# Patient Record
Sex: Male | Born: 1950
Health system: Southern US, Community
[De-identification: ages and names within clinical notes are randomized; demographics above are authoritative.]

## PROBLEM LIST (undated history)

## (undated) DIAGNOSIS — E785 Hyperlipidemia, unspecified: Secondary | ICD-10-CM

## (undated) DIAGNOSIS — R931 Abnormal findings on diagnostic imaging of heart and coronary circulation: Secondary | ICD-10-CM

## (undated) DIAGNOSIS — R001 Bradycardia, unspecified: Secondary | ICD-10-CM

## (undated) DIAGNOSIS — N529 Male erectile dysfunction, unspecified: Secondary | ICD-10-CM

## (undated) DIAGNOSIS — I5022 Chronic systolic (congestive) heart failure: Secondary | ICD-10-CM

## (undated) DIAGNOSIS — I4892 Unspecified atrial flutter: Secondary | ICD-10-CM

## (undated) DIAGNOSIS — I213 ST elevation (STEMI) myocardial infarction of unspecified site: Secondary | ICD-10-CM

## (undated) DIAGNOSIS — I480 Paroxysmal atrial fibrillation: Secondary | ICD-10-CM

## (undated) DIAGNOSIS — I2510 Atherosclerotic heart disease of native coronary artery without angina pectoris: Secondary | ICD-10-CM

## (undated) DIAGNOSIS — I6381 Other cerebral infarction due to occlusion or stenosis of small artery: Secondary | ICD-10-CM

## (undated) DIAGNOSIS — N183 Chronic kidney disease, stage 3 unspecified: Secondary | ICD-10-CM

## (undated) DIAGNOSIS — I491 Atrial premature depolarization: Secondary | ICD-10-CM

## (undated) DIAGNOSIS — R918 Other nonspecific abnormal finding of lung field: Secondary | ICD-10-CM

## (undated) DIAGNOSIS — I10 Essential (primary) hypertension: Secondary | ICD-10-CM

## (undated) DIAGNOSIS — I2109 ST elevation (STEMI) myocardial infarction involving other coronary artery of anterior wall: Secondary | ICD-10-CM

## (undated) DIAGNOSIS — G4733 Obstructive sleep apnea (adult) (pediatric): Secondary | ICD-10-CM

## (undated) HISTORY — DX: Male erectile dysfunction, unspecified: N52.9

## (undated) HISTORY — DX: ST elevation (STEMI) myocardial infarction of unspecified site: I21.3

## (undated) HISTORY — DX: Abnormal findings on diagnostic imaging of heart and coronary circulation: R93.1

## (undated) HISTORY — DX: Hyperlipidemia, unspecified: E78.5

## (undated) HISTORY — DX: Obstructive sleep apnea (adult) (pediatric): G47.33

---

## 1973-02-26 HISTORY — PX: PARTIAL NEPHRECTOMY: SHX414

## 1999-02-27 DIAGNOSIS — I2510 Atherosclerotic heart disease of native coronary artery without angina pectoris: Secondary | ICD-10-CM

## 1999-02-27 HISTORY — PX: CORONARY ANGIOPLASTY WITH STENT PLACEMENT: SHX49

## 1999-02-27 HISTORY — DX: Atherosclerotic heart disease of native coronary artery without angina pectoris: I25.10

## 1999-07-03 ENCOUNTER — Emergency Department (HOSPITAL_COMMUNITY): Admission: EM | Admit: 1999-07-03 | Discharge: 1999-07-03 | Payer: Self-pay | Admitting: Emergency Medicine

## 1999-07-04 ENCOUNTER — Emergency Department (HOSPITAL_COMMUNITY): Admission: EM | Admit: 1999-07-04 | Discharge: 1999-07-04 | Payer: Self-pay | Admitting: Emergency Medicine

## 1999-08-08 ENCOUNTER — Encounter: Payer: Self-pay | Admitting: Cardiovascular Disease

## 1999-08-08 ENCOUNTER — Ambulatory Visit (HOSPITAL_COMMUNITY): Admission: RE | Admit: 1999-08-08 | Discharge: 1999-08-09 | Payer: Self-pay | Admitting: Cardiovascular Disease

## 2000-06-27 ENCOUNTER — Ambulatory Visit (HOSPITAL_COMMUNITY): Admission: RE | Admit: 2000-06-27 | Discharge: 2000-06-27 | Payer: Self-pay | Admitting: Cardiovascular Disease

## 2000-06-27 ENCOUNTER — Encounter: Payer: Self-pay | Admitting: Cardiovascular Disease

## 2001-08-18 ENCOUNTER — Encounter: Payer: Self-pay | Admitting: *Deleted

## 2001-08-18 ENCOUNTER — Inpatient Hospital Stay (HOSPITAL_COMMUNITY): Admission: EM | Admit: 2001-08-18 | Discharge: 2001-08-20 | Payer: Self-pay | Admitting: *Deleted

## 2001-10-06 ENCOUNTER — Encounter: Payer: Self-pay | Admitting: Cardiovascular Disease

## 2001-10-06 ENCOUNTER — Inpatient Hospital Stay (HOSPITAL_COMMUNITY): Admission: EM | Admit: 2001-10-06 | Discharge: 2001-10-07 | Payer: Self-pay | Admitting: Emergency Medicine

## 2002-04-06 ENCOUNTER — Encounter: Payer: Self-pay | Admitting: Orthopedic Surgery

## 2002-04-06 ENCOUNTER — Encounter: Admission: RE | Admit: 2002-04-06 | Discharge: 2002-04-06 | Payer: Self-pay | Admitting: Orthopedic Surgery

## 2002-04-08 ENCOUNTER — Ambulatory Visit (HOSPITAL_BASED_OUTPATIENT_CLINIC_OR_DEPARTMENT_OTHER): Admission: RE | Admit: 2002-04-08 | Discharge: 2002-04-08 | Payer: Self-pay | Admitting: Orthopedic Surgery

## 2002-09-21 ENCOUNTER — Encounter: Payer: Self-pay | Admitting: Internal Medicine

## 2003-09-22 ENCOUNTER — Ambulatory Visit (HOSPITAL_COMMUNITY): Admission: RE | Admit: 2003-09-22 | Discharge: 2003-09-22 | Payer: Self-pay | Admitting: Specialist

## 2005-01-26 ENCOUNTER — Emergency Department (HOSPITAL_COMMUNITY): Admission: EM | Admit: 2005-01-26 | Discharge: 2005-01-26 | Payer: Self-pay | Admitting: Emergency Medicine

## 2005-02-15 ENCOUNTER — Emergency Department (HOSPITAL_COMMUNITY): Admission: EM | Admit: 2005-02-15 | Discharge: 2005-02-15 | Payer: Self-pay | Admitting: Emergency Medicine

## 2005-02-26 HISTORY — PX: CARDIAC CATHETERIZATION: SHX172

## 2005-02-26 HISTORY — PX: BACK SURGERY: SHX140

## 2005-03-14 ENCOUNTER — Encounter: Admission: RE | Admit: 2005-03-14 | Discharge: 2005-03-14 | Payer: Self-pay | Admitting: Cardiovascular Disease

## 2005-03-20 ENCOUNTER — Ambulatory Visit (HOSPITAL_COMMUNITY): Admission: RE | Admit: 2005-03-20 | Discharge: 2005-03-20 | Payer: Self-pay | Admitting: Cardiovascular Disease

## 2005-04-04 ENCOUNTER — Ambulatory Visit (HOSPITAL_COMMUNITY): Admission: RE | Admit: 2005-04-04 | Discharge: 2005-04-05 | Payer: Self-pay | Admitting: Neurosurgery

## 2006-04-23 ENCOUNTER — Ambulatory Visit: Payer: Self-pay | Admitting: Family Medicine

## 2006-04-24 ENCOUNTER — Ambulatory Visit (HOSPITAL_COMMUNITY): Admission: RE | Admit: 2006-04-24 | Discharge: 2006-04-24 | Payer: Self-pay | Admitting: Family Medicine

## 2006-06-04 ENCOUNTER — Ambulatory Visit: Payer: Self-pay | Admitting: Family Medicine

## 2006-06-05 ENCOUNTER — Ambulatory Visit: Payer: Self-pay | Admitting: *Deleted

## 2006-07-05 ENCOUNTER — Ambulatory Visit: Payer: Self-pay | Admitting: Family Medicine

## 2006-07-10 ENCOUNTER — Ambulatory Visit (HOSPITAL_COMMUNITY): Admission: RE | Admit: 2006-07-10 | Discharge: 2006-07-10 | Payer: Self-pay | Admitting: Family Medicine

## 2006-07-29 ENCOUNTER — Ambulatory Visit: Payer: Self-pay | Admitting: Family Medicine

## 2006-11-13 ENCOUNTER — Encounter (INDEPENDENT_AMBULATORY_CARE_PROVIDER_SITE_OTHER): Payer: Self-pay | Admitting: *Deleted

## 2007-02-09 IMAGING — CR DG LUMBAR SPINE COMPLETE 4+V
5 series · 5 of 5 positions shown · non-contrast
Comparison: none

CLINICAL DATA: 53 year-old male low back pain.
 LUMBAR SPINE- 5 VIEW:

[t l-spine a.p.]
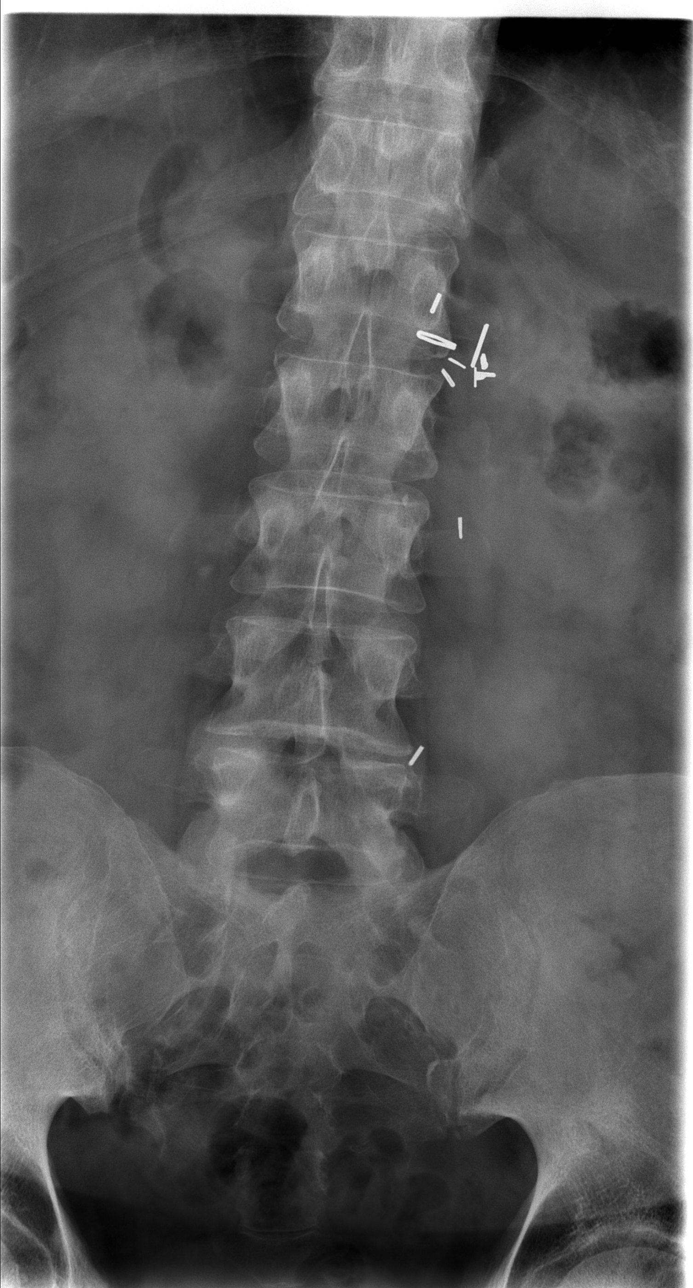

[t l-spine oblique exposure (1 of 2)]
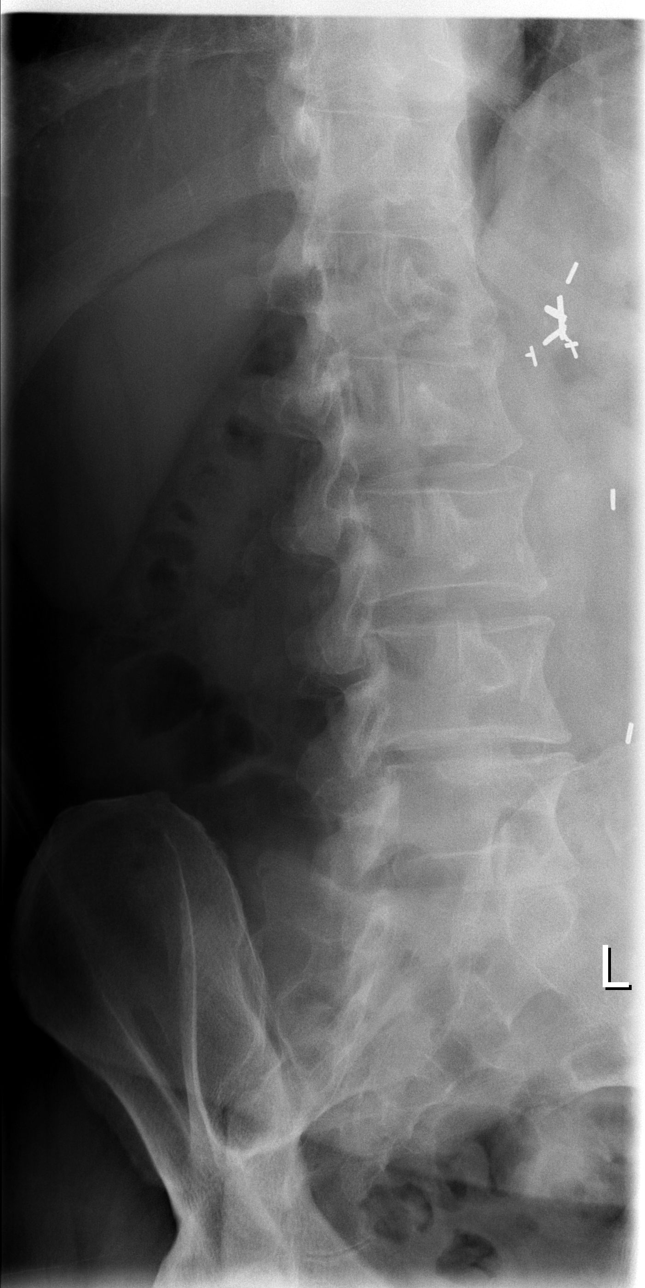

[t l-spine oblique exposure (2 of 2)]
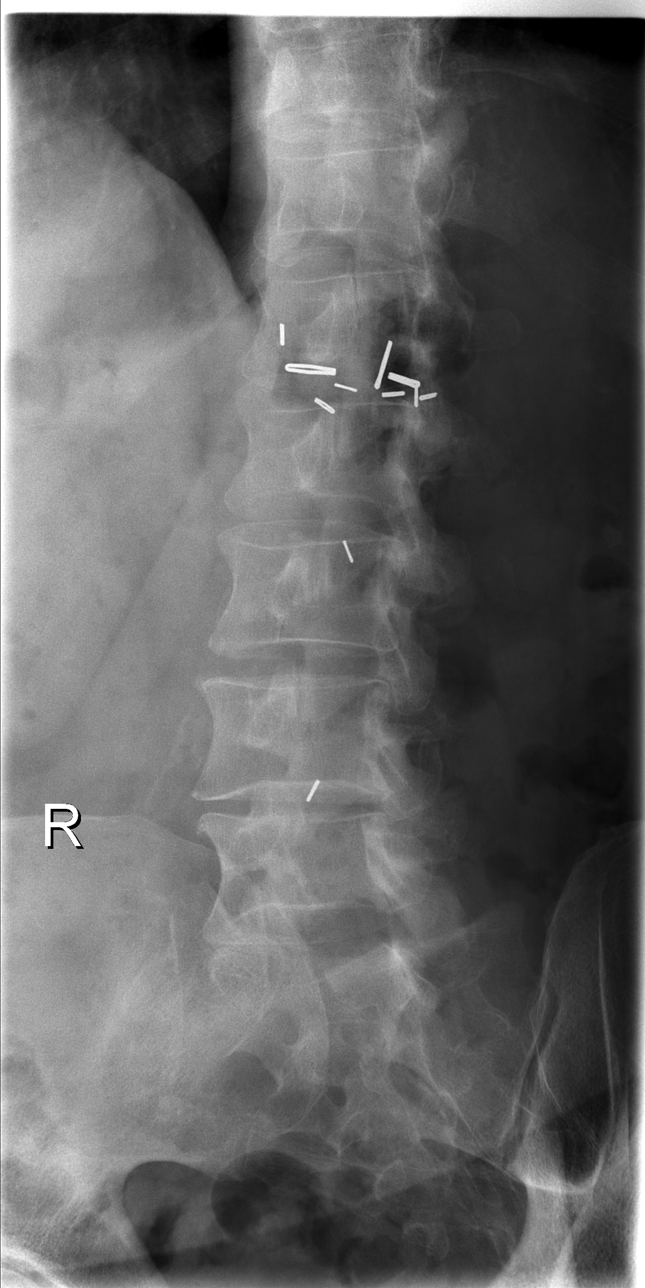

[t l-spine lat]
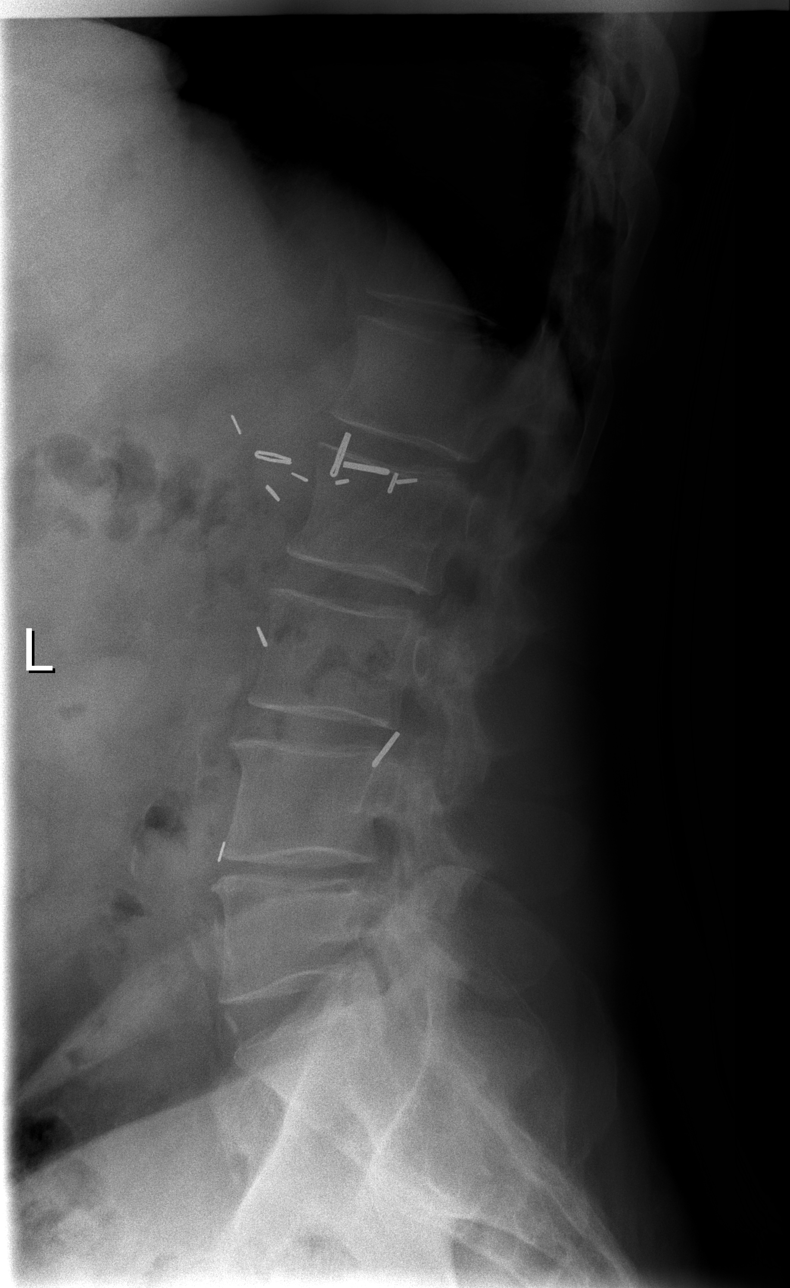

[t l-spine l5-s1 spot]
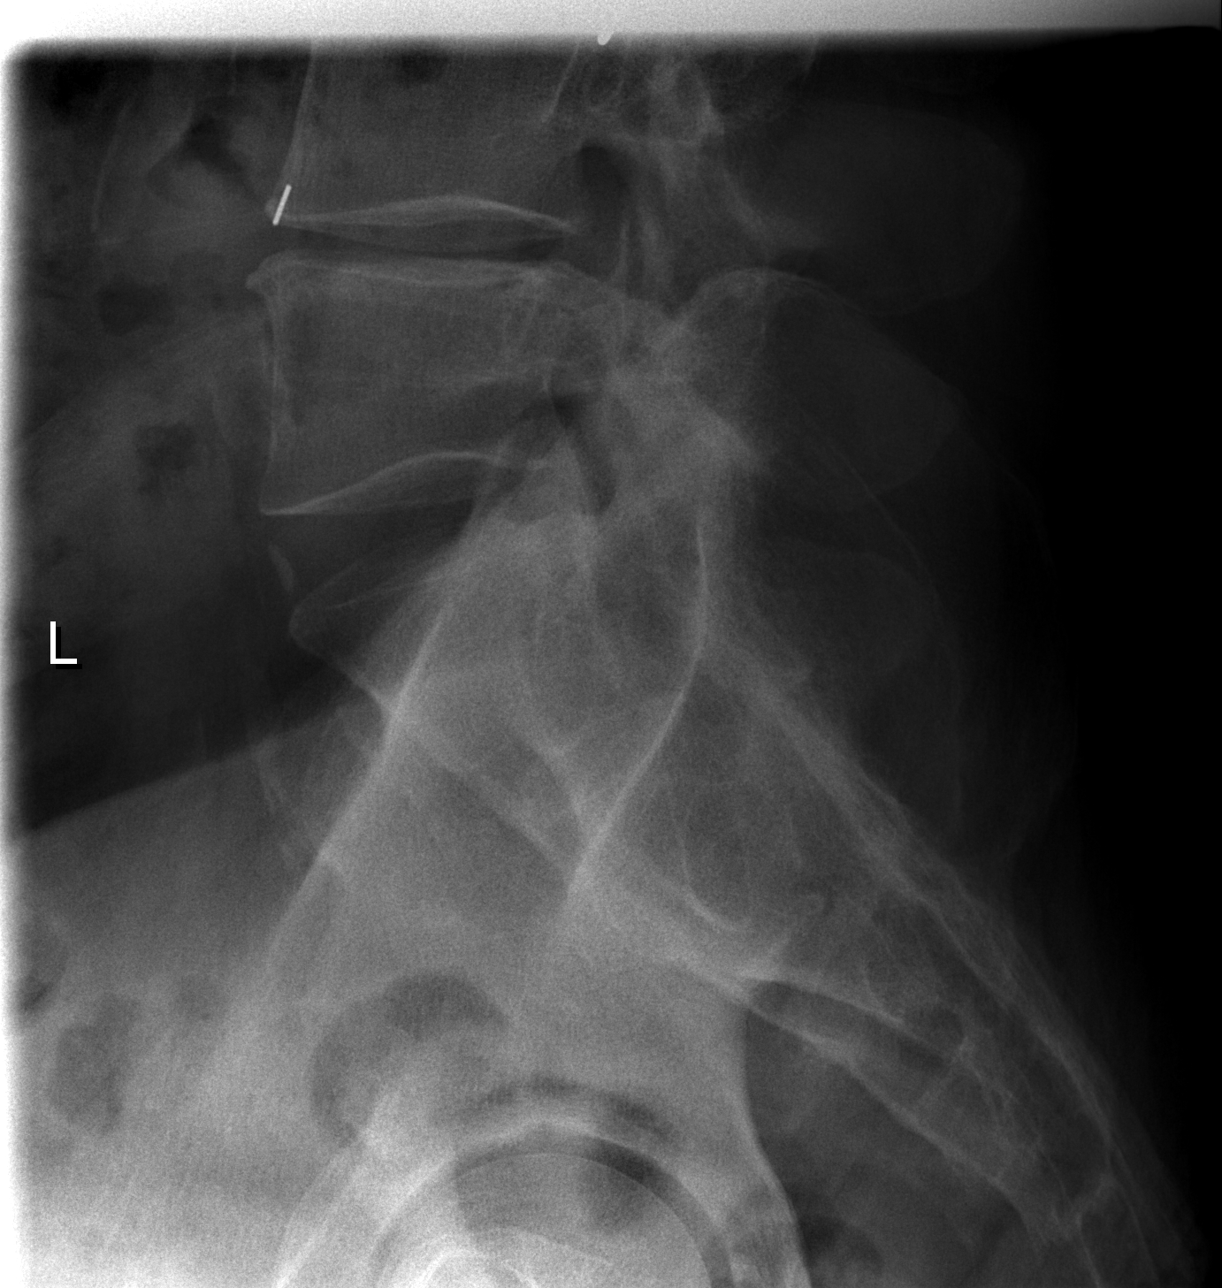

[5 of 5 positions shown; findings below may reference images not displayed]

FINDINGS: Lumbar spine alignment is anatomic. Mild L4-5 and L5-S1 degenerative disk disease. No acute compression fracture or deformity.  Atherosclerosis of the aortoiliac vessels.  No pars defects.  Intact pedicles.  Mild SI joint arthropathy.
IMPRESSION: Degenerative changes as described.  No acute finding by plain radiography.

## 2007-03-28 ENCOUNTER — Ambulatory Visit: Payer: Self-pay | Admitting: Family Medicine

## 2007-03-28 LAB — CONVERTED CEMR LAB
ALT: 36 units/L (ref 0–53)
Albumin: 4.4 g/dL (ref 3.5–5.2)
Alkaline Phosphatase: 82 units/L (ref 39–117)
CO2: 23 meq/L (ref 19–32)
Glucose, Bld: 108 mg/dL — ABNORMAL HIGH (ref 70–99)
LDL Cholesterol: 68 mg/dL (ref 0–99)
Lymphocytes Relative: 20 % (ref 12–46)
Lymphs Abs: 1.8 10*3/uL (ref 0.7–4.0)
Neutrophils Relative %: 65 % (ref 43–77)
Platelets: 257 10*3/uL (ref 150–400)
Potassium: 3.8 meq/L (ref 3.5–5.3)
Sodium: 139 meq/L (ref 135–145)
Total Protein: 7.1 g/dL (ref 6.0–8.3)
Triglycerides: 167 mg/dL — ABNORMAL HIGH (ref <150)
WBC: 9.4 10*3/uL (ref 4.0–10.5)

## 2007-05-30 ENCOUNTER — Ambulatory Visit: Payer: Self-pay | Admitting: Family Medicine

## 2007-06-25 ENCOUNTER — Ambulatory Visit: Payer: Self-pay | Admitting: Internal Medicine

## 2007-06-25 DIAGNOSIS — G4733 Obstructive sleep apnea (adult) (pediatric): Secondary | ICD-10-CM | POA: Insufficient documentation

## 2007-06-25 DIAGNOSIS — I10 Essential (primary) hypertension: Secondary | ICD-10-CM | POA: Insufficient documentation

## 2007-06-25 DIAGNOSIS — E785 Hyperlipidemia, unspecified: Secondary | ICD-10-CM | POA: Insufficient documentation

## 2007-06-25 DIAGNOSIS — G4730 Sleep apnea, unspecified: Secondary | ICD-10-CM

## 2007-06-25 DIAGNOSIS — J309 Allergic rhinitis, unspecified: Secondary | ICD-10-CM | POA: Insufficient documentation

## 2007-06-29 DIAGNOSIS — I2510 Atherosclerotic heart disease of native coronary artery without angina pectoris: Secondary | ICD-10-CM | POA: Insufficient documentation

## 2007-07-01 ENCOUNTER — Encounter: Payer: Self-pay | Admitting: Internal Medicine

## 2007-07-01 ENCOUNTER — Ambulatory Visit (HOSPITAL_BASED_OUTPATIENT_CLINIC_OR_DEPARTMENT_OTHER): Admission: RE | Admit: 2007-07-01 | Discharge: 2007-07-01 | Payer: Self-pay | Admitting: Internal Medicine

## 2007-07-05 ENCOUNTER — Ambulatory Visit: Payer: Self-pay | Admitting: Internal Medicine

## 2007-07-07 ENCOUNTER — Encounter
Admission: RE | Admit: 2007-07-07 | Discharge: 2007-07-10 | Payer: Self-pay | Admitting: Physical Medicine & Rehabilitation

## 2007-07-10 ENCOUNTER — Ambulatory Visit: Payer: Self-pay | Admitting: Physical Medicine & Rehabilitation

## 2007-09-03 ENCOUNTER — Telehealth (INDEPENDENT_AMBULATORY_CARE_PROVIDER_SITE_OTHER): Payer: Self-pay | Admitting: *Deleted

## 2007-09-10 ENCOUNTER — Ambulatory Visit: Payer: Self-pay | Admitting: Internal Medicine

## 2007-09-22 ENCOUNTER — Encounter: Payer: Self-pay | Admitting: Internal Medicine

## 2007-09-30 ENCOUNTER — Ambulatory Visit: Payer: Self-pay | Admitting: Family Medicine

## 2007-10-01 ENCOUNTER — Ambulatory Visit (HOSPITAL_COMMUNITY): Admission: RE | Admit: 2007-10-01 | Discharge: 2007-10-01 | Payer: Self-pay | Admitting: Family Medicine

## 2007-10-10 ENCOUNTER — Ambulatory Visit: Payer: Self-pay | Admitting: Internal Medicine

## 2007-10-30 ENCOUNTER — Encounter: Payer: Self-pay | Admitting: Family Medicine

## 2007-10-30 ENCOUNTER — Ambulatory Visit: Payer: Self-pay | Admitting: Family Medicine

## 2007-10-30 LAB — CONVERTED CEMR LAB
ALT: 38 units/L (ref 0–53)
AST: 27 units/L (ref 0–37)
Albumin: 4.3 g/dL (ref 3.5–5.2)
Alkaline Phosphatase: 67 units/L (ref 39–117)
Cholesterol: 169 mg/dL (ref 0–200)
HDL: 55 mg/dL (ref >39)
LDL Cholesterol: 69 mg/dL (ref 0–99)
Total Protein: 6.9 g/dL (ref 6.0–8.3)
Triglycerides: 227 mg/dL — ABNORMAL HIGH (ref <150)

## 2008-01-27 ENCOUNTER — Emergency Department (HOSPITAL_COMMUNITY): Admission: EM | Admit: 2008-01-27 | Discharge: 2008-01-27 | Payer: Self-pay | Admitting: Emergency Medicine

## 2008-03-12 ENCOUNTER — Ambulatory Visit: Payer: Self-pay | Admitting: Family Medicine

## 2008-03-12 LAB — CONVERTED CEMR LAB
Barbiturate Quant, Ur: NEGATIVE
Cocaine Metabolites: NEGATIVE
Creatinine,U: 38.6 mg/dL
Methadone: NEGATIVE
Opiate Screen, Urine: NEGATIVE
Propoxyphene: NEGATIVE

## 2008-11-05 ENCOUNTER — Ambulatory Visit: Payer: Self-pay | Admitting: Family Medicine

## 2008-11-05 LAB — CONVERTED CEMR LAB: Microalb, Ur: 0.5 mg/dL (ref 0.00–1.89)

## 2009-02-04 ENCOUNTER — Ambulatory Visit: Payer: Self-pay | Admitting: Family Medicine

## 2009-07-29 ENCOUNTER — Ambulatory Visit: Payer: Self-pay | Admitting: Family Medicine

## 2009-07-29 LAB — CONVERTED CEMR LAB
ALT: 59 units/L — ABNORMAL HIGH (ref 0–53)
Alkaline Phosphatase: 70 units/L (ref 39–117)
Barbiturate Quant, Ur: NEGATIVE
Basophils Absolute: 0 10*3/uL (ref 0.0–0.1)
Basophils Relative: 0 % (ref 0–1)
Cholesterol: 182 mg/dL (ref 0–200)
Cocaine Metabolites: NEGATIVE
Creatinine, Ser: 0.96 mg/dL (ref 0.40–1.50)
Creatinine,U: 123.8 mg/dL
Eosinophils Absolute: 0.2 10*3/uL (ref 0.0–0.7)
LDL Cholesterol: 99 mg/dL (ref 0–99)
MCHC: 33.2 g/dL (ref 30.0–36.0)
MCV: 88.4 fL (ref 78.0–100.0)
Neutro Abs: 5.8 10*3/uL (ref 1.7–7.7)
Neutrophils Relative %: 53 % (ref 43–77)
Opiate Screen, Urine: POSITIVE — AB
Phencyclidine (PCP): NEGATIVE
Platelets: 227 10*3/uL (ref 150–400)
Propoxyphene: NEGATIVE
RDW: 13 % (ref 11.5–15.5)
Sodium: 140 meq/L (ref 135–145)
Total Bilirubin: 0.9 mg/dL (ref 0.3–1.2)
Total CHOL/HDL Ratio: 3.8
Total Protein: 7 g/dL (ref 6.0–8.3)
Triglycerides: 176 mg/dL — ABNORMAL HIGH (ref <150)
VLDL: 35 mg/dL (ref 0–40)

## 2009-09-29 ENCOUNTER — Ambulatory Visit: Payer: Self-pay | Admitting: Family Medicine

## 2010-02-27 ENCOUNTER — Inpatient Hospital Stay (HOSPITAL_COMMUNITY)
Admission: EM | Admit: 2010-02-27 | Discharge: 2010-03-05 | Payer: Self-pay | Source: Home / Self Care | Attending: Family Medicine | Admitting: Family Medicine

## 2010-03-01 LAB — CBC
HCT: 32.4 % — ABNORMAL LOW (ref 39.0–52.0)
Hemoglobin: 11.1 g/dL — ABNORMAL LOW (ref 13.0–17.0)
MCH: 30.4 pg (ref 26.0–34.0)
MCHC: 34.3 g/dL (ref 30.0–36.0)
MCV: 88.8 fL (ref 78.0–100.0)
Platelets: 400 10*3/uL (ref 150–400)
RBC: 3.65 MIL/uL — ABNORMAL LOW (ref 4.22–5.81)
RDW: 13.1 % (ref 11.5–15.5)
WBC: 19.9 10*3/uL — ABNORMAL HIGH (ref 4.0–10.5)

## 2010-03-01 LAB — BLOOD GAS, ARTERIAL
Acid-base deficit: 0.4 mmol/L (ref 0.0–2.0)
Bicarbonate: 23.4 mEq/L (ref 20.0–24.0)
Drawn by: 296031
O2 Content: 6 L/min
O2 Saturation: 89.3 %
Patient temperature: 98.6
TCO2: 24.5 mmol/L (ref 0–100)
pCO2 arterial: 36 mmHg (ref 35.0–45.0)
pH, Arterial: 7.428 (ref 7.350–7.450)
pO2, Arterial: 55 mmHg — ABNORMAL LOW (ref 80.0–100.0)

## 2010-03-01 LAB — BASIC METABOLIC PANEL
BUN: 12 mg/dL (ref 6–23)
CO2: 24 mEq/L (ref 19–32)
Calcium: 8.5 mg/dL (ref 8.4–10.5)
Chloride: 106 mEq/L (ref 96–112)
Creatinine, Ser: 1.22 mg/dL (ref 0.4–1.5)
GFR calc Af Amer: >60 mL/min (ref >60)
GFR calc non Af Amer: >60 mL/min (ref >60)
Glucose, Bld: 131 mg/dL — ABNORMAL HIGH (ref 70–99)
Potassium: 3.8 mEq/L (ref 3.5–5.1)
Sodium: 140 mEq/L (ref 135–145)

## 2010-03-01 LAB — DIFFERENTIAL
Basophils Absolute: 0.1 10*3/uL (ref 0.0–0.1)
Basophils Relative: 0 % (ref 0–1)
Eosinophils Absolute: 0.2 10*3/uL (ref 0.0–0.7)
Eosinophils Relative: 1 % (ref 0–5)
Lymphocytes Relative: 12 % (ref 12–46)
Lymphs Abs: 2.5 10*3/uL (ref 0.7–4.0)
Monocytes Absolute: 1.7 10*3/uL — ABNORMAL HIGH (ref 0.1–1.0)
Monocytes Relative: 9 % (ref 3–12)
Neutro Abs: 15.5 10*3/uL — ABNORMAL HIGH (ref 1.7–7.7)
Neutrophils Relative %: 78 % — ABNORMAL HIGH (ref 43–77)

## 2010-03-02 LAB — BASIC METABOLIC PANEL
BUN: 6 mg/dL (ref 6–23)
CO2: 25 mEq/L (ref 19–32)
Calcium: 8.5 mg/dL (ref 8.4–10.5)
Chloride: 104 mEq/L (ref 96–112)
Creatinine, Ser: 1.1 mg/dL (ref 0.4–1.5)
GFR calc Af Amer: >60 mL/min (ref >60)
GFR calc non Af Amer: >60 mL/min (ref >60)
Glucose, Bld: 80 mg/dL (ref 70–99)
Potassium: 3.4 mEq/L — ABNORMAL LOW (ref 3.5–5.1)
Sodium: 138 mEq/L (ref 135–145)

## 2010-03-02 LAB — CBC
HCT: 30.9 % — ABNORMAL LOW (ref 39.0–52.0)
Hemoglobin: 10.5 g/dL — ABNORMAL LOW (ref 13.0–17.0)
MCH: 29.8 pg (ref 26.0–34.0)
MCHC: 34 g/dL (ref 30.0–36.0)
MCV: 87.8 fL (ref 78.0–100.0)
Platelets: 425 10*3/uL — ABNORMAL HIGH (ref 150–400)
RBC: 3.52 MIL/uL — ABNORMAL LOW (ref 4.22–5.81)
RDW: 13.1 % (ref 11.5–15.5)
WBC: 19.2 10*3/uL — ABNORMAL HIGH (ref 4.0–10.5)

## 2010-03-02 LAB — BRAIN NATRIURETIC PEPTIDE: Pro B Natriuretic peptide (BNP): 114 pg/mL — ABNORMAL HIGH (ref 0.0–100.0)

## 2010-03-13 LAB — BASIC METABOLIC PANEL
BUN: 17 mg/dL (ref 6–23)
CO2: 25 mEq/L (ref 19–32)
Calcium: 8.8 mg/dL (ref 8.4–10.5)
Chloride: 101 mEq/L (ref 96–112)
Creatinine, Ser: 1.21 mg/dL (ref 0.4–1.5)
GFR calc Af Amer: >60 mL/min (ref >60)
GFR calc non Af Amer: >60 mL/min (ref >60)
Glucose, Bld: 88 mg/dL (ref 70–99)
Potassium: 3.4 mEq/L — ABNORMAL LOW (ref 3.5–5.1)
Sodium: 137 mEq/L (ref 135–145)

## 2010-03-13 LAB — CBC
HCT: 32.7 % — ABNORMAL LOW (ref 39.0–52.0)
Hemoglobin: 10.6 g/dL — ABNORMAL LOW (ref 13.0–17.0)
MCH: 29 pg (ref 26.0–34.0)
MCHC: 32.4 g/dL (ref 30.0–36.0)
MCV: 89.3 fL (ref 78.0–100.0)
Platelets: 497 10*3/uL — ABNORMAL HIGH (ref 150–400)
RBC: 3.66 MIL/uL — ABNORMAL LOW (ref 4.22–5.81)
RDW: 13.2 % (ref 11.5–15.5)
WBC: 17.9 10*3/uL — ABNORMAL HIGH (ref 4.0–10.5)

## 2010-04-01 LAB — EXPECTORATED SPUTUM ASSESSMENT W GRAM STAIN, RFLX TO RESP C

## 2010-05-08 LAB — BLOOD GAS, ARTERIAL
Bicarbonate: 23.8 mEq/L (ref 20.0–24.0)
O2 Saturation: 82.3 %
Patient temperature: 98.5
TCO2: 24.8 mmol/L (ref 0–100)

## 2010-05-08 LAB — DIFFERENTIAL
Basophils Absolute: 0.1 10*3/uL (ref 0.0–0.1)
Basophils Relative: 0 % (ref 0–1)
Basophils Relative: 0 % (ref 0–1)
Eosinophils Absolute: 0 10*3/uL (ref 0.0–0.7)
Eosinophils Absolute: 0.1 10*3/uL (ref 0.0–0.7)
Eosinophils Relative: 0 % (ref 0–5)
Lymphs Abs: 1.4 10*3/uL (ref 0.7–4.0)
Monocytes Absolute: 2.7 10*3/uL — ABNORMAL HIGH (ref 0.1–1.0)
Monocytes Absolute: 2.7 10*3/uL — ABNORMAL HIGH (ref 0.1–1.0)
Monocytes Relative: 11 % (ref 3–12)
Monocytes Relative: 12 % (ref 3–12)
Neutrophils Relative %: 79 % — ABNORMAL HIGH (ref 43–77)
Neutrophils Relative %: 83 % — ABNORMAL HIGH (ref 43–77)

## 2010-05-08 LAB — COMPREHENSIVE METABOLIC PANEL
ALT: 43 U/L (ref 0–53)
AST: 57 U/L — ABNORMAL HIGH (ref 0–37)
Albumin: 2.8 g/dL — ABNORMAL LOW (ref 3.5–5.2)
Alkaline Phosphatase: 122 U/L — ABNORMAL HIGH (ref 39–117)
Calcium: 8.5 mg/dL (ref 8.4–10.5)
GFR calc Af Amer: 59 mL/min — ABNORMAL LOW (ref >60)
Potassium: 4 mEq/L (ref 3.5–5.1)
Sodium: 133 mEq/L — ABNORMAL LOW (ref 135–145)
Total Protein: 7 g/dL (ref 6.0–8.3)

## 2010-05-08 LAB — BASIC METABOLIC PANEL
BUN: 24 mg/dL — ABNORMAL HIGH (ref 6–23)
CO2: 25 mEq/L (ref 19–32)
Calcium: 8.3 mg/dL — ABNORMAL LOW (ref 8.4–10.5)
Glucose, Bld: 115 mg/dL — ABNORMAL HIGH (ref 70–99)
Sodium: 135 mEq/L (ref 135–145)

## 2010-05-08 LAB — CBC
HCT: 34.4 % — ABNORMAL LOW (ref 39.0–52.0)
Hemoglobin: 11.4 g/dL — ABNORMAL LOW (ref 13.0–17.0)
MCH: 29.7 pg (ref 26.0–34.0)
MCHC: 34.4 g/dL (ref 30.0–36.0)
MCHC: 34.9 g/dL (ref 30.0–36.0)
Platelets: 374 10*3/uL (ref 150–400)
RDW: 12.7 % (ref 11.5–15.5)
RDW: 12.7 % (ref 11.5–15.5)
WBC: 24.2 10*3/uL — ABNORMAL HIGH (ref 4.0–10.5)

## 2010-05-08 LAB — LEGIONELLA ANTIGEN, URINE: Legionella Antigen, Urine: NEGATIVE

## 2010-05-08 LAB — STREP PNEUMONIAE URINARY ANTIGEN: Strep Pneumo Urinary Antigen: NEGATIVE

## 2010-05-08 LAB — MRSA PCR SCREENING: MRSA by PCR: NEGATIVE

## 2010-07-11 NOTE — Procedures (Signed)
NAME:  Brendan Butler, Brendan Butler              ACCOUNT NO.:  [id number]   MEDICAL RECORD NO.:  [id number]          PATIENT TYPE:  OUT   LOCATION:  SLEEP CENTER                 FACILITY:  [facility]   PHYSICIAN:  Clinton D. Maple Hudson, MD, FCCP, FACPDATE OF BIRTH:  05-19-52   DATE OF STUDY:  07/01/2007                            NOCTURNAL POLYSOMNOGRAM   REFERRING PHYSICIAN:  Clinton D. Young, MD, FCCP, FACP   INDICATION FOR STUDY:  Hypersomnia with sleep apnea.   EPWORTH SLEEPINESS SCORE:  13/24, BMI 32, weight 220 pounds, height 5  feet 10 inches, neck 16.5 inches.   MEDICATIONS:  Charted and reviewed.   SLEEP ARCHITECTURE:  Split study protocol.  During the diagnostic phase,  total sleep time was 153 minutes with sleep efficiency 71.5%.  Stage 1  was 24.2%, stage 2 75.8%, stages 3 and REM were absent.  Sleep latency  24 minutes, awake after sleep onset 37 minutes.  Arousal index 24.7.  Bedtime medication included Lunesta.   RESPIRATORY DATA:  Split study protocol.  Apnea/hypopnea index (AHI)  25.9 per hour indicating moderate obstructive sleep apnea/hypopnea  syndrome.  Sixty-six events were scored including 3 obstructive apneas  and 63 hypopneas.  Events were not positional.  CPAP was titrated to 11  CWP, AHI 0 per hour.  He chose a medium ResMed Quattro mask with heated  humidifier.   OXYGEN DATA:  Loud snoring before CPAP with oxygen desaturation to a  nadir of 83%.  After CPAP control, mean oxygen saturation held 94.7% on  room air.   CARDIAC DATA:  Sinus rhythm with occasional PVC.   MOVEMENT-PARASOMNIA:  Periodic limb movement with 10 events, index 3.9  per hour.  Bathroom x2.  He complained of leg and back pains.   IMPRESSIONS-RECOMMENDATIONS:  1. Moderate obstructive sleep apnea/hypopnea syndrome, AHI 25.9 per      hour with nonpositional event, loud snoring, and oxygen      desaturation to a nadir of 83%.  2. Successful CPAP titration to 11 CWP, AHI 0 per hour.  He chose a  medium ResMed Quattro full face mask with heated humidifier.  3. Periodic limb movement with arousal, 3.9 per hour which is mild.  4. He expressed reservation at ability to tolerate the CPAP.  He had      tried it in the past and dislikes having anything on his face.  He      was also concerned about sleep disturbance by leg and back pain.      Clinton D. Maple Hudson, MD, [facility], FACP  Diplomate, Biomedical engineer of Sleep Medicine  Electronically Signed     CDY/MEDQ  D:  07/05/2007 10:06:32  T:  07/05/2007 11:08:17  Job:  [id number]
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
## 2010-07-14 NOTE — Cardiovascular Report (Signed)
NAMEKETIH, GOODIE NO.:  [id number]   MEDICAL RECORD NO.:  [id number]                   PATIENT TYPE:  INP   LOCATION:  4705                                 FACILITY:  [facility]   PHYSICIAN:  Runell Gess, M.D.             DATE OF BIRTH:  [birth year]   DATE OF PROCEDURE:  DATE OF DISCHARGE:  10/07/2001                              CARDIAC CATHETERIZATION   PROCEDURE PERFORMED:  Cardiac catheterization.   INFECTIONS:  The patient is a 60 year old gentleman, status post LAD, PCI  and stenting August 08, 1999. He had IVUS guided re-PCI August 19, 2001, by Dr.  Susa Griffins. He had brachytherapy at that time. He noted chest pain on  his right side yesterday with some residual chest heaviness this morning. He  presented to [facility] __________ IV heparin and nitroglycerin and his  symptoms resolved. He had no acute ECG changes and his enzymes were  negative.  He presents now for diagnostic coronary arteriography.   DESCRIPTION OF PROCEDURE:  The patient was brought to the second floor [facility] in the postabsorptive state.  He was  premedicated with p.o. Valium.  His right groin was prepped and shaved in  the usual sterile fashion.  Then 1% Xylocaine was used for local anesthesia.  A 6 French sheath was inserted into the right femoral artery using standard  Seldinger technique. A 6 French right and left Judkins diagnostic catheter  as well as a 6 French pigtail catheter were used for selective coronary  angiography, left ventriculography, supravalvular aortography looking at the  aortic arch to rule out dissection in the ileo cranial view. Omnipaque dye  was used for the entirety of the case.  Retrograde, aortic, left  ventricular, and pullback pressures were recorded.   HEMODYNAMICS:  1. Aortic systolic pressure 114, diastolic pressure 61.  2. Left ventricular systolic pressure 122 and diastolic pressure 14.   SELECTIVE CORONARY ANGIOGRAPHY:  1. Left main:  Normal.  2. Left anterior descending:  Normal. In particular, the stented,     angioplasty and brachytherapy sites were widely patent.  3. Ramus intermedius branch:  Normal.  4. Left circumflex:  Normal.  5. Right coronary artery:  Large, dominant, and at most 20% segmental mid     stenosis.   LEFT VENTRICULOGRAPHY:  RAO left ventriculogram was performed using 25 cc  of Omnipaque dye at 12 cc/sec. The overall LVEF was estimated at greater  than 60% without focal wall motion abnormalities.   SUPRAVALVULAR AORTOGRAPHY:  Performed in the LAO cranial view using 20 cc of  Omnipaque dye at 12 cc/sec.  There was no evidence of aortic dissection. The  arch vessels were intact.   IMPRESSION:  The patient has essentially normal coronary arteries and normal  left ventricular function. I am unclear about the etiology of his chest  pain, but I suspect it is noncoronary.  An ACT was measured and the sheaths were removed.  Pressure was held on the  groin to achieve hemostasis.  The patient left the lab in stable condition.  Plans will be to continue medical therapy. He will be discharged home in the  morning. He left the lab in stable condition.                                                Runell Gess, M.D.    JJB/MEDQ  D:  10/06/2001  T:  10/10/2001  Job:  [id number]   cc:   Cardiac Catheterization Laboratory   Olene Craven, M.D.

## 2010-07-14 NOTE — Cardiovascular Report (Signed)
[facility]  Patient:    Brendan Butler, Brendan Butler Visit Number: [id number] MRN: [id number]          Service Type: MED Location: [phone] Attending Physician:  Berry, Jonathan [place] Dictated by:   Pearletha Furl Alanda Amass, M.D. Proc. Date: 08/19/01 Admit Date:  08/18/2001 Discharge Date: 08/20/2001   CC:         Kern Reap, M.D.  Runell Gess, M.D.  CP Lab   Cardiac Catheterization  PROCEDURE:  Intravascular radiation with [facility] P32 brachytherapy system status post cutting balloon atherectomy for in-stent restenosis.  CARDIOLOGIST:  Richard A. Alanda Amass, M.D.  DESCRIPTION OF PROCEDURE:  Please refer to complete procedure report dictated earlier.  The patient had successful cutting balloon atherectomy with a 3.0 x 6 mm cutting balloon upgraded to a 3.25 x 6 for in-stent restenosis documented by IVUS interrogation.  The stent was beautifully positioned just beyond the large diagonal branch but had 80 to 85% in-stent restenosis that was symptomatic, the patient being admitted with recurrent angina.  Initial stenting procedure was done August 08, 1999, with 3.0/8 ACS Tetra stent.  IVUS interrogation post cutting balloon showed an excellent result.  We then had a short delay for radiation oncology.  At this point, ACT was 290 seconds.  The patient received Aggrastat drip.  He had previously received Plavix and is allergic to ASPIRIN.  Intermittent bolus heparin, a total of 3500 units, was used during the cutting balloon procedure.  The side arm sheath was upgraded to a 7-French short sheath.  The LAD was intubated with a JR4 7-French guide, and the lesion was recrossed with a high-torque floppy 0.014 inch guidewire.  A Galileo 3.0/32 mm P32 centering balloon brachytherapy catheter was then positioned across the stent and lesion covering this.  Under direction of Dr. Kathrin Greathouse, a total dose of 20 Gy was delivered with a dwell time  of 105 seconds with the centering balloon inflated to 3 to 4 atmospheres.  The patient tolerated this well.  The balloon was deflated and the catheter removed after the source was successfully removed from the patient.  The lesion was again interrogated with IVUS with the 3.2 mm Atlantis IVUS catheter, and this demonstrated excellent angiographic result with diameter of 3.2 mm in a cross-sectional area of 8.7 mm within the stent and no dissection.  The patient tolerated the procedure well.  He did have pinching of his moderate size septal perforator from the mid portion of the stent, but it had antegrade flow.  Side arm sheath was flushed after dilatation system and brachytherapy system was removed and secured to the skin.  He was transferred to the holding area for ACT measurement and sheath removal with pressure hemostasis when appropriate.  He will be continued on Aggrastat for 18 hours, continued on Plavix relatively long term post brachytherapy and medical therapy for his associated problems. Dictated by:   Pearletha Furl Alanda Amass, M.D. Attending Physician:  Berry, Jonathan [place] DD:  08/19/01 TD:  08/20/01 Job: [id number]

## 2010-07-14 NOTE — Consult Note (Signed)
NAME:  Brendan Butler, ELK NO.:  [id number]   MEDICAL RECORD NO.:  [id number]          PATIENT TYPE:  EMS   LOCATION:  MAJO                         FACILITY:  [facility]   PHYSICIAN:  Donalee Citrin, M.D.        DATE OF BIRTH:  1950-11-28   DATE OF CONSULTATION:  02/15/2005  DATE OF DISCHARGE:  02/15/2005                                   CONSULTATION   REASON FOR CONSULTATION:  Low back pain with right leg radiculopathy.   HISTORY OF PRESENT ILLNESS:  The patient is a very pleasant 60 year old  gentleman who sustained a work-related injury back on the 13th of November.  He was lifting some boxes, experienced immediate pop in his box and over the  last four to six weeks he has had progressive worsening, predominately right  hip pain radiating down the back of his right leg to the top of his foot  with numbness, tingling in the same distribution. The patient does not note  any bowel or bladder difficulty or weakness in his foot or foot drag. He  denies any left leg symptoms. He has been out of work ever since then,  managing his pain on pain medications and muscle relaxers.   PAST MEDICAL HISTORY:  Unremarkable. He has had a little bit of chronic back  pain.   PAST SURGICAL HISTORY:  Noncontributory.   PHYSICAL EXAMINATION:  GENERAL: The patient is a very pleasant, awake, and  alert 60 year old gentleman in no acute distress.  HEENT: Within normal limits.  NEUROLOGIC: Lower extremity strength is 5/5 in his iliopsoas, quads,  hamstrings, gastrocs. He has slight weakness of his right EHL at about 4+/5.  He has slight decreased sensation in the L5 distribution.   His MRI scan shows a disk herniation at L4-5 compressing the thecal sac and  right L5 nerve root. This  does correlate with his clinical syndrome. I do  think this is probably going to require surgical intervention; however, at  this point I am going to treat him on IV steroids, pain medications, muscle  relaxers, and  keeping him out of work over the next week or so to see how  the steroids affect him and he has not been through an adequate trial of  steroid treatment or physical therapy. We will continue this. I will see him  back in two weeks. If he is no better, then I will decide on location in the  fragment and the numbness and slight weakness in his EHL, then he will  probably require surgical intervention. I will make that decision in two  weeks and see him back in the clinic.           ______________________________  Donalee Citrin, M.D.     GC/MEDQ  D:  02/15/2005  T:  02/18/2005  Job:  [id number]

## 2010-07-14 NOTE — Discharge Summary (Signed)
NAME:  Brendan Butler, NOREM                          ACCOUNT NO.:  [id number]   MEDICAL RECORD NO.:  [id number]                   PATIENT TYPE:  INP   LOCATION:  4705                                 FACILITY:  [facility]   PHYSICIAN:  Beverly A. Delanna Ahmadi, N.P.               DATE OF BIRTH:  1950/12/11   DATE OF ADMISSION:  10/06/2001  DATE OF DISCHARGE:  10/07/2001                                 DISCHARGE SUMMARY   HISTORY OF PRESENT ILLNESS:  The patient is a 60 year old white male patient  with a history of coronary artery disease.  He had an LAD stent placed June  2001.  He had a relook May 2002 which was okay.  He had in-stent restenosis  treated with cutting balloon and brachytherapy on 08/19/2001.  Since  discharge, he has apparently had some fatigue, shortness of breath, and  recurrence of chest pain.  The day before admission, he noted some severe  chest pain at rest with shortness of breath.  No nausea, vomiting, or  diaphoresis.  He took 2 nitroglycerin with relief.  On the morning of  admission, he had more pain and had a soreness in his chest also.  He was  seen in the emergency room by [facility] and Dr. Yates Decamp who decided to  admit him.  The patient was to undergo cardiac catheterization which was  performed on 10/06/2001 by Dr. Nanetta Batty.  The patient had normal  coronary arteries.  His LAD stent and brachytherapy sites looked beautiful  per Dr. Nanetta Batty.  the patient's LV was normal; aortic root was normal  without dissection.  Thus, it was decided the patient had noncardiac chest  pain.   The patient was seen by Dr.Weintraub on 10/07/2001 who noted the CKs had been  elevated but negative MB.  The patient had been working out in the hot sun  on the day before he came into the emergency room, thus accounting for his  elevated CK. He was discharged home in stable condition.  His blood pressure  was 109/68, pulse 73, respirations 20, temperature 97.9.   LABORATORY DATA:   Hemoglobin 12.6, hematocrit 37.6, WBC 7.7, platelets 277.  Potassium 3.8, BUN 12, creatinine 0.8.  Sodium 135.  CK 10/44.  MB was only  3.7.   Chest x-ray showed no active disease.   DISCHARGE MEDICATIONS:  Same medicines he was on at home.  1. Altace 2.5 mg every day.  2. Zocor 20 mg at bedtime every day.  3. Cardia 10 mg once per day.  4. Plavix 75 mg once per day.  5. Klonopin 0.5 mg twice per day.  6. Norvasc 5 mg once per day.  7. Percocet 30 mg once per day.  8. Paxil 20 mg once per day.  9. Nitroglycerin under tongue as needed for chest pain.  10.      Claritin 7 mg once a  day for his symptoms for approximately 7 days.     He was given samples from the office.   ACTIVITY:  No driving, no lifting of greater than 5 pounds, no straining or  physical activity for three days post catheterization.  He may resume back  to work in one week.   WOUND CARE:  If he has any problems with his groin, he will give the office  a call.   FOLLOW UP:  He has an appointment to see Dr. Allyson Sabal on 10/30/2001 at 11:15 a.m.    DISCHARGE DIAGNOSES:  1. Chest pain, noncardiac status post cardiac catheterization with no     restenosis of his left anterior descending artery stent.  2. Normal left ventricular function.  3. Hyperlipidemia.  4. Hypertension.  5. History of anxiety.  6. Has been on aspirin and Plavix.                                               Beverly A. Delanna Ahmadi, N.P.    BAB/MEDQ  D:  10/07/2001  T:  10/10/2001  Job:  [id number]

## 2010-07-14 NOTE — Cardiovascular Report (Signed)
[place]. [facility]  Patient:    Brendan Butler, Brendan Butler                       MRN: [id number] Proc. Date: 06/27/00 Adm. Date:  [id number] Disc. Date: [id number] Attending:  Berry, Jonathan [place] CC:         Cardiac Catheterization Laboratory  Brendan Butler, M.D.  [facility], [address], [place], [place] [address]   Cardiac Catheterization  PROCEDURES PERFORMED:  Cardiac catheterization.  INDICATIONS:  Brendan Butler is a 60 year old, white male, who had PTCA and stenting of his mid LAD in June of 2001.  Symptoms were chest pain and dyspnea with normal LV function.  The symptoms resolved.  I last saw him on April 10 after having complained of several episodes of nitroglycerin responsive chest pain and shortness of breath.  He had a Cardiolite stress test performed which showed subtle anteroapical ischemia.  He presents now for diagnostic coronary arteriography.  DESCRIPTION OF PROCEDURE:  The patient was brought to the second floor [facility] in the postabsorptive state.  He was premedicated with p.o. Valium.  His right groin was prepped and shaved in the usual sterile fashion.  Xylocaine 1% was used for local anesthesia.  A 6 French sheath was inserted into the right femoral artery using standard Seldinger technique.  A 6 French right and left Judkins diagnostic catheter as well as a 6 French pigtail catheter were used for selective coronary angiography, left ventriculography, and distal aortography.  Omnipaque dye was used for the entirety of the case.  Retrograde, aortic, left ventricular, and pullback pressures were recorded.  HEMODYNAMICS: 1. Aortic systolic pressure 121, diastolic pressure 69. 2. Left ventricular systolic pressure 121 and diastolic pressure 18.  SELECTIVE CORONARY ANGIOGRAPHY: 1. Left main:  Normal. 2. Left anterior descending:  The LAD revealed a 20-30% in-stent  re-stenosis    within the mid LAD stent and to the first diagonal branch. 3. Ramus intermedius branch:  Normal. 4. Left circumflex:  Nondominant and normal. 5. Right coronary artery:  Right coronary artery was dominant and normal.  LEFT VENTRICULOGRAPHY:  The RAO left ventriculogram was performed using 20 cc of Omnipaque dye, 10 cc per second.  The overall LV ejection fraction was noted to be approximately 35% with global hypokinesia.  DISTAL ABDOMINAL AORTOGRAPHY:  Distal abdominal aortogram was performed using 25 cc of Omnipaque dye at 12 cc/sec.  The overall EF was estimated at greater than 60% without focal wall motion abnormalities.  DISTAL AORTOGRAM:  Distal abdominal aortogram was performed using 20 cc of Omnipaque dye at 20 cc/sec. There was an absolute left renal artery which was an old finding, status post nephrectomy.  The right renal arteries were accessory and were normal.  The infrarenal abdominal aorta and iliac bifurcation appear of significant atherosclerotic changes.  IMPRESSION:  Brendan Butler has a widely patent mid left anterior descending stent with at most 20-30% in-stent re-stenosis.  I am not convinced that this is hemodynamically significant nor the cause of symptoms.  The sheath was removed and pressure was held on the groin to achieve hemostasis.  The patient left the lab in stable condition.  He will be discharged home today as an outpatient and will see me back in the office in approximately two weeks.  He left the lab in stable condition. DD:  06/27/00 TD:  06/28/00 Job: [id number]

## 2010-07-14 NOTE — Discharge Summary (Signed)
[place]. [facility]  Patient:    Brendan Butler, Brendan Butler                       MRN: [id number] Adm. Date:  [id number] Disc. Date: [id number] Attending:  Berry, Jonathan [place]az[place] Dictator:   Marya Fossa, P.A. CC:         Runell Gess, M.D.             Reuben Likes, M.D.                           Discharge Summary  DATE OF BIRTH:  Apr 20, 1950  ADMISSION DIAGNOSES: 1. Abnormal Cardiolite revealing anterolateral ischemia. 2. Chest pain and syncope. 3. Ongoing tobacco abuse. 4. History of nonsustained ventricular tachycardia. 5. Family history of coronary artery disease.  DISCHARGE DIAGNOSES: 1. Status post cardiac catheterization revealing single vessel left anterior    descending disease, status post percutaneous coronary intervention. 2. Abnormal Cardiolite revealing anterolateral ischemia. 3. Chest pain and syncope. 4. Ongoing tobacco abuse. 5. History of nonsustained ventricular tachycardia. 6. Family history of coronary artery disease.  HISTORY OF PRESENT ILLNESS:  Brendan Butler is a 60 year old white male with positive risk factors including family history, tobacco abuse, and several episodes of syncope and chest pain.  He was referred for exercise stress testing, which revealed no ischemic changes; however, he did have episodes of NSVT.  He had a Cardiolite stress test which showed subtle anterolateral ischemia.  He had a 2-D echo which revealed normal LV function and size.  He had a Holter monitor which was performed, but the results are still pending at the time of the dictation.  He also had blood work to look for electrolyte and magnesium abnormalities.  There were no significant findings.  The patient has several risk factors and, in the setting of a positive Cardiolite, we will plan cardiac catheterization with possible intervention. He does have an allergy to aspirin and will be placed on Plavix.  PROCEDURES:  Cardiac catheterization  August 08, 1999, by Dr. Nanetta Batty with PCI.  COMPLICATIONS:  None.  CONSULTATIONS:  None.  HOSPITAL COURSE:  The patient was admitted as an outpatient for cardiac catheterization.  Precatheterization laboratories were within normal limits.  Dr. Nanetta Batty performed cardiac catheterization August 08, 1999, which revealed a normal left main, 70% LAD after the first diagonal, normal circumflex, normal intermedius ramus, and normal right coronary artery with PDA and PLA.  Normal LV function.  LIMA okay.  Distal aortography revealed a 100% right renal artery status post nephrectomy and patent left.  Distal aortography normal.  Dr. Allyson Sabal proceeded with percutaneous intervention to the LAD lesion, IVUS guided with predilatation to 2.5 and stenting with a 3.0 tetra.  He reduced the lesion, which was found to then be 90% after IVUS to 0%.  Integrilin was bolused and infused, and the patient tolerated the procedure well.  Postcatheterization, there were no problems with the groin.  He was discharged to home August 09, 1999, in stable condition.  DISCHARGE MEDICATIONS: 1. Paxil 20 mg a day. 2. Klonopin 0.5 mg t.i.d. 3. Plavix 75 mg a day. 4. Xanax 0.25 mg t.i.d. as needed. 5. Lipitor 10 mg a day. 6. Nitroglycerin as needed for chest pain.  ACTIVITY:  The patient should do no strenuous activity, lifting more than five pounds, or driving for two days.  DIET:  He should follow a low fat,  low cholesterol, low salt diet.  WOUND CARE:  He may shower but should not soak in the tub for a week.  DISCHARGE INSTRUCTIONS:  He has been asked to stop smoking, and he will discuss with Dr. Allyson Sabal if he needs help quitting.  FOLLOW-UP:  He has an appointment set up with Dr. Allyson Sabal for August 31, 1999, at 2:30. DD:  08/09/99 TD:  08/12/99 Job: [id number]

## 2010-07-14 NOTE — Op Note (Signed)
NAME:  Brendan Butler, Brendan Butler                        ACCOUNT NO.:  [id number]   MEDICAL RECORD NO.:  [id number]                   PATIENT TYPE:  AMB   LOCATION:  DSC                                  FACILITY:  [facility]   PHYSICIAN:  Harvie Junior, M.D.                DATE OF BIRTH:  1950/06/01   DATE OF PROCEDURE:  04/08/2002  DATE OF DISCHARGE:                                 OPERATIVE REPORT   PREOPERATIVE DIAGNOSIS:  Impingement with acromioclavicular joint arthritis  with retained screws.   POSTOPERATIVE DIAGNOSIS:  1. Impingement with acromioclavicular joint arthritis with retained screws.  2. Superior labral tear.   OPERATION PERFORMED:  1. Subacromial decompression.  2. Distal clavicle excision.  3. Debridement of labral tear from within the glenohumeral joint.   SURGEON:  Harvie Junior, M.D.   ASSISTANT:  Marshia Ly, P.A.   ANESTHESIA:  General.   INDICATIONS FOR PROCEDURE:  The patient is a 60 year old male with a long  history of having had a procedure performed on her shoulder some 20 years  ago.  He had some screws placed with some in the greater tuberosity.  He has  had recent injury where he has been having significant impingement and  problems moving the arm up overhead.  Because of complaints of pain and  problems and significant impingement findings, the patient was evaluated and  noted to have impingement and acromioclavicular joint arthritis.  He was  brought to the operating room for this procedure.   DESCRIPTION OF PROCEDURE:  The patient was brought to the operating room and  after adequate anesthesia was obtained with a general anesthetic, the  patient was placed supine on the operating table.  He was then moved to the  beach chair position and all bony prominences were well padded.  Attention  was turned to the left shoulder which was prepped and draped in the usual  sterile fashion.  Following this, routine examination of the glenohumeral  joint  revealed that there was some labral fray on the anterior and superior  labrum; this was debrided from the glenohumeral joint.   Attention was turned to the rotator cuff which looked on back of the  infraspinatus to be well seated.  At the supraspinatus there was some  partial thickness tearing.  This was debrided from within the glenohumeral  joint, did not appear to be full thickness but was marked with a suture for  evaluation superiorly.  At this time attention was turned into the  subacromial space out of the glenohumeral joint.  The subacromial space  showed to have a quite dramatic impingement both lateral and anterior.  This  was initially an Arthrex wand was used to remove all the periosteum from the  undersurface of the acromion and to take down some of the suture portions of  the deltoid to the lateral acromion.  Once this was taken down, attention  was turned towards the acromion where an anterior and lateral acromioplasty  was performed from the lateral compartment as well as from the posterior  compartment.  A cutting block technique was used.   Following this, attention was turned towards the distal clavicle where about  2 cm of distal clavicle was excised from both the posterior and anterior  portals.  Following this, attention was turned toward the rotator cuff.  From superiorly there was some partial thickness tearing which was debrided  with a suction shaver.  The screws were identified but were well fixed and  felt that these could be left.  The remaining bursal tissue was debrided  from within the glenohumeral joint as well as all excess remnants of bone  and bony products.  At this point, the shoulder was copiously irrigated and  suctioned dry.  A final check was made to make sure there was no evidence of  impingement and finding none, the shoulder was copiously irrigated and  suctioned dry.  The portals were closed with a bandage.  A sterile  compressive dressing was  applied.  The patient was taken to the recovery  room where she was noted to be in satisfactory condition.  The estimated  blood loss for this procedure was none.                                                Harvie Junior, M.D.    Ranae Plumber  D:  04/08/2002  T:  04/08/2002  Job:  [id number]

## 2010-07-14 NOTE — Cardiovascular Report (Signed)
[place]. [facility]  Patient:    Brendan Butler, Brendan Butler                       MRN: [id number] Proc. Date: 08/08/99 Adm. Date:  [id number] Disc. Date: [id number] Attending:  Berry, Jonathan [place]az[place] CC:         [facility]             [address], [place], [place]             Patients chart, CC Lab             Reuben Likes, M.D.                        Cardiac Catheterization  PROCEDURE:  Cardiac catheterization and stent placement.  INDICATION:  Brendan Butler is a 60 year old white male with positive risk factors including family history and tobacco abuse, who I initially saw for syncope and chest pain.  Exercise stress testing that was significant for nonsustained ventricular tachycardia.  A cardiac stress test showed subtle anterolateral ischemia.  A 2-D echocardiogram revealed normal LV function.  He presents now for diagnostic coronary arteriography.  DESCRIPTION OF PROCEDURE:  The patient was brought to the second floor [facility] in the postabsorbtive state.  He was premedicated with p.o. Valium.  His right groin is prepped and shaved in the usual sterile fashion.  Xylocaine 1% was used for local anesthesia.  A 6 French sheath was inserted into the right femoral artery using standard Seldinger technique.  A 6 French right and left Judkins catheter, following 6 French pigtail catheter were used for septal coronary angiography, left ventriculography, subselective left internal mammary artery angiography, and distal abdominal aortography.  Omnipaque dye was used for the entirety of the diagnostic case.  Retrograde aorta, ventricular pullback pressures were recorded.  HEMODYNAMICS: 1. Aortic systolic pressure 150, diastolic pressure 87. 2. Left ventricular systolic pressure 150, diastolic pressure 17.  SELECTIVE CORONARY ANGIOGRAPHY: 1. Left main:  Normal. 2. LAD:  The LAD had a 50-70% angiographic  stenosis at the portion of the    takeoff of large first diagonal branch in the midportion. 3. Left circumflex:  This vessel was nondominant and was free of significant    disease. 4. Ramus intermedius branch:  This was a moderate size vessel and was free    of significant disease. 5. Right coronary artery:  This vessel was a dominant vessel and was free of    significant disease. 6. Left ventriculography:  RAO left ventriculogram was performed using 20 cc    of Omnipaque dye at 10 cc per second.  The overall LV/EF was estimated at    greater than 60% without focal wall motion abnormalities. 7. Left internal mammary artery:  This vessel was subselectively visualized    and was widely patent.  It was suitable for use during coronary artery    bypass grafting. 8. Distal abdominal aortography:  Distal abdominal aortogram was performed    using 20 cc of Omnipaque dye at 20 cc per second.  The left renal artery    was absent since the patient had nephrectomy in the past.  The right    renal artery was widely patent and actually had an accessory artery.  The    infrarenal abdominal aorta and the iliac bifurcation appear free of    significant atherosclerotic changes.  IMPRESSION:  Brendan Butler has a moderate mid-LAD lesion after the first diagonal branch.  I suspect this is tighter by ibis and probably physiologically significant contributing to the anterolateral perfusion abnormality on cardiac stress testing as well as the patients symptoms of chest pain and presyncope, most likely related to an arrhythmogenic cause.  We will proceed with ibis-guided PCI.  DESCRIPTION OF PROCEDURE:  The existing 6 French sheath in the right femoral artery was exchanged for a wire for a 7 [place] sheath.  The 6 [place] was then inserted into the right femoral vein.  Using a 7 [place] guide catheter along with an OM4 190 support wire and a 04/02/08 Maverick balloon, as well as an Atlantis ibis catheter,  ibis-guided PCI was performed.  The distal reference segment was approximately 3.1 mm with occlusive plaque just after the first diagonal branch.  This did reproduce the patients chest pain.  He received 200 mcg of intracoronary nitroglycerin after this.  Then 4000 units of heparin were administered with an ACT of 260.  He received Integrilin double bolus infusion.  He was on Plavix and he was allergic to aspirin.  Predilitation was performed with a 2/5 Maverick and stenting was performed with a 3.0/8 mm guidant Tetra stent at 10 atmospheres resulting in reduction of a 50-70% angiographic lesion with 0% residual.  There was very mild encroachment on the diagonal branch; however, this did not appear to be hemodynamically significant.  The patient tolerated the procedure well.  OVERALL IMPRESSION:  Successful ibis-guided PCI and stenting using Integrilin and heparin with a favorable angiographic result on the mid-LAD lesion.  The sheaths were sewn securely in place.  The guide catheter was removed.  The patient left the lab in stable condition.  The sheaths will be removed once the patient ACT falls below 150 and Integrilin will be continued for 18 hours.  He will remain on Plavix indefinitely.  Dr. Reuben Likes was notified of these results. DD:  08/08/99 TD:  08/11/99 Job: [id number]
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
## 2010-07-14 NOTE — Discharge Summary (Signed)
[place]  Patient:    Brendan Butler, FEHL Visit Number: [id number] MRN: [id number]          Service Type: MED Location: [phone] Attending Physician:  Berry, Jonathan [place] Dictated by:   Abelino Derrick, P.A.C. Admit Date:  08/18/2001 Discharge Date: 08/20/2001   CC:         Kern Reap, M.D.   Discharge Summary  DISCHARGE DIAGNOSES: 1. Percutaneous coronary intervention with cutting balloon and brachytherapy    for in stent restenosis left anterior descending this admission. 2. History of left anterior descending stenting June 2001. 3. Normal left ventricular function. 4. Hyperlipidemia. 5. Hypertension. 6. Family history coronary disease. 7. History of left nephrectomy, renal function has been normal.  HOSPITAL COURSE:  The patient is a 60 year old male who initially underwent left anterior descending intervention June of 2001.  He was restudied in May of 2002 and was patent.  He has hypertension, family history of coronary artery disease and hyperlipidemia.  He was admitted on August 18, 2001 with unstable angina.  He was seen initially by Dr. Lenise Herald.  He was started on heparin, nitrates and set up for catheterization.  The patient does have an ASPIRIN allergy, gets hives with aspirin. He is on chronic Plavix. Catheterization was done on August 19, 2001 by Dr. Alanda Amass which revealed essentially single vessel disease with an 80% in stent restenosis to the stent site of the left anterior descending.  IVUS was performed by Dr. Alanda Amass. The patient underwent cutting balloon intervention with brachytherapy with good results.  The patient was stable postoperatively.  We feel the patient can be discharged on August 20, 2001.  We had a long talk with the patient regarding medications.  Apparently recently the patient has separated and has trouble with affording his co-pays for his medicines.  We had suggested he start Zetia a few  weeks ago and add this to his Zocor 20 but he could not afford it.  He could not tolerate a higher dose of Zocor because of myalgias.  We will arrange for him to get Zetia samples at the office.  We also added Toprol XL 25 mg.  He has been taking Prevacid but also could not afford this and we will see if we can get samples of this also.  DISCHARGE MEDICATIONS: 1. Zocor 20 mg q.d. 2. Zetia 10 mg q.d. 3. Plavix 75 mg q.d. 4. Klonopin 0.5 mg b.i.d. 5. Paxil 20 mg q.d. 6. Norvasc 5 mg q.d. 7. Toprol XL 25 mg q.d. (On hold, see below) 8. Nitroglycerin sublingual p.r.n. 9. Prevacid 30 mg q.d.  LABORATORY DATA:  Sodium 139, potassium 3.3, BUN 9, creatinine 0.9. CK-MB and troponin levels are negative X3.  White blood cell count 9.1, hemoglobin 12.1, hematocrit 35.7, platelet count 254,000. INR 1.0.  Chest x-ray showed borderline cardiomegaly.  Electrocardiogram: Sinus rhythm, sinus bradycardia on admission.  DISPOSITION:  Patient is discharged in stable condition and will have follow up with Dr. Allyson Sabal.  On review at discharge he has been sinus bradycardic during this admission even before he received Toprol and will hold off on Toprol, he probably will not be able to afford this anyway.  We will have the patient go to the office for samples today. Dictated by:   Abelino Derrick, P.A.C. Attending Physician:  Berry, Jonathan [place] DD:  08/20/01 TD:  08/20/01 Job: [id number]

## 2010-07-14 NOTE — Cardiovascular Report (Signed)
NAME:  Brendan Butler, MANGEL NO.:  [id number]   MEDICAL RECORD NO.:  [id number]          PATIENT TYPE:  OIB   LOCATION:  2899                         FACILITY:  [facility]   PHYSICIAN:  Nanetta Batty, M.D.   DATE OF BIRTH:  1950-07-05   DATE OF PROCEDURE:  03/20/2005  DATE OF DISCHARGE:  03/20/2005                              CARDIAC CATHETERIZATION   Mr. Westbrooks is a 60 year old white gentleman with a history of LAD PCI and  stenting using a Tetra 3 by 8 mm stent in the proximal LAD after the first  diagonal branch in June 2001.  Relook in May 2004 was widely patent.  He did  have cutting balloon atherectomy prior to that with IVUS and brachy therapy.  His other problems include obstructive sleep apnea, hypertension,  hyperlipidemia.  He has recently complained of some atypical chest pain and  some nervousness which were his pre-intervention symptoms.  A Cardiolite  performed March 08, 2005, showed mild anterolateral ischemia.  Because of  this, he presents for diagnostic coronary arteriography to rule out ischemic  etiology and to clear him for back surgery.   DESCRIPTION OF PROCEDURE:  The patient was brought to the second floor [facility] in the postabsorptive state.  He was  premedicated with p.o. Valium.  His right groin was prepped and shaved in  the usual sterile fashion.  1% Xylocaine was used for local anesthesia.  A 6  French sheath was inserted into the right femoral artery using Seldinger  technique.  A 6 French right and left Judkins diagnostic catheters as well  as 6 French pigtail catheter were used for selective coronary angiography  and left ventriculography, respectively.  A hand shot was obtained of the  right common femoral artery for star closure.  Visipaque dye was used for  the entirety of the case.  Retrograde aortic, left ventricular, and pull  back pressures were recorded.   HEMODYNAMICS:  1.  Aortic systolic  pressure 136, diastolic pressure 53.  2.  Left ventricular systolic pressure 139, diastolic pressure 10.   SELECTIVE CORONARY ANGIOGRAPHY:  1.  Left main normal.  2.  LAD normal.  The stent in the proximal LAD after the first diagonal      branch was widely patent.  3.  Left circumflex non-dominant and normal.  4.  Ramus intermedius branch large and normal.  5.  Right coronary artery large, dominant, and normal.   LEFT VENTRICULOGRAPHY:  RAO left ventriculogram was performed using 25 mL of  Visipaque dye at 12 mL per second.  The overall LVEF was estimated at  greater than 60% without focal wall motion abnormalities.   IMPRESSION:  Mr. Can has essentially normal coronary arteries and  normal LV function.  I believe his Cardiolite was false positive and his  symptoms are non-cardiac.  His right femoral artery puncture site was closed  using Star closure with excellent hemostasis.  The patient tolerated the  procedure well.  He will remain recumbent for two hours after which time he  will be discharged home and will see  me back in the office next week in  follow up.  He will be cleared for his upcoming back surgery.      Nanetta Batty, M.D.  Electronically Signed     JB/MEDQ  D:  03/20/2005  T:  03/20/2005  Job:  [id number]   cc:   Olene Craven, M.D.  Fax: [phone]   Donalee Citrin, M.D.  Fax: [phone]
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
## 2010-07-14 NOTE — Op Note (Signed)
Brendan Butler, Brendan Butler NO.:  [id number]   MEDICAL RECORD NO.:  [id number]          PATIENT TYPE:  OIB   LOCATION:  3028                         FACILITY:  [facility]   PHYSICIAN:  Donalee Citrin, M.D.        DATE OF BIRTH:  December 23, 1950   DATE OF PROCEDURE:  04/04/2005  DATE OF DISCHARGE:                                 OPERATIVE REPORT   PREOPERATIVE DIAGNOSIS:  Large central disk herniation with right-sided L5  radiculopathy and severe lumbar spinal stenosis.   PROCEDURE:  Decompressive lumbar laminectomy and microdiskectomy, L4-5, from  the right with microscopic dissection of right L5 nerve root and  microdiskectomy.   SURGEON:  Donalee Citrin, M.D.   ANESTHESIA:  General endotracheal.   FINDINGS:  A very large central and right-ward disk herniation at L4-5  causing severe spinal stenosis and biforaminal stenosis.   HISTORY OF PRESENT ILLNESS:  The patient is a very pleasant 60 year old  gentleman who sustained an injury back in November, experiencing immediate  back pain and lumbar radiculopathy.  MRI scan done in December showed a very  large central disk herniation at L4-5 causing severe thecal sac compression  and spinal stenosis.  The patient had predominantly right-sides symptoms and  patient failed all forms of conservative treatment and had some weakness in  his EHL, so the patient was recommended a decompressive laminectomy and  diskectomy.  Due to the size of the fragment, it was felt this would not be  able to be handled through a simple microdiskectomy and laminotomy incision  due to the predominantly central nature of it.  It did extend both left and  right, but due to the patient's predominance of right-sided symptoms, it was  felt to enter from the right with doing a sublaminar decompression to gain  medial retraction.  The risks and benefits of this operation were explained  to the patient; he understood and agreed to proceed forward.   DESCRIPTION OF  PROCEDURE:  The patient was brought to the OR where he was  induced under general anesthesia.  He was placed on the Wilson frame and the  back was prepped in the usual sterile fashion and a preoperative x-ray was  used to localize the L4-5 disk and a midline incision was made after  infiltration of 10 mL of lidocaine with epinephrine and Bovie electrocautery  was used to take down the subcutaneous tissues.  Subperiosteal dissection  was carried out on the lamina of L4 and L5 on the right side.  Intraoperative x-ray confirmed localization of the L4-5 disk space, then  using a 3-mm Kerrison punch, the inferior aspect of lamina of L4, the medial  facet complex, and the superior aspect of the lamina of L5 were removed in  piecemeal fashion as well as some of the central lamina was also under-  bitten, the ligamentum flavum was immediately visualize.  This was teased  away with a blunt nerve hook and a 4 Penfield, then removed in piecemeal  fashion with a 3- and 4-mm Kerrison punch.  At this point, the operating  microscope was  draped and brought into the field for microscopic  illumination.  The thecal sac was immediately visualized and noted to be  markedly tense, so the plane in the lateral gutter was developed with a 4  Penfield and then the lateral ligament was under-bitten, exposing the  lateral aspect of the L5 nerve root.  The L5 nerve root was noted to be  displaced laterally from a large disk partially presenting through the  axilla.  This was inspected and felt to extend laterally under the nerve  root, so attempts were taken to gain more lateral exposure to mobilize the  L5 nerve root over the top of it and enter from a lateral trajectory.  This  was achieved with the blunt nerve hook, dissecting the L5 nerve root off a  very large disk herniation, then reflected medially with a D'Errico.  Then  an annulotomy was made with an 11 blade scalpel and pituitary rongeurs were  used to  remove a large fragment of disk from the right lateral aspect of the  canal.  Then this allow more mobilization of the 5 root and again, another  very large central herniation was immediately visualized and the annulotomy  was extended with an 11 blade scalpel centrally.  Several large fragments of  disk were removed from the central compartment and using a downgoing Epstein  curette and a blunt nerve hook, several more fragments were removed from the  central compartment and disk space was cleaned out.  Then there was also the  piece that presented in the axilla, so working in the axilla as well as in  the inferior aspect of the disk space and the lateral aspect of the nerve,  this axillary and inferior piece was teased away with an Epstein curette and  pituitary rongeurs were used to remove it.  Then the undersurface of the  lamina centrally was under-bitten more to gain palpation over the left  lateral aspect of the canal.  The spinous process was retained and due to  the partial left lamina, but the central was under-bitten; this allowed a  coronary dilator to reach underneath the left-sided lamina and reach all the  way to the lateral aspect of the canal and palpate the L5 pedicle on the  left side.  Then using the coronary dilator, the thecal sac was mobilized  more medially and several more fragments were removed from the central and  slightly leftward aspect of the interspace from the right side approach and  at the end of the diskectomy, there was no further stenosis on the thecal  sac.  Both L5 neural foramina were explored and noted to be widely patent,  especially the one on the right, and this was easily freely mobilized.  Then  the wound was copiously irrigated and meticulous hemostasis was maintained.  Gelfoam was overlain on top of the dura.  The muscle and fascia were  reapproximated in layers with interrupted Vicryl and the skin was closed with a running 4-0 subcuticular.   Benzoin and Steri-Strips were applied.  The patient went to the recovery room in stable condition.  At the end of  the case, needle counts and sponge counts were correct.           ______________________________  Donalee Citrin, M.D.     GC/MEDQ  D:  04/04/2005  T:  04/05/2005  Job:  [id number]

## 2010-11-30 LAB — POCT CARDIAC MARKERS
CKMB, poc: 4.3 ng/mL (ref 1.0–8.0)
Myoglobin, poc: 89.9 ng/mL (ref 12–200)
Troponin i, poc: <0.05 ng/mL (ref 0.00–0.09)
Troponin i, poc: <0.05 ng/mL (ref 0.00–0.09)

## 2010-11-30 LAB — POCT I-STAT, CHEM 8
BUN: 7 mg/dL (ref 6–23)
Calcium, Ion: 1.14 mmol/L (ref 1.12–1.32)
Chloride: 105 mEq/L (ref 96–112)
Creatinine, Ser: 1 mg/dL (ref 0.4–1.5)
Glucose, Bld: 118 mg/dL — ABNORMAL HIGH (ref 70–99)

## 2011-06-20 ENCOUNTER — Encounter (HOSPITAL_COMMUNITY): Payer: Self-pay | Admitting: *Deleted

## 2011-06-20 ENCOUNTER — Emergency Department (HOSPITAL_COMMUNITY): Payer: Medicare Other

## 2011-06-20 ENCOUNTER — Emergency Department (HOSPITAL_COMMUNITY)
Admission: EM | Admit: 2011-06-20 | Discharge: 2011-06-20 | Disposition: A | Discharge status: Home / Self Care | Payer: Medicare Other | Attending: Emergency Medicine | Admitting: Emergency Medicine

## 2011-06-20 DIAGNOSIS — I10 Essential (primary) hypertension: Secondary | ICD-10-CM | POA: Insufficient documentation

## 2011-06-20 DIAGNOSIS — Z79899 Other long term (current) drug therapy: Secondary | ICD-10-CM | POA: Insufficient documentation

## 2011-06-20 DIAGNOSIS — I2510 Atherosclerotic heart disease of native coronary artery without angina pectoris: Secondary | ICD-10-CM | POA: Insufficient documentation

## 2011-06-20 DIAGNOSIS — R609 Edema, unspecified: Secondary | ICD-10-CM | POA: Insufficient documentation

## 2011-06-20 DIAGNOSIS — R079 Chest pain, unspecified: Secondary | ICD-10-CM | POA: Insufficient documentation

## 2011-06-20 DIAGNOSIS — R209 Unspecified disturbances of skin sensation: Secondary | ICD-10-CM | POA: Insufficient documentation

## 2011-06-20 DIAGNOSIS — R5381 Other malaise: Secondary | ICD-10-CM | POA: Insufficient documentation

## 2011-06-20 DIAGNOSIS — M79609 Pain in unspecified limb: Secondary | ICD-10-CM | POA: Insufficient documentation

## 2011-06-20 HISTORY — DX: Essential (primary) hypertension: I10

## 2011-06-20 HISTORY — DX: Atherosclerotic heart disease of native coronary artery without angina pectoris: I25.10

## 2011-06-20 LAB — PRO B NATRIURETIC PEPTIDE: Pro B Natriuretic peptide (BNP): 54.7 pg/mL (ref 0–125)

## 2011-06-20 LAB — DIFFERENTIAL
Basophils Relative: 0 % (ref 0–1)
Eosinophils Absolute: 0.3 10*3/uL (ref 0.0–0.7)
Monocytes Relative: 10 % (ref 3–12)
Neutrophils Relative %: 61 % (ref 43–77)

## 2011-06-20 LAB — POCT I-STAT, CHEM 8
Creatinine, Ser: 1.2 mg/dL (ref 0.50–1.35)
Glucose, Bld: 95 mg/dL (ref 70–99)
Hemoglobin: 14.3 g/dL (ref 13.0–17.0)
Potassium: 3.1 mEq/L — ABNORMAL LOW (ref 3.5–5.1)

## 2011-06-20 LAB — CBC
MCH: 29.7 pg (ref 26.0–34.0)
MCHC: 35.4 g/dL (ref 30.0–36.0)
Platelets: 288 10*3/uL (ref 150–400)
RDW: 12.3 % (ref 11.5–15.5)

## 2011-06-20 LAB — MAGNESIUM: Magnesium: 2.3 mg/dL (ref 1.5–2.5)

## 2011-06-20 NOTE — Discharge Instructions (Signed)
Chest Pain (Nonspecific) It is often hard to give a specific diagnosis for the cause of chest pain. There is always a chance that your pain could be related to something serious, such as a heart attack or a blood clot in the lungs. You need to follow up with your caregiver for further evaluation. CAUSES   Heartburn.   Pneumonia or bronchitis.   Anxiety or stress.   Inflammation around your heart (pericarditis) or lung (pleuritis or pleurisy).   A blood clot in the lung.   A collapsed lung (pneumothorax). It can develop suddenly on its own (spontaneous pneumothorax) or from injury (trauma) to the chest.   Shingles infection (herpes zoster virus).  The chest wall is composed of bones, muscles, and cartilage. Any of these can be the source of the pain.  The bones can be bruised by injury.   The muscles or cartilage can be strained by coughing or overwork.   The cartilage can be affected by inflammation and become sore (costochondritis).  DIAGNOSIS  Lab tests or other studies, such as X-rays, electrocardiography, stress testing, or cardiac imaging, may be needed to find the cause of your pain.  TREATMENT   Treatment depends on what may be causing your chest pain. Treatment may include:   Acid blockers for heartburn.   Anti-inflammatory medicine.   Pain medicine for inflammatory conditions.   Antibiotics if an infection is present.   You may be advised to change lifestyle habits. This includes stopping smoking and avoiding alcohol, caffeine, and chocolate.   You may be advised to keep your head raised (elevated) when sleeping. This reduces the chance of acid going backward from your stomach into your esophagus.   Most of the time, nonspecific chest pain will improve within 2 to 3 days with rest and mild pain medicine.  HOME CARE INSTRUCTIONS   If antibiotics were prescribed, take your antibiotics as directed. Finish them even if you start to feel better.   For the next few  days, avoid physical activities that bring on chest pain. Continue physical activities as directed.   Do not smoke.   Avoid drinking alcohol.   Only take over-the-counter or prescription medicine for pain, discomfort, or fever as directed by your caregiver.   Follow your caregiver's suggestions for further testing if your chest pain does not go away.   Keep any follow-up appointments you made. If you do not go to an appointment, you could develop lasting (chronic) problems with pain. If there is any problem keeping an appointment, you must call to reschedule.  SEEK MEDICAL CARE IF:   You think you are having problems from the medicine you are taking. Read your medicine instructions carefully.   Your chest pain does not go away, even after treatment.   You develop a rash with blisters on your chest.  SEEK IMMEDIATE MEDICAL CARE IF:   You have increased chest pain or pain that spreads to your arm, neck, jaw, back, or abdomen.   You develop shortness of breath, an increasing cough, or you are coughing up blood.   You have severe back or abdominal pain, feel nauseous, or vomit.   You develop severe weakness, fainting, or chills.   You have a fever.  THIS IS AN EMERGENCY. Do not wait to see if the pain will go away. Get medical help at once. Call your local emergency services (911 in [place] Do not drive yourself to the hospital. MAKE SURE YOU:   Understand these instructions.     Will watch your condition.   Will get help right away if you are not doing well or get worse.  Document Released: 11/22/2004 Document Revised: 02/01/2011 Document Reviewed: 09/18/2007 [employer] Patient Information 2012 [employer]. 

## 2011-06-20 NOTE — ED Provider Notes (Signed)
History     CSN: [id number]  Arrival date & time 06/20/11  1519   First MD Initiated Contact with Patient 06/20/11 1612      Chief Complaint  Patient presents with  . Chest Pain  . Weakness    (Consider location/radiation/quality/duration/timing/severity/associated sxs/prior treatment) HPI Comments: Patient with a history of CAD and stent placement 8 years ago presents today after having chest pain with radiation down left arm with numbness of left fingers on Sunday.  States that he took his nitroglycerin which relieved the pain - states no further return of the pain.  States that he continues with generalized weakness and fatigue but that this has been going on for several months.  He has seen his PCP for the weakness, had labs drawn but no cause specified.  Patient is a 61 y.o. male presenting with chest pain and weakness. The history is provided by the patient. No language interpreter was used.  Chest Pain The chest pain began 3 - 5 days ago. Duration of episode(s) is 15 minutes. Chest pain occurs rarely. The chest pain is resolved. The pain is associated with exertion. At its most intense, the pain is at 8/10. The pain is currently at 0/10. The severity of the pain is mild. The quality of the pain is described as heavy. The pain radiates to the left arm. Primary symptoms include fatigue. Pertinent negatives for primary symptoms include no fever, no syncope, no shortness of breath, no cough, no wheezing, no palpitations, no abdominal pain, no nausea, no vomiting, no dizziness and no altered mental status.  Associated symptoms include lower extremity edema and weakness.  Pertinent negatives for associated symptoms include no claudication, no diaphoresis, no near-syncope, no numbness, no orthopnea and no paroxysmal nocturnal dyspnea. He tried nitroglycerin for the symptoms. Risk factors include male gender.  His past medical history is significant for CAD and hypertension.  Procedure history  is positive for cardiac catheterization.    Weakness Primary symptoms do not include syncope, altered mental status, dizziness, fever, nausea or vomiting.  Additional symptoms include weakness. Medical issues also include hypertension.    Past Medical History  Diagnosis Date  . Hypertension   . Coronary artery disease     Past Surgical History  Procedure Date  . Coronary angioplasty with stent placement     History reviewed. No pertinent family history.  History  Substance Use Topics  . Smoking status: Not on file  . Smokeless tobacco: Not on file  . Alcohol Use: No      Review of Systems  Constitutional: Positive for fatigue. Negative for fever and diaphoresis.  Respiratory: Negative for cough, shortness of breath and wheezing.   Cardiovascular: Positive for chest pain. Negative for palpitations, orthopnea, claudication, syncope and near-syncope.  Gastrointestinal: Negative for nausea, vomiting and abdominal pain.  Neurological: Positive for weakness. Negative for dizziness and numbness.  Psychiatric/Behavioral: Negative for altered mental status.  All other systems reviewed and are negative.    Allergies  Aspirin  Home Medications   Current Outpatient Rx  Name Route Sig Dispense Refill  . ATENOLOL 50 MG PO TABS Oral Take 50 mg by mouth daily.    . BUPROPION HCL ER (XL) 150 MG PO TB24 Oral Take 150 mg by mouth daily.    Marland Kitchen CLOPIDOGREL BISULFATE 75 MG PO TABS Oral Take 75 mg by mouth daily.    . DULOXETINE HCL 60 MG PO CPEP Oral Take 60 mg by mouth daily.    Marland Kitchen EZETIMIBE  10 MG PO TABS Oral Take 10 mg by mouth at bedtime.    . FUROSEMIDE 20 MG PO TABS Oral Take 20 mg by mouth daily.    Marland Kitchen HYDROCODONE-ACETAMINOPHEN 10-325 MG PO TABS Oral Take 1 tablet by mouth every 6 (six) hours as needed. For pain.    Marland Kitchen LOSARTAN POTASSIUM-HCTZ 100-25 MG PO TABS Oral Take 1 tablet by mouth daily.    Marland Kitchen POTASSIUM CHLORIDE ER 10 MEQ PO TBCR Oral Take 10 mEq by mouth daily.    Marland Kitchen  RANITIDINE HCL 300 MG PO CAPS Oral Take 300 mg by mouth daily.    Marland Kitchen SIMVASTATIN 20 MG PO TABS Oral Take 20 mg by mouth at bedtime.      BP 135/67  Pulse 64  Temp(Src) 98.3 F (36.8 C) (Oral)  Resp 17  SpO2 97%  Physical Exam  Nursing note and vitals reviewed. Constitutional: He is oriented to person, place, and time. He appears well-developed and well-nourished. No distress.  HENT:  Head: Normocephalic and atraumatic.  Right Ear: External ear normal.  Left Ear: External ear normal.  Nose: Nose normal.  Mouth/Throat: Oropharynx is clear and moist. No oropharyngeal exudate.  Eyes: Conjunctivae are normal. Pupils are equal, round, and reactive to light. No scleral icterus.  Neck: Normal range of motion. Neck supple.  Cardiovascular: Normal rate, regular rhythm and normal heart sounds.  Exam reveals no gallop and no friction rub.   No murmur heard. Pulmonary/Chest: Effort normal and breath sounds normal. No respiratory distress. He has no wheezes. He has no rales. He exhibits no tenderness.  Abdominal: Soft. Bowel sounds are normal. He exhibits no distension. There is no tenderness.  Musculoskeletal: Normal range of motion. He exhibits no edema and no tenderness.  Lymphadenopathy:    He has no cervical adenopathy.  Neurological: He is alert and oriented to person, place, and time. No cranial nerve deficit.  Skin: Skin is warm and dry. No rash noted. No erythema. No pallor.  Psychiatric: He has a normal mood and affect. His behavior is normal. Judgment and thought content normal.    ED Course  Procedures (including critical care time)  Labs Reviewed  CBC - Abnormal; Notable for the following:    WBC 14.2 (*)    All other components within normal limits  DIFFERENTIAL - Abnormal; Notable for the following:    Neutro Abs 8.7 (*)    Monocytes Absolute 1.5 (*)    All other components within normal limits  PRO B NATRIURETIC PEPTIDE  MAGNESIUM   No results found.   Date:  06/20/2011  Rate: 66  Rhythm: normal sinus rhythm  QRS Axis: normal  Intervals: normal  ST/T Wave abnormalities: normal  Conduction Disutrbances:none  Narrative Interpretation: Reviewed by Dr. Judd Lien  Old EKG Reviewed: unchanged Results for orders placed during the hospital encounter of 06/20/11  CBC      Component Value Range   WBC 14.2 (*) 4.0 - 10.5 (K/uL)   RBC 4.81  4.22 - 5.81 (MIL/uL)   Hemoglobin 14.3  13.0 - 17.0 (g/dL)   HCT 16.1  09.6 - 04.5 (%)   MCV 84.0  78.0 - 100.0 (fL)   MCH 29.7  26.0 - 34.0 (pg)   MCHC 35.4  30.0 - 36.0 (g/dL)   RDW 40.9  81.1 - 91.4 (%)   Platelets 288  150 - 400 (K/uL)  DIFFERENTIAL      Component Value Range   Neutrophils Relative 61  43 - 77 (%)  Neutro Abs 8.7 (*) 1.7 - 7.7 (K/uL)   Lymphocytes Relative 27  12 - 46 (%)   Lymphs Abs 3.8  0.7 - 4.0 (K/uL)   Monocytes Relative 10  3 - 12 (%)   Monocytes Absolute 1.5 (*) 0.1 - 1.0 (K/uL)   Eosinophils Relative 2  0 - 5 (%)   Eosinophils Absolute 0.3  0.0 - 0.7 (K/uL)   Basophils Relative 0  0 - 1 (%)   Basophils Absolute 0.0  0.0 - 0.1 (K/uL)  PRO B NATRIURETIC PEPTIDE      Component Value Range   Pro B Natriuretic peptide (BNP) 54.7  0 - 125 (pg/mL)  MAGNESIUM      Component Value Range   Magnesium 2.3  1.5 - 2.5 (mg/dL)  POCT I-STAT TROPONIN I      Component Value Range   Troponin i, poc 0.00  0.00 - 0.08 (ng/mL)   Comment 3           POCT I-STAT, CHEM 8      Component Value Range   Sodium 141  135 - 145 (mEq/L)   Potassium 3.1 (*) 3.5 - 5.1 (mEq/L)   Chloride 103  96 - 112 (mEq/L)   BUN 16  6 - 23 (mg/dL)   Creatinine, Ser 1.61  0.50 - 1.35 (mg/dL)   Glucose, Bld 95  70 - 99 (mg/dL)   Calcium, Ion 0.96  0.45 - 1.32 (mmol/L)   TCO2 28  0 - 100 (mmol/L)   Hemoglobin 14.3  13.0 - 17.0 (g/dL)   HCT 40.9  81.1 - 91.4 (%)   No results found.    Atypical Chest pain Weakness    MDM  Patient here with 15 minute episode of chest pain on Sunday.  He reports no further chest  pain since then.  EKG and enzymes here are re-assuring.  I spoke with Dr. Tresa Endo who recommends that the patient call the office tomorrow and get an appointment ASAP with either Dr. Allyson Sabal or one of his colleagues.  The patient is in agreement with the plan.       Izola Price [place], [place] 06/20/11 1911

## 2011-06-20 NOTE — ED Notes (Signed)
Pt reports having one episode of chest pain on Sunday night, took a nitro at home which relieved the pain. Denies having any more pain but has been feeling fatigued and tired since Sunday. ekg done at triage, no acute distress noted.

## 2011-06-24 NOTE — ED Provider Notes (Signed)
Medical screening examination/treatment/procedure(s) were performed by non-physician practitioner and as supervising physician I was immediately available for consultation/collaboration.  Geoffery Lyons, MD 06/24/11 [phone]

## 2012-12-29 ENCOUNTER — Encounter (HOSPITAL_COMMUNITY)
Admission: EM | Disposition: A | Discharge status: Home / Self Care | Payer: Medicare Other | Source: Home / Self Care | Attending: Cardiovascular Disease

## 2012-12-29 ENCOUNTER — Ambulatory Visit (HOSPITAL_COMMUNITY): Admit: 2012-12-29 | Payer: Self-pay | Admitting: Interventional Cardiology

## 2012-12-29 ENCOUNTER — Emergency Department (HOSPITAL_COMMUNITY): Payer: Medicare Other

## 2012-12-29 ENCOUNTER — Encounter (HOSPITAL_COMMUNITY): Payer: Self-pay | Admitting: Emergency Medicine

## 2012-12-29 ENCOUNTER — Inpatient Hospital Stay (HOSPITAL_COMMUNITY)
Admission: EM | Admit: 2012-12-29 | Discharge: 2013-01-01 | DRG: 247 | Disposition: A | Discharge status: Home / Self Care | Payer: Medicare Other | Attending: Cardiovascular Disease | Admitting: Cardiovascular Disease

## 2012-12-29 DIAGNOSIS — G4730 Sleep apnea, unspecified: Secondary | ICD-10-CM | POA: Diagnosis present

## 2012-12-29 DIAGNOSIS — Z87891 Personal history of nicotine dependence: Secondary | ICD-10-CM

## 2012-12-29 DIAGNOSIS — I213 ST elevation (STEMI) myocardial infarction of unspecified site: Secondary | ICD-10-CM

## 2012-12-29 DIAGNOSIS — E785 Hyperlipidemia, unspecified: Secondary | ICD-10-CM | POA: Diagnosis present

## 2012-12-29 DIAGNOSIS — Z79899 Other long term (current) drug therapy: Secondary | ICD-10-CM

## 2012-12-29 DIAGNOSIS — I2109 ST elevation (STEMI) myocardial infarction involving other coronary artery of anterior wall: Secondary | ICD-10-CM

## 2012-12-29 DIAGNOSIS — I2510 Atherosclerotic heart disease of native coronary artery without angina pectoris: Secondary | ICD-10-CM | POA: Diagnosis present

## 2012-12-29 DIAGNOSIS — I10 Essential (primary) hypertension: Secondary | ICD-10-CM | POA: Diagnosis present

## 2012-12-29 DIAGNOSIS — Z6831 Body mass index (BMI) 31.0-31.9, adult: Secondary | ICD-10-CM

## 2012-12-29 DIAGNOSIS — Z9861 Coronary angioplasty status: Secondary | ICD-10-CM

## 2012-12-29 DIAGNOSIS — Z7902 Long term (current) use of antithrombotics/antiplatelets: Secondary | ICD-10-CM

## 2012-12-29 DIAGNOSIS — G4733 Obstructive sleep apnea (adult) (pediatric): Secondary | ICD-10-CM | POA: Diagnosis present

## 2012-12-29 DIAGNOSIS — Z886 Allergy status to analgesic agent status: Secondary | ICD-10-CM

## 2012-12-29 DIAGNOSIS — E669 Obesity, unspecified: Secondary | ICD-10-CM | POA: Diagnosis present

## 2012-12-29 DIAGNOSIS — I498 Other specified cardiac arrhythmias: Secondary | ICD-10-CM | POA: Diagnosis not present

## 2012-12-29 HISTORY — DX: ST elevation (STEMI) myocardial infarction involving other coronary artery of anterior wall: I21.09

## 2012-12-29 HISTORY — PX: LEFT HEART CATH: SHX5478

## 2012-12-29 HISTORY — PX: PERCUTANEOUS CORONARY STENT INTERVENTION (PCI-S): SHX5485

## 2012-12-29 HISTORY — PX: CORONARY ANGIOPLASTY WITH STENT PLACEMENT: SHX49

## 2012-12-29 LAB — POCT I-STAT, CHEM 8
BUN: 10 mg/dL (ref 6–23)
Calcium, Ion: 1.15 mmol/L (ref 1.12–1.23)
Chloride: 97 mEq/L (ref 96–112)
Creatinine, Ser: 1.3 mg/dL (ref 0.50–1.35)
Glucose, Bld: 137 mg/dL — ABNORMAL HIGH (ref 70–99)

## 2012-12-29 LAB — CBC WITH DIFFERENTIAL/PLATELET
Hemoglobin: 14 g/dL (ref 13.0–17.0)
MCH: 30.4 pg (ref 26.0–34.0)
MCHC: 35.6 g/dL (ref 30.0–36.0)

## 2012-12-29 LAB — PROTIME-INR: Prothrombin Time: 12.4 seconds (ref 11.6–15.2)

## 2012-12-29 SURGERY — LEFT HEART CATH
Anesthesia: LOCAL

## 2012-12-29 MED ORDER — HEPARIN (PORCINE) IN NACL 2-0.9 UNIT/ML-% IJ SOLN
INTRAMUSCULAR | Status: AC
  Filled 2012-12-29: qty 1000

## 2012-12-29 MED ORDER — HEPARIN SODIUM (PORCINE) 5000 UNIT/ML IJ SOLN
INTRAMUSCULAR | Status: AC
  Filled 2012-12-29: qty 1

## 2012-12-29 MED ORDER — CLOPIDOGREL BISULFATE 300 MG PO TABS: 600.0000 mg | ORAL_TABLET | Freq: Once | ORAL | Status: DC

## 2012-12-29 MED ORDER — TICAGRELOR 90 MG PO TABS
ORAL_TABLET | ORAL | Status: AC
  Filled 2012-12-29: qty 1

## 2012-12-29 MED ORDER — MIDAZOLAM HCL 2 MG/2ML IJ SOLN
INTRAMUSCULAR | Status: AC
  Filled 2012-12-29: qty 2

## 2012-12-29 MED ORDER — CLOPIDOGREL BISULFATE 300 MG PO TABS
ORAL_TABLET | ORAL | Status: AC
  Filled 2012-12-29: qty 1

## 2012-12-29 MED ORDER — BIVALIRUDIN 250 MG IV SOLR
INTRAVENOUS | Status: AC
  Filled 2012-12-29: qty 250

## 2012-12-29 MED ORDER — VERAPAMIL HCL 2.5 MG/ML IV SOLN
INTRAVENOUS | Status: AC
  Filled 2012-12-29: qty 4

## 2012-12-29 MED ORDER — HEPARIN SODIUM (PORCINE) 5000 UNIT/ML IJ SOLN
5000.0000 [IU] | Freq: Once | INTRAMUSCULAR | Status: AC
  Administered 2012-12-29: 5000 [IU] via INTRAVENOUS

## 2012-12-29 MED ORDER — FENTANYL CITRATE 0.05 MG/ML IJ SOLN
INTRAMUSCULAR | Status: AC
  Filled 2012-12-29: qty 2

## 2012-12-29 MED ORDER — NITROGLYCERIN IN D5W 200-5 MCG/ML-% IV SOLN
10.0000 ug/min | INTRAVENOUS | Status: DC
  Filled 2012-12-29: qty 250

## 2012-12-29 MED ORDER — HYDROMORPHONE HCL PF 2 MG/ML IJ SOLN
INTRAMUSCULAR | Status: AC
  Filled 2012-12-29: qty 1

## 2012-12-29 MED ORDER — LIDOCAINE HCL (PF) 1 % IJ SOLN
INTRAMUSCULAR | Status: AC
  Filled 2012-12-29: qty 30

## 2012-12-29 MED ORDER — NITROGLYCERIN 0.2 MG/ML ON CALL CATH LAB
INTRAVENOUS | Status: AC
  Filled 2012-12-29: qty 1

## 2012-12-29 NOTE — ED Notes (Signed)
Pt to CATH lab with RN, pt remains on zoll. Cath lab staff is ready.

## 2012-12-29 NOTE — H&P (Signed)
Cardiology History and Physical  No primary provider on file.  History of Present Illness (and review of medical records): Brendan Butler is a 62 y.o. male who presents for evaluation of chest pain.  He has hx of CAD s/p PCI to LAD in 2001.  He had repeat cath in 2007 that revealed patent coronaries and patent stent.  He developed midsternal chest pain today around 8pm at rest.  Pain was 8/10 and associated with shortness of breath.  He took 3 NTG at home with no relief.  He called EMS.  EMS noted NTG at home was expired.  He was given 6 more NTG en route with no relief.  Iniital ecg was concerning for ST elevation.  Code STEMI was activated.  In ED patient continued to have ecg changes and 8/10 pain.  He was given Heparin bolus in ED.    Previous studies: 2007 HEMODYNAMICS:  1. Aortic systolic pressure 136, diastolic pressure 53.  2. Left ventricular systolic pressure 139, diastolic pressure 10.  SELECTIVE CORONARY ANGIOGRAPHY:  1. Left main normal.  2. LAD normal. The stent in the proximal LAD after the first diagonal  branch was widely patent.  3. Left circumflex non-dominant and normal.  4. Ramus intermedius branch large and normal.  5. Right coronary artery large, dominant, and normal.  LEFT VENTRICULOGRAPHY: RAO left ventriculogram was performed using 25 mL of  Visipaque dye at 12 mL per second. The overall LVEF was estimated at  greater than 60% without focal wall motion abnormalities.   Review of Systems Further review of systems was otherwise negative other than stated in HPI.  Patient Active Problem List   Diagnosis Date Noted  . CORONARY HEART DISEASE 06/29/2007  . HYPERLIPIDEMIA 06/25/2007  . HYPERTENSION 06/25/2007  . ALLERGIC RHINITIS 06/25/2007  . SLEEP APNEA 06/25/2007   Past Medical History  Diagnosis Date  . Hypertension   . Coronary artery disease     Past Surgical History  Procedure Laterality Date  . Coronary angioplasty with stent placement        (Not in a hospital admission) Allergies  Allergen Reactions  . Aspirin Hives    History  Substance Use Topics  . Smoking status: Former Games developer  . Smokeless tobacco: Not on file  . Alcohol Use: No    No family history on file.   Objective:  HR 53 BP 148/73 General appearance: alert, cooperative, appears stated age and obese, moderate distress Head: Normocephalic, without obvious abnormality, atraumatic Eyes: conjunctivae/corneas clear. PERRL, EOM's intact. Fundi benign. Neck: no carotid bruit, no JVD and supple, symmetrical, trachea midline Lungs: clear to auscultation bilaterally Chest wall: no tenderness Heart: regular rate and rhythm, S1, S2 normal, no murmur, click, rub or gallop Abdomen: soft, non-tender; bowel sounds normal; no masses,  no organomegaly Extremities: extremities normal, atraumatic, no cyanosis or edema Pulses: 2+ and symmetric Neurologic: Grossly normal  Results for orders placed during the hospital encounter of 12/29/12 (from the past 48 hour(s))  CBC WITH DIFFERENTIAL     Status: Abnormal   Collection Time    12/29/12  9:57 PM      Result Value Range   WBC 17.4 (*) 4.0 - 10.5 K/uL   RBC 4.60  4.22 - 5.81 MIL/uL   Hemoglobin 14.0  13.0 - 17.0 g/dL   HCT 11.9  14.7 - 82.9 %   MCV 85.4  78.0 - 100.0 fL   MCH 30.4  26.0 - 34.0 pg   MCHC 35.6  30.0 -  36.0 g/dL   RDW 16.1  09.6 - 04.5 %   Platelets 293  150 - 400 K/uL  POCT I-STAT TROPONIN I     Status: None   Collection Time    12/29/12 10:01 PM      Result Value Range   Troponin i, poc 0.00  0.00 - 0.08 ng/mL   Comment 3            Comment: Due to the release kinetics of cTnI,     a negative result within the first hours     of the onset of symptoms does not rule out     myocardial infarction with certainty.     If myocardial infarction is still suspected,     repeat the test at appropriate intervals.  POCT I-STAT, CHEM 8     Status: Abnormal   Collection Time    12/29/12 10:02 PM       Result Value Range   Sodium 137  135 - 145 mEq/L   Potassium 3.4 (*) 3.5 - 5.1 mEq/L   Chloride 97  96 - 112 mEq/L   BUN 10  6 - 23 mg/dL   Creatinine, Ser 4.09  0.50 - 1.35 mg/dL   Glucose, Bld 811 (*) 70 - 99 mg/dL   Calcium, Ion 9.14  7.82 - 1.23 mmol/L   TCO2 25  0 - 100 mmol/L   Hemoglobin 13.9  13.0 - 17.0 g/dL   HCT 95.6  21.3 - 08.6 %   No results found.  ECG:  Sinus rhythm HR 54, ST elevation anterolateral, ST depression inferiorly, these are significant changes from prior.  Assessment: Anterolateral STEMI CAD hx of PCI to LAD HTN HLD Obesity OSA  Plan:  1. Cardiology Admission  2. Continuous monitoring on Telemetry. 3. Repeat ekg on admit, prn chest pain or arrythmia 4. Trend cardiac biomarkers, check lipids, hgba1c, tsh 5. Medical management to include Heparin, BB, Statin, NTG prn, aspirin allergy otherwise no other contraindication to antiplatelet agent. 6. Post Procedure care in CCU.

## 2012-12-29 NOTE — ED Provider Notes (Signed)
CSN: [id number]     Arrival date & time 12/29/12  2150 History   First MD Initiated Contact with Patient 12/29/12 2154     Chief Complaint  Patient presents with  . Code STEMI   (Consider location/radiation/quality/duration/timing/severity/associated sxs/prior Treatment) The history is provided by the patient.  Brendan Butler is a 62 y.o. male hx of HTN, CAD here with chest pain and STEMI. Chest pain started around 8 PM tonight. Substernal and feeling of pressure like an elephant on his chest. He also was diaphoretic and pale at home and took some nitros with minimal relief. EMS gave him 3 more nitros and pain is 8/10. STEMI activated by EMS. He is allergic to aspirin so it was not given. He said this is similar to his previous heart attacks.    Past Medical History  Diagnosis Date  . Hypertension   . Coronary artery disease    Past Surgical History  Procedure Laterality Date  . Coronary angioplasty with stent placement     No family history on file. History  Substance Use Topics  . Smoking status: Former Games developer  . Smokeless tobacco: Not on file  . Alcohol Use: No    Review of Systems  Cardiovascular: Positive for chest pain.  All other systems reviewed and are negative.    Allergies  Aspirin  Home Medications   Current Outpatient Rx  Name  Route  Sig  Dispense  Refill  . atenolol (TENORMIN) 50 MG tablet   Oral   Take 50 mg by mouth daily.         Marland Kitchen buPROPion (WELLBUTRIN XL) 150 MG 24 hr tablet   Oral   Take 150 mg by mouth daily.         . clopidogrel (PLAVIX) 75 MG tablet   Oral   Take 75 mg by mouth daily.         . DULoxetine (CYMBALTA) 60 MG capsule   Oral   Take 60 mg by mouth daily.         Marland Kitchen ezetimibe (ZETIA) 10 MG tablet   Oral   Take 10 mg by mouth at bedtime.         . furosemide (LASIX) 20 MG tablet   Oral   Take 20 mg by mouth daily.         Marland Kitchen HYDROcodone-acetaminophen (NORCO) 10-325 MG per tablet   Oral   Take 1 tablet by  mouth every 6 (six) hours as needed. For pain.         Marland Kitchen losartan-hydrochlorothiazide (HYZAAR) 100-25 MG per tablet   Oral   Take 1 tablet by mouth daily.         . potassium chloride (K-DUR) 10 MEQ tablet   Oral   Take 10 mEq by mouth daily.         . ranitidine (ZANTAC) 300 MG capsule   Oral   Take 300 mg by mouth daily.         . simvastatin (ZOCOR) 20 MG tablet   Oral   Take 20 mg by mouth at bedtime.          There were no vitals taken for this visit. Physical Exam  Nursing note and vitals reviewed. Constitutional: He is oriented to person, place, and time.  Uncomfortable   HENT:  Head: Normocephalic.  Mouth/Throat: Oropharynx is clear and moist.  Eyes: Pupils are equal, round, and reactive to light.  Neck: Normal range of motion. Neck supple.  Cardiovascular: Normal rate, regular rhythm and normal heart sounds.   Pulmonary/Chest: Effort normal and breath sounds normal. No respiratory distress. He has no wheezes. He has no rales.  Abdominal: Soft. Bowel sounds are normal. He exhibits no distension. There is no tenderness. There is no rebound.  Musculoskeletal: Normal range of motion. He exhibits no edema and no tenderness.  Neurological: He is alert and oriented to person, place, and time.  Skin: Skin is warm and dry.  Psychiatric: He has a normal mood and affect. His behavior is normal. Judgment and thought content normal.    ED Course  Procedures (including critical care time)  CRITICAL CARE Performed by: Silverio Lay, Evaline Waltman   Total critical care time: 30 min   Critical care time was exclusive of separately billable procedures and treating other patients.  Critical care was necessary to treat or prevent imminent or life-threatening deterioration.  Critical care was time spent personally by me on the following activities: development of treatment plan with patient and/or surrogate as well as nursing, discussions with consultants, evaluation of patient's  response to treatment, examination of patient, obtaining history from patient or surrogate, ordering and performing treatments and interventions, ordering and review of laboratory studies, ordering and review of radiographic studies, pulse oximetry and re-evaluation of patient's condition.   Labs Review Labs Reviewed  CBC WITH DIFFERENTIAL - Abnormal; Notable for the following:    WBC 17.4 (*)    All other components within normal limits  POCT I-STAT, CHEM 8 - Abnormal; Notable for the following:    Potassium 3.4 (*)    Glucose, Bld 137 (*)    All other components within normal limits  PROTIME-INR  POCT I-STAT TROPONIN I   Imaging Review No results found.  EKG Interpretation     Ventricular Rate:  54 PR Interval:  169 QRS Duration: 88 QT Interval:  428 QTC Calculation: 406 R Axis:   7 Text Interpretation:  Sinus rhythm Abnormal R-wave progression, early transition Repol abnrm suggests ischemia, inferior leads ST elevation in inferior and septal  ST elevation new since previous tracing             MDM  No diagnosis found. Brendan Butler is a 62 y.o. male here with STEMI. Loaded with heparin. I ordered nitro drip but patient's BP was slightly low so it was not given. I considered plavix but cardiology will take patient directly to cath. Cards will admit.     Richardean Canal, MD 12/29/12 2222

## 2012-12-29 NOTE — CV Procedure (Addendum)
PROCEDURE:  Left heart catheterization with selective coronary angiography, left ventriculogram.  PCI LAD, aspiration thrombectomy of the LAD  INDICATIONS:  Acute anterior ST elevation MI  The risks, benefits, and details of the procedure were explained to the patient.  The patient verbalized understanding and wanted to proceed.  Informed written consent was obtained.  PROCEDURE TECHNIQUE:  After Xylocaine anesthesia a 33F slender sheath was placed in the right radial artery with a single anterior needle wall stick.   Right coronary angiography was done using a Judkins R4 guide catheter.  Left coronary angiography was attempted, but the guide catheter would not advance due to presumed spasm.  The right radial artery angiogram was performed through the guide catheter. This revealed a loop just below the elbow and accessory artery off of the loop.  The loop was the cause of difficulty. Several doses of intra-arterial nitroglycerin were given through the radial sheath. Femoral access was obtained and a CLS 3.5 Guide was used to access the left main.  The intervention was then performed.  Left ventriculography was done using a pigtail catheter.  A TR band was used for hemostasis. Angio-Seal was used for hemostasis from the femoral sheath.    CONTRAST:  Total of 155 cc.  COMPLICATIONS:  None.    HEMODYNAMICS:  Aortic pressure was 120/61; LV pressure was 120/9; LVEDP 16.  There was no gradient between the left ventricle and aorta.    ANGIOGRAPHIC DATA:   The left main coronary artery is widely patent.  The left anterior descending artery is a large vessel. In the proximal vessel, there is mild atherosclerosis. Just after the origin of a large diagonal, the LAD is occluded. After the intervention, it was noted that there is mild, diffuse disease in the mid to distal LAD. The ostium of that large first diagonal appeared patent.  The left circumflex artery is a medium size vessel.  The vessel is  widely patent. There is a medium to large size ramus vessel which is widely patent.  The right coronary artery is a large dominant vessel. The posterolateral artery is large and widely patent. The posterior descending artery is large and widely patent. There is only minimal atherosclerosis in the right coronary artery system.  LEFT VENTRICULOGRAM:  Left ventricular angiogram was done in the 30 RAO projection and revealed a small area of anterolateral hypokinesis with overall normal systolic function with an estimated ejection fraction of 55%.  LVEDP was 16 mmHg.  PCI NARRATIVE: A CLS 3.5 guiding catheters using his left main. A BMW wire was placed into the large first diagonal. Eventually, a pro-water wire was placed across the occlusion in the LAD. An aspiration catheter was advanced but would not traverse the entire stenosis. Aspiration thrombectomy was performed. Subtotally, 2.5 x 15 balloon was used to treat the diseased area and this restored TIMI 3 flow. A 2.5 x 38 Promus drug-eluting stent was then deployed. The entire stent was post dilated with a 3.0 x 20 noncompliant balloon. The ostium of the first diagonal was jailed but TIMI-3 flow was maintained in this vessel. Several doses of nitroglycerin were administered through the guide catheter.  IMPRESSIONS:  1. Normal left main coronary artery. 2. Occluded mid left anterior descending artery at the site of the previous stent. This is the culprit for today's presentation. This was successfully stented with a 2.5 x 38 promus drug-eluting stent, post dilated to greater than 3 mm in diameter. 3. Widely patent left  circumflex artery and its branches. 4. Widely patent right coronary artery. 5. Normal left ventricular systolic function.  LVEDP 16 mmHg.  Ejection fraction 55 %.  RECOMMENDATION:  Dual antiplatelet therapy indefinitely. He needs aggressive secondary prevention including lipid-lowering therapy and weight loss. Further management per Dr.  Allyson Sabal.  Of note.  Would not use right radial artery for access in the future, due to radial loop.

## 2012-12-29 NOTE — Progress Notes (Signed)
Debby Bud   12/29/12 2200  Clinical Encounter Type  Visited With Patient  Visit Type ED  Referral From Nurse  Consult/Referral To Chaplain  Spiritual Encounters  Spiritual Needs Other (Comment)  Stress Factors  Patient Stress Factors None identified  Family Stress Factors None identified   Patient is a stemi arrived while working with another patient. No family came with stemi. While waiting on the CATH lab, I went out to lobby to check to see if any family had arrived to check on patient. No family had arrived. I asked nurse to page chaplain when family had arrived.   Debby Bud

## 2012-12-29 NOTE — ED Notes (Signed)
Attempted to call cath lab- no answer.

## 2012-12-29 NOTE — ED Notes (Signed)
Waiting on 1 CATH lab staff member at this time

## 2012-12-29 NOTE — ED Notes (Signed)
Patient presents to ED via [facility]. Patient started having chest "pressure" tonight at about 8 pm. Patient presents diaphoretic and pale, rating chest pain 8/10. Code STEMI called before patient arrival. A&Ox4.

## 2012-12-30 ENCOUNTER — Encounter (HOSPITAL_COMMUNITY): Payer: Self-pay | Admitting: Interventional Cardiology

## 2012-12-30 DIAGNOSIS — I2109 ST elevation (STEMI) myocardial infarction involving other coronary artery of anterior wall: Secondary | ICD-10-CM

## 2012-12-30 DIAGNOSIS — I10 Essential (primary) hypertension: Secondary | ICD-10-CM

## 2012-12-30 DIAGNOSIS — E785 Hyperlipidemia, unspecified: Secondary | ICD-10-CM

## 2012-12-30 DIAGNOSIS — I219 Acute myocardial infarction, unspecified: Secondary | ICD-10-CM

## 2012-12-30 DIAGNOSIS — I2510 Atherosclerotic heart disease of native coronary artery without angina pectoris: Secondary | ICD-10-CM

## 2012-12-30 LAB — PROTIME-INR
INR: 1.1 (ref 0.00–1.49)
Prothrombin Time: 14 seconds (ref 11.6–15.2)

## 2012-12-30 LAB — CBC
MCH: 29.5 pg (ref 26.0–34.0)
MCHC: 34.6 g/dL (ref 30.0–36.0)
MCV: 85.2 fL (ref 78.0–100.0)
Platelets: 264 10*3/uL (ref 150–400)
RBC: 4.54 MIL/uL (ref 4.22–5.81)
RDW: 12.3 % (ref 11.5–15.5)
WBC: 15.1 10*3/uL — ABNORMAL HIGH (ref 4.0–10.5)

## 2012-12-30 LAB — HEMOGLOBIN A1C: Hgb A1c MFr Bld: 5.9 % — ABNORMAL HIGH (ref <5.7)

## 2012-12-30 LAB — BASIC METABOLIC PANEL
CO2: 23 mEq/L (ref 19–32)
Creatinine, Ser: 1.09 mg/dL (ref 0.50–1.35)
Glucose, Bld: 123 mg/dL — ABNORMAL HIGH (ref 70–99)
Potassium: 4 mEq/L (ref 3.5–5.1)
Sodium: 133 mEq/L — ABNORMAL LOW (ref 135–145)

## 2012-12-30 LAB — LIPID PANEL
Cholesterol: 169 mg/dL (ref 0–200)
HDL: 41 mg/dL (ref >39)
Total CHOL/HDL Ratio: 4.1 RATIO
VLDL: 39 mg/dL (ref 0–40)

## 2012-12-30 LAB — MAGNESIUM: Magnesium: 2 mg/dL (ref 1.5–2.5)

## 2012-12-30 LAB — MRSA PCR SCREENING: MRSA by PCR: NEGATIVE

## 2012-12-30 MED ORDER — ZOLPIDEM TARTRATE 5 MG PO TABS
5.0000 mg | ORAL_TABLET | Freq: Once | ORAL | Status: AC
  Administered 2012-12-30: 5 mg via ORAL
  Filled 2012-12-30: qty 1

## 2012-12-30 MED ORDER — TICAGRELOR 90 MG PO TABS
90.0000 mg | ORAL_TABLET | Freq: Two times a day (BID) | ORAL | Status: DC
  Administered 2012-12-30 – 2013-01-01 (×6): 90 mg via ORAL
  Filled 2012-12-30 (×8): qty 1

## 2012-12-30 MED ORDER — ACETAMINOPHEN 325 MG PO TABS: 650.0000 mg | ORAL_TABLET | ORAL | Status: DC | PRN

## 2012-12-30 MED ORDER — EZETIMIBE 10 MG PO TABS
10.0000 mg | ORAL_TABLET | Freq: Every day | ORAL | Status: DC
  Administered 2012-12-30 – 2012-12-31 (×3): 10 mg via ORAL
  Filled 2012-12-30 (×5): qty 1

## 2012-12-30 MED ORDER — DULOXETINE HCL 60 MG PO CPEP
60.0000 mg | ORAL_CAPSULE | Freq: Every day | ORAL | Status: DC
  Administered 2012-12-30 – 2013-01-01 (×3): 60 mg via ORAL
  Filled 2012-12-30 (×3): qty 1

## 2012-12-30 MED ORDER — BUPROPION HCL ER (XL) 150 MG PO TB24
150.0000 mg | ORAL_TABLET | Freq: Every day | ORAL | Status: DC
  Administered 2012-12-30 – 2013-01-01 (×3): 150 mg via ORAL
  Filled 2012-12-30 (×3): qty 1

## 2012-12-30 MED ORDER — ATORVASTATIN CALCIUM 80 MG PO TABS
80.0000 mg | ORAL_TABLET | Freq: Every day | ORAL | Status: DC
  Administered 2012-12-30 – 2013-01-01 (×4): 80 mg via ORAL
  Filled 2012-12-30 (×5): qty 1

## 2012-12-30 MED ORDER — SODIUM CHLORIDE 0.9 % IV SOLN
INTRAVENOUS | Status: DC
  Administered 2012-12-30: 10 mL/h via INTRAVENOUS

## 2012-12-30 MED ORDER — ASPIRIN EC 81 MG PO TBEC: 81.0000 mg | DELAYED_RELEASE_TABLET | Freq: Every day | ORAL | Status: DC

## 2012-12-30 MED ORDER — NITROGLYCERIN 0.4 MG SL SUBL: 0.4000 mg | SUBLINGUAL_TABLET | SUBLINGUAL | Status: DC | PRN

## 2012-12-30 MED ORDER — ATENOLOL 50 MG PO TABS
50.0000 mg | ORAL_TABLET | Freq: Every day | ORAL | Status: DC
  Administered 2012-12-30 – 2013-01-01 (×3): 50 mg via ORAL
  Filled 2012-12-30 (×3): qty 1

## 2012-12-30 MED ORDER — ONDANSETRON HCL 4 MG/2ML IJ SOLN
4.0000 mg | Freq: Four times a day (QID) | INTRAMUSCULAR | Status: DC | PRN
  Filled 2012-12-30: qty 2

## 2012-12-30 MED ORDER — LISINOPRIL 2.5 MG PO TABS
2.5000 mg | ORAL_TABLET | Freq: Every day | ORAL | Status: DC
  Administered 2012-12-30 – 2013-01-01 (×3): 2.5 mg via ORAL
  Filled 2012-12-30 (×3): qty 1

## 2012-12-30 MED ORDER — ONDANSETRON HCL 4 MG/2ML IJ SOLN
4.0000 mg | Freq: Four times a day (QID) | INTRAMUSCULAR | Status: DC | PRN
  Administered 2012-12-30: 4 mg via INTRAVENOUS

## 2012-12-30 MED FILL — Sodium Chloride IV Soln 0.9%: INTRAVENOUS | Qty: 50 | Status: AC

## 2012-12-30 NOTE — Progress Notes (Signed)
CARDIAC REHAB PHASE I   PRE:  Rate/Rhythm: 59 SB    BP: sitting 149/60    SaO2:   MODE:  Ambulation: 350 ft   POST:  Rate/Rhythm: 79 SR    BP: sitting 167/79     SaO2:   Tolerated well, thankful to be up and feeling better. To recliner. Discussed ed. Pt thinking about CRPII. Sts he has many things on his plate with taking care of his mother. Will f/u. [phone]   Elissa Lovett [place] CES, ACSM 12/30/2012 3:08 PM

## 2012-12-30 NOTE — Care Management Note (Signed)
    Page 1 of 1   12/30/2012     9:49:54 AM   CARE MANAGEMENT NOTE 12/30/2012  Patient:  Brendan Butler, Brendan Butler   Account Number:  [id number]  Date Initiated:  12/30/2012  Documentation initiated by:  Junius Creamer  Subjective/Objective Assessment:   adm w mi     Action/Plan:   lives alone   Anticipated DC Date:     Anticipated DC Plan:        DC Planning Services  CM consult  Medication Assistance      Choice offered to / List presented to:             Status of service:   Medicare Important Message given?   (If response is "NO", the following Medicare IM given date fields will be blank) Date Medicare IM given:   Date Additional Medicare IM given:    Discharge Disposition:    Per UR Regulation:  Reviewed for med. necessity/level of care/duration of stay  If discussed at Long Length of Stay Meetings, dates discussed:    Comments:  11/4 0948 debbie Naila Elizondo rn,bsn left brilinta 30day free and copay assist card w nse. will find out if has medicare d for meds or not.

## 2012-12-30 NOTE — Progress Notes (Signed)
Subjective: No further chest pain.   Objective: Vital signs in last 24 hours: Temp:  [97 F (36.1 C)-98 F (36.7 C)] 98 F (36.7 C) (11/04 0800) Pulse Rate:  [53-71] 67 (11/04 0900) Resp:  [9-21] 16 (11/04 0900) BP: (102-156)/(50-73) 147/70 mmHg (11/04 0900) SpO2:  [94 %-99 %] 97 % (11/04 0900)    Intake/Output from previous day: 11/03 0701 - 11/04 0700 In: 280 [P.O.:250; I.V.:30] Out: 700 [Urine:700] Intake/Output this shift: Total I/O In: 120 [P.O.:120] Out: 450 [Urine:400; Emesis/NG output:50]  Medications Current Facility-Administered Medications  Medication Dose Route Frequency Provider Last Rate Last Dose  . 0.9 %  sodium chloride infusion   Intravenous Continuous Everette Rank, MD 10 mL/hr at 12/30/12 0406 10 mL/hr at 12/30/12 0406  . acetaminophen (TYLENOL) tablet 650 mg  650 mg Oral Q4H PRN Pelbreton C. Terressa Koyanagi, MD      . acetaminophen (TYLENOL) tablet 650 mg  650 mg Oral Q4H PRN Everette Rank, MD      . atenolol (TENORMIN) tablet 50 mg  50 mg Oral Daily Pelbreton C. Balfour, MD      . atorvastatin (LIPITOR) tablet 80 mg  80 mg Oral q1800 Pelbreton C. Terressa Koyanagi, MD   80 mg at 12/30/12 0049  . buPROPion (WELLBUTRIN XL) 24 hr tablet 150 mg  150 mg Oral Daily Pelbreton C. Balfour, MD      . DULoxetine (CYMBALTA) DR capsule 60 mg  60 mg Oral Daily Pelbreton C. Balfour, MD      . ezetimibe (ZETIA) tablet 10 mg  10 mg Oral QHS Everette Rank, MD   10 mg at 12/30/12 0049  . nitroGLYCERIN (NITROSTAT) SL tablet 0.4 mg  0.4 mg Sublingual Q5 Min x 3 PRN Pelbreton C. Balfour, MD      . nitroGLYCERIN 0.2 mg/mL in dextrose 5 % infusion  10-200 mcg/min Intravenous Titrated Richardean Canal, MD      . ondansetron [facility]) injection 4 mg  4 mg Intravenous Q6H PRN Pelbreton C. Terressa Koyanagi, MD      . ondansetron [facility]) injection 4 mg  4 mg Intravenous Q6H PRN Everette Rank, MD      . Ticagrelor [facility]) tablet 90 mg  90 mg Oral BID Everette Rank, MD   90 mg at 12/30/12 0049    PE: General  appearance: alert, cooperative and no distress Lungs: clear to auscultation bilaterally Heart: regular rate and rhythm Extremities: no LEE Pulses: 2+ and symmetric Skin: warm and dry Neurologic: Grossly normal  Lab Results:   Recent Labs  12/29/12 2157 12/29/12 2202 12/30/12 0430  WBC 17.4*  --  15.1*  HGB 14.0 13.9 13.4  HCT 39.3 41.0 38.7*  PLT 293  --  264   BMET  Recent Labs  12/29/12 2202 12/30/12 0430  NA 137 133*  K 3.4* 4.0  CL 97 97  CO2  --  23  GLUCOSE 137* 123*  BUN 10 10  CREATININE 1.30 1.09  CALCIUM  --  9.3   PT/INR  Recent Labs  12/29/12 2157 12/30/12 0430  LABPROT 12.4 14.0  INR 0.94 1.10   Cholesterol  Recent Labs  12/30/12 0430  CHOL 169   Cardiac Panel (last 3 results)  Recent Labs  12/30/12 0215 12/30/12 0430  TROPONINI >20.00* >20.00*    Studies/Results:  Emergent LHC IMPRESSIONS:  1. Normal left main coronary artery. 2. Occluded mid left anterior descending artery at the site of the previous stent. This is the culprit for today's presentation. This  was successfully stented with a 2.5 x 38 promus drug-eluting stent, post dilated to greater than 3 mm in diameter. 3. Widely patent left circumflex artery and its branches. 4. Widely patent right coronary artery. 5. Normal left ventricular systolic function. LVEDP 16 mmHg. Ejection fraction 55 %.   Assessment/Plan  Principal Problem:   Acute myocardial infarction of other anterior wall, initial episode of care - s/p DES to mid LAD 12/29/12 Active Problems:   HYPERLIPIDEMIA   HYPERTENSION   CORONARY HEART DISEASE   SLEEP APNEA  Plan: Day 1 s/p STEMI, requiring emergent LHC + PCI. Culprit lesion was an occluded mid LAD at the sight of the previous stent. This was treated with a DES. Normal LV function w/ an EF of 55%. Troponin still > 20. No further CP. Groin is stable. Will continue to trend to ensure that levels fall. Continue on Brilinta, BB and statin. No ASA due to  allergy.    LOS: 1 day    Brittainy M. [name] 12/30/2012 10:31 AM  Agree with note written by Boyce Medici  PAC  S/P Ant STEMI with DES to mid LAD by Dr. Eldridge Dace. Trop >20. Remote LAD intervention by me 2001. C/O mild chest soreness. Exam benign. LAbs OK. EKG evolving. Will add ACE-I. He was on Plavix prior to admission. Changed to Brilenta. ASA allergic. Nl transition. [facility]. Prob transfer to tele tomorrow and home on Thurs.   Runell Gess 12/30/2012 11:12 AM

## 2012-12-31 ENCOUNTER — Encounter (HOSPITAL_COMMUNITY): Payer: Self-pay | Admitting: *Deleted

## 2012-12-31 DIAGNOSIS — I517 Cardiomegaly: Secondary | ICD-10-CM

## 2012-12-31 MED ORDER — ZOLPIDEM TARTRATE 5 MG PO TABS
5.0000 mg | ORAL_TABLET | Freq: Once | ORAL | Status: AC
  Administered 2012-12-31: 5 mg via ORAL
  Filled 2012-12-31: qty 1

## 2012-12-31 NOTE — Progress Notes (Signed)
Echocardiogram 2D Echocardiogram has been performed.  Brendan Butler 12/31/2012, 4:42 PM

## 2012-12-31 NOTE — Progress Notes (Signed)
CARDIAC REHAB PHASE I   PRE:  Rate/Rhythm: 60SR  BP:  Supine: 136/64  Sitting:   Standing:    SaO2: 96%RA  MODE:  Ambulation: 700 ft   POST:  Rate/Rhythm: 70 SR  BP:  Supine: 153/78  Sitting:   Standing:    SaO2:  1319-1356 Pt walked 700 ft with steady gait. No CP. Tolerated well. NTG use and ex ed completed with pt. Not ready to give permission for referral to CRP 2 but did want brochure for [facility]. Will think about program. Busy with mother who has dementia. Will need NTG prescription at d/c as his bottle is old and not many left.   [name], RN BSN  12/31/2012 1:51 PM

## 2012-12-31 NOTE — Progress Notes (Signed)
Subjective: Chest soreness from yesterday has resolved.  Objective: Vital signs in last 24 hours: Temp:  [97.9 F (36.6 C)-98.2 F (36.8 C)] 98.1 F (36.7 C) (11/05 0400) Pulse Rate:  [67-71] 67 (11/04 0900) Resp:  [11-22] 12 (11/05 0700) BP: (104-161)/(35-108) 128/54 mmHg (11/05 0700) SpO2:  [95 %-99 %] 97 % (11/05 0400) Weight:  [219 lb 2.2 oz (99.4 kg)] 219 lb 2.2 oz (99.4 kg) (11/05 0600)    Intake/Output from previous day: 11/04 0701 - 11/05 0700 In: 480 [P.O.:480] Out: 1790 [Urine:1740; Emesis/NG output:50] Intake/Output this shift:    Medications Current Facility-Administered Medications  Medication Dose Route Frequency Provider Last Rate Last Dose  . 0.9 %  sodium chloride infusion   Intravenous Continuous Everette Rank, MD 10 mL/hr at 12/30/12 0406 10 mL/hr at 12/30/12 0406  . acetaminophen (TYLENOL) tablet 650 mg  650 mg Oral Q4H PRN Pelbreton C. Terressa Koyanagi, MD      . acetaminophen (TYLENOL) tablet 650 mg  650 mg Oral Q4H PRN Everette Rank, MD      . atenolol (TENORMIN) tablet 50 mg  50 mg Oral Daily Pelbreton C. Terressa Koyanagi, MD   50 mg at 12/30/12 1104  . atorvastatin (LIPITOR) tablet 80 mg  80 mg Oral q1800 Pelbreton C. Terressa Koyanagi, MD   80 mg at 12/30/12 1753  . buPROPion (WELLBUTRIN XL) 24 hr tablet 150 mg  150 mg Oral Daily Pelbreton C. Terressa Koyanagi, MD   150 mg at 12/30/12 1104  . DULoxetine (CYMBALTA) DR capsule 60 mg  60 mg Oral Daily Pelbreton C. Terressa Koyanagi, MD   60 mg at 12/30/12 1104  . ezetimibe (ZETIA) tablet 10 mg  10 mg Oral QHS Everette Rank, MD   10 mg at 12/30/12 2116  . lisinopril (PRINIVIL,ZESTRIL) tablet 2.5 mg  2.5 mg Oral Daily Brittainy Simmons, PA-C   2.5 mg at 12/30/12 1246  . nitroGLYCERIN (NITROSTAT) SL tablet 0.4 mg  0.4 mg Sublingual Q5 Min x 3 PRN Pelbreton C. Balfour, MD      . nitroGLYCERIN 0.2 mg/mL in dextrose 5 % infusion  10-200 mcg/min Intravenous Titrated Richardean Canal, MD      . ondansetron [facility]) injection 4 mg  4 mg Intravenous Q6H PRN  Pelbreton C. Terressa Koyanagi, MD      . ondansetron [facility]) injection 4 mg  4 mg Intravenous Q6H PRN Everette Rank, MD   4 mg at 12/30/12 1240  . Ticagrelor (BRILINTA) tablet 90 mg  90 mg Oral BID Everette Rank, MD   90 mg at 12/30/12 2116    PE: General appearance: alert, cooperative and no distress Lungs: clear to auscultation bilaterally Heart: regular rate and rhythm, S1, S2 normal, no murmur, click, rub or gallop Extremities: No LEE Pulses: 2+ and symmetric Skin: Right groi:  No hematoma, ecchymosis.  Mildly tender Neurologic: Grossly normal  Lab Results:   Recent Labs  12/29/12 2157 12/29/12 2202 12/30/12 0430  WBC 17.4*  --  15.1*  HGB 14.0 13.9 13.4  HCT 39.3 41.0 38.7*  PLT 293  --  264   BMET  Recent Labs  12/29/12 2202 12/30/12 0430  NA 137 133*  K 3.4* 4.0  CL 97 97  CO2  --  23  GLUCOSE 137* 123*  BUN 10 10  CREATININE 1.30 1.09  CALCIUM  --  9.3   PT/INR  Recent Labs  12/29/12 2157 12/30/12 0430  LABPROT 12.4 14.0  INR 0.94 1.10   Cholesterol  Recent Labs  12/30/12 0430  CHOL 169   Lipid Panel     Component Value Date/Time   CHOL 169 12/30/2012 0430   TRIG 193* 12/30/2012 0430   HDL 41 12/30/2012 0430   CHOLHDL 4.1 12/30/2012 0430   VLDL 39 12/30/2012 0430   LDLCALC 89 12/30/2012 0430    Cardiac Panel (last 3 results)  Recent Labs  12/30/12 0215 12/30/12 0430  TROPONINI >20.00* >20.00*      Assessment/Plan   Principal Problem:   Acute myocardial infarction of other anterior wall, initial episode of care - s/p DES to mid LAD 12/29/12 Active Problems:   HYPERLIPIDEMIA   HYPERTENSION   CORONARY HEART DISEASE   SLEEP APNEA  Plan:  Day #2:  Anterior STEMI with DES to mid LAD.   Doing very well.  No complaints.  BP and HR stable.  Meds: atenolol, lipitor, zetia, lisinopril, Brilinta.  ASA allergy.   EF 55% by LV gram.  Recommend nutritional consult as OP.  DC home tomorrow.  Transfer to tele today.   LOS: 2 days    Brendan Butler,  Brendan Butler 12/31/2012 7:40 AM

## 2012-12-31 NOTE — Progress Notes (Signed)
Pt. Seen and examined. Agree with the NP/PA-C note as written.  Chest pain has resolved, ambulating without difficulty. No arrhythmias. He is aspirin allergic, on brillinta for DES to LAD. Check 2D echo today. Ok to transfer to telemetry. Probable d/c home tomorrow.  Chrystie Nose, MD, [facility] Attending Cardiologist [facility]

## 2013-01-01 MED ORDER — NITROGLYCERIN 0.4 MG SL SUBL: 0.4000 mg | SUBLINGUAL_TABLET | SUBLINGUAL | Status: DC | PRN

## 2013-01-01 MED ORDER — LISINOPRIL 2.5 MG PO TABS: 2.5000 mg | ORAL_TABLET | Freq: Every day | ORAL | Status: DC

## 2013-01-01 MED ORDER — METOPROLOL SUCCINATE ER 25 MG PO TB24: 25.0000 mg | ORAL_TABLET | Freq: Every day | ORAL | Status: DC

## 2013-01-01 MED ORDER — FUROSEMIDE 20 MG PO TABS: 20.0000 mg | ORAL_TABLET | Freq: Every day | ORAL | Status: DC

## 2013-01-01 MED ORDER — ATORVASTATIN CALCIUM 80 MG PO TABS: 80.0000 mg | ORAL_TABLET | Freq: Every day | ORAL | Status: DC

## 2013-01-01 MED ORDER — TICAGRELOR 90 MG PO TABS: 90.0000 mg | ORAL_TABLET | Freq: Two times a day (BID) | ORAL | Status: DC

## 2013-01-01 NOTE — Progress Notes (Signed)
Pt. Seen and examined. Agree with the NP/PA-C note as written.  Echo reviewed, EF ~40%, large anteroapical WMA - remains to be seen whether this will recover. He is chest pain free and ambulating without difficulty. Would add low dose lasix 20 mg daily given his newly reduced EF and re-evaluate BMP as outpatient. Change atenolol to toprol XL 25 mg daily. Continue Brillinta monotherapy (ASA allergic).  Okay to discharge home today. Follow-up with MLP in 7-10 days.  Chrystie Nose, MD, [facility] Attending Cardiologist [facility]

## 2013-01-01 NOTE — Discharge Summary (Signed)
Physician Discharge Summary  Patient ID: Brendan Butler MRN: [id number] DOB/AGE: Sep 07, 1950 62 y.o.  Admit date: 12/29/2012 Discharge date: 01/01/2013  Admission Diagnoses: STEMI  Discharge Diagnoses:  Principal Problem:   Acute myocardial infarction of other anterior wall, initial episode of care - s/p DES to mid LAD 12/29/12 Active Problems:   HYPERLIPIDEMIA   HYPERTENSION   CORONARY HEART DISEASE   SLEEP APNEA   Discharged Condition: stable  Hospital Course: The patient is a 62 y/o male, followed by Dr. Allyson Sabal, with known CAD, s/p PCI to LAD in 2001, who was admitted to [facility] on 12/29/12 for management of STEMI. On arrival to [facility], he had severe chest pain and ST elevations in the anterolateral leads, as well as ST depressions inferiorly. He was taken urgently to the cath lab for coronary intervention. The procedure was performed by Dr. Eldridge Dace. He had an occluded mid left anterior descending artery, at the site of the previous stent. This was successfully treated with PCI, utilizing a DES. All other vessels were widely patent. At time of cath, his ejection fraction was estimated at 55%. He left the cath lab in stable condition. He was transferred to the CCU. He had no post cath complications. His pain resolved. His ST elevations normalized on his post-intervention EKG. The right femoral access site remained stable and free from complication. He was placed on mono-antiplatelet therapy with Brilinta. He was not placed on ASA due to an allergy. A 2D echo was obtained 2 days post STEMI and demonstrated an EF of 35-40% with large anteroapical WMA. Cardiac rehab was consulted to assist with ambulation. He had no difficulty. He remained stable. On hospital day 3, he was seen and examined by Dr. Rennis Golden, who determined he was stable for discharge. He was discharged on Brilinta, Toprol, Lisinopril, Lipitor, Zetia and Lasix. He will follow up with [name], PA-C, for post-hospital follow up.     Consults: None  Significant Diagnostic Studies:   Emergent LHC + PCI IMPRESSIONS:  1. Normal left main coronary artery. 2. Occluded mid left anterior descending artery at the site of the previous stent. This is the culprit for today's presentation. This was successfully stented with a 2.5 x 38 promus drug-eluting stent, post dilated to greater than 3 mm in diameter. 3. Widely patent left circumflex artery and its branches. 4. Widely patent right coronary artery. 5. Normal left ventricular systolic function. LVEDP 16 mmHg. Ejection fraction 55 %.   Treatments: See Hospital Course  Discharge Exam: Blood pressure 119/67, pulse 60, temperature 98 F (36.7 C), temperature source Oral, resp. rate 18, height 5\' 10"  (1.778 m), weight 219 lb 2.2 oz (99.4 kg), SpO2 98.00%.   Disposition: 01-Home or Self Care      Discharge Orders   Future Appointments Provider Department Dept Phone   01/14/2013 2:00 PM Sukhman Martine [name] [facility] [phone]   Future Orders Complete By Expires   Diet - low sodium heart healthy  As directed    Increase activity slowly  As directed        Medication List    STOP taking these medications       atenolol 50 MG tablet  Commonly known as:  TENORMIN     clopidogrel 75 MG tablet  Commonly known as:  PLAVIX     losartan-hydrochlorothiazide 100-25 MG per tablet  Commonly known as:  HYZAAR     simvastatin 20 MG tablet  Commonly known as:  ZOCOR  TAKE these medications       atorvastatin 80 MG tablet  Commonly known as:  LIPITOR  Take 1 tablet (80 mg total) by mouth daily at 6 PM.     buPROPion 300 MG 24 hr tablet  Commonly known as:  WELLBUTRIN XL  Take 300 mg by mouth daily.     DULoxetine 60 MG capsule  Commonly known as:  CYMBALTA  Take 60 mg by mouth daily.     ezetimibe 10 MG tablet  Commonly known as:  ZETIA  Take 10 mg by mouth at bedtime.     fluticasone 50 MCG/ACT nasal spray  Commonly known as:   FLONASE  Place 2 sprays into the nose daily as needed for allergies.     furosemide 20 MG tablet  Commonly known as:  LASIX  Take 1 tablet (20 mg total) by mouth daily.  Start taking on:  01/02/2013     lisinopril 2.5 MG tablet  Commonly known as:  PRINIVIL,ZESTRIL  Take 1 tablet (2.5 mg total) by mouth daily.     metoprolol succinate 25 MG 24 hr tablet  Commonly known as:  TOPROL-XL  Take 1 tablet (25 mg total) by mouth daily.  Start taking on:  01/02/2013     nitroGLYCERIN 0.4 MG SL tablet  Commonly known as:  NITROSTAT  Place 1 tablet (0.4 mg total) under the tongue every 5 (five) minutes x 3 doses as needed for chest pain.     nitroGLYCERIN 0.4 MG SL tablet  Commonly known as:  NITROSTAT  Place 0.4 mg under the tongue every 5 (five) minutes as needed for chest pain.     ranitidine 300 MG tablet  Commonly known as:  ZANTAC  Take 300 mg by mouth every other day.     Ticagrelor 90 MG Tabs tablet  Commonly known as:  BRILINTA  Take 1 tablet (90 mg total) by mouth 2 (two) times daily.     Ticagrelor 90 MG Tabs tablet  Commonly known as:  BRILINTA  Take 1 tablet (90 mg total) by mouth 2 (two) times daily.       Follow-up Information   Follow up with [name], PA-C On 01/14/2013. (2:00 pm )    Specialty:  Cardiology   Contact information:   [address] [place] [address] [phone]      TIME SPENT ON DISCHARGE, INCLUDING PHYSICIAN TIME: > 30 MINUTES  Signed: Allayne Butcher, PA-C 01/01/2013, 3:15 PM

## 2013-01-01 NOTE — Progress Notes (Signed)
Pt provided with dc instructions and educatino. Pt verbalized understanding. Pt aware of new medications and how to take them. Pt aware of medications they need to stop or change hwo they take.  No questions at this time. IV removed with tip intact. Heart monitor cleaned and returned to front. Levonne Spiller, RN

## 2013-01-01 NOTE — Progress Notes (Signed)
Subjective: No complaints. Denies any further CP or SOB. Ambulating w/o difficulty.   Objective: Vital signs in last 24 hours: Temp:  [97.8 F (36.6 C)-98.8 F (37.1 C)] 98 F (36.7 C) (11/06 0441) Pulse Rate:  [56-64] 60 (11/06 0441) Resp:  [13-18] 18 (11/06 0441) BP: (116-136)/(46-75) 119/67 mmHg (11/06 0441) SpO2:  [96 %-98 %] 98 % (11/06 0441) Last BM Date: 12/31/12  Intake/Output from previous day: 11/05 0701 - 11/06 0700 In: 600 [P.O.:600] Out: 500 [Urine:500] Intake/Output this shift:    Medications Current Facility-Administered Medications  Medication Dose Route Frequency Provider Last Rate Last Dose  . 0.9 %  sodium chloride infusion   Intravenous Continuous Everette Rank, MD 10 mL/hr at 12/30/12 0406 10 mL/hr at 12/30/12 0406  . acetaminophen (TYLENOL) tablet 650 mg  650 mg Oral Q4H PRN Pelbreton C. Terressa Koyanagi, MD      . acetaminophen (TYLENOL) tablet 650 mg  650 mg Oral Q4H PRN Everette Rank, MD      . atenolol (TENORMIN) tablet 50 mg  50 mg Oral Daily Pelbreton C. Terressa Koyanagi, MD   50 mg at 12/31/12 1008  . atorvastatin (LIPITOR) tablet 80 mg  80 mg Oral q1800 Pelbreton C. Terressa Koyanagi, MD   80 mg at 12/31/12 1831  . buPROPion (WELLBUTRIN XL) 24 hr tablet 150 mg  150 mg Oral Daily Pelbreton C. Terressa Koyanagi, MD   150 mg at 12/31/12 1008  . DULoxetine (CYMBALTA) DR capsule 60 mg  60 mg Oral Daily Pelbreton C. Balfour, MD   60 mg at 12/31/12 1008  . ezetimibe (ZETIA) tablet 10 mg  10 mg Oral QHS Everette Rank, MD   10 mg at 12/31/12 2127  . lisinopril (PRINIVIL,ZESTRIL) tablet 2.5 mg  2.5 mg Oral Daily Laylanie Kruczek, PA-C   2.5 mg at 12/31/12 1008  . nitroGLYCERIN (NITROSTAT) SL tablet 0.4 mg  0.4 mg Sublingual Q5 Min x 3 PRN Pelbreton C. Balfour, MD      . nitroGLYCERIN 0.2 mg/mL in dextrose 5 % infusion  10-200 mcg/min Intravenous Titrated Richardean Canal, MD      . ondansetron [facility]) injection 4 mg  4 mg Intravenous Q6H PRN Pelbreton C. Terressa Koyanagi, MD      . ondansetron [facility])  injection 4 mg  4 mg Intravenous Q6H PRN Everette Rank, MD   4 mg at 12/30/12 1240  . Ticagrelor (BRILINTA) tablet 90 mg  90 mg Oral BID Everette Rank, MD   90 mg at 12/31/12 2127    PE: General appearance: alert, cooperative and no distress Lungs: clear to auscultation bilaterally Heart: regular rate and rhythm, S1, S2 normal, no murmur, click, rub or gallop Extremities: no LEE Pulses: 2+ and symmetric Skin: warm and dry Neurologic: Grossly normal  Lab Results:   Recent Labs  12/29/12 2157 12/29/12 2202 12/30/12 0430  WBC 17.4*  --  15.1*  HGB 14.0 13.9 13.4  HCT 39.3 41.0 38.7*  PLT 293  --  264   BMET  Recent Labs  12/29/12 2202 12/30/12 0430  NA 137 133*  K 3.4* 4.0  CL 97 97  CO2  --  23  GLUCOSE 137* 123*  BUN 10 10  CREATININE 1.30 1.09  CALCIUM  --  9.3   PT/INR  Recent Labs  12/29/12 2157 12/30/12 0430  LABPROT 12.4 14.0  INR 0.94 1.10   Cholesterol  Recent Labs  12/30/12 0430  CHOL 169     Assessment/Plan  Principal Problem:   Acute myocardial infarction  of other anterior wall, initial episode of care - s/p DES to mid LAD 12/29/12 Active Problems:   HYPERLIPIDEMIA   HYPERTENSION   CORONARY HEART DISEASE   SLEEP APNEA  Plan: Day 3 s/p STEMI, treated with PCI and DES to LAD. On Brilinta. No ASA due to allergy. Continue on atenolol, Lipitor, lisinopril and Zetia. No further cp. Ambulating w/o difficulty. Exam benign. Vitals stable. Borderline bradycardic, w/ HR in the upper 50s-low 60s, but asymptomatic. Plan to discharge home today.     LOS: 3 days    Brendan Butler M. [name] 01/01/2013 8:37 AM

## 2013-01-01 NOTE — Care Management (Signed)
Case manager did call [facility] in [place] and pt will need prior authorization for Brilinta. CM did call [place] to call authorization # [id number] in order for insurance to pay for med. [facility] does have medication in stock. Pt has 30 day free card.  Gala Lewandowsky, RN, BSN [phone]

## 2013-01-05 ENCOUNTER — Encounter (HOSPITAL_COMMUNITY): Payer: Self-pay | Admitting: Cardiology

## 2013-01-05 ENCOUNTER — Inpatient Hospital Stay (HOSPITAL_COMMUNITY)
Admission: EM | Admit: 2013-01-05 | Discharge: 2013-01-08 | DRG: 250 | Disposition: A | Discharge status: Home Health | Payer: Medicare Other | Source: Ambulatory Visit | Attending: Cardiology | Admitting: Cardiology

## 2013-01-05 ENCOUNTER — Encounter (HOSPITAL_COMMUNITY)
Admission: EM | Disposition: A | Discharge status: Home Health | Payer: Self-pay | Source: Ambulatory Visit | Attending: Cardiology

## 2013-01-05 DIAGNOSIS — Z87891 Personal history of nicotine dependence: Secondary | ICD-10-CM

## 2013-01-05 DIAGNOSIS — E785 Hyperlipidemia, unspecified: Secondary | ICD-10-CM

## 2013-01-05 DIAGNOSIS — I220 Subsequent ST elevation (STEMI) myocardial infarction of anterior wall: Secondary | ICD-10-CM

## 2013-01-05 DIAGNOSIS — I2109 ST elevation (STEMI) myocardial infarction involving other coronary artery of anterior wall: Secondary | ICD-10-CM

## 2013-01-05 DIAGNOSIS — G4730 Sleep apnea, unspecified: Secondary | ICD-10-CM | POA: Diagnosis present

## 2013-01-05 DIAGNOSIS — I213 ST elevation (STEMI) myocardial infarction of unspecified site: Secondary | ICD-10-CM | POA: Diagnosis present

## 2013-01-05 DIAGNOSIS — I4901 Ventricular fibrillation: Secondary | ICD-10-CM | POA: Diagnosis not present

## 2013-01-05 DIAGNOSIS — I2510 Atherosclerotic heart disease of native coronary artery without angina pectoris: Secondary | ICD-10-CM

## 2013-01-05 DIAGNOSIS — I10 Essential (primary) hypertension: Secondary | ICD-10-CM

## 2013-01-05 DIAGNOSIS — Z888 Allergy status to other drugs, medicaments and biological substances status: Secondary | ICD-10-CM | POA: Diagnosis present

## 2013-01-05 DIAGNOSIS — Z79899 Other long term (current) drug therapy: Secondary | ICD-10-CM

## 2013-01-05 DIAGNOSIS — T82897A Other specified complication of cardiac prosthetic devices, implants and grafts, initial encounter: Principal | ICD-10-CM | POA: Diagnosis present

## 2013-01-05 DIAGNOSIS — T82867S Thrombosis of cardiac prosthetic devices, implants and grafts, sequela: Secondary | ICD-10-CM

## 2013-01-05 DIAGNOSIS — Z7902 Long term (current) use of antithrombotics/antiplatelets: Secondary | ICD-10-CM

## 2013-01-05 DIAGNOSIS — Z550 Illiteracy and low-level literacy: Secondary | ICD-10-CM

## 2013-01-05 DIAGNOSIS — Y849 Medical procedure, unspecified as the cause of abnormal reaction of the patient, or of later complication, without mention of misadventure at the time of the procedure: Secondary | ICD-10-CM | POA: Diagnosis present

## 2013-01-05 DIAGNOSIS — G4733 Obstructive sleep apnea (adult) (pediatric): Secondary | ICD-10-CM | POA: Diagnosis present

## 2013-01-05 HISTORY — DX: ST elevation (STEMI) myocardial infarction of unspecified site: I21.3

## 2013-01-05 HISTORY — PX: LEFT HEART CATHETERIZATION WITH CORONARY ANGIOGRAM: SHX5451

## 2013-01-05 HISTORY — PX: CORONARY ANGIOPLASTY WITH STENT PLACEMENT: SHX49

## 2013-01-05 LAB — PROTIME-INR
INR: 1.16 (ref 0.00–1.49)
Prothrombin Time: 14.6 seconds (ref 11.6–15.2)

## 2013-01-05 LAB — POCT ACTIVATED CLOTTING TIME
Activated Clotting Time: 211 seconds
Activated Clotting Time: 252 s

## 2013-01-05 LAB — POCT I-STAT, CHEM 8
BUN: 11 mg/dL (ref 6–23)
Calcium, Ion: 1.16 mmol/L (ref 1.12–1.23)
Chloride: 101 mEq/L (ref 96–112)
Creatinine, Ser: 1.1 mg/dL (ref 0.50–1.35)
TCO2: 20 mmol/L (ref 0–100)

## 2013-01-05 LAB — HEMOGLOBIN A1C
Hgb A1c MFr Bld: 5.7 % — ABNORMAL HIGH (ref <5.7)
Mean Plasma Glucose: 117 mg/dL — ABNORMAL HIGH (ref <117)

## 2013-01-05 LAB — CBC
HCT: 35 % — ABNORMAL LOW (ref 39.0–52.0)
MCH: 30.5 pg (ref 26.0–34.0)
MCHC: 35.4 g/dL (ref 30.0–36.0)
MCV: 86 fL (ref 78.0–100.0)
Platelets: 272 10*3/uL (ref 150–400)
RDW: 12.3 % (ref 11.5–15.5)
WBC: 18.8 10*3/uL — ABNORMAL HIGH (ref 4.0–10.5)

## 2013-01-05 LAB — CK TOTAL AND CKMB (NOT AT ARMC)
CK, MB: 3.7 ng/mL (ref 0.3–4.0)
Total CK: 141 U/L (ref 7–232)

## 2013-01-05 LAB — APTT: aPTT: 144 seconds — ABNORMAL HIGH (ref 24–37)

## 2013-01-05 LAB — TROPONIN I: Troponin I: 0.52 ng/mL (ref <0.30)

## 2013-01-05 LAB — COMPREHENSIVE METABOLIC PANEL
Albumin: 3.3 g/dL — ABNORMAL LOW (ref 3.5–5.2)
BUN: 12 mg/dL (ref 6–23)
Creatinine, Ser: 1.05 mg/dL (ref 0.50–1.35)
GFR calc Af Amer: 88 mL/min — ABNORMAL LOW (ref >90)
Potassium: 3.3 mEq/L — ABNORMAL LOW (ref 3.5–5.1)
Total Protein: 6.2 g/dL (ref 6.0–8.3)

## 2013-01-05 LAB — LIPID PANEL
HDL: 32 mg/dL — ABNORMAL LOW (ref >39)
Triglycerides: 94 mg/dL (ref <150)
VLDL: 19 mg/dL (ref 0–40)

## 2013-01-05 SURGERY — LEFT HEART CATHETERIZATION WITH CORONARY ANGIOGRAM
Anesthesia: LOCAL

## 2013-01-05 MED ORDER — LISINOPRIL 2.5 MG PO TABS
2.5000 mg | ORAL_TABLET | Freq: Every day | ORAL | Status: DC
  Filled 2013-01-05 (×2): qty 1

## 2013-01-05 MED ORDER — SODIUM CHLORIDE 0.9 % IJ SOLN: 3.0000 mL | INTRAMUSCULAR | Status: DC | PRN

## 2013-01-05 MED ORDER — DULOXETINE HCL 60 MG PO CPEP
60.0000 mg | ORAL_CAPSULE | Freq: Every day | ORAL | Status: DC
  Administered 2013-01-06 – 2013-01-08 (×3): 60 mg via ORAL
  Filled 2013-01-05 (×4): qty 1

## 2013-01-05 MED ORDER — MIDAZOLAM HCL 2 MG/2ML IJ SOLN
INTRAMUSCULAR | Status: AC
  Filled 2013-01-05: qty 2

## 2013-01-05 MED ORDER — PRASUGREL HCL 10 MG PO TABS
ORAL_TABLET | ORAL | Status: AC
  Filled 2013-01-05: qty 6

## 2013-01-05 MED ORDER — ACETAMINOPHEN 325 MG PO TABS: 650.0000 mg | ORAL_TABLET | ORAL | Status: DC | PRN

## 2013-01-05 MED ORDER — FLUTICASONE PROPIONATE 50 MCG/ACT NA SUSP: 2.0000 | Freq: Every day | NASAL | Status: DC | PRN

## 2013-01-05 MED ORDER — ONDANSETRON HCL 4 MG/2ML IJ SOLN: 4.0000 mg | Freq: Four times a day (QID) | INTRAMUSCULAR | Status: DC | PRN

## 2013-01-05 MED ORDER — NITROGLYCERIN 0.4 MG SL SUBL: 0.4000 mg | SUBLINGUAL_TABLET | SUBLINGUAL | Status: DC | PRN

## 2013-01-05 MED ORDER — MORPHINE SULFATE 2 MG/ML IJ SOLN: 2.0000 mg | INTRAMUSCULAR | Status: DC | PRN

## 2013-01-05 MED ORDER — OXYCODONE-ACETAMINOPHEN 5-325 MG PO TABS
1.0000 | ORAL_TABLET | ORAL | Status: DC | PRN
  Administered 2013-01-06: 2 via ORAL
  Filled 2013-01-05: qty 2

## 2013-01-05 MED ORDER — PRASUGREL HCL 10 MG PO TABS
ORAL_TABLET | ORAL | Status: AC
  Filled 2013-01-05: qty 1

## 2013-01-05 MED ORDER — AMIODARONE HCL 150 MG/3ML IV SOLN
INTRAVENOUS | Status: AC
  Filled 2013-01-05: qty 3

## 2013-01-05 MED ORDER — EPTIFIBATIDE 75 MG/100ML IV SOLN
INTRAVENOUS | Status: AC
  Administered 2013-01-05: 2 ug/kg/min via INTRAVENOUS
  Filled 2013-01-05: qty 100

## 2013-01-05 MED ORDER — SODIUM CHLORIDE 0.9 % IV SOLN: 1.0000 mL/kg/h | INTRAVENOUS | Status: AC

## 2013-01-05 MED ORDER — FENTANYL CITRATE 0.05 MG/ML IJ SOLN
INTRAMUSCULAR | Status: AC
  Filled 2013-01-05: qty 2

## 2013-01-05 MED ORDER — ATORVASTATIN CALCIUM 80 MG PO TABS
80.0000 mg | ORAL_TABLET | Freq: Every day | ORAL | Status: DC
  Administered 2013-01-06 – 2013-01-07 (×2): 80 mg via ORAL
  Filled 2013-01-05 (×4): qty 1

## 2013-01-05 MED ORDER — PRASUGREL HCL 10 MG PO TABS
10.0000 mg | ORAL_TABLET | Freq: Every day | ORAL | Status: DC
  Administered 2013-01-06 – 2013-01-08 (×3): 10 mg via ORAL
  Filled 2013-01-05 (×3): qty 1

## 2013-01-05 MED ORDER — FUROSEMIDE 20 MG PO TABS
20.0000 mg | ORAL_TABLET | Freq: Every day | ORAL | Status: DC
  Administered 2013-01-06 – 2013-01-08 (×3): 20 mg via ORAL
  Filled 2013-01-05 (×5): qty 1

## 2013-01-05 MED ORDER — SODIUM CHLORIDE 0.9 % IV SOLN
250.0000 mL | INTRAVENOUS | Status: DC | PRN
  Administered 2013-01-06: 250 mL via INTRAVENOUS

## 2013-01-05 MED ORDER — SODIUM CHLORIDE 0.9 % IJ SOLN
3.0000 mL | Freq: Two times a day (BID) | INTRAMUSCULAR | Status: DC
  Administered 2013-01-05 – 2013-01-07 (×5): 3 mL via INTRAVENOUS

## 2013-01-05 MED ORDER — FAMOTIDINE 20 MG PO TABS
20.0000 mg | ORAL_TABLET | Freq: Every day | ORAL | Status: DC
  Administered 2013-01-06 – 2013-01-08 (×3): 20 mg via ORAL
  Filled 2013-01-05 (×5): qty 1

## 2013-01-05 MED ORDER — METOPROLOL SUCCINATE ER 25 MG PO TB24
25.0000 mg | ORAL_TABLET | Freq: Every day | ORAL | Status: DC
  Filled 2013-01-05 (×2): qty 1

## 2013-01-05 MED ORDER — BUPROPION HCL ER (XL) 300 MG PO TB24
300.0000 mg | ORAL_TABLET | Freq: Every day | ORAL | Status: DC
Start: 2013-01-06 — End: 2013-01-08
  Administered 2013-01-06 – 2013-01-08 (×3): 300 mg via ORAL
  Filled 2013-01-05 (×4): qty 1

## 2013-01-05 MED ORDER — ALPRAZOLAM 0.5 MG PO TABS
1.0000 mg | ORAL_TABLET | Freq: Every day | ORAL | Status: DC
  Administered 2013-01-06 – 2013-01-07 (×3): 1 mg via ORAL
  Filled 2013-01-05 (×3): qty 2

## 2013-01-05 MED ORDER — AMIODARONE HCL IN DEXTROSE 360-4.14 MG/200ML-% IV SOLN
30.0000 mg/h | INTRAVENOUS | Status: DC
  Administered 2013-01-05: 60 mg/h via INTRAVENOUS
  Administered 2013-01-06 – 2013-01-07 (×3): 30 mg/h via INTRAVENOUS
  Filled 2013-01-05 (×11): qty 200

## 2013-01-05 MED ORDER — EPTIFIBATIDE 75 MG/100ML IV SOLN
2.0000 ug/kg/min | INTRAVENOUS | Status: AC
  Administered 2013-01-05 – 2013-01-06 (×2): 2 ug/kg/min via INTRAVENOUS
  Filled 2013-01-05 (×3): qty 100

## 2013-01-05 MED ORDER — PRASUGREL HCL 10 MG PO TABS: 30.0000 mg | ORAL_TABLET | Freq: Once | ORAL | Status: DC

## 2013-01-05 MED ORDER — EZETIMIBE 10 MG PO TABS
10.0000 mg | ORAL_TABLET | Freq: Every day | ORAL | Status: DC
  Administered 2013-01-05 – 2013-01-07 (×3): 10 mg via ORAL
  Filled 2013-01-05 (×4): qty 1

## 2013-01-05 NOTE — Progress Notes (Addendum)
ANTICOAGULATION CONSULT NOTE - Initial Consult  Pharmacy Consult for integrilin Indication: post-PCI  Allergies  Allergen Reactions  . Aspirin Hives  . Lipitor [Atorvastatin]     Muscle cramps    Patient Measurements: Height: 5\' 10"  (177.8 cm) Weight: 219 lb 2.2 oz (99.4 kg) IBW/kg (Calculated) : 73  Vital Signs: Temp: 98.1 F (36.7 C) (11/10 1515) Temp src: Oral (11/10 1515) Pulse Rate: 75 (11/10 1230)  Labs:  Recent Labs  01/05/13 1245  HGB 12.4*  HCT 35.0*  PLT 272  APTT 144*  LABPROT 14.6  INR 1.16  CREATININE 1.05  CKTOTAL 141  CKMB 3.7  TROPONINI 0.52*    Estimated Creatinine Clearance: 89.6 ml/min (by C-G formula based on Cr of 1.05).   Medical History: Past Medical History  Diagnosis Date  . Hypertension   . Coronary artery disease   . Acute myocardial infarction of other anterior wall, initial episode of care     Medications:  Prescriptions prior to admission  Medication Sig Dispense Refill  . buPROPion (WELLBUTRIN XL) 300 MG 24 hr tablet Take 300 mg by mouth daily.      . DULoxetine (CYMBALTA) 60 MG capsule Take 60 mg by mouth daily.      Marland Kitchen ezetimibe (ZETIA) 10 MG tablet Take 10 mg by mouth at bedtime.      . fluticasone (FLONASE) 50 MCG/ACT nasal spray Place 2 sprays into the nose daily as needed for allergies.       . furosemide (LASIX) 20 MG tablet Take 1 tablet (20 mg total) by mouth daily.  30 tablet  5  . lisinopril (PRINIVIL,ZESTRIL) 2.5 MG tablet Take 1 tablet (2.5 mg total) by mouth daily.  30 tablet  5  . metoprolol succinate (TOPROL-XL) 25 MG 24 hr tablet Take 1 tablet (25 mg total) by mouth daily.  30 tablet  5  . nitroGLYCERIN (NITROSTAT) 0.4 MG SL tablet Place 1 tablet (0.4 mg total) under the tongue every 5 (five) minutes x 3 doses as needed for chest pain.  25 tablet  2  . ranitidine (ZANTAC) 300 MG tablet Take 300 mg by mouth every other day.      . Ticagrelor (BRILINTA) 90 MG TABS tablet Take 1 tablet (90 mg total) by mouth 2  (two) times daily.  60 tablet  0    Assessment: 61 yom presented to the hospital with CP and subsequently proceeded emergently to the cath lab. Now s/p PCI to continue integrilin x 18 hours post-PCI. Baseline H/H slightly low with plts WNL.   Goal of Therapy:  Monitor platelets by anticoagulation protocol: Yes   Plan:  1. Integrilin 42mcg/kg/min x 18 hours 2. Check a 8 hour CBC (AM CBC ordered by MD)  Lanyah Spengler, Drake Leach 01/05/2013,3:54 PM

## 2013-01-05 NOTE — Progress Notes (Signed)
Patient requesting something to help with sleep; paged MD/

## 2013-01-05 NOTE — H&P (Signed)
Chief Complaint: Chest Pain  HPI: The patient is a 62 y/o male, 7 days s/p anterior STEMI, who presents back to [facility] with another STEMI. He is followed by Dr. Allyson Sabal. On 12/29/12 he underwent emergent PCI to the mid LAD, utilizing a DES. The procedure was performed by Dr. Eldridge Dace.  He was placed on mono-antiplatelet therapy with Brilinta. He was not placed on ASA due to an allergy. A 2D echo was obtained 2 days post STEMI and demonstrated an EF of 35-40% with large anteroapical WMA. He was sent home 01/01/13. He was discharged on Brilinta, Toprol, Lisinopril, Lipitor, Zetia and Lasix.   The patient was apparently in his normal state of health until developing severe substernal chest pressure roughly 1 hour prior to arrival . EMS was called. On arrival, an EKG demonstrated ST elevations. Code STEMI was activated and the patient was transported urgently to the [facility] Cath Lab.   Past Medical History  Diagnosis Date  . Hypertension   . Coronary artery disease   . Acute myocardial infarction of other anterior wall, initial episode of care     Past Surgical History  Procedure Laterality Date  . Coronary angioplasty with stent placement      Family History  Problem Relation Age of Onset  . Alzheimer's disease Mother    Social History:  reports that he has quit smoking. He does not have any smokeless tobacco history on file. He reports that he does not drink alcohol or use illicit drugs.  Allergies:  Allergies  Allergen Reactions  . Aspirin Hives    Medications Prior to Admission  Medication Sig Dispense Refill  . atorvastatin (LIPITOR) 80 MG tablet Take 1 tablet (80 mg total) by mouth daily at 6 PM.  30 tablet  5  . buPROPion (WELLBUTRIN XL) 300 MG 24 hr tablet Take 300 mg by mouth daily.      . DULoxetine (CYMBALTA) 60 MG capsule Take 60 mg by mouth daily.      Marland Kitchen ezetimibe (ZETIA) 10 MG tablet Take 10 mg by mouth at bedtime.      . fluticasone (FLONASE) 50 MCG/ACT nasal spray  Place 2 sprays into the nose daily as needed for allergies.       . furosemide (LASIX) 20 MG tablet Take 1 tablet (20 mg total) by mouth daily.  30 tablet  5  . lisinopril (PRINIVIL,ZESTRIL) 2.5 MG tablet Take 1 tablet (2.5 mg total) by mouth daily.  30 tablet  5  . metoprolol succinate (TOPROL-XL) 25 MG 24 hr tablet Take 1 tablet (25 mg total) by mouth daily.  30 tablet  5  . nitroGLYCERIN (NITROSTAT) 0.4 MG SL tablet Place 0.4 mg under the tongue every 5 (five) minutes as needed for chest pain.      . nitroGLYCERIN (NITROSTAT) 0.4 MG SL tablet Place 1 tablet (0.4 mg total) under the tongue every 5 (five) minutes x 3 doses as needed for chest pain.  25 tablet  2  . ranitidine (ZANTAC) 300 MG tablet Take 300 mg by mouth every other day.      . Ticagrelor (BRILINTA) 90 MG TABS tablet Take 1 tablet (90 mg total) by mouth 2 (two) times daily.  60 tablet  10  . Ticagrelor (BRILINTA) 90 MG TABS tablet Take 1 tablet (90 mg total) by mouth 2 (two) times daily.  60 tablet  0    No results found for this or any previous visit (from the past 48  hour(s)). No results found.  Review of Systems  Cardiovascular: Positive for chest pain.  All other systems reviewed and are negative.    Height 5\' 10"  (1.778 m), weight 219 lb 2.2 oz (99.4 kg). He is pale in moderate distress secondary to chest pain.  Physical Exam  Constitutional: He is oriented to person, place, and time.  Cardiovascular: Normal rate and regular rhythm.  Exam reveals no gallop and no friction rub.   No murmur heard. Respiratory: Effort normal and breath sounds normal. No respiratory distress. He has no wheezes.  Neurological: He is alert and oriented to person, place, and time.   there is no peripheral edema. Skin: no rash  Assessment/Plan Principal Problem:   STEMI (ST elevation myocardial infarction) Active Problems:   HYPERLIPIDEMIA   HYPERTENSION   CORONARY HEART DISEASE - S/P STEMI 12/29/12 Mid LAD occlusion treated with  DES  Plan: 62 y/o male, s/p recent anterior STEMI 7 days ago with PCI + DES to mid LAD, presents back with a second STEMI. Discharged initially on Brilinta. Concern is for in-stent thrombosis. The patient has been taken to the cath lab for urgent PCI by Dr. Excell Seltzer.   Allayne Butcher, PA-C 01/05/2013, 12:40 PM  Patient seen, examined. Available data reviewed. Agree with findings, assessment, and plan as outlined by [name], PA-C. Chart extensively reviewed. 62 year-old gentleman with known CAD s/p PCI last week after presenting with AMI. He developed severe SSCP one hour ago with marked anterolateral ST elevation. Exam as above. Plan emergency cath and PCI. Further plans pending cath results.  Tonny Bollman, M.D. 01/05/2013 1:28 PM

## 2013-01-05 NOTE — CV Procedure (Signed)
Cardiac Catheterization Procedure Note  Name: Brendan Butler MRN: [id number] DOB: Jul 06, 1950  Procedure: Selective Coronary Angiography, PTCA and IVUS of the LAD  Indication: Anterior STEMI. The patient has known CAD. He just presented with an anterior infarct about one week ago. He was treated with primary PCI of the LAD utilizing a 2.5 x 38 mm Promus drug-eluting stent. The stent was postdilated to 3 mm. The patient is aspirin allergic. He has been compliant with brilinta and took his last dose this morning. He developed severe substernal chest pain 1 hour prior to his arrival here. EKG from the field was diagnostic of anterior STEMI and a code STEMI was called. On the Cath Lab table he developed ventricular fibrillation 3 times, each time with rapid defibrillation back to sinus rhythm. He was treated with intravenous amiodarone boluses followed by a drip.  Diagnostic Procedure Details: The right groin was prepped, draped, and anesthetized with 1% lidocaine. Groin access was chosen because the patient had a radioulnar loop at previous cath. A 6 French sheath was placed using the modified Seldinger technique. A CLS 3.5 cm guide catheter was utilized for coronary angiography. We immediately moved to PCI after initial coronary imaging. See full report below.   PROCEDURAL FINDINGS Hemodynamics: AO 126/69  Coronary angiography: Coronary dominance: right  Left mainstem: The left main arises from the left cusp and is widely patent without significant stenosis.  Left anterior descending (LAD): The LAD is totally occluded in the proximal vessel at the proximal edge of the stented segment. The occlusion occurs before the first diagonal branch.  Left circumflex (LCx): The circumflex is patent. There is a normal caliber intermediate branch with no significant stenosis. The AV circumflex supplies an OM branch with no significant stenosis  Right coronary artery (RCA): Not selectively  injected  Left ventriculography: Deferred  PCI Procedure Note:  Heparin and Integrilin were utilized for anticoagulation. Once a therapeutic ACT was achieved, a 6 [place] CLS 3.5  guide catheter was inserted.  A cougar coronary guidewire was used to cross the lesion.  The lesion was predilated with a 2.5 mm balloon followed by a 3.0 mm noncompliant balloon. Intravascular ultrasound was then performed in order to define the mechanism of stent thrombosis. The previously implanted stent appears well expanded throughout. There was an area of mouth apposition in the distal stent. I elected to dilate the entire stented segment again with a 3.5 mm noncompliant balloon.  Following PCI, there was 0% residual stenosis and TIMI-3 flow. Final angiography confirmed an excellent result. Femoral hemostasis was attempted with a Perclose device. The device failed to capture the artery. The artery was re\re sheath with a 7 French sheath with a stable groin site at the completion of the procedure.  The patient tolerated the PCI procedure well. There were no immediate procedural complications.  The patient was transferred to the post catheterization recovery area for further monitoring.  PCI Data: Vessel - LAD/Segment - proximal (in-stent) Percent Stenosis (pre)  100 TIMI-flow 0 Stent none. Primary device was a 3.5 mm noncompliant balloon Percent Stenosis (post) 0 TIMI-flow (post) 3  Final Conclusions:   1. Acute anterior wall myocardial infarction secondary to LAD stent thrombosis, treated successfully with IVUS-guided PCI 2. Patency of the left circumflex  Recommendations: The patient is ASA-allergic. Will switch him from brilinta to Effient (loaded with 30 mg in the cath lab). Continue Integrilin x 18 hours. Consider P2Y12 testing 24-48 hours after integrilin discontinued.  Tonny Bollman 01/05/2013, 1:43  PM

## 2013-01-06 DIAGNOSIS — Z888 Allergy status to other drugs, medicaments and biological substances status: Secondary | ICD-10-CM | POA: Diagnosis present

## 2013-01-06 DIAGNOSIS — R931 Abnormal findings on diagnostic imaging of heart and coronary circulation: Secondary | ICD-10-CM

## 2013-01-06 DIAGNOSIS — I519 Heart disease, unspecified: Secondary | ICD-10-CM

## 2013-01-06 DIAGNOSIS — I2510 Atherosclerotic heart disease of native coronary artery without angina pectoris: Secondary | ICD-10-CM

## 2013-01-06 DIAGNOSIS — I2109 ST elevation (STEMI) myocardial infarction involving other coronary artery of anterior wall: Secondary | ICD-10-CM

## 2013-01-06 DIAGNOSIS — I219 Acute myocardial infarction, unspecified: Secondary | ICD-10-CM

## 2013-01-06 DIAGNOSIS — I10 Essential (primary) hypertension: Secondary | ICD-10-CM

## 2013-01-06 HISTORY — DX: Abnormal findings on diagnostic imaging of heart and coronary circulation: R93.1

## 2013-01-06 LAB — BASIC METABOLIC PANEL
BUN: 9 mg/dL (ref 6–23)
CO2: 25 mEq/L (ref 19–32)
GFR calc Af Amer: 82 mL/min — ABNORMAL LOW (ref >90)
GFR calc non Af Amer: 71 mL/min — ABNORMAL LOW (ref >90)
Glucose, Bld: 116 mg/dL — ABNORMAL HIGH (ref 70–99)
Potassium: 3.9 mEq/L (ref 3.5–5.1)
Sodium: 136 mEq/L (ref 135–145)

## 2013-01-06 LAB — CBC
HCT: 37 % — ABNORMAL LOW (ref 39.0–52.0)
Hemoglobin: 12.9 g/dL — ABNORMAL LOW (ref 13.0–17.0)
Hemoglobin: 12.8 g/dL — ABNORMAL LOW (ref 13.0–17.0)
MCH: 30.4 pg (ref 26.0–34.0)
MCH: 29.9 pg (ref 26.0–34.0)
MCHC: 34.6 g/dL (ref 30.0–36.0)
MCV: 86.8 fL (ref 78.0–100.0)
RBC: 4.24 MIL/uL (ref 4.22–5.81)
RBC: 4.28 MIL/uL (ref 4.22–5.81)
WBC: 15.9 10*3/uL — ABNORMAL HIGH (ref 4.0–10.5)

## 2013-01-06 LAB — TROPONIN I
Troponin I: 4.7 ng/mL (ref <0.30)
Troponin I: 3.39 ng/mL (ref <0.30)

## 2013-01-06 MED ORDER — METOPROLOL SUCCINATE ER 50 MG PO TB24
50.0000 mg | ORAL_TABLET | Freq: Every day | ORAL | Status: DC
  Administered 2013-01-06 – 2013-01-08 (×3): 50 mg via ORAL
  Filled 2013-01-06 (×4): qty 1

## 2013-01-06 MED ORDER — LISINOPRIL 5 MG PO TABS
5.0000 mg | ORAL_TABLET | Freq: Every day | ORAL | Status: DC
  Administered 2013-01-06 – 2013-01-08 (×3): 5 mg via ORAL
  Filled 2013-01-06 (×3): qty 1

## 2013-01-06 MED ORDER — ALUM & MAG HYDROXIDE-SIMETH 200-200-20 MG/5ML PO SUSP
30.0000 mL | ORAL | Status: DC | PRN
  Administered 2013-01-06: 30 mL via ORAL
  Filled 2013-01-06: qty 30

## 2013-01-06 NOTE — Progress Notes (Signed)
Subjective: Currently CP free, however he had 3/10 chest pressure yesterday after PCI, that apparently lasted most of the afternoon. Denies SOB. No groin pain.   Objective: Vital signs in last 24 hours: Temp:  [98.1 F (36.7 C)] 98.1 F (36.7 C) (11/10 1515) Pulse Rate:  [60-78] 78 (11/11 0600) Resp:  [15-21] 18 (11/11 0600) BP: (98-162)/(58-84) 98/65 mmHg (11/11 0600) SpO2:  [93 %-99 %] 96 % (11/11 0600) Arterial Line BP: (151-166)/(70-77) 155/73 mmHg (11/10 1600) Weight:  [219 lb 2.2 oz (99.4 kg)-220 lb 7.4 oz (100 kg)] 219 lb 9.3 oz (99.6 kg) (11/11 0600)    Intake/Output from previous day: 11/10 0701 - 11/11 0700 In: 1990.8 [P.O.:720; I.V.:1270.8] Out: 3175 [Urine:3175] Intake/Output this shift:    Medications Current Facility-Administered Medications  Medication Dose Route Frequency Provider Last Rate Last Dose  . 0.9 %  sodium chloride infusion  250 mL Intravenous PRN Tonny Bollman, MD 10 mL/hr at 01/06/13 0429 250 mL at 01/06/13 0429  . acetaminophen (TYLENOL) tablet 650 mg  650 mg Oral Q4H PRN Tonny Bollman, MD      . ALPRAZolam Prudy Feeler) tablet 1 mg  1 mg Oral QHS Eldridge Scot, MD   1 mg at 01/06/13 0039  . amiodarone (NEXTERONE PREMIX) 360 mg/200 mL dextrose IV infusion  30 mg/hr Intravenous Continuous Tonny Bollman, MD 16.7 mL/hr at 01/06/13 0427 30 mg/hr at 01/06/13 0427  . atorvastatin (LIPITOR) tablet 80 mg  80 mg Oral q1800 Tonny Bollman, MD      . buPROPion (WELLBUTRIN XL) 24 hr tablet 300 mg  300 mg Oral Daily Tonny Bollman, MD      . DULoxetine (CYMBALTA) DR capsule 60 mg  60 mg Oral Daily Tonny Bollman, MD      . eptifibatide (INTEGRILIN) 75 mg / 100 mL infusion  2 mcg/kg/min Intravenous Continuous Drake Leach Rumbarger, RPH 15.9 mL/hr at 01/06/13 0428 2 mcg/kg/min at 01/06/13 0428  . ezetimibe (ZETIA) tablet 10 mg  10 mg Oral QHS Tonny Bollman, MD   10 mg at 01/05/13 2153  . famotidine (PEPCID) tablet 20 mg  20 mg Oral Daily Tonny Bollman, MD        . fluticasone [facility]) 50 MCG/ACT nasal spray 2 spray  2 spray Each Nare Daily PRN Tonny Bollman, MD      . furosemide (LASIX) tablet 20 mg  20 mg Oral Daily Tonny Bollman, MD      . lisinopril (PRINIVIL,ZESTRIL) tablet 2.5 mg  2.5 mg Oral Daily Tonny Bollman, MD      . metoprolol succinate (TOPROL-XL) 24 hr tablet 25 mg  25 mg Oral Daily Tonny Bollman, MD      . morphine 2 MG/ML injection 2 mg  2 mg Intravenous Q1H PRN Tonny Bollman, MD      . nitroGLYCERIN (NITROSTAT) SL tablet 0.4 mg  0.4 mg Sublingual Q5 Min x 3 PRN Tonny Bollman, MD      . ondansetron [facility]) injection 4 mg  4 mg Intravenous Q6H PRN Tonny Bollman, MD      . oxyCODONE-acetaminophen (PERCOCET/ROXICET) 5-325 MG per tablet 1-2 tablet  1-2 tablet Oral Q4H PRN Tonny Bollman, MD      . prasugrel (EFFIENT) tablet 10 mg  10 mg Oral Daily Tonny Bollman, MD      . prasugrel (EFFIENT) tablet 30 mg  30 mg Oral Once Tonny Bollman, MD      . sodium chloride 0.9 % injection 3 mL  3 mL Intravenous Q12H Tonny Bollman,  MD   3 mL at 01/05/13 2154  . sodium chloride 0.9 % injection 3 mL  3 mL Intravenous PRN Tonny Bollman, MD        PE: General appearance: alert, cooperative and no distress Lungs: clear to auscultation bilaterally Heart: regular rate and rhythm, S1, S2 normal, no murmur, click, rub or gallop Extremities: no LEE Pulses: 2+ and symmetric Skin: warm and dry Neurologic: Grossly normal  Lab Results:   Recent Labs  01/05/13 1245 01/05/13 2230 01/06/13 0425  WBC 18.8* 15.9* 14.8*  HGB 12.4*  12.2* 12.9* 12.8*  HCT 35.0*  36.0* 36.8* 37.0*  PLT 272 275 269   BMET  Recent Labs  01/05/13 1245 01/06/13 0425  NA 136  136 136  K 3.3*  3.4* 3.9  CL 102  101 101  CO2 21 25  GLUCOSE 148*  149* 116*  BUN 12  11 9   CREATININE 1.05  1.10 1.11  CALCIUM 8.4 8.9   PT/INR  Recent Labs  01/05/13 1245  LABPROT 14.6  INR 1.16   Cholesterol  Recent Labs  01/05/13 1245  CHOL 103   Cardiac  Panel (last 3 results)  Recent Labs  01/05/13 1245 01/05/13 2140 01/06/13 0425  CKTOTAL 141  --   --   CKMB 3.7  --   --   TROPONINI 0.52* 3.78* 4.70*  RELINDX 2.6*  --   --     Studies/Results:  Emergent PCI 01/05/13 PCI Data:  Vessel - LAD/Segment - proximal (in-stent)  Percent Stenosis (pre) 100  TIMI-flow 0  Stent none. 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 Now on Effient. Mild CP discomfort last PM, but currently CP free. EKG demonstrates resolution of ST elevations, but shows T-wave abnormalities. Troponins trending upward from 0.52-->3.78-->4.70 (today). Will recycle troponins x 3 to ensure levels are trending downward. Will order a 2D echo to assess systolic function. HR and BP stable. NSR on telemetry. Right groin stable. Keep in ICU today for continued monitoring.    LOS: 1 day    Brittainy M. [name] 01/06/2013 7:53 AM  Agree with note written by Boyce Medici  PAC  S/P ant STEMI one week after Ant STEMI Rx with PCI/Stent secondary to AST. IVUS showed mal apposition at distal edge of previously placed stent, post dil with 3.5 mm [place] balloon. Good final result. VF during procedure requiring DCCV. Clinically stable on integrelin for 18 hrs and IV amio. No further arrhythmias. Will D/C amio, let integrelin run for 18 hrs, adjust meds. CRH. Tx TCU. 2D echo for LV fxn. If EF < 35% may need a lifeVest. Now on Effient.   Runell Gess 01/06/2013 8:29 AM

## 2013-01-06 NOTE — Care Management Note (Addendum)
    Page 1 of 2   01/08/2013     11:24:40 AM   CARE MANAGEMENT NOTE 01/08/2013  Patient:  GIANNI, MIHALIK   Account Number:  [id number]  Date Initiated:  01/05/2013  Documentation initiated by:  Junius Creamer  Subjective/Objective Assessment:   adm w mi     Action/Plan:   lives alone   Anticipated DC Date:     Anticipated DC Plan:  HOME/SELF CARE      DC Planning Services  CM consult  Medication Assistance      [facility] Choice  HOME HEALTH   Choice offered to / List presented to:  C-1 Patient        HH arranged  HH-1 RN  HH-10 DISEASE MANAGEMENT  HH-2 PT      HH agency  [facility]   Status of service:  Completed, signed off Medicare Important Message given?   (If response is "NO", the following Medicare IM given date fields will be blank) Date Medicare IM given:   Date Additional Medicare IM given:    Discharge Disposition:  HOME W HOME HEALTH SERVICES  Per UR Regulation:  Reviewed for med. necessity/level of care/duration of stay  If discussed at Long Length of Stay Meetings, dates discussed:    Comments:  01-08-13 [address] Tomi Bamberger, RN,BSN [phone] CM did  speak to pt in reference to [facility] services. Pt is agreeable to [facility] providing services. CM did make referral and SOC to begin within 24-48 hours post d/c. No further needs from CM at this time.   11/11 1048a debbie dowell rn,bsn spoke w pt and gave him 30day free effient card. he has ins thru [place] for meds.

## 2013-01-06 NOTE — Progress Notes (Signed)
Echocardiogram 2D Echocardiogram limited has been performed.  Brendan Butler 01/06/2013, 11:38 AM

## 2013-01-06 NOTE — Progress Notes (Signed)
CARDIAC REHAB PHASE I   PRE:  Rate/Rhythm: 67SR  BP:  Supine: 135/74  Sitting:   Standing:    SaO2: 97%RA  MODE:  Ambulation: 350 ft   POST:  Rate/Rhythm: 71SR  BP:  Supine:   Sitting: 160/73  Standing:    SaO2: 99%RA 1135-1210 Pt walked 350 ft on RA with steady gait. Tolerated well. Denied CP . To recliner after walk. No ectopy. Call bell in reach.   [name], RN BSN  01/06/2013 12:07 PM

## 2013-01-07 ENCOUNTER — Encounter (HOSPITAL_COMMUNITY): Payer: Self-pay | Admitting: Emergency Medicine

## 2013-01-07 NOTE — Progress Notes (Signed)
Transfer report given to Winifred [facility] RN on [facility], to transfer to [employer] -12 via wheelchair, Berle Mull RN

## 2013-01-07 NOTE — Progress Notes (Signed)
Report received and care assumed.  Patient sitting in recliner.  Telemetry box placed.  Denies chest pain or SOB.  VSS. Amiodarone drip continues at prescribed rate.

## 2013-01-07 NOTE — Progress Notes (Signed)
CARDIAC REHAB PHASE I   PRE:  Rate/Rhythm: 66SR  BP:  Supine: 111/64  Sitting:   Standing:    SaO2:   MODE:  Ambulation: 700 ft   POST:  Rate/Rhythm: 76SR  BP:  Supine:   Sitting: 145/68  Standing:    SaO2:  1028-1059 Pt walked 700 ft with steady gait. Did c/o hips a little sore but denied CP. Tolerated well. Brief overview of ed as pt was just discharged 11/6 and we had seen and educated then. Pt could recite correct use of NTG, has diet info and has written ex ed. Explained to pt how to restart ex. Pt gave permission to refer to [place] Phase 2. Last admission he was considering but did not want referral. Has effient packet.   [name], RN BSN  01/07/2013 10:54 AM

## 2013-01-07 NOTE — Progress Notes (Signed)
Subjective: No complaints.   Objective: Vital signs in last 24 hours: Temp:  [97.3 F (36.3 C)-98.6 F (37 C)] 98.1 F (36.7 C) (11/12 0400) Pulse Rate:  [60-68] 68 (11/12 0300) Resp:  [15-20] 18 (11/12 0420) BP: (112-151)/(48-68) 112/49 mmHg (11/12 0400) SpO2:  [93 %-98 %] 96 % (11/12 0420) Weight:  [216 lb 14.9 oz (98.4 kg)] 216 lb 14.9 oz (98.4 kg) (11/12 0500)    Intake/Output from previous day: 11/11 0701 - 11/12 0700 In: 1629.9 [P.O.:1080; I.V.:549.9] Out: 1600 [Urine:1600] Intake/Output this shift:    Medications Current Facility-Administered Medications  Medication Dose Route Frequency Provider Last Rate Last Dose  . 0.9 %  sodium chloride infusion  250 mL Intravenous PRN Tonny Bollman, MD 10 mL/hr at 01/06/13 0429 250 mL at 01/06/13 0429  . acetaminophen (TYLENOL) tablet 650 mg  650 mg Oral Q4H PRN Tonny Bollman, MD      . ALPRAZolam Prudy Feeler) tablet 1 mg  1 mg Oral QHS Eldridge Scot, MD   1 mg at 01/06/13 2342  . alum & mag hydroxide-simeth (MAALOX/MYLANTA) 200-200-20 MG/5ML suspension 30 mL  30 mL Oral Q4H PRN Runell Gess, MD   30 mL at 01/06/13 1242  . amiodarone (NEXTERONE PREMIX) 360 mg/200 mL dextrose IV infusion  30 mg/hr Intravenous Continuous Tonny Bollman, MD 16.7 mL/hr at 01/07/13 0423 30 mg/hr at 01/07/13 0423  . atorvastatin (LIPITOR) tablet 80 mg  80 mg Oral q1800 Tonny Bollman, MD   80 mg at 01/06/13 1823  . buPROPion (WELLBUTRIN XL) 24 hr tablet 300 mg  300 mg Oral Daily Tonny Bollman, MD   300 mg at 01/06/13 0918  . DULoxetine (CYMBALTA) DR capsule 60 mg  60 mg Oral Daily Tonny Bollman, MD   60 mg at 01/06/13 0918  . ezetimibe (ZETIA) tablet 10 mg  10 mg Oral QHS Tonny Bollman, MD   10 mg at 01/06/13 2202  . famotidine (PEPCID) tablet 20 mg  20 mg Oral Daily Tonny Bollman, MD   20 mg at 01/06/13 0918  . fluticasone (FLONASE) 50 MCG/ACT nasal spray 2 spray  2 spray Each Nare Daily PRN Tonny Bollman, MD      . furosemide (LASIX) tablet  20 mg  20 mg Oral Daily Tonny Bollman, MD   20 mg at 01/06/13 1004  . lisinopril (PRINIVIL,ZESTRIL) tablet 5 mg  5 mg Oral Daily Brittainy Simmons, PA-C   5 mg at 01/06/13 1005  . metoprolol succinate (TOPROL-XL) 24 hr tablet 50 mg  50 mg Oral Daily Brittainy Simmons, PA-C   50 mg at 01/06/13 1005  . morphine 2 MG/ML injection 2 mg  2 mg Intravenous Q1H PRN Tonny Bollman, MD      . nitroGLYCERIN (NITROSTAT) SL tablet 0.4 mg  0.4 mg Sublingual Q5 Min x 3 PRN Tonny Bollman, MD      . ondansetron [facility]) injection 4 mg  4 mg Intravenous Q6H PRN Tonny Bollman, MD      . oxyCODONE-acetaminophen (PERCOCET/ROXICET) 5-325 MG per tablet 1-2 tablet  1-2 tablet Oral Q4H PRN Tonny Bollman, MD   2 tablet at 01/06/13 1242  . prasugrel (EFFIENT) tablet 10 mg  10 mg Oral Daily Tonny Bollman, MD   10 mg at 01/06/13 0920  . prasugrel (EFFIENT) tablet 30 mg  30 mg Oral Once Tonny Bollman, MD      . sodium chloride 0.9 % injection 3 mL  3 mL Intravenous Q12H Tonny Bollman, MD   3 mL at  01/06/13 2159  . sodium chloride 0.9 % injection 3 mL  3 mL Intravenous PRN Tonny Bollman, MD        PE: General appearance: alert, cooperative and no distress Lungs: clear to auscultation bilaterally Heart: regular rate and rhythm, S1, S2 normal, no murmur, click, rub or gallop Extremities: no LEE Pulses: 2+ and symmetric Skin: warm and dry Neurologic: Grossly normal  Lab Results:   Recent Labs  01/05/13 1245 01/05/13 2230 01/06/13 0425  WBC 18.8* 15.9* 14.8*  HGB 12.4*  12.2* 12.9* 12.8*  HCT 35.0*  36.0* 36.8* 37.0*  PLT 272 275 269   BMET  Recent Labs  01/05/13 1245 01/06/13 0425  NA 136  136 136  K 3.3*  3.4* 3.9  CL 102  101 101  CO2 21 25  GLUCOSE 148*  149* 116*  BUN 12  11 9   CREATININE 1.05  1.10 1.11  CALCIUM 8.4 8.9   PT/INR  Recent Labs  01/05/13 1245  LABPROT 14.6  INR 1.16   Cholesterol  Recent Labs  01/05/13 1245  CHOL 103   Cardiac Panel (last 3  results)  Recent Labs  01/05/13 1245  01/06/13 0425 01/06/13 0900 01/06/13 1516  CKTOTAL 141  --   --   --   --   CKMB 3.7  --   --   --   --   TROPONINI 0.52*  < > 4.70* 3.39* 2.17*  RELINDX 2.6*  --   --   --   --   < > = values in this interval not displayed.  Studies/Results:  2D echo 01/06/13 Study Conclusions  - Left ventricle: The cavity size was normal. Wall thickness was normal. Systolic function was mildly to moderately reduced. The estimated ejection fraction was in the range of 40% to 45%. There is anterior, anteroseptal and apical hypokinesis suggestive of LAD territory ischemia/infarct. Doppler parameters are consistent with abnormal left ventricular relaxation (grade 1 diastolic dysfunction). The E/e' ratio is >10, suggesting elevated LV filling pressure. - Left atrium: The atrium was normal in size. - Inferior vena cava: The vessel was normal in size; the respirophasic diameter changes were in the normal range (= 50%); findings are consistent with normal central venous pressure.   Assessment/Plan  Principal Problem:   STEMI 01/05/13- acute ISR of LAD stent just placed 12/29/12 Active Problems:   HYPERLIPIDEMIA   HYPERTENSION   CAD S/P STEMI 12/29/12 Mid LAD occlusion treated with DES   SLEEP APNEA   Aspirin allergy- hives  Plan: Day 2 s/p STEMI due to in-stent thrombosis of LAD stent. No further CP. HR and BP stable. Troponins trending downward. On Effient. No ASA due to allergy. 2D echo yesterday revealed systolic function was mildly to moderately reduced. The estimated ejection fraction was in the range of 40% to 45%. There is anterior, anteroseptal and apical hypokinesis suggestive of LAD territory ischemia/infarct. Continue cardiac rehab. ? Possible transfer to telemetry later today.    LOS: 2 days    Brittainy M. [name] 01/07/2013 8:14 AM  Agree with note written by Boyce Medici  [facility]  Looks better this AM. No CP/SOB. Exam benign.  Labs OK. 2D echo shows better EF then I expected. OK to Tx to Tele. [facility], prob home tomorrow.  Runell Gess 01/07/2013 9:51 AM

## 2013-01-08 DIAGNOSIS — I220 Subsequent ST elevation (STEMI) myocardial infarction of anterior wall: Secondary | ICD-10-CM | POA: Diagnosis present

## 2013-01-08 DIAGNOSIS — Z550 Illiteracy and low-level literacy: Secondary | ICD-10-CM

## 2013-01-08 LAB — BASIC METABOLIC PANEL
Calcium: 9.3 mg/dL (ref 8.4–10.5)
Chloride: 99 mEq/L (ref 96–112)
GFR calc Af Amer: 74 mL/min — ABNORMAL LOW (ref >90)
GFR calc non Af Amer: 64 mL/min — ABNORMAL LOW (ref >90)
Glucose, Bld: 116 mg/dL — ABNORMAL HIGH (ref 70–99)
Potassium: 3.6 mEq/L (ref 3.5–5.1)
Sodium: 137 mEq/L (ref 135–145)

## 2013-01-08 MED ORDER — ATORVASTATIN CALCIUM 80 MG PO TABS: 80.0000 mg | ORAL_TABLET | Freq: Every day | ORAL | Status: DC

## 2013-01-08 MED ORDER — PRASUGREL HCL 10 MG PO TABS: 10.0000 mg | ORAL_TABLET | Freq: Every day | ORAL | Status: DC

## 2013-01-08 MED ORDER — METOPROLOL SUCCINATE ER 25 MG PO TB24: 50.0000 mg | ORAL_TABLET | Freq: Every day | ORAL | Status: DC

## 2013-01-08 MED ORDER — LISINOPRIL 2.5 MG PO TABS: 5.0000 mg | ORAL_TABLET | Freq: Every day | ORAL | Status: DC

## 2013-01-08 MED ORDER — ALPRAZOLAM 1 MG PO TABS: 1.0000 mg | ORAL_TABLET | Freq: Every day | ORAL | Status: DC

## 2013-01-08 NOTE — Care Management (Signed)
1020 01-08-13  Case Manager did provide pt information for PCP and he will call [facility]. CM did call [facility] in [place] and Effient is available with a  Co pay of 6.35. Pt is aware. No further needs from CM at this time. Gala Lewandowsky, RN,BSN [phone]

## 2013-01-08 NOTE — Progress Notes (Signed)
.   Subjective: Doing well. Denies further CP/SOB. He has been working with cardiac rehab w/o difficulty. Plans on completing phase 2 CR at [place].   Objective: Vital signs in last 24 hours: Temp:  [98 F (36.7 C)-98.6 F (37 C)] 98 F (36.7 C) (11/13 0523) Pulse Rate:  [61-76] 61 (11/13 0523) Resp:  [18] 18 (11/13 0523) BP: (122-145)/(54-68) 126/54 mmHg (11/13 0523) SpO2:  [96 %-98 %] 98 % (11/13 0523) Weight:  [217 lb (98.431 kg)] 217 lb (98.431 kg) (11/13 0523) Last BM Date: 01/06/13  Intake/Output from previous day: 11/12 0701 - 11/13 0700 In: 1284.3 [P.O.:1200; I.V.:84.3] Out: -  Intake/Output this shift:    Medications Current Facility-Administered Medications  Medication Dose Route Frequency Provider Last Rate Last Dose  . 0.9 %  sodium chloride infusion  250 mL Intravenous PRN Tonny Bollman, MD 10 mL/hr at 01/06/13 0429 250 mL at 01/06/13 0429  . acetaminophen (TYLENOL) tablet 650 mg  650 mg Oral Q4H PRN Tonny Bollman, MD      . ALPRAZolam Prudy Feeler) tablet 1 mg  1 mg Oral QHS Eldridge Scot, MD   1 mg at 01/07/13 2157  . alum & mag hydroxide-simeth (MAALOX/MYLANTA) 200-200-20 MG/5ML suspension 30 mL  30 mL Oral Q4H PRN Runell Gess, MD   30 mL at 01/06/13 1242  . amiodarone (NEXTERONE PREMIX) 360 mg/200 mL dextrose IV infusion  30 mg/hr Intravenous Continuous Tonny Bollman, MD   30 mg/hr at 01/07/13 0423  . atorvastatin (LIPITOR) tablet 80 mg  80 mg Oral q1800 Tonny Bollman, MD   80 mg at 01/07/13 1800  . buPROPion (WELLBUTRIN XL) 24 hr tablet 300 mg  300 mg Oral Daily Tonny Bollman, MD   300 mg at 01/07/13 1050  . DULoxetine (CYMBALTA) DR capsule 60 mg  60 mg Oral Daily Tonny Bollman, MD   60 mg at 01/07/13 1050  . ezetimibe (ZETIA) tablet 10 mg  10 mg Oral QHS Tonny Bollman, MD   10 mg at 01/07/13 2157  . famotidine (PEPCID) tablet 20 mg  20 mg Oral Daily Tonny Bollman, MD   20 mg at 01/07/13 1050  . fluticasone (FLONASE) 50 MCG/ACT nasal spray 2 spray  2  spray Each Nare Daily PRN Tonny Bollman, MD      . furosemide (LASIX) tablet 20 mg  20 mg Oral Daily Tonny Bollman, MD   20 mg at 01/07/13 1049  . lisinopril (PRINIVIL,ZESTRIL) tablet 5 mg  5 mg Oral Daily Brittainy Simmons, PA-C   5 mg at 01/07/13 1050  . metoprolol succinate (TOPROL-XL) 24 hr tablet 50 mg  50 mg Oral Daily Brittainy Simmons, PA-C   50 mg at 01/07/13 1050  . morphine 2 MG/ML injection 2 mg  2 mg Intravenous Q1H PRN Tonny Bollman, MD      . nitroGLYCERIN (NITROSTAT) SL tablet 0.4 mg  0.4 mg Sublingual Q5 Min x 3 PRN Tonny Bollman, MD      . ondansetron [facility]) injection 4 mg  4 mg Intravenous Q6H PRN Tonny Bollman, MD      . oxyCODONE-acetaminophen (PERCOCET/ROXICET) 5-325 MG per tablet 1-2 tablet  1-2 tablet Oral Q4H PRN Tonny Bollman, MD   2 tablet at 01/06/13 1242  . prasugrel (EFFIENT) tablet 10 mg  10 mg Oral Daily Tonny Bollman, MD   10 mg at 01/07/13 1050  . sodium chloride 0.9 % injection 3 mL  3 mL Intravenous Q12H Tonny Bollman, MD   3 mL at 01/07/13 2157  .  sodium chloride 0.9 % injection 3 mL  3 mL Intravenous PRN Tonny Bollman, MD        PE: General appearance: alert, cooperative and no distress Neck: no carotid bruit and no JVD Heart: regular rate and rhythm Extremities: no LEE Pulses: 2+ and symmetric Skin: warm and dry Neurologic: Grossly normal  Lab Results:   Recent Labs  01/05/13 1245 01/05/13 2230 01/06/13 0425  WBC 18.8* 15.9* 14.8*  HGB 12.4*  12.2* 12.9* 12.8*  HCT 35.0*  36.0* 36.8* 37.0*  PLT 272 275 269   BMET  Recent Labs  01/05/13 1245 01/06/13 0425  NA 136  136 136  K 3.3*  3.4* 3.9  CL 102  101 101  CO2 21 25  GLUCOSE 148*  149* 116*  BUN 12  11 9   CREATININE 1.05  1.10 1.11  CALCIUM 8.4 8.9   PT/INR  Recent Labs  01/05/13 1245  LABPROT 14.6  INR 1.16   Cholesterol  Recent Labs  01/05/13 1245  CHOL 103    Assessment/Plan  Principal Problem:   STEMI 01/05/13- acute ISR of LAD stent just  placed 12/29/12 Active Problems:   HYPERLIPIDEMIA   HYPERTENSION   CAD S/P STEMI 12/29/12 Mid LAD occlusion treated with DES   SLEEP APNEA   Aspirin allergy- hives  Plan: Doing well. Denies any chest pain/SOB. Ambulating w/o difficulty. Plan to d/c home today on Effient, BB, ACE-I and statin. Plan to resume OP cardiac rehab. Will f/u in clinic next week.    LOS: 3 days    Brittainy M. Sharol Harness, PA-C 01/08/2013 9:34 AM  I have seen the patient this AM with the PA.  He looks & feels good.  Tele stable. ECG with minimal evolutionary changes.  No further angina or SOB.  Walking well. On good regimen - converted to Effient from Brilinta due to In-stent thrombosis.  ASA allergy.  BP & HR stable.  Agree with plan to d/c today - has OP f/u scheduled.  Marykay Lex, MD

## 2013-01-08 NOTE — Discharge Summary (Signed)
Physician Discharge Summary  Patient ID: Brendan Butler MRN: [id number] DOB/AGE: 1950-10-27 62 y.o.  Admit date: 01/05/2013 Discharge date: 01/08/2013  Admission Diagnoses: STEMI / Acute In-Stent Thrombosis   Discharge Diagnoses:  Principal Problem:   Subsequent ST elevation (STEMI) myocardial infarction of anterior wall within 4 weeks of initial infarction Active Problems:   HYPERLIPIDEMIA   HYPERTENSION   CAD S/P STEMI 12/29/12 Mid LAD occlusion treated with DES   SLEEP APNEA   STEMI 01/05/13- acute ISR of LAD stent just placed 12/29/12   Aspirin allergy- hives   Illiteracy and low-level literacy   Discharged Condition: good  Hospital Course:  The patient is a 62 y/o male, s/p anterior STEMI on 12/29/12, who presented back to [facility], one week later, on 01/05/13 with another STEMI. He is followed by Dr. Allyson Sabal. On 12/29/12 he underwent emergent PCI to the mid LAD, utilizing a DES. The procedure was performed by Dr. Eldridge Dace. He was placed on mono-antiplatelet therapy with Brilinta. He was not placed on ASA due to an allergy. A 2D echo was obtained 2 days post STEMI and demonstrated an EF of 35-40% with large anteroapical WMA. He was sent home 01/01/13. He was discharged on Brilinta, Toprol, Lisinopril, Lipitor, Zetia and Lasix.   When he returned, the patient was apparently in his normal state of health until developing severe substernal chest pressure roughly 1 hour prior to arrival on 11/10 . EMS was called. On arrival, an EKG demonstrated ST elevations. Code STEMI was activated and the patient was transported urgently to the [facility]. The procedure was performed by Dr. Excell Seltzer. On the Cath Lab table he developed ventricular fibrillation 3 times, each time with rapid defibrillation back to sinus rhythm. He was treated with intravenous amiodarone boluses followed by a drip. He was found to have acute in-stent thrombosis of the newly placed LAD stent. The LAD was totally occluded in the  proximal vessel at the proximal edge of the stented segment.  The lesion was predilated with a 2.5 mm balloon followed by a 3.0 mm noncompliant balloon. Intravascular ultrasound was then performed in order to define the mechanism of stent thrombosis. The previously implanted stent appeared well expanded throughout. There was an area of mal apposition in the distal stent. Dr. Excell Seltzer elected to dilate the entire stented segment again with a 3.5 mm noncompliant balloon. Following PCI, there was 0% residual stenosis and TIMI-3 flow. Final angiography confirmed an excellent result.  The patient tolerated the PCI procedure well. His chest pain resolved. There were no immediate post procedural complications. The patient was transferred to the post catheterization recovery area for further monitoring, then was sent to the CCU. His Brilinta was discontinued and he was placed on Effient, as the patient has no past history of CVA/TIA. The right femoral access site remained stable, free from hematoma and bruit. Post-cath EKG demonstrated normalization of ST elevations. Troponin levels trended downward. He had no further arrhthymias on telemetry and Amiodarone was discontinued. A 2D echo on 01/06/13  demonstrated mild-moderately reduced systolic function. His EF was estimated at 40-45%. There was anterior, anteroseptal and apical hypokinesis, consistent with LAD territory ischemia/infarct.  Both his BB and ACE-I were titrated upwards.  He continued to do well and was transitioned from the CCU, to stepdown, then telemetry.  He underwent phase 1 cardiac rehab without difficulty. A referral was placed for him to transition to phase 2 cardiac rehab as an outpatient.  He was last seen and examined by  Dr. Herbie Baltimore, who determined he was stable for discharge. He was discharged home on Effient, Toprol XL, lisinopril, Lipitor, Zetia, Lasix and PRN NTG.   At time of discharge, the patient expressed difficulty managing his medications at  home. While reviewing his discharge instructions and medication orders, it was discovered that the patient has a low literacy level. Due to the fact that he lives home alone, there was concern that his low literacy level may interfere with medication compliance. Case Management was consulted to arrange for a home health nurse to visit with the patient weekly, to help organize his medicines correctly[facility] will [facility] his Effient in a special red colored pill top, so that the patient will remember to take it every day without fail. He has been instructed to bring all of his medications with him to his post-hospital follow-up visit, to review his meds and ensure that he is taking them correctly. He will follow-up with Boyce Medici, PA-C, on 01/14/13. Then he will follow-up with Dr. Allyson Sabal in 6 weeks.   Consults: cardiology  Significant Diagnostic Studies:   Emergent LHC + PCI 01/05/13 PCI Data:  Vessel - LAD/Segment - proximal (in-stent)  Percent Stenosis (pre) 100  TIMI-flow 0  Stent none. Primary device was a 3.5 mm noncompliant balloon  Percent Stenosis (post) 0  TIMI-flow (post) 3  Final Conclusions:  1. Acute anterior wall myocardial infarction secondary to LAD stent thrombosis, treated successfully with IVUS-guided PCI  2. Patency of the left circumflex   2D Echo 01/06/13 Study Conclusions  - Left ventricle: The cavity size was normal. Wall thickness was normal. Systolic function was mildly to moderately reduced. The estimated ejection fraction was in the range of 40% to 45%. There is anterior, anteroseptal and apical hypokinesis suggestive of LAD territory ischemia/infarct. Doppler parameters are consistent with abnormal left ventricular relaxation (grade 1 diastolic dysfunction).   Treatments: See Hospital Course  Discharge Exam: Blood pressure 126/54, pulse 61, temperature 98 F (36.7 C), temperature source Oral, resp. rate 18, height 5'  10" (1.778 m), weight 217 lb (98.431 kg), SpO2 98.00%.  Disposition: 01-Home or Self Care      Discharge Orders   Future Appointments Provider Department Dept Phone   01/14/2013 2:00 PM Brittainy [name] [facility] [phone]   Future Orders Complete By Expires   Amb Referral to Cardiac Rehabilitation  As directed    Comments:     Referring to [place] Phase 2   Diet - low sodium heart healthy  As directed    Driving Restrictions  As directed    Comments:     No driving until seen in follow-up   Increase activity slowly  As directed    Lifting restrictions  As directed    Comments:     No heavy lifting until seen in follow-up       Medication List    STOP taking these medications       Ticagrelor 90 MG Tabs tablet  Commonly known as:  BRILINTA      TAKE these medications       ALPRAZolam 1 MG tablet  Commonly known as:  XANAX  Take 1 tablet (1 mg total) by mouth at bedtime.     atorvastatin 80 MG tablet  Commonly known as:  LIPITOR  Take 1 tablet (80 mg total) by mouth daily at 6 PM.     buPROPion 300 MG 24 hr tablet  Commonly known as:  WELLBUTRIN  XL  Take 300 mg by mouth daily.     DULoxetine 60 MG capsule  Commonly known as:  CYMBALTA  Take 60 mg by mouth daily.     ezetimibe 10 MG tablet  Commonly known as:  ZETIA  Take 10 mg by mouth at bedtime.     fluticasone 50 MCG/ACT nasal spray  Commonly known as:  FLONASE  Place 2 sprays into the nose daily as needed for allergies.     furosemide 20 MG tablet  Commonly known as:  LASIX  Take 1 tablet (20 mg total) by mouth daily.     lisinopril 2.5 MG tablet  Commonly known as:  PRINIVIL,ZESTRIL  Take 2 tablets (5 mg total) by mouth daily.     metoprolol succinate 25 MG 24 hr tablet  Commonly known as:  TOPROL-XL  Take 2 tablets (50 mg total) by mouth daily.     nitroGLYCERIN 0.4 MG SL tablet  Commonly known as:  NITROSTAT  Place 1 tablet (0.4 mg total) under the tongue every 5  (five) minutes x 3 doses as needed for chest pain.     prasugrel 10 MG Tabs tablet  Commonly known as:  EFFIENT  Take 1 tablet (10 mg total) by mouth daily.     prasugrel 10 MG Tabs tablet  Commonly known as:  EFFIENT  Take 1 tablet (10 mg total) by mouth daily.     ranitidine 300 MG tablet  Commonly known as:  ZANTAC  Take 300 mg by mouth every other day.       Follow-up Information   Follow up with [name], PA-C On 01/14/2013. (2:00 pm )    Specialty:  Cardiology   Contact information:   [address] [place] [address] [phone]      TIME SPENT ON DISCHARGE, INCLUDING PHYSICIAN TIME: >30 MINUTES  Signed: Allayne Butcher, PA-C 01/08/2013, 12:26 PM  Admitted with recurrent Anterior STEMI due to in-stent thrombosis of recently placed DES stent for Anterior STEMI. -- s/p PTCA of recent stent (IVUS guided). EF has actually still shown improvement from initial MI.   Ambulating well.  On good regimen.  It appears that he may have been taking the wrong medication BID - not Brilinta due to inability to read.  HH set up for RN/CMA to do home visits to help arrange pills to avoid confusion.  This makes sense, as he otherwise seems very compliant.    Ready for d/c.  Marykay Lex, MD

## 2013-01-08 NOTE — Progress Notes (Signed)
01/07/13 11p-7a shift. Pt.A/Ox4. He had no c/o pain and no signs of distress. He can ambulate without assist. Possible discharge this am.

## 2013-01-08 NOTE — Plan of Care (Signed)
Problem: Discharge Progression Outcomes Goal: Activity plan per MD/Cardiac Rehab Outcome: Completed/Met Date Met:  01/08/13 Home health with [facility] then back to outpt. At [facility]

## 2013-01-12 ENCOUNTER — Encounter: Payer: Self-pay | Admitting: Cardiology

## 2013-01-13 ENCOUNTER — Encounter: Payer: Self-pay | Admitting: Cardiovascular Disease

## 2013-01-14 ENCOUNTER — Encounter: Payer: Self-pay | Admitting: Cardiology

## 2013-01-14 ENCOUNTER — Ambulatory Visit (INDEPENDENT_AMBULATORY_CARE_PROVIDER_SITE_OTHER): Payer: Medicare Other | Admitting: Cardiology

## 2013-01-14 VITALS — BP 134/82 | HR 65 | Ht 70.0 in | Wt 218.0 lb

## 2013-01-14 DIAGNOSIS — I10 Essential (primary) hypertension: Secondary | ICD-10-CM

## 2013-01-14 DIAGNOSIS — E785 Hyperlipidemia, unspecified: Secondary | ICD-10-CM

## 2013-01-14 DIAGNOSIS — Z550 Illiteracy and low-level literacy: Secondary | ICD-10-CM

## 2013-01-14 DIAGNOSIS — I220 Subsequent ST elevation (STEMI) myocardial infarction of anterior wall: Secondary | ICD-10-CM

## 2013-01-14 DIAGNOSIS — I2109 ST elevation (STEMI) myocardial infarction involving other coronary artery of anterior wall: Secondary | ICD-10-CM

## 2013-01-14 DIAGNOSIS — I2510 Atherosclerotic heart disease of native coronary artery without angina pectoris: Secondary | ICD-10-CM

## 2013-01-14 NOTE — Progress Notes (Signed)
01/15/2013 [name]   01-02-1951  [id number]  Primary Physicia No primary provider on file. Primary Cardiologist: Dr. Allyson Sabal  HPI:  The patient is a 62 y/o male, s/p anterior STEMI on 12/29/12, who presented back to [facility], one week later, on 01/05/13 with another STEMI. He is followed by Dr. Allyson Sabal. On 12/29/12 he underwent emergent PCI to the mid LAD, utilizing a DES. The procedure was performed by Dr. Eldridge Dace. He was placed on mono-antiplatelet therapy with Brilinta. He was not placed on ASA due to an allergy. A 2D echo was obtained 2 days post STEMI and demonstrated an EF of 35-40% with large anteroapical WMA. He was sent home 01/01/13. He was discharged on Brilinta, Toprol, Lisinopril, Lipitor, Zetia and Lasix.   When he returned on 11/10, the patient was apparently in his normal state of health until developing severe substernal chest pressure roughly 1 hour prior to arrival . EMS was called. On arrival, an EKG demonstrated ST elevations. Code STEMI was activated and the patient was transported urgently to the [facility]. The procedure was performed by Dr. Excell Seltzer. On the Cath Lab table he developed ventricular fibrillation 3 times, each time with rapid defibrillation back to sinus rhythm. He was treated with intravenous amiodarone boluses followed by a drip. He was found to have acute in-stent thrombosis of the newly placed LAD stent. The LAD was totally occluded in the proximal vessel at the proximal edge of the stented segment. The lesion was predilated with a 2.5 mm balloon followed by a 3.0 mm noncompliant balloon. Intravascular ultrasound was then performed in order to define the mechanism of stent thrombosis. The previously implanted stent appeared well expanded throughout. There was an area of mal apposition in the distal stent. Dr. Excell Seltzer elected to dilate the entire stented segment again with a 3.5 mm noncompliant balloon. Following PCI, there was 0% residual stenosis and TIMI-3  flow. Final angiography confirmed an excellent result. The patient tolerated the PCI procedure well. His chest pain resolved. There were no immediate post procedural complications. The patient was transferred to the post catheterization recovery area for further monitoring, then was sent to the CCU. His Brilinta was discontinued and he was placed on Effient, as the patient has no past history of CVA/TIA. The right femoral access site remained stable, free from hematoma and bruit. Post-cath EKG demonstrated normalization of ST elevations. Troponin levels trended downward. He had no further arrhthymias on telemetry and Amiodarone was discontinued. A 2D echo on 01/06/13 demonstrated mild-moderately reduced systolic function. His EF was estimated at 40-45%. There was anterior, anteroseptal and apical hypokinesis, consistent with LAD territory ischemia/infarct. Both his BB and ACE-I were titrated upwards. He was discharged home on Effient, Toprol XL, lisinopril, Lipitor, Zetia, Lasix and PRN NTG.   At time of discharge, the patient expressed difficulty managing his medications at home. While reviewing his discharge instructions and medication orders, it was discovered that the patient has a low literacy level. Due to the fact that he lives home alone, there was concern that his low literacy level may interfere with medication compliance. Case Management was consulted to arrange for a home health nurse to visit with the patient weekly, to help organize his medicines correctly. [facility] They will place his Effient in a special red colored pill top, so that the patient will remember to take it every day without fail.  He presents back for post hospital follow-up. He reports that he has been doing  well since discharge. He has had no recurrence of chest pain. He denies SOB, lightheadedness/dizziness, syncope/near syncope. He has been complaint with his medications and is enrolled in cardiac rehab.      Current Outpatient Prescriptions  Medication Sig Dispense Refill  . ALPRAZolam (XANAX) 1 MG tablet Take 1 tablet (1 mg total) by mouth at bedtime.  14 tablet  0  . atorvastatin (LIPITOR) 80 MG tablet Take 1 tablet (80 mg total) by mouth daily at 6 PM.  30 tablet  5  . buPROPion (WELLBUTRIN XL) 300 MG 24 hr tablet Take 300 mg by mouth daily.      . DULoxetine (CYMBALTA) 60 MG capsule Take 60 mg by mouth daily.      Marland Kitchen ezetimibe (ZETIA) 10 MG tablet Take 10 mg by mouth at bedtime.      . fluticasone (FLONASE) 50 MCG/ACT nasal spray Place 2 sprays into the nose daily as needed for allergies.       . furosemide (LASIX) 20 MG tablet Take 1 tablet (20 mg total) by mouth daily.  30 tablet  5  . lisinopril (PRINIVIL,ZESTRIL) 2.5 MG tablet Take 2 tablets (5 mg total) by mouth daily.  60 tablet  5  . metoprolol succinate (TOPROL-XL) 25 MG 24 hr tablet Take 2 tablets (50 mg total) by mouth daily.  60 tablet  5  . nitroGLYCERIN (NITROSTAT) 0.4 MG SL tablet Place 1 tablet (0.4 mg total) under the tongue every 5 (five) minutes x 3 doses as needed for chest pain.  25 tablet  2  . potassium chloride (K-DUR,KLOR-CON) 10 MEQ tablet Take 10 mEq by mouth 2 (two) times daily. Pt. States has not taken this med in a year because of GI  Problems      . prasugrel (EFFIENT) 10 MG TABS tablet Take 1 tablet (10 mg total) by mouth daily.  30 tablet  10  . ranitidine (ZANTAC) 300 MG tablet Take 300 mg by mouth every other day.       No current facility-administered medications for this visit.    Allergies  Allergen Reactions  . Aspirin Hives  . Levitra [Vardenafil]     Blurred vision  . Lipitor [Atorvastatin]     Muscle cramps    History   Social History  . Marital Status: Divorced    Spouse Name: N/A    Number of Children: N/A  . Years of Education: N/A   Occupational History  . Not on file.   Social History Main Topics  . Smoking status: Former Games developer  . Smokeless tobacco: Not on file  . Alcohol  Use: No  . Drug Use: No  . Sexual Activity: Not on file   Other Topics Concern  . Not on file   Social History Narrative  . No narrative on file     Review of Systems: General: negative for chills, fever, night sweats or weight changes.  Cardiovascular: negative for chest pain, dyspnea on exertion, edema, orthopnea, palpitations, paroxysmal nocturnal dyspnea or shortness of breath Dermatological: negative for rash Respiratory: negative for cough or wheezing Urologic: negative for hematuria Abdominal: negative for nausea, vomiting, diarrhea, bright red blood per rectum, melena, or hematemesis Neurologic: negative for visual changes, syncope, or dizziness All other systems reviewed and are otherwise negative except as noted above.    Blood pressure 134/82, pulse 65, height 5\' 10"  (1.778 m), weight 218 lb (98.884 kg).  General appearance: alert, cooperative and no distress Neck: no JVD Lungs: clear to  auscultation bilaterally Heart: regular rate and rhythm, S1, S2 normal, no murmur, click, rub or gallop Extremities: no LEE Pulses: 2+ and symmetric Skin: warm and dry\ Neurologic: Grossly normal  EKG NSR  ASSESSMENT AND PLAN:   Subsequent ST elevation (STEMI) myocardial infarction of anterior wall within 4 weeks of initial infarction 1 week post re-infarction, secondary to in-stent thrombosis of newly placed LAD DES. Pt is now on mono antiplatelet therapy with Effient. No ASA due to allergy. He has remained CP free. No exertional angina with cardiac rehab. Will continue antiplatelet therapy for at least 1 year. Resume BB, ACE-I and statin.    CAD S/P STEMI 12/29/12 Mid LAD occlusion treated with DES Stable on Effient, BB, ACE-I and Statin  HYPERTENSION Well controlled at visit today. Continue BB and ACE-I.   HYPERLIPIDEMIA Pt is at LDL goal of < 70. Continue Lipitor.  HDL is low. We discussed lifestyle modifications. He is working on increasing his physical activity. He is  enrolled in cardiac rehab 2 x week. He has also made dietary changes. He is scheduled to meet with a nutritionist in several days.   Illiteracy and low-level literacy Pt has been established with a home health RN that helps patient sort out his meds. He is now more confident in his ability to take his meds appropriately.     PLAN: Pt is s/p STEMI, secondary to in-stent thrombosis of a recently placed LAD stent. He appears to be doing well since discharge. He has remained chest pain free. He is enrolled in cardiac rehab 2 x a week. He is scheduled to meet with a nutritionist to assist with dietary changes. He has been receiving assistance through home health with management of his medications. This had improved compliance and he is becoming more confident in his ability to manage his medications appropriately. He seems pleased with his progressed and seems motivated to continue on with his recent lifestyle changes. I have instructed the patient that I would like for him to follow back up with Dr. Allyson Sabal in 6 weeks for repeat check-up. He was instructed to continue with his medications as prescribed. He verbalized understanding that he is to take his Effient everyday without any missed doses.     Allayne Butcher, PA-C 01/15/2013 12:03 PM

## 2013-01-14 NOTE — Assessment & Plan Note (Addendum)
Well controlled at visit today. Continue BB and ACE-I.

## 2013-01-14 NOTE — Assessment & Plan Note (Signed)
Stable on Effient, BB, ACE-I and Statin

## 2013-01-14 NOTE — Assessment & Plan Note (Addendum)
1 week post re-infarction, secondary to in-stent thrombosis of newly placed LAD DES. Pt is now on mono antiplatelet therapy with Effient. No ASA due to allergy. He has remained CP free. No exertional angina with cardiac rehab. Will continue antiplatelet therapy for at least 1 year. Resume BB, ACE-I and statin.

## 2013-01-14 NOTE — Patient Instructions (Signed)
Your blood pressure is great. Physical exam normal. Continue taking your medications as prescribed. Continue with Cardiac Rehab. Continue with your new diet plan. Keep your appointment with your primary care provider Dr. Clarene Duke. You will need to follow-up with Dr. Allyson Sabal in 2 months. Call our office if you have any problems before then.

## 2013-01-15 NOTE — Assessment & Plan Note (Signed)
Pt has been established with a home health RN that helps patient sort out his meds. He is now more confident in his ability to take his meds appropriately.

## 2013-01-15 NOTE — Assessment & Plan Note (Signed)
Pt is at LDL goal of < 70. Continue Lipitor.  HDL is low. We discussed lifestyle modifications. He is working on increasing his physical activity. He is enrolled in cardiac rehab 2 x week. He has also made dietary changes. He is scheduled to meet with a nutritionist in several days.

## 2013-02-04 ENCOUNTER — Telehealth: Payer: Self-pay | Admitting: Cardiovascular Disease

## 2013-02-04 NOTE — Telephone Encounter (Signed)
Please call-need authorization for one of his medicine. °

## 2013-02-04 NOTE — Telephone Encounter (Signed)
Message forwarded to K. Vogel, RN.  

## 2013-02-04 NOTE — Telephone Encounter (Signed)
The name of the medicine he need authorization for is Effient 10 mg.

## 2013-02-04 NOTE — Telephone Encounter (Signed)
Please call-need authorization for one of his medicine.

## 2013-02-05 NOTE — Telephone Encounter (Signed)
I spoke with patient and made him aware of what was going on.  I provided him with samples of Effient and stressed the importance of not running out of this medicine!!

## 2013-02-05 NOTE — Telephone Encounter (Signed)
I called pharmacy to get the PA number [phone]) member id number is [id number].  The PA needs to be done on Effient.    I called the PA number and submitted a claim over the phone. They will get back the response to be within 48hours.  The ref # [id number].

## 2013-02-10 NOTE — Telephone Encounter (Signed)
Received paper from [facility] saying that the PA on effient was denied because their records showed that he was taking plavix and effient...even though he was NOT.  I called [employer] and spoke with Katherina Right and he said that the prescription will now go through and a PA is not needed.    I called [facility] and made them aware.  They ran a claim and it went through.  They will contact the patient.

## 2013-02-24 ENCOUNTER — Ambulatory Visit (INDEPENDENT_AMBULATORY_CARE_PROVIDER_SITE_OTHER): Payer: Medicare Other | Admitting: Cardiovascular Disease

## 2013-02-24 ENCOUNTER — Encounter: Payer: Self-pay | Admitting: Cardiovascular Disease

## 2013-02-24 VITALS — BP 142/72 | HR 64 | Ht 70.0 in | Wt 227.0 lb

## 2013-02-24 DIAGNOSIS — I10 Essential (primary) hypertension: Secondary | ICD-10-CM

## 2013-02-24 DIAGNOSIS — E785 Hyperlipidemia, unspecified: Secondary | ICD-10-CM

## 2013-02-24 DIAGNOSIS — I2510 Atherosclerotic heart disease of native coronary artery without angina pectoris: Secondary | ICD-10-CM

## 2013-02-24 DIAGNOSIS — I220 Subsequent ST elevation (STEMI) myocardial infarction of anterior wall: Secondary | ICD-10-CM

## 2013-02-24 DIAGNOSIS — I2109 ST elevation (STEMI) myocardial infarction involving other coronary artery of anterior wall: Secondary | ICD-10-CM

## 2013-02-24 NOTE — Assessment & Plan Note (Signed)
On statin therapy with recent lipid profile performed 01/05/13 revealing a glucose of 103, LDL of 52 HDL 32

## 2013-02-24 NOTE — Assessment & Plan Note (Signed)
Well-controlled on current medications 

## 2013-02-24 NOTE — Assessment & Plan Note (Signed)
Mr. Eissler suffered an acute anterior wall myocardial infarction in early November treated with PCI and stenting by Dr. Eldridge Dace of his LAD. He presented one week later with acute stent thrombosis and reinfarction. He was studied by Dr. Excell Seltzer revealing the stent to be thrombosed and he was weaned to be drawn. Procedure was complicated by ventricular fibrillation 3 times requiring defibrillation. The patient was ultimately discharged home on Effient . He was previously on [place]. He is aspirin allergic. He does say that he's been compliant with his medications.he is exercising daily. He has changed his diet. He denies chest pain or shortness of breath. Because he's had acute episodes involving his anterior wall I'm going to get a 2-D echocardiogram to look for LV function and to make sure he does not have any LV mural thrombus.

## 2013-02-24 NOTE — Progress Notes (Signed)
02/24/2013 Brendan Butler   1950-06-28  [id number]  Primary Physician Brendan Puffer, MD Primary Cardiologist: Brendan Gess MD Brendan Butler   HPI:  The patient is a 62 y/o male, s/p anterior STEMI on 12/29/12, who presented back to [facility], one week later, on 01/05/13 with another STEMI. He is followed by Dr. Allyson Butler. On 12/29/12 he underwent emergent PCI to the mid LAD, utilizing a DES. The procedure was performed by Dr. Eldridge Butler. He was placed on mono-antiplatelet therapy with Brilinta. He was not placed on ASA due to an allergy. A 2D echo was obtained 2 days post STEMI and demonstrated an EF of 35-40% with large anteroapical WMA. He was sent home 01/01/13. He was discharged on Brilinta, Toprol, Lisinopril, Lipitor, Zetia and Lasix.  When he returned on 11/10, the patient was apparently in his normal state of health until developing severe substernal chest pressure roughly 1 hour prior to arrival . EMS was called. On arrival, an EKG demonstrated ST elevations. Code STEMI was activated and the patient was transported urgently to the [facility]. The procedure was performed by Dr. Excell Butler. On the Cath Lab table he developed ventricular fibrillation 3 times, each time with rapid defibrillation back to sinus rhythm. He was treated with intravenous amiodarone boluses followed by a drip. He was found to have acute in-stent thrombosis of the newly placed LAD stent. The LAD was totally occluded in the proximal vessel at the proximal edge of the stented segment. The lesion was predilated with a 2.5 mm balloon followed by a 3.0 mm noncompliant balloon. Intravascular ultrasound was then performed in order to define the mechanism of stent thrombosis. The previously implanted stent appeared well expanded throughout. There was an area of mal apposition in the distal stent. Dr. Excell Butler elected to dilate the entire stented segment again with a 3.5 mm noncompliant balloon. Following PCI, there was 0%  residual stenosis and TIMI-3 flow. Final angiography confirmed an excellent result. The patient tolerated the PCI procedure well. His chest pain resolved. There were no immediate post procedural complications. The patient was transferred to the post catheterization recovery area for further monitoring, then was sent to the CCU. His Brilinta was discontinued and he was placed on Effient, as the patient has no past history of CVA/TIA. The right femoral access site remained stable, free from hematoma and bruit. Post-cath EKG demonstrated normalization of ST elevations. Troponin levels trended downward. He had no further arrhthymias on telemetry and Amiodarone was discontinued. A 2D echo on 01/06/13 demonstrated mild-moderately reduced systolic function. His EF was estimated at 40-45%. There was anterior, anteroseptal and apical hypokinesis, consistent with LAD territory ischemia/infarct. Both his BB and ACE-I were titrated upwards. He was discharged home on Effient, Toprol XL, lisinopril, Lipitor, Zetia, Lasix and PRN NTG.  At time of discharge, the patient expressed difficulty managing his medications at home. While reviewing his discharge instructions and medication orders, it was discovered that the patient has a low literacy level. Due to the fact that he lives home alone, there was concern that his low literacy level may interfere with medication compliance. Case Management was consulted to arrange for a home health nurse to visit with the patient weekly, to help organize his medicines correctly. [facility] They will place his Effient in a special red colored pill top, so that the patient will remember to take it every day without fail.  He saw Brendan Lis, PA-C, in the office post discharge and  was compliant with his medications and participating in cardiac rehabilitation. He does exercise every day at home. He denies chest pain or shortness of breath.   Current Outpatient  Prescriptions  Medication Sig Dispense Refill  . ALPRAZolam (XANAX) 1 MG tablet Take 1 tablet (1 mg total) by mouth at bedtime.  14 tablet  0  . atorvastatin (LIPITOR) 80 MG tablet Take 1 tablet (80 mg total) by mouth daily at 6 PM.  30 tablet  5  . buPROPion (WELLBUTRIN XL) 300 MG 24 hr tablet Take 300 mg by mouth daily.      Marland Kitchen ezetimibe (ZETIA) 10 MG tablet Take 10 mg by mouth at bedtime.      . furosemide (LASIX) 20 MG tablet Take 1 tablet (20 mg total) by mouth daily.  30 tablet  5  . lisinopril (PRINIVIL,ZESTRIL) 2.5 MG tablet Take 2 tablets (5 mg total) by mouth daily.  60 tablet  5  . metoprolol succinate (TOPROL-XL) 25 MG 24 hr tablet Take 2 tablets (50 mg total) by mouth daily.  60 tablet  5  . nitroGLYCERIN (NITROSTAT) 0.4 MG SL tablet Place 1 tablet (0.4 mg total) under the tongue every 5 (five) minutes x 3 doses as needed for chest pain.  25 tablet  2  . potassium chloride (K-DUR,KLOR-CON) 10 MEQ tablet Take 10 mEq by mouth daily. Pt. States has not taken this med in a year because of GI  Problems      . prasugrel (EFFIENT) 10 MG TABS tablet Take 1 tablet (10 mg total) by mouth daily.  30 tablet  10  . ranitidine (ZANTAC) 300 MG tablet Take 300 mg by mouth daily.       . sertraline (ZOLOFT) 50 MG tablet Take 50 mg by mouth daily.       . fluticasone (FLONASE) 50 MCG/ACT nasal spray Place 2 sprays into the nose daily as needed for allergies.        No current facility-administered medications for this visit.    Allergies  Allergen Reactions  . Aspirin Hives  . Levitra [Vardenafil]     Blurred vision  . Lipitor [Atorvastatin]     Muscle cramps    History   Social History  . Marital Status: Divorced    Spouse Name: N/A    Number of Children: N/A  . Years of Education: N/A   Occupational History  . Not on file.   Social History Main Topics  . Smoking status: Former Games developer  . Smokeless tobacco: Not on file  . Alcohol Use: No  . Drug Use: No  . Sexual Activity: Not  on file   Other Topics Concern  . Not on file   Social History Narrative  . No narrative on file     Review of Systems: General: negative for chills, fever, night sweats or weight changes.  Cardiovascular: negative for chest pain, dyspnea on exertion, edema, orthopnea, palpitations, paroxysmal nocturnal dyspnea or shortness of breath Dermatological: negative for rash Respiratory: negative for cough or wheezing Urologic: negative for hematuria Abdominal: negative for nausea, vomiting, diarrhea, bright red blood per rectum, melena, or hematemesis Neurologic: negative for visual changes, syncope, or dizziness All other systems reviewed and are otherwise negative except as noted above.    Blood pressure 142/72, pulse 64, height 5\' 10"  (1.778 m), weight 227 lb (102.967 kg).  General appearance: alert and no distress Neck: no adenopathy, no carotid bruit, no JVD, supple, symmetrical, trachea midline and thyroid not enlarged, symmetric,  no tenderness/mass/nodules Lungs: clear to auscultation bilaterally Heart: regular rate and rhythm, S1, S2 normal, no murmur, click, rub or gallop Extremities: extremities normal, atraumatic, no cyanosis or edema  EKG not performed today  ASSESSMENT AND PLAN:   Subsequent ST elevation (STEMI) myocardial infarction of anterior wall within 4 weeks of initial infarction Mr. Mario suffered an acute anterior wall myocardial infarction in early November treated with PCI and stenting by Dr. Eldridge Butler of his LAD. He presented one week later with acute stent thrombosis and reinfarction. He was studied by Dr. Excell Butler revealing the stent to be thrombosed and he was weaned to be drawn. Procedure was complicated by ventricular fibrillation 3 times requiring defibrillation. The patient was ultimately discharged home on Effient . He was previously on [place]. He is aspirin allergic. He does say that he's been compliant with his medications.he is exercising daily. He has  changed his diet. He denies chest pain or shortness of breath. Because he's had acute episodes involving his anterior wall I'm going to get a 2-D echocardiogram to look for LV function and to make sure he does not have any LV mural thrombus.  HYPERLIPIDEMIA On statin therapy with recent lipid profile performed 01/05/13 revealing a glucose of 103, LDL of 52 HDL 32  HYPERTENSION Well-controlled on current medications      Brendan Gess MD [facility], [facility] 02/24/2013 9:49 AM

## 2013-02-24 NOTE — Patient Instructions (Signed)
Your physician wants you to follow-up in: 3 months with [place] PA and 6 months with Dr Allyson Sabal. You will receive a reminder letter in the mail two months in advance. If you don't receive a letter, please call our office to schedule the follow-up appointment.  Dr Allyson Sabal has ordered an echocardiogram.

## 2013-03-09 ENCOUNTER — Ambulatory Visit (HOSPITAL_COMMUNITY)
Admission: RE | Admit: 2013-03-09 | Discharge: 2013-03-09 | Disposition: A | Discharge status: Home / Self Care | Payer: Medicare Other | Source: Ambulatory Visit | Attending: Cardiology | Admitting: Cardiology

## 2013-03-09 DIAGNOSIS — I252 Old myocardial infarction: Secondary | ICD-10-CM | POA: Insufficient documentation

## 2013-03-09 DIAGNOSIS — I2510 Atherosclerotic heart disease of native coronary artery without angina pectoris: Secondary | ICD-10-CM | POA: Insufficient documentation

## 2013-03-09 DIAGNOSIS — I77819 Aortic ectasia, unspecified site: Secondary | ICD-10-CM | POA: Insufficient documentation

## 2013-03-09 NOTE — Progress Notes (Signed)
2D Echo Performed 03/09/2013    Linden Mikes, RCS  

## 2013-03-11 ENCOUNTER — Other Ambulatory Visit (HOSPITAL_COMMUNITY): Payer: Self-pay | Admitting: Cardiology

## 2013-03-11 NOTE — Telephone Encounter (Signed)
Rx was sent to pharmacy electronically. 

## 2013-04-06 ENCOUNTER — Telehealth: Payer: Self-pay | Admitting: Cardiovascular Disease

## 2013-04-06 NOTE — Telephone Encounter (Signed)
Returned call and pt verified x 2.  Pt stated he has an abscessed tooth and saw dentist yesterday.  Stated he was put on an abx.  Stated his pain is 10/10 on pain scale.  Stated the dentist wanted him to call to find out if it's okay for him to take Vicodin for pain.  Pt informed it would be up to the dentist to prescribe Vicodin and informed he should use caution with anti-inflammatory medications as they will increase his risk of bleeding while on the blood thinner, Effient.  Pt verbalized understanding and agreed w/ plan.  Pt will relay info to dentist and stated she may call back.

## 2013-04-06 NOTE — Telephone Encounter (Signed)
He has an abscess tooth ,he is in so much pain ,face swollen, and can not sleep. His dentist told him to call Dr Allyson Sabal and see what he can take for pain?

## 2013-04-21 ENCOUNTER — Inpatient Hospital Stay (HOSPITAL_COMMUNITY): Payer: Medicare Other

## 2013-04-21 ENCOUNTER — Emergency Department (HOSPITAL_COMMUNITY): Payer: Medicare Other

## 2013-04-21 ENCOUNTER — Encounter (HOSPITAL_COMMUNITY): Payer: Self-pay | Admitting: Emergency Medicine

## 2013-04-21 ENCOUNTER — Inpatient Hospital Stay (HOSPITAL_COMMUNITY)
Admission: EM | Admit: 2013-04-21 | Discharge: 2013-04-23 | DRG: 250 | Disposition: A | Discharge status: Home / Self Care | Payer: Medicare Other | Attending: Cardiology | Admitting: Cardiology

## 2013-04-21 DIAGNOSIS — Z79899 Other long term (current) drug therapy: Secondary | ICD-10-CM

## 2013-04-21 DIAGNOSIS — I200 Unstable angina: Secondary | ICD-10-CM | POA: Diagnosis present

## 2013-04-21 DIAGNOSIS — I4901 Ventricular fibrillation: Secondary | ICD-10-CM | POA: Diagnosis not present

## 2013-04-21 DIAGNOSIS — D72829 Elevated white blood cell count, unspecified: Secondary | ICD-10-CM | POA: Diagnosis present

## 2013-04-21 DIAGNOSIS — G4733 Obstructive sleep apnea (adult) (pediatric): Secondary | ICD-10-CM | POA: Diagnosis present

## 2013-04-21 DIAGNOSIS — T82897A Other specified complication of cardiac prosthetic devices, implants and grafts, initial encounter: Principal | ICD-10-CM | POA: Diagnosis present

## 2013-04-21 DIAGNOSIS — Z550 Illiteracy and low-level literacy: Secondary | ICD-10-CM

## 2013-04-21 DIAGNOSIS — G4730 Sleep apnea, unspecified: Secondary | ICD-10-CM | POA: Diagnosis present

## 2013-04-21 DIAGNOSIS — I2510 Atherosclerotic heart disease of native coronary artery without angina pectoris: Secondary | ICD-10-CM | POA: Diagnosis present

## 2013-04-21 DIAGNOSIS — Z87891 Personal history of nicotine dependence: Secondary | ICD-10-CM

## 2013-04-21 DIAGNOSIS — E785 Hyperlipidemia, unspecified: Secondary | ICD-10-CM | POA: Diagnosis present

## 2013-04-21 DIAGNOSIS — Z886 Allergy status to analgesic agent status: Secondary | ICD-10-CM

## 2013-04-21 DIAGNOSIS — Y849 Medical procedure, unspecified as the cause of abnormal reaction of the patient, or of later complication, without mention of misadventure at the time of the procedure: Secondary | ICD-10-CM | POA: Diagnosis present

## 2013-04-21 DIAGNOSIS — I10 Essential (primary) hypertension: Secondary | ICD-10-CM | POA: Diagnosis present

## 2013-04-21 DIAGNOSIS — I498 Other specified cardiac arrhythmias: Secondary | ICD-10-CM | POA: Diagnosis present

## 2013-04-21 DIAGNOSIS — I213 ST elevation (STEMI) myocardial infarction of unspecified site: Secondary | ICD-10-CM

## 2013-04-21 DIAGNOSIS — I252 Old myocardial infarction: Secondary | ICD-10-CM

## 2013-04-21 DIAGNOSIS — Z888 Allergy status to other drugs, medicaments and biological substances status: Secondary | ICD-10-CM

## 2013-04-21 LAB — I-STAT TROPONIN, ED: Troponin i, poc: 0.01 ng/mL (ref 0.00–0.08)

## 2013-04-21 LAB — BASIC METABOLIC PANEL
BUN: 15 mg/dL (ref 6–23)
CO2: 27 meq/L (ref 19–32)
Calcium: 9.1 mg/dL (ref 8.4–10.5)
Chloride: 100 mEq/L (ref 96–112)
Creatinine, Ser: 1.06 mg/dL (ref 0.50–1.35)
GFR calc Af Amer: 86 mL/min — ABNORMAL LOW (ref >90)
GFR calc non Af Amer: 74 mL/min — ABNORMAL LOW (ref >90)
GLUCOSE: 102 mg/dL — AB (ref 70–99)
POTASSIUM: 4.3 meq/L (ref 3.7–5.3)
Sodium: 138 mEq/L (ref 137–147)

## 2013-04-21 LAB — CBC WITH DIFFERENTIAL/PLATELET
Basophils Absolute: 0 10*3/uL (ref 0.0–0.1)
Basophils Relative: 0 % (ref 0–1)
Eosinophils Absolute: 0.1 10*3/uL (ref 0.0–0.7)
Eosinophils Relative: 1 % (ref 0–5)
HCT: 41.3 % (ref 39.0–52.0)
Hemoglobin: 14.3 g/dL (ref 13.0–17.0)
Lymphocytes Relative: 8 % — ABNORMAL LOW (ref 12–46)
Lymphs Abs: 1.6 10*3/uL (ref 0.7–4.0)
MCH: 30.3 pg (ref 26.0–34.0)
MCHC: 34.6 g/dL (ref 30.0–36.0)
MCV: 87.5 fL (ref 78.0–100.0)
Monocytes Absolute: 2.1 10*3/uL — ABNORMAL HIGH (ref 0.1–1.0)
Monocytes Relative: 11 % (ref 3–12)
Neutro Abs: 15.7 10*3/uL — ABNORMAL HIGH (ref 1.7–7.7)
Neutrophils Relative %: 80 % — ABNORMAL HIGH (ref 43–77)
Platelets: 251 10*3/uL (ref 150–400)
RBC: 4.72 MIL/uL (ref 4.22–5.81)
RDW: 12.3 % (ref 11.5–15.5)
WBC: 19.5 10*3/uL — ABNORMAL HIGH (ref 4.0–10.5)

## 2013-04-21 LAB — HEPARIN LEVEL (UNFRACTIONATED): Heparin Unfractionated: 0.45 IU/mL (ref 0.30–0.70)

## 2013-04-21 LAB — TROPONIN I
Troponin I: <0.3 ng/mL (ref <0.30)
Troponin I: <0.3 ng/mL (ref <0.30)

## 2013-04-21 LAB — CBC
HEMATOCRIT: 40.4 % (ref 39.0–52.0)
HEMOGLOBIN: 13.9 g/dL (ref 13.0–17.0)
MCH: 30 pg (ref 26.0–34.0)
MCHC: 34.4 g/dL (ref 30.0–36.0)
MCV: 87.3 fL (ref 78.0–100.0)
Platelets: 244 10*3/uL (ref 150–400)
RBC: 4.63 MIL/uL (ref 4.22–5.81)
RDW: 12.4 % (ref 11.5–15.5)
WBC: 16.6 10*3/uL — ABNORMAL HIGH (ref 4.0–10.5)

## 2013-04-21 LAB — APTT: aPTT: 28 seconds (ref 24–37)

## 2013-04-21 LAB — PROTIME-INR
INR: 0.95 (ref 0.00–1.49)
Prothrombin Time: 12.5 seconds (ref 11.6–15.2)

## 2013-04-21 LAB — MRSA PCR SCREENING: MRSA by PCR: NEGATIVE

## 2013-04-21 MED ORDER — NITROGLYCERIN 0.4 MG SL SUBL
0.4000 mg | SUBLINGUAL_TABLET | SUBLINGUAL | Status: DC | PRN
  Filled 2013-04-21: qty 25

## 2013-04-21 MED ORDER — SODIUM CHLORIDE 0.9 % IV BOLUS (SEPSIS)
1000.0000 mL | Freq: Once | INTRAVENOUS | Status: AC
  Administered 2013-04-21: 1000 mL via INTRAVENOUS

## 2013-04-21 MED ORDER — ATORVASTATIN CALCIUM 80 MG PO TABS
80.0000 mg | ORAL_TABLET | Freq: Every day | ORAL | Status: DC
  Administered 2013-04-21 – 2013-04-22 (×2): 80 mg via ORAL
  Filled 2013-04-21 (×3): qty 1

## 2013-04-21 MED ORDER — ACETAMINOPHEN 325 MG PO TABS: 650.0000 mg | ORAL_TABLET | ORAL | Status: DC | PRN

## 2013-04-21 MED ORDER — FENTANYL CITRATE 0.05 MG/ML IJ SOLN
25.0000 ug | Freq: Once | INTRAMUSCULAR | Status: AC
  Administered 2013-04-21: 25 ug via INTRAVENOUS
  Filled 2013-04-21: qty 2

## 2013-04-21 MED ORDER — ADULT MULTIVITAMIN W/MINERALS CH
1.0000 | ORAL_TABLET | Freq: Every day | ORAL | Status: DC
  Administered 2013-04-22 – 2013-04-23 (×2): 1 via ORAL
  Filled 2013-04-21 (×2): qty 1

## 2013-04-21 MED ORDER — POTASSIUM CHLORIDE CRYS ER 10 MEQ PO TBCR
10.0000 meq | EXTENDED_RELEASE_TABLET | Freq: Every day | ORAL | Status: DC
  Administered 2013-04-22 – 2013-04-23 (×2): 10 meq via ORAL
  Filled 2013-04-21 (×2): qty 1

## 2013-04-21 MED ORDER — ZOLPIDEM TARTRATE 5 MG PO TABS: 5.0000 mg | ORAL_TABLET | Freq: Every evening | ORAL | Status: DC | PRN

## 2013-04-21 MED ORDER — FLUTICASONE PROPIONATE 50 MCG/ACT NA SUSP: 2.0000 | Freq: Every day | NASAL | Status: DC | PRN

## 2013-04-21 MED ORDER — SODIUM CHLORIDE 0.9 % IV SOLN
1.0000 mL/kg/h | INTRAVENOUS | Status: DC
  Administered 2013-04-21: 1 mL/kg/h via INTRAVENOUS

## 2013-04-21 MED ORDER — ONDANSETRON HCL 4 MG/2ML IJ SOLN
4.0000 mg | Freq: Four times a day (QID) | INTRAMUSCULAR | Status: DC | PRN
  Administered 2013-04-21 – 2013-04-22 (×2): 4 mg via INTRAVENOUS
  Filled 2013-04-21 (×2): qty 2

## 2013-04-21 MED ORDER — MORPHINE SULFATE 4 MG/ML IJ SOLN
3.0000 mg | Freq: Once | INTRAMUSCULAR | Status: AC
  Administered 2013-04-21: 3 mg via INTRAVENOUS
  Filled 2013-04-21: qty 1

## 2013-04-21 MED ORDER — FAMOTIDINE 20 MG PO TABS
20.0000 mg | ORAL_TABLET | Freq: Every day | ORAL | Status: DC
  Administered 2013-04-22 – 2013-04-23 (×2): 20 mg via ORAL
  Filled 2013-04-21 (×2): qty 1

## 2013-04-21 MED ORDER — NITROGLYCERIN IN D5W 200-5 MCG/ML-% IV SOLN
3.0000 ug/min | INTRAVENOUS | Status: DC
  Administered 2013-04-21: 5 ug/min via INTRAVENOUS
  Filled 2013-04-21: qty 250

## 2013-04-21 MED ORDER — ALPRAZOLAM 0.25 MG PO TABS: 1.0000 mg | ORAL_TABLET | Freq: Two times a day (BID) | ORAL | Status: DC

## 2013-04-21 MED ORDER — ALPRAZOLAM 0.25 MG PO TABS: 0.2500 mg | ORAL_TABLET | Freq: Two times a day (BID) | ORAL | Status: DC | PRN

## 2013-04-21 MED ORDER — HEPARIN BOLUS VIA INFUSION
4000.0000 [IU] | Freq: Once | INTRAVENOUS | Status: AC
  Administered 2013-04-21: 4000 [IU] via INTRAVENOUS
  Filled 2013-04-21: qty 4000

## 2013-04-21 MED ORDER — METOPROLOL SUCCINATE ER 25 MG PO TB24
25.0000 mg | ORAL_TABLET | Freq: Every day | ORAL | Status: DC
  Administered 2013-04-21 – 2013-04-23 (×3): 25 mg via ORAL
  Filled 2013-04-21 (×3): qty 1

## 2013-04-21 MED ORDER — PRASUGREL HCL 10 MG PO TABS
10.0000 mg | ORAL_TABLET | Freq: Every day | ORAL | Status: DC
  Filled 2013-04-21: qty 1

## 2013-04-21 MED ORDER — DIAZEPAM 5 MG PO TABS
5.0000 mg | ORAL_TABLET | ORAL | Status: AC
  Administered 2013-04-22: 5 mg via ORAL
  Filled 2013-04-21: qty 1

## 2013-04-21 MED ORDER — EZETIMIBE 10 MG PO TABS
10.0000 mg | ORAL_TABLET | Freq: Every day | ORAL | Status: DC
  Administered 2013-04-21 – 2013-04-22 (×2): 10 mg via ORAL
  Filled 2013-04-21 (×3): qty 1

## 2013-04-21 MED ORDER — FUROSEMIDE 20 MG PO TABS
20.0000 mg | ORAL_TABLET | Freq: Every day | ORAL | Status: DC
  Administered 2013-04-22 – 2013-04-23 (×2): 20 mg via ORAL
  Filled 2013-04-21 (×2): qty 1

## 2013-04-21 MED ORDER — ALPRAZOLAM 0.5 MG PO TABS
1.0000 mg | ORAL_TABLET | Freq: Two times a day (BID) | ORAL | Status: DC | PRN
  Administered 2013-04-21: 1 mg via ORAL
  Filled 2013-04-21: qty 2

## 2013-04-21 MED ORDER — NITROGLYCERIN 0.4 MG SL SUBL: 0.4000 mg | SUBLINGUAL_TABLET | SUBLINGUAL | Status: DC | PRN

## 2013-04-21 MED ORDER — LISINOPRIL 5 MG PO TABS
5.0000 mg | ORAL_TABLET | Freq: Every day | ORAL | Status: DC
  Administered 2013-04-22 – 2013-04-23 (×2): 5 mg via ORAL
  Filled 2013-04-21 (×2): qty 1

## 2013-04-21 MED ORDER — HEPARIN (PORCINE) IN NACL 100-0.45 UNIT/ML-% IJ SOLN
1500.0000 [IU]/h | INTRAMUSCULAR | Status: DC
  Administered 2013-04-21: 1500 [IU]/h via INTRAVENOUS
  Filled 2013-04-21 (×3): qty 250

## 2013-04-21 MED ORDER — SERTRALINE HCL 50 MG PO TABS
50.0000 mg | ORAL_TABLET | Freq: Every day | ORAL | Status: DC
  Administered 2013-04-22 – 2013-04-23 (×2): 50 mg via ORAL
  Filled 2013-04-21 (×2): qty 1

## 2013-04-21 MED ORDER — TRAMADOL HCL 50 MG PO TABS
50.0000 mg | ORAL_TABLET | Freq: Four times a day (QID) | ORAL | Status: DC | PRN
  Administered 2013-04-21: 50 mg via ORAL
  Filled 2013-04-21: qty 1

## 2013-04-21 NOTE — ED Notes (Signed)
Per EMS: pt from home c/o mid sternal CP with radiation to left side starting this am that decreased with nitro; pt sts some SOB; pt with hx of MI and stents

## 2013-04-21 NOTE — ED Notes (Signed)
Pt reports sudden onset of chest pressure. Pt became diaphoretic, pale, Hr noted to drop to 42 (repeat EKG captured) and BP decreasedt o77/46. Md Silverio Lay made aware and at bedside. Pt placed on [place]. Pt AO x4.

## 2013-04-21 NOTE — ED Notes (Signed)
PT reports increase in pain and states " I am just feeling more short winded. This feels exactly like when I had my last heart attack. Pt placed on 2L [place] for comfort measures.

## 2013-04-21 NOTE — ED Notes (Signed)
Cards PA at bedside. 

## 2013-04-21 NOTE — ED Provider Notes (Signed)
CSN: [id number]     Arrival date & time 04/21/13  1316 History   First MD Initiated Contact with Patient 04/21/13 1317     Chief Complaint  Patient presents with  . Chest Pain     (Consider location/radiation/quality/duration/timing/severity/associated sxs/prior Treatment) HPI Comments: Patient is a 63 y/o male with a PMHx of CAD, acute STEMI November 2014, had coronary angioplasty with stent to LAD, hypertension and grade 1 diastolic dysfunction who presents to the emergency department complaining of sudden onset right-sided chest pain radiating towards the left beginning around 11:00 this morning while he was watching TV. Pain described as crushing, constant rated 6/10. He took one sublingual nitroglycerin which decreased his pain to a 2/10. Admits to associated shortness of breath and nausea. States this feels similar to his prior MI in November. Patient states he feels as if he is getting lightheaded and sweaty at this time, pain increased to 3/10. Cardiologist Dr. Allyson Sabal.  Patient is a 63 y.o. male presenting with chest pain. The history is provided by the patient.  Chest Pain Associated symptoms: diaphoresis, nausea and shortness of breath     Past Medical History  Diagnosis Date  . Hypertension   . Coronary artery disease 2001    stent to LAD and patent 2007 on cath  . Acute myocardial infarction of other anterior wall, initial episode of care 12/29/12    Promus stent to LAD  . Acute ST segment elevation MI 01/05/13    secondary to thrombus in stent; Brilintta changed to Effient pt with ASA allergy  . OSA (obstructive sleep apnea)   . Hyperlipidemia LDL goal <70   . Erectile dysfunction   . Echocardiogram abnormal 01/06/13    EF 45-50%, grade I diastolic dysfunction   Past Surgical History  Procedure Laterality Date  . Coronary angioplasty with stent placement  2001    to LAD  . Cardiac catheterization  2007    patent stent to LAD and normal Cors  . Coronary angioplasty  with stent placement  12/29/12    STEMI with promus DES to LAD  . Coronary angioplasty with stent placement  01/05/13    STEMI with thrombosis in stent to LAD  . Back surgery  2007    Decompression lumbar laminectomy and micro discectomy, L4-5   Family History  Problem Relation Age of Onset  . Alzheimer's disease Mother    History  Substance Use Topics  . Smoking status: Former Games developer  . Smokeless tobacco: Not on file  . Alcohol Use: No    Review of Systems  Constitutional: Positive for diaphoresis.  Respiratory: Positive for shortness of breath.   Cardiovascular: Positive for chest pain.  Gastrointestinal: Positive for nausea.  All other systems reviewed and are negative.      Allergies  Aspirin; Levitra; and Lipitor  Home Medications   Current Outpatient Rx  Name  Route  Sig  Dispense  Refill  . ALPRAZolam (XANAX) 1 MG tablet   Oral   Take 1 tablet (1 mg total) by mouth at bedtime.   14 tablet   0   . atorvastatin (LIPITOR) 80 MG tablet   Oral   Take 1 tablet (80 mg total) by mouth daily at 6 PM.   30 tablet   5   . buPROPion (WELLBUTRIN XL) 300 MG 24 hr tablet   Oral   Take 300 mg by mouth daily.         Marland Kitchen ezetimibe (ZETIA) 10 MG tablet  Oral   Take 10 mg by mouth at bedtime.         . fluticasone (FLONASE) 50 MCG/ACT nasal spray   Nasal   Place 2 sprays into the nose daily as needed for allergies.          . furosemide (LASIX) 20 MG tablet   Oral   Take 1 tablet (20 mg total) by mouth daily.   30 tablet   5   . lisinopril (PRINIVIL,ZESTRIL) 2.5 MG tablet   Oral   Take 2 tablets (5 mg total) by mouth daily.   60 tablet   5   . metoprolol succinate (TOPROL-XL) 25 MG 24 hr tablet   Oral   Take 2 tablets (50 mg total) by mouth daily.   60 tablet   5   . nitroGLYCERIN (NITROSTAT) 0.4 MG SL tablet   Sublingual   Place 1 tablet (0.4 mg total) under the tongue every 5 (five) minutes x 3 doses as needed for chest pain.   25 tablet    2   . potassium chloride (K-DUR,KLOR-CON) 10 MEQ tablet   Oral   Take 10 mEq by mouth daily. Pt. States has not taken this med in a year because of GI  Problems         . prasugrel (EFFIENT) 10 MG TABS tablet   Oral   Take 1 tablet (10 mg total) by mouth daily.   30 tablet   11   . ranitidine (ZANTAC) 300 MG tablet   Oral   Take 300 mg by mouth daily.          . sertraline (ZOLOFT) 50 MG tablet   Oral   Take 50 mg by mouth daily.           BP 118/82  Pulse 57  Temp(Src) 98 F (36.7 C) (Oral)  Resp 18  Wt 225 lb (102.059 kg)  SpO2 98% Physical Exam  Nursing note and vitals reviewed. Constitutional: He is oriented to person, place, and time. He appears well-developed and well-nourished. No distress.  HENT:  Head: Normocephalic and atraumatic.  Mouth/Throat: Oropharynx is clear and moist.  Eyes: Conjunctivae and EOM are normal. Pupils are equal, round, and reactive to light.  Neck: Normal range of motion. Neck supple. No JVD present.  Cardiovascular: Regular rhythm, normal heart sounds and intact distal pulses.  Bradycardia present.   No extremity edema.  Pulmonary/Chest: Effort normal and breath sounds normal. He exhibits no tenderness.  Abdominal: Soft. Bowel sounds are normal. There is no tenderness.  Musculoskeletal: Normal range of motion. He exhibits no edema.  Neurological: He is alert and oriented to person, place, and time.  Moves limbs without ataxia. Speech fluent, goal oriented. Equal grip strength bilateral.  Skin: Skin is warm. He is diaphoretic.  Psychiatric: He has a normal mood and affect. His behavior is normal.    ED Course  Procedures (including critical care time) Labs Review Labs Reviewed  CBC - Abnormal; Notable for the following:    WBC 16.6 (*)    All other components within normal limits  BASIC METABOLIC PANEL - Abnormal; Notable for the following:    Glucose, Bld 102 (*)    GFR calc non Af Amer 74 (*)    GFR calc Af Amer 86 (*)     All other components within normal limits  I-STAT TROPOININ, ED   Imaging Review Dg Chest Portable 1 View  04/21/2013   CLINICAL DATA:  Chest pain.  EXAM: PORTABLE CHEST - 1 VIEW  COMPARISON:  DG CHEST 1V PORT dated 12/29/2012  FINDINGS: The lungs are adequately inflated. Increased density in the left suprahilar region deep to the cardiac defibrillation pads is noted. There is no pleural effusion. The cardiac silhouette is enlarged. The pulmonary vascularity is not engorged. The mediastinum is normal in width.  IMPRESSION: There is no evidence of pulmonary edema or definite evidence of pneumonia. Increased lung markings in the left suprahilar region are not clearly new. When the patient can tolerate the procedure, a PA and lateral chest x-ray would be useful.   Electronically Signed   By: David  [place]   On: 04/21/2013 14:38    EKG Interpretation   None       MDM   Final diagnoses:  Unstable angina   Patient presenting with chest pain, similar to his MI in November. Exam, patient became diaphoretic, hypotensive and bradycardic. Fluid bolus given and pacer pads placed. Dr. Silverio Lay aware and evaluated patient. Labs, chest x-ray pending. EKG unchanged. I spoke with cardiology who will evaluate patient. 2:54 PM Pt evaluated by Dr. Antoine Poche, cardiology who will admit pt. Labs showing leukocytosis of 16.6, negative troponin. Pt stable at this time. Case discussed with attending Dr. Silverio Lay who also evaluated patient and agrees with plan of care.   Trevor Mace, PA-C 04/21/13 1455

## 2013-04-21 NOTE — Progress Notes (Signed)
ANTICOAGULATION CONSULT NOTE - Initial Consult  Pharmacy Consult for Heparin Indication: chest pain/ACS  Allergies  Allergen Reactions  . Levitra [Vardenafil] Other (See Comments)    Blurred vision  . Aspirin Hives  . Clopidogrel Other (See Comments)    Not effective anti platelet med for patient-switched to effient  . Lipitor [Atorvastatin] Other (See Comments)    Muscle cramps    Patient Measurements: Weight: 225 lb (102.059 kg) Heparin Dosing weigh:   82 kg IBW 70 kg  Vital Signs: Temp: 98 F (36.7 C) (02/24 1322) Temp src: Oral (02/24 1322) BP: 123/63 mmHg (02/24 1600) Pulse Rate: 57 (02/24 1600)  Labs:  Recent Labs  04/21/13 1407  HGB 13.9  HCT 40.4  PLT 244  CREATININE 1.06   Medications:  Infusions:  . nitroGLYCERIN 5 mcg/min (04/21/13 1609)   Assessment: 62 yom admitted for cardiology evaluation of ongoing chest pain.  He has a past medical history significant for CAD and has had STEMI with PCI and stent.  We have been asked to start some IV Heparin while workup is ongoing.  He has a normal CBC with platelets of 244K.  His renal function is normal as well.  No history noted of bleeding disorders.  Goal of Therapy:  Heparin level 0.3-0.7 units/ml Monitor platelets by anticoagulation protocol: Yes   Plan:  1.  Heparin 4000 units IV bolus x 1 2.  Heparin infusion at 1500 units/hr 3.  F/u 6 hour level and adjust rate accordingly  Nadara Mustard, PharmD., MS Clinical Pharmacist Pager:  [phone] Thank you for allowing pharmacy to be part of this patients care team. 04/21/2013,4:15 PM

## 2013-04-21 NOTE — H&P (Signed)
Patient ID: Brendan Butler MRN: [id number], DOB/AGE: [birth year]   Admit date: 04/21/2013   Primary Physician: Aida Puffer, MD Primary Cardiologist: Dr Allyson Sabal  HPI: Brendan Butler is a 62 y.o. male who presents for evaluation of chest pain. He has hx of CAD s/p PCI to LAD in 2001. He had repeat cath in 2007 that revealed patent coronaries and patent stent. He was admitted 12/29/12 with an anterior STEMI. He underwent placement of an LAD DES. His EF was 35-40%. He was discharge 01/01/13 and re admitted 01/05/13 with another STEMI and was taken back to the lab. The procedure was performed by Dr. Excell Seltzer. On the Cath Lab table he developed ventricular fibrillation 3 times, each time with rapid defibrillation back to sinus rhythm.  He was found to have acute in-stent thrombosis of the recently placed LAD stent. The LAD was totally occluded in the proximal vessel at the proximal edge of the stented segment. The lesion was predilated with a 2.5 mm balloon followed by a 3.0 mm noncompliant balloon. Intravascular ultrasound was then performed in order to define the mechanism of stent thrombosis. The previously implanted stent appeared well expanded throughout. There was an area of mal apposition in the distal stent. Dr. Excell Seltzer elected to dilate the entire stented segment again with a 3.5 mm noncompliant balloon. Following PCI, there was 0% residual stenosis and TIMI-3 flow. Final angiography confirmed an excellent result. His EF then was 40-45%. He was changed from Brilinta to Effient. At time of discharge, the patient expressed difficulty managing his medications at home. While reviewing his discharge instructions and medication orders, it was discovered that the patient has a low literacy level. Due to the fact that he lives home alone, there was concern that his low literacy level may interfere with medication compliance. Case Management was consulted to arrange for a home health nurse to visit with the  patient weekly, to help organize his medicines correctly. [facility] was also consulted. He saw Dr Allyson Sabal in follow up 02/24/13 and was doing well.              Today he says he noted Rt sided chest pain around 11 am. Over two hours this progressed to mid sternal chest pain. He says NTG given by EMS helped his symptoms. In the ER he had an episode of bradycardia and hypotension after his IV was started. He says not the chest pain is "5-6/10". He denies any nausea. He says the pain is worse with deep inspiration. He did have a widely patent RCA and CFX at cath in November.       Problem List: Past Medical History  Diagnosis Date  . Hypertension   . Coronary artery disease 2001    stent to LAD and patent 2007 on cath  . Acute myocardial infarction of other anterior wall, initial episode of care 12/29/12    Promus stent to LAD  . Acute ST segment elevation MI 01/05/13    secondary to thrombus in stent; Brilintta changed to Effient pt with ASA allergy  . OSA (obstructive sleep apnea)   . Hyperlipidemia LDL goal <70   . Erectile dysfunction   . Echocardiogram abnormal 01/06/13    EF 45-50%, grade I diastolic dysfunction    Past Surgical History  Procedure Laterality Date  . Coronary angioplasty with stent placement  2001    to LAD  . Cardiac catheterization  2007    patent stent to LAD and normal Cors  .  Coronary angioplasty with stent placement  12/29/12    STEMI with promus DES to LAD  . Coronary angioplasty with stent placement  01/05/13    STEMI with thrombosis in stent to LAD  . Back surgery  2007    Decompression lumbar laminectomy and micro discectomy, L4-5     Allergies:  Allergies  Allergen Reactions  . Levitra [Vardenafil] Other (See Comments)    Blurred vision  . Aspirin Hives  . Clopidogrel Other (See Comments)    Not effective anti platelet med for patient-switched to effient  . Lipitor [Atorvastatin] Other (See Comments)    Muscle cramps     Home  Medications No current facility-administered medications for this encounter.   Current Outpatient Prescriptions  Medication Sig Dispense Refill  . ALPRAZolam (XANAX) 1 MG tablet Take 1 mg by mouth 2 (two) times daily. Takes in evening and at bedtime      . atorvastatin (LIPITOR) 80 MG tablet Take 1 tablet (80 mg total) by mouth daily at 6 PM.  30 tablet  5  . ezetimibe (ZETIA) 10 MG tablet Take 10 mg by mouth at bedtime.      . fluticasone (FLONASE) 50 MCG/ACT nasal spray Place 2 sprays into the nose daily as needed for allergies.       . furosemide (LASIX) 20 MG tablet Take 1 tablet (20 mg total) by mouth daily.  30 tablet  5  . lisinopril (PRINIVIL,ZESTRIL) 2.5 MG tablet Take 2 tablets (5 mg total) by mouth daily.  60 tablet  5  . metoprolol succinate (TOPROL-XL) 25 MG 24 hr tablet Take 2 tablets (50 mg total) by mouth daily.  60 tablet  5  . Multiple Vitamin (MULTIVITAMIN WITH MINERALS) TABS tablet Take 1 tablet by mouth daily.      . potassium chloride (K-DUR,KLOR-CON) 10 MEQ tablet Take 10 mEq by mouth daily. Pt. States has not taken this med in a year because of GI  Problems      . prasugrel (EFFIENT) 10 MG TABS tablet Take 1 tablet (10 mg total) by mouth daily.  30 tablet  11  . ranitidine (ZANTAC) 300 MG tablet Take 300 mg by mouth daily.       . sertraline (ZOLOFT) 50 MG tablet Take 50 mg by mouth daily.       . traMADol (ULTRAM) 50 MG tablet Take 50 mg by mouth every 6 (six) hours as needed for severe pain.      . nitroGLYCERIN (NITROSTAT) 0.4 MG SL tablet Place 1 tablet (0.4 mg total) under the tongue every 5 (five) minutes x 3 doses as needed for chest pain.  25 tablet  2     Family History  Problem Relation Age of Onset  . Alzheimer's disease Mother      History   Social History  . Marital Status: Divorced    Spouse Name: N/A    Number of Children: N/A  . Years of Education: N/A   Occupational History  . Not on file.   Social History Main Topics  . Smoking status:  Former Games developer  . Smokeless tobacco: Not on file  . Alcohol Use: No  . Drug Use: No  . Sexual Activity: Not on file   Other Topics Concern  . Not on file   Social History Narrative  . No narrative on file     Review of Systems: General: negative for chills, fever, night sweats or weight changes.  Cardiovascular: negative for dyspnea on exertion,  edema, orthopnea, palpitations, paroxysmal nocturnal dyspnea or shortness of breath Dermatological: negative for rash Respiratory: negative for cough or wheezing Urologic: negative for hematuria Abdominal: negative for nausea, vomiting, diarrhea, bright red blood per rectum, melena, or hematemesis Neurologic: negative for visual changes, syncope, or dizziness All other systems reviewed and are otherwise negative except as noted above.  Physical Exam: Blood pressure 118/82, pulse 57, temperature 98 F (36.7 C), temperature source Oral, resp. rate 18, weight 225 lb (102.059 kg), SpO2 98.00%.  General appearance: alert, cooperative, no distress and pale Neck: no carotid bruit and no JVD Lungs: clear to auscultation bilaterally Heart: regular rate and rhythm Abdomen: midline surgical scar Extremities: extremities normal, atraumatic, no cyanosis or edema Pulses: 2+ and symmetric Skin: Skin color, texture, turgor normal. No rashes or lesions Neurologic: Grossly normal    Labs:   Results for orders placed during the hospital encounter of 04/21/13 (from the past 24 hour(s))  CBC     Status: Abnormal   Collection Time    04/21/13  2:07 PM      Result Value Ref Range   WBC 16.6 (*) 4.0 - 10.5 K/uL   RBC 4.63  4.22 - 5.81 MIL/uL   Hemoglobin 13.9  13.0 - 17.0 g/dL   HCT 16.140.4  09.639.0 - 04.552.0 %   MCV 87.3  78.0 - 100.0 fL   MCH 30.0  26.0 - 34.0 pg   MCHC 34.4  30.0 - 36.0 g/dL   RDW 40.912.4  81.111.5 - 91.415.5 %   Platelets 244  150 - 400 K/uL  BASIC METABOLIC PANEL     Status: Abnormal   Collection Time    04/21/13  2:07 PM      Result Value  Ref Range   Sodium 138  137 - 147 mEq/L   Potassium 4.3  3.7 - 5.3 mEq/L   Chloride 100  96 - 112 mEq/L   CO2 27  19 - 32 mEq/L   Glucose, Bld 102 (*) 70 - 99 mg/dL   BUN 15  6 - 23 mg/dL   Creatinine, Ser 7.821.06  0.50 - 1.35 mg/dL   Calcium 9.1  8.4 - 95.610.5 mg/dL   GFR calc non Af Amer 74 (*) >90 mL/min   GFR calc Af Amer 86 (*) >90 mL/min  I-STAT TROPOININ, ED     Status: None   Collection Time    04/21/13  2:15 PM      Result Value Ref Range   Troponin i, poc 0.01  0.00 - 0.08 ng/mL   Comment 3              Radiology/Studies: Dg Chest Portable 1 View  04/21/2013   CLINICAL DATA:  Chest pain.  EXAM: PORTABLE CHEST - 1 VIEW  COMPARISON:  DG CHEST 1V PORT dated 12/29/2012  FINDINGS: The lungs are adequately inflated. Increased density in the left suprahilar region deep to the cardiac defibrillation pads is noted. There is no pleural effusion. The cardiac silhouette is enlarged. The pulmonary vascularity is not engorged. The mediastinum is normal in width.  IMPRESSION: There is no evidence of pulmonary edema or definite evidence of pneumonia. Increased lung markings in the left suprahilar region are not clearly new. When the patient can tolerate the procedure, a PA and lateral chest x-ray would be useful.   Electronically Signed   By: David  [place]   On: 04/21/2013 14:38    EKG:NSR, SB with old TWI AVL, and old ant Qs  ASSESSMENT  AND PLAN:  Principal Problem:   Unstable angina Active Problems:   STEMI 12/29/12 Rx'd with LAD DES with early stent thrombosis, STEMI-PCI 01/05/14   Aspirin allergy- hives   Leukocytosis   HYPERLIPIDEMIA   HYPERTENSION   SLEEP APNEA   Illiteracy and low-level literacy   PLAN: Will review with MD-? Cath. Transient hypotension and bradycardia sounds like it was vaso-vagal after IV placed. He is still a little bradycardic, will decrease his Toprol to 25 mg and hold for HR less than 55.   He needs PA and Lat CXR, will get this today.   It is noted that he  has had an elevated WBC going back to November- diff then not particularly remarkable, will repeat.  He is not febrile now. Consider Heme w/u as an OP.    Signed, Abelino Derrick, PA-C 04/21/2013, 3:21 PM  History and all data above reviewed.  Patient examined.  I agree with the findings as above.  The patient has had the sudden onset of chest pain.  It is atypical but similar to his previous angina.  It started this AM suddenly.  There are no acute ECG changes.  However, he has a dramatic recent history as above.  The patient exam reveals COR:RRR  ,  Lungs: Clear  ,  Abd: Positive bowel sounds, no rebound no guarding, Ext No edema  .  All available labs, radiology testing, previous records reviewed. Agree with documented assessment and plan. Chest pain.  We will admit him and cycle enzymes.  We will start IV heparin.  He will need cath in the AM.    Rollene Rotunda  5:04 PM  04/21/2013

## 2013-04-21 NOTE — ED Notes (Signed)
Patient transported to X-ray 

## 2013-04-22 ENCOUNTER — Encounter (HOSPITAL_COMMUNITY)
Admission: EM | Disposition: A | Discharge status: Home / Self Care | Payer: Medicare Other | Source: Home / Self Care | Attending: Cardiology

## 2013-04-22 DIAGNOSIS — I219 Acute myocardial infarction, unspecified: Secondary | ICD-10-CM

## 2013-04-22 DIAGNOSIS — I10 Essential (primary) hypertension: Secondary | ICD-10-CM

## 2013-04-22 DIAGNOSIS — I2510 Atherosclerotic heart disease of native coronary artery without angina pectoris: Secondary | ICD-10-CM

## 2013-04-22 HISTORY — PX: LEFT HEART CATHETERIZATION WITH CORONARY ANGIOGRAM: SHX5451

## 2013-04-22 LAB — TROPONIN I: Troponin I: <0.3 ng/mL (ref <0.30)

## 2013-04-22 LAB — CBC
HCT: 36.8 % — ABNORMAL LOW (ref 39.0–52.0)
Hemoglobin: 12.4 g/dL — ABNORMAL LOW (ref 13.0–17.0)
MCH: 29.9 pg (ref 26.0–34.0)
MCHC: 33.7 g/dL (ref 30.0–36.0)
MCV: 88.7 fL (ref 78.0–100.0)
Platelets: 205 10*3/uL (ref 150–400)
RBC: 4.15 MIL/uL — ABNORMAL LOW (ref 4.22–5.81)
RDW: 12.7 % (ref 11.5–15.5)
WBC: 16.2 10*3/uL — ABNORMAL HIGH (ref 4.0–10.5)

## 2013-04-22 LAB — BASIC METABOLIC PANEL
BUN: 13 mg/dL (ref 6–23)
CO2: 24 mEq/L (ref 19–32)
Calcium: 8.5 mg/dL (ref 8.4–10.5)
Chloride: 104 mEq/L (ref 96–112)
Creatinine, Ser: 1.09 mg/dL (ref 0.50–1.35)
GFR calc Af Amer: 83 mL/min — ABNORMAL LOW (ref >90)
GFR calc non Af Amer: 72 mL/min — ABNORMAL LOW (ref >90)
Glucose, Bld: 114 mg/dL — ABNORMAL HIGH (ref 70–99)
Potassium: 4.6 mEq/L (ref 3.7–5.3)
Sodium: 140 mEq/L (ref 137–147)

## 2013-04-22 LAB — HEPARIN LEVEL (UNFRACTIONATED): HEPARIN UNFRACTIONATED: 0.27 [IU]/mL — AB (ref 0.30–0.70)

## 2013-04-22 LAB — LIPID PANEL
Cholesterol: 130 mg/dL (ref 0–200)
HDL: 47 mg/dL (ref >39)
LDL Cholesterol: 61 mg/dL (ref 0–99)
Total CHOL/HDL Ratio: 2.8 RATIO
Triglycerides: 111 mg/dL (ref <150)
VLDL: 22 mg/dL (ref 0–40)

## 2013-04-22 LAB — POCT ACTIVATED CLOTTING TIME: Activated Clotting Time: 470 seconds

## 2013-04-22 SURGERY — LEFT HEART CATHETERIZATION WITH CORONARY ANGIOGRAM
Anesthesia: LOCAL

## 2013-04-22 MED ORDER — MORPHINE SULFATE 2 MG/ML IJ SOLN
2.0000 mg | INTRAMUSCULAR | Status: DC | PRN
  Administered 2013-04-22 (×3): 2 mg via INTRAVENOUS
  Filled 2013-04-22 (×3): qty 1

## 2013-04-22 MED ORDER — MORPHINE SULFATE 2 MG/ML IJ SOLN
2.0000 mg | Freq: Once | INTRAMUSCULAR | Status: AC
Start: 2013-04-22 — End: 2013-04-22
  Administered 2013-04-22: 2 mg via INTRAVENOUS
  Filled 2013-04-22: qty 1

## 2013-04-22 MED ORDER — BIVALIRUDIN 250 MG IV SOLR
INTRAVENOUS | Status: AC
  Filled 2013-04-22: qty 250

## 2013-04-22 MED ORDER — PRASUGREL HCL 10 MG PO TABS
ORAL_TABLET | ORAL | Status: AC
  Filled 2013-04-22: qty 6

## 2013-04-22 MED ORDER — NITROGLYCERIN 0.2 MG/ML ON CALL CATH LAB
INTRAVENOUS | Status: AC
  Filled 2013-04-22: qty 1

## 2013-04-22 MED ORDER — NITROGLYCERIN IN D5W 200-5 MCG/ML-% IV SOLN
2.0000 ug/min | INTRAVENOUS | Status: DC
  Administered 2013-04-22: 60 ug/min via INTRAVENOUS
  Administered 2013-04-22: 30 ug/min via INTRAVENOUS
  Filled 2013-04-22: qty 250

## 2013-04-22 MED ORDER — ALBUTEROL SULFATE (2.5 MG/3ML) 0.083% IN NEBU: 2.5000 mg | INHALATION_SOLUTION | Freq: Four times a day (QID) | RESPIRATORY_TRACT | Status: DC | PRN

## 2013-04-22 MED ORDER — MIDAZOLAM HCL 2 MG/2ML IJ SOLN
INTRAMUSCULAR | Status: AC
  Filled 2013-04-22: qty 2

## 2013-04-22 MED ORDER — LIDOCAINE HCL (PF) 1 % IJ SOLN
INTRAMUSCULAR | Status: AC
  Filled 2013-04-22: qty 30

## 2013-04-22 MED ORDER — PRASUGREL HCL 10 MG PO TABS
10.0000 mg | ORAL_TABLET | Freq: Every day | ORAL | Status: DC
  Administered 2013-04-22 – 2013-04-23 (×2): 10 mg via ORAL
  Filled 2013-04-22: qty 1

## 2013-04-22 MED ORDER — HEPARIN (PORCINE) IN NACL 2-0.9 UNIT/ML-% IJ SOLN
INTRAMUSCULAR | Status: AC
  Filled 2013-04-22: qty 1000

## 2013-04-22 MED ORDER — FENTANYL CITRATE 0.05 MG/ML IJ SOLN
INTRAMUSCULAR | Status: AC
  Filled 2013-04-22: qty 2

## 2013-04-22 MED ORDER — SODIUM CHLORIDE 0.9 % IV SOLN
INTRAVENOUS | Status: DC
  Administered 2013-04-22: 11:00:00 via INTRAVENOUS

## 2013-04-22 NOTE — Interval H&P Note (Signed)
Cath Lab Visit (complete for each Cath Lab visit)  Clinical Evaluation Leading to the Procedure:   ACS: no  Non-ACS:    Anginal Classification: CCS III  Anti-ischemic medical therapy: Maximal Therapy (2 or more classes of medications)  Non-Invasive Test Results: No non-invasive testing performed  Prior CABG: No previous CABG      History and Physical Interval Note:  04/22/2013 8:39 AM  Brendan Butler  has presented today for surgery, with the diagnosis of chest pain  The various methods of treatment have been discussed with the patient and family. After consideration of risks, benefits and other options for treatment, the patient has consented to  Procedure(s): LEFT HEART CATHETERIZATION WITH CORONARY ANGIOGRAM (N/Butler) as Butler surgical intervention .  The patient's history has been reviewed, patient examined, no change in status, stable for surgery.  I have reviewed the patient's chart and labs.  Questions were answered to the patient's satisfaction.     Brendan Butler

## 2013-04-22 NOTE — Progress Notes (Signed)
ANTICOAGULATION CONSULT NOTE  Pharmacy Consult for Heparin Indication: chest pain/ACS  Allergies  Allergen Reactions  . Levitra [Vardenafil] Other (See Comments)    Blurred vision  . Aspirin Hives  . Clopidogrel Other (See Comments)    Not effective anti platelet med for patient-switched to effient    Patient Measurements: Height: 5\' 10"  (177.8 cm) Weight: 214 lb 4.6 oz (97.2 kg) IBW/kg (Calculated) : 73 Heparin Dosing weigh:   82 kg IBW 70 kg  Vital Signs: Temp: 99.5 F (37.5 C) (02/25 0000) Temp src: Oral (02/25 0000) BP: 128/59 mmHg (02/25 0000) Pulse Rate: 64 (02/25 0000)  Labs:  Recent Labs  04/21/13 1407 04/21/13 1605 04/21/13 2200  HGB 13.9 14.3  --   HCT 40.4 41.3  --   PLT 244 251  --   APTT  --  28  --   LABPROT  --  12.5  --   INR  --  0.95  --   HEPARINUNFRC  --   --  0.45  CREATININE 1.06  --   --   TROPONINI  --  <0.30 <0.30   Medications:  Infusions:  . sodium chloride 1 mL/kg/hr (04/21/13 1934)  . heparin 1,500 Units/hr (04/21/13 2000)  . nitroGLYCERIN 25 mcg/min (04/21/13 2052)   Assessment: 63 y.o. male with chest pain for heparin   Goal of Therapy:  Heparin level 0.3-0.7 units/ml Monitor platelets by anticoagulation protocol: Yes   Plan:  No change for now Follow-up am labs.  Geannie Risen, PharmD, BCPS   04/22/2013,12:27 AM

## 2013-04-22 NOTE — Progress Notes (Signed)
Order for sheath removal verified per post procedural orders. Procedure explained to patient and Rt femoral artery access site assessed: level 0, palpable dorsalis pedis and posterior tibial pulses. 6 French Sheath removed and manual pressure applied for 20 minutes. Pre, peri, & post procedural vitals: HR 70, RR 14, O2 Sat upper140, BP 72, Pain 0. Distal pulses remained intact after sheath removal. Access site level 0 and dressed with 4X4 gauze and tegaderm. RN confirmed condition of site. Post procedural instructions discussed with return demonstration from patient.

## 2013-04-22 NOTE — CV Procedure (Signed)
Brendan Butler is a 63 y.o. male    [id number]  [id number] LOCATION:  FACILITY: [facility]  PHYSICIAN: Lennette Biharihomas A. Kelly, MD, [facility] [birth year]   DATE OF PROCEDURE:  04/22/2013    CARDIAC CATHETERIZATION     HISTORY:  Brendan Butler is a 63 year old white male who has known coronary artery disease and underwent initial DCI to his LAD in 2001. In November 2014 he presented with an anterior ST segment elevation MI and underwent insertion of LAD DES stents. He was discharged on 01/11/2013 was readmitted his later with another study and was taken acutely back to the Cath Lab and on the table developed VF requiring defibrillation. He was found to have acute thrombosis of his stent at the proximal edge the entire stented region was redilated with up to a 3.5 noncompliant balloon. He had done well. Ejection fraction had increased to 40-45%. He was changed from brilinta to effient and had done well until yesterday when he developed recurrent episodes of midsternal chest pain. He was admitted to the hospital, treated for unstable angina and now presents for repeat cardiac catheterization.    PROCEDURE:  The patient was brought to the second floor [place] [facility]iac cath lab in the postabsorptive state. He was premedicated with Versed 2 mg and fentanyl 50 mcg. His right was prepped and shaved in usual sterile fashion. Xylocaine 1% was used for local anesthesia. A 5 French sheath was inserted into the right femoral artery. Diagnostic catheterizatiion was done with 5French LF4, FR4, and pigtail catheters. Left ventriculography was done with 25 cc Omnipaque contrast. With the demonstration of focal 70% in-stent restenosis in the region just beyond the diagonal vessel and the mid LAD the decision was made to attempt intervention with cutting balloon. The sheath was upgraded to a 6 [place]French sheath. Angiomax bolus plus infusion was administered. Effient 10 mg was administered in this patient who was on this  chronically. A 6 French 3.5 XB LAD guide was used for the intervention and a prowater wire was advanced down the LAD. A Flextome Cutting Balloon atherotomy catheter was then inserted 3.5x10 mm and 5 dilatations were made up to 3.62 mm. With each inflation, the patient experiences identical chest pain that he had been experiencing. [facility] angiography confirmed an excellent angiographic result with a 70% stenosis being reduced to 0%. There was brisk TIMI-3 flow. There was no evidence for dissection. The arterial sheath was sutured in place with plans for sheath removal 2 hours after the Angiomax discontinuation. He left the catheterization laboratory chest pain-free with stable hemodynamics.   HEMODYNAMICS:   Central Aorta: 100/53   Left Ventricle: 110/14  ANGIOGRAPHY:  1. Left main: Angiographically normal vessel trifurcating into the LAD, a ramus intermediate vessel, and a left circumflex coronary artery. 2. LAD: Long area of stented segment commencing proximally just beyond the septal perforating artery and extending into the mid vessel. A large diagonal but vessel was jailed by this and there was 30-40% ostial narrowing. Just beyond the diagonal vessel there was focal 70% in-stent restenosis somewhat eccentric 3. Ramus Intermediate: Angiographically normal 4. Left circumflex: Angiographically normal.  5. Right coronary artery: Large dominant angiographically normal vessel   Following successful percutaneous coronary intervention to the focal in-stent restenosis in the mid LAD stent treated with  A 3.5 x 10 mm Flextome Cutting Balloon atherotomy catheter with 5 dilatations up to 3.62 mm, the 70% stenosis was reduced to 0%. There was brisk TIMI-3 flow. The patient experiences identical chest pain  with each balloon inflation.  Left ventriculography revealed normal LV function without focal segmental wall motion abnormalities.  IMPRESSION:  Normal LV function  Focal 70% in-stent restenosis in  the mid LAD stent immediately beyond the diagonal takeoff with a 30% narrowing at the ostium of this first diagonal vessel.  Successful Flextome Cutting Balloon atherotomy dilated to 3.62 mm with the 70% stenosis being reduced to 0%  Angiomax/Effient/IC nitroglycerin  Lennette Bihari, MD, [facility] 04/22/2013 3:01 PM

## 2013-04-22 NOTE — Progress Notes (Signed)
Utilization Review Completed.Nakyra Bourn T2/25/2015  

## 2013-04-22 NOTE — Progress Notes (Signed)
Patient Name: Brendan Butler Date of Encounter: 04/22/2013  Principal Problem:   Unstable angina Active Problems:   HYPERLIPIDEMIA   HYPERTENSION   SLEEP APNEA   STEMI 12/29/12 Rx'd with LAD DES with early stent thrombosis, STEMI-PCI 01/05/14   Aspirin allergy- hives   Illiteracy and low-level literacy   Leukocytosis   Length of Stay: 1  SUBJECTIVE  The patient just arrived from cath lab and has residual chest pain.   CURRENT MEDS . atorvastatin  80 mg Oral QHS  . ezetimibe  10 mg Oral QHS  . famotidine  20 mg Oral Daily  . furosemide  20 mg Oral Daily  . lisinopril  5 mg Oral Daily  . metoprolol succinate  25 mg Oral Daily  . multivitamin with minerals  1 tablet Oral Daily  . potassium chloride  10 mEq Oral Daily  . prasugrel  10 mg Oral Daily  . sertraline  50 mg Oral Daily   OBJECTIVE  Filed Vitals:   04/22/13 0700 04/22/13 0730 04/22/13 0800 04/22/13 1100  BP:  118/58 132/60 120/44  Pulse: 63 64 62 58  Temp:  98.7 F (37.1 C)    TempSrc:  Oral    Resp: 23 21 20 18   Height:      Weight:      SpO2: 95% 94% 97% 92%    Intake/Output Summary (Last 24 hours) at 04/22/13 1146 Last data filed at 04/22/13 1044  Gross per 24 hour  Intake 2079.94 ml  Output   1300 ml  Net 779.94 ml   Filed Weights   04/21/13 1322 04/21/13 1933  Weight: 225 lb (102.059 kg) 214 lb 4.6 oz (97.2 kg)   PHYSICAL EXAM  General: Pleasant, NAD. Neuro: Alert and oriented X 3. Moves all extremities spontaneously. Psych: Normal affect. HEENT:  Normal  Neck: Supple without bruits or JVD. Lungs:  Resp regular and unlabored, CTA. Heart: RRR no s3, s4, or murmurs. Abdomen: Soft, non-tender, non-distended, BS + x 4.  Extremities: No clubbing, cyanosis or edema. DP/PT/Radials 2+ and equal bilaterally.  Accessory Clinical Findings  CBC  Recent Labs  04/21/13 1605 04/22/13 0321  WBC 19.5* 16.2*  NEUTROABS 15.7*  --   HGB 14.3 12.4*  HCT 41.3 36.8*  MCV 87.5 88.7  PLT 251  205   Basic Metabolic Panel  Recent Labs  04/21/13 1407 04/22/13 0321  NA 138 140  K 4.3 4.6  CL 100 104  CO2 27 24  GLUCOSE 102* 114*  BUN 15 13  CREATININE 1.06 1.09  CALCIUM 9.1 8.5   Cardiac Enzymes  Recent Labs  04/21/13 1605 04/21/13 2200 04/22/13 0321  TROPONINI <0.30 <0.30 <0.30    Recent Labs  04/22/13 0321  CHOL 130  HDL 47  LDLCALC 61  TRIG 111  CHOLHDL 2.8   Radiology/Studies  Dg Chest Portable 1 View  04/21/2013  IMPRESSION: There is no evidence of pulmonary edema or definite evidence of pneumonia. Increased lung markings in the left suprahilar region are not clearly new. When the patient can tolerate the procedure, a PA and lateral chest x-ray would be useful.   Electronically Signed   By: David  [place]   On: 04/21/2013 14:38   TELE: SR  ECG: SR, normal ECG    ASSESSMENT AND PLAN  Principal Problem:  Unstable angina  Active Problems:  STEMI 12/29/12 Rx'd with LAD DES with early stent thrombosis, STEMI-PCI 01/05/14  Aspirin allergy- hives  Leukocytosis  HYPERLIPIDEMIA  HYPERTENSION  SLEEP  APNEA  Illiteracy and low-level literacy  63 year old male with h/o CAD, s/p anterior STEMI in Nov 2014 who presented with chest pain yesterday, his cath today showed focal normal LV function and focal 70% in-stent restenosis in the mid LAD stent immediately beyond the diagonal takeoff with a 30% narrowing at the ostium of this first diagonal vessel. A successful Flextome Cutting Balloon atherotomy dilated to 3.62 mm with the 70% stenosis being reduced to 0% . The patient complained of chest pain after arrival from the cath lab. IV nitroglycerin and iv morphine relived the pain. There were no new ECG changes. We will continue Effient/IC nitroglycerin.  Signed, Tobias AlexanderNELSON, Angelic Schnelle, H MD, [facility] 04/22/2013

## 2013-04-22 NOTE — ED Provider Notes (Signed)
Medical screening examination/treatment/procedure(s) were conducted as a shared visit with non-physician practitioner(s) and myself.  I personally evaluated the patient during the encounter.   Date: 04/21/2013  Rate: 56  Rhythm: normal sinus rhythm  QRS Axis: normal  Intervals: normal  ST/T Wave abnormalities: nonspecific ST changes  Conduction Disutrbances:none  Narrative Interpretation:   Old EKG Reviewed: unchanged  Brendan Butler is a 63 y.o. male hx of MI, CAD here with acute onset of chest pain this AM. Took nitro that helped his pain. I was called into the room because he became hypotensive in the ED and bradycardic to 40s. After IVF, his heart rate increased and blood pressure became normal. EKG unchanged from previous. Cardiology evaluated patient and will admit for unstable angina.      Richardean Canal, MD 04/22/13 [id number]

## 2013-04-23 DIAGNOSIS — E785 Hyperlipidemia, unspecified: Secondary | ICD-10-CM

## 2013-04-23 LAB — BASIC METABOLIC PANEL
BUN: 10 mg/dL (ref 6–23)
CHLORIDE: 104 meq/L (ref 96–112)
CO2: 26 meq/L (ref 19–32)
Calcium: 8.7 mg/dL (ref 8.4–10.5)
Creatinine, Ser: 1.09 mg/dL (ref 0.50–1.35)
GFR calc Af Amer: 83 mL/min — ABNORMAL LOW (ref >90)
GFR calc non Af Amer: 72 mL/min — ABNORMAL LOW (ref >90)
Glucose, Bld: 99 mg/dL (ref 70–99)
Potassium: 4.1 mEq/L (ref 3.7–5.3)
SODIUM: 141 meq/L (ref 137–147)

## 2013-04-23 LAB — CBC
HCT: 34.4 % — ABNORMAL LOW (ref 39.0–52.0)
Hemoglobin: 11.6 g/dL — ABNORMAL LOW (ref 13.0–17.0)
MCH: 29.7 pg (ref 26.0–34.0)
MCHC: 33.7 g/dL (ref 30.0–36.0)
MCV: 88.2 fL (ref 78.0–100.0)
PLATELETS: 198 10*3/uL (ref 150–400)
RBC: 3.9 MIL/uL — AB (ref 4.22–5.81)
RDW: 12.6 % (ref 11.5–15.5)
WBC: 13 10*3/uL — ABNORMAL HIGH (ref 4.0–10.5)

## 2013-04-23 NOTE — Discharge Summary (Signed)
Tobias Alexander, Rexene Edison 04/23/2013

## 2013-04-23 NOTE — Progress Notes (Signed)
CARDIAC REHAB PHASE I   PRE:  Rate/Rhythm: 63 SR  MODE:  Ambulation: 850 ft   POST:  Rate/Rhythm: 78SR  10:15am-11:00am  When I got to the patients room he was coming out of the bathroom and getting back into the bed and he stated that he was having 7/10 chest pain.  He stated that as he stood there the pain was now a 3/10.  I alerted the nurse and we decided that I would not ambulate with the patient since he was having pain.  As soon as the patient lied down in bed, he stated that he was no longer having any pain.  I educated the patient on cardiac rehab, nutrition, exercise, risk factors, restrictions, and chest pain.  After I was done educating, the nurse wanted me to ambulate with the patient to see if the pain came back.  The patient ambulated at a moderate pace independently and he did not complain of any shortness of breath or chest pain.  The patient was put back into his bed and is waiting to be discharged. The patient is interested in cardiac rehab at [facility].    Theresa Duty, [place] 04/23/2013 10:53 AM

## 2013-04-23 NOTE — Discharge Summary (Signed)
Physician Discharge Summary      Patient ID: Brendan Butler MRN: [id number] DOB/AGE: 10/07/1950 63 y.o.  Admit date: 04/21/2013 Discharge date: 04/23/2013  Admission Diagnoses:  Unstable angina  Discharge Diagnoses:  Principal Problem:   Unstable angina Active Problems:   HYPERLIPIDEMIA   HYPERTENSION   SLEEP APNEA   STEMI 12/29/12 Rx'd with LAD DES with early stent thrombosis, STEMI-PCI 01/05/14   Aspirin allergy- hives   Illiteracy and low-level literacy   Leukocytosis   Discharged Condition: stable  Hospital Course:    Brendan Butler is a 63 y.o. male who presented for evaluation of chest pain. He has hx of CAD s/p PCI to LAD in 2001. He had repeat cath in 2007 that revealed patent coronaries and patent stent. He was admitted 12/29/12 with an anterior STEMI. He underwent placement of an LAD DES. His EF was 35-40%. He was discharge 01/01/13 and re admitted 01/05/13 with another STEMI and was taken back to the lab. The procedure was performed by Dr. Excell Seltzer. On the Cath Lab table he developed ventricular fibrillation 3 times, each time with rapid defibrillation back to sinus rhythm. He was found to have acute in-stent thrombosis of the recently placed LAD stent. The LAD was totally occluded in the proximal vessel at the proximal edge of the stented segment. The lesion was predilated with a 2.5 mm balloon followed by a 3.0 mm noncompliant balloon. Intravascular ultrasound was then performed in order to define the mechanism of stent thrombosis. The previously implanted stent appeared well expanded throughout. There was an area of mal apposition in the distal stent. Dr. Excell Seltzer elected to dilate the entire stented segment again with a 3.5 mm noncompliant balloon. Following PCI, there was 0% residual stenosis and TIMI-3 flow. Final angiography confirmed an excellent result. His EF then was 40-45%. He was changed from Brilinta to Effient. At time of discharge, the patient expressed difficulty  managing his medications at home. While reviewing his discharge instructions and medication orders, it was discovered that the patient has a low literacy level. Due to the fact that he lives home alone, there was concern that his low literacy level may interfere with medication compliance. Case Management was consulted to arrange for a home health nurse to visit with the patient weekly, to help organize his medicines correctly. [facility] was also consulted. He saw Dr Allyson Sabal in follow up 02/24/13 and was doing well.   He had an echocardiogram on 03/12/13 and EF was 45-50%, severe hypokinesis of the distalanteroseptal myocardium.     The day he was admitted he developed Rt sided chest pain around 11 am. Over two hours this progressed to mid sternal chest pain. He says NTG given by EMS helped his symptoms. In the ER he had an episode of bradycardia and hypotension after his IV was started. He says not the chest pain is "5-6/10". He denies any nausea. He says the pain is worse with deep inspiration. He did have a widely patent RCA and CFX at cath in November.     He was started on IV heparin.  Toprol was decreased due to bradycardia and ultimately held.  He ruled-out for MI but due to history of ISRS he was taken tot he cath lab for [facility].  This revealed a focal 70% in-stent restenosis in the mid LAD stent immediately beyond the diagonal takeoff with a 30% narrowing at the ostium of this first diagonal vessel. He underwent successful Flextome Cutting Balloon atherotomy dilated to 3.62  mm with the 70% stenosis being reduced to 0%.  Effient, lisinopril, statin.  He has an ASA allergy.  We will continue to hold beta blocker due to bradycardia which may have initially been a vasovagal episode.  Will reconsider at the followup appt. The patient was seen by Dr. Delton SeeNelson who felt he was stable for DC home.   Office Follow-up:  Restart Toprol XL?      Consults: None  Significant Diagnostic Studies:  Coronary  angiography and PCI  HEMODYNAMICS:  Central Aorta: 100/53  Left Ventricle: 110/14  ANGIOGRAPHY:  1. Left main: Angiographically normal vessel trifurcating into the LAD, a ramus intermediate vessel, and a left circumflex coronary artery.  2. LAD: Long area of stented segment commencing proximally just beyond the septal perforating artery and extending into the mid vessel. A large diagonal but vessel was jailed by this and there was 30-40% ostial narrowing. Just beyond the diagonal vessel there was focal 70% in-stent restenosis somewhat eccentric  3. Ramus Intermediate: Angiographically normal  4. Left circumflex: Angiographically normal.  5. Right coronary artery: Large dominant angiographically normal vessel  Following successful percutaneous coronary intervention to the focal in-stent restenosis in the mid LAD stent treated with A 3.5 x 10 mm Flextome Cutting Balloon atherotomy catheter with 5 dilatations up to 3.62 mm, the 70% stenosis was reduced to 0%. There was brisk TIMI-3 flow. The patient experiences identical chest pain with each balloon inflation.  Left ventriculography revealed normal LV function without focal segmental wall motion abnormalities.  IMPRESSION:  Normal LV function  Focal 70% in-stent restenosis in the mid LAD stent immediately beyond the diagonal takeoff with a 30% narrowing at the ostium of this first diagonal vessel.  Successful Flextome Cutting Balloon atherotomy dilated to 3.62 mm with the 70% stenosis being reduced to 0%  Angiomax/Effient/IC nitroglycerin  Lennette Biharihomas A. Kelly, MD, [facility]  04/22/2013  3:01 PM  Cardiac Panel (last 3 results)  Recent Labs  04/21/13 1605 04/21/13 2200 04/22/13 0321  TROPONINI <0.30 <0.30 <0.30    Treatments: See above  Discharge Exam: Blood pressure 112/48, pulse 63, temperature 98.5 F (36.9 C), temperature source Oral, resp. rate 18, height 5\' 10"  (1.778 m), weight 214 lb 4.6 oz (97.2 kg), SpO2 93.00%.   Disposition:  06-Home-Health Care Svc  Discharge Orders   Future Orders Complete By Expires   Diet - low sodium heart healthy  As directed    Discharge instructions  As directed    Comments:     No lifting more than a half gallon of milk or driving for three days.   Increase activity slowly  As directed        Medication List    STOP taking these medications       metoprolol succinate 25 MG 24 hr tablet  Commonly known as:  TOPROL-XL      TAKE these medications       ALPRAZolam 1 MG tablet  Commonly known as:  XANAX  Take 1 mg by mouth 2 (two) times daily. Takes in evening and at bedtime     atorvastatin 80 MG tablet  Commonly known as:  LIPITOR  Take 1 tablet (80 mg total) by mouth daily at 6 PM.     ezetimibe 10 MG tablet  Commonly known as:  ZETIA  Take 10 mg by mouth at bedtime.     fluticasone 50 MCG/ACT nasal spray  Commonly known as:  FLONASE  Place 2 sprays into the nose daily as needed  for allergies.     furosemide 20 MG tablet  Commonly known as:  LASIX  Take 1 tablet (20 mg total) by mouth daily.     lisinopril 2.5 MG tablet  Commonly known as:  PRINIVIL,ZESTRIL  Take 2 tablets (5 mg total) by mouth daily.     multivitamin with minerals Tabs tablet  Take 1 tablet by mouth daily.     nitroGLYCERIN 0.4 MG SL tablet  Commonly known as:  NITROSTAT  Place 1 tablet (0.4 mg total) under the tongue every 5 (five) minutes x 3 doses as needed for chest pain.     potassium chloride 10 MEQ tablet  Commonly known as:  K-DUR,KLOR-CON  Take 10 mEq by mouth daily. Pt. States has not taken this med in a year because of GI  Problems     prasugrel 10 MG Tabs tablet  Commonly known as:  EFFIENT  Take 1 tablet (10 mg total) by mouth daily.     ranitidine 300 MG tablet  Commonly known as:  ZANTAC  Take 300 mg by mouth daily.     sertraline 50 MG tablet  Commonly known as:  ZOLOFT  Take 50 mg by mouth daily.     traMADol 50 MG tablet  Commonly known as:  ULTRAM  Take 50  mg by mouth every 6 (six) hours as needed for severe pain.           Follow-up Information   Follow up with Lamarcus Spira, PA-C. (The office scheduler will call you with the appt date and time.)    Specialty:  Physician Assistant   Contact information:   [address] [place] [address] [phone]       Signed: Wilburt Finlay 04/23/2013, 8:12 AM

## 2013-04-23 NOTE — Progress Notes (Addendum)
Patient Name: Brendan Butler Date of Encounter: 04/23/2013  Principal Problem:   Unstable angina Active Problems:   HYPERLIPIDEMIA   HYPERTENSION   SLEEP APNEA   STEMI 12/29/12 Rx'd with LAD DES with early stent thrombosis, STEMI-PCI 01/05/14   Aspirin allergy- hives   Illiteracy and low-level literacy   Leukocytosis   Length of Stay: 2  SUBJECTIVE  The patient is comfortable, no chest pain overnight, no SOB.    CURRENT MEDS . atorvastatin  80 mg Oral QHS  . ezetimibe  10 mg Oral QHS  . famotidine  20 mg Oral Daily  . furosemide  20 mg Oral Daily  . lisinopril  5 mg Oral Daily  . metoprolol succinate  25 mg Oral Daily  . multivitamin with minerals  1 tablet Oral Daily  . potassium chloride  10 mEq Oral Daily  . prasugrel  10 mg Oral Daily  . sertraline  50 mg Oral Daily   OBJECTIVE  Filed Vitals:   04/22/13 2000 04/22/13 2342 04/23/13 0000 04/23/13 0400  BP: 106/55  111/49 112/48  Pulse: 71   63  Temp: 99.4 F (37.4 C) 98.9 F (37.2 C)  98.5 F (36.9 C)  TempSrc: Oral Oral  Oral  Resp: 18   18  Height:      Weight:      SpO2: 96%   93%    Intake/Output Summary (Last 24 hours) at 04/23/13 0709 Last data filed at 04/23/13 0500  Gross per 24 hour  Intake 2195.87 ml  Output   2000 ml  Net 195.87 ml   Filed Weights   04/21/13 1322 04/21/13 1933  Weight: 225 lb (102.059 kg) 214 lb 4.6 oz (97.2 kg)   PHYSICAL EXAM  General: Pleasant, NAD. Neuro: Alert and oriented X 3. Moves all extremities spontaneously. Psych: Normal affect. HEENT:  Normal  Neck: Supple without bruits or JVD. Lungs:  Resp regular and unlabored, CTA. Heart: RRR no s3, s4, or murmurs. Abdomen: Soft, non-tender, non-distended, BS + x 4.  Extremities: No clubbing, cyanosis or edema. DP/PT/Radials 2+ and equal bilaterally. No bruit in the right groin  Accessory Clinical Findings  CBC  Recent Labs  04/21/13 1605 04/22/13 0321 04/23/13 0222  WBC 19.5* 16.2* 13.0*  NEUTROABS  15.7*  --   --   HGB 14.3 12.4* 11.6*  HCT 41.3 36.8* 34.4*  MCV 87.5 88.7 88.2  PLT 251 205 198   Basic Metabolic Panel  Recent Labs  04/22/13 0321 04/23/13 0222  NA 140 141  K 4.6 4.1  CL 104 104  CO2 24 26  GLUCOSE 114* 99  BUN 13 10  CREATININE 1.09 1.09  CALCIUM 8.5 8.7   Cardiac Enzymes  Recent Labs  04/21/13 1605 04/21/13 2200 04/22/13 0321  TROPONINI <0.30 <0.30 <0.30    Recent Labs  04/22/13 0321  CHOL 130  HDL 47  LDLCALC 61  TRIG 111  CHOLHDL 2.8   Radiology/Studies  Dg Chest Portable 1 View  04/21/2013  IMPRESSION: There is no evidence of pulmonary edema or definite evidence of pneumonia. Increased lung markings in the left suprahilar region are not clearly new. When the patient can tolerate the procedure, a PA and lateral chest x-ray would be useful.   Electronically Signed   By: David  [place]   On: 04/21/2013 14:38   TELE: SR  ECG: SR, normal ECG    ASSESSMENT AND PLAN  Principal Problem:  Unstable angina  Active Problems:  STEMI 12/29/12 Rx'd  with LAD DES with early stent thrombosis, STEMI-PCI 01/05/14  Aspirin allergy- hives  Leukocytosis  HYPERLIPIDEMIA  HYPERTENSION  SLEEP APNEA  Illiteracy and low-level literacy  63 year old male with h/o CAD, s/p anterior STEMI in Nov 2014 who presented with chest pain yesterday, his cath today showed focal normal LV function and focal 70% in-stent restenosis in the mid LAD stent immediately beyond the diagonal takeoff with a 30% narrowing at the ostium of this first diagonal vessel. A successful Flextome Cutting Balloon atherotomy dilated to 3.62 mm with the 70% stenosis being reduced to 0% . The patient complained of chest pain after arrival from the cath lab. IV nitroglycerin and iv morphine relived the pain. There were no new ECG changes. We will stop NTG, discharge home today. We will hold BB as he developed bradycardia after initiation. ASA/Plavix allergy, we will continue Effient, high dose  atorvastatin and lisinopril.   Signed, Tobias AlexanderNELSON, Emalina Dubreuil, H MD, [facility] 04/23/2013

## 2013-04-24 MED FILL — Sodium Chloride IV Soln 0.9%: INTRAVENOUS | Qty: 50 | Status: AC

## 2013-05-06 ENCOUNTER — Encounter: Payer: Self-pay | Admitting: Cardiology

## 2013-05-06 ENCOUNTER — Ambulatory Visit (INDEPENDENT_AMBULATORY_CARE_PROVIDER_SITE_OTHER): Payer: Medicare Other | Admitting: Cardiology

## 2013-05-06 VITALS — BP 110/60 | HR 80 | Ht 70.0 in | Wt 210.0 lb

## 2013-05-06 DIAGNOSIS — I10 Essential (primary) hypertension: Secondary | ICD-10-CM

## 2013-05-06 DIAGNOSIS — T39095A Adverse effect of salicylates, initial encounter: Secondary | ICD-10-CM

## 2013-05-06 DIAGNOSIS — E785 Hyperlipidemia, unspecified: Secondary | ICD-10-CM

## 2013-05-06 DIAGNOSIS — G4730 Sleep apnea, unspecified: Secondary | ICD-10-CM

## 2013-05-06 DIAGNOSIS — Z888 Allergy status to other drugs, medicaments and biological substances status: Secondary | ICD-10-CM

## 2013-05-06 DIAGNOSIS — I219 Acute myocardial infarction, unspecified: Secondary | ICD-10-CM

## 2013-05-06 DIAGNOSIS — I2510 Atherosclerotic heart disease of native coronary artery without angina pectoris: Secondary | ICD-10-CM

## 2013-05-06 DIAGNOSIS — I213 ST elevation (STEMI) myocardial infarction of unspecified site: Secondary | ICD-10-CM

## 2013-05-06 NOTE — Assessment & Plan Note (Signed)
Controlled.  

## 2013-05-06 NOTE — Assessment & Plan Note (Signed)
S/P 70% ISR Rx'd with HSRA 04/21/13. No angina

## 2013-05-06 NOTE — Patient Instructions (Signed)
Your physician recommends that you schedule a follow-up appointment in: 3 months with Dr Berry 

## 2013-05-06 NOTE — Assessment & Plan Note (Signed)
Treated

## 2013-05-06 NOTE — Progress Notes (Signed)
05/06/2013 Brendan Butler   [birth year]  [id number]  Primary Physicia Aida Puffer, MD Primary Cardiologist: Dr Allyson Sabal  HPI:  Brendan Butler is a 63 y.o. male who has a hx of CAD. He is s/p PCI to LAD in 2001. He had repeat cath in 2007 that revealed patent coronaries and patent stent. He was admitted 12/29/12 with an anterior STEMI and  underwent placement of an LAD DES. His EF was 35-40%. He was discharged 01/01/13 and re admitted 01/05/13 with another STEMI and was taken back to the lab. On the Cath Lab table he developed ventricular fibrillation 3 times, each time with rapid defibrillation back to sinus rhythm. He was found to have acute in-stent thrombosis of the recently placed LAD stent.  Dr. Excell Seltzer elected to dilate the entire stented segment again with a 3.5 mm noncompliant balloon. Following PCI, there was 0% residual stenosis and TIMI-3 flow. Final angiography confirmed an excellent result. His EF then was 40-45%. He was changed from Brilinta to Effient. At time of discharge, the patient expressed difficulty managing his medications at home. While reviewing his discharge instructions and medication orders, it was discovered that the patient has a low literacy level and he may not have been taking his medications correctly. He saw Dr Allyson Sabal in follow up 02/24/13 and was doing well. He had an echocardiogram on 03/12/13 and EF was 45-50%, severe hypokinesis of the distalanteroseptal myocardium.            He was re admitted 04/21/13 with [place]. Cath reveled 70% ISR that was treated with a cutting balloon. During that admission he was noted to be bradycardic and his beta blocker was discontinued. He has an ASA allergy (hives) and is not on ASA. Since discharge he has done well. In retrospect his chest pain this past admission was unlike (localized left sided) his pre MI pain.             Current Outpatient Prescriptions  Medication Sig Dispense Refill  . ALPRAZolam (XANAX) 1 MG tablet Take 1 mg  by mouth 2 (two) times daily. Takes in evening and at bedtime      . atorvastatin (LIPITOR) 80 MG tablet Take 1 tablet (80 mg total) by mouth daily at 6 PM.  30 tablet  5  . ezetimibe (ZETIA) 10 MG tablet Take 10 mg by mouth at bedtime.      . fluticasone (FLONASE) 50 MCG/ACT nasal spray Place 2 sprays into the nose daily as needed for allergies.       . furosemide (LASIX) 20 MG tablet Take 1 tablet (20 mg total) by mouth daily.  30 tablet  5  . lisinopril (PRINIVIL,ZESTRIL) 2.5 MG tablet Take 2 tablets (5 mg total) by mouth daily.  60 tablet  5  . Multiple Vitamin (MULTIVITAMIN WITH MINERALS) TABS tablet Take 1 tablet by mouth daily.      . nitroGLYCERIN (NITROSTAT) 0.4 MG SL tablet Place 1 tablet (0.4 mg total) under the tongue every 5 (five) minutes x 3 doses as needed for chest pain.  25 tablet  2  . potassium chloride (K-DUR,KLOR-CON) 10 MEQ tablet Take 10 mEq by mouth daily. Pt. States has not taken this med in a year because of GI  Problems      . prasugrel (EFFIENT) 10 MG TABS tablet Take 1 tablet (10 mg total) by mouth daily.  30 tablet  11  . ranitidine (ZANTAC) 300 MG tablet Take 300 mg by mouth daily.       Marland Kitchen  sertraline (ZOLOFT) 50 MG tablet Take 50 mg by mouth daily.       . traMADol (ULTRAM) 50 MG tablet Take 50 mg by mouth every 6 (six) hours as needed for severe pain.      Marland Kitchen. amoxicillin (AMOXIL) 250 MG capsule Take 250 mg by mouth 3 (three) times daily.      Marland Kitchen. buPROPion (WELLBUTRIN XL) 300 MG 24 hr tablet Take 300 mg by mouth daily.      Marland Kitchen. HYDROcodone-acetaminophen (NORCO) 7.5-325 MG per tablet Take 1 tablet by mouth every 4 (four) hours as needed.       No current facility-administered medications for this visit.    Allergies  Allergen Reactions  . Levitra [Vardenafil] Other (See Comments)    Blurred vision  . Aspirin Hives  . Clopidogrel Other (See Comments)    Not effective anti platelet med for patient-switched to effient    History   Social History  . Marital  Status: Divorced    Spouse Name: N/A    Number of Children: N/A  . Years of Education: N/A   Occupational History  . Not on file.   Social History Main Topics  . Smoking status: Former Games developermoker  . Smokeless tobacco: Not on file  . Alcohol Use: No  . Drug Use: No  . Sexual Activity: Not on file   Other Topics Concern  . Not on file   Social History Narrative  . No narrative on file     Review of Systems: General: negative for chills, fever, night sweats or weight changes.  Cardiovascular: negative for chest pain, dyspnea on exertion, edema, orthopnea, palpitations, paroxysmal nocturnal dyspnea or shortness of breath Dermatological: negative for rash Respiratory: negative for cough or wheezing Urologic: negative for hematuria Abdominal: negative for nausea, vomiting, diarrhea, bright red blood per rectum, melena, or hematemesis Neurologic: negative for visual changes, syncope, or dizziness All other systems reviewed and are otherwise negative except as noted above.    Blood pressure 110/60, pulse 80, height 5\' 10"  (1.778 m), weight 210 lb (95.255 kg).  General appearance: alert, cooperative and no distress Lungs: clear to auscultation bilaterally Heart: regular rate and rhythm Extremities: Rt groin without hematoma    ASSESSMENT AND PLAN:   STEMI 12/29/12 Rx'd with LAD DES with early stent thrombosis, STEMI-PCI 01/05/14 S/P 70% ISR Rx'd with HSRA 04/21/13. No angina  Aspirin allergy- hives .  HYPERTENSION Controlled  HYPERLIPIDEMIA Treated  SLEEP APNEA- C-pap intol .   PLAN  Same cardiac Rx, follow up with Dr Allyson SabalBerry in 3 months.  Datrell Dunton KPA-C 05/06/2013 5:01 PM

## 2013-05-11 ENCOUNTER — Other Ambulatory Visit (HOSPITAL_COMMUNITY): Payer: Self-pay | Admitting: Cardiology

## 2013-06-05 ENCOUNTER — Telehealth: Payer: Self-pay | Admitting: Cardiovascular Disease

## 2013-06-05 NOTE — Telephone Encounter (Signed)
Returned call and pt verified x 2.    Pt c/o  R ankle swelling, none on L ankle  Swelling worse in AM and goes away as day progresses  No CP or SOB, but intermittent "chest soreness" on L and R sides (describes pectoral area) that goes away on its own.  Had swelling before and had a change to medication "a while back"; not sure of change made  Sits in recliner most of the day and legs hang  Advice  Monitor Na+ intake (read labels and select healthier options w/ fast foods)  Elevate LEs above level of heart when reclining (use pillows or blanket)  Monitor for S/S of DVT ( redness, warmness, tenderness) and call office   If no improvement in 48 hrs, call back or ER for worsening symptoms  Pt verbalized understanding and agreed w/ plan.  Message forwarded to Dr. Allyson Sabal [facility])

## 2013-06-05 NOTE — Telephone Encounter (Signed)
Just had a heart procedure.Marland Kitchen Has had three heartattacks since November and is having some swelling in his right ankle. Please Call    Thanks

## 2013-06-11 ENCOUNTER — Emergency Department (HOSPITAL_COMMUNITY)
Admission: EM | Admit: 2013-06-11 | Discharge: 2013-06-11 | Disposition: A | Discharge status: Home / Self Care | Payer: Medicare Other | Attending: Emergency Medicine | Admitting: Emergency Medicine

## 2013-06-11 ENCOUNTER — Emergency Department (HOSPITAL_COMMUNITY): Payer: Medicare Other

## 2013-06-11 DIAGNOSIS — Z87891 Personal history of nicotine dependence: Secondary | ICD-10-CM | POA: Insufficient documentation

## 2013-06-11 DIAGNOSIS — I252 Old myocardial infarction: Secondary | ICD-10-CM | POA: Insufficient documentation

## 2013-06-11 DIAGNOSIS — I2510 Atherosclerotic heart disease of native coronary artery without angina pectoris: Secondary | ICD-10-CM | POA: Insufficient documentation

## 2013-06-11 DIAGNOSIS — E785 Hyperlipidemia, unspecified: Secondary | ICD-10-CM | POA: Insufficient documentation

## 2013-06-11 DIAGNOSIS — N529 Male erectile dysfunction, unspecified: Secondary | ICD-10-CM | POA: Insufficient documentation

## 2013-06-11 DIAGNOSIS — G4733 Obstructive sleep apnea (adult) (pediatric): Secondary | ICD-10-CM | POA: Insufficient documentation

## 2013-06-11 DIAGNOSIS — R079 Chest pain, unspecified: Secondary | ICD-10-CM | POA: Insufficient documentation

## 2013-06-11 DIAGNOSIS — I10 Essential (primary) hypertension: Secondary | ICD-10-CM | POA: Insufficient documentation

## 2013-06-11 LAB — CBC WITH DIFFERENTIAL/PLATELET
Basophils Absolute: 0 10*3/uL (ref 0.0–0.1)
Basophils Relative: 0 % (ref 0–1)
Eosinophils Absolute: 0 10*3/uL (ref 0.0–0.7)
Eosinophils Relative: 0 % (ref 0–5)
HCT: 41.1 % (ref 39.0–52.0)
Hemoglobin: 13.9 g/dL (ref 13.0–17.0)
Lymphocytes Relative: 5 % — ABNORMAL LOW (ref 12–46)
Lymphs Abs: 0.7 10*3/uL (ref 0.7–4.0)
MCH: 29.6 pg (ref 26.0–34.0)
MCHC: 33.8 g/dL (ref 30.0–36.0)
MCV: 87.4 fL (ref 78.0–100.0)
Monocytes Absolute: 1.9 10*3/uL — ABNORMAL HIGH (ref 0.1–1.0)
Monocytes Relative: 12 % (ref 3–12)
Neutro Abs: 13.5 10*3/uL — ABNORMAL HIGH (ref 1.7–7.7)
Neutrophils Relative %: 83 % — ABNORMAL HIGH (ref 43–77)
Platelets: 208 10*3/uL (ref 150–400)
RBC: 4.7 MIL/uL (ref 4.22–5.81)
RDW: 13.2 % (ref 11.5–15.5)
WBC: 16.1 10*3/uL — ABNORMAL HIGH (ref 4.0–10.5)

## 2013-06-11 LAB — BASIC METABOLIC PANEL
BUN: 9 mg/dL (ref 6–23)
CO2: 25 mEq/L (ref 19–32)
Calcium: 9.2 mg/dL (ref 8.4–10.5)
Chloride: 99 mEq/L (ref 96–112)
Creatinine, Ser: 0.99 mg/dL (ref 0.50–1.35)
GFR calc Af Amer: >90 mL/min (ref >90)
GFR calc non Af Amer: 87 mL/min — ABNORMAL LOW (ref >90)
Glucose, Bld: 99 mg/dL (ref 70–99)
Potassium: 4.3 mEq/L (ref 3.7–5.3)
Sodium: 139 mEq/L (ref 137–147)

## 2013-06-11 LAB — TROPONIN I
Troponin I: <0.3 ng/mL (ref <0.30)
Troponin I: <0.3 ng/mL (ref <0.30)

## 2013-06-11 NOTE — ED Notes (Signed)
Pt st's he had chest pain earlier today at home and took a NTG with relief.  St's was told by his MD that if he ever needed to take NTG for chest pain that he needed to come to ED.  Pt alert and oriented x's 3, skin warm and dry color appropriate.

## 2013-06-11 NOTE — ED Notes (Signed)
Has left and right chest pain and moves back and forth.  Received nitro allergic to aspirin.  Pain has lingered throughout the day.  Received zofran iv.

## 2013-06-11 NOTE — ED Notes (Signed)
Transporting patient to new room assignment. 

## 2013-06-11 NOTE — Discharge Instructions (Signed)
Chest Pain (Nonspecific) °It is often hard to give a specific diagnosis for the cause of chest pain. There is always a chance that your pain could be related to something serious, such as a heart attack or a blood clot in the lungs. You need to follow up with your caregiver for further evaluation. °CAUSES  °· Heartburn. °· Pneumonia or bronchitis. °· Anxiety or stress. °· Inflammation around your heart (pericarditis) or lung (pleuritis or pleurisy). °· A blood clot in the lung. °· A collapsed lung (pneumothorax). It can develop suddenly on its own (spontaneous pneumothorax) or from injury (trauma) to the chest. °· Shingles infection (herpes zoster virus). °The chest wall is composed of bones, muscles, and cartilage. Any of these can be the source of the pain. °· The bones can be bruised by injury. °· The muscles or cartilage can be strained by coughing or overwork. °· The cartilage can be affected by inflammation and become sore (costochondritis). °DIAGNOSIS  °Lab tests or other studies, such as X-rays, electrocardiography, stress testing, or cardiac imaging, may be needed to find the cause of your pain.  °TREATMENT  °· Treatment depends on what may be causing your chest pain. Treatment may include: °· Acid blockers for heartburn. °· Anti-inflammatory medicine. °· Pain medicine for inflammatory conditions. °· Antibiotics if an infection is present. °· You may be advised to change lifestyle habits. This includes stopping smoking and avoiding alcohol, caffeine, and chocolate. °· You may be advised to keep your head raised (elevated) when sleeping. This reduces the chance of acid going backward from your stomach into your esophagus. °· Most of the time, nonspecific chest pain will improve within 2 to 3 days with rest and mild pain medicine. °HOME CARE INSTRUCTIONS  °· If antibiotics were prescribed, take your antibiotics as directed. Finish them even if you start to feel better. °· For the next few days, avoid physical  activities that bring on chest pain. Continue physical activities as directed. °· Do not smoke. °· Avoid drinking alcohol. °· Only take over-the-counter or prescription medicine for pain, discomfort, or fever as directed by your caregiver. °· Follow your caregiver's suggestions for further testing if your chest pain does not go away. °· Keep any follow-up appointments you made. If you do not go to an appointment, you could develop lasting (chronic) problems with pain. If there is any problem keeping an appointment, you must call to reschedule. °SEEK MEDICAL CARE IF:  °· You think you are having problems from the medicine you are taking. Read your medicine instructions carefully. °· Your chest pain does not go away, even after treatment. °· You develop a rash with blisters on your chest. °SEEK IMMEDIATE MEDICAL CARE IF:  °· You have increased chest pain or pain that spreads to your arm, neck, jaw, back, or abdomen. °· You develop shortness of breath, an increasing cough, or you are coughing up blood. °· You have severe back or abdominal pain, feel nauseous, or vomit. °· You develop severe weakness, fainting, or chills. °· You have a fever. °THIS IS AN EMERGENCY. Do not wait to see if the pain will go away. Get medical help at once. Call your local emergency services (911 in [place] Do not drive yourself to the hospital. °MAKE SURE YOU:  °· Understand these instructions. °· Will watch your condition. °· Will get help right away if you are not doing well or get worse. °Document Released: 11/22/2004 Document Revised: 05/07/2011 Document Reviewed: 09/18/2007 °ExitCare® Patient Information ©2014 [employer]   [employer]. ° °

## 2013-06-12 ENCOUNTER — Telehealth: Payer: Self-pay | Admitting: Cardiovascular Disease

## 2013-06-12 NOTE — Telephone Encounter (Signed)
Called no answer. Called to see if patient need an follow up with cardiologist or extender

## 2013-06-12 NOTE — Telephone Encounter (Signed)
Pt went to [facility] ER yesterday with chest pains,he was told to notify his doctor.He does not have any chest pains today.

## 2013-06-15 NOTE — Telephone Encounter (Signed)
Left pt. Message to call us back or to make an appt.

## 2013-06-21 NOTE — ED Provider Notes (Signed)
CSN: [id number]     Arrival date & time 06/11/13  1757 History   First MD Initiated Contact with Patient 06/11/13 1828     Chief Complaint  Patient presents with  . Chest Pain  . Emesis     (Consider location/radiation/quality/duration/timing/severity/associated sxs/prior Treatment) HPI  63 year old male with chest pain. Onset this morning. Resolving currently has no complaints. Patient weighs his hand across his entire precordium when asked where the pain was.  Did take a nitroglycerin and resolved shortly later. Total duration was only a couple minutes. No associated symptoms such as diaphoresis, shortness of breath or palpitations her nausea. He cannot remember what he was doing at onset. No fevers or chills. No cough. No unusual leg pain or swelling. Denies any drug use.  Past Medical History  Diagnosis Date  . Hypertension   . Coronary artery disease 2001    stent to LAD and patent 2007 on cath  . Acute myocardial infarction of other anterior wall, initial episode of care 12/29/12    Promus stent to LAD  . Acute ST segment elevation MI 01/05/13    secondary to thrombus in stent; Brilintta changed to Effient pt with ASA allergy  . OSA (obstructive sleep apnea)   . Hyperlipidemia LDL goal <70   . Erectile dysfunction   . Echocardiogram abnormal 01/06/13    EF 45-50%, grade I diastolic dysfunction   Past Surgical History  Procedure Laterality Date  . Coronary angioplasty with stent placement  2001    to LAD  . Cardiac catheterization  2007    patent stent to LAD and normal Cors  . Coronary angioplasty with stent placement  12/29/12    STEMI with promus DES to LAD  . Coronary angioplasty with stent placement  01/05/13    STEMI with thrombosis in stent to LAD  . Back surgery  2007    Decompression lumbar laminectomy and micro discectomy, L4-5   Family History  Problem Relation Age of Onset  . Alzheimer's disease Mother    History  Substance Use Topics  . Smoking status:  Former Games developermoker  . Smokeless tobacco: Not on file  . Alcohol Use: No    Review of Systems  All systems reviewed and negative, other than as noted in HPI.   Allergies  Levitra; Aspirin; and Clopidogrel  Home Medications   Prior to Admission medications   Medication Sig Start Date End Date Taking? Authorizing Provider  acetaminophen (TYLENOL) 325 MG tablet Take 650 mg by mouth every 6 (six) hours as needed for moderate pain.   Yes Historical Provider, MD  ALPRAZolam Prudy Feeler(XANAX) 1 MG tablet Take 1 mg by mouth 2 (two) times daily. Takes in evening and at bedtime   Yes Historical Provider, MD  atorvastatin (LIPITOR) 80 MG tablet Take 1 tablet (80 mg total) by mouth daily at 6 PM. 01/08/13  Yes Brittainy Sharol HarnessSimmons, PA-C  buPROPion (WELLBUTRIN XL) 300 MG 24 hr tablet Take 300 mg by mouth daily. 04/17/13  Yes Historical Provider, MD  ezetimibe (ZETIA) 10 MG tablet Take 10 mg by mouth at bedtime.   Yes Historical Provider, MD  fluticasone (FLONASE) 50 MCG/ACT nasal spray Place 2 sprays into the nose daily as needed for allergies.    Yes Historical Provider, MD  furosemide (LASIX) 20 MG tablet Take 1 tablet (20 mg total) by mouth daily. 01/02/13  Yes Brittainy Simmons, PA-C  lisinopril (PRINIVIL,ZESTRIL) 2.5 MG tablet Take 2.5 mg by mouth 2 (two) times daily.   Yes Historical  Provider, MD  Multiple Vitamin (MULTIVITAMIN WITH MINERALS) TABS tablet Take 1 tablet by mouth daily.   Yes Historical Provider, MD  nitroGLYCERIN (NITROSTAT) 0.4 MG SL tablet Place 1 tablet (0.4 mg total) under the tongue every 5 (five) minutes x 3 doses as needed for chest pain. 01/01/13  Yes Brittainy Simmons, PA-C  OVER THE COUNTER MEDICATION Take 1 tablet by mouth daily. "potassium"   Yes Historical Provider, MD  prasugrel (EFFIENT) 10 MG TABS tablet Take 1 tablet (10 mg total) by mouth daily. 03/11/13  Yes Runell Gess, MD  ranitidine (ZANTAC) 300 MG tablet Take 300 mg by mouth daily.    Yes Historical Provider, MD  sertraline  (ZOLOFT) 50 MG tablet Take 50 mg by mouth daily.  02/15/13  Yes Historical Provider, MD   BP 134/78  Pulse 79  Temp(Src) 98 F (36.7 C) (Oral)  Resp 18  Ht 5\' 10"  (1.778 m)  Wt 216 lb (97.977 kg)  BMI 30.99 kg/m2  SpO2 98% Physical Exam  Nursing note and vitals reviewed. Constitutional: He appears well-developed and well-nourished. No distress.  HENT:  Head: Normocephalic and atraumatic.  Eyes: Conjunctivae are normal. Right eye exhibits no discharge. Left eye exhibits no discharge.  Neck: Neck supple.  Cardiovascular: Normal rate, regular rhythm and normal heart sounds.  Exam reveals no gallop and no friction rub.   No murmur heard. Pulmonary/Chest: Effort normal and breath sounds normal. No respiratory distress. He exhibits no tenderness.  Abdominal: Soft. He exhibits no distension. There is no tenderness.  Musculoskeletal: He exhibits no edema and no tenderness.  Neurological: He is alert.  Lower extremities symmetric as compared to each other. No calf tenderness. Negative Homan's. No palpable cords.   Skin: Skin is warm and dry.  Psychiatric: He has a normal mood and affect. His behavior is normal. Thought content normal.    ED Course  Procedures (including critical care time) Labs Review Labs Reviewed  CBC WITH DIFFERENTIAL - Abnormal; Notable for the following:    WBC 16.1 (*)    Neutrophils Relative % 83 (*)    Neutro Abs 13.5 (*)    Lymphocytes Relative 5 (*)    Monocytes Absolute 1.9 (*)    All other components within normal limits  BASIC METABOLIC PANEL - Abnormal; Notable for the following:    GFR calc non Af Amer 87 (*)    All other components within normal limits  TROPONIN I  TROPONIN I    Imaging Review No results found.  Dg Chest 2 View  06/11/2013   CLINICAL DATA:  Chest pain  DG CHEST 2 VIEW dated 04/21/2013  EXAM: CHEST  2 VIEW  COMPARISON:  DG CHEST 2 VIEW dated 04/21/2013  FINDINGS: The heart size and mediastinal contours are within upper limits  of normal. Both lungs are clear. The visualized skeletal structures are unremarkable.  IMPRESSION: No active cardiopulmonary disease.   Electronically Signed   By: Salome Holmes M.D.   On: 06/11/2013 19:38    EKG Interpretation None     EKG:  Rhythm: normal sinus Rate: 82 Intervals: normal ST segments: ns st changes   MDM   Final diagnoses:  Chest pain    [age]-year-old male with chest pain. This chest pain did improve with nitroglycerin, otherwise is atypical for ACS. EKG does not show any acute changes. Troponin normal x2 with the second one being drawn more than 6 hours after symptom onset. Doubt infectious. Doubt pulmonary embolism or dissection. Return precautions were  discussed. Outpatient followup otherwise.    Raeford Razor, MD 06/21/13 [phone]

## 2013-08-18 ENCOUNTER — Encounter: Payer: Self-pay | Admitting: Cardiovascular Disease

## 2013-08-18 ENCOUNTER — Ambulatory Visit (INDEPENDENT_AMBULATORY_CARE_PROVIDER_SITE_OTHER): Payer: Medicare Other | Admitting: Cardiovascular Disease

## 2013-08-18 VITALS — BP 136/76 | HR 64 | Ht 70.0 in | Wt 219.6 lb

## 2013-08-18 DIAGNOSIS — E785 Hyperlipidemia, unspecified: Secondary | ICD-10-CM

## 2013-08-18 DIAGNOSIS — I10 Essential (primary) hypertension: Secondary | ICD-10-CM

## 2013-08-18 DIAGNOSIS — I2510 Atherosclerotic heart disease of native coronary artery without angina pectoris: Secondary | ICD-10-CM

## 2013-08-18 NOTE — Assessment & Plan Note (Signed)
On statin therapy with recent lipid profile performed 04/22/13 revealing a total cholesterol of 1:30, LDL 61 HDL of 47. His primary care physician, Dr. Aida Puffer , recently discontinued Zetia for unclear reasons.

## 2013-08-18 NOTE — Progress Notes (Signed)
08/18/2013 [name]   02/05/1951  [id number]  Primary Physician Aida Puffer, MD Primary Cardiologist: Runell Gess MD [name]   HPI:  The patient is a 62y/o male, s/p anterior STEMI on 12/29/12, who presented back to [facility], one week later, on 01/05/13 with another STEMI. He is followed by Dr. Allyson Sabal. On 12/29/12 he underwent emergent PCI to the mid LAD, utilizing a DES. The procedure was performed by Dr. Eldridge Dace. He was placed on mono-antiplatelet therapy with Brilinta. He was not placed on ASA due to an allergy. A 2D echo was obtained 2 days post STEMI and demonstrated an EF of 35-40% with large anteroapical WMA. He was sent home 01/01/13. He was discharged on Brilinta, Toprol, Lisinopril, Lipitor, Zetia and Lasix.  When he returned on 11/10, the patient was apparently in his normal state of health until developing severe substernal chest pressure roughly 1 hour prior to arrival . EMS was called. On arrival, an EKG demonstrated ST elevations. Code STEMI was activated and the patient was transported urgently to the [facility]. The procedure was performed by Dr. Excell Seltzer. On the Cath Lab table he developed ventricular fibrillation 3 times, each time with rapid defibrillation back to sinus rhythm. He was treated with intravenous amiodarone boluses followed by a drip. He was found to have acute in-stent thrombosis of the newly placed LAD stent. The LAD was totally occluded in the proximal vessel at the proximal edge of the stented segment. The lesion was predilated with a 2.5 mm balloon followed by a 3.0 mm noncompliant balloon. Intravascular ultrasound was then performed in order to define the mechanism of stent thrombosis. The previously implanted stent appeared well expanded throughout. There was an area of mal apposition in the distal stent. Dr. Excell Seltzer elected to dilate the entire stented segment again with a 3.5 mm noncompliant balloon. Following PCI, there was 0%  residual stenosis and TIMI-3 flow. Final angiography confirmed an excellent result. The patient tolerated the PCI procedure well. His chest pain resolved. There were no immediate post procedural complications. The patient was transferred to the post catheterization recovery area for further monitoring, then was sent to the CCU. His Brilinta was discontinued and he was placed on Effient, as the patient has no past history of CVA/TIA. The right femoral access site remained stable, free from hematoma and bruit. Post-cath EKG demonstrated normalization of ST elevations. Troponin levels trended downward. He had no further arrhthymias on telemetry and Amiodarone was discontinued. A 2D echo on 01/06/13 demonstrated mild-moderately reduced systolic function. His EF was estimated at 40-45%. There was anterior, anteroseptal and apical hypokinesis, consistent with LAD territory ischemia/infarct. Both his BB and ACE-I were titrated upwards. He was discharged home on Effient, Toprol XL, lisinopril, Lipitor, Zetia, Lasix and PRN NTG.  At time of discharge, the patient expressed difficulty managing his medications at home. While reviewing his discharge instructions and medication orders, it was discovered that the patient has a low literacy level. Due to the fact that he lives home alone, there was concern that his low literacy level may interfere with medication compliance. Case Management was consulted to arrange for a home health nurse to visit with the patient weekly, to help organize his medicines correctly. [facility] was also consulted. They will place his Effient in a special red colored pill top, so that the patient will remember to take it every day without fail.  He returned with recurrent chest pain in February of this year and  underwent cardiac catheterization by Dr. Daphene Jaegerom Kelly revealing a 70% in-stent restenosis" at the distal edge of the proximal LAD stent. He underwent cutting balloon enteroplasty excellent  angiographic and clinical result. He has had no recurrent chest pain.    Current Outpatient Prescriptions  Medication Sig Dispense Refill  . acetaminophen (TYLENOL) 325 MG tablet Take 650 mg by mouth every 6 (six) hours as needed for moderate pain.      Marland Kitchen. ALPRAZolam (XANAX) 1 MG tablet Take 1 mg by mouth 2 (two) times daily. Takes in evening and at bedtime      . atorvastatin (LIPITOR) 80 MG tablet Take 1 tablet (80 mg total) by mouth daily at 6 PM.  30 tablet  5  . buPROPion (WELLBUTRIN) 100 MG tablet Take 100 mg by mouth 2 (two) times daily.      . furosemide (LASIX) 20 MG tablet Take 1 tablet (20 mg total) by mouth daily.  30 tablet  5  . lisinopril (PRINIVIL,ZESTRIL) 2.5 MG tablet Take 2.5 mg by mouth 2 (two) times daily.      . Multiple Vitamin (MULTIVITAMIN WITH MINERALS) TABS tablet Take 1 tablet by mouth daily.      . nitroGLYCERIN (NITROSTAT) 0.4 MG SL tablet Place 1 tablet (0.4 mg total) under the tongue every 5 (five) minutes x 3 doses as needed for chest pain.  25 tablet  2  . OVER THE COUNTER MEDICATION Take 1 tablet by mouth daily. "potassium"      . potassium chloride (K-DUR,KLOR-CON) 10 MEQ tablet Take 1 tablet by mouth daily.      . prasugrel (EFFIENT) 10 MG TABS tablet Take 1 tablet (10 mg total) by mouth daily.  30 tablet  11  . ranitidine (ZANTAC) 300 MG tablet Take 300 mg by mouth daily.       . sertraline (ZOLOFT) 50 MG tablet Take 50 mg by mouth daily.       Marland Kitchen. terbinafine (LAMISIL) 250 MG tablet Take 1 tablet by mouth daily.       No current facility-administered medications for this visit.    Allergies  Allergen Reactions  . Levitra [Vardenafil] Other (See Comments)    Blurred vision  . Aspirin Hives  . Clopidogrel Other (See Comments)    Not effective anti platelet med for patient-switched to effient    History   Social History  . Marital Status: Divorced    Spouse Name: N/A    Number of Children: N/A  . Years of Education: N/A   Occupational History    . Not on file.   Social History Main Topics  . Smoking status: Former Games developermoker  . Smokeless tobacco: Current User  . Alcohol Use: No  . Drug Use: No  . Sexual Activity: Not on file   Other Topics Concern  . Not on file   Social History Narrative  . No narrative on file     Review of Systems: General: negative for chills, fever, night sweats or weight changes.  Cardiovascular: negative for chest pain, dyspnea on exertion, edema, orthopnea, palpitations, paroxysmal nocturnal dyspnea or shortness of breath Dermatological: negative for rash Respiratory: negative for cough or wheezing Urologic: negative for hematuria Abdominal: negative for nausea, vomiting, diarrhea, bright red blood per rectum, melena, or hematemesis Neurologic: negative for visual changes, syncope, or dizziness All other systems reviewed and are otherwise negative except as noted above.    Blood pressure 136/76, pulse 64, height 5\' 10"  (1.778 m), weight 219 lb 9.6 oz (  99.61 kg).  General appearance: alert and no distress Neck: no adenopathy, no carotid bruit, no JVD, supple, symmetrical, trachea midline and thyroid not enlarged, symmetric, no tenderness/mass/nodules Lungs: clear to auscultation bilaterally Heart: regular rate and rhythm, S1, S2 normal, no murmur, click, rub or gallop Extremities: extremities normal, atraumatic, no cyanosis or edema  EKG normal sinus rhythm at 64 without ST or T wave changes  ASSESSMENT AND PLAN:   CAD S/P STEMI 12/29/12 Mid LAD occlusion treated with DES History of CAD status post Tammy 12/29/12 she was LAD intervention. He head preintervention by Dr. Excell Seltzer in the setting of acute in-stent thrombosis, anterior wall myocardial infarction and VF arrest.. During the procedure defibrillated 3 times on the table requiring shock. He was readmitted at the end of February of this year with recurrent chest pain and underwent cardiac catheterization by Dr. Tresa Endo revealing 70% in-stent  restenosis within the distal edge of the stent requiring cutting balloon angioplasty.since discharge he has had no recurrent chest pain on dual antiplatelet therapy  HYPERLIPIDEMIA On statin therapy with recent lipid profile performed 04/22/13 revealing a total cholesterol of 1:30, LDL 61 HDL of 47. His primary care physician, Dr. Aida Puffer , recently discontinued Zetia for unclear reasons.  HYPERTENSION Controlled on current medications      Runell Gess MD [facility], [facility] 08/18/2013 8:41 AM

## 2013-08-18 NOTE — Assessment & Plan Note (Signed)
History of CAD status post Tammy 12/29/12 she was LAD intervention. He head preintervention by Dr. Excell Seltzer in the setting of acute in-stent thrombosis, anterior wall myocardial infarction and VF arrest.. During the procedure defibrillated 3 times on the table requiring shock. He was readmitted at the end of February of this year with recurrent chest pain and underwent cardiac catheterization by Dr. Tresa Endo revealing 70% in-stent restenosis within the distal edge of the stent requiring cutting balloon angioplasty.since discharge he has had no recurrent chest pain on dual antiplatelet therapy

## 2013-08-18 NOTE — Patient Instructions (Signed)
We request that you follow-up in: 6 months with Luke Kilroy PA and in 1 year with Dr Berry  You will receive a reminder letter in the mail two months in advance. If you don't receive a letter, please call our office to schedule the follow-up appointment.   

## 2013-08-18 NOTE — Assessment & Plan Note (Signed)
Controlled on current medications 

## 2013-09-02 ENCOUNTER — Telehealth: Payer: Self-pay | Admitting: Cardiovascular Disease

## 2013-09-02 NOTE — Telephone Encounter (Signed)
Pt called and said he is having terrible leg cramps,he thinks it is coming from his Lipitor.

## 2013-09-02 NOTE — Telephone Encounter (Signed)
Spoke with pt, he will not take the lipitor for 2 weeks. He will call and let us know if his symptoms improve or not. Pt agreed with this plan.

## 2014-02-04 ENCOUNTER — Encounter (HOSPITAL_COMMUNITY): Payer: Self-pay | Admitting: Interventional Cardiology

## 2014-09-30 ENCOUNTER — Telehealth: Payer: Self-pay | Admitting: Cardiovascular Disease

## 2014-09-30 NOTE — Telephone Encounter (Signed)
Pt is calling in requesting a letter or note from Dr. Allyson Sabal describing the situations that have happened in the past in regards to the heart attacks that he has had and the damage it has done to his heart and moving forward what precautions he should take. He is needing this because he has to go to court next week in regards to a civil suit he has against his son. Please call him and f/u with him. He needs the note by Friday.   Thanks

## 2014-09-30 NOTE — Telephone Encounter (Signed)
Spoke with pt, a copy of the last office note placed at the front desk for pt pick up

## 2014-10-06 ENCOUNTER — Telehealth: Payer: Self-pay | Admitting: Cardiovascular Disease

## 2014-10-06 NOTE — Telephone Encounter (Signed)
Calling because his medication for Wednesday is gone, not sure if he took his medication for today . Wants to know what shall he do . Please call, please lmsg if he does not answer     Thanks

## 2014-10-06 NOTE — Telephone Encounter (Signed)
Returned call to patient he stated he has been under a lot of stress.Stated his son who is on cocaine took him to court.Stated he fell asleep early around 7:00 pm last night and slept hard did not wake up until this morning.Stated he prepares his 7 day medication box and when he woke up his Wednesday medications were gone.He lives alone so no one else took them.Advised he might have woke up and took them and does not remember since he slept so hard.Advised do not retake.Advised take medications tomorrow as normal.

## 2015-01-12 IMAGING — CR DG CHEST 1V PORT
1 series · 1 of 1 positions shown · non-contrast
Comparison: 06/20/2011 and earlier.

CLINICAL DATA: 61-year-old male. Code STEMI. Initial encounter.

EXAM:
PORTABLE CHEST - 1 VIEW

[AP]
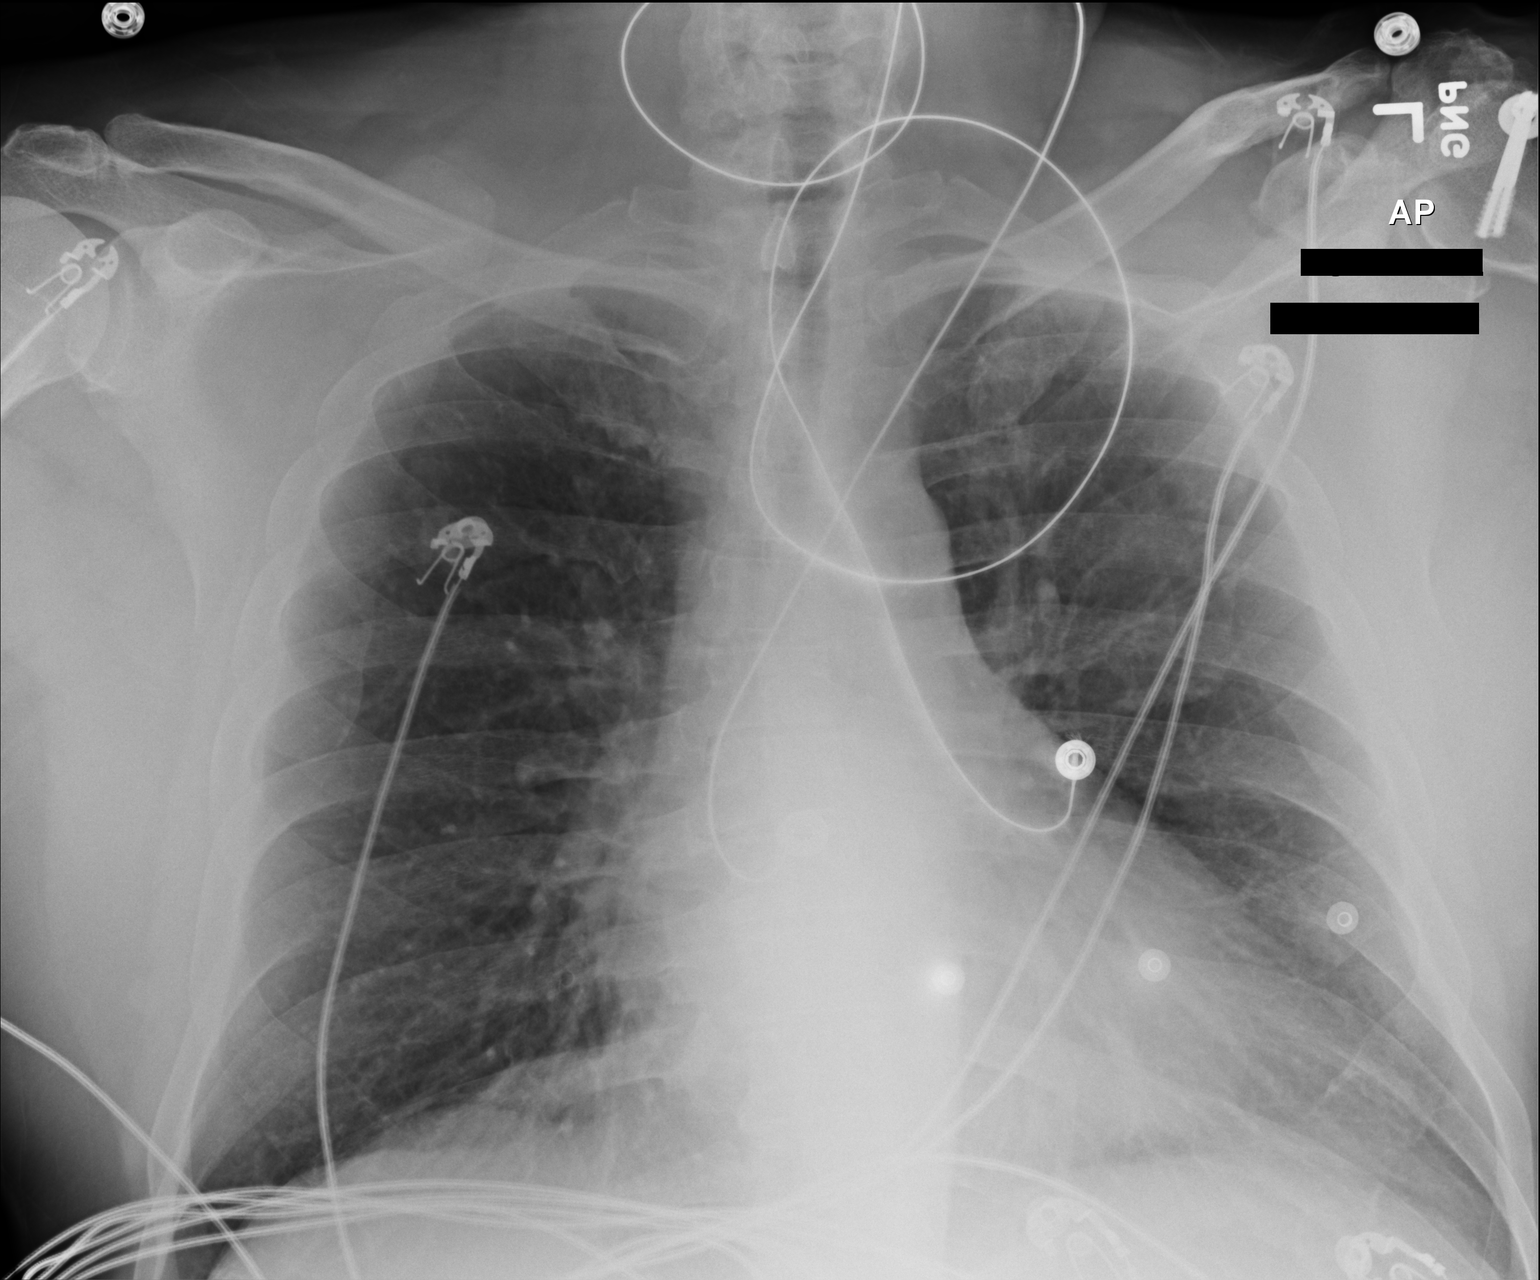

[1 of 1 positions shown; findings below may reference images not displayed]

FINDINGS: Portable AP semi upright view at 4435 hrs. Stable lung volumes. Mild
vascular congestion without overt pulmonary edema. Otherwise the
lungs are clear. Normal cardiac size and mediastinal contours.
Visualized tracheal air column is within normal limits. Chronic
postoperative changes to the left shoulder.
IMPRESSION: Mild vascular congestion without overt edema and otherwise no acute
cardiopulmonary abnormality.

## 2015-05-09 ENCOUNTER — Other Ambulatory Visit: Payer: Self-pay | Admitting: *Deleted

## 2015-05-09 MED ORDER — NITROGLYCERIN 0.4 MG SL SUBL: 0.4000 mg | SUBLINGUAL_TABLET | SUBLINGUAL | Status: DC | PRN

## 2015-05-09 NOTE — Telephone Encounter (Signed)
REFILL 

## 2015-06-25 IMAGING — CR DG CHEST 2V
2 series · 2 of 2 positions shown · non-contrast
Comparison: DG CHEST 2 VIEW dated 04/21/2013

CLINICAL DATA: Chest pain

DG CHEST 2 VIEW dated 04/21/2013
EXAM:
CHEST  2 VIEW

[w chest pa]
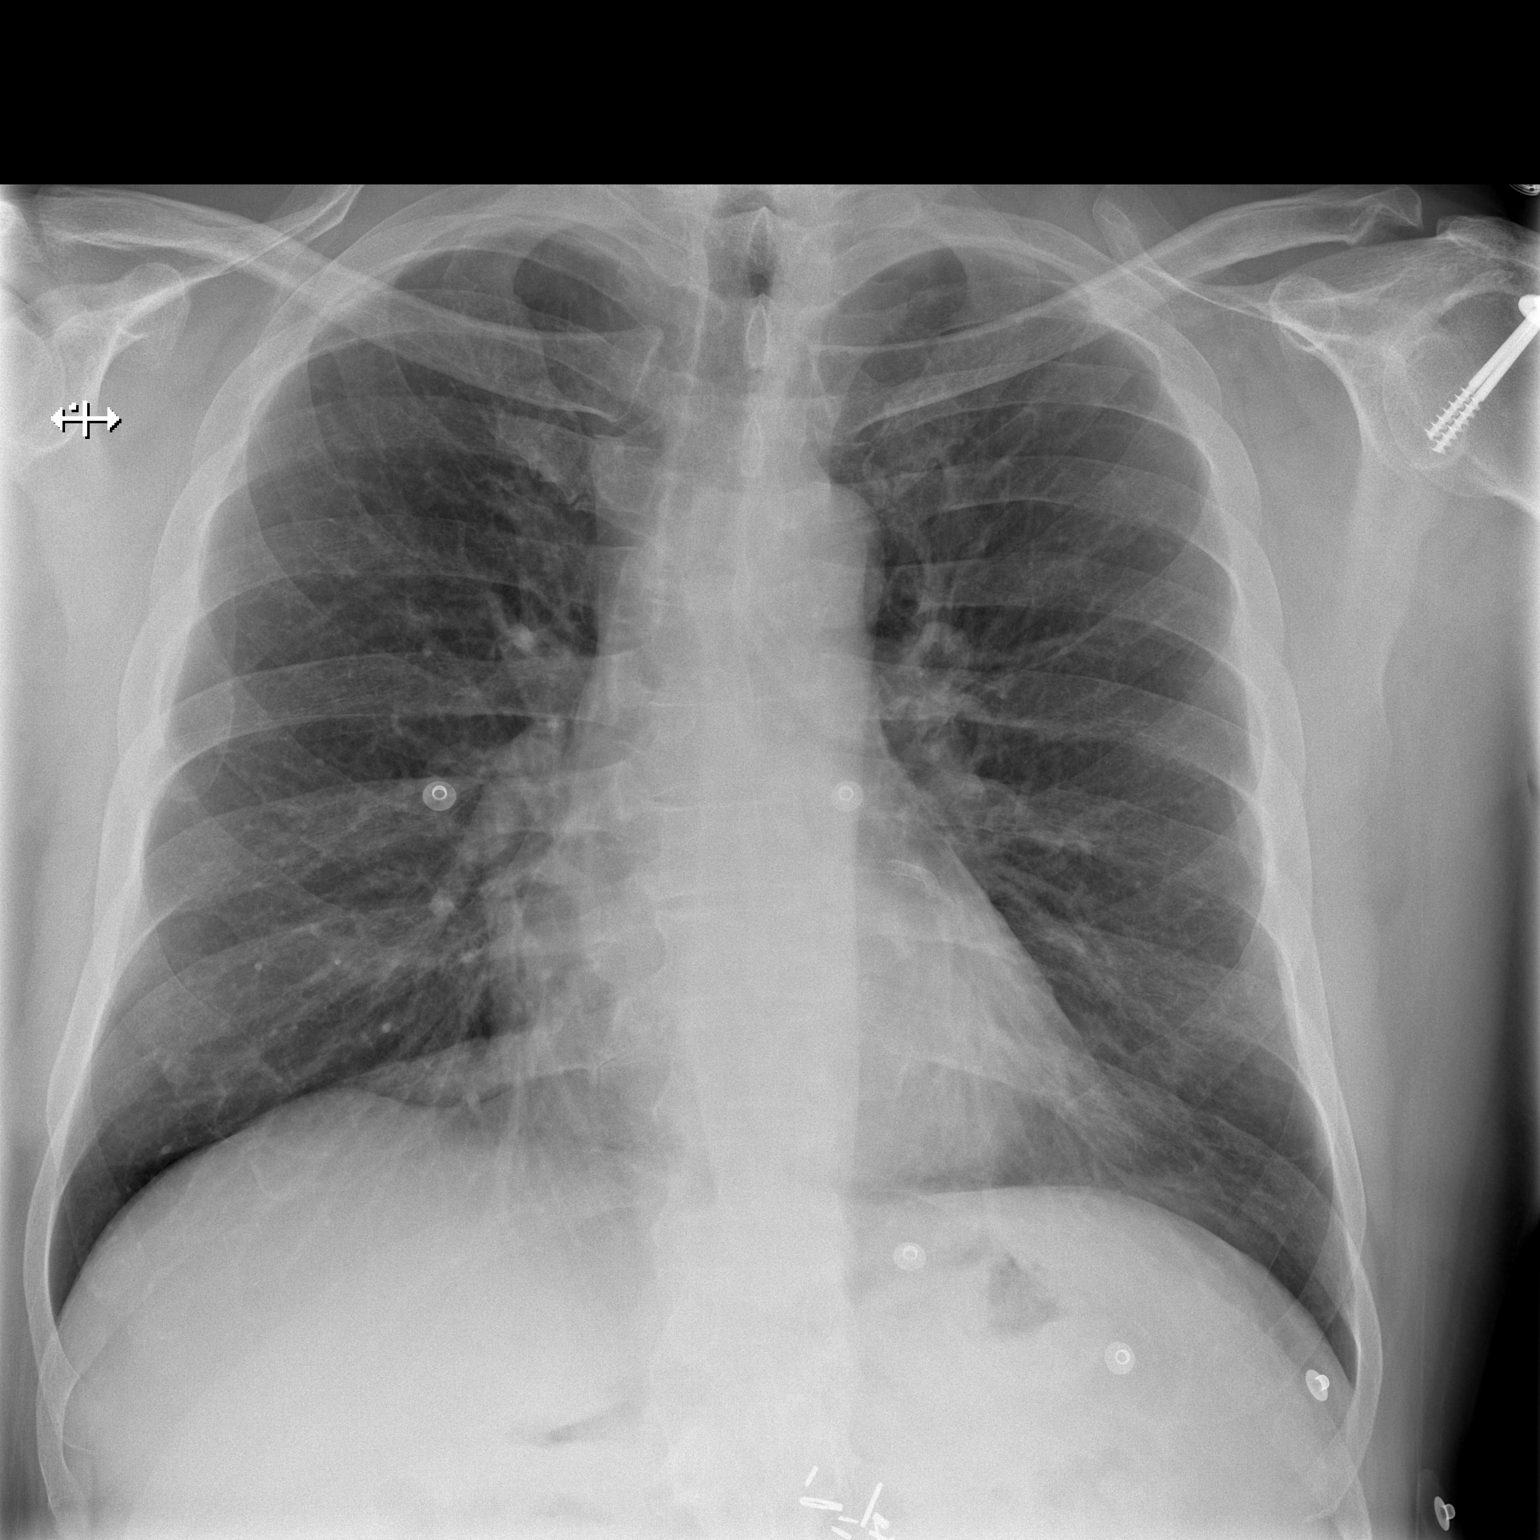

[w chest lat]
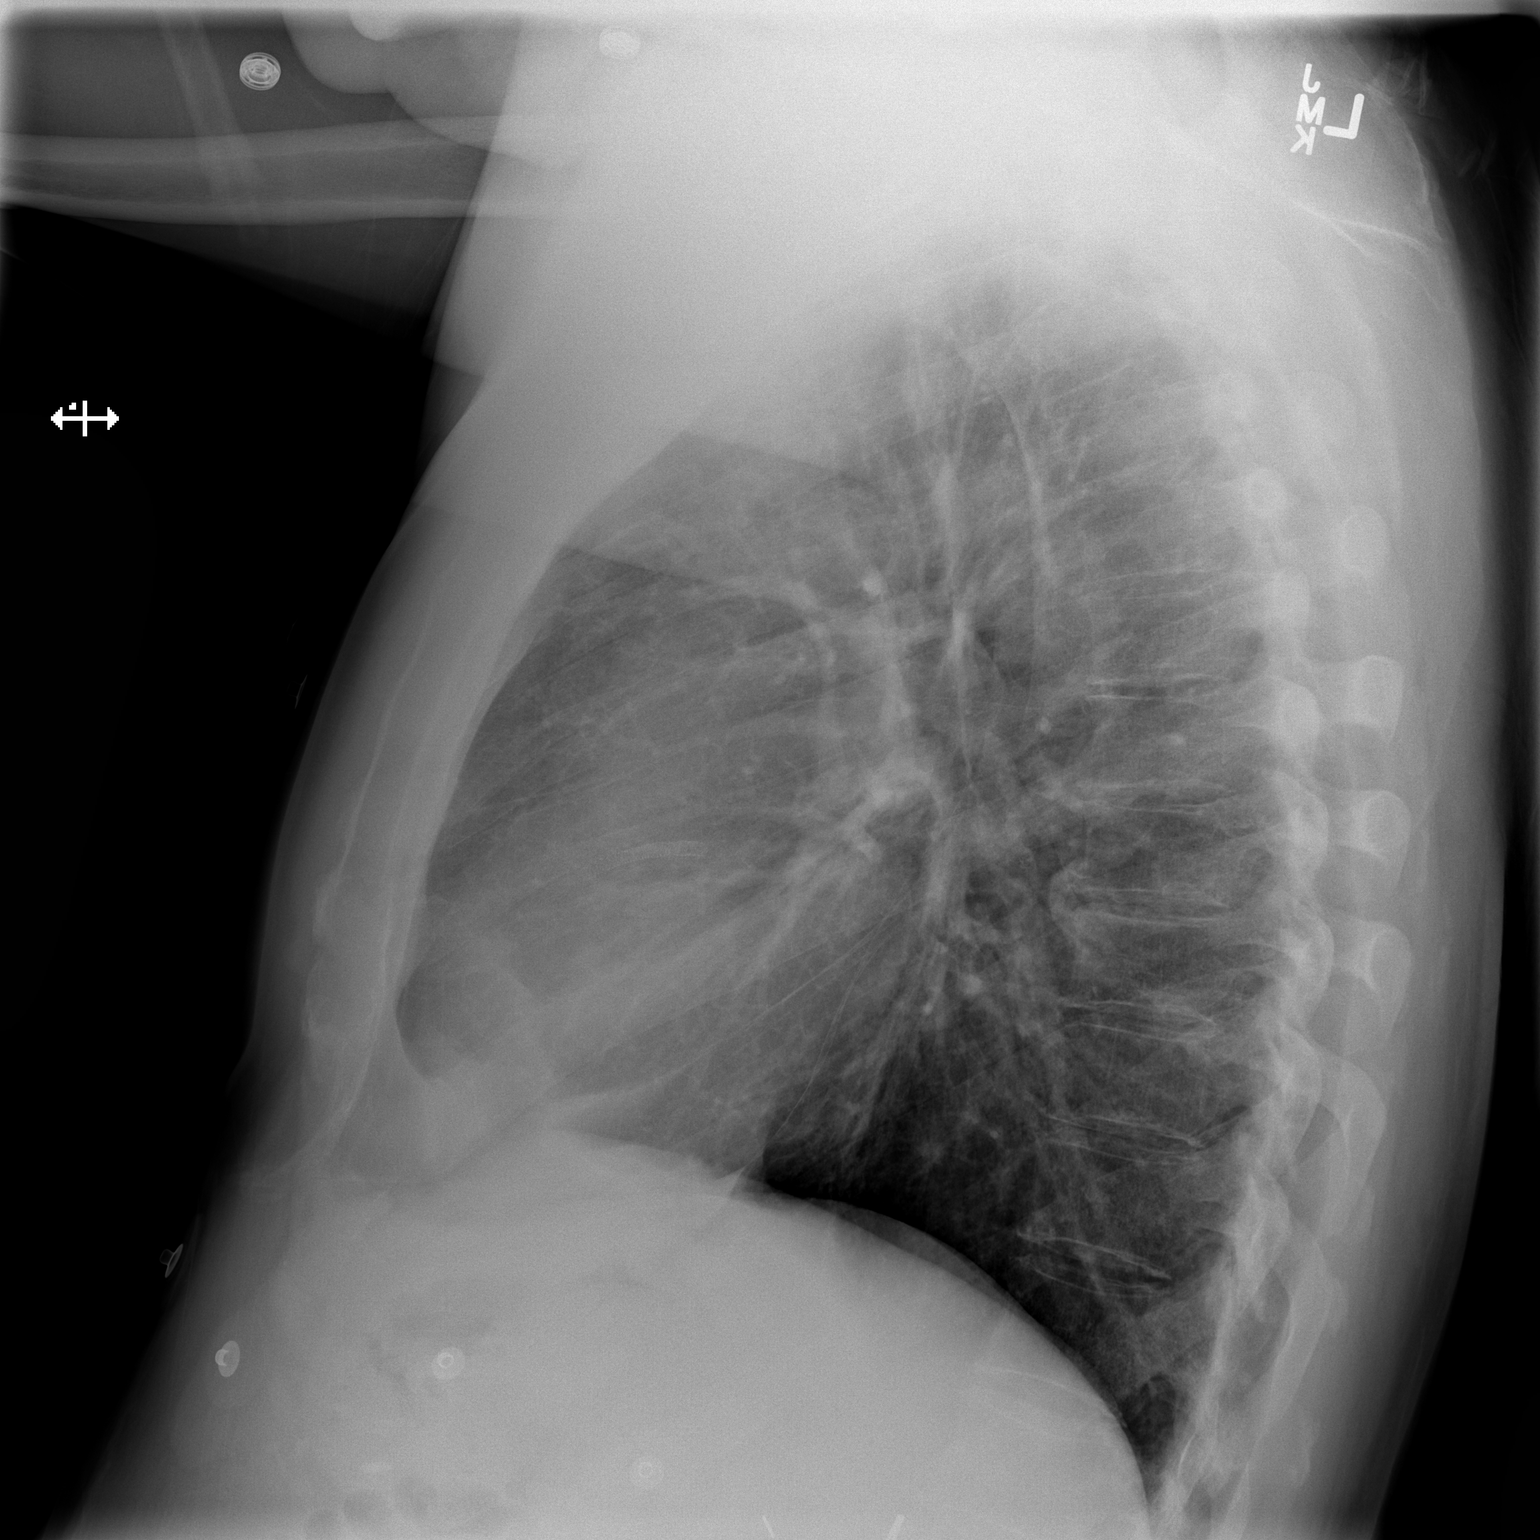

[2 of 2 positions shown; findings below may reference images not displayed]

FINDINGS: The heart size and mediastinal contours are within upper limits of
normal. Both lungs are clear. The visualized skeletal structures are
unremarkable.
IMPRESSION: No active cardiopulmonary disease.

## 2016-01-27 ENCOUNTER — Observation Stay (HOSPITAL_COMMUNITY)
Admission: EM | Admit: 2016-01-27 | Discharge: 2016-01-28 | Disposition: A | Discharge status: Home / Self Care | Payer: Medicare HMO | Attending: Internal Medicine | Admitting: Internal Medicine

## 2016-01-27 ENCOUNTER — Emergency Department (HOSPITAL_COMMUNITY): Payer: Medicare HMO

## 2016-01-27 ENCOUNTER — Encounter (HOSPITAL_COMMUNITY): Payer: Self-pay

## 2016-01-27 ENCOUNTER — Emergency Department (HOSPITAL_BASED_OUTPATIENT_CLINIC_OR_DEPARTMENT_OTHER)
Admit: 2016-01-27 | Discharge: 2016-01-27 | Disposition: A | Discharge status: Home / Self Care | Payer: Medicare HMO | Attending: Emergency Medicine | Admitting: Emergency Medicine

## 2016-01-27 DIAGNOSIS — Z79899 Other long term (current) drug therapy: Secondary | ICD-10-CM | POA: Diagnosis not present

## 2016-01-27 DIAGNOSIS — I252 Old myocardial infarction: Secondary | ICD-10-CM | POA: Insufficient documentation

## 2016-01-27 DIAGNOSIS — I2510 Atherosclerotic heart disease of native coronary artery without angina pectoris: Secondary | ICD-10-CM | POA: Diagnosis not present

## 2016-01-27 DIAGNOSIS — S8002XA Contusion of left knee, initial encounter: Secondary | ICD-10-CM

## 2016-01-27 DIAGNOSIS — Z87891 Personal history of nicotine dependence: Secondary | ICD-10-CM | POA: Insufficient documentation

## 2016-01-27 DIAGNOSIS — M79609 Pain in unspecified limb: Secondary | ICD-10-CM | POA: Diagnosis not present

## 2016-01-27 DIAGNOSIS — R0789 Other chest pain: Secondary | ICD-10-CM | POA: Diagnosis not present

## 2016-01-27 DIAGNOSIS — Z955 Presence of coronary angioplasty implant and graft: Secondary | ICD-10-CM | POA: Insufficient documentation

## 2016-01-27 DIAGNOSIS — R1031 Right lower quadrant pain: Secondary | ICD-10-CM | POA: Diagnosis not present

## 2016-01-27 DIAGNOSIS — I10 Essential (primary) hypertension: Secondary | ICD-10-CM | POA: Diagnosis not present

## 2016-01-27 DIAGNOSIS — R079 Chest pain, unspecified: Secondary | ICD-10-CM | POA: Diagnosis present

## 2016-01-27 DIAGNOSIS — I25118 Atherosclerotic heart disease of native coronary artery with other forms of angina pectoris: Secondary | ICD-10-CM | POA: Diagnosis not present

## 2016-01-27 DIAGNOSIS — M1610 Unilateral primary osteoarthritis, unspecified hip: Secondary | ICD-10-CM

## 2016-01-27 DIAGNOSIS — E7800 Pure hypercholesterolemia, unspecified: Secondary | ICD-10-CM | POA: Diagnosis not present

## 2016-01-27 LAB — CBC
HEMATOCRIT: 38.8 % — AB (ref 39.0–52.0)
HEMOGLOBIN: 13.4 g/dL (ref 13.0–17.0)
MCH: 30 pg (ref 26.0–34.0)
MCHC: 34.5 g/dL (ref 30.0–36.0)
MCV: 87 fL (ref 78.0–100.0)
Platelets: 229 10*3/uL (ref 150–400)
RBC: 4.46 MIL/uL (ref 4.22–5.81)
RDW: 12.7 % (ref 11.5–15.5)
WBC: 11.6 10*3/uL — ABNORMAL HIGH (ref 4.0–10.5)

## 2016-01-27 LAB — BASIC METABOLIC PANEL
ANION GAP: 9 (ref 5–15)
BUN: 12 mg/dL (ref 6–20)
CHLORIDE: 103 mmol/L (ref 101–111)
CO2: 27 mmol/L (ref 22–32)
Calcium: 9.3 mg/dL (ref 8.9–10.3)
Creatinine, Ser: 1.26 mg/dL — ABNORMAL HIGH (ref 0.61–1.24)
GFR calc Af Amer: >60 mL/min (ref >60)
GFR calc non Af Amer: 59 mL/min — ABNORMAL LOW (ref >60)
GLUCOSE: 114 mg/dL — AB (ref 65–99)
POTASSIUM: 3.9 mmol/L (ref 3.5–5.1)
Sodium: 139 mmol/L (ref 135–145)

## 2016-01-27 LAB — TROPONIN I

## 2016-01-27 LAB — I-STAT TROPONIN, ED: Troponin i, poc: 0 ng/mL (ref 0.00–0.08)

## 2016-01-27 MED ORDER — CLONAZEPAM 0.5 MG PO TABS
1.0000 mg | ORAL_TABLET | Freq: Two times a day (BID) | ORAL | Status: DC
  Administered 2016-01-27 – 2016-01-28 (×2): 1 mg via ORAL
  Filled 2016-01-27 (×2): qty 2

## 2016-01-27 MED ORDER — PREGABALIN 75 MG PO CAPS
75.0000 mg | ORAL_CAPSULE | Freq: Two times a day (BID) | ORAL | Status: DC
  Administered 2016-01-27 – 2016-01-28 (×2): 75 mg via ORAL
  Filled 2016-01-27 (×2): qty 1

## 2016-01-27 MED ORDER — BUPROPION HCL 100 MG PO TABS
100.0000 mg | ORAL_TABLET | Freq: Two times a day (BID) | ORAL | Status: DC
  Administered 2016-01-28: 100 mg via ORAL
  Filled 2016-01-27 (×3): qty 1

## 2016-01-27 MED ORDER — ENOXAPARIN SODIUM 40 MG/0.4ML ~~LOC~~ SOLN: 40.0000 mg | SUBCUTANEOUS | Status: DC

## 2016-01-27 MED ORDER — POTASSIUM CHLORIDE CRYS ER 10 MEQ PO TBCR
10.0000 meq | EXTENDED_RELEASE_TABLET | Freq: Every day | ORAL | Status: DC
  Administered 2016-01-27 – 2016-01-28 (×2): 10 meq via ORAL
  Filled 2016-01-27 (×2): qty 1

## 2016-01-27 MED ORDER — PRASUGREL HCL 10 MG PO TABS
10.0000 mg | ORAL_TABLET | Freq: Every day | ORAL | Status: DC
Start: 2016-01-28 — End: 2016-01-28
  Administered 2016-01-28: 10 mg via ORAL
  Filled 2016-01-27: qty 1

## 2016-01-27 MED ORDER — FUROSEMIDE 20 MG PO TABS
20.0000 mg | ORAL_TABLET | Freq: Every day | ORAL | Status: DC
  Administered 2016-01-28: 20 mg via ORAL
  Filled 2016-01-27: qty 1

## 2016-01-27 MED ORDER — NITROGLYCERIN 0.4 MG SL SUBL: 0.4000 mg | SUBLINGUAL_TABLET | SUBLINGUAL | Status: DC | PRN

## 2016-01-27 MED ORDER — FAMOTIDINE 20 MG PO TABS
20.0000 mg | ORAL_TABLET | Freq: Every day | ORAL | Status: DC
Start: 2016-01-28 — End: 2016-01-28
  Administered 2016-01-28: 20 mg via ORAL
  Filled 2016-01-27: qty 1

## 2016-01-27 MED ORDER — ROSUVASTATIN CALCIUM 10 MG PO TABS
10.0000 mg | ORAL_TABLET | Freq: Every day | ORAL | Status: DC
  Administered 2016-01-28: 10 mg via ORAL
  Filled 2016-01-27: qty 1

## 2016-01-27 MED ORDER — ADULT MULTIVITAMIN W/MINERALS CH
1.0000 | ORAL_TABLET | Freq: Every day | ORAL | Status: DC
  Administered 2016-01-27 – 2016-01-28 (×2): 1 via ORAL
  Filled 2016-01-27 (×2): qty 1

## 2016-01-27 MED ORDER — LISINOPRIL 2.5 MG PO TABS
2.5000 mg | ORAL_TABLET | Freq: Two times a day (BID) | ORAL | Status: DC
  Administered 2016-01-27 – 2016-01-28 (×2): 2.5 mg via ORAL
  Filled 2016-01-27 (×2): qty 1

## 2016-01-27 MED ORDER — PAROXETINE HCL 20 MG PO TABS
20.0000 mg | ORAL_TABLET | Freq: Every day | ORAL | Status: DC
  Administered 2016-01-28: 20 mg via ORAL
  Filled 2016-01-27: qty 1

## 2016-01-27 NOTE — ED Triage Notes (Signed)
Pt . Reports having intermittent sharp chest pain for over 3 months.  He become sob and sweaty when the pain comes.  Pt. Will take a nitro and it relieves the pain.  He denies any n/v/d. He also reports having severe pain in his rt. Groin area where the his cath was done in 2014.  He reports that the area is swollen and very painful with movement and touch.   Pt. Also having rt. Flank pain and does have only 1 kidney.  He also has  Bilateral lower feet neuropathy and he stubbed his rt. 4th toe nad it is red, swollen and painful to touch.  ECG was completed in triage.  Skin is p/w/d.

## 2016-01-27 NOTE — ED Provider Notes (Signed)
[facility]-EMERGENCY DEPT Provider Note   CSN: [id number] Arrival date & time: 01/27/16  7829     History   Chief Complaint Chief Complaint  Patient presents with  . Chest Pain    HPI Brendan Butler is a 65 y.o. male.  HPI 65 year old male who presents with chest pain. He has a history of CAD with LAD stent on prasugrel, hypertension, hyperlipidemia. Seen by Dr. Allyson Sabal from cardiology. He presents with multiple complaints. States that has atypical sharp intermittent left chest pain since his stent, which Dr. Allyson Sabal has told him with atypical. He has had increasing episodes of this chest pain over the past 3 weeks, which resolves with nitroglycerin. Normally only has this pain 2-3 times a month, but now having it almost 3 times a week. This typically not associated with activity. No associated shortness of breath, nausea or vomiting, diaphoresis, or abdominal pain. Does note baseline diaphoresis and shortness of breath with activity, that is unchanged. Over the past several days has noted dead that he has had pain behind the right knee into the right groin with mild leg swelling around the thigh. Has not had recent immobilization or travel, prior history of DVT/PE. Also states that he stubbed his right fourth toe a few days ago, and it is now red, swollen, and bruised. Has been able to move it and walk. No other injuries.    Past Medical History:  Diagnosis Date  . Acute myocardial infarction of other anterior wall, initial episode of care 12/29/12   Promus stent to LAD  . Acute ST segment elevation MI (HCC) 01/05/13   secondary to thrombus in stent; Brilintta changed to Effient pt with ASA allergy  . Coronary artery disease 2001   stent to LAD and patent 2007 on cath  . Echocardiogram abnormal 01/06/13   EF 45-50%, grade I diastolic dysfunction  . Erectile dysfunction   . Hyperlipidemia LDL goal <70   . Hypertension   . OSA (obstructive sleep apnea)     Patient Active Problem List   Diagnosis Date Noted  . Chest pain 01/27/2016  . Unstable angina (HCC) 04/21/2013  . Leukocytosis 04/21/2013  . Subsequent ST elevation (STEMI) myocardial infarction of anterior wall within 4 weeks of initial infarction [facility]) 01/08/2013    Class: Hospitalized for  . Illiteracy and low-level literacy 01/08/2013  . Aspirin allergy- hives 01/06/2013  . STEMI 12/29/12 Rx'd with LAD DES with early stent thrombosis, STEMI-PCI 01/05/14 01/05/2013  . CAD S/P STEMI 12/29/12 Mid LAD occlusion treated with DES 06/29/2007  . HYPERLIPIDEMIA 06/25/2007  . HYPERTENSION 06/25/2007  . ALLERGIC RHINITIS 06/25/2007  . SLEEP APNEA- C-pap intol 06/25/2007    Past Surgical History:  Procedure Laterality Date  . BACK SURGERY  2007   Decompression lumbar laminectomy and micro discectomy, L4-5  . CARDIAC CATHETERIZATION  2007   patent stent to LAD and normal Cors  . CORONARY ANGIOPLASTY WITH STENT PLACEMENT  2001   to LAD  . CORONARY ANGIOPLASTY WITH STENT PLACEMENT  12/29/12   STEMI with promus DES to LAD  . CORONARY ANGIOPLASTY WITH STENT PLACEMENT  01/05/13   STEMI with thrombosis in stent to LAD  . LEFT HEART CATH N/A 12/29/2012   Procedure: LEFT HEART CATH;  Surgeon: Corky Crafts, MD;  Location: [facility] C[facility] LAB;  Service: Cardiovascular;  Laterality: N/A;  . LEFT HEART CATHETERIZATION WITH CORONARY ANGIOGRAM N/A 01/05/2013   Procedure: LEFT HEART CATHETERIZATION WITH CORONARY ANGIOGRAM;  Surgeon: Micheline Chapman, MD;  Location:  [facility] CATH LAB;  Service: Cardiovascular;  Laterality: N/A;  . LEFT HEART CATHETERIZATION WITH CORONARY ANGIOGRAM N/A 04/22/2013   Procedure: LEFT HEART CATHETERIZATION WITH CORONARY ANGIOGRAM;  Surgeon: Lennette Bihari, MD;  Location: [facility] CATH LAB;  Service: Cardiovascular;  Laterality: N/A;  . PERCUTANEOUS CORONARY STENT INTERVENTION (PCI-S)  12/29/2012   Procedure: PERCUTANEOUS CORONARY STENT INTERVENTION (PCI-S);  Surgeon: Corky Crafts, MD;  Location: [facility] CATH LAB;  Service:  Cardiovascular;;       Home Medications    Prior to Admission medications   Medication Sig Start Date End Date Taking? Authorizing Provider  buPROPion (WELLBUTRIN) 100 MG tablet Take 100 mg by mouth 2 (two) times daily.   Yes Historical Provider, MD  clonazePAM (KLONOPIN) 1 MG tablet Take 1 mg by mouth 2 (two) times daily. 01/12/16  Yes Historical Provider, MD  furosemide (LASIX) 20 MG tablet Take 1 tablet (20 mg total) by mouth daily. 01/02/13  Yes Brittainy Sherlynn Carbon, PA-C  lisinopril (PRINIVIL,ZESTRIL) 2.5 MG tablet Take 2.5 mg by mouth 2 (two) times daily.   Yes Historical Provider, MD  LYRICA 75 MG capsule Take 75 mg by mouth 2 (two) times daily. 01/18/16  Yes Historical Provider, MD  Multiple Vitamin (MULTIVITAMIN WITH MINERALS) TABS tablet Take 1 tablet by mouth daily.   Yes Historical Provider, MD  nitroGLYCERIN (NITROSTAT) 0.4 MG SL tablet Place 1 tablet (0.4 mg total) under the tongue every 5 (five) minutes x 3 doses as needed for chest pain. NEED OV. 05/09/15  Yes Runell Gess, MD  PARoxetine (PAXIL) 20 MG tablet Take 20 mg by mouth daily. 01/24/16  Yes Historical Provider, MD  potassium chloride (K-DUR,KLOR-CON) 10 MEQ tablet Take 1 tablet by mouth daily. 07/23/13  Yes Historical Provider, MD  prasugrel (EFFIENT) 10 MG TABS tablet Take 1 tablet (10 mg total) by mouth daily. 03/11/13  Yes Runell Gess, MD  ranitidine (ZANTAC) 300 MG tablet Take 300 mg by mouth daily.    Yes Historical Provider, MD  rosuvastatin (CRESTOR) 10 MG tablet Take 10 mg by mouth daily. 12/12/15  Yes Historical Provider, MD  atorvastatin (LIPITOR) 80 MG tablet Take 1 tablet (80 mg total) by mouth daily at 6 PM. Patient not taking: Reported on 01/27/2016 01/08/13   Brittainy Sherlynn Carbon, PA-C    Family History Family History  Problem Relation Age of Onset  . Alzheimer's disease Mother     Social History Social History  Substance Use Topics  . Smoking status: Former Games developer  . Smokeless tobacco:  Current User  . Alcohol use No     Allergies   Levitra [vardenafil]; Aspirin; and Clopidogrel   Review of Systems Review of Systems 10/14 systems reviewed and are negative other than those stated in the HPI   Physical Exam Updated Vital Signs BP 143/65   Pulse (!) 59   Temp 97.9 F (36.6 C) (Oral)   Resp 13   Ht 5\' 10"  (1.778 m)   Wt 224 lb (101.6 kg)   SpO2 100%   BMI 32.14 kg/m   Physical Exam Physical Exam  Nursing note and vitals reviewed. Constitutional: Well developed, well nourished, non-toxic, and in no acute distress Head: Normocephalic and atraumatic.  Mouth/Throat: Oropharynx is clear and moist.  Neck: Normal range of motion. Neck supple.  Cardiovascular: Normal rate and regular rhythm.   Pulmonary/Chest: Effort normal and breath sounds normal.  Abdominal: Soft. There is no tenderness. There is no rebound and no guarding.  Musculoskeletal: Normal range of  motion. Pain behind the right knee w/o signficant swelling and full ROM, right 4th toe w/ bruising and soft tissue swelling Neurological: Alert, no facial droop, fluent speech, moves all extremities symmetrically Skin: Skin is warm and dry.  Psychiatric: Cooperative   ED Treatments / Results  Labs (all labs ordered are listed, but only abnormal results are displayed) Labs Reviewed  BASIC METABOLIC PANEL - Abnormal; Notable for the following:       Result Value   Glucose, Bld 114 (*)    Creatinine, Ser 1.26 (*)    GFR calc non Af Amer 59 (*)    All other components within normal limits  CBC - Abnormal; Notable for the following:    WBC 11.6 (*)    HCT 38.8 (*)    All other components within normal limits  I-STAT TROPOININ, ED    EKG  EKG Interpretation  Date/Time:  Friday January 27 2016 10:05:28 EST Ventricular Rate:  68 PR Interval:  162 QRS Duration: 94 QT Interval:  388 QTC Calculation: 412 R Axis:   38 Text Interpretation:  Normal sinus rhythm Septal infarct , age undetermined  Abnormal ECG similar to last EKG  Confirmed by LIU MD, DANA [phone]) on 01/27/2016 11:04:47 AM       Radiology Dg Chest 2 View  Result Date: 01/27/2016 CLINICAL DATA:  Intermittent episodes of sharp chest pain associated with shortness of breath and diaphoresis. Symptoms are relieved with nitroglycerin. History of previous MI and coronary angioplasty with stent placement. EXAM: CHEST  2 VIEW COMPARISON:  PA and lateral chest x-ray of June 11, 2013 FINDINGS: The lungs are adequately inflated. The interstitial markings are coarse but stable. Nipple shadows are visible bilaterally. The heart and pulmonary vascularity are normal. The mediastinum is normal in width. There is no pleural effusion. The trachea is midline. The bony thorax exhibits no acute abnormality. IMPRESSION: Mild chronic bronchitic-smoking related changes. No pneumonia, CHF, nor other acute cardiopulmonary abnormality. Electronically Signed   By: David  SwazilandJordan M.D.   On: 01/27/2016 11:29   Dg Toe 4th Right  Result Date: 01/27/2016 CLINICAL DATA:  Injury. EXAM: RIGHT FOURTH TOE COMPARISON:  No recent prior. FINDINGS: Fracture with posterior displacement noted of the distal portion of the proximal phalanx of the right fourth digit. Overriding fracture fragments are noted. IMPRESSION: Posteriorly displaced and overriding fracture of the distal aspect of the proximal phalanx of the right fourth digit. Electronically Signed   By: Maisie Fushomas  Register   On: 01/27/2016 13:35    Procedures Procedures (including critical care time)  Medications Ordered in ED Medications - No data to display   Initial Impression / Assessment and Plan / ED Course  I have reviewed the triage vital signs and the nursing notes.  Pertinent labs & imaging results that were available during my care of the patient were reviewed by me and considered in my medical decision making (see chart for details).  Clinical Course     Presenting with chest pain, with  typical and atypical features. His chest pain-free here in the emergency department. High risk for ACS given prior coronary artery disease and other comorbidities. Overall heart score of 4. Troponin negative, EKG w/o acute ischemic changes, CXR visualized and w/o significant cardiopulmonary processes. Ultrasound of the right lower extremity is negative for DVT. His chest pain seems atypical for that of PE and no concerns for dissection at this time. Given that he has high risk for for ACS, discussed with internal medicine team. Admitted  to Dr. Josem Kaufmann first cardiac rule out. Of note he recently stubbed his right foot, and has fracture of the fourth metatarsal. Plan for buddy taping, and have outpatient follow-up at this CPAP.    Final Clinical Impressions(s) / ED Diagnoses   Final diagnoses:  Nonspecific chest pain    New Prescriptions New Prescriptions   No medications on file     Lavera Guise, MD 01/27/16 1616

## 2016-01-27 NOTE — Progress Notes (Signed)
*  PRELIMINARY RESULTS* Vascular Ultrasound Right lower extremity venous duplex  has been completed.  Preliminary findings: No evidence of deep vein thrombosis in the right lower extremity. Right lower extremity in negative for bakers cyst. A complex fluid collection, likely hematoma, is seen on the lateral right knee beneath the palpable lump.  Chauncey Fischer 01/27/2016, 2:55 PM

## 2016-01-27 NOTE — Consult Note (Signed)
Reason for Consult: chest pain with CAD   Referring Physician: Dr. Eppie Gibson  PCP:  Tamsen Roers, MD  Primary Cardiologist:Dr. Modesto Charon is an 65 y.o. male.    Chief Complaint: admitted today with chest pain   HPI: 37 yom with h/o of CAD and h/o MI 2015 s/p stenting, dilated cardiomyopathy w/ HFrEF (45-50%), and HTN who presents with worsening complaint of episodic CP.   He underwent initial DCI to his LAD in 2001. In November 2014 he presented with an anterior ST segment elevation MI and underwent insertion of LAD DES stents. He was discharged on 01/11/2013 was readmitted his later with another study and was taken acutely back to the Cath Lab and on the table developed VF requiring defibrillation. He was found to have acute thrombosis of his stent at the proximal edge the entire stented region was redilated with up to a 3.5 noncompliant balloon. He had done well. Ejection fraction had increased to 40-45%. He was changed from [place] to [place] cath 03/2013.    1. Left main: Angiographically normal vessel trifurcating into the LAD, a ramus intermediate vessel, and a left circumflex coronary artery. 2. LAD: Long area of stented segment commencing proximally just beyond the septal perforating artery and extending into the mid vessel. A large diagonal but vessel was jailed by this and there was 30-40% ostial narrowing. Just beyond the diagonal vessel there was focal 70% in-stent restenosis somewhat eccentric 3. Ramus Intermediate: Angiographically normal 4. Left circumflex: Angiographically normal.  5. Right coronary artery: Large dominant angiographically normal vessel   Following successful percutaneous coronary intervention to the focal in-stent restenosis in the mid LAD stent treated with  A 3.5 x 10 mm Flextome [place] Balloon atherotomy catheter with 5 dilatations up to 3.62 mm, the 70% stenosis was reduced to 0%. There was brisk TIMI-3 flow. The  patient experiences identical chest pain with each balloon inflation.  Other hx of hyperlipidemia and HTN.   Now admitted with increased episodes of chest pain.  Sharp pain.  Last 1-2 min if longer he takes NTG which relieves.  He is having to take more NTG than usual.  He decided it was time to be checked,.  EKG SR with septal infarct.  Troponin neg X 1 Cr. 1.26.   Currently no pain.  Some increase SOB with exertion.    Past Medical History:  Diagnosis Date  . Acute myocardial infarction of other anterior wall, initial episode of care 12/29/12   Promus stent to LAD  . Acute ST segment elevation MI ([place]) 01/05/13   secondary to thrombus in stent; Brilintta changed to Effient pt with ASA allergy  . Coronary artery disease 2001   stent to LAD and patent 2007 on cath  . Echocardiogram abnormal 01/06/13   EF 67-34%, grade I diastolic dysfunction  . Erectile dysfunction   . Hyperlipidemia LDL goal <70   . Hypertension   . OSA (obstructive sleep apnea)     Past Surgical History:  Procedure Laterality Date  . BACK SURGERY  2007   Decompression lumbar laminectomy and micro discectomy, L4-5  . CARDIAC CATHETERIZATION  2007   patent stent to LAD and normal [place]  . CORONARY ANGIOPLASTY WITH STENT PLACEMENT  2001   to LAD  . CORONARY ANGIOPLASTY WITH STENT PLACEMENT  12/29/12   STEMI with promus DES to LAD  . CORONARY ANGIOPLASTY WITH STENT PLACEMENT  01/05/13   STEMI with  thrombosis in stent to LAD  . LEFT HEART CATH N/A 12/29/2012   Procedure: LEFT HEART CATH;  Surgeon: Jettie Booze, MD;  [facility]: [facility] CATH LAB;  Service: Cardiovascular;  Laterality: N/A;  . LEFT HEART CATHETERIZATION WITH CORONARY ANGIOGRAM N/A 01/05/2013   Procedure: LEFT HEART CATHETERIZATION WITH CORONARY ANGIOGRAM;  Surgeon: Blane Ohara, MD;  Location: [facility] CATH LAB;  Service: Cardiovascular;  Laterality: N/A;  . LEFT HEART CATHETERIZATION WITH CORONARY ANGIOGRAM N/A 04/22/2013   Procedure: LEFT HEART  CATHETERIZATION WITH CORONARY ANGIOGRAM;  Surgeon: Troy Sine, MD;  Location: [facility] CATH LAB;  Service: Cardiovascular;  Laterality: N/A;  . PERCUTANEOUS CORONARY STENT INTERVENTION (PCI-S)  12/29/2012   Procedure: PERCUTANEOUS CORONARY STENT INTERVENTION (PCI-S);  Surgeon: Jettie Booze, MD;  Location: [facility] CATH LAB;  Service: Cardiovascular;;    Family History  Problem Relation Age of Onset  . Alzheimer's disease Mother    Social History:  reports that he has quit smoking. He uses smokeless tobacco. He reports that he does not drink alcohol or use drugs.  Allergies:  Allergies  Allergen Reactions  . Levitra [Vardenafil] Other (See Comments)    Blurred vision  . Aspirin Hives  . Clopidogrel Other (See Comments)    Not effective anti platelet med for patient-switched to effient    OUTPATIENT MEDICATIONS: No current facility-administered medications on file prior to encounter.    Current Outpatient Prescriptions on File Prior to Encounter  Medication Sig Dispense Refill  . buPROPion (WELLBUTRIN) 100 MG tablet Take 100 mg by mouth 2 (two) times daily.    . furosemide (LASIX) 20 MG tablet Take 1 tablet (20 mg total) by mouth daily. 30 tablet 5  . lisinopril (PRINIVIL,ZESTRIL) 2.5 MG tablet Take 2.5 mg by mouth 2 (two) times daily.    . Multiple Vitamin (MULTIVITAMIN WITH MINERALS) TABS tablet Take 1 tablet by mouth daily.    . nitroGLYCERIN (NITROSTAT) 0.4 MG SL tablet Place 1 tablet (0.4 mg total) under the tongue every 5 (five) minutes x 3 doses as needed for chest pain. NEED OV. 25 tablet 0  . potassium chloride (K-DUR,KLOR-CON) 10 MEQ tablet Take 1 tablet by mouth daily.    . prasugrel (EFFIENT) 10 MG TABS tablet Take 1 tablet (10 mg total) by mouth daily. 30 tablet 11  . ranitidine (ZANTAC) 300 MG tablet Take 300 mg by mouth daily.     Marland Kitchen atorvastatin (LIPITOR) 80 MG tablet Take 1 tablet (80 mg total) by mouth daily at 6 PM. (Patient not taking: Reported on 01/27/2016) 30 tablet  5     Results for orders placed or performed during the hospital encounter of 01/27/16 (from the past 48 hour(s))  Basic metabolic panel     Status: Abnormal   Collection Time: 01/27/16 11:55 AM  Result Value Ref Range   Sodium 139 135 - 145 mmol/L   Potassium 3.9 3.5 - 5.1 mmol/L   Chloride 103 101 - 111 mmol/L   CO2 27 22 - 32 mmol/L   Glucose, Bld 114 (H) 65 - 99 mg/dL   BUN 12 6 - 20 mg/dL   Creatinine, Ser 1.26 (H) 0.61 - 1.24 mg/dL   Calcium 9.3 8.9 - 10.3 mg/dL   GFR calc non Af Amer 59 (L) >60 mL/min   GFR calc Af Amer >60 >60 mL/min    Comment: (NOTE) The eGFR has been calculated using the CKD EPI equation. This calculation has not been validated in all clinical situations. eGFR's persistently <60 mL/min  signify possible Chronic Kidney Disease.    Anion gap 9 5 - 15  CBC     Status: Abnormal   Collection Time: 01/27/16 11:55 AM  Result Value Ref Range   WBC 11.6 (H) 4.0 - 10.5 K/uL   RBC 4.46 4.22 - 5.81 MIL/uL   Hemoglobin 13.4 13.0 - 17.0 g/dL   HCT 38.8 (L) 39.0 - 52.0 %   MCV 87.0 78.0 - 100.0 fL   MCH 30.0 26.0 - 34.0 pg   MCHC 34.5 30.0 - 36.0 g/dL   RDW 12.7 11.5 - 15.5 %   Platelets 229 150 - 400 K/uL  I-stat troponin, ED     Status: None   Collection Time: 01/27/16 12:10 PM  Result Value Ref Range   Troponin i, poc 0.00 0.00 - 0.08 ng/mL   Comment 3            Comment: Due to the release kinetics of cTnI, a negative result within the first hours of the onset of symptoms does not rule out myocardial infarction with certainty. If myocardial infarction is still suspected, repeat the test at appropriate intervals.    Dg Chest 2 View  Result Date: 01/27/2016 CLINICAL DATA:  Intermittent episodes of sharp chest pain associated with shortness of breath and diaphoresis. Symptoms are relieved with nitroglycerin. History of previous MI and coronary angioplasty with stent placement. EXAM: CHEST  2 VIEW COMPARISON:  PA and lateral chest x-ray of June 11, 2013 FINDINGS: The lungs are adequately inflated. The interstitial markings are coarse but stable. Nipple shadows are visible bilaterally. The heart and pulmonary vascularity are normal. The mediastinum is normal in width. There is no pleural effusion. The trachea is midline. The bony thorax exhibits no acute abnormality. IMPRESSION: Mild chronic bronchitic-smoking related changes. No pneumonia, CHF, nor other acute cardiopulmonary abnormality. Electronically Signed   By: David  Martinique M.D.   On: 01/27/2016 11:29   Dg Toe 4th Right  Result Date: 01/27/2016 CLINICAL DATA:  Injury. EXAM: RIGHT FOURTH TOE COMPARISON:  No recent prior. FINDINGS: Fracture with posterior displacement noted of the distal portion of the proximal phalanx of the right fourth digit. Overriding fracture fragments are noted. IMPRESSION: Posteriorly displaced and overriding fracture of the distal aspect of the proximal phalanx of the right fourth digit. Electronically Signed   By: Marcello Moores  Register   On: 01/27/2016 13:35    ROS: General:no colds or fevers, no weight changes Skin:no rashes or ulcers HEENT:no blurred vision, no congestion CV:see HPI PUL:see HPI GI:no diarrhea constipation or melena, no indigestion GU:no hematuria, no dysuria MS:no joint pain, no claudication Neuro:no syncope, no lightheadedness Endo:no diabetes, no thyroid disease  Blood pressure 119/69, pulse (!) 56, temperature 97.5 F (36.4 C), temperature source Oral, resp. rate 15, height 5' 9.5" (1.765 m), weight 220 lb (99.8 kg), SpO2 98 %.  Wt Readings from Last 3 Encounters:  01/27/16 220 lb (99.8 kg)  08/18/13 219 lb 9.6 oz (99.6 kg)  06/11/13 216 lb (98 kg)    PE: General:Pleasant affect, NAD Skin:Warm and dry, brisk capillary refill HEENT:normocephalic, sclera clear, mucus membranes moist Neck:supple, no JVD, no bruits  Heart:S1S2 RRR without murmur, gallup, rub or click Lungs:clear without rales, rhonchi, or wheezes BOF:BPZW, non tender,  + BS, do not palpate liver spleen or masses Ext:no lower ext edema, 2+ pedal pulses, 2+ radial pulses Neuro:alert and oriented, MAE, follows commands, + facial symmetry  Assessment/Plan Active Problems:   Chest pain   Chest pain needs serial  troponins echo and stress test as outpt unless troponins +    CAD with multiple PCIs to LAD.  Has not seen  Cards. Since 2015.   Hyperlipidemia continue Statin     Cecilie Kicks  Nurse Practitioner Certified [place] Pager [phone] or after 5pm or weekends call [phone] 01/27/2016, 8:29 PM  The patient was seen and examined, and I agree with the history, physical exam, assessment and plan as documented above. Pt with aforementioned h/o CAD and LAD stenting presenting with complaints of right groin pain (apparently present since 2014 cath) and intermittent chest pain, lasting up to a minute at most. Does not occur with exertion. Relieved with one SL nitroglycerin. Denies exertional dyspnea, orthopnea, syncope, and palpitations. ECG shows NSR with old septal infarct. Says "I feel pretty good now". Will check serial troponins. If normal, he can be discharged with outpatient stress test and echocardiogram and follow up with Dr. Gwenlyn Found as symptoms seem fairly atypical.  Kate Sable, MD, [facility]  01/27/2016 8:51 PM

## 2016-01-27 NOTE — H&P (Signed)
Date: 01/27/2016               Patient Name:  Brendan Butler MRN: [id number]  DOB: 1950/05/28 Age / Sex: 65 y.o., male   PCP: Aida Puffer, MD         Medical Service: Internal Medicine Teaching Service         Attending Physician: Dr. Doneen Poisson, MD    First Contact: Dr. Carolynn Comment Pager: [phone]  Second Contact: Dr. Reggie Pile Pager: [phone]       After Hours (After 5p/  First Contact Pager: [phone]  weekends / holidays): Second Contact Pager: [phone]   Chief Complaint: CP, R groin pain  History of Present Illness: [name] is a 65 y.o. male with a h/o of CAD and h/o MI 2015 s/p stenting, dilated cardiomyopathy w/ HFrEF (45-50%), and HTN who presents with worsening complaint of episodic CP.  He notes that he has been having episodic sharp CP in the lower L chest since his LHC in 2015. Recently his symptoms have been worsening and he has experience episodes 2-3/wk. Episodes are self-limited and resolve in ~10 minutes but are also aborted by Nitroglycerin. They occur primarily at rest, not exacerbated by exertion. Pt remains active w/ daily walking and does not endorse DOE or CP w/ activity. He reports that this pain is very different his prior ischemic pain. It does not radiate and is described as a "digging" "sharp" pain maybe 3/10.  Pt also complains of LLE pain which has started ~3 wks ago. He reports that he developed a palpable lump on the lateral aspect of his R knee which is not TTP or painful on ROM. He does note pain at rest when he raises his leg at night in his recliner. No reported trauma or historical injury except remote surgery as a child on that knee. An [place] was completed w/o evidence of baker cyst or DVT, however, an organized fluid collection consistent w/ hematoma was observed.  Around this time, he also reports some R groin pain around the site of his LHC insertion. This pain is mild and cramping and is also only present at rest. He notes  that he has had persistent R LE swelling following his stents and this has not worsened recently.  Meds: No current facility-administered medications for this encounter.    Prescriptions Prior to Admission  Medication Sig Dispense Refill Last Dose  . buPROPion (WELLBUTRIN) 100 MG tablet Take 100 mg by mouth 2 (two) times daily.   01/27/2016 at Unknown time  . clonazePAM (KLONOPIN) 1 MG tablet Take 1 mg by mouth 2 (two) times daily.   01/27/2016 at Unknown time  . furosemide (LASIX) 20 MG tablet Take 1 tablet (20 mg total) by mouth daily. 30 tablet 5 01/27/2016 at Unknown time  . lisinopril (PRINIVIL,ZESTRIL) 2.5 MG tablet Take 2.5 mg by mouth 2 (two) times daily.   01/27/2016 at Unknown time  . LYRICA 75 MG capsule Take 75 mg by mouth 2 (two) times daily.   01/27/2016 at Unknown time  . Multiple Vitamin (MULTIVITAMIN WITH MINERALS) TABS tablet Take 1 tablet by mouth daily.   01/27/2016 at Unknown time  . nitroGLYCERIN (NITROSTAT) 0.4 MG SL tablet Place 1 tablet (0.4 mg total) under the tongue every 5 (five) minutes x 3 doses as needed for chest pain. NEED OV. 25 tablet 0 unk at unk  . PARoxetine (PAXIL) 20 MG tablet Take 20 mg by mouth daily.  01/27/2016 at Unknown time  . potassium chloride (K-DUR,KLOR-CON) 10 MEQ tablet Take 1 tablet by mouth daily.   01/26/2016 at Unknown time  . prasugrel (EFFIENT) 10 MG TABS tablet Take 1 tablet (10 mg total) by mouth daily. 30 tablet 11 01/27/2016 at Unknown time  . ranitidine (ZANTAC) 300 MG tablet Take 300 mg by mouth daily.    01/27/2016 at Unknown time  . rosuvastatin (CRESTOR) 10 MG tablet Take 10 mg by mouth daily.   01/27/2016 at Unknown time  . atorvastatin (LIPITOR) 80 MG tablet Take 1 tablet (80 mg total) by mouth daily at 6 PM. (Patient not taking: Reported on 01/27/2016) 30 tablet 5 Not Taking at Unknown time   Allergies: Allergies as of 01/27/2016 - Review Complete 01/27/2016  Allergen Reaction Noted  . Levitra [vardenafil] Other (See Comments)  01/12/2013  . Aspirin Hives 09/10/2007  . Clopidogrel Other (See Comments) 04/21/2013   Past Medical History:  Diagnosis Date  . Acute myocardial infarction of other anterior wall, initial episode of care 12/29/12   Promus stent to LAD  . Acute ST segment elevation MI ([facility]) 01/05/13   secondary to thrombus in stent; Brilintta changed to Effient pt with ASA allergy  . Coronary artery disease 2001   stent to LAD and patent 2007 on cath  . Echocardiogram abnormal 01/06/13   EF 45-50%, grade I diastolic dysfunction  . Erectile dysfunction   . Hyperlipidemia LDL goal <70   . Hypertension   . OSA (obstructive sleep apnea)     Family History: Family History  Problem Relation Age of Onset  . Alzheimer's disease Mother    Social History: Pt is a former smoker. No current alcohol or drug use.  Review of Systems: A complete ROS was negative except as per HPI. Review of Systems  Constitutional: Negative for chills, fever and weight loss.  Eyes: Negative for blurred vision.  Respiratory: Negative for cough and shortness of breath.   Cardiovascular: Positive for leg swelling (Right). Negative for chest pain, palpitations and orthopnea.  Gastrointestinal: Negative for abdominal pain, constipation, diarrhea, nausea and vomiting.  Genitourinary: Negative for dysuria, frequency and urgency.  Musculoskeletal: Negative for myalgias.  Skin: Negative for rash.  Neurological: Negative for dizziness, tremors and headaches.  Endo/Heme/Allergies: Negative for polydipsia.  Psychiatric/Behavioral: The patient is not nervous/anxious.    Physical Exam: Vitals:   01/27/16 1530 01/27/16 1630 01/27/16 1700 01/27/16 1719  BP: 143/65 121/70 123/58 119/69  Pulse: (!) 59 60 (!) 56   Resp: 13 18 15    Temp:    97.5 F (36.4 C)  TempSrc:    Oral  SpO2: 100% 98% 96% 98%  Weight:    220 lb (99.8 kg)  Height:    5' 9.5" (1.765 m)   Physical Exam  Constitutional: He is oriented to person, place, and  time. He appears well-developed and well-nourished. He is cooperative. No distress.  HENT:  Head: Normocephalic and atraumatic.  Right Ear: Hearing normal.  Left Ear: Hearing normal.  Nose: Nose normal.  Mouth/Throat: Mucous membranes are normal.  Cardiovascular: Normal rate, regular rhythm, S1 normal, S2 normal and intact distal pulses.  Exam reveals no gallop.   No murmur heard. Pulmonary/Chest: Effort normal and breath sounds normal. No respiratory distress. He has no wheezes. He has no rhonchi. He has no rales. He exhibits no tenderness.  Abdominal: Soft. Normal appearance and bowel sounds are normal. He exhibits no ascites. There is no hepatosplenomegaly. There is no tenderness.  Musculoskeletal: Normal  range of motion. He exhibits edema (1+ RLE). He exhibits no tenderness.  Palpable firm nodule on the lateral aspect of the R knee. Not TTP. Full ROM.  Neurological: He is alert and oriented to person, place, and time. He has normal strength.  Skin: Skin is warm, dry and intact. He is not diaphoretic.  Psychiatric: He has a normal mood and affect. His speech is normal and behavior is normal.   Labs: CBC:  Recent Labs Lab 01/27/16 1155  WBC 11.6*  HGB 13.4  HCT 38.8*  MCV 87.0  PLT 229   Basic Metabolic Panel:  Recent Labs Lab 01/27/16 1155  NA 139  K 3.9  CL 103  CO2 27  GLUCOSE 114*  BUN 12  CREATININE 1.26*  CALCIUM 9.3   Cardiac Enzymes:  Recent Labs Lab 01/27/16 1210  TROPIPOC 0.00   CBG: Lab Results  Component Value Date   HGBA1C 5.7 (H) 01/05/2013   EKG: EKG Interpretation  Date/Time:  Friday January 27 2016 10:05:28 EST Ventricular Rate:  68 PR Interval:  162 QRS Duration: 94 QT Interval:  388 QTC Calculation: 412 R Axis:   38 Text Interpretation:  Normal sinus rhythm Septal infarct , age undetermined Abnormal ECG similar to last EKG  Confirmed by LIU MD, DANA [phone]) on 01/27/2016 11:04:47 AM  Imaging: Dg Chest 2 View  Result Date:  01/27/2016 CLINICAL DATA:  Intermittent episodes of sharp chest pain associated with shortness of breath and diaphoresis. Symptoms are relieved with nitroglycerin. History of previous MI and coronary angioplasty with stent placement. EXAM: CHEST  2 VIEW COMPARISON:  PA and lateral chest x-ray of June 11, 2013 FINDINGS: The lungs are adequately inflated. The interstitial markings are coarse but stable. Nipple shadows are visible bilaterally. The heart and pulmonary vascularity are normal. The mediastinum is normal in width. There is no pleural effusion. The trachea is midline. The bony thorax exhibits no acute abnormality. IMPRESSION: Mild chronic bronchitic-smoking related changes. No pneumonia, CHF, nor other acute cardiopulmonary abnormality. Electronically Signed   By: David  [place] M.D.   On: 01/27/2016 11:29   Dg Toe 4th Right  Result Date: 01/27/2016 CLINICAL DATA:  Injury. EXAM: RIGHT FOURTH TOE COMPARISON:  No recent prior. FINDINGS: Fracture with posterior displacement noted of the distal portion of the proximal phalanx of the right fourth digit. Overriding fracture fragments are noted. IMPRESSION: Posteriorly displaced and overriding fracture of the distal aspect of the proximal phalanx of the right fourth digit. Electronically Signed   By: Maisie Fus  Register   On: 01/27/2016 13:35   Assessment & Plan by Problem: Active Problems:   Chest pain  [name] is a 65 y.o. male with CAD, dilated cardiomyopathy w/ HFrEF (45-50%), HTN who present for evaluation of CP.  1) CP: Patient w/ some typical features relieved by nitro, though not exertional. No TTP or reproducible pain. 2-3 episodes per week, gradually worsened recently. Heart Score 4. Compliant w/ home meds, on Effient. Negative trop POC, no ischemic ECG changes. CXR negative, w/o SOB. Wells 0. - Admit to tele - EKG in AM - Continue Effient, Statin - Nitro PRN CP - Cards c/s  3) HFrEF (45-50%): Last echo in 2015 w/ dilated  cariomyopathy and dilated aortic root. Likely HTN and ischemic etiologies. On furosemide 20mg  daily. Chronic unilateral swelling of R leg following LHC, no other signs of hypervolemia on exam. - continue home Lasix  2) R knee and groin pain: Doppler negative for DVT, does show a  hematoma on the lateral aspect of the knee. Not clear where groin pain is coming from. Will need MRI of knee as outpatient. - not requiring pain meds  Toe fracture: fracture of proximal phalynx of R 4th toe. Buddy taped in the ED. Will need outpatient follow-up with ortho.   HTN: resume Lisinopril  HLD: resume home Lipitor   DVT PPx - low molecular weight heparin  Code Status - Full  Consults Placed - Cardiology  Dispo: Admit patient for Observation.  Signed: Carolynn Comment, MD 01/27/2016, 6:54 PM  Pager: [phone]

## 2016-01-28 ENCOUNTER — Other Ambulatory Visit: Payer: Self-pay | Admitting: Cardiology

## 2016-01-28 ENCOUNTER — Observation Stay (HOSPITAL_COMMUNITY): Payer: Medicare HMO

## 2016-01-28 ENCOUNTER — Other Ambulatory Visit (HOSPITAL_COMMUNITY): Payer: Medicare Other

## 2016-01-28 DIAGNOSIS — S8001XA Contusion of right knee, initial encounter: Secondary | ICD-10-CM

## 2016-01-28 DIAGNOSIS — R079 Chest pain, unspecified: Secondary | ICD-10-CM | POA: Diagnosis not present

## 2016-01-28 DIAGNOSIS — I252 Old myocardial infarction: Secondary | ICD-10-CM | POA: Diagnosis not present

## 2016-01-28 DIAGNOSIS — Z955 Presence of coronary angioplasty implant and graft: Secondary | ICD-10-CM | POA: Diagnosis not present

## 2016-01-28 DIAGNOSIS — R0789 Other chest pain: Secondary | ICD-10-CM | POA: Insufficient documentation

## 2016-01-28 DIAGNOSIS — I25118 Atherosclerotic heart disease of native coronary artery with other forms of angina pectoris: Secondary | ICD-10-CM | POA: Diagnosis not present

## 2016-01-28 DIAGNOSIS — I2510 Atherosclerotic heart disease of native coronary artery without angina pectoris: Secondary | ICD-10-CM | POA: Diagnosis not present

## 2016-01-28 DIAGNOSIS — M160 Bilateral primary osteoarthritis of hip: Secondary | ICD-10-CM | POA: Diagnosis not present

## 2016-01-28 DIAGNOSIS — X58XXXA Exposure to other specified factors, initial encounter: Secondary | ICD-10-CM

## 2016-01-28 DIAGNOSIS — E7800 Pure hypercholesterolemia, unspecified: Secondary | ICD-10-CM | POA: Diagnosis not present

## 2016-01-28 DIAGNOSIS — S8002XA Contusion of left knee, initial encounter: Secondary | ICD-10-CM

## 2016-01-28 DIAGNOSIS — I10 Essential (primary) hypertension: Secondary | ICD-10-CM | POA: Diagnosis not present

## 2016-01-28 DIAGNOSIS — M1610 Unilateral primary osteoarthritis, unspecified hip: Secondary | ICD-10-CM

## 2016-01-28 LAB — TROPONIN I

## 2016-01-28 NOTE — H&P (Signed)
Internal Medicine Attending Admission Note Date: 01/28/2016  Patient name: Brendan Butler Medical record number: [id number] Date of birth: 12-04-1950 Age: 65 y.o. Gender: male  I saw and evaluated the patient. I reviewed the resident's note and I agree with the resident's findings and plan as documented in the resident's note.  Chief Complaint(s): Chest pain  History - key components related to admission:  Brendan Butler is a 65 year old man with a history of coronary artery disease requiring percutaneous coronary intervention and complicated by an ischemic cardiomyopathy with a left ventricular ejection fraction of 45-50% who presents complaining of 2 years of chest pain which has become more frequent over the last 2-3 weeks. The chest pain is left-sided and sharp. It is not associated with exertion and resolves spontaneously after 10 minutes or after a sublingual nitroglycerin. There is no associated dyspnea, diaphoresis, or dizziness. He was previously told this is not likely to be cardiac chest pain, but given the increase in frequency over the last 2-3 weeks he decided to be reevaluated and presented to the emergency department where he is admitted to the internal medicine teaching service for further evaluation and care.  Overnight he has had no further episodes of chest pain. He is ruled out for myocardial infarction with serial enzymes and ECGs. Cardiology has assessed the patient and is recommending an outpatient stress test and echocardiogram with follow-up to occur in his primary cardiologist's office.  Physical Exam - key components related to admission:  Gen.: Well-developed, well-nourished, man lying comfortably in bed in no acute distress. Right hip: No tenderness over the sub-trochanteric bursa to palpation. No tenderness in the groin to palpation although there was tenderness with abduction of the right leg. Extremities: Without edema.  Lab results:  Basic Metabolic  Panel:  Recent Labs  01/27/16 1155  NA 139  K 3.9  CL 103  CO2 27  GLUCOSE 114*  BUN 12  CREATININE 1.26*  CALCIUM 9.3   CBC:  Recent Labs  01/27/16 1155  WBC 11.6*  HGB 13.4  HCT 38.8*  MCV 87.0  PLT 229   Cardiac Enzymes:  Recent Labs  01/27/16 2006 01/28/16 0153  TROPONINI <0.03 <0.03   Imaging results:  Dg Chest 2 View  Result Date: 01/27/2016 CLINICAL DATA:  Intermittent episodes of sharp chest pain associated with shortness of breath and diaphoresis. Symptoms are relieved with nitroglycerin. History of previous MI and coronary angioplasty with stent placement. EXAM: CHEST  2 VIEW COMPARISON:  PA and lateral chest x-ray of June 11, 2013 FINDINGS: The lungs are adequately inflated. The interstitial markings are coarse but stable. Nipple shadows are visible bilaterally. The heart and pulmonary vascularity are normal. The mediastinum is normal in width. There is no pleural effusion. The trachea is midline. The bony thorax exhibits no acute abnormality. IMPRESSION: Mild chronic bronchitic-smoking related changes. No pneumonia, CHF, nor other acute cardiopulmonary abnormality. Electronically Signed   By: David  SwazilandJordan M.D.   On: 01/27/2016 11:29   Dg Toe 4th Right  Result Date: 01/27/2016 CLINICAL DATA:  Injury. EXAM: RIGHT FOURTH TOE COMPARISON:  No recent prior. FINDINGS: Fracture with posterior displacement noted of the distal portion of the proximal phalanx of the right fourth digit. Overriding fracture fragments are noted. IMPRESSION: Posteriorly displaced and overriding fracture of the distal aspect of the proximal phalanx of the right fourth digit. Electronically Signed   By: Maisie Fushomas  Register   On: 01/27/2016 13:35   Dg Hip Unilat With Pelvis 2-3 Views Right  Result Date: 01/28/2016 CLINICAL DATA:  Right groin pain. EXAM: DG HIP (WITH OR WITHOUT PELVIS) 2-3V RIGHT COMPARISON:  None. FINDINGS: The mineralization and alignment are normal. There is no evidence of  acute fracture or dislocation. No evidence of femoral head avascular necrosis. There are mild, fairly symmetric degenerative changes of the hips and sacroiliac joints bilaterally. Surgical clips are partially imaged within the left abdomen. IMPRESSION: No acute osseous findings. Degenerative changes of both hips and sacroiliac joints. Electronically Signed   By: Carey Bullocks M.D.   On: 01/28/2016 11:54   I personally reviewed the chest x-ray and there is mild cardiomegaly with no focal infiltrates. Compared to the previous chest x-ray on 06/11/2013 there may be a slight increase in the cardiac silhouette size but is otherwise unchanged.  Other results:  EKG: Normal sinus rhythm at 68 bpm, normal axis, normal intervals, Q waves in V1 and V2, no LVH by voltage, early R wave progression, no ST or T-wave changes. This EKG is consistent with a prior septal infarct. It is unchanged from the previous ECG on 08/18/2013 with the exception of a new Q-wave in V2 which is likely related to lead placement.  Assessment & Plan by Problem:  Brendan Butler is a 65 year old man with a history of coronary artery disease requiring percutaneous coronary intervention and complicated by an ischemic cardiomyopathy with a left ventricular ejection fraction of 45-50% who presents with an increased frequency in atypical chest pain over the last 2-3 weeks compared to his baseline chronic pain that he's had for at least 2 years. He is ruled out for myocardial infarction with serial enzymes and ECGs. The likelihood that this represents ischemia is low and it is felt he is safe for discharge home with an outpatient stress test, echocardiogram, and follow-up with his primary cardiologist.  1) Chest pain: He has ruled out for a myocardial infarction with serial enzymes and ECGs. He is currently asymptomatic. He is stable for discharge as he is felt to be low risk at this time. A stress test has been scheduled as will an echocardiogram  with follow-up in Dr. Hazle Coca office, his primary cardiologist.  2) Disposition: He is stable for discharge home today with follow-up in his primary care provider's office which is alreadyscheduled for 02/14/2016 as well as with Dr. Allyson Sabal once he completes his echocardiogram and stress test.

## 2016-01-28 NOTE — Discharge Summary (Signed)
Name: Brendan Butler MRN: [id number] DOB: [birth year] 65 y.o. PCP: Aida Puffer, MD  Date of Admission: 01/27/2016 10:07 AM Date of Discharge: 01/28/2016 Attending Physician: Doneen Poisson, MD  Discharge Diagnosis: Active Problems:   Chest pain   Traumatic hematoma of left popliteal region   DJD (degenerative joint disease) of pelvis  Discharge Medications:   Medication List    TAKE these medications   atorvastatin 80 MG tablet Commonly known as:  LIPITOR Take 1 tablet (80 mg total) by mouth daily at 6 PM.   buPROPion 100 MG tablet Commonly known as:  WELLBUTRIN Take 100 mg by mouth 2 (two) times daily.   clonazePAM 1 MG tablet Commonly known as:  KLONOPIN Take 1 mg by mouth 2 (two) times daily.   furosemide 20 MG tablet Commonly known as:  LASIX Take 1 tablet (20 mg total) by mouth daily.   lisinopril 2.5 MG tablet Commonly known as:  PRINIVIL,ZESTRIL Take 2.5 mg by mouth 2 (two) times daily.   LYRICA 75 MG capsule Generic drug:  pregabalin Take 75 mg by mouth 2 (two) times daily.   multivitamin with minerals Tabs tablet Take 1 tablet by mouth daily.   nitroGLYCERIN 0.4 MG SL tablet Commonly known as:  NITROSTAT Place 1 tablet (0.4 mg total) under the tongue every 5 (five) minutes x 3 doses as needed for chest pain. NEED OV.   PARoxetine 20 MG tablet Commonly known as:  PAXIL Take 20 mg by mouth daily.   potassium chloride 10 MEQ tablet Commonly known as:  K-DUR,KLOR-CON Take 1 tablet by mouth daily.   prasugrel 10 MG Tabs tablet Commonly known as:  EFFIENT Take 1 tablet (10 mg total) by mouth daily.   ranitidine 300 MG tablet Commonly known as:  ZANTAC Take 300 mg by mouth daily.   rosuvastatin 10 MG tablet Commonly known as:  CRESTOR Take 10 mg by mouth daily.      Disposition and follow-up:   Mr.Joey L Herrman was [facility] in Good condition.  At the hospital follow up visit please address:  1.   CP: outpt stress test and echo recommended by cardiology. Knee pain: assess for worsening pain or signs of infection or malignancy. DJD: consider medical or PT therapy.  Follow-up Appointments: Follow-up Information    Nanetta Batty, MD Follow up.   Specialties:  Cardiology, Radiology Why:  our office will call you with a follow-up appointment with Dr. Allyson Sabal and will arrange and outpatient stress test.  Contact information: [address] [place] [address] [phone]          Hospital Course by problem list: Active Problems:   Chest pain   Traumatic hematoma of left popliteal region   DJD (degenerative joint disease) of pelvis   1) Atypical CP: Patient presented with worsening of chronic left-sided atypical chest pain. He was evaluated for concern of ischemic CAD given his extensive history of multiple stents, MI, and comorbid risk factors. On evaluation his EKG was unchanged from prior, cardiac enzymes were trended and remained negative, and telemetry monitoring was unremarkable overnight. Cardiology was consulted and was not concerned for ischemic pain requiring urgent workup. He recommended outpatient stress test and echocardiogram and follow-up with his primary cardiologist. He was discharged on his home Effient and statin medication regimen.  2) HFrEF (45-50%): Last echo in 2015 w/ dilated cariomyopathy and dilated aortic root. He is on furosemide 20mg  daily. He presents with chronic unilateral swelling  of R leg, no other signs of hypervolemia on exam. We suspect some venous insufficiency given his hx of dependent edema worse at night and better in the morning. He was discharged on his PTA regimen.  3) R knee swelling:In emergency department patient was noted to have unilateral right leg swelling and palpable mass on the lateral aspect of the right knee area he received a lower extremity doppler ultrasound which was negative for DVT. This exam does show a  hematoma on the lateral aspect of the knee. We suspect mild silent traumatic injury resulting in a bleed in patient with current anticoagulation. No signs of infection or malignancy in hx or exam. We recommend continued outpatient monitoring.  4) R groin pain:  on presentation patient was noted to have pain in his groin which is worsened by external rotation and concerning for osteoarthritis. Patient reports mild symptoms w/o claudication or other signs of femoral pseudoaneurysm. There was no tenderness to palpation over the trochanteric bursa. He received a plain film which showed bilateral degenerative joint disease, no other abnormalities.  5) Toe fracture: Patient presented with bruising of the of proximal phalynx of R 4thtoe. He was found to have small fracture on plain film x-ray in the emergency department. We recommend support with "buddy tape." No further follow-up needed unless pain worsens.  Discharge Vitals:   BP (!) 155/73 (BP Location: Left Arm)   Pulse 65   Temp 97.8 F (36.6 C) (Oral)   Resp 15   Ht 5' 9.5" (1.765 m)   Wt 220 lb (99.8 kg)   SpO2 98%   BMI 32.02 kg/m   Pertinent Labs, Studies, and Procedures: As above. Radiology below.  Procedures Performed:  Dg Chest 2 View IMPRESSION: Mild chronic bronchitic-smoking related changes. No pneumonia, CHF, nor other acute cardiopulmonary abnormality.   Dg Toe 4th Right  IMPRESSION: Posteriorly displaced and overriding fracture of the distal aspect of the proximal phalanx of the right fourth digit.   Dg Hip Unilat With Pelvis 2-3 Views Right  IMPRESSION: No acute osseous findings. Degenerative changes of both hips and sacroiliac joints.  Consultations: Treatment Team:  Rounding Lbcardiology, MD  Discharge Instructions: Discharge Instructions    Call MD for:  difficulty breathing, headache or visual disturbances    Complete by:  As directed    Call MD for:  severe uncontrolled pain    Complete by:  As directed     Call MD for:  temperature >100.4    Complete by:  As directed    Diet - low sodium heart healthy    Complete by:  As directed    Diet - low sodium heart healthy    Complete by:  As directed    Discharge instructions    Complete by:  As directed    We do not think your chest pain is related to your heart. The Cardiologists would like you to follow up with Dr. Gery Pray and to have a stress-test scheduled outside the hospital.  We feel that the pain and swelling of your right knee may be related to some minor trauma sustained during your normal activity. Because you are taking a blood thinning medication, Effient, you are at more risk for having bleeding from a minor injury. We feel like this is the best explanation for your symptoms and the findings of the Ultrasound test of your lower leg in the hospital. We would like you to follow up with your primary doctor, so that they can continue to  watch this for any signs of change.  We ordered an x-ray of your right hip to evaluate the groin pain you are having. We feel that osteoarthritis, a degeneration of the joint resulting from normal activity and wear, is the most likely explanation for your pain. You should follow up with your primary doctor regarding this pain, as there may be several medications you can take for this arthritis.  It was a pleasure to participate in your care.  Thanks, Dr. Marinda ElkStrelow   Increase activity slowly    Complete by:  As directed    Increase activity slowly    Complete by:  As directed      Signed: Carolynn CommentBryan Ladonne Sharples, MD 01/28/2016, 12:38 PM   Pager: [phone]

## 2016-01-28 NOTE — Progress Notes (Signed)
Patient Name: Brendan Butler Date of Encounter: 01/28/2016  Primary Cardiologist: [facility] Problem List     Active Problems:   Chest pain     Subjective   Denies chest pain and SOB.  Inpatient Medications    Scheduled Meds: . buPROPion  100 mg Oral BID  . clonazePAM  1 mg Oral BID  . famotidine  20 mg Oral Daily  . furosemide  20 mg Oral Daily  . lisinopril  2.5 mg Oral BID  . multivitamin with minerals  1 tablet Oral Daily  . PARoxetine  20 mg Oral Daily  . potassium chloride  10 mEq Oral Daily  . prasugrel  10 mg Oral Daily  . pregabalin  75 mg Oral BID  . rosuvastatin  10 mg Oral Daily   Continuous Infusions:  PRN Meds: nitroGLYCERIN   Vital Signs    Vitals:   01/27/16 1700 01/27/16 1719 01/27/16 2058 01/28/16 0444  BP: 123/58 119/69 123/64 (!) 155/73  Pulse: (!) 56  (!) 57 65  Resp: 15     Temp:  97.5 F (36.4 C) 98.3 F (36.8 C) 97.8 F (36.6 C)  TempSrc:  Oral Oral Oral  SpO2: 96% 98% 98%   Weight:  220 lb (99.8 kg)  220 lb (99.8 kg)  Height:  5' 9.5" (1.765 m)      Intake/Output Summary (Last 24 hours) at 01/28/16 0828 Last data filed at 01/27/16 2220  Gross per 24 hour  Intake              240 ml  Output              675 ml  Net             -435 ml   Filed Weights   01/27/16 1019 01/27/16 1719 01/28/16 0444  Weight: 224 lb (101.6 kg) 220 lb (99.8 kg) 220 lb (99.8 kg)    Physical Exam    GEN: Well nourished, well developed, in no acute distress.  HEENT: Grossly normal.  Neck: Supple, no JVD, or masses. Cardiac: RRR, no murmurs, rubs, or gallops. No clubbing, cyanosis, edema.   Respiratory:  Respirations regular and unlabored, clear to auscultation bilaterally. GI: Soft, nontender, nondistended MS: no deformity or atrophy. Skin: warm and dry, no rash. Neuro:  Strength and sensation are intact. Psych: AAOx3.  Normal affect.  Labs    CBC  Recent Labs  01/27/16 1155  WBC 11.6*  HGB 13.4  HCT 38.8*  MCV 87.0  PLT  229   Basic Metabolic Panel  Recent Labs  01/27/16 1155  NA 139  K 3.9  CL 103  CO2 27  GLUCOSE 114*  BUN 12  CREATININE 1.26*  CALCIUM 9.3   Liver Function Tests No results for input(s): AST, ALT, ALKPHOS, BILITOT, PROT, ALBUMIN in the last 72 hours. No results for input(s): LIPASE, AMYLASE in the last 72 hours. Cardiac Enzymes  Recent Labs  01/27/16 2006 01/28/16 0153  TROPONINI <0.03 <0.03   BNP Invalid input(s): POCBNP D-Dimer No results for input(s): DDIMER in the last 72 hours. Hemoglobin A1C No results for input(s): HGBA1C in the last 72 hours. Fasting Lipid Panel No results for input(s): CHOL, HDL, LDLCALC, TRIG, CHOLHDL, LDLDIRECT in the last 72 hours. Thyroid Function Tests No results for input(s): TSH, T4TOTAL, T3FREE, THYROIDAB in the last 72 hours.  Invalid input(s): FREET3  Telemetry    Personally Reviewed  ECG     Personally Reviewed  Radiology    Dg Chest 2 View  Result Date: 01/27/2016 CLINICAL DATA:  Intermittent episodes of sharp chest pain associated with shortness of breath and diaphoresis. Symptoms are relieved with nitroglycerin. History of previous MI and coronary angioplasty with stent placement. EXAM: CHEST  2 VIEW COMPARISON:  PA and lateral chest x-ray of June 11, 2013 FINDINGS: The lungs are adequately inflated. The interstitial markings are coarse but stable. Nipple shadows are visible bilaterally. The heart and pulmonary vascularity are normal. The mediastinum is normal in width. There is no pleural effusion. The trachea is midline. The bony thorax exhibits no acute abnormality. IMPRESSION: Mild chronic bronchitic-smoking related changes. No pneumonia, CHF, nor other acute cardiopulmonary abnormality. Electronically Signed   By: David  [place]il[place]Jordan M.D.   On: 01/27/2016 11:29   Dg Toe 4th Right  Result Date: 01/27/2016 CLINICAL DATA:  Injury. EXAM: RIGHT FOURTH TOE COMPARISON:  No recent prior. FINDINGS: Fracture with posterior  displacement noted of the distal portion of the proximal phalanx of the right fourth digit. Overriding fracture fragments are noted. IMPRESSION: Posteriorly displaced and overriding fracture of the distal aspect of the proximal phalanx of the right fourth digit. Electronically Signed   By: Maisie Fushomas  Register   On: 01/27/2016 13:35    Cardiac Studies     Patient Profile       Assessment & Plan   1. Chest pain in context of CAD with multiple PCI's to LAD: Asymptomatic. Atypical presentation for ischemic symptoms. Troponins normal. Can get outpatient stress test and echocardiogram and subsequent follow up with Dr. Allyson SabalBerry.  2. Hyperlipidemia: Continue rosuvastatin.  Dispo: DC to home today.   Signed, Prentice DockerSuresh Koneswaran, MD  01/28/2016, 8:28 AM

## 2016-01-28 NOTE — Progress Notes (Signed)
Subjective: Currently, the patient is comfortable w/o CP ON or this AM. Agreeable to outpt w/u per Cards.  Interval Events: No events on tele. Trop trend negative.  Objective: Vital signs in last 24 hours: Vitals:   01/27/16 1700 01/27/16 1719 01/27/16 2058 01/28/16 0444  BP: 123/58 119/69 123/64 (!) 155/73  Pulse: (!) 56  (!) 57 65  Resp: 15     Temp:  97.5 F (36.4 C) 98.3 F (36.8 C) 97.8 F (36.6 C)  TempSrc:  Oral Oral Oral  SpO2: 96% 98% 98%   Weight:  220 lb (99.8 kg)  220 lb (99.8 kg)  Height:  5' 9.5" (1.765 m)     24-hour weight change: Filed Weights   01/27/16 1019 01/27/16 1719 01/28/16 0444  Weight: 224 lb (101.6 kg) 220 lb (99.8 kg) 220 lb (99.8 kg)   Intake/Output:  12/01 0701 - 12/02 0700 In: 240 [P.O.:240] Out: 675 [Urine:675]    Physical Exam: Physical Exam  Constitutional: No distress.  Cardiovascular: Normal rate, regular rhythm and normal heart sounds.   Pulmonary/Chest: Effort normal and breath sounds normal.  Abdominal: Soft. Bowel sounds are normal. There is no tenderness.  Musculoskeletal: He exhibits no edema.   Labs: CBC:  Recent Labs Lab 01/27/16 1155  WBC 11.6*  HGB 13.4  HCT 38.8*  MCV 87.0  PLT 229   Metabolic Panel:  Recent Labs Lab 01/27/16 1155  NA 139  K 3.9  CL 103  CO2 27  GLUCOSE 114*  BUN 12  CREATININE 1.26*  CALCIUM 9.3   Cardiac Labs:  Recent Labs Lab 01/27/16 1210 01/27/16 2006 01/28/16 0153  TROPIPOC 0.00  --   --   TROPONINI  --  <0.03 <0.03   BG: No results for input(s): GLUCAP in the last 168 hours. Lab Results  Component Value Date   HGBA1C 5.7 (H) 01/05/2013    Medications: Infusions:  Scheduled Medications: . buPROPion  100 mg Oral BID  . clonazePAM  1 mg Oral BID  . famotidine  20 mg Oral Daily  . furosemide  20 mg Oral Daily  . lisinopril  2.5 mg Oral BID  . multivitamin with minerals  1 tablet Oral Daily  . PARoxetine  20 mg Oral Daily  . potassium chloride  10 mEq Oral  Daily  . prasugrel  10 mg Oral Daily  . pregabalin  75 mg Oral BID  . rosuvastatin  10 mg Oral Daily   PRN Medications: nitroGLYCERIN  Assessment/Plan: [name] is a 65 y.o. male with CAD, dilated cardiomyopathy w/ HFrEF (45-50%), HTN who present for evaluation of CP.  1) Atypical CP: EKG unchanged, trops negative, no events on tele. High risk, but atypical presentation for ischemic pain. Currently assymptomatic. Cardiology recs outpt Stress/Echo. - DC w/ outpt w/u  2) HFrEF (45-50%): Last echo in 2015 w/ dilated cariomyopathy and dilated aortic root. Likely HTN and ischemic etiologies. On furosemide 20mg  daily. Chronic unilateral swelling of R leg following LHC, no other signs of hypervolemia on exam. Suspect some venous insufficiency given hx of dependent edema worse at night and better in the morning. Lasix were continued.  3) R knee swelling: Doppler negative for DVT, does show a hematoma on the lateral aspect of the knee. Suspect mild traumatic injury with bleed 2/2 anticoagulation. No signs of infection or malignancy in hx or exam. Continue to follow outpt.  4) R groin pain: Pt flexes hip in classical position to illicit femoral arthritis. Mild symptoms  w/o signs of femoral pseudoaneurysm or other mass. No tenderness to palpation over the trochanteric bursa. Will get plain film for evaluation of suspected DJD and recommend outpt f/u.  5) Toe fracture: Fracture of proximal phalynx of R 4th toe. Buddy taped in the ED. No further follow-up needed unless pain worsens.  HTN: resume Lisinopril  HLD: resume home Lipitor   Length of Stay: 0 day(s) Dispo: Anticipated discharge today.  Carolynn CommentBryan Tiffine Henigan, MD Pager: [phone] (7AM-5PM) 01/28/2016, 11:19 AM

## 2016-01-28 NOTE — Progress Notes (Signed)
Internal Medicine Attending  Date: 01/28/2016  Patient name: Brendan Butler Medical record number: [id number] Date of birth: 1950-10-27 Age: 65 y.o. Gender: male  I saw and evaluated the patient. I reviewed the resident's note by Dr. Marinda Elk and I agree with the resident's findings and plans as documented in his progress note.  Please see my H&P dated 01/28/2016 for the specifics of my evaluation, assessment, and plan from earlier today.

## 2016-01-28 NOTE — Progress Notes (Signed)
Order for stress test placed.

## 2016-01-29 LAB — HEMOGLOBIN A1C
Hgb A1c MFr Bld: 5.6 % (ref 4.8–5.6)
MEAN PLASMA GLUCOSE: 114 mg/dL

## 2016-02-24 ENCOUNTER — Telehealth (HOSPITAL_COMMUNITY): Payer: Self-pay

## 2016-02-24 NOTE — Telephone Encounter (Signed)
Encounter complete. 

## 2016-02-28 ENCOUNTER — Telehealth (HOSPITAL_COMMUNITY): Payer: Self-pay

## 2016-02-28 NOTE — Telephone Encounter (Signed)
Encounter complete. 

## 2016-02-29 ENCOUNTER — Inpatient Hospital Stay (HOSPITAL_COMMUNITY): Admission: RE | Admit: 2016-02-29 | Payer: Medicare HMO | Source: Ambulatory Visit

## 2016-03-08 ENCOUNTER — Ambulatory Visit: Payer: Medicare HMO | Admitting: Cardiology

## 2016-04-04 ENCOUNTER — Ambulatory Visit: Payer: Medicare HMO | Admitting: Cardiology

## 2016-04-04 ENCOUNTER — Inpatient Hospital Stay (HOSPITAL_COMMUNITY): Admission: RE | Admit: 2016-04-04 | Payer: Medicare HMO | Source: Ambulatory Visit

## 2016-04-12 ENCOUNTER — Ambulatory Visit: Payer: Medicare HMO | Admitting: Cardiology

## 2016-09-07 ENCOUNTER — Other Ambulatory Visit: Payer: Self-pay | Admitting: *Deleted

## 2016-09-07 ENCOUNTER — Encounter: Payer: Self-pay | Admitting: Neurology

## 2016-09-07 DIAGNOSIS — G629 Polyneuropathy, unspecified: Secondary | ICD-10-CM

## 2016-10-09 ENCOUNTER — Ambulatory Visit (INDEPENDENT_AMBULATORY_CARE_PROVIDER_SITE_OTHER): Payer: Medicare HMO | Admitting: Neurology

## 2016-10-09 DIAGNOSIS — G629 Polyneuropathy, unspecified: Secondary | ICD-10-CM

## 2016-10-09 NOTE — Procedures (Signed)
[facility]  [address] [place], [address]  [place], [place] [address] Tel: [phone] Fax:  [phone] Test Date:  10/09/2016  Patient: Brendan Butler DOB: [birth year] Physician: Nita Sickle, DO  Sex: Male Height: 5\' 10"  Ref Phys: [name], MD  ID#: [id number] Temp: 34.5C Technician:    Patient Complaints: This is a 66 year-old gentleman with impaired glucose tolerance referred for evaluation of feet paresthesias.  NCV & EMG Findings: Extensive electrodiagnostic testing of the right lower extremity and additional studies of the left shows:  1. Bilateral sural sensory responses are absent. Bilateral superficial peroneal sensory responses are within normal limits. 2. Bilateral tibial and right peroneal (EDB) motor responses are absent. Left peroneal motor response shows reduced amplitude at the extensor digitorum brevis.  Bilateral peroneal motor responses at the tibialis anterior are within normal limits. 3. Right tibial H reflex study is within normal limits. 4. Chronic motor axon loss changes are seen affecting the muscles below the knee, without accompanied active denervation.  Impression: The electrophysiologic findings are most consistent with a distal sensorimotor polyneuropathy, axon loss in type, affecting the lower extremities. Overall, these findings are moderate in degree electrically.   ___________________________ Nita Sickle, DO    Nerve Conduction Studies Anti Sensory Summary Table   Site NR Peak (ms) Norm Peak (ms) P-T Amp (V) Norm P-T Amp  Left Sup Peroneal Anti Sensory (Ant Lat Mall)  34.5C  12 cm    2.9 <4.6 6.3 >3  Right Sup Peroneal Anti Sensory (Ant Lat Mall)  34.5C  12 cm    2.8 <4.6 6.0 >3  Left Sural Anti Sensory (Lat Mall)  34.5C  Calf NR  <4.6  >3  Right Sural Anti Sensory (Lat Mall)  34.5C  Calf NR  <4.6  >3   Motor Summary Table   Site NR Onset (ms) Norm Onset (ms) O-P Amp (mV) Norm O-P Amp Site1 Site2 Delta-0 (ms) Dist  (cm) Vel (m/s) Norm Vel (m/s)  Left Peroneal Motor (Ext Dig Brev)  34.5C  Ankle    3.8 <6.0 2.3 >2.5 B Fib Ankle 8.4 35.0 42 >40  B Fib    12.2  2.3  Poplt B Fib 1.5 9.0 60 >40  Poplt    13.7  2.3         Right Peroneal Motor (Ext Dig Brev)  34.5C  Ankle NR  <6.0  >2.5 B Fib Ankle  0.0  >40  B Fib NR     Poplt B Fib  0.0  >40  Poplt NR            Left Peroneal TA Motor (Tib Ant)  34.5C  Fib Head    2.3 <4.5 5.8 >3 Poplit Fib Head 1.2 9.0 75 >40  Poplit    3.5  5.7         Right Peroneal TA Motor (Tib Ant)  34.5C  Fib Head    2.4 <4.5 5.8 >3 Poplit Fib Head 1.6 8.0 50 >40  Poplit    4.0  5.9         Left Tibial Motor (Abd Hall Brev)  34.5C  Ankle NR  <6.0  >4 Knee Ankle  0.0  >40  Knee NR            Right Tibial Motor (Abd Hall Brev)  34.5C  Ankle NR  <6.0  >4 Knee Ankle  0.0  >40  Knee NR  H Reflex Studies   NR H-Lat (ms) Lat Norm (ms) L-R H-Lat (ms)  Right Tibial (Gastroc)  34.5C     33.20 <35    EMG   Side Muscle Ins Act Fibs Psw Fasc Number Recrt Dur Dur. Amp Amp. Poly Poly. Comment  Right AntTibialis Nml Nml Nml Nml 1- Rapid Some 1+ Some 1+ Some 1+ N/A  Right Gastroc Nml Nml Nml Nml 1- Rapid Few 1+ Few 1+ Few 1+ N/A  Right Flex Dig Long Nml Nml Nml Nml 2- Mod-R Some 1+ Some 1+ Nml Nml N/A  Right RectFemoris Nml Nml Nml Nml Nml Nml Nml Nml Nml Nml Nml Nml N/A  Right GluteusMed Nml Nml Nml Nml Nml Nml Nml Nml Nml Nml Nml Nml N/A  Right BicepsFemS Nml Nml Nml Nml Nml Nml Nml Nml Nml Nml Nml Nml N/A  Left AntTibialis Nml Nml Nml Nml 1- Rapid Some 1+ Some 1+ Nml Nml N/A  Left Gastroc Nml Nml Nml Nml 2- Rapid Few 1+ Few 1+ Nml Nml N/A      Waveforms:

## 2016-10-24 ENCOUNTER — Telehealth: Payer: Self-pay | Admitting: Oncology

## 2016-10-24 NOTE — Telephone Encounter (Signed)
error 

## 2016-10-25 ENCOUNTER — Encounter: Payer: Self-pay | Admitting: Hematology and Oncology

## 2016-10-25 ENCOUNTER — Telehealth: Payer: Self-pay | Admitting: Hematology and Oncology

## 2016-10-25 NOTE — Telephone Encounter (Signed)
Appt has been scheduled for the pt to see Dr.Perlov on 9/12 at 11am. Paula Compton from the referring office will notify the pt. Letter mailed.

## 2016-10-25 NOTE — Telephone Encounter (Signed)
Error duplicate encounter

## 2016-11-07 ENCOUNTER — Telehealth: Payer: Self-pay | Admitting: Hematology and Oncology

## 2016-11-07 ENCOUNTER — Ambulatory Visit (HOSPITAL_BASED_OUTPATIENT_CLINIC_OR_DEPARTMENT_OTHER): Payer: Medicare HMO | Admitting: Hematology and Oncology

## 2016-11-07 ENCOUNTER — Encounter: Payer: Self-pay | Admitting: Hematology and Oncology

## 2016-11-07 ENCOUNTER — Other Ambulatory Visit (HOSPITAL_COMMUNITY)
Admission: RE | Admit: 2016-11-07 | Discharge: 2016-11-07 | Disposition: A | Discharge status: Home / Self Care | Payer: Medicare HMO | Source: Ambulatory Visit | Attending: Hematology and Oncology | Admitting: Hematology and Oncology

## 2016-11-07 ENCOUNTER — Ambulatory Visit (HOSPITAL_BASED_OUTPATIENT_CLINIC_OR_DEPARTMENT_OTHER): Payer: Medicare HMO

## 2016-11-07 VITALS — BP 140/54 | HR 70 | Temp 98.3°F | Resp 20 | Ht 69.0 in | Wt 225.2 lb

## 2016-11-07 DIAGNOSIS — D72820 Lymphocytosis (symptomatic): Secondary | ICD-10-CM

## 2016-11-07 LAB — CBC & DIFF AND RETIC
BASO%: 0.2 % (ref 0.0–2.0)
BASOS ABS: 0 10*3/uL (ref 0.0–0.1)
EOS ABS: 0.2 10*3/uL (ref 0.0–0.5)
EOS%: 1.4 % (ref 0.0–7.0)
HEMATOCRIT: 39.3 % (ref 38.4–49.9)
HEMOGLOBIN: 13.2 g/dL (ref 13.0–17.1)
Immature Retic Fract: 7.8 % (ref 3.00–10.60)
LYMPH%: 29.8 % (ref 14.0–49.0)
MCH: 29.7 pg (ref 27.2–33.4)
MCHC: 33.6 g/dL (ref 32.0–36.0)
MCV: 88.3 fL (ref 79.3–98.0)
MONO#: 1.7 10*3/uL — ABNORMAL HIGH (ref 0.1–0.9)
MONO%: 13.7 % (ref 0.0–14.0)
NEUT#: 6.8 10*3/uL — ABNORMAL HIGH (ref 1.5–6.5)
NEUT%: 54.9 % (ref 39.0–75.0)
PLATELETS: 208 10*3/uL (ref 140–400)
RBC: 4.45 10*6/uL (ref 4.20–5.82)
RDW: 12.7 % (ref 11.0–14.6)
RETIC %: 1.91 % — AB (ref 0.80–1.80)
Retic Ct Abs: 85 10*3/uL (ref 34.80–93.90)
WBC: 12.5 10*3/uL — ABNORMAL HIGH (ref 4.0–10.3)
lymph#: 3.7 10*3/uL — ABNORMAL HIGH (ref 0.9–3.3)

## 2016-11-07 LAB — COMPREHENSIVE METABOLIC PANEL
ALBUMIN: 3.7 g/dL (ref 3.5–5.0)
ALK PHOS: 84 U/L (ref 40–150)
ALT: 49 U/L (ref 0–55)
ANION GAP: 10 meq/L (ref 3–11)
AST: 30 U/L (ref 5–34)
BILIRUBIN TOTAL: 0.7 mg/dL (ref 0.20–1.20)
BUN: 12.6 mg/dL (ref 7.0–26.0)
CALCIUM: 9.1 mg/dL (ref 8.4–10.4)
CO2: 25 mEq/L (ref 22–29)
CREATININE: 1.2 mg/dL (ref 0.7–1.3)
Chloride: 103 mEq/L (ref 98–109)
EGFR: 66 mL/min/{1.73_m2} — ABNORMAL LOW (ref >90)
Glucose: 103 mg/dl (ref 70–140)
Potassium: 4 mEq/L (ref 3.5–5.1)
SODIUM: 139 meq/L (ref 136–145)
TOTAL PROTEIN: 7 g/dL (ref 6.4–8.3)

## 2016-11-07 LAB — LACTATE DEHYDROGENASE: LDH: 162 U/L (ref 125–245)

## 2016-11-07 NOTE — Telephone Encounter (Signed)
Scheduled appt per 9/12 los - Gave patient AVS and calender per los.  

## 2016-11-09 NOTE — Assessment & Plan Note (Signed)
66 year old male referred for our evaluation due to presence of leukocytosis. We'll review of historical trends, leukocytosis go back as far as 2009. White blood cell count elevation has been much more severe in the past and is present. Laboratory demonstrates mild lymphocytosis with 4.6 thousand lymphocytes per microliter and monocytosis without basophilia.  Differential diagnosis for this findings is extremely broad. This includes primary hematological problem such as lymphoproliferative disorders as well as reactive processes such as post viral leukocytosis. Over the entire clinical presentation, presence off for severe sweats is the most concerning feature special in conjunction with early satiety as those support presence of potential splenomegaly due to a lymphoproliferative process.  Plan: --Repeat labs as outlined below --Return to clinic in 1 week to review the findings and to decide on further evaluations necessary.

## 2016-11-09 NOTE — Progress Notes (Signed)
[facility] New Visit:  Assessment: Leukocytosis 66 year old male referred for our evaluation due to presence of leukocytosis. We'll review of historical trends, leukocytosis go back as far as 2009. White blood cell count elevation has been much more severe in the past and is present. Laboratory demonstrates mild lymphocytosis with 4.6 thousand lymphocytes per microliter and monocytosis without basophilia.  Differential diagnosis for this findings is extremely broad. This includes primary hematological problem such as lymphoproliferative disorders as well as reactive processes such as post viral leukocytosis. Over the entire clinical presentation, presence off for severe sweats is the most concerning feature special in conjunction with early satiety as those support presence of potential splenomegaly due to a lymphoproliferative process.  Plan: --Repeat labs as outlined below --Return to clinic in 1 week to review the findings and to decide on further evaluations necessary.  Voice recognition software was used and creation of this note. Despite my best effort at editing the text, some misspelling/errors may have occurred.  Orders Placed This Encounter  Procedures  . CBC & Diff and Retic    Standing Status:   Future    Number of Occurrences:   1    Standing Expiration Date:   11/07/2017  . Lactate dehydrogenase (LDH)    Standing Status:   Future    Number of Occurrences:   1    Standing Expiration Date:   11/07/2017  . Comprehensive metabolic panel    Standing Status:   Future    Number of Occurrences:   1    Standing Expiration Date:   11/07/2017  . Flow Cytometry    Standing Status:   Future    Number of Occurrences:   1    Standing Expiration Date:   11/07/2017    All questions were answered.  . The patient knows to call the clinic with any problems, questions or concerns.  This note was electronically signed.    History of Presenting Illness Brendan Butler is a 66 y.o. male referred to the [facility] for Evaluation of leukocytosis. Please see hematological history below for the details available lab work.  Patient's past medical history is significant for prevention, hyperlipidemia, coronary artery disease status post myocardial infarction 2014 and history of coronary stenting, GERD, restless legs and referral neuropathy, diabetes mellitus type 2. Patient has history of significant cigarette smoking, currently absent. She denies any fevers, chills, night sweats, but he does break and sweats easily with minimal activity. He also reports early satiety without dysphasia, nausea, vomiting, abdominal pain, diarrhea, or constipation. He is most prominent complaints are depressive symptoms that have developed when he is Lyrica and Klonopin were discontinued by his primary care provider. The severity of the symptoms reached level of suicidal ideations until he was started on Wellbutrin which has resolved the most severe of the symptoms. Presently, patient denies any significant depression or suicidal ideations.  Oncological/hematological History: --Labs, 09/21/16: WBC 15.0, Hgb 13.6, Plt 223; --Labs, 10/15/16: WBC 13.3, ANC 7.1, ALC 4.6, Mono 1.3, Eos 0.3, Baso 0.0, Hgb 14.3, Plt 204;  Medical History: Past Medical History:  Diagnosis Date  . Acute myocardial infarction of other anterior wall, initial episode of care 12/29/12   Promus stent to LAD  . Acute ST segment elevation MI ([place]) 01/05/13   secondary to thrombus in stent; Brilintta changed to Effient pt with ASA allergy  . Coronary artery disease 2001   stent to LAD and patent 2007 on cath  . Echocardiogram  abnormal 01/06/13   EF 68-08%, grade I diastolic dysfunction  . Erectile dysfunction   . Hyperlipidemia LDL goal <70   . Hypertension   . OSA (obstructive sleep apnea)    No CPAP use overnight    Surgical History: Past Surgical History:  Procedure Laterality Date  . BACK  SURGERY  2007   Decompression lumbar laminectomy and micro discectomy, L4-5  . CARDIAC CATHETERIZATION  2007   patent stent to LAD and normal Cors  . CORONARY ANGIOPLASTY WITH STENT PLACEMENT  2001   to LAD  . CORONARY ANGIOPLASTY WITH STENT PLACEMENT  12/29/12   STEMI with promus DES to LAD  . CORONARY ANGIOPLASTY WITH STENT PLACEMENT  01/05/13   STEMI with thrombosis in stent to LAD  . LEFT HEART CATH N/A 12/29/2012   Procedure: LEFT HEART CATH;  Surgeon: Jettie Booze, MD;  Location: [facility] CATH LAB;  Service: Cardiovascular;  Laterality: N/A;  . LEFT HEART CATHETERIZATION WITH CORONARY ANGIOGRAM N/A 01/05/2013   Procedure: LEFT HEART CATHETERIZATION WITH CORONARY ANGIOGRAM;  Surgeon: Blane Ohara, MD;  Location: [facility] CATH LAB;  Service: Cardiovascular;  Laterality: N/A;  . LEFT HEART CATHETERIZATION WITH CORONARY ANGIOGRAM N/A 04/22/2013   Procedure: LEFT HEART CATHETERIZATION WITH CORONARY ANGIOGRAM;  Surgeon: Troy Sine, MD;  Location: [facility] CATH LAB;  Service: Cardiovascular;  Laterality: N/A;  . PARTIAL NEPHRECTOMY Left 1975   PATIENT ONLY HAS ONE KIDNEY  . PERCUTANEOUS CORONARY STENT INTERVENTION (PCI-S)  12/29/2012   Procedure: PERCUTANEOUS CORONARY STENT INTERVENTION (PCI-S);  Surgeon: Jettie Booze, MD;  Location: [facility] CATH LAB;  Service: Cardiovascular;;    Family History: Family History  Problem Relation Age of Onset  . Alzheimer's disease Mother     Social History: Social History   Social History  . Marital status: Divorced    Spouse name: N/A  . Number of children: N/A  . Years of education: N/A   Occupational History  . Not on file.   Social History Main Topics  . Smoking status: Former Smoker    Packs/day: 2.00    Years: 25.00    Types: Cigarettes    Quit date: 2001  . Smokeless tobacco: Current User    Types: Chew  . Alcohol use No     Comment: 2001 stopped drinking  . Drug use: No  . Sexual activity: Not on file   Other Topics Concern  .  Not on file   Social History Narrative  . No narrative on file    Allergies: Allergies  Allergen Reactions  . Levitra [Vardenafil] Other (See Comments)    Blurred vision  . Aspirin Hives  . Clopidogrel Other (See Comments)    Not effective anti platelet med for patient-switched to effient  . Lipitor [Atorvastatin] Other (See Comments)    Muscle cramps; patient states he does not take medication at home    Medications:  Current Outpatient Prescriptions  Medication Sig Dispense Refill  . buPROPion (WELLBUTRIN) 100 MG tablet Take 100 mg by mouth 2 (two) times daily.    . furosemide (LASIX) 20 MG tablet Take 1 tablet (20 mg total) by mouth daily. 30 tablet 5  . lisinopril (PRINIVIL,ZESTRIL) 2.5 MG tablet Take 2.5 mg by mouth 2 (two) times daily.    Marland Kitchen LYRICA 75 MG capsule Take 75 mg by mouth 2 (two) times daily.    . Multiple Vitamin (MULTIVITAMIN WITH MINERALS) TABS tablet Take 1 tablet by mouth daily.    . nitroGLYCERIN (NITROSTAT) 0.4 MG  SL tablet Place 1 tablet (0.4 mg total) under the tongue every 5 (five) minutes x 3 doses as needed for chest pain. NEED OV. 25 tablet 0  . PARoxetine (PAXIL) 20 MG tablet Take 20 mg by mouth daily.    . potassium chloride (K-DUR,KLOR-CON) 10 MEQ tablet Take 1 tablet by mouth daily.    . prasugrel (EFFIENT) 10 MG TABS tablet Take 1 tablet (10 mg total) by mouth daily. 30 tablet 11  . ranitidine (ZANTAC) 300 MG tablet Take 300 mg by mouth daily.     . rosuvastatin (CRESTOR) 10 MG tablet Take 10 mg by mouth daily.     No current facility-administered medications for this visit.     Review of Systems: Review of Systems  All other systems reviewed and are negative.    PHYSICAL EXAMINATION Blood pressure (!) 140/54, pulse 70, temperature 98.3 F (36.8 C), temperature source Oral, resp. rate 20, height 5' 9"  (1.753 m), weight 225 lb 3.2 oz (102.2 kg), SpO2 97 %.  ECOG PERFORMANCE STATUS: 1 - Symptomatic but completely ambulatory  Physical Exam   Constitutional: He is oriented to person, place, and time and well-developed, well-nourished, and in no distress. No distress.  HENT:  Head: Normocephalic and atraumatic.  Mouth/Throat: No oropharyngeal exudate.  Eyes: Pupils are equal, round, and reactive to light. Conjunctivae and EOM are normal. No scleral icterus.  Neck: Normal range of motion. Neck supple. No thyromegaly present.  Cardiovascular: Normal rate, regular rhythm and normal heart sounds.   No murmur heard. Pulmonary/Chest: Effort normal and breath sounds normal. No respiratory distress. He has no wheezes.  Abdominal: Soft. Bowel sounds are normal. He exhibits no distension and no mass. There is no tenderness. There is no rebound and no guarding.  Musculoskeletal: Normal range of motion. He exhibits no edema.  Lymphadenopathy:    He has no cervical adenopathy.  Neurological: He is alert and oriented to person, place, and time. He has normal reflexes. No cranial nerve deficit.  Skin: Skin is warm and dry. No rash noted. He is not diaphoretic. No erythema. No pallor.     LABORATORY DATA: I have personally reviewed the data as listed: Appointment on 11/07/2016  Component Date Value Ref Range Status  . WBC 11/07/2016 12.5* 4.0 - 10.3 10e3/uL Final  . NEUT# 11/07/2016 6.8* 1.5 - 6.5 10e3/uL Final  . HGB 11/07/2016 13.2  13.0 - 17.1 g/dL Final  . HCT 11/07/2016 39.3  38.4 - 49.9 % Final  . Platelets 11/07/2016 208  140 - 400 10e3/uL Final  . MCV 11/07/2016 88.3  79.3 - 98.0 fL Final  . MCH 11/07/2016 29.7  27.2 - 33.4 pg Final  . MCHC 11/07/2016 33.6  32.0 - 36.0 g/dL Final  . RBC 11/07/2016 4.45  4.20 - 5.82 10e6/uL Final  . RDW 11/07/2016 12.7  11.0 - 14.6 % Final  . lymph# 11/07/2016 3.7* 0.9 - 3.3 10e3/uL Final  . MONO# 11/07/2016 1.7* 0.1 - 0.9 10e3/uL Final  . Eosinophils Absolute 11/07/2016 0.2  0.0 - 0.5 10e3/uL Final  . Basophils Absolute 11/07/2016 0.0  0.0 - 0.1 10e3/uL Final  . NEUT% 11/07/2016 54.9  39.0 -  75.0 % Final  . LYMPH% 11/07/2016 29.8  14.0 - 49.0 % Final  . MONO% 11/07/2016 13.7  0.0 - 14.0 % Final  . EOS% 11/07/2016 1.4  0.0 - 7.0 % Final  . BASO% 11/07/2016 0.2  0.0 - 2.0 % Final  . Retic % 11/07/2016 1.91* 0.80 -  1.80 % Final  . Retic Ct Abs 11/07/2016 85.00  34.80 - 93.90 10e3/uL Final  . Immature Retic Fract 11/07/2016 7.80  3.00 - 10.60 % Final  . LDH 11/07/2016 162  125 - 245 U/L Final  . Sodium 11/07/2016 139  136 - 145 mEq/L Final  . Potassium 11/07/2016 4.0  3.5 - 5.1 mEq/L Final  . Chloride 11/07/2016 103  98 - 109 mEq/L Final  . CO2 11/07/2016 25  22 - 29 mEq/L Final  . Glucose 11/07/2016 103  70 - 140 mg/dl Final   Glucose reference range is for nonfasting patients. Fasting glucose reference range is 70- 100.  Marland Kitchen BUN 11/07/2016 12.6  7.0 - 26.0 mg/dL Final  . Creatinine 11/07/2016 1.2  0.7 - 1.3 mg/dL Final  . Total Bilirubin 11/07/2016 0.70  0.20 - 1.20 mg/dL Final  . Alkaline Phosphatase 11/07/2016 84  40 - 150 U/L Final  . AST 11/07/2016 30  5 - 34 U/L Final  . ALT 11/07/2016 49  0 - 55 U/L Final  . Total Protein 11/07/2016 7.0  6.4 - 8.3 g/dL Final  . Albumin 11/07/2016 3.7  3.5 - 5.0 g/dL Final  . Calcium 11/07/2016 9.1  8.4 - 10.4 mg/dL Final  . Anion Gap 11/07/2016 10  3 - 11 mEq/L Final  . EGFR 11/07/2016 66* >90 ml/min/1.73 m2 Final   eGFR is calculated using the CKD-EPI Creatinine Equation (2009)         Ardath Sax, MD

## 2016-11-12 LAB — FLOW CYTOMETRY

## 2016-11-14 ENCOUNTER — Ambulatory Visit (HOSPITAL_BASED_OUTPATIENT_CLINIC_OR_DEPARTMENT_OTHER): Payer: Medicare HMO | Admitting: Hematology and Oncology

## 2016-11-14 ENCOUNTER — Encounter: Payer: Self-pay | Admitting: Hematology and Oncology

## 2016-11-14 ENCOUNTER — Telehealth: Payer: Self-pay | Admitting: Hematology and Oncology

## 2016-11-14 VITALS — BP 151/68 | HR 62 | Temp 98.3°F | Resp 20 | Ht 69.0 in | Wt 224.5 lb

## 2016-11-14 DIAGNOSIS — D72820 Lymphocytosis (symptomatic): Secondary | ICD-10-CM

## 2016-11-14 DIAGNOSIS — D72821 Monocytosis (symptomatic): Secondary | ICD-10-CM | POA: Diagnosis not present

## 2016-11-14 NOTE — Telephone Encounter (Signed)
Scheduled appt per 9/19- Gave patient AVS and calender per los.

## 2016-11-18 NOTE — Progress Notes (Signed)
[facility] Follow-up Visit:  Assessment: Leukocytosis 66 y.o. male referred for our evaluation due to presence of leukocytosis. On review of historical trends, leukocytosis go back as far as 2009. White blood cell count elevation has been much more severe in the past than at the present time. Laboratory demonstrates mild lymphocytosis with 4.6 thousand lymphocytes per microliter and monocytosis without basophilia. Repeat blood work obtained last visit demonstrated stable or improved white blood cell count with normal lymphocyte count and persistent elevation of monocytes. Flow cytometry was negative for monoclonal B-cell or T-cell process.  This findings are consistent with reactive leukocytosis, although presence of a myeloproliferative process is not fully excluded this time, absence of abnormalities in the platelets or red blood cells make a meaningful clinical abnormality unlikely. Nevertheless, due to persistent abnormality, would like to continue following the patient to clinic for hematological monitoring.   Plan: --Return to clinic in 3 months for repeat labwork to monitor white blood cell count  Voice recognition software was used and creation of this note. Despite my best effort at editing the text, some misspelling/errors may have occurred.  Orders Placed This Encounter  Procedures  . CBC with Differential    Standing Status:   Future    Standing Expiration Date:   11/14/2017  . Comprehensive metabolic panel    Standing Status:   Future    Standing Expiration Date:   11/14/2017  . Lactate dehydrogenase (LDH)    Standing Status:   Future    Standing Expiration Date:   11/14/2017    All questions were answered.  . The patient knows to call the clinic with any problems, questions or concerns.  This note was electronically signed.    History of Presenting Illness Brendan Butler is a 66 y.o. male followed in the [facility] for evaluation of  leukocytosis. Please see hematological history below for the details available lab work.  Patient's past medical history is significant for prevention, hyperlipidemia, coronary artery disease status post myocardial infarction 2014 and history of coronary stenting, GERD, restless legs and referral neuropathy, diabetes mellitus type 2. Patient has history of significant cigarette smoking, currently absent. She denies any fevers, chills, night sweats, but he does break and sweats easily with minimal activity. He also reports early satiety without dysphasia, nausea, vomiting, abdominal pain, diarrhea, or constipation. He is most prominent complaints are depressive symptoms that have developed when he is Lyrica and Klonopin were discontinued by his primary care provider. The severity of the symptoms reached level of suicidal ideations until he was started on Wellbutrin which has resolved the most severe of the symptoms. Presently, patient denies any significant depression or suicidal ideations.  At the present time, patient denies any new symptoms compared to the last visit in the clinic  Oncological/hematological History: --Labs, 09/21/16: WBC 15.0,                                                                               Hgb 13.6, Plt 223; --Labs, 10/15/16: WBC 13.3, ANC 7.1, ALC 4.6, Mono 1.3, Eos 0.3, Baso 0.0, Hgb 14.3, Plt 204; --Labs, 11/07/16: WBC 12.5, ANC 6.8, ALC 3.7, Mono 1.7, Eos 0.2, Baso  0.0, Hgb 13.2, Plt 208; LDH 162; FlowCyto -- no evidence of monoclonal B or T-cell process.  Medical History: Past Medical History:  Diagnosis Date  . Acute myocardial infarction of other anterior wall, initial episode of care 12/29/12   Promus stent to LAD  . Acute ST segment elevation MI ([place]) 01/05/13   secondary to thrombus in stent; Brilintta changed to Effient pt with ASA allergy  . Coronary artery disease 2001   stent to LAD and patent 2007 on cath  . Echocardiogram abnormal 01/06/13   EF  28-36%, grade I diastolic dysfunction  . Erectile dysfunction   . Hyperlipidemia LDL goal <70   . Hypertension   . OSA (obstructive sleep apnea)    No CPAP use overnight    Surgical History: Past Surgical History:  Procedure Laterality Date  . BACK SURGERY  2007   Decompression lumbar laminectomy and micro discectomy, L4-5  . CARDIAC CATHETERIZATION  2007   patent stent to LAD and normal Cors  . CORONARY ANGIOPLASTY WITH STENT PLACEMENT  2001   to LAD  . CORONARY ANGIOPLASTY WITH STENT PLACEMENT  12/29/12   STEMI with promus DES to LAD  . CORONARY ANGIOPLASTY WITH STENT PLACEMENT  01/05/13   STEMI with thrombosis in stent to LAD  . LEFT HEART CATH N/A 12/29/2012   Procedure: LEFT HEART CATH;  Surgeon: Jettie Booze, MD;  Location: [facility] CATH LAB;  Service: Cardiovascular;  Laterality: N/A;  . LEFT HEART CATHETERIZATION WITH CORONARY ANGIOGRAM N/A 01/05/2013   Procedure: LEFT HEART CATHETERIZATION WITH CORONARY ANGIOGRAM;  Surgeon: Blane Ohara, MD;  Location: [facility] CATH LAB;  Service: Cardiovascular;  Laterality: N/A;  . LEFT HEART CATHETERIZATION WITH CORONARY ANGIOGRAM N/A 04/22/2013   Procedure: LEFT HEART CATHETERIZATION WITH CORONARY ANGIOGRAM;  Surgeon: Troy Sine, MD;  Location: [facility] CATH LAB;  Service: Cardiovascular;  Laterality: N/A;  . PARTIAL NEPHRECTOMY Left 1975   PATIENT ONLY HAS ONE KIDNEY  . PERCUTANEOUS CORONARY STENT INTERVENTION (PCI-S)  12/29/2012   Procedure: PERCUTANEOUS CORONARY STENT INTERVENTION (PCI-S);  Surgeon: Jettie Booze, MD;  Location: [facility] CATH LAB;  Service: Cardiovascular;;    Family History: Family History  Problem Relation Age of Onset  . Alzheimer's disease Mother     Social History: Social History   Social History  . Marital status: Divorced    Spouse name: N/A  . Number of children: N/A  . Years of education: N/A   Occupational History  . Not on file.   Social History Main Topics  . Smoking status: Former Smoker     Packs/day: 2.00    Years: 25.00    Types: Cigarettes    Quit date: 2001  . Smokeless tobacco: Current User    Types: Chew  . Alcohol use No     Comment: 2001 stopped drinking  . Drug use: No  . Sexual activity: Not on file   Other Topics Concern  . Not on file   Social History Narrative  . No narrative on file    Allergies: Allergies  Allergen Reactions  . Levitra [Vardenafil] Other (See Comments)    Blurred vision  . Aspirin Hives  . Clopidogrel Other (See Comments)    Not effective anti platelet med for patient-switched to effient  . Lipitor [Atorvastatin] Other (See Comments)    Muscle cramps; patient states he does not take medication at home    Medications:  Current Outpatient Prescriptions  Medication Sig Dispense Refill  . buPROPion (WELLBUTRIN) 100 MG  tablet Take 100 mg by mouth 2 (two) times daily.    . furosemide (LASIX) 20 MG tablet Take 1 tablet (20 mg total) by mouth daily. 30 tablet 5  . lisinopril (PRINIVIL,ZESTRIL) 2.5 MG tablet Take 2.5 mg by mouth 2 (two) times daily.    Marland Kitchen LYRICA 75 MG capsule Take 75 mg by mouth 2 (two) times daily.    . Multiple Vitamin (MULTIVITAMIN WITH MINERALS) TABS tablet Take 1 tablet by mouth daily.    . nitroGLYCERIN (NITROSTAT) 0.4 MG SL tablet Place 1 tablet (0.4 mg total) under the tongue every 5 (five) minutes x 3 doses as needed for chest pain. NEED OV. 25 tablet 0  . PARoxetine (PAXIL) 20 MG tablet Take 20 mg by mouth daily.    . potassium chloride (K-DUR,KLOR-CON) 10 MEQ tablet Take 1 tablet by mouth daily.    . prasugrel (EFFIENT) 10 MG TABS tablet Take 1 tablet (10 mg total) by mouth daily. 30 tablet 11  . ranitidine (ZANTAC) 300 MG tablet Take 300 mg by mouth daily.     . rosuvastatin (CRESTOR) 10 MG tablet Take 10 mg by mouth daily.     No current facility-administered medications for this visit.     Review of Systems: Review of Systems  All other systems reviewed and are negative.    PHYSICAL  EXAMINATION Blood pressure (!) 151/68, pulse 62, temperature 98.3 F (36.8 C), temperature source Oral, resp. rate 20, height 5' 9"  (1.753 m), weight 224 lb 8 oz (101.8 kg), SpO2 97 %.  ECOG PERFORMANCE STATUS: 1 - Symptomatic but completely ambulatory  Physical Exam  Constitutional: He is oriented to person, place, and time and well-developed, well-nourished, and in no distress. No distress.  HENT:  Head: Normocephalic and atraumatic.  Mouth/Throat: No oropharyngeal exudate.  Eyes: Pupils are equal, round, and reactive to light. Conjunctivae and EOM are normal. No scleral icterus.  Neck: Normal range of motion. Neck supple. No thyromegaly present.  Cardiovascular: Normal rate, regular rhythm and normal heart sounds.   No murmur heard. Pulmonary/Chest: Effort normal and breath sounds normal. No respiratory distress. He has no wheezes.  Abdominal: Soft. Bowel sounds are normal. He exhibits no distension and no mass. There is no tenderness. There is no rebound and no guarding.  Musculoskeletal: Normal range of motion. He exhibits no edema.  Lymphadenopathy:    He has no cervical adenopathy.  Neurological: He is alert and oriented to person, place, and time. He has normal reflexes. No cranial nerve deficit.  Skin: Skin is warm and dry. No rash noted. He is not diaphoretic. No erythema. No pallor.     LABORATORY DATA: I have personally reviewed the data as listed: No visits with results within 1 Week(s) from this visit.  Latest known visit with results is:  Appointment on 11/07/2016  Component Date Value Ref Range Status  . WBC 11/07/2016 12.5* 4.0 - 10.3 10e3/uL Final  . NEUT# 11/07/2016 6.8* 1.5 - 6.5 10e3/uL Final  . HGB 11/07/2016 13.2  13.0 - 17.1 g/dL Final  . HCT 11/07/2016 39.3  38.4 - 49.9 % Final  . Platelets 11/07/2016 208  140 - 400 10e3/uL Final  . MCV 11/07/2016 88.3  79.3 - 98.0 fL Final  . MCH 11/07/2016 29.7  27.2 - 33.4 pg Final  . MCHC 11/07/2016 33.6  32.0 - 36.0  g/dL Final  . RBC 11/07/2016 4.45  4.20 - 5.82 10e6/uL Final  . RDW 11/07/2016 12.7  11.0 - 14.6 % Final  .  lymph# 11/07/2016 3.7* 0.9 - 3.3 10e3/uL Final  . MONO# 11/07/2016 1.7* 0.1 - 0.9 10e3/uL Final  . Eosinophils Absolute 11/07/2016 0.2  0.0 - 0.5 10e3/uL Final  . Basophils Absolute 11/07/2016 0.0  0.0 - 0.1 10e3/uL Final  . NEUT% 11/07/2016 54.9  39.0 - 75.0 % Final  . LYMPH% 11/07/2016 29.8  14.0 - 49.0 % Final  . MONO% 11/07/2016 13.7  0.0 - 14.0 % Final  . EOS% 11/07/2016 1.4  0.0 - 7.0 % Final  . BASO% 11/07/2016 0.2  0.0 - 2.0 % Final  . Retic % 11/07/2016 1.91* 0.80 - 1.80 % Final  . Retic Ct Abs 11/07/2016 85.00  34.80 - 93.90 10e3/uL Final  . Immature Retic Fract 11/07/2016 7.80  3.00 - 10.60 % Final  . LDH 11/07/2016 162  125 - 245 U/L Final  . Sodium 11/07/2016 139  136 - 145 mEq/L Final  . Potassium 11/07/2016 4.0  3.5 - 5.1 mEq/L Final  . Chloride 11/07/2016 103  98 - 109 mEq/L Final  . CO2 11/07/2016 25  22 - 29 mEq/L Final  . Glucose 11/07/2016 103  70 - 140 mg/dl Final   Glucose reference range is for nonfasting patients. Fasting glucose reference range is 70- 100.  Marland Kitchen BUN 11/07/2016 12.6  7.0 - 26.0 mg/dL Final  . Creatinine 11/07/2016 1.2  0.7 - 1.3 mg/dL Final  . Total Bilirubin 11/07/2016 0.70  0.20 - 1.20 mg/dL Final  . Alkaline Phosphatase 11/07/2016 84  40 - 150 U/L Final  . AST 11/07/2016 30  5 - 34 U/L Final  . ALT 11/07/2016 49  0 - 55 U/L Final  . Total Protein 11/07/2016 7.0  6.4 - 8.3 g/dL Final  . Albumin 11/07/2016 3.7  3.5 - 5.0 g/dL Final  . Calcium 11/07/2016 9.1  8.4 - 10.4 mg/dL Final  . Anion Gap 11/07/2016 10  3 - 11 mEq/L Final  . EGFR 11/07/2016 66* >90 ml/min/1.73 m2 Final   eGFR is calculated using the CKD-EPI Creatinine Equation (2009)  . Flow Cytometry 11/07/2016 See Separate Report   Final       Ardath Sax, MD

## 2016-11-18 NOTE — Assessment & Plan Note (Signed)
67 y.o. male referred for our evaluation due to presence of leukocytosis. On review of historical trends, leukocytosis go back as far as 2009. White blood cell count elevation has been much more severe in the past than at the present time. Laboratory demonstrates mild lymphocytosis with 4.6 thousand lymphocytes per microliter and monocytosis without basophilia. Repeat blood work obtained last visit demonstrated stable or improved white blood cell count with normal lymphocyte count and persistent elevation of monocytes. Flow cytometry was negative for monoclonal B-cell or T-cell process.  This findings are consistent with reactive leukocytosis, although presence of a myeloproliferative process is not fully excluded this time, absence of abnormalities in the platelets or red blood cells make a meaningful clinical abnormality unlikely. Nevertheless, due to persistent abnormality, would like to continue following the patient to clinic for hematological monitoring.   Plan: --Return to clinic in 3 months for repeat labwork to monitor white blood cell count

## 2017-01-30 ENCOUNTER — Telehealth: Payer: Self-pay | Admitting: Hematology and Oncology

## 2017-01-30 NOTE — Telephone Encounter (Signed)
Covering for AP moved appt from 1/2

## 2017-02-20 ENCOUNTER — Telehealth: Payer: Self-pay | Admitting: *Deleted

## 2017-02-20 NOTE — Telephone Encounter (Signed)
"  I've been calling trying to reach someone to reschedule someone to reschedule the 02-28-2017 appointment to the next month."  Routing call information to collaborative nurse and provider for review.  Further patient communication through collaborative nurse.

## 2017-02-21 ENCOUNTER — Telehealth: Payer: Self-pay

## 2017-02-21 NOTE — Telephone Encounter (Signed)
Returned patient's phone call in reference to his request to reschedule his 02/28/17 appointment. No answer. Left voice mail message for patient to return my call.

## 2017-02-21 NOTE — Telephone Encounter (Signed)
Attempted to contact patient again about his request to reschedule his appointment. No answer again. Left another voicemail.

## 2017-02-21 NOTE — Telephone Encounter (Signed)
Scheduling request sent at this time to facilitate patient's request to reschedule.

## 2017-02-22 ENCOUNTER — Telehealth: Payer: Self-pay

## 2017-02-22 NOTE — Telephone Encounter (Signed)
Spoke to patient and he stated he will keep his appointment on 02/28/17 with Dr. Gweneth Dimitri.

## 2017-02-27 ENCOUNTER — Other Ambulatory Visit: Payer: Medicare HMO

## 2017-02-27 ENCOUNTER — Ambulatory Visit: Payer: Medicare HMO | Admitting: Hematology and Oncology

## 2017-02-28 ENCOUNTER — Ambulatory Visit: Payer: Medicare HMO | Admitting: Hematology and Oncology

## 2017-02-28 ENCOUNTER — Other Ambulatory Visit: Payer: Medicare HMO

## 2017-03-27 ENCOUNTER — Telehealth: Payer: Self-pay | Admitting: Hematology and Oncology

## 2017-03-27 NOTE — Telephone Encounter (Signed)
Spoke with patient regarding appointment per 1/30 sched msge

## 2017-04-03 ENCOUNTER — Encounter: Payer: Self-pay | Admitting: Hematology and Oncology

## 2017-04-03 ENCOUNTER — Inpatient Hospital Stay: Payer: Medicare HMO | Attending: Hematology and Oncology

## 2017-04-03 ENCOUNTER — Telehealth: Payer: Self-pay | Admitting: Hematology and Oncology

## 2017-04-03 ENCOUNTER — Inpatient Hospital Stay (HOSPITAL_BASED_OUTPATIENT_CLINIC_OR_DEPARTMENT_OTHER): Payer: Medicare HMO | Admitting: Hematology and Oncology

## 2017-04-03 ENCOUNTER — Other Ambulatory Visit: Payer: Self-pay

## 2017-04-03 VITALS — BP 147/68 | HR 70 | Temp 97.8°F | Resp 18 | Ht 69.0 in | Wt 232.1 lb

## 2017-04-03 DIAGNOSIS — Z87891 Personal history of nicotine dependence: Secondary | ICD-10-CM | POA: Insufficient documentation

## 2017-04-03 DIAGNOSIS — E1140 Type 2 diabetes mellitus with diabetic neuropathy, unspecified: Secondary | ICD-10-CM | POA: Diagnosis not present

## 2017-04-03 DIAGNOSIS — D72829 Elevated white blood cell count, unspecified: Secondary | ICD-10-CM | POA: Diagnosis not present

## 2017-04-03 DIAGNOSIS — D72821 Monocytosis (symptomatic): Secondary | ICD-10-CM

## 2017-04-03 DIAGNOSIS — D72820 Lymphocytosis (symptomatic): Secondary | ICD-10-CM

## 2017-04-03 LAB — LACTATE DEHYDROGENASE: LDH: 154 U/L (ref 125–245)

## 2017-04-03 LAB — COMPREHENSIVE METABOLIC PANEL
ALBUMIN: 3.6 g/dL (ref 3.5–5.0)
ALK PHOS: 87 U/L (ref 40–150)
ALT: 52 U/L (ref 0–55)
AST: 41 U/L — AB (ref 5–34)
Anion gap: 9 (ref 3–11)
BILIRUBIN TOTAL: 0.5 mg/dL (ref 0.2–1.2)
BUN: 15 mg/dL (ref 7–26)
CALCIUM: 8.8 mg/dL (ref 8.4–10.4)
CO2: 27 mmol/L (ref 22–29)
CREATININE: 1.15 mg/dL (ref 0.70–1.30)
Chloride: 104 mmol/L (ref 98–109)
GFR calc Af Amer: >60 mL/min (ref >60)
GFR calc non Af Amer: >60 mL/min (ref >60)
GLUCOSE: 101 mg/dL (ref 70–140)
Potassium: 4 mmol/L (ref 3.5–5.1)
Sodium: 140 mmol/L (ref 136–145)
TOTAL PROTEIN: 6.6 g/dL (ref 6.4–8.3)

## 2017-04-03 LAB — CBC WITH DIFFERENTIAL/PLATELET
BASOS PCT: 1 %
Basophils Absolute: 0.1 10*3/uL (ref 0.0–0.1)
Eosinophils Absolute: 0.3 10*3/uL (ref 0.0–0.5)
Eosinophils Relative: 2 %
HEMATOCRIT: 38.7 % (ref 38.4–49.9)
HEMOGLOBIN: 13.2 g/dL (ref 13.0–17.1)
Lymphocytes Relative: 33 %
Lymphs Abs: 3.5 10*3/uL — ABNORMAL HIGH (ref 0.9–3.3)
MCH: 29.4 pg (ref 27.2–33.4)
MCHC: 34.1 g/dL (ref 32.0–36.0)
MCV: 86.1 fL (ref 79.3–98.0)
MONOS PCT: 13 %
Monocytes Absolute: 1.4 10*3/uL — ABNORMAL HIGH (ref 0.1–0.9)
NEUTROS ABS: 5.5 10*3/uL (ref 1.5–6.5)
NEUTROS PCT: 51 %
Platelets: 217 10*3/uL (ref 140–400)
RBC: 4.49 MIL/uL (ref 4.20–5.82)
RDW: 13.2 % (ref 11.0–14.6)
WBC: 10.6 10*3/uL — ABNORMAL HIGH (ref 4.0–10.3)

## 2017-04-03 NOTE — Telephone Encounter (Signed)
Scheduled appt per 2/6 los - Gave patient AVS and calender per los. Lab and f./u in 3 months.

## 2017-04-05 NOTE — Progress Notes (Signed)
[facility] Follow-up Visit:  Assessment: Leukocytosis 67 y.o. male referred for our evaluation due to presence of leukocytosis. On review of historical trends, leukocytosis go back as far as 2009. White blood cell count elevation has been much more severe in the past than at the present time. Laboratory demonstrates mild lymphocytosis with 4.6 thousand lymphocytes per microliter and monocytosis without basophilia. Repeat blood work obtained last visit demonstrated stable or improved white blood cell count with normal lymphocyte count and persistent elevation of monocytes. Flow cytometry was negative for monoclonal B-cell or T-cell process.  There has been no progression of the patient's leukocytosis overall.  Neutrophilia and monocytosis appear to be somewhat better compared to September of last year.  My assessment remains that of reactive leukocytosis, although presence of a myeloproliferative process is not fully excluded this time, absence of abnormalities in the platelets or red blood cells make a meaningful clinical abnormality unlikely. Nevertheless, due to persistent abnormality, would like to continue following the patient to clinic for hematological monitoring.   Plan: --Return to clinic in 3 months for repeat labwork to monitor white blood cell count  Voice recognition software was used and creation of this note. Despite my best effort at editing the text, some misspelling/errors may have occurred.  Orders Placed This Encounter  Procedures  . CBC with Differential (Cancer Center Only)    Standing Status:   Future    Standing Expiration Date:   04/03/2018  . Lactate dehydrogenase (LDH)    Standing Status:   Future    Standing Expiration Date:   04/03/2018  . CMP ([facility] only)    Standing Status:   Future    Standing Expiration Date:   04/03/2018    All questions were answered.  . The patient knows to call the clinic with any problems, questions or  concerns.  This note was electronically signed.    History of Presenting Illness Brendan Butler is a 67 y.o. male followed in the [facility] for evaluation of leukocytosis. Please see hematological history below for the details available lab work.  Patient's past medical history is significant for prevention, hyperlipidemia, coronary artery disease status post myocardial infarction 2014 and history of coronary stenting, GERD, restless legs and referral neuropathy, diabetes mellitus type 2. Patient has history of significant cigarette smoking, currently absent. She denies any fevers, chills, night sweats, but he does break and sweats easily with minimal activity. He also reports early satiety without dysphasia, nausea, vomiting, abdominal pain, diarrhea, or constipation. He is most prominent complaints are depressive symptoms that have developed when he is Lyrica and Klonopin were discontinued by his primary care provider. The severity of the symptoms reached level of suicidal ideations until he was started on Wellbutrin which has resolved the most severe of the symptoms. Presently, patient denies any significant depression or suicidal ideations.  At the present time, patient denies any new symptoms compared to the last visit in the clinic.  In the interim, patient was started on Lyrica for neuropathy in bilateral lower extremities.  Oncological/hematological History: --Labs, 09/21/16: WBC 15.0,  Hgb 13.6, Plt 223; --Labs, 10/15/16: WBC 13.3, ANC 7.1, ALC 4.6, Mono 1.3, Eos 0.3, Baso 0.0, Hgb 14.3, Plt 204; --Labs, 11/07/16: WBC 12.5, ANC 6.8, ALC 3.7, Mono 1.7, Eos 0.2, Baso 0.0, Hgb 13.2, Plt 208; LDH 162; FlowCyto -- no evidence of monoclonal B or T-cell process. --Labs, 04/03/17: WBC 10.6, ANC 5.5, ALC 3.5, Mono 1.4, Eos 0.3, Baso 0.1, Hgb 13.2, Plt 217  Medical History: Past Medical History:  Diagnosis Date   . Acute myocardial infarction of other anterior wall, initial episode of care 12/29/12   Promus stent to LAD  . Acute ST segment elevation MI ([place]) 01/05/13   secondary to thrombus in stent; Brilintta changed to Effient pt with ASA allergy  . Coronary artery disease 2001   stent to LAD and patent 2007 on cath  . Echocardiogram abnormal 01/06/13   EF 75-10%, grade I diastolic dysfunction  . Erectile dysfunction   . Hyperlipidemia LDL goal <70   . Hypertension   . OSA (obstructive sleep apnea)    No CPAP use overnight    Surgical History: Past Surgical History:  Procedure Laterality Date  . BACK SURGERY  2007   Decompression lumbar laminectomy and micro discectomy, L4-5  . CARDIAC CATHETERIZATION  2007   patent stent to LAD and normal Cors  . CORONARY ANGIOPLASTY WITH STENT PLACEMENT  2001   to LAD  . CORONARY ANGIOPLASTY WITH STENT PLACEMENT  12/29/12   STEMI with promus DES to LAD  . CORONARY ANGIOPLASTY WITH STENT PLACEMENT  01/05/13   STEMI with thrombosis in stent to LAD  . LEFT HEART CATH N/A 12/29/2012   Procedure: LEFT HEART CATH;  Surgeon: Jettie Booze, MD;  Location: [facility] CATH LAB;  Service: Cardiovascular;  Laterality: N/A;  . LEFT HEART CATHETERIZATION WITH CORONARY ANGIOGRAM N/A 01/05/2013   Procedure: LEFT HEART CATHETERIZATION WITH CORONARY ANGIOGRAM;  Surgeon: Blane Ohara, MD;  Location: [facility] CATH LAB;  Service: Cardiovascular;  Laterality: N/A;  . LEFT HEART CATHETERIZATION WITH CORONARY ANGIOGRAM N/A 04/22/2013   Procedure: LEFT HEART CATHETERIZATION WITH CORONARY ANGIOGRAM;  Surgeon: Troy Sine, MD;  Location: [facility] CATH LAB;  Service: Cardiovascular;  Laterality: N/A;  . PARTIAL NEPHRECTOMY Left 1975   PATIENT ONLY HAS ONE KIDNEY  . PERCUTANEOUS CORONARY STENT INTERVENTION (PCI-S)  12/29/2012   Procedure: PERCUTANEOUS CORONARY STENT INTERVENTION (PCI-S);  Surgeon: Jettie Booze, MD;  Location: [facility] CATH LAB;  Service: Cardiovascular;;    Family  History: Family History  Problem Relation Age of Onset  . Alzheimer's disease Mother     Social History: Social History   Socioeconomic History  . Marital status: Divorced    Spouse name: Not on file  . Number of children: Not on file  . Years of education: Not on file  . Highest education level: Not on file  Social Needs  . Financial resource strain: Not on file  . Food insecurity - worry: Not on file  . Food insecurity - inability: Not on file  . Transportation needs - medical: Not on file  . Transportation needs - non-medical: Not on file  Occupational History  . Not on file  Tobacco Use  . Smoking status: Former Smoker    Packs/day: 2.00    Years: 25.00    Pack years: 50.00    Types: Cigarettes    Last attempt to quit: 2001    Years since quitting: 18.1  . Smokeless tobacco: Current User    Types: Chew  Substance and  Sexual Activity  . Alcohol use: No    Comment: 2001 stopped drinking  . Drug use: No  . Sexual activity: Not on file  Other Topics Concern  . Not on file  Social History Narrative  . Not on file    Allergies: Allergies  Allergen Reactions  . Levitra [Vardenafil] Other (See Comments)    Blurred vision  . Aspirin Hives  . Clopidogrel Other (See Comments)    Not effective anti platelet med for patient-switched to effient  . Lipitor [Atorvastatin] Other (See Comments)    Muscle cramps; patient states he does not take medication at home    Medications:  Current Outpatient Medications  Medication Sig Dispense Refill  . buPROPion (WELLBUTRIN) 100 MG tablet Take 100 mg by mouth 2 (two) times daily.    . furosemide (LASIX) 20 MG tablet Take 1 tablet (20 mg total) by mouth daily. 30 tablet 5  . lisinopril (PRINIVIL,ZESTRIL) 2.5 MG tablet Take 2.5 mg by mouth 2 (two) times daily.    Marland Kitchen LYRICA 75 MG capsule Take 75 mg by mouth 2 (two) times daily.    . Multiple Vitamin (MULTIVITAMIN WITH MINERALS) TABS tablet Take 1 tablet by mouth daily.    .  nitroGLYCERIN (NITROSTAT) 0.4 MG SL tablet Place 1 tablet (0.4 mg total) under the tongue every 5 (five) minutes x 3 doses as needed for chest pain. NEED OV. 25 tablet 0  . PARoxetine (PAXIL) 20 MG tablet Take 20 mg by mouth daily.    . potassium chloride (K-DUR,KLOR-CON) 10 MEQ tablet Take 1 tablet by mouth daily.    . prasugrel (EFFIENT) 10 MG TABS tablet Take 1 tablet (10 mg total) by mouth daily. 30 tablet 11  . ranitidine (ZANTAC) 300 MG tablet Take 300 mg by mouth daily.     . rosuvastatin (CRESTOR) 10 MG tablet Take 10 mg by mouth daily.     No current facility-administered medications for this visit.     Review of Systems: Review of Systems  All other systems reviewed and are negative.    PHYSICAL EXAMINATION Blood pressure (!) 147/68, pulse 70, temperature 97.8 F (36.6 C), temperature source Oral, resp. rate 18, height _0  (1.753 m), weight 232 lb 1.6 oz (105.3 kg), SpO2 98 %.  ECOG PERFORMANCE STATUS: 1 - Symptomatic but completely ambulatory  Physical Exam  Constitutional: He is oriented to person, place, and time and well-developed, well-nourished, and in no distress. No distress.  HENT:  Head: Normocephalic and atraumatic.  Mouth/Throat: No oropharyngeal exudate.  Eyes: Conjunctivae and EOM are normal. Pupils are equal, round, and reactive to light. No scleral icterus.  Neck: Normal range of motion. Neck supple. No thyromegaly present.  Cardiovascular: Normal rate, regular rhythm and normal heart sounds.  No murmur heard. Pulmonary/Chest: Effort normal and breath sounds normal. No respiratory distress. He has no wheezes.  Abdominal: Soft. Bowel sounds are normal. He exhibits no distension and no mass. There is no tenderness. There is no rebound and no guarding.  Musculoskeletal: Normal range of motion. He exhibits no edema.  Lymphadenopathy:    He has no cervical adenopathy.  Neurological: He is alert and oriented to person, place, and time. He has normal reflexes.  No cranial nerve deficit.  Skin: Skin is warm and dry. No rash noted. He is not diaphoretic. No erythema. No pallor.     LABORATORY DATA: I have personally reviewed the data as listed: Appointment on 04/03/2017  Component Date Value Ref Range Status  .  WBC 04/03/2017 10.6* 4.0 - 10.3 K/uL Final  . RBC 04/03/2017 4.49  4.20 - 5.82 MIL/uL Final  . Hemoglobin 04/03/2017 13.2  13.0 - 17.1 g/dL Final  . HCT 04/03/2017 38.7  38.4 - 49.9 % Final  . MCV 04/03/2017 86.1  79.3 - 98.0 fL Final  . MCH 04/03/2017 29.4  27.2 - 33.4 pg Final  . MCHC 04/03/2017 34.1  32.0 - 36.0 g/dL Final  . RDW 04/03/2017 13.2  11.0 - 14.6 % Final  . Platelets 04/03/2017 217  140 - 400 K/uL Final  . Neutrophils Relative % 04/03/2017 51  % Final  . Neutro Abs 04/03/2017 5.5  1.5 - 6.5 K/uL Final  . Lymphocytes Relative 04/03/2017 33  % Final  . Lymphs Abs 04/03/2017 3.5* 0.9 - 3.3 K/uL Final  . Monocytes Relative 04/03/2017 13  % Final  . Monocytes Absolute 04/03/2017 1.4* 0.1 - 0.9 K/uL Final  . Eosinophils Relative 04/03/2017 2  % Final  . Eosinophils Absolute 04/03/2017 0.3  0.0 - 0.5 K/uL Final  . Basophils Relative 04/03/2017 1  % Final  . Basophils Absolute 04/03/2017 0.1  0.0 - 0.1 K/uL Final   Performed at [facility], [place] [address] [place], [place] [address]  . Sodium 04/03/2017 140  136 - 145 mmol/L Final  . Potassium 04/03/2017 4.0  3.5 - 5.1 mmol/L Final  . Chloride 04/03/2017 104  98 - 109 mmol/L Final  . CO2 04/03/2017 27  22 - 29 mmol/L Final  . Glucose, Bld 04/03/2017 101  70 - 140 mg/dL Final  . BUN 04/03/2017 15  7 - 26 mg/dL Final  . Creatinine, Ser 04/03/2017 1.15  0.70 - 1.30 mg/dL Final  . Calcium 04/03/2017 8.8  8.4 - 10.4 mg/dL Final  . Total Protein 04/03/2017 6.6  6.4 - 8.3 g/dL Final  . Albumin 04/03/2017 3.6  3.5 - 5.0 g/dL Final  . AST 04/03/2017 41* 5 - 34 U/L Final  . ALT 04/03/2017 52  0 - 55 U/L Final  . Alkaline Phosphatase 04/03/2017 87  40 - 150  U/L Final  . Total Bilirubin 04/03/2017 0.5  0.2 - 1.2 mg/dL Final  . GFR calc non Af Amer 04/03/2017 >60  >60 mL/min Final  . GFR calc Af Amer 04/03/2017 >60  >60 mL/min Final   Comment: (NOTE) The eGFR has been calculated using the CKD EPI equation. This calculation has not been validated in all clinical situations. eGFR's persistently <60 mL/min signify possible Chronic Kidney Disease.   Georgiann Hahn gap 04/03/2017 9  3 - 11 Final   Performed at [facility], [place] [address], [place], [place] [address]  . LDH 04/03/2017 154  125 - 245 U/L Final   Performed at [facility], [place] [address], [place], [place] [address]       Ardath Sax, MD

## 2017-04-28 NOTE — Assessment & Plan Note (Signed)
67 y.o. male referred for our evaluation due to presence of leukocytosis. On review of historical trends, leukocytosis go back as far as 2009. White blood cell count elevation has been much more severe in the past than at the present time. Laboratory demonstrates mild lymphocytosis with 4.6 thousand lymphocytes per microliter and monocytosis without basophilia. Repeat blood work obtained last visit demonstrated stable or improved white blood cell count with normal lymphocyte count and persistent elevation of monocytes. Flow cytometry was negative for monoclonal B-cell or T-cell process.  There has been no progression of the patient's leukocytosis overall.  Neutrophilia and monocytosis appear to be somewhat better compared to September of last year.  My assessment remains that of reactive leukocytosis, although presence of a myeloproliferative process is not fully excluded this time, absence of abnormalities in the platelets or red blood cells make a meaningful clinical abnormality unlikely. Nevertheless, due to persistent abnormality, would like to continue following the patient to clinic for hematological monitoring.   Plan: --Return to clinic in 3 months for repeat labwork to monitor white blood cell count

## 2017-07-01 ENCOUNTER — Inpatient Hospital Stay: Payer: Medicare HMO

## 2017-07-01 ENCOUNTER — Inpatient Hospital Stay: Payer: Medicare HMO | Attending: Hematology and Oncology | Admitting: Hematology and Oncology

## 2017-09-18 ENCOUNTER — Telehealth: Payer: Self-pay | Admitting: Hematology and Oncology

## 2017-09-18 NOTE — Telephone Encounter (Signed)
Spoke with patient re f/u appointment with Dr. Pamelia Hoit 8/9 @ 9:30 am. Former Dr. Gweneth Dimitri patient - appointment for f/u requested per Dr. Venita Sheffield office due to patient missed last f/u and would like to be seen prior to 8/12 f/u with Dr. Donette Larry.

## 2017-10-04 ENCOUNTER — Other Ambulatory Visit: Payer: Self-pay | Admitting: Hematology and Oncology

## 2017-10-04 ENCOUNTER — Telehealth: Payer: Self-pay | Admitting: Hematology and Oncology

## 2017-10-04 ENCOUNTER — Inpatient Hospital Stay: Payer: Medicare HMO

## 2017-10-04 ENCOUNTER — Inpatient Hospital Stay: Payer: Medicare HMO | Attending: Hematology and Oncology | Admitting: Hematology and Oncology

## 2017-10-04 DIAGNOSIS — D72821 Monocytosis (symptomatic): Secondary | ICD-10-CM | POA: Diagnosis not present

## 2017-10-04 DIAGNOSIS — D72820 Lymphocytosis (symptomatic): Secondary | ICD-10-CM

## 2017-10-04 DIAGNOSIS — Z79899 Other long term (current) drug therapy: Secondary | ICD-10-CM | POA: Insufficient documentation

## 2017-10-04 DIAGNOSIS — D72829 Elevated white blood cell count, unspecified: Secondary | ICD-10-CM | POA: Diagnosis present

## 2017-10-04 NOTE — Assessment & Plan Note (Addendum)
Leukocytosis since 2009 with primarily lymphocytosis with an ANC of 4600 and monocytosis Normal red cell and platelet counts.  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 I would like to obtain a flow cytometry for further evaluation.  Return to clinic once a year for follow-up

## 2017-10-04 NOTE — Progress Notes (Signed)
Patient Care Team: [name], MD as PCP - General (Internal Medicine)  DIAGNOSIS:  Encounter Diagnosis  Name Primary?  . Lymphocytosis     CHIEF COMPLIANT: Follow-up of lymphocytosis, transfer of care from Dr. Gweneth Dimitri  INTERVAL HISTORY: Brendan Butler is a 67 year old with above-mentioned history of lymphocytosis for the past 10 years.  He was seen by Dr. Gweneth Dimitri who recommended watchful monitoring.  He is here today to discuss his condition.  He had not had lab work done today.  He complains of lack of sleep.  Other than that he denies any fevers chills or night sweats or weight loss.  Denies any lymphadenopathy.  REVIEW OF SYSTEMS:   Constitutional: Denies fevers, chills or abnormal weight loss Eyes: Denies blurriness of vision Ears, nose, mouth, throat, and face: Denies mucositis or sore throat Respiratory: Denies cough, dyspnea or wheezes Cardiovascular: Denies palpitation, chest discomfort Gastrointestinal:  Denies nausea, heartburn or change in bowel habits Skin: Denies abnormal skin rashes Lymphatics: Denies new lymphadenopathy or easy bruising Neurological:Denies numbness, tingling or new weaknesses Behavioral/Psych: Mood is stable, no new changes  Extremities: No lower extremity edema   All other systems were reviewed with the patient and are negative.  I have reviewed the past medical history, past surgical history, social history and family history with the patient and they are unchanged from previous note.  ALLERGIES:  is allergic to levitra [vardenafil]; aspirin; clopidogrel; and lipitor [atorvastatin].  MEDICATIONS:  Current Outpatient Medications  Medication Sig Dispense Refill  . buPROPion (WELLBUTRIN) 100 MG tablet Take 100 mg by mouth 2 (two) times daily.    . furosemide (LASIX) 20 MG tablet Take 1 tablet (20 mg total) by mouth daily. 30 tablet 5  . lisinopril (PRINIVIL,ZESTRIL) 2.5 MG tablet Take 2.5 mg by mouth 2 (two) times daily.    Marland Kitchen LYRICA 75 MG  capsule Take 75 mg by mouth 2 (two) times daily.    . Multiple Vitamin (MULTIVITAMIN WITH MINERALS) TABS tablet Take 1 tablet by mouth daily.    . nitroGLYCERIN (NITROSTAT) 0.4 MG SL tablet Place 1 tablet (0.4 mg total) under the tongue every 5 (five) minutes x 3 doses as needed for chest pain. NEED OV. 25 tablet 0  . PARoxetine (PAXIL) 20 MG tablet Take 20 mg by mouth daily.    . potassium chloride (K-DUR,KLOR-CON) 10 MEQ tablet Take 1 tablet by mouth daily.    . prasugrel (EFFIENT) 10 MG TABS tablet Take 1 tablet (10 mg total) by mouth daily. 30 tablet 11  . ranitidine (ZANTAC) 300 MG tablet Take 300 mg by mouth daily.     . rosuvastatin (CRESTOR) 10 MG tablet Take 10 mg by mouth daily.     No current facility-administered medications for this visit.     PHYSICAL EXAMINATION: ECOG PERFORMANCE STATUS: 1 - Symptomatic but completely ambulatory  Vitals:   10/04/17 0935  BP: (!) 162/70  Pulse: (!) 58  Resp: 17  Temp: 97.9 F (36.6 C)  SpO2: 98%   Filed Weights   10/04/17 0935  Weight: 233 lb 8 oz (105.9 kg)    GENERAL:alert, no distress and comfortable SKIN: skin color, texture, turgor are normal, no rashes or significant lesions EYES: normal, Conjunctiva are pink and non-injected, sclera clear OROPHARYNX:no exudate, no erythema and lips, buccal mucosa, and tongue normal  NECK: supple, thyroid normal size, non-tender, without nodularity LYMPH:  no palpable lymphadenopathy in the cervical, axillary or inguinal LUNGS: clear to auscultation and percussion with normal breathing effort  HEART: regular rate & rhythm and no murmurs and no lower extremity edema ABDOMEN:abdomen soft, non-tender and normal bowel sounds MUSCULOSKELETAL:no cyanosis of digits and no clubbing  NEURO: alert & oriented x 3 with fluent speech, no focal motor/sensory deficits EXTREMITIES: No lower extremity edema   LABORATORY DATA:  I have reviewed the data as listed CMP Latest Ref Rng & Units 04/03/2017  11/07/2016 01/27/2016  Glucose 70 - 140 mg/dL 161 096 045(W)  BUN 7 - 26 mg/dL 15 09.8 12  Creatinine 0.70 - 1.30 mg/dL 1.19 1.2 1.47(W)  Sodium 136 - 145 mmol/L 140 139 139  Potassium 3.5 - 5.1 mmol/L 4.0 4.0 3.9  Chloride 98 - 109 mmol/L 104 - 103  CO2 22 - 29 mmol/L 27 25 27   Calcium 8.4 - 10.4 mg/dL 8.8 9.1 9.3  Total Protein 6.4 - 8.3 g/dL 6.6 7.0 -  Total Bilirubin 0.2 - 1.2 mg/dL 0.5 2.95 -  Alkaline Phos 40 - 150 U/L 87 84 -  AST 5 - 34 U/L 41(H) 30 -  ALT 0 - 55 U/L 52 49 -    Lab Results  Component Value Date   WBC 10.6 (H) 04/03/2017   HGB 13.2 04/03/2017   HCT 38.7 04/03/2017   MCV 86.1 04/03/2017   PLT 217 04/03/2017   NEUTROABS 5.5 04/03/2017    ASSESSMENT & PLAN:  Leukocytosis Leukocytosis since 2009 with primarily lymphocytosis with an ANC of 4600 and monocytosis Normal red cell and platelet counts.  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 I would like to obtain a flow cytometry for further evaluation.  Return to clinic once a year for follow-up    No orders of the defined types were placed in this encounter.  The patient has a good understanding of the overall plan. he agrees with it. he will call with any problems that may develop before the next visit here.   Tamsen Meek, MD 10/04/17

## 2017-10-04 NOTE — Telephone Encounter (Signed)
Gave patient avs and calendar.   °

## 2017-10-09 LAB — FLOW CYTOMETRY

## 2018-03-28 ENCOUNTER — Ambulatory Visit: Payer: Medicare HMO | Admitting: Cardiovascular Disease

## 2018-07-25 DIAGNOSIS — I2510 Atherosclerotic heart disease of native coronary artery without angina pectoris: Secondary | ICD-10-CM | POA: Diagnosis not present

## 2018-07-25 DIAGNOSIS — F324 Major depressive disorder, single episode, in partial remission: Secondary | ICD-10-CM | POA: Diagnosis not present

## 2018-07-25 DIAGNOSIS — I10 Essential (primary) hypertension: Secondary | ICD-10-CM | POA: Diagnosis not present

## 2018-09-29 NOTE — Assessment & Plan Note (Deleted)
Leukocytosis since 2009 with primarily lymphocytosis with an [place] of 4600 and monocytosis Normal red cell and platelet counts. Etiology: Reactive lymphocytosis  Labs 04/03/2017: WBC 10.6, absolute lymphocyte count 3.5, absolute monocyte count 1.4 Flow cytometry: Negative for monoclonal B-cell population or T-cell aberrant population. Lab review 10/06/2018:  Return to clinic in 1 year for follow-up

## 2018-10-06 ENCOUNTER — Ambulatory Visit: Payer: Medicare HMO | Admitting: Hematology and Oncology

## 2018-10-06 ENCOUNTER — Other Ambulatory Visit: Payer: Medicare HMO

## 2018-10-14 DIAGNOSIS — I10 Essential (primary) hypertension: Secondary | ICD-10-CM | POA: Diagnosis not present

## 2018-10-14 DIAGNOSIS — G629 Polyneuropathy, unspecified: Secondary | ICD-10-CM | POA: Diagnosis not present

## 2018-10-14 DIAGNOSIS — Z125 Encounter for screening for malignant neoplasm of prostate: Secondary | ICD-10-CM | POA: Diagnosis not present

## 2018-10-14 DIAGNOSIS — E7800 Pure hypercholesterolemia, unspecified: Secondary | ICD-10-CM | POA: Diagnosis not present

## 2018-10-14 DIAGNOSIS — R7303 Prediabetes: Secondary | ICD-10-CM | POA: Diagnosis not present

## 2018-10-14 DIAGNOSIS — D72829 Elevated white blood cell count, unspecified: Secondary | ICD-10-CM | POA: Diagnosis not present

## 2018-10-14 DIAGNOSIS — I2510 Atherosclerotic heart disease of native coronary artery without angina pectoris: Secondary | ICD-10-CM | POA: Diagnosis not present

## 2018-10-14 DIAGNOSIS — G4733 Obstructive sleep apnea (adult) (pediatric): Secondary | ICD-10-CM | POA: Diagnosis not present

## 2018-10-14 DIAGNOSIS — F324 Major depressive disorder, single episode, in partial remission: Secondary | ICD-10-CM | POA: Diagnosis not present

## 2018-10-14 DIAGNOSIS — N183 Chronic kidney disease, stage 3 (moderate): Secondary | ICD-10-CM | POA: Diagnosis not present

## 2018-10-14 DIAGNOSIS — Z0000 Encounter for general adult medical examination without abnormal findings: Secondary | ICD-10-CM | POA: Diagnosis not present

## 2018-11-11 DIAGNOSIS — F322 Major depressive disorder, single episode, severe without psychotic features: Secondary | ICD-10-CM | POA: Diagnosis not present

## 2018-11-11 DIAGNOSIS — I2510 Atherosclerotic heart disease of native coronary artery without angina pectoris: Secondary | ICD-10-CM | POA: Diagnosis not present

## 2018-11-11 DIAGNOSIS — I10 Essential (primary) hypertension: Secondary | ICD-10-CM | POA: Diagnosis not present

## 2018-11-11 DIAGNOSIS — F324 Major depressive disorder, single episode, in partial remission: Secondary | ICD-10-CM | POA: Diagnosis not present

## 2018-11-11 DIAGNOSIS — N183 Chronic kidney disease, stage 3 (moderate): Secondary | ICD-10-CM | POA: Diagnosis not present

## 2018-12-26 DIAGNOSIS — E7800 Pure hypercholesterolemia, unspecified: Secondary | ICD-10-CM | POA: Diagnosis not present

## 2018-12-26 DIAGNOSIS — F322 Major depressive disorder, single episode, severe without psychotic features: Secondary | ICD-10-CM | POA: Diagnosis not present

## 2018-12-26 DIAGNOSIS — I2510 Atherosclerotic heart disease of native coronary artery without angina pectoris: Secondary | ICD-10-CM | POA: Diagnosis not present

## 2018-12-26 DIAGNOSIS — N1831 Chronic kidney disease, stage 3a: Secondary | ICD-10-CM | POA: Diagnosis not present

## 2018-12-26 DIAGNOSIS — F324 Major depressive disorder, single episode, in partial remission: Secondary | ICD-10-CM | POA: Diagnosis not present

## 2018-12-26 DIAGNOSIS — I10 Essential (primary) hypertension: Secondary | ICD-10-CM | POA: Diagnosis not present

## 2019-03-24 DIAGNOSIS — I10 Essential (primary) hypertension: Secondary | ICD-10-CM | POA: Diagnosis not present

## 2019-03-24 DIAGNOSIS — E7800 Pure hypercholesterolemia, unspecified: Secondary | ICD-10-CM | POA: Diagnosis not present

## 2019-03-24 DIAGNOSIS — I2510 Atherosclerotic heart disease of native coronary artery without angina pectoris: Secondary | ICD-10-CM | POA: Diagnosis not present

## 2019-03-24 DIAGNOSIS — F324 Major depressive disorder, single episode, in partial remission: Secondary | ICD-10-CM | POA: Diagnosis not present

## 2019-03-24 DIAGNOSIS — F322 Major depressive disorder, single episode, severe without psychotic features: Secondary | ICD-10-CM | POA: Diagnosis not present

## 2019-05-15 DIAGNOSIS — G2581 Restless legs syndrome: Secondary | ICD-10-CM | POA: Diagnosis not present

## 2019-05-15 DIAGNOSIS — R159 Full incontinence of feces: Secondary | ICD-10-CM | POA: Diagnosis not present

## 2019-05-15 DIAGNOSIS — F324 Major depressive disorder, single episode, in partial remission: Secondary | ICD-10-CM | POA: Diagnosis not present

## 2019-05-15 DIAGNOSIS — G629 Polyneuropathy, unspecified: Secondary | ICD-10-CM | POA: Diagnosis not present

## 2019-05-15 DIAGNOSIS — F411 Generalized anxiety disorder: Secondary | ICD-10-CM | POA: Diagnosis not present

## 2019-05-15 DIAGNOSIS — N183 Chronic kidney disease, stage 3 unspecified: Secondary | ICD-10-CM | POA: Diagnosis not present

## 2019-05-15 DIAGNOSIS — I2510 Atherosclerotic heart disease of native coronary artery without angina pectoris: Secondary | ICD-10-CM | POA: Diagnosis not present

## 2019-05-15 DIAGNOSIS — I10 Essential (primary) hypertension: Secondary | ICD-10-CM | POA: Diagnosis not present

## 2019-05-26 DIAGNOSIS — F324 Major depressive disorder, single episode, in partial remission: Secondary | ICD-10-CM | POA: Diagnosis not present

## 2019-05-26 DIAGNOSIS — I10 Essential (primary) hypertension: Secondary | ICD-10-CM | POA: Diagnosis not present

## 2019-05-26 DIAGNOSIS — I2510 Atherosclerotic heart disease of native coronary artery without angina pectoris: Secondary | ICD-10-CM | POA: Diagnosis not present

## 2019-05-26 DIAGNOSIS — E7800 Pure hypercholesterolemia, unspecified: Secondary | ICD-10-CM | POA: Diagnosis not present

## 2019-05-26 DIAGNOSIS — N183 Chronic kidney disease, stage 3 unspecified: Secondary | ICD-10-CM | POA: Diagnosis not present

## 2019-05-26 DIAGNOSIS — F322 Major depressive disorder, single episode, severe without psychotic features: Secondary | ICD-10-CM | POA: Diagnosis not present

## 2019-06-20 ENCOUNTER — Emergency Department (HOSPITAL_COMMUNITY)
Admission: EM | Admit: 2019-06-20 | Discharge: 2019-06-21 | Disposition: A | Discharge status: Home / Self Care | Payer: Medicare HMO | Attending: Emergency Medicine | Admitting: Emergency Medicine

## 2019-06-20 DIAGNOSIS — Z87891 Personal history of nicotine dependence: Secondary | ICD-10-CM | POA: Diagnosis not present

## 2019-06-20 DIAGNOSIS — I2510 Atherosclerotic heart disease of native coronary artery without angina pectoris: Secondary | ICD-10-CM | POA: Insufficient documentation

## 2019-06-20 DIAGNOSIS — R197 Diarrhea, unspecified: Secondary | ICD-10-CM | POA: Diagnosis not present

## 2019-06-20 DIAGNOSIS — I10 Essential (primary) hypertension: Secondary | ICD-10-CM | POA: Diagnosis not present

## 2019-06-20 DIAGNOSIS — Z79899 Other long term (current) drug therapy: Secondary | ICD-10-CM | POA: Diagnosis not present

## 2019-06-20 DIAGNOSIS — I252 Old myocardial infarction: Secondary | ICD-10-CM | POA: Diagnosis not present

## 2019-06-20 DIAGNOSIS — Z20822 Contact with and (suspected) exposure to covid-19: Secondary | ICD-10-CM | POA: Insufficient documentation

## 2019-06-20 DIAGNOSIS — R5381 Other malaise: Secondary | ICD-10-CM | POA: Diagnosis not present

## 2019-06-20 DIAGNOSIS — R112 Nausea with vomiting, unspecified: Secondary | ICD-10-CM | POA: Insufficient documentation

## 2019-06-20 DIAGNOSIS — R1111 Vomiting without nausea: Secondary | ICD-10-CM | POA: Diagnosis not present

## 2019-06-20 DIAGNOSIS — R110 Nausea: Secondary | ICD-10-CM | POA: Diagnosis not present

## 2019-06-20 LAB — CBC
HCT: 45.2 % (ref 39.0–52.0)
Hemoglobin: 15.5 g/dL (ref 13.0–17.0)
MCH: 29.9 pg (ref 26.0–34.0)
MCHC: 34.3 g/dL (ref 30.0–36.0)
MCV: 87.3 fL (ref 80.0–100.0)
Platelets: 326 10*3/uL (ref 150–400)
RBC: 5.18 MIL/uL (ref 4.22–5.81)
RDW: 12.5 % (ref 11.5–15.5)
WBC: 15.5 10*3/uL — ABNORMAL HIGH (ref 4.0–10.5)
nRBC: 0 % (ref 0.0–0.2)

## 2019-06-20 LAB — COMPREHENSIVE METABOLIC PANEL
ALT: 23 U/L (ref 0–44)
AST: 25 U/L (ref 15–41)
Albumin: 4.1 g/dL (ref 3.5–5.0)
Alkaline Phosphatase: 98 U/L (ref 38–126)
Anion gap: 15 (ref 5–15)
BUN: 14 mg/dL (ref 8–23)
CO2: 19 mmol/L — ABNORMAL LOW (ref 22–32)
Calcium: 9.8 mg/dL (ref 8.9–10.3)
Chloride: 106 mmol/L (ref 98–111)
Creatinine, Ser: 1.09 mg/dL (ref 0.61–1.24)
GFR calc Af Amer: >60 mL/min (ref >60)
GFR calc non Af Amer: >60 mL/min (ref >60)
Glucose, Bld: 119 mg/dL — ABNORMAL HIGH (ref 70–99)
Potassium: 3.4 mmol/L — ABNORMAL LOW (ref 3.5–5.1)
Sodium: 140 mmol/L (ref 135–145)
Total Bilirubin: 1.1 mg/dL (ref 0.3–1.2)
Total Protein: 7.2 g/dL (ref 6.5–8.1)

## 2019-06-20 LAB — LIPASE, BLOOD: Lipase: 27 U/L (ref 11–51)

## 2019-06-20 MED ORDER — ONDANSETRON 4 MG PO TBDP
8.0000 mg | ORAL_TABLET | Freq: Once | ORAL | Status: AC
  Administered 2019-06-20: 8 mg via ORAL
  Filled 2019-06-20: qty 2

## 2019-06-20 MED ORDER — SODIUM CHLORIDE 0.9% FLUSH
3.0000 mL | Freq: Once | INTRAVENOUS | Status: AC
  Administered 2019-06-21: 3 mL via INTRAVENOUS

## 2019-06-20 NOTE — ED Triage Notes (Signed)
Pt from home by EMS for abd pain with NV and dry heaves since eating bojangles. Emesis started last night.

## 2019-06-21 ENCOUNTER — Other Ambulatory Visit: Payer: Self-pay

## 2019-06-21 DIAGNOSIS — R112 Nausea with vomiting, unspecified: Secondary | ICD-10-CM | POA: Diagnosis not present

## 2019-06-21 LAB — SARS CORONAVIRUS 2 (TAT 6-24 HRS): SARS Coronavirus 2: NEGATIVE

## 2019-06-21 MED ORDER — ONDANSETRON HCL 4 MG PO TABS
4.0000 mg | ORAL_TABLET | Freq: Three times a day (TID) | ORAL | 0 refills | Status: DC | PRN
Start: 2019-06-21 — End: 2021-01-16

## 2019-06-21 MED ORDER — POTASSIUM CHLORIDE CRYS ER 20 MEQ PO TBCR
40.0000 meq | EXTENDED_RELEASE_TABLET | Freq: Once | ORAL | Status: AC
  Administered 2019-06-21: 40 meq via ORAL
  Filled 2019-06-21: qty 2

## 2019-06-21 MED ORDER — SODIUM CHLORIDE 0.9 % IV BOLUS
500.0000 mL | Freq: Once | INTRAVENOUS | Status: AC
  Administered 2019-06-21: 500 mL via INTRAVENOUS

## 2019-06-21 MED ORDER — LOPERAMIDE HCL 2 MG PO CAPS
2.0000 mg | ORAL_CAPSULE | Freq: Four times a day (QID) | ORAL | 0 refills | Status: DC | PRN
Start: 2019-06-21 — End: 2021-01-16

## 2019-06-21 NOTE — ED Provider Notes (Signed)
[facility] EMERGENCY DEPARTMENT Provider Note   CSN: [id number] Arrival date & time: 06/20/19  1905     History Chief Complaint  Patient presents with  . Abdominal Pain    Brendan Butler is a 69 y.o. male with history of hyperlipidemia, hypertension, CAD presenting for evaluation of acute onset, persistent nausea, vomiting for 2 days.  Reports that symptoms began 30 minutes after eating fried chicken from [employer].  He does not have any sick contacts, denies any other suspicious food intake, recent travel, or recent treatment with antibiotics.  He notes multiple episodes of nonbloody nonbilious emesis over the last 2 days and has had persistent dry heaves.  He notes some lower abdominal discomfort that occurs with dry heaving only.  Has also had a few episodes of nonbloody watery diarrhea.  He denies urinary symptoms, chest pain, shortness of breath, fevers, cough.  He has not tried anything for his symptoms.  He has not been taking all of his home medications due to his symptoms.  The history is provided by the patient.       Past Medical History:  Diagnosis Date  . Acute myocardial infarction of other anterior wall, initial episode of care 12/29/12   Promus stent to LAD  . Acute ST segment elevation MI (HCC) 01/05/13   secondary to thrombus in stent; Brilintta changed to Effient pt with ASA allergy  . Coronary artery disease 2001   stent to LAD and patent 2007 on cath  . Echocardiogram abnormal 01/06/13   EF 45-50%, grade I diastolic dysfunction  . Erectile dysfunction   . Hyperlipidemia LDL goal <70   . Hypertension   . OSA (obstructive sleep apnea)    No CPAP use overnight    Patient Active Problem List   Diagnosis Date Noted  . Traumatic hematoma of left popliteal region 01/28/2016  . DJD (degenerative joint disease) of pelvis 01/28/2016  . Atypical chest pain   . Chest pain 01/27/2016  . Unstable angina (HCC) 04/21/2013  . Leukocytosis  04/21/2013  . Subsequent ST elevation (STEMI) myocardial infarction of anterior wall within 4 weeks of initial infarction [facility]) 01/08/2013    Class: Hospitalized for  . Illiteracy and low-level literacy 01/08/2013  . Aspirin allergy- hives 01/06/2013  . STEMI 12/29/12 Rx'd with LAD DES with early stent thrombosis, STEMI-PCI 01/05/14 01/05/2013  . CAD S/P STEMI 12/29/12 Mid LAD occlusion treated with DES 06/29/2007  . HYPERLIPIDEMIA 06/25/2007  . HYPERTENSION 06/25/2007  . ALLERGIC RHINITIS 06/25/2007  . SLEEP APNEA- C-pap intol 06/25/2007    Past Surgical History:  Procedure Laterality Date  . BACK SURGERY  2007   Decompression lumbar laminectomy and micro discectomy, L4-5  . CARDIAC CATHETERIZATION  2007   patent stent to LAD and normal Cors  . CORONARY ANGIOPLASTY WITH STENT PLACEMENT  2001   to LAD  . CORONARY ANGIOPLASTY WITH STENT PLACEMENT  12/29/12   STEMI with promus DES to LAD  . CORONARY ANGIOPLASTY WITH STENT PLACEMENT  01/05/13   STEMI with thrombosis in stent to LAD  . LEFT HEART CATH N/A 12/29/2012   Procedure: LEFT HEART CATH;  Surgeon: Corky Crafts, MD;  Location: [facility] CATH LAB;  Service: Cardiovascular;  Laterality: N/A;  . LEFT HEART CATHETERIZATION WITH CORONARY ANGIOGRAM N/A 01/05/2013   Procedure: LEFT HEART CATHETERIZATION WITH CORONARY ANGIOGRAM;  Surgeon: Micheline Chapman, MD;  Location: [facility] CATH LAB;  Service: Cardiovascular;  Laterality: N/A;  . LEFT HEART CATHETERIZATION WITH CORONARY ANGIOGRAM N/A  04/22/2013   Procedure: LEFT HEART CATHETERIZATION WITH CORONARY ANGIOGRAM;  Surgeon: Lennette Bihari, MD;  Location: [facility] CATH LAB;  Service: Cardiovascular;  Laterality: N/A;  . PARTIAL NEPHRECTOMY Left 1975   PATIENT ONLY HAS ONE KIDNEY  . PERCUTANEOUS CORONARY STENT INTERVENTION (PCI-S)  12/29/2012   Procedure: PERCUTANEOUS CORONARY STENT INTERVENTION (PCI-S);  Surgeon: Corky Crafts, MD;  Location: [facility] CATH LAB;  Service: Cardiovascular;;       Family  History  Problem Relation Age of Onset  . Alzheimer's disease Mother     Social History   Tobacco Use  . Smoking status: Former Smoker    Packs/day: 2.00    Years: 25.00    Pack years: 50.00    Types: Cigarettes    Quit date: 2001    Years since quitting: 20.3  . Smokeless tobacco: Current User    Types: Chew  Substance Use Topics  . Alcohol use: No    Comment: 2001 stopped drinking  . Drug use: No    Home Medications Prior to Admission medications   Medication Sig Start Date End Date Taking? Authorizing Provider  buPROPion (WELLBUTRIN) 100 MG tablet Take 100 mg by mouth 2 (two) times daily.    [provider]  furosemide (LASIX) 20 MG tablet Take 1 tablet (20 mg total) by mouth daily. 01/02/13   [name] M, PA-C  lisinopril (PRINIVIL,ZESTRIL) 2.5 MG tablet Take 2.5 mg by mouth 2 (two) times daily.    [provider]  loperamide (IMODIUM) 2 MG capsule Take 1 capsule (2 mg total) by mouth 4 (four) times daily as needed for diarrhea or loose stools. 06/21/19   Nike Southwell A, PA-C  LYRICA 75 MG capsule Take 75 mg by mouth 2 (two) times daily. 01/18/16   [provider]  Multiple Vitamin (MULTIVITAMIN WITH MINERALS) TABS tablet Take 1 tablet by mouth daily.    [provider]  nitroGLYCERIN (NITROSTAT) 0.4 MG SL tablet Place 1 tablet (0.4 mg total) under the tongue every 5 (five) minutes x 3 doses as needed for chest pain. NEED OV. 05/09/15   Runell Gess, MD  ondansetron (ZOFRAN) 4 MG tablet Take 1 tablet (4 mg total) by mouth every 8 (eight) hours as needed for nausea or vomiting. 06/21/19   Luevenia Maxin, Elowyn Raupp A, PA-C  PARoxetine (PAXIL) 20 MG tablet Take 20 mg by mouth daily. 01/24/16   [provider]  potassium chloride (K-DUR,KLOR-CON) 10 MEQ tablet Take 1 tablet by mouth daily. 07/23/13   [provider]  prasugrel (EFFIENT) 10 MG TABS tablet Take 1 tablet (10 mg total) by mouth daily. 03/11/13   Runell Gess, MD    ranitidine (ZANTAC) 300 MG tablet Take 300 mg by mouth daily.     [provider]  rosuvastatin (CRESTOR) 10 MG tablet Take 10 mg by mouth daily. 12/12/15   [provider]    Allergies    Levitra [vardenafil], Aspirin, Clopidogrel, and Lipitor [atorvastatin]  Review of Systems   Review of Systems  Constitutional: Negative for chills and fever.  Respiratory: Negative for shortness of breath.   Cardiovascular: Negative for chest pain.  Gastrointestinal: Positive for abdominal pain, diarrhea, nausea and vomiting.  All other systems reviewed and are negative.   Physical Exam Updated Vital Signs BP (!) 150/75 (BP Location: Left Arm)   Pulse 67   Temp 98.3 F (36.8 C) (Oral)   Resp 17   Ht 5\' 10"  (1.778 m)   Wt 94.8 kg  SpO2 97%   BMI 29.99 kg/m   Physical Exam Vitals and nursing note reviewed.  Constitutional:      General: He is not in acute distress.    Appearance: He is well-developed.  HENT:     Head: Normocephalic and atraumatic.  Eyes:     General:        Right eye: No discharge.        Left eye: No discharge.     Conjunctiva/sclera: Conjunctivae normal.  Neck:     Vascular: No JVD.     Trachea: No tracheal deviation.  Cardiovascular:     Rate and Rhythm: Normal rate and regular rhythm.  Pulmonary:     Effort: Pulmonary effort is normal.     Breath sounds: Normal breath sounds.  Abdominal:     General: A surgical scar is present. Bowel sounds are normal. There is no distension.     Palpations: Abdomen is soft.     Tenderness: There is no abdominal tenderness. There is no right CVA tenderness, left CVA tenderness, guarding or rebound.  Skin:    General: Skin is warm and dry.     Findings: No erythema.  Neurological:     Mental Status: He is alert.  Psychiatric:        Behavior: Behavior normal.     ED Results / Procedures / Treatments   Labs (all labs ordered are listed, but only abnormal results are displayed) Labs Reviewed   COMPREHENSIVE METABOLIC PANEL - Abnormal; Notable for the following components:      Result Value   Potassium 3.4 (*)    CO2 19 (*)    Glucose, Bld 119 (*)    All other components within normal limits  CBC - Abnormal; Notable for the following components:   WBC 15.5 (*)    All other components within normal limits  SARS CORONAVIRUS 2 (TAT 6-24 HRS)  LIPASE, BLOOD    EKG None  Radiology No results found.  Procedures Procedures (including critical care time)  Medications Ordered in ED Medications  sodium chloride flush (NS) 0.9 % injection 3 mL (3 mLs Intravenous Given 06/21/19 0434)  ondansetron (ZOFRAN-ODT) disintegrating tablet 8 mg (8 mg Oral Given 06/20/19 2024)  sodium chloride 0.9 % bolus 500 mL (0 mLs Intravenous Stopped 06/21/19 0550)  potassium chloride SA (KLOR-CON) CR tablet 40 mEq (40 mEq Oral Given 06/21/19 0434)    ED Course  I have reviewed the triage vital signs and the nursing notes.  Pertinent labs & imaging results that were available during my care of the patient were reviewed by me and considered in my medical decision making (see chart for details).    MDM Rules/Calculators/A&P                      Patient presenting for evaluation of nausea, vomiting, diarrhea and intermittent crampy abdominal pains that began after eating [employer].  He is afebrile, initially hypertensive but with significant improvement on my assessment.  His abdomen is soft and entirely nontender with no rebound or guarding.  He reports that his symptoms have been steadily improving since he began.  He has no fevers, cough, shortness of breath or chest pain.  Lab work reviewed by me shows nonspecific leukocytosis, per chart review it appears he chronically has a leukocytosis.  He has no anemia, no metabolic derangements, no renal insufficiency.  He is mildly hypokalemic which was replenished orally in the ED.  He received Zofran in  the waiting room and on my assessment tells me his nausea  has improved.  He is tolerating p.o. fluids.  Given reassuring lab work and physical examination I have a low suspicion of acute surgical abdominal pathology including obstruction, perforation, appendicitis, cholecystitis.  We discussed the utility of obtaining imaging and he would like to hold off at this time which I think is reasonable.  He would rather manage his symptoms at home.  We discussed advancing diet slowly, pushing fluids, Zofran for nausea and vomiting.  I suspect he has a viral gastroenteritis likely.  He will follow up with his PCP on an outpatient basis for reevaluation of his symptoms.  Discussed strict ED return precautions. Patient verbalized understanding of and agreement with plan and is safe for discharge home at this time.    Final Clinical Impression(s) / ED Diagnoses Final diagnoses:  Nausea vomiting and diarrhea    Rx / DC Orders ED Discharge Orders         Ordered    ondansetron (ZOFRAN) 4 MG tablet  Every 8 hours PRN     06/21/19 0529    loperamide (IMODIUM) 2 MG capsule  4 times daily PRN     06/21/19 0529           Renita Papa, PA-C 06/21/19 2215    Fatima Blank, MD 06/22/19 [phone]

## 2019-06-21 NOTE — Discharge Instructions (Signed)
1. Medications: You can take Imodium as needed for diarrhea and abdominal cramping.  Take Zofran as needed for nausea.  Let this medicine dissolve under the tongue and wait around 20 minutes before eating or drinking after taking this medication. 2. Treatment: rest, drink plenty of fluids, advance diet slowly.  Start with water and broth then advance to bland foods that will not upset your stomach such as crackers, mashed potatoes, and peanut butter. 3. Follow Up: Please followup with your primary doctor in 3 days for discussion of your diagnoses and further evaluation after today's visit; if you do not have a primary care doctor use the resource guide provided to find one; Please return to the ER for persistent vomiting, high fevers or worsening symptoms

## 2019-06-26 DIAGNOSIS — I2510 Atherosclerotic heart disease of native coronary artery without angina pectoris: Secondary | ICD-10-CM | POA: Diagnosis not present

## 2019-06-26 DIAGNOSIS — I10 Essential (primary) hypertension: Secondary | ICD-10-CM | POA: Diagnosis not present

## 2019-06-26 DIAGNOSIS — N183 Chronic kidney disease, stage 3 unspecified: Secondary | ICD-10-CM | POA: Diagnosis not present

## 2019-06-26 DIAGNOSIS — F324 Major depressive disorder, single episode, in partial remission: Secondary | ICD-10-CM | POA: Diagnosis not present

## 2019-06-26 DIAGNOSIS — E7800 Pure hypercholesterolemia, unspecified: Secondary | ICD-10-CM | POA: Diagnosis not present

## 2019-06-26 DIAGNOSIS — F322 Major depressive disorder, single episode, severe without psychotic features: Secondary | ICD-10-CM | POA: Diagnosis not present

## 2019-08-25 DIAGNOSIS — N183 Chronic kidney disease, stage 3 unspecified: Secondary | ICD-10-CM | POA: Diagnosis not present

## 2019-08-25 DIAGNOSIS — F324 Major depressive disorder, single episode, in partial remission: Secondary | ICD-10-CM | POA: Diagnosis not present

## 2019-08-25 DIAGNOSIS — F322 Major depressive disorder, single episode, severe without psychotic features: Secondary | ICD-10-CM | POA: Diagnosis not present

## 2019-08-25 DIAGNOSIS — I2510 Atherosclerotic heart disease of native coronary artery without angina pectoris: Secondary | ICD-10-CM | POA: Diagnosis not present

## 2019-08-25 DIAGNOSIS — I10 Essential (primary) hypertension: Secondary | ICD-10-CM | POA: Diagnosis not present

## 2019-08-25 DIAGNOSIS — E7800 Pure hypercholesterolemia, unspecified: Secondary | ICD-10-CM | POA: Diagnosis not present

## 2019-09-25 DIAGNOSIS — E7800 Pure hypercholesterolemia, unspecified: Secondary | ICD-10-CM | POA: Diagnosis not present

## 2019-09-25 DIAGNOSIS — F322 Major depressive disorder, single episode, severe without psychotic features: Secondary | ICD-10-CM | POA: Diagnosis not present

## 2019-09-25 DIAGNOSIS — F324 Major depressive disorder, single episode, in partial remission: Secondary | ICD-10-CM | POA: Diagnosis not present

## 2019-09-25 DIAGNOSIS — N183 Chronic kidney disease, stage 3 unspecified: Secondary | ICD-10-CM | POA: Diagnosis not present

## 2019-09-25 DIAGNOSIS — I10 Essential (primary) hypertension: Secondary | ICD-10-CM | POA: Diagnosis not present

## 2019-09-25 DIAGNOSIS — I2510 Atherosclerotic heart disease of native coronary artery without angina pectoris: Secondary | ICD-10-CM | POA: Diagnosis not present

## 2019-10-26 DIAGNOSIS — F324 Major depressive disorder, single episode, in partial remission: Secondary | ICD-10-CM | POA: Diagnosis not present

## 2019-10-26 DIAGNOSIS — F322 Major depressive disorder, single episode, severe without psychotic features: Secondary | ICD-10-CM | POA: Diagnosis not present

## 2019-10-26 DIAGNOSIS — I2510 Atherosclerotic heart disease of native coronary artery without angina pectoris: Secondary | ICD-10-CM | POA: Diagnosis not present

## 2019-10-26 DIAGNOSIS — I10 Essential (primary) hypertension: Secondary | ICD-10-CM | POA: Diagnosis not present

## 2019-10-26 DIAGNOSIS — N183 Chronic kidney disease, stage 3 unspecified: Secondary | ICD-10-CM | POA: Diagnosis not present

## 2019-10-26 DIAGNOSIS — E7800 Pure hypercholesterolemia, unspecified: Secondary | ICD-10-CM | POA: Diagnosis not present

## 2019-11-10 DIAGNOSIS — F324 Major depressive disorder, single episode, in partial remission: Secondary | ICD-10-CM | POA: Diagnosis not present

## 2019-11-10 DIAGNOSIS — I2510 Atherosclerotic heart disease of native coronary artery without angina pectoris: Secondary | ICD-10-CM | POA: Diagnosis not present

## 2019-11-10 DIAGNOSIS — I10 Essential (primary) hypertension: Secondary | ICD-10-CM | POA: Diagnosis not present

## 2019-11-10 DIAGNOSIS — N183 Chronic kidney disease, stage 3 unspecified: Secondary | ICD-10-CM | POA: Diagnosis not present

## 2019-11-10 DIAGNOSIS — F322 Major depressive disorder, single episode, severe without psychotic features: Secondary | ICD-10-CM | POA: Diagnosis not present

## 2019-11-10 DIAGNOSIS — E7800 Pure hypercholesterolemia, unspecified: Secondary | ICD-10-CM | POA: Diagnosis not present

## 2020-01-13 DIAGNOSIS — E7800 Pure hypercholesterolemia, unspecified: Secondary | ICD-10-CM | POA: Diagnosis not present

## 2020-01-13 DIAGNOSIS — F322 Major depressive disorder, single episode, severe without psychotic features: Secondary | ICD-10-CM | POA: Diagnosis not present

## 2020-01-13 DIAGNOSIS — F324 Major depressive disorder, single episode, in partial remission: Secondary | ICD-10-CM | POA: Diagnosis not present

## 2020-01-13 DIAGNOSIS — I10 Essential (primary) hypertension: Secondary | ICD-10-CM | POA: Diagnosis not present

## 2020-01-13 DIAGNOSIS — I2510 Atherosclerotic heart disease of native coronary artery without angina pectoris: Secondary | ICD-10-CM | POA: Diagnosis not present

## 2020-01-13 DIAGNOSIS — K219 Gastro-esophageal reflux disease without esophagitis: Secondary | ICD-10-CM | POA: Diagnosis not present

## 2020-01-13 DIAGNOSIS — N183 Chronic kidney disease, stage 3 unspecified: Secondary | ICD-10-CM | POA: Diagnosis not present

## 2020-02-10 DIAGNOSIS — I10 Essential (primary) hypertension: Secondary | ICD-10-CM | POA: Diagnosis not present

## 2020-02-10 DIAGNOSIS — F324 Major depressive disorder, single episode, in partial remission: Secondary | ICD-10-CM | POA: Diagnosis not present

## 2020-02-10 DIAGNOSIS — E7800 Pure hypercholesterolemia, unspecified: Secondary | ICD-10-CM | POA: Diagnosis not present

## 2020-02-10 DIAGNOSIS — I2510 Atherosclerotic heart disease of native coronary artery without angina pectoris: Secondary | ICD-10-CM | POA: Diagnosis not present

## 2020-02-10 DIAGNOSIS — N183 Chronic kidney disease, stage 3 unspecified: Secondary | ICD-10-CM | POA: Diagnosis not present

## 2020-02-10 DIAGNOSIS — K219 Gastro-esophageal reflux disease without esophagitis: Secondary | ICD-10-CM | POA: Diagnosis not present

## 2020-03-21 DIAGNOSIS — I10 Essential (primary) hypertension: Secondary | ICD-10-CM | POA: Diagnosis not present

## 2020-03-21 DIAGNOSIS — N183 Chronic kidney disease, stage 3 unspecified: Secondary | ICD-10-CM | POA: Diagnosis not present

## 2020-03-21 DIAGNOSIS — K219 Gastro-esophageal reflux disease without esophagitis: Secondary | ICD-10-CM | POA: Diagnosis not present

## 2020-03-21 DIAGNOSIS — I2510 Atherosclerotic heart disease of native coronary artery without angina pectoris: Secondary | ICD-10-CM | POA: Diagnosis not present

## 2020-03-21 DIAGNOSIS — E7800 Pure hypercholesterolemia, unspecified: Secondary | ICD-10-CM | POA: Diagnosis not present

## 2020-03-21 DIAGNOSIS — F324 Major depressive disorder, single episode, in partial remission: Secondary | ICD-10-CM | POA: Diagnosis not present

## 2020-04-27 DIAGNOSIS — N183 Chronic kidney disease, stage 3 unspecified: Secondary | ICD-10-CM | POA: Diagnosis not present

## 2020-04-27 DIAGNOSIS — E7800 Pure hypercholesterolemia, unspecified: Secondary | ICD-10-CM | POA: Diagnosis not present

## 2020-04-27 DIAGNOSIS — I10 Essential (primary) hypertension: Secondary | ICD-10-CM | POA: Diagnosis not present

## 2020-04-27 DIAGNOSIS — Z125 Encounter for screening for malignant neoplasm of prostate: Secondary | ICD-10-CM | POA: Diagnosis not present

## 2020-04-27 DIAGNOSIS — R251 Tremor, unspecified: Secondary | ICD-10-CM | POA: Diagnosis not present

## 2020-04-27 DIAGNOSIS — Z0000 Encounter for general adult medical examination without abnormal findings: Secondary | ICD-10-CM | POA: Diagnosis not present

## 2020-04-27 DIAGNOSIS — R7309 Other abnormal glucose: Secondary | ICD-10-CM | POA: Diagnosis not present

## 2020-04-27 DIAGNOSIS — D72829 Elevated white blood cell count, unspecified: Secondary | ICD-10-CM | POA: Diagnosis not present

## 2020-04-27 DIAGNOSIS — I2510 Atherosclerotic heart disease of native coronary artery without angina pectoris: Secondary | ICD-10-CM | POA: Diagnosis not present

## 2020-05-06 DIAGNOSIS — F331 Major depressive disorder, recurrent, moderate: Secondary | ICD-10-CM | POA: Diagnosis not present

## 2020-05-06 DIAGNOSIS — E7800 Pure hypercholesterolemia, unspecified: Secondary | ICD-10-CM | POA: Diagnosis not present

## 2020-05-06 DIAGNOSIS — F324 Major depressive disorder, single episode, in partial remission: Secondary | ICD-10-CM | POA: Diagnosis not present

## 2020-05-06 DIAGNOSIS — I10 Essential (primary) hypertension: Secondary | ICD-10-CM | POA: Diagnosis not present

## 2020-05-06 DIAGNOSIS — I2510 Atherosclerotic heart disease of native coronary artery without angina pectoris: Secondary | ICD-10-CM | POA: Diagnosis not present

## 2020-05-06 DIAGNOSIS — K219 Gastro-esophageal reflux disease without esophagitis: Secondary | ICD-10-CM | POA: Diagnosis not present

## 2020-05-06 DIAGNOSIS — N183 Chronic kidney disease, stage 3 unspecified: Secondary | ICD-10-CM | POA: Diagnosis not present

## 2020-05-06 DIAGNOSIS — F322 Major depressive disorder, single episode, severe without psychotic features: Secondary | ICD-10-CM | POA: Diagnosis not present

## 2020-06-02 DIAGNOSIS — H6591 Unspecified nonsuppurative otitis media, right ear: Secondary | ICD-10-CM | POA: Diagnosis not present

## 2020-06-02 DIAGNOSIS — I10 Essential (primary) hypertension: Secondary | ICD-10-CM | POA: Diagnosis not present

## 2020-06-02 DIAGNOSIS — H6123 Impacted cerumen, bilateral: Secondary | ICD-10-CM | POA: Diagnosis not present

## 2020-06-20 DIAGNOSIS — I10 Essential (primary) hypertension: Secondary | ICD-10-CM | POA: Diagnosis not present

## 2020-06-20 DIAGNOSIS — I2510 Atherosclerotic heart disease of native coronary artery without angina pectoris: Secondary | ICD-10-CM | POA: Diagnosis not present

## 2020-06-20 DIAGNOSIS — E7800 Pure hypercholesterolemia, unspecified: Secondary | ICD-10-CM | POA: Diagnosis not present

## 2020-06-20 DIAGNOSIS — N183 Chronic kidney disease, stage 3 unspecified: Secondary | ICD-10-CM | POA: Diagnosis not present

## 2020-06-20 DIAGNOSIS — K219 Gastro-esophageal reflux disease without esophagitis: Secondary | ICD-10-CM | POA: Diagnosis not present

## 2020-06-20 DIAGNOSIS — F322 Major depressive disorder, single episode, severe without psychotic features: Secondary | ICD-10-CM | POA: Diagnosis not present

## 2020-07-15 DIAGNOSIS — N183 Chronic kidney disease, stage 3 unspecified: Secondary | ICD-10-CM | POA: Diagnosis not present

## 2020-07-15 DIAGNOSIS — K219 Gastro-esophageal reflux disease without esophagitis: Secondary | ICD-10-CM | POA: Diagnosis not present

## 2020-07-15 DIAGNOSIS — F324 Major depressive disorder, single episode, in partial remission: Secondary | ICD-10-CM | POA: Diagnosis not present

## 2020-07-15 DIAGNOSIS — E7800 Pure hypercholesterolemia, unspecified: Secondary | ICD-10-CM | POA: Diagnosis not present

## 2020-07-15 DIAGNOSIS — I2510 Atherosclerotic heart disease of native coronary artery without angina pectoris: Secondary | ICD-10-CM | POA: Diagnosis not present

## 2020-07-15 DIAGNOSIS — F331 Major depressive disorder, recurrent, moderate: Secondary | ICD-10-CM | POA: Diagnosis not present

## 2020-07-15 DIAGNOSIS — I10 Essential (primary) hypertension: Secondary | ICD-10-CM | POA: Diagnosis not present

## 2020-07-15 DIAGNOSIS — F322 Major depressive disorder, single episode, severe without psychotic features: Secondary | ICD-10-CM | POA: Diagnosis not present

## 2020-09-02 DIAGNOSIS — K219 Gastro-esophageal reflux disease without esophagitis: Secondary | ICD-10-CM | POA: Diagnosis not present

## 2020-09-02 DIAGNOSIS — F331 Major depressive disorder, recurrent, moderate: Secondary | ICD-10-CM | POA: Diagnosis not present

## 2020-09-02 DIAGNOSIS — E7800 Pure hypercholesterolemia, unspecified: Secondary | ICD-10-CM | POA: Diagnosis not present

## 2020-09-02 DIAGNOSIS — I2510 Atherosclerotic heart disease of native coronary artery without angina pectoris: Secondary | ICD-10-CM | POA: Diagnosis not present

## 2020-09-02 DIAGNOSIS — I10 Essential (primary) hypertension: Secondary | ICD-10-CM | POA: Diagnosis not present

## 2020-09-02 DIAGNOSIS — N183 Chronic kidney disease, stage 3 unspecified: Secondary | ICD-10-CM | POA: Diagnosis not present

## 2020-11-09 DIAGNOSIS — Z1211 Encounter for screening for malignant neoplasm of colon: Secondary | ICD-10-CM | POA: Diagnosis not present

## 2020-11-09 DIAGNOSIS — I2510 Atherosclerotic heart disease of native coronary artery without angina pectoris: Secondary | ICD-10-CM | POA: Diagnosis not present

## 2020-11-09 DIAGNOSIS — I10 Essential (primary) hypertension: Secondary | ICD-10-CM | POA: Diagnosis not present

## 2020-11-09 DIAGNOSIS — G629 Polyneuropathy, unspecified: Secondary | ICD-10-CM | POA: Diagnosis not present

## 2020-11-09 DIAGNOSIS — R7303 Prediabetes: Secondary | ICD-10-CM | POA: Diagnosis not present

## 2020-11-09 DIAGNOSIS — F331 Major depressive disorder, recurrent, moderate: Secondary | ICD-10-CM | POA: Diagnosis not present

## 2020-11-09 DIAGNOSIS — F411 Generalized anxiety disorder: Secondary | ICD-10-CM | POA: Diagnosis not present

## 2020-11-09 DIAGNOSIS — G4733 Obstructive sleep apnea (adult) (pediatric): Secondary | ICD-10-CM | POA: Diagnosis not present

## 2020-12-23 DIAGNOSIS — K219 Gastro-esophageal reflux disease without esophagitis: Secondary | ICD-10-CM | POA: Diagnosis not present

## 2020-12-23 DIAGNOSIS — F331 Major depressive disorder, recurrent, moderate: Secondary | ICD-10-CM | POA: Diagnosis not present

## 2020-12-23 DIAGNOSIS — I10 Essential (primary) hypertension: Secondary | ICD-10-CM | POA: Diagnosis not present

## 2020-12-23 DIAGNOSIS — I2510 Atherosclerotic heart disease of native coronary artery without angina pectoris: Secondary | ICD-10-CM | POA: Diagnosis not present

## 2020-12-23 DIAGNOSIS — E7800 Pure hypercholesterolemia, unspecified: Secondary | ICD-10-CM | POA: Diagnosis not present

## 2020-12-23 DIAGNOSIS — N183 Chronic kidney disease, stage 3 unspecified: Secondary | ICD-10-CM | POA: Diagnosis not present

## 2021-01-16 ENCOUNTER — Other Ambulatory Visit: Payer: Self-pay

## 2021-01-16 ENCOUNTER — Emergency Department (HOSPITAL_COMMUNITY): Payer: Medicare HMO

## 2021-01-16 ENCOUNTER — Inpatient Hospital Stay (HOSPITAL_COMMUNITY)
Admission: EM | Admit: 2021-01-16 | Discharge: 2021-01-17 | DRG: 191 | Discharge status: Against Medical Advice | Payer: Medicare HMO | Attending: Family Medicine | Admitting: Family Medicine

## 2021-01-16 ENCOUNTER — Encounter (HOSPITAL_COMMUNITY): Payer: Self-pay

## 2021-01-16 ENCOUNTER — Inpatient Hospital Stay (HOSPITAL_COMMUNITY): Payer: Medicare HMO

## 2021-01-16 DIAGNOSIS — I129 Hypertensive chronic kidney disease with stage 1 through stage 4 chronic kidney disease, or unspecified chronic kidney disease: Secondary | ICD-10-CM | POA: Diagnosis not present

## 2021-01-16 DIAGNOSIS — Z955 Presence of coronary angioplasty implant and graft: Secondary | ICD-10-CM

## 2021-01-16 DIAGNOSIS — R6510 Systemic inflammatory response syndrome (SIRS) of non-infectious origin without acute organ dysfunction: Secondary | ICD-10-CM | POA: Diagnosis present

## 2021-01-16 DIAGNOSIS — N179 Acute kidney failure, unspecified: Secondary | ICD-10-CM | POA: Diagnosis not present

## 2021-01-16 DIAGNOSIS — I2510 Atherosclerotic heart disease of native coronary artery without angina pectoris: Secondary | ICD-10-CM | POA: Diagnosis present

## 2021-01-16 DIAGNOSIS — N183 Chronic kidney disease, stage 3 unspecified: Secondary | ICD-10-CM | POA: Diagnosis not present

## 2021-01-16 DIAGNOSIS — Z888 Allergy status to other drugs, medicaments and biological substances status: Secondary | ICD-10-CM

## 2021-01-16 DIAGNOSIS — N1831 Chronic kidney disease, stage 3a: Secondary | ICD-10-CM | POA: Diagnosis not present

## 2021-01-16 DIAGNOSIS — G4733 Obstructive sleep apnea (adult) (pediatric): Secondary | ICD-10-CM | POA: Diagnosis present

## 2021-01-16 DIAGNOSIS — K651 Peritoneal abscess: Secondary | ICD-10-CM | POA: Diagnosis present

## 2021-01-16 DIAGNOSIS — R0602 Shortness of breath: Secondary | ICD-10-CM | POA: Diagnosis not present

## 2021-01-16 DIAGNOSIS — E860 Dehydration: Secondary | ICD-10-CM | POA: Diagnosis not present

## 2021-01-16 DIAGNOSIS — R634 Abnormal weight loss: Secondary | ICD-10-CM | POA: Diagnosis not present

## 2021-01-16 DIAGNOSIS — J849 Interstitial pulmonary disease, unspecified: Secondary | ICD-10-CM | POA: Diagnosis not present

## 2021-01-16 DIAGNOSIS — Z20822 Contact with and (suspected) exposure to covid-19: Secondary | ICD-10-CM | POA: Diagnosis not present

## 2021-01-16 DIAGNOSIS — K8020 Calculus of gallbladder without cholecystitis without obstruction: Secondary | ICD-10-CM | POA: Diagnosis present

## 2021-01-16 DIAGNOSIS — R1110 Vomiting, unspecified: Secondary | ICD-10-CM | POA: Diagnosis present

## 2021-01-16 DIAGNOSIS — Z6827 Body mass index (BMI) 27.0-27.9, adult: Secondary | ICD-10-CM

## 2021-01-16 DIAGNOSIS — R112 Nausea with vomiting, unspecified: Secondary | ICD-10-CM | POA: Diagnosis not present

## 2021-01-16 DIAGNOSIS — R748 Abnormal levels of other serum enzymes: Secondary | ICD-10-CM | POA: Diagnosis present

## 2021-01-16 DIAGNOSIS — Z820 Family history of epilepsy and other diseases of the nervous system: Secondary | ICD-10-CM | POA: Diagnosis not present

## 2021-01-16 DIAGNOSIS — Z905 Acquired absence of kidney: Secondary | ICD-10-CM

## 2021-01-16 DIAGNOSIS — K838 Other specified diseases of biliary tract: Secondary | ICD-10-CM | POA: Diagnosis not present

## 2021-01-16 DIAGNOSIS — N1832 Chronic kidney disease, stage 3b: Secondary | ICD-10-CM | POA: Diagnosis present

## 2021-01-16 DIAGNOSIS — I7121 Aneurysm of the ascending aorta, without rupture: Secondary | ICD-10-CM | POA: Diagnosis not present

## 2021-01-16 DIAGNOSIS — K810 Acute cholecystitis: Principal | ICD-10-CM | POA: Diagnosis present

## 2021-01-16 DIAGNOSIS — J479 Bronchiectasis, uncomplicated: Principal | ICD-10-CM | POA: Diagnosis present

## 2021-01-16 DIAGNOSIS — Z7902 Long term (current) use of antithrombotics/antiplatelets: Secondary | ICD-10-CM | POA: Diagnosis not present

## 2021-01-16 DIAGNOSIS — F419 Anxiety disorder, unspecified: Secondary | ICD-10-CM | POA: Diagnosis present

## 2021-01-16 DIAGNOSIS — I10 Essential (primary) hypertension: Secondary | ICD-10-CM | POA: Diagnosis not present

## 2021-01-16 DIAGNOSIS — E785 Hyperlipidemia, unspecified: Secondary | ICD-10-CM | POA: Diagnosis present

## 2021-01-16 DIAGNOSIS — Z886 Allergy status to analgesic agent status: Secondary | ICD-10-CM

## 2021-01-16 DIAGNOSIS — R110 Nausea: Secondary | ICD-10-CM | POA: Diagnosis not present

## 2021-01-16 DIAGNOSIS — J439 Emphysema, unspecified: Secondary | ICD-10-CM | POA: Diagnosis not present

## 2021-01-16 DIAGNOSIS — I252 Old myocardial infarction: Secondary | ICD-10-CM

## 2021-01-16 DIAGNOSIS — R935 Abnormal findings on diagnostic imaging of other abdominal regions, including retroperitoneum: Secondary | ICD-10-CM | POA: Diagnosis not present

## 2021-01-16 DIAGNOSIS — F32A Depression, unspecified: Secondary | ICD-10-CM | POA: Diagnosis present

## 2021-01-16 DIAGNOSIS — R531 Weakness: Secondary | ICD-10-CM | POA: Diagnosis not present

## 2021-01-16 DIAGNOSIS — N189 Chronic kidney disease, unspecified: Secondary | ICD-10-CM | POA: Diagnosis present

## 2021-01-16 DIAGNOSIS — K811 Chronic cholecystitis: Secondary | ICD-10-CM | POA: Diagnosis not present

## 2021-01-16 DIAGNOSIS — Z5329 Procedure and treatment not carried out because of patient's decision for other reasons: Secondary | ICD-10-CM | POA: Diagnosis not present

## 2021-01-16 DIAGNOSIS — R6339 Other feeding difficulties: Secondary | ICD-10-CM | POA: Diagnosis present

## 2021-01-16 DIAGNOSIS — Z87891 Personal history of nicotine dependence: Secondary | ICD-10-CM

## 2021-01-16 DIAGNOSIS — Z8719 Personal history of other diseases of the digestive system: Secondary | ICD-10-CM | POA: Diagnosis not present

## 2021-01-16 DIAGNOSIS — R109 Unspecified abdominal pain: Secondary | ICD-10-CM

## 2021-01-16 DIAGNOSIS — R911 Solitary pulmonary nodule: Secondary | ICD-10-CM | POA: Diagnosis not present

## 2021-01-16 DIAGNOSIS — I2583 Coronary atherosclerosis due to lipid rich plaque: Secondary | ICD-10-CM

## 2021-01-16 DIAGNOSIS — J471 Bronchiectasis with (acute) exacerbation: Secondary | ICD-10-CM | POA: Diagnosis not present

## 2021-01-16 DIAGNOSIS — Z79899 Other long term (current) drug therapy: Secondary | ICD-10-CM

## 2021-01-16 DIAGNOSIS — R1084 Generalized abdominal pain: Secondary | ICD-10-CM | POA: Diagnosis not present

## 2021-01-16 DIAGNOSIS — I700 Atherosclerosis of aorta: Secondary | ICD-10-CM | POA: Diagnosis not present

## 2021-01-16 LAB — URINALYSIS, ROUTINE W REFLEX MICROSCOPIC
Bilirubin Urine: NEGATIVE
Glucose, UA: NEGATIVE mg/dL
Hgb urine dipstick: NEGATIVE
Ketones, ur: 5 mg/dL — AB
Leukocytes,Ua: NEGATIVE
Nitrite: NEGATIVE
Protein, ur: 30 mg/dL — AB
Specific Gravity, Urine: >1.046 — ABNORMAL HIGH (ref 1.005–1.030)
pH: 5 (ref 5.0–8.0)

## 2021-01-16 LAB — CBC
HCT: 39.1 % (ref 39.0–52.0)
Hemoglobin: 12.7 g/dL — ABNORMAL LOW (ref 13.0–17.0)
MCH: 29.4 pg (ref 26.0–34.0)
MCHC: 32.5 g/dL (ref 30.0–36.0)
MCV: 90.5 fL (ref 80.0–100.0)
Platelets: 340 10*3/uL (ref 150–400)
RBC: 4.32 MIL/uL (ref 4.22–5.81)
RDW: 14.6 % (ref 11.5–15.5)
WBC: 15.6 10*3/uL — ABNORMAL HIGH (ref 4.0–10.5)
nRBC: 0 % (ref 0.0–0.2)

## 2021-01-16 LAB — CBC WITH DIFFERENTIAL/PLATELET
Abs Immature Granulocytes: 0.09 10*3/uL — ABNORMAL HIGH (ref 0.00–0.07)
Basophils Absolute: 0.1 10*3/uL (ref 0.0–0.1)
Basophils Relative: 0 %
Eosinophils Absolute: 0.1 10*3/uL (ref 0.0–0.5)
Eosinophils Relative: 1 %
HCT: 44.4 % (ref 39.0–52.0)
Hemoglobin: 14.3 g/dL (ref 13.0–17.0)
Immature Granulocytes: 1 %
Lymphocytes Relative: 23 %
Lymphs Abs: 3.8 10*3/uL (ref 0.7–4.0)
MCH: 29.3 pg (ref 26.0–34.0)
MCHC: 32.2 g/dL (ref 30.0–36.0)
MCV: 91 fL (ref 80.0–100.0)
Monocytes Absolute: 1.5 10*3/uL — ABNORMAL HIGH (ref 0.1–1.0)
Monocytes Relative: 9 %
Neutro Abs: 11.1 10*3/uL — ABNORMAL HIGH (ref 1.7–7.7)
Neutrophils Relative %: 66 %
Platelets: 459 10*3/uL — ABNORMAL HIGH (ref 150–400)
RBC: 4.88 MIL/uL (ref 4.22–5.81)
RDW: 14.6 % (ref 11.5–15.5)
WBC: 16.6 10*3/uL — ABNORMAL HIGH (ref 4.0–10.5)
nRBC: 0 % (ref 0.0–0.2)

## 2021-01-16 LAB — RESP PANEL BY RT-PCR (FLU A&B, COVID) ARPGX2
Influenza A by PCR: NEGATIVE
Influenza B by PCR: NEGATIVE
SARS Coronavirus 2 by RT PCR: NEGATIVE

## 2021-01-16 LAB — COMPREHENSIVE METABOLIC PANEL
ALT: 34 U/L (ref 0–44)
AST: 31 U/L (ref 15–41)
Albumin: 3.2 g/dL — ABNORMAL LOW (ref 3.5–5.0)
Alkaline Phosphatase: 164 U/L — ABNORMAL HIGH (ref 38–126)
Anion gap: 13 (ref 5–15)
BUN: 17 mg/dL (ref 8–23)
CO2: 22 mmol/L (ref 22–32)
Calcium: 9.2 mg/dL (ref 8.9–10.3)
Chloride: 101 mmol/L (ref 98–111)
Creatinine, Ser: 1.78 mg/dL — ABNORMAL HIGH (ref 0.61–1.24)
GFR, Estimated: 41 mL/min — ABNORMAL LOW (ref >60)
Glucose, Bld: 136 mg/dL — ABNORMAL HIGH (ref 70–99)
Potassium: 3.7 mmol/L (ref 3.5–5.1)
Sodium: 136 mmol/L (ref 135–145)
Total Bilirubin: 1.2 mg/dL (ref 0.3–1.2)
Total Protein: 7.6 g/dL (ref 6.5–8.1)

## 2021-01-16 LAB — LIPASE, BLOOD: Lipase: 43 U/L (ref 11–51)

## 2021-01-16 LAB — TROPONIN I (HIGH SENSITIVITY)
Troponin I (High Sensitivity): 25 ng/L — ABNORMAL HIGH (ref <18)
Troponin I (High Sensitivity): 27 ng/L — ABNORMAL HIGH (ref <18)

## 2021-01-16 LAB — LACTIC ACID, PLASMA: Lactic Acid, Venous: 2.3 mmol/L (ref 0.5–1.9)

## 2021-01-16 LAB — HIV ANTIBODY (ROUTINE TESTING W REFLEX): HIV Screen 4th Generation wRfx: NONREACTIVE

## 2021-01-16 MED ORDER — LACTATED RINGERS IV SOLN: INTRAVENOUS | Status: DC

## 2021-01-16 MED ORDER — PIPERACILLIN-TAZOBACTAM 3.375 G IVPB
3.3750 g | Freq: Three times a day (TID) | INTRAVENOUS | Status: DC
  Administered 2021-01-17: 3.375 g via INTRAVENOUS
  Filled 2021-01-16: qty 50

## 2021-01-16 MED ORDER — SODIUM CHLORIDE 0.9 % IV BOLUS
500.0000 mL | Freq: Once | INTRAVENOUS | Status: AC
  Administered 2021-01-16: 500 mL via INTRAVENOUS

## 2021-01-16 MED ORDER — LACTATED RINGERS IV BOLUS
2000.0000 mL | Freq: Once | INTRAVENOUS | Status: AC
  Administered 2021-01-16: 2000 mL via INTRAVENOUS

## 2021-01-16 MED ORDER — PIPERACILLIN-TAZOBACTAM 3.375 G IVPB 30 MIN
3.3750 g | Freq: Once | INTRAVENOUS | Status: AC
  Administered 2021-01-16: 3.375 g via INTRAVENOUS
  Filled 2021-01-16: qty 50

## 2021-01-16 MED ORDER — IOHEXOL 300 MG/ML  SOLN
80.0000 mL | Freq: Once | INTRAMUSCULAR | Status: AC | PRN
  Administered 2021-01-16: 80 mL via INTRAVENOUS

## 2021-01-16 NOTE — ED Provider Notes (Signed)
  Face-to-face evaluation   History: He presents for evaluation of 50 pound weight loss over 8 weeks without trying to lose weight.  He also had intermittent additional symptoms including anorexia, periods of abdominal pain, coughing, shortness of breath.  He is here with his daughter who helps to give history.  He stopped taking his Lasix and potassium because they seem to cause more abdominal pain.  He is not sure why he is on Lasix.  No known sick contacts.  Physical exam: Alert elderly male.  He appears somewhat depressed.  No respiratory distress.  Lungs clear anteriorly.  Heart regular rate and rhythm.  Abdomen soft and mild diffuse tenderness to palpation.  Medical screening examination/treatment/procedure(s) were conducted as a shared visit with non-physician practitioner(s) and myself.  I personally evaluated the patient during the encounter    Mancel Bale, MD 01/17/21 [phone]
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
## 2021-01-16 NOTE — ED Notes (Signed)
Pt became diaphoretic, dizzy, pale and tachycardic. Pt reclined in chair

## 2021-01-16 NOTE — Progress Notes (Signed)
Pharmacy Antibiotic Note  Brendan Butler is a 70 y.o. male admitted on 01/16/2021 with  IAI .  CT showed right pericolic abscess, going for IR drainage likely. Pharmacy has been consulted for zosyn dosing.  Plan: Zosyn 3.375g IV q8h (4 hour infusion). -Monitor renal function, clinical status, and antibiotic plan  Height: 5\' 10"  (177.8 cm) Weight: 86.2 kg (190 lb) IBW/kg (Calculated) : 73  Temp (24hrs), Avg:98.4 F (36.9 C), Min:98.4 F (36.9 C), Max:98.4 F (36.9 C)  Recent Labs  Lab 01/16/21 1208 01/16/21 1214  WBC  --  16.6*  CREATININE 1.78*  --     Estimated Creatinine Clearance: 41.6 mL/min (A) (by C-G formula based on SCr of 1.78 mg/dL (H)).    Allergies  Allergen Reactions   Levitra [Vardenafil] Other (See Comments)    Blurred vision   Aspirin Hives   Clopidogrel Other (See Comments)    Not effective anti platelet med for patient-switched to effient   Lipitor [Atorvastatin] Other (See Comments)    Muscle cramps; patient states he does not take medication at home    Antimicrobials this admission: Zosyn 11/21 >>   Dose adjustments this admission: N/A  Microbiology results: 11/21 BCx:  11/21 UCx:    Thank you for allowing pharmacy to be a part of this patient's care.  12/21, PharmD, [facility] Emergency Medicine Clinical Pharmacist ED RPh Phone: [phone] Main RX: [phone]
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
## 2021-01-16 NOTE — ED Provider Notes (Signed)
Emergency Medicine Provider Triage Evaluation Note  Brendan Butler , a 70 y.o. male  was evaluated in triage.  Pt complains of shortness of breath, nausea, weakness.  For about 8 weeks he had decreased intake secondary to nausea, woke up with some epigastric pain this morning.  Denies any chest pain but feels very short of breath.  Diaphoretic, history of 3 prior MIs.    Review of Systems  Positive: Nausea, shortness of breath, weakness, nausea Negative: CP  Physical Exam  BP (!) 138/98 (BP Location: Left Arm)   Pulse (!) 55   Temp 98.4 F (36.9 C)   Resp 17   SpO2 99%  Gen:   Awake, no distress pale, diaphoretic Resp:  Normal effort no tachypnea, lungs CTA MSK:   Moves extremities without difficulty  Other:  Abdomen is soft, not particularly tender  Medical Decision Making  Medically screening exam initiated at 12:14 PM.  Appropriate orders placed.  [name] was informed that the remainder of the evaluation will be completed by another provider, this initial triage assessment does not replace that evaluation, and the importance of remaining in the ED until their evaluation is complete.  Multiple complaints, added to troponin given he is somewhat diaphoretic in triage and has extensive cardiac history.  Abdominal labs ordered given the nausea and epigastric pain started this morning.   Theron Arista, PA-C 01/16/21 1216    Mancel Bale, MD 01/16/21 [phone]

## 2021-01-16 NOTE — ED Provider Notes (Signed)
Physical Exam  BP (!) 166/98   Pulse (!) 109   Temp 98.4 F (36.9 C)   Resp 15   Ht 5\' 10"  (1.778 m)   Wt 86.2 kg   SpO2 97%   BMI 27.26 kg/m     ED Course/Procedures   Clinical Course as of 01/16/21 2036  Mon Jan 16, 2021  1335 Case discussed with Attending. Attending in to evaluate patient.  [SB]  1444 Patient re-evaluated, sitting up comfortably on stretcher. He notes to be feeling better. Discussed lab/imaging findings thus far with patient and daughter. Answered questions. Will continue to update. [SB]  1750 Discussed CT findings with attending, who recommends IV zosyn, IR consult, and admission to hospital. [SB]  1757 Patient re-evaluated. Resting comfortably on stretcher. Discussed treatment plan, patient agreeable. [SB]  1825 Jan 18, 2021, PA-C consult to IR, IR recommended admission to hospital and consult in the AM [SB]  1848 Fayrene Helper, PA-C consult to Hospitalist, Dr. Fayrene Helper Hospitalist recommends admission. [SB]    Clinical Course User Index [SB] Blue, [place]ijett A, PA-C    Procedures  MDM     70 year old male presenting with a 50 pound weight loss over the past 8 weeks.  Has had periods of shortness of breath and a cough, lungs clear to auscultation bilaterally, does have a remote smoking history and quit 10 years ago.  Has been having abdominal pain intermittently with symptoms of anorexia.  CT of the abdomen pelvis revealed an intra-abdominal abscess and inflammation surrounding the patient's gallbladder. IR consulted due to intraabdominal abscess and will evaluate the patient in the morning.      Received the patient in signout at 1900.  Patient meets SIRS criteria with a leukocytosis and tachycardia.  IV Zosyn administered.  Will administer 2 L IV fluids to complete a 30 cc/kg IV fluid bolus.  Right upper quadrant ultrasound ordered and resulted positive for acute cholecystitis.  Suspect acute cholecystitis possibly seeding the peritoneum resulting in intra-abdominal  abscess.  General surgery consulted for recommendations/management, will evaluate the patient once admitted, recommend keeping the patient n.p.o. for possible cholecystectomy. Given the patient's respiratory complaints, smoking hx, and weight loss, CT chest also ordered. Hospitalist medicine updated and patient was admitted in stable condition.     CRITICAL CARE Performed by: 79     Total critical care time: 36 minutes   Critical care time was exclusive of separately billable procedures and treating other patients. Critical care billed for: Sepsis, SIRS criteria plus known source of infection.   Critical care was necessary to treat or prevent imminent or life-threatening deterioration.   Critical care was time spent personally by me on the following activities: development of treatment plan with patient and/or surrogate as well as nursing, discussions with consultants, evaluation of patient's response to treatment, examination of patient, obtaining history from patient or surrogate, ordering and performing treatments and interventions, ordering and review of laboratory studies, ordering and review of radiographic studies, pulse oximetry and re-evaluation of patient's condition.     EKG Interpretation   Date/Time:                  Monday January 16 2021 13:01:35 EST Ventricular Rate:         99 PR Interval:                 165 QRS Duration: 89 QT Interval:                 351 QTC  Calculation:        451 R Axis:                         8 Text Interpretation:      Sinus rhythm Left ventricular hypertrophy Since last tracing new criteria for LVH Otherwise no significant change Confirmed by Mancel Bale [phone]) on 01/16/2021 1:26:40 PM      Ernie Avena, MD 01/16/21 2049
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
## 2021-01-16 NOTE — ED Triage Notes (Signed)
Patient complains of 8 weeks of not feeling well with nausea with any solid intake. Has been drinking fluids. Reports considerable amount of weight loss. Patient also reports some recent chills, fever and cough with same, non-smoker. Patient pale, alert and oriented. Denies abd. pain

## 2021-01-17 ENCOUNTER — Inpatient Hospital Stay (HOSPITAL_COMMUNITY): Payer: Medicare HMO

## 2021-01-17 DIAGNOSIS — N179 Acute kidney failure, unspecified: Secondary | ICD-10-CM | POA: Diagnosis not present

## 2021-01-17 DIAGNOSIS — R911 Solitary pulmonary nodule: Secondary | ICD-10-CM | POA: Diagnosis present

## 2021-01-17 DIAGNOSIS — N1831 Chronic kidney disease, stage 3a: Secondary | ICD-10-CM | POA: Diagnosis present

## 2021-01-17 DIAGNOSIS — N1832 Chronic kidney disease, stage 3b: Secondary | ICD-10-CM | POA: Diagnosis present

## 2021-01-17 DIAGNOSIS — K811 Chronic cholecystitis: Secondary | ICD-10-CM | POA: Diagnosis not present

## 2021-01-17 DIAGNOSIS — I7121 Aneurysm of the ascending aorta, without rupture: Secondary | ICD-10-CM | POA: Diagnosis not present

## 2021-01-17 DIAGNOSIS — J479 Bronchiectasis, uncomplicated: Secondary | ICD-10-CM

## 2021-01-17 DIAGNOSIS — R634 Abnormal weight loss: Secondary | ICD-10-CM | POA: Diagnosis present

## 2021-01-17 DIAGNOSIS — K651 Peritoneal abscess: Secondary | ICD-10-CM | POA: Diagnosis present

## 2021-01-17 DIAGNOSIS — J471 Bronchiectasis with (acute) exacerbation: Secondary | ICD-10-CM

## 2021-01-17 DIAGNOSIS — N183 Chronic kidney disease, stage 3 unspecified: Secondary | ICD-10-CM

## 2021-01-17 DIAGNOSIS — N189 Chronic kidney disease, unspecified: Secondary | ICD-10-CM | POA: Diagnosis present

## 2021-01-17 LAB — BASIC METABOLIC PANEL
Anion gap: 9 (ref 5–15)
BUN: 16 mg/dL (ref 8–23)
CO2: 23 mmol/L (ref 22–32)
Calcium: 8.5 mg/dL — ABNORMAL LOW (ref 8.9–10.3)
Chloride: 106 mmol/L (ref 98–111)
Creatinine, Ser: 1.5 mg/dL — ABNORMAL HIGH (ref 0.61–1.24)
GFR, Estimated: 51 mL/min — ABNORMAL LOW (ref >60)
Glucose, Bld: 79 mg/dL (ref 70–99)
Potassium: 3.5 mmol/L (ref 3.5–5.1)
Sodium: 138 mmol/L (ref 135–145)

## 2021-01-17 LAB — URINE CULTURE: Culture: NO GROWTH

## 2021-01-17 LAB — LACTIC ACID, PLASMA: Lactic Acid, Venous: 1 mmol/L (ref 0.5–1.9)

## 2021-01-17 MED ORDER — SODIUM CHLORIDE 0.9 % IV SOLN
2.0000 g | INTRAVENOUS | Status: DC
  Administered 2021-01-17: 2 g via INTRAVENOUS
  Filled 2021-01-17: qty 20

## 2021-01-17 MED ORDER — BUPROPION HCL ER (XL) 150 MG PO TB24
450.0000 mg | ORAL_TABLET | Freq: Every day | ORAL | Status: DC
  Administered 2021-01-17: 450 mg via ORAL
  Filled 2021-01-17: qty 1
  Filled 2021-01-17: qty 3

## 2021-01-17 MED ORDER — METRONIDAZOLE 500 MG/100ML IV SOLN
500.0000 mg | Freq: Two times a day (BID) | INTRAVENOUS | Status: DC
  Administered 2021-01-17: 500 mg via INTRAVENOUS
  Filled 2021-01-17: qty 100

## 2021-01-17 MED ORDER — TECHNETIUM TC 99M MEBROFENIN IV KIT
4.9000 | PACK | Freq: Once | INTRAVENOUS | Status: AC | PRN
  Administered 2021-01-17: 4.9 via INTRAVENOUS

## 2021-01-17 MED ORDER — PAROXETINE HCL 30 MG PO TABS
30.0000 mg | ORAL_TABLET | Freq: Every day | ORAL | Status: DC
  Administered 2021-01-17: 30 mg via ORAL
  Filled 2021-01-17: qty 1

## 2021-01-17 MED ORDER — LABETALOL HCL 5 MG/ML IV SOLN: 5.0000 mg | INTRAVENOUS | Status: DC | PRN

## 2021-01-17 MED ORDER — ENOXAPARIN SODIUM 40 MG/0.4ML IJ SOSY
40.0000 mg | PREFILLED_SYRINGE | INTRAMUSCULAR | Status: DC
  Filled 2021-01-17: qty 0.4

## 2021-01-17 MED ORDER — PRASUGREL HCL 10 MG PO TABS
10.0000 mg | ORAL_TABLET | Freq: Every day | ORAL | Status: DC
  Administered 2021-01-17: 10 mg via ORAL
  Filled 2021-01-17: qty 1

## 2021-01-17 MED ORDER — ROSUVASTATIN CALCIUM 5 MG PO TABS
10.0000 mg | ORAL_TABLET | Freq: Every day | ORAL | Status: DC
  Administered 2021-01-17: 10 mg via ORAL
  Filled 2021-01-17: qty 2

## 2021-01-17 NOTE — ED Notes (Signed)
Pt is not clean yet. EVS already called to clean the room.
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
## 2021-01-17 NOTE — Progress Notes (Signed)
Pt refused to sign AMA paperwork, stating that he does not have to sign anything that he does not want to.
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
## 2021-01-17 NOTE — ED Notes (Signed)
Pt called out to nurse's station requesting emesis bag to spit in. Upon entering room pt had one in his hand and requested repositioning.
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
## 2021-01-17 NOTE — ED Notes (Signed)
I entered pt's room to get a set of VS prior to transporting upstairs to in-patient room. Pt was disconnected from wires, standing beside bed. I attempted to redirect pt to bed and encourage him that he was getting a more comfortable bed with pillows upstairs. He again began curing at me stating "I ain't goin' nowhere until I see a doctor, I don't care if I lose the room. I'm going to complain on you as much as I can". Again, pt unable to be verbally de-escalated. MD notified.
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
## 2021-01-17 NOTE — ED Notes (Addendum)
Pt called to nurse's station and upon entering room pt began swearing at me stating "I'm tired of your shit". Verbal de-escalation attempted, but pt continued berating nurse. Pt stating "I'd like to know who to talk to to file a complaint on your ass". Consulting civil engineer notified

## 2021-01-17 NOTE — ED Notes (Signed)
Assumed care from [facility]. Pt A+Ox4, seated in bed comfortably. NAD. Pt denies needs at this time.

## 2021-01-21 LAB — CULTURE, BLOOD (ROUTINE X 2)
Culture: NO GROWTH
Special Requests: ADEQUATE

## 2021-01-22 LAB — CULTURE, BLOOD (ROUTINE X 2)
Culture: NO GROWTH
Special Requests: ADEQUATE

## 2021-01-25 DIAGNOSIS — I10 Essential (primary) hypertension: Secondary | ICD-10-CM | POA: Diagnosis not present

## 2021-01-25 DIAGNOSIS — I700 Atherosclerosis of aorta: Secondary | ICD-10-CM | POA: Diagnosis not present

## 2021-01-25 DIAGNOSIS — K668 Other specified disorders of peritoneum: Secondary | ICD-10-CM | POA: Diagnosis not present

## 2021-01-25 DIAGNOSIS — J439 Emphysema, unspecified: Secondary | ICD-10-CM | POA: Diagnosis not present

## 2021-01-25 DIAGNOSIS — R911 Solitary pulmonary nodule: Secondary | ICD-10-CM | POA: Diagnosis not present

## 2021-01-25 DIAGNOSIS — J479 Bronchiectasis, uncomplicated: Secondary | ICD-10-CM | POA: Diagnosis not present

## 2021-01-25 DIAGNOSIS — J869 Pyothorax without fistula: Secondary | ICD-10-CM | POA: Diagnosis not present

## 2021-02-10 DIAGNOSIS — N183 Chronic kidney disease, stage 3 unspecified: Secondary | ICD-10-CM | POA: Diagnosis not present

## 2021-02-10 DIAGNOSIS — I10 Essential (primary) hypertension: Secondary | ICD-10-CM | POA: Diagnosis not present

## 2021-02-10 DIAGNOSIS — Z1211 Encounter for screening for malignant neoplasm of colon: Secondary | ICD-10-CM | POA: Diagnosis not present

## 2021-02-10 DIAGNOSIS — J479 Bronchiectasis, uncomplicated: Secondary | ICD-10-CM | POA: Diagnosis not present

## 2021-03-20 DIAGNOSIS — G629 Polyneuropathy, unspecified: Secondary | ICD-10-CM | POA: Diagnosis not present

## 2021-03-20 DIAGNOSIS — F331 Major depressive disorder, recurrent, moderate: Secondary | ICD-10-CM | POA: Diagnosis not present

## 2021-03-20 DIAGNOSIS — I2510 Atherosclerotic heart disease of native coronary artery without angina pectoris: Secondary | ICD-10-CM | POA: Diagnosis not present

## 2021-03-20 DIAGNOSIS — I10 Essential (primary) hypertension: Secondary | ICD-10-CM | POA: Diagnosis not present

## 2021-03-21 ENCOUNTER — Institutional Professional Consult (permissible substitution): Payer: Medicare HMO | Admitting: Pulmonary Disease

## 2021-06-20 DIAGNOSIS — N183 Chronic kidney disease, stage 3 unspecified: Secondary | ICD-10-CM | POA: Diagnosis not present

## 2021-06-20 DIAGNOSIS — I700 Atherosclerosis of aorta: Secondary | ICD-10-CM | POA: Diagnosis not present

## 2021-06-20 DIAGNOSIS — Z125 Encounter for screening for malignant neoplasm of prostate: Secondary | ICD-10-CM | POA: Diagnosis not present

## 2021-06-20 DIAGNOSIS — E7800 Pure hypercholesterolemia, unspecified: Secondary | ICD-10-CM | POA: Diagnosis not present

## 2021-06-20 DIAGNOSIS — J439 Emphysema, unspecified: Secondary | ICD-10-CM | POA: Diagnosis not present

## 2021-06-20 DIAGNOSIS — R7303 Prediabetes: Secondary | ICD-10-CM | POA: Diagnosis not present

## 2021-06-20 DIAGNOSIS — G629 Polyneuropathy, unspecified: Secondary | ICD-10-CM | POA: Diagnosis not present

## 2021-06-20 DIAGNOSIS — I2510 Atherosclerotic heart disease of native coronary artery without angina pectoris: Secondary | ICD-10-CM | POA: Diagnosis not present

## 2021-06-20 DIAGNOSIS — F331 Major depressive disorder, recurrent, moderate: Secondary | ICD-10-CM | POA: Diagnosis not present

## 2021-06-20 DIAGNOSIS — J869 Pyothorax without fistula: Secondary | ICD-10-CM | POA: Diagnosis not present

## 2021-06-20 DIAGNOSIS — I10 Essential (primary) hypertension: Secondary | ICD-10-CM | POA: Diagnosis not present

## 2021-06-20 DIAGNOSIS — J479 Bronchiectasis, uncomplicated: Secondary | ICD-10-CM | POA: Diagnosis not present

## 2021-07-26 DIAGNOSIS — Z0000 Encounter for general adult medical examination without abnormal findings: Secondary | ICD-10-CM | POA: Diagnosis not present

## 2021-07-26 DIAGNOSIS — F331 Major depressive disorder, recurrent, moderate: Secondary | ICD-10-CM | POA: Diagnosis not present

## 2021-10-27 ENCOUNTER — Other Ambulatory Visit: Payer: Self-pay | Admitting: Internal Medicine

## 2021-10-27 ENCOUNTER — Ambulatory Visit (HOSPITAL_COMMUNITY)
Admission: RE | Admit: 2021-10-27 | Discharge: 2021-10-27 | Disposition: A | Discharge status: Home / Self Care | Payer: Medicare HMO | Source: Ambulatory Visit | Attending: Internal Medicine | Admitting: Internal Medicine

## 2021-10-27 ENCOUNTER — Other Ambulatory Visit (HOSPITAL_COMMUNITY): Payer: Self-pay | Admitting: Internal Medicine

## 2021-10-27 DIAGNOSIS — K8020 Calculus of gallbladder without cholecystitis without obstruction: Secondary | ICD-10-CM | POA: Diagnosis not present

## 2021-10-27 DIAGNOSIS — I10 Essential (primary) hypertension: Secondary | ICD-10-CM | POA: Diagnosis not present

## 2021-10-27 DIAGNOSIS — R109 Unspecified abdominal pain: Secondary | ICD-10-CM | POA: Diagnosis not present

## 2021-10-27 DIAGNOSIS — G629 Polyneuropathy, unspecified: Secondary | ICD-10-CM | POA: Diagnosis not present

## 2021-10-27 DIAGNOSIS — K760 Fatty (change of) liver, not elsewhere classified: Secondary | ICD-10-CM | POA: Diagnosis not present

## 2021-11-09 DIAGNOSIS — K8010 Calculus of gallbladder with chronic cholecystitis without obstruction: Secondary | ICD-10-CM | POA: Diagnosis not present

## 2021-11-09 DIAGNOSIS — I2510 Atherosclerotic heart disease of native coronary artery without angina pectoris: Secondary | ICD-10-CM | POA: Diagnosis not present

## 2021-12-07 ENCOUNTER — Telehealth: Payer: Self-pay

## 2021-12-07 NOTE — Telephone Encounter (Signed)
   Pre-operative Risk Assessment    Patient Name: Brendan Butler  DOB: 1950/07/08 MRN: [id number]      Request for Surgical Clearance    Procedure:   Laparoscopic Cholecystomy   Date of Surgery:  Clearance TBD                                 Surgeon:  Dr. Gurney Maxin Surgeon's Group or Practice Name:  [facility] Phone number:  [phone] Fax number:  [phone]   Type of Clearance Requested:   - Medical    Type of Anesthesia:  General    Additional requests/questions:    SignedJacqulynn Cadet   12/07/2021, 5:55 PM

## 2021-12-11 NOTE — Telephone Encounter (Signed)
   Name: Brendan Butler  DOB: [birth year]  MRN: [id number]  Primary Cardiologist: None  Chart reviewed as part of pre-operative protocol coverage. Because of [name]'s past medical history and time since last visit, he will require a follow-up in-office visit in order to better assess preoperative cardiovascular risk.  Pre-op covering staff: - Please schedule appointment and call patient to inform them. If patient already had an upcoming appointment within acceptable timeframe, please add "pre-op clearance" to the appointment notes so provider is aware. - Please contact requesting surgeon's office via preferred method (i.e, phone, fax) to inform them of need for appointment prior to surgery.  Lenna Sciara, NP  12/11/2021, 4:49 PM

## 2021-12-13 NOTE — Telephone Encounter (Signed)
I s/w the pt and he tells me that he has cancelled his surgery for his cholecystectomy at this time. I will update the pre op provider and send FYI to requesting office as well.

## 2021-12-24 ENCOUNTER — Emergency Department (HOSPITAL_COMMUNITY): Payer: Medicare HMO

## 2021-12-24 ENCOUNTER — Other Ambulatory Visit: Payer: Self-pay

## 2021-12-24 ENCOUNTER — Inpatient Hospital Stay (HOSPITAL_COMMUNITY)
Admission: EM | Admit: 2021-12-24 | Discharge: 2021-12-28 | DRG: 418 | Disposition: A | Discharge status: Home / Self Care | Payer: Medicare HMO | Attending: Internal Medicine | Admitting: Internal Medicine

## 2021-12-24 DIAGNOSIS — J849 Interstitial pulmonary disease, unspecified: Secondary | ICD-10-CM | POA: Diagnosis present

## 2021-12-24 DIAGNOSIS — I5021 Acute systolic (congestive) heart failure: Secondary | ICD-10-CM | POA: Diagnosis not present

## 2021-12-24 DIAGNOSIS — K810 Acute cholecystitis: Secondary | ICD-10-CM | POA: Diagnosis present

## 2021-12-24 DIAGNOSIS — Z8674 Personal history of sudden cardiac arrest: Secondary | ICD-10-CM

## 2021-12-24 DIAGNOSIS — K8012 Calculus of gallbladder with acute and chronic cholecystitis without obstruction: Secondary | ICD-10-CM | POA: Diagnosis not present

## 2021-12-24 DIAGNOSIS — E876 Hypokalemia: Secondary | ICD-10-CM | POA: Diagnosis present

## 2021-12-24 DIAGNOSIS — I2510 Atherosclerotic heart disease of native coronary artery without angina pectoris: Secondary | ICD-10-CM | POA: Diagnosis present

## 2021-12-24 DIAGNOSIS — R109 Unspecified abdominal pain: Secondary | ICD-10-CM | POA: Diagnosis not present

## 2021-12-24 DIAGNOSIS — I700 Atherosclerosis of aorta: Secondary | ICD-10-CM | POA: Diagnosis not present

## 2021-12-24 DIAGNOSIS — F39 Unspecified mood [affective] disorder: Secondary | ICD-10-CM | POA: Diagnosis present

## 2021-12-24 DIAGNOSIS — Z9889 Other specified postprocedural states: Secondary | ICD-10-CM | POA: Diagnosis not present

## 2021-12-24 DIAGNOSIS — N1831 Chronic kidney disease, stage 3a: Secondary | ICD-10-CM | POA: Diagnosis present

## 2021-12-24 DIAGNOSIS — I130 Hypertensive heart and chronic kidney disease with heart failure and stage 1 through stage 4 chronic kidney disease, or unspecified chronic kidney disease: Secondary | ICD-10-CM | POA: Diagnosis present

## 2021-12-24 DIAGNOSIS — I10 Essential (primary) hypertension: Secondary | ICD-10-CM | POA: Diagnosis not present

## 2021-12-24 DIAGNOSIS — K760 Fatty (change of) liver, not elsewhere classified: Secondary | ICD-10-CM | POA: Diagnosis present

## 2021-12-24 DIAGNOSIS — J189 Pneumonia, unspecified organism: Secondary | ICD-10-CM | POA: Diagnosis not present

## 2021-12-24 DIAGNOSIS — N179 Acute kidney failure, unspecified: Secondary | ICD-10-CM | POA: Diagnosis present

## 2021-12-24 DIAGNOSIS — J479 Bronchiectasis, uncomplicated: Secondary | ICD-10-CM | POA: Diagnosis present

## 2021-12-24 DIAGNOSIS — Z888 Allergy status to other drugs, medicaments and biological substances status: Secondary | ICD-10-CM

## 2021-12-24 DIAGNOSIS — R55 Syncope and collapse: Secondary | ICD-10-CM | POA: Diagnosis not present

## 2021-12-24 DIAGNOSIS — R911 Solitary pulmonary nodule: Secondary | ICD-10-CM | POA: Diagnosis present

## 2021-12-24 DIAGNOSIS — I6381 Other cerebral infarction due to occlusion or stenosis of small artery: Secondary | ICD-10-CM | POA: Diagnosis not present

## 2021-12-24 DIAGNOSIS — J9811 Atelectasis: Secondary | ICD-10-CM | POA: Diagnosis present

## 2021-12-24 DIAGNOSIS — Z905 Acquired absence of kidney: Secondary | ICD-10-CM

## 2021-12-24 DIAGNOSIS — R918 Other nonspecific abnormal finding of lung field: Secondary | ICD-10-CM | POA: Diagnosis not present

## 2021-12-24 DIAGNOSIS — Z6831 Body mass index (BMI) 31.0-31.9, adult: Secondary | ICD-10-CM

## 2021-12-24 DIAGNOSIS — K8066 Calculus of gallbladder and bile duct with acute and chronic cholecystitis without obstruction: Secondary | ICD-10-CM | POA: Diagnosis not present

## 2021-12-24 DIAGNOSIS — I714 Abdominal aortic aneurysm, without rupture, unspecified: Secondary | ICD-10-CM | POA: Diagnosis present

## 2021-12-24 DIAGNOSIS — Z01810 Encounter for preprocedural cardiovascular examination: Secondary | ICD-10-CM | POA: Diagnosis not present

## 2021-12-24 DIAGNOSIS — Z955 Presence of coronary angioplasty implant and graft: Secondary | ICD-10-CM | POA: Diagnosis not present

## 2021-12-24 DIAGNOSIS — N189 Chronic kidney disease, unspecified: Secondary | ICD-10-CM | POA: Diagnosis present

## 2021-12-24 DIAGNOSIS — Z79899 Other long term (current) drug therapy: Secondary | ICD-10-CM

## 2021-12-24 DIAGNOSIS — K8000 Calculus of gallbladder with acute cholecystitis without obstruction: Secondary | ICD-10-CM | POA: Diagnosis present

## 2021-12-24 DIAGNOSIS — Z1152 Encounter for screening for COVID-19: Secondary | ICD-10-CM

## 2021-12-24 DIAGNOSIS — N1832 Chronic kidney disease, stage 3b: Secondary | ICD-10-CM | POA: Diagnosis present

## 2021-12-24 DIAGNOSIS — I25119 Atherosclerotic heart disease of native coronary artery with unspecified angina pectoris: Secondary | ICD-10-CM | POA: Diagnosis not present

## 2021-12-24 DIAGNOSIS — K819 Cholecystitis, unspecified: Secondary | ICD-10-CM | POA: Diagnosis not present

## 2021-12-24 DIAGNOSIS — K660 Peritoneal adhesions (postprocedural) (postinfection): Secondary | ICD-10-CM | POA: Diagnosis not present

## 2021-12-24 DIAGNOSIS — E785 Hyperlipidemia, unspecified: Secondary | ICD-10-CM | POA: Diagnosis present

## 2021-12-24 DIAGNOSIS — I5022 Chronic systolic (congestive) heart failure: Secondary | ICD-10-CM | POA: Diagnosis present

## 2021-12-24 DIAGNOSIS — Z87891 Personal history of nicotine dependence: Secondary | ICD-10-CM | POA: Diagnosis not present

## 2021-12-24 DIAGNOSIS — Z886 Allergy status to analgesic agent status: Secondary | ICD-10-CM | POA: Diagnosis not present

## 2021-12-24 DIAGNOSIS — I252 Old myocardial infarction: Secondary | ICD-10-CM

## 2021-12-24 DIAGNOSIS — E669 Obesity, unspecified: Secondary | ICD-10-CM | POA: Diagnosis present

## 2021-12-24 DIAGNOSIS — G4733 Obstructive sleep apnea (adult) (pediatric): Secondary | ICD-10-CM | POA: Diagnosis present

## 2021-12-24 DIAGNOSIS — R001 Bradycardia, unspecified: Secondary | ICD-10-CM | POA: Diagnosis present

## 2021-12-24 DIAGNOSIS — F17210 Nicotine dependence, cigarettes, uncomplicated: Secondary | ICD-10-CM | POA: Diagnosis present

## 2021-12-24 DIAGNOSIS — R846 Abnormal cytological findings in specimens from respiratory organs and thorax: Secondary | ICD-10-CM | POA: Diagnosis not present

## 2021-12-24 DIAGNOSIS — R4182 Altered mental status, unspecified: Secondary | ICD-10-CM | POA: Diagnosis not present

## 2021-12-24 DIAGNOSIS — K8020 Calculus of gallbladder without cholecystitis without obstruction: Secondary | ICD-10-CM | POA: Diagnosis not present

## 2021-12-24 LAB — COMPREHENSIVE METABOLIC PANEL
ALT: 19 U/L (ref 0–44)
AST: 24 U/L (ref 15–41)
Albumin: 3.7 g/dL (ref 3.5–5.0)
Alkaline Phosphatase: 98 U/L (ref 38–126)
Anion gap: 14 (ref 5–15)
BUN: 23 mg/dL (ref 8–23)
CO2: 20 mmol/L — ABNORMAL LOW (ref 22–32)
Calcium: 9.5 mg/dL (ref 8.9–10.3)
Chloride: 106 mmol/L (ref 98–111)
Creatinine, Ser: 1.81 mg/dL — ABNORMAL HIGH (ref 0.61–1.24)
GFR, Estimated: 40 mL/min — ABNORMAL LOW (ref >60)
Glucose, Bld: 144 mg/dL — ABNORMAL HIGH (ref 70–99)
Potassium: 3.1 mmol/L — ABNORMAL LOW (ref 3.5–5.1)
Sodium: 140 mmol/L (ref 135–145)
Total Bilirubin: 1.1 mg/dL (ref 0.3–1.2)
Total Protein: 7 g/dL (ref 6.5–8.1)

## 2021-12-24 LAB — CBC WITH DIFFERENTIAL/PLATELET
Abs Immature Granulocytes: 0.07 10*3/uL (ref 0.00–0.07)
Basophils Absolute: 0.1 10*3/uL (ref 0.0–0.1)
Basophils Relative: 1 %
Eosinophils Absolute: 0.1 10*3/uL (ref 0.0–0.5)
Eosinophils Relative: 1 %
HCT: 43 % (ref 39.0–52.0)
Hemoglobin: 15.1 g/dL (ref 13.0–17.0)
Immature Granulocytes: 1 %
Lymphocytes Relative: 29 %
Lymphs Abs: 4.4 10*3/uL — ABNORMAL HIGH (ref 0.7–4.0)
MCH: 31.8 pg (ref 26.0–34.0)
MCHC: 35.1 g/dL (ref 30.0–36.0)
MCV: 90.5 fL (ref 80.0–100.0)
Monocytes Absolute: 1.6 10*3/uL — ABNORMAL HIGH (ref 0.1–1.0)
Monocytes Relative: 11 %
Neutro Abs: 8.8 10*3/uL — ABNORMAL HIGH (ref 1.7–7.7)
Neutrophils Relative %: 57 %
Platelets: 374 10*3/uL (ref 150–400)
RBC: 4.75 MIL/uL (ref 4.22–5.81)
RDW: 12.5 % (ref 11.5–15.5)
WBC: 15 10*3/uL — ABNORMAL HIGH (ref 4.0–10.5)
nRBC: 0 % (ref 0.0–0.2)

## 2021-12-24 LAB — TROPONIN I (HIGH SENSITIVITY): Troponin I (High Sensitivity): 18 ng/L — ABNORMAL HIGH (ref <18)

## 2021-12-24 LAB — CBG MONITORING, ED: Glucose-Capillary: 148 mg/dL — ABNORMAL HIGH (ref 70–99)

## 2021-12-24 NOTE — ED Provider Triage Note (Signed)
Emergency Medicine Provider Triage Evaluation Note  Brendan Butler , a 71 y.o. male  was evaluated in triage.  Pt complains of possible altered mental status, syncopal episodes, tachycardia, hypertension.  Patient's daughter states the symptoms been ongoing off and on for the past week.  She states that the patient's son is talking on the phone and there have been some confusion and the patient has been unable to give good information.  He thinks his gallbladder is the issue and states that his primary care doctor has told him he needs gallbladder surgery.  He has no abdominal pain at this time.  Currently he complains of feeling hot.  He denies shortness of breath, chest pain, abdominal pain, nausea, vomiting.  Review of Systems  Positive: As above Negative: As above  Physical Exam  BP (!) 147/83 (BP Location: Right Arm)   Pulse 91   Temp (!) 97.3 F (36.3 C) (Oral)   Resp 16   SpO2 99%  Gen:   Awake, no distress   Resp:  Normal effort  MSK:   Moves extremities without difficulty  Other:    Medical Decision Making  Medically screening exam initiated at 9:20 PM.  Appropriate orders placed.  Marylene Buerger was informed that the remainder of the evaluation will be completed by another provider, this initial triage assessment does not replace that evaluation, and the importance of remaining in the ED until their evaluation is complete.     Ronny Bacon 12/24/21 2123

## 2021-12-24 NOTE — ED Triage Notes (Signed)
Pt here from home w/ daughter for intermittent confusion x1 week w/ multiple syncopal episodes followed by dizziness and diaphoresis. Pt had one in triage, became bradycardic, dizzy and diaphoretic. Pt aox2

## 2021-12-25 ENCOUNTER — Inpatient Hospital Stay (HOSPITAL_COMMUNITY): Payer: Medicare HMO

## 2021-12-25 ENCOUNTER — Emergency Department (HOSPITAL_COMMUNITY): Payer: Medicare HMO

## 2021-12-25 ENCOUNTER — Encounter (HOSPITAL_COMMUNITY): Payer: Self-pay | Admitting: Internal Medicine

## 2021-12-25 DIAGNOSIS — K760 Fatty (change of) liver, not elsewhere classified: Secondary | ICD-10-CM | POA: Diagnosis not present

## 2021-12-25 DIAGNOSIS — R001 Bradycardia, unspecified: Secondary | ICD-10-CM | POA: Diagnosis present

## 2021-12-25 DIAGNOSIS — K810 Acute cholecystitis: Secondary | ICD-10-CM | POA: Diagnosis present

## 2021-12-25 DIAGNOSIS — I2510 Atherosclerotic heart disease of native coronary artery without angina pectoris: Secondary | ICD-10-CM

## 2021-12-25 DIAGNOSIS — Z01810 Encounter for preprocedural cardiovascular examination: Secondary | ICD-10-CM | POA: Insufficient documentation

## 2021-12-25 DIAGNOSIS — R918 Other nonspecific abnormal finding of lung field: Secondary | ICD-10-CM | POA: Diagnosis not present

## 2021-12-25 DIAGNOSIS — K8020 Calculus of gallbladder without cholecystitis without obstruction: Secondary | ICD-10-CM | POA: Diagnosis not present

## 2021-12-25 DIAGNOSIS — R109 Unspecified abdominal pain: Secondary | ICD-10-CM | POA: Diagnosis not present

## 2021-12-25 DIAGNOSIS — K8000 Calculus of gallbladder with acute cholecystitis without obstruction: Secondary | ICD-10-CM | POA: Diagnosis not present

## 2021-12-25 HISTORY — DX: Acute cholecystitis: K81.0

## 2021-12-25 LAB — SURGICAL PCR SCREEN
MRSA, PCR: NEGATIVE
Staphylococcus aureus: NEGATIVE

## 2021-12-25 LAB — MAGNESIUM: Magnesium: 2.2 mg/dL (ref 1.7–2.4)

## 2021-12-25 LAB — TROPONIN I (HIGH SENSITIVITY): Troponin I (High Sensitivity): 17 ng/L (ref <18)

## 2021-12-25 MED ORDER — PAROXETINE HCL 30 MG PO TABS
30.0000 mg | ORAL_TABLET | Freq: Every day | ORAL | Status: DC
  Administered 2021-12-25 – 2021-12-28 (×3): 30 mg via ORAL
  Filled 2021-12-25 (×4): qty 1

## 2021-12-25 MED ORDER — HYDRALAZINE HCL 20 MG/ML IJ SOLN: 10.0000 mg | INTRAMUSCULAR | Status: DC | PRN

## 2021-12-25 MED ORDER — POTASSIUM CHLORIDE CRYS ER 20 MEQ PO TBCR
40.0000 meq | EXTENDED_RELEASE_TABLET | Freq: Once | ORAL | Status: AC
  Administered 2021-12-25: 40 meq via ORAL
  Filled 2021-12-25: qty 2

## 2021-12-25 MED ORDER — MORPHINE SULFATE (PF) 2 MG/ML IV SOLN: 2.0000 mg | INTRAVENOUS | Status: DC | PRN

## 2021-12-25 MED ORDER — LACTATED RINGERS IV SOLN: INTRAVENOUS | Status: DC

## 2021-12-25 MED ORDER — ACETAMINOPHEN 325 MG PO TABS: 650.0000 mg | ORAL_TABLET | Freq: Four times a day (QID) | ORAL | Status: DC | PRN

## 2021-12-25 MED ORDER — FLUTICASONE PROPIONATE 50 MCG/ACT NA SUSP
1.0000 | Freq: Every day | NASAL | Status: DC
  Administered 2021-12-25: 1 via NASAL
  Filled 2021-12-25: qty 16

## 2021-12-25 MED ORDER — FUROSEMIDE 20 MG PO TABS
20.0000 mg | ORAL_TABLET | Freq: Once | ORAL | Status: AC
  Administered 2021-12-25: 20 mg via ORAL
  Filled 2021-12-25: qty 1

## 2021-12-25 MED ORDER — ONDANSETRON HCL 4 MG/2ML IJ SOLN: 4.0000 mg | Freq: Four times a day (QID) | INTRAMUSCULAR | Status: DC | PRN

## 2021-12-25 MED ORDER — OXYCODONE HCL 5 MG PO TABS
5.0000 mg | ORAL_TABLET | ORAL | Status: DC | PRN
  Administered 2021-12-26: 10 mg via ORAL
  Administered 2021-12-27: 5 mg via ORAL
  Filled 2021-12-25: qty 1
  Filled 2021-12-25: qty 2

## 2021-12-25 MED ORDER — PRASUGREL HCL 10 MG PO TABS
10.0000 mg | ORAL_TABLET | Freq: Every day | ORAL | Status: DC
  Administered 2021-12-25: 10 mg via ORAL
  Filled 2021-12-25: qty 1

## 2021-12-25 MED ORDER — ONDANSETRON 4 MG PO TBDP: 4.0000 mg | ORAL_TABLET | Freq: Four times a day (QID) | ORAL | Status: DC | PRN

## 2021-12-25 MED ORDER — LACTATED RINGERS IV BOLUS
1000.0000 mL | Freq: Once | INTRAVENOUS | Status: AC
  Administered 2021-12-25: 1000 mL via INTRAVENOUS

## 2021-12-25 MED ORDER — POTASSIUM CHLORIDE 10 MEQ/100ML IV SOLN
10.0000 meq | Freq: Once | INTRAVENOUS | Status: AC
  Administered 2021-12-25: 10 meq via INTRAVENOUS
  Filled 2021-12-25: qty 100

## 2021-12-25 MED ORDER — BUPROPION HCL ER (XL) 150 MG PO TB24
450.0000 mg | ORAL_TABLET | Freq: Every day | ORAL | Status: DC
  Administered 2021-12-25 – 2021-12-28 (×3): 450 mg via ORAL
  Filled 2021-12-25: qty 3
  Filled 2021-12-25: qty 1
  Filled 2021-12-25: qty 3

## 2021-12-25 MED ORDER — CHLORHEXIDINE GLUCONATE CLOTH 2 % EX PADS
6.0000 | MEDICATED_PAD | Freq: Once | CUTANEOUS | Status: AC
  Administered 2021-12-26: 6 via TOPICAL

## 2021-12-25 MED ORDER — PREGABALIN 75 MG PO CAPS
450.0000 mg | ORAL_CAPSULE | Freq: Every day | ORAL | Status: DC
  Administered 2021-12-25 – 2021-12-28 (×3): 450 mg via ORAL
  Filled 2021-12-25 (×3): qty 2

## 2021-12-25 MED ORDER — PIPERACILLIN-TAZOBACTAM 3.375 G IVPB
3.3750 g | Freq: Three times a day (TID) | INTRAVENOUS | Status: AC
  Administered 2021-12-25 – 2021-12-27 (×8): 3.375 g via INTRAVENOUS
  Filled 2021-12-25 (×8): qty 50

## 2021-12-25 MED ORDER — HALOPERIDOL LACTATE 5 MG/ML IJ SOLN
2.0000 mg | Freq: Four times a day (QID) | INTRAMUSCULAR | Status: DC | PRN
  Administered 2021-12-26: 5 mg via INTRAVENOUS
  Filled 2021-12-25 (×2): qty 1

## 2021-12-25 MED ORDER — ACETAMINOPHEN 650 MG RE SUPP: 650.0000 mg | Freq: Four times a day (QID) | RECTAL | Status: DC | PRN

## 2021-12-25 MED ORDER — CHLORHEXIDINE GLUCONATE CLOTH 2 % EX PADS
6.0000 | MEDICATED_PAD | Freq: Once | CUTANEOUS | Status: AC
  Administered 2021-12-25: 6 via TOPICAL

## 2021-12-25 MED ORDER — ROSUVASTATIN CALCIUM 5 MG PO TABS
10.0000 mg | ORAL_TABLET | Freq: Every day | ORAL | Status: DC
  Administered 2021-12-25 – 2021-12-28 (×3): 10 mg via ORAL
  Filled 2021-12-25 (×3): qty 2

## 2021-12-25 MED ORDER — MAGNESIUM SULFATE 2 GM/50ML IV SOLN
2.0000 g | Freq: Once | INTRAVENOUS | Status: AC
  Administered 2021-12-25: 2 g via INTRAVENOUS
  Filled 2021-12-25: qty 50

## 2021-12-25 NOTE — ED Provider Notes (Signed)
[facility] EMERGENCY DEPARTMENT Provider Note   CSN: [id number] Arrival date & time: 12/24/21  2037     History  Chief Complaint  Patient presents with   Loss of Consciousness   Altered Mental Status    Brendan Butler is a 71 y.o. male.  72 year old male with history of cholelithiasis who presents the ER today with worsening right upper quadrant pain, nausea and weakness with decreased appetite for the last week.  Patient states he had cholelithiasis began in September as planned on get his gallbladder out but had second thoughts of canceled the surgery.  States her last week he has had on and off pain associated with lightheadedness and some sweatiness.  Nausea as well.  Thinks it might be his gallbladder presents here for further evaluation.   Loss of Consciousness Altered Mental Status      Home Medications Prior to Admission medications   Medication Sig Start Date End Date Taking? Authorizing Provider  buPROPion (WELLBUTRIN XL) 150 MG 24 hr tablet Take 450 mg by mouth daily.    [provider]  furosemide (LASIX) 20 MG tablet Take 1 tablet (20 mg total) by mouth daily. 01/02/13   [name] M, PA-C  lisinopril (ZESTRIL) 20 MG tablet Take 20 mg by mouth daily.    [provider]  nitroGLYCERIN (NITROSTAT) 0.4 MG SL tablet Place 1 tablet (0.4 mg total) under the tongue every 5 (five) minutes x 3 doses as needed for chest pain. NEED OV. 05/09/15   Runell Gess, MD  PARoxetine (PAXIL) 30 MG tablet Take 30 mg by mouth daily.    [provider]  prasugrel (EFFIENT) 10 MG TABS tablet Take 1 tablet (10 mg total) by mouth daily. 03/11/13   Runell Gess, MD  rosuvastatin (CRESTOR) 10 MG tablet Take 10 mg by mouth daily. 12/12/15   [provider]      Allergies    Levitra [vardenafil], Aspirin, Clopidogrel, and Lipitor [atorvastatin]    Review of Systems   Review of Systems  Cardiovascular:  Positive for  syncope.    Physical Exam Updated Vital Signs BP 135/62   Pulse 66   Temp 98.3 F (36.8 C)   Resp 17   SpO2 99%  Physical Exam Vitals and nursing note reviewed.  Constitutional:      Appearance: He is well-developed.  HENT:     Head: Normocephalic and atraumatic.     Mouth/Throat:     Mouth: Mucous membranes are dry.  Eyes:     Pupils: Pupils are equal, round, and reactive to light.  Cardiovascular:     Rate and Rhythm: Normal rate.  Pulmonary:     Effort: Pulmonary effort is normal. No respiratory distress.  Abdominal:     General: Abdomen is flat. There is no distension.     Tenderness: There is abdominal tenderness (Right upper quadrant without rebound or guarding).  Musculoskeletal:        General: Normal range of motion.     Cervical back: Normal range of motion.  Skin:    General: Skin is warm and dry.  Neurological:     General: No focal deficit present.     Mental Status: He is alert.     ED Results / Procedures / Treatments   Labs (all labs ordered are listed, but only abnormal results are displayed) Labs Reviewed  CBC WITH DIFFERENTIAL/PLATELET - Abnormal; Notable for the following components:      Result Value  WBC 15.0 (*)    Neutro Abs 8.8 (*)    Lymphs Abs 4.4 (*)    Monocytes Absolute 1.6 (*)    All other components within normal limits  COMPREHENSIVE METABOLIC PANEL - Abnormal; Notable for the following components:   Potassium 3.1 (*)    CO2 20 (*)    Glucose, Bld 144 (*)    Creatinine, Ser 1.81 (*)    GFR, Estimated 40 (*)    All other components within normal limits  CBG MONITORING, ED - Abnormal; Notable for the following components:   Glucose-Capillary 148 (*)    All other components within normal limits  TROPONIN I (HIGH SENSITIVITY) - Abnormal; Notable for the following components:   Troponin I (High Sensitivity) 18 (*)    All other components within normal limits  MAGNESIUM  URINALYSIS, ROUTINE W REFLEX MICROSCOPIC  TROPONIN I  (HIGH SENSITIVITY)    EKG None  Radiology US Abdomen Limited RUQ (LIVER/GB)  Result Date: 12/25/2021 CLINICAL DATA:  Abdominal pain. EXAM: ULTRASOUND ABDOMEN LIMITED RIGHT UPPER QUADRANT COMPARISON:  10/27/21 FINDINGS: Gallbladder: Gallbladder is suboptimally visualized on this exam. Gallstones are again noted, which were previously reported as measuring up to 2.2 cm. There is gallbladder wall thickening measuring up to 5.2 mm. A positive sonographic Murphy's sign was reported. Common bile duct: Diameter: 6.5 mm.  No intrahepatic bile duct dilatation. Liver: No focal lesion identified. Increase in parenchymal echogenicity. Portal vein is patent on color Doppler imaging with normal direction of blood flow towards the liver. Other: Diminished exam detail. Difficulty visualizing gallbladder on today's exam. IMPRESSION: 1. Gallbladder wall thickening and gallstones. Positive sonographic Murphy's sign reported. 2. Common bile duct measures 6.5 mm which is upper limits of normal. No intrahepatic bile duct dilatation. 3. Hepatic steatosis. Electronically Signed   By: Signa Kell M.D.   On: 12/25/2021 06:21   CT Head Wo Contrast  Result Date: 12/24/2021 CLINICAL DATA:  Mental status change, unknown cause EXAM: CT HEAD WITHOUT CONTRAST TECHNIQUE: Contiguous axial images were obtained from the base of the skull through the vertex without intravenous contrast. RADIATION DOSE REDUCTION: This exam was performed according to the departmental dose-optimization program which includes automated exposure control, adjustment of the mA and/or kV according to patient size and/or use of iterative reconstruction technique. COMPARISON:  None Available. FINDINGS: Brain: There is atrophy and chronic small vessel disease changes. Old left basal ganglia lacunar infarcts. No acute intracranial abnormality. Specifically, no hemorrhage, hydrocephalus, mass lesion, acute infarction, or significant intracranial injury. Vascular: No  hyperdense vessel or unexpected calcification. Skull: No acute calvarial abnormality. Sinuses/Orbits: No acute findings Other: None IMPRESSION: Old left basal ganglia lacunar infarcts. Atrophy, chronic microvascular disease. No acute intracranial abnormality. Electronically Signed   By: Charlett Nose M.D.   On: 12/24/2021 23:38    Procedures Procedures    Medications Ordered in ED Medications  potassium chloride SA (KLOR-CON M) CR tablet 40 mEq (has no administration in time range)  lactated ringers bolus 1,000 mL (has no administration in time range)  magnesium sulfate IVPB 2 g 50 mL (has no administration in time range)  potassium chloride 10 mEq in 100 mL IVPB (has no administration in time range)    ED Course/ Medical Decision Making/ A&P                           Medical Decision Making Amount and/or Complexity of Data Reviewed Labs: ordered. Radiology: ordered. ECG/medicine tests: ordered.  Risk Prescription drug management. Decision regarding hospitalization.   Known cholelithiasis now has gallbladder wall thickening and positive sonographic Murphy sign.  No pericholecystic fluid however concern for possible cholecystitis.  Discussed with Dr. Kieth Brightly from surgery who will evaluate for recommendations. May have had an episode of bradycardia in triage associated with the pain. K low, repleted. Mg as well. If no surgery, would suggest obs for the bradycardic event anyway.   Final Clinical Impression(s) / ED Diagnoses Final diagnoses:  Hypokalemia  Cholecystitis    Rx / DC Orders ED Discharge Orders     None         Daemyn Gariepy, Corene Cornea, MD 12/25/21 [phone]

## 2021-12-25 NOTE — Consult Note (Signed)
Cardiology Consultation   Patient ID: Brendan Butler MRN: [id number]; DOB: 08-27-1950  Admit date: 12/24/2021 Date of Consult: 12/25/2021  PCP:  Brendan Housekeeper, MD   [facility] Providers Cardiologist:  None        Patient Profile:   Brendan Butler is a 71 y.o. male with a hx of STEMI 2014-with PCI to LAD, repeat STEMI 1 week later presented with STEMI + Vfib with cath, new in stent thrombosis, HLD, HTN , lymphocytosis for 14 years   who is being seen 12/25/2021 for the evaluation of pre-op and bradycardia at the request of Dr. Demaris Butler. Brendan Butler.  History of Present Illness:   Brendan Butler with above hx first stent to LAD 2001, patent on cath 2007, and cath 2014 with stents to LAD with STEMI and then repeat STEMI and V FIB arrest in cath lab. .Cath 03/2013 with long area of stented segment commencing proximally just beyond the septal perforating artery and extending into the mid vessel. A large diagonal but vessel was jailed by this and there was 30-40% ostial narrowing. Just beyond the diagonal vessel there was focal 70% in-stent restenosis somewhat eccentric undergoing Successful Flextome Cutting Balloon atherotomy dilated to 3.62 mm with the 70% stenosis being reduced to 0%.  No follow up since 2015 except for consult in hospital 2017 for pain but never followed up.    Recent request for surgical clearance for lap chole for cholelithiasis but pt then cancelled surgery.      Pt presented to ER yesterday with abd pain, intermittent confusion for 1 week and multiple episodes of syncope. One in triage with bradycardia , dizziness and diaphoresis.   + abd pain with abd u/s  gallbladder wall thickening and positive sonographic Murphy sign.  No pericholecystic fluid however concern for possible cholecystitis.   EKG:  The EKG was personally reviewed and demonstrates:  SR  with non specific ST changes  Telemetry:  Telemetry was personally reviewed and demonstrates:  NSR   Na  140  K+ 3.1 BUN 23  Cr 1.81 LFTs WNL  Hs troponin 18; 17  WBC 15, HGB 15.1 plts 374   CT head: IMPRESSION: Old left basal ganglia lacunar infarcts.  Atrophy, chronic microvascular disease.  No acute intracranial abnormality.   BP 167/70 P 68 R 17-26 temp 98    Past Medical History:  Diagnosis Date   Acute myocardial infarction of other anterior wall, initial episode of care 12/29/12   Promus stent to LAD   Acute ST segment elevation MI (HCC) 01/05/13   secondary to thrombus in stent; Brilintta changed to Effient pt with ASA allergy   Coronary artery disease 2001   stent to LAD and patent 2007 on cath   Echocardiogram abnormal 01/06/13   EF 45-50%, grade I diastolic dysfunction   Erectile dysfunction    Hyperlipidemia LDL goal <70    Hypertension    OSA (obstructive sleep apnea)    No CPAP use overnight    Past Surgical History:  Procedure Laterality Date   BACK SURGERY  2007   Decompression lumbar laminectomy and micro discectomy, L4-5   CARDIAC CATHETERIZATION  2007   patent stent to LAD and normal Cors   CORONARY ANGIOPLASTY WITH STENT PLACEMENT  2001   to LAD   CORONARY ANGIOPLASTY WITH STENT PLACEMENT  12/29/12   STEMI with promus DES to LAD   CORONARY ANGIOPLASTY WITH STENT PLACEMENT  01/05/13   STEMI with thrombosis in stent to LAD  LEFT HEART CATH N/A 12/29/2012   Procedure: LEFT HEART CATH;  Surgeon: Brendan Booze, MD;  Location: [facility] CATH LAB;  Service: Cardiovascular;  Laterality: N/A;   LEFT HEART CATHETERIZATION WITH CORONARY ANGIOGRAM N/A 01/05/2013   Procedure: LEFT HEART CATHETERIZATION WITH CORONARY ANGIOGRAM;  Surgeon: Brendan Ohara, MD;  Location: [facility] CATH LAB;  Service: Cardiovascular;  Laterality: N/A;   LEFT HEART CATHETERIZATION WITH CORONARY ANGIOGRAM N/A 04/22/2013   Procedure: LEFT HEART CATHETERIZATION WITH CORONARY ANGIOGRAM;  Surgeon: Brendan Sine, MD;  Location: [facility] CATH LAB;  Service: Cardiovascular;  Laterality: N/A;   PARTIAL  NEPHRECTOMY Left 1975   PATIENT ONLY HAS ONE KIDNEY   PERCUTANEOUS CORONARY STENT INTERVENTION (PCI-S)  12/29/2012   Procedure: PERCUTANEOUS CORONARY STENT INTERVENTION (PCI-S);  Surgeon: Brendan Booze, MD;  Location: [facility] CATH LAB;  Service: Cardiovascular;;     Home Medications:  Prior to Admission medications   Medication Sig Start Date End Date Taking? Authorizing Provider  buPROPion (WELLBUTRIN XL) 150 MG 24 hr tablet Take 450 mg by mouth daily.   Yes [provider]  fluticasone (FLONASE) 50 MCG/ACT nasal spray Place 1 spray into both nostrils daily. 12/24/21  Yes [provider]  furosemide (LASIX) 20 MG tablet Take 1 tablet (20 mg total) by mouth daily. 01/02/13  Yes Simmons, Brittainy M, PA-C  lisinopril (ZESTRIL) 40 MG tablet Take 40 mg by mouth daily. 12/24/21  Yes [provider]  nitroGLYCERIN (NITROSTAT) 0.4 MG SL tablet Place 1 tablet (0.4 mg total) under the tongue every 5 (five) minutes x 3 doses as needed for chest pain. NEED OV. 05/09/15  Yes Brendan Harp, MD  PARoxetine (PAXIL) 30 MG tablet Take 30 mg by mouth daily.   Yes [provider]  potassium chloride (KLOR-CON Butler) 10 MEQ tablet Take 10 mEq by mouth daily. 12/07/21  Yes [provider]  prasugrel (EFFIENT) 10 MG TABS tablet Take 1 tablet (10 mg total) by mouth daily. 03/11/13  Yes Brendan Harp, MD  pregabalin (LYRICA) 150 MG capsule Take 450 mg by mouth daily. 11/20/21  Yes [provider]  rosuvastatin (CRESTOR) 10 MG tablet Take 10 mg by mouth daily. 12/12/15  Yes [provider]    Inpatient Medications: Scheduled Meds:  Continuous Infusions:  PRN Meds: haloperidol lactate  Allergies:    Allergies  Allergen Reactions   Levitra [Vardenafil] Other (See Comments)    Blurred vision   Aspirin Hives   Clopidogrel Other (See Comments)    Not effective anti platelet med for patient-switched to effient   Lipitor [Atorvastatin] Other (See  Comments)    Muscle cramps; patient states he does not take medication at home    Social History:   Social History   Socioeconomic History   Marital status: Divorced    Spouse name: Not on file   Number of children: Not on file   Years of education: Not on file   Highest education level: Not on file  Occupational History   Not on file  Tobacco Use   Smoking status: Former    Packs/day: 2.00    Years: 25.00    Total pack years: 50.00    Types: Cigarettes    Quit date: 2001    Years since quitting: 22.8   Smokeless tobacco: Current    Types: Chew  Vaping Use   Vaping Use: Never used  Substance and Sexual Activity   Alcohol use: Not Currently    Comment: 2001 stopped drinking  Drug use: No   Sexual activity: Not on file  Other Topics Concern   Not on file  Social History Narrative   Not on file   Social Determinants of Health   Financial Resource Strain: Not on file  Food Insecurity: Not on file  Transportation Needs: Not on file  Physical Activity: Not on file  Stress: Not on file  Social Connections: Not on file  Intimate Partner Violence: Not on file    Family History:    Family History  Problem Relation Age of Onset   Alzheimer's disease Mother    CAD Father      ROS:  Please see the history of present illness.  General:no colds or fevers, no weight changes Skin:no rashes or ulcers HEENT:no blurred vision, no congestion CV:see HPI PUL:see HPI GI:no diarrhea constipation or melena, no indigestion GU:no hematuria, no dysuria MS:no joint pain, no claudication Neuro:no syncope, no lightheadedness Endo:no diabetes, no thyroid disease  All other ROS reviewed and negative.     Physical Exam/Data:   Vitals:   12/25/21 0645 12/25/21 0657 12/25/21 0700 12/25/21 0945  BP: (!) 144/56  (!) 147/69 (!) 167/70  Pulse: 72  72 68  Resp: 14  19 17   Temp:  98.1 F (36.7 C)    TempSrc:  Oral    SpO2: 100%  100% 100%   No intake or output data in the 24  hours ending 12/25/21 1005    01/16/2021    2:35 PM 06/21/2019    4:08 AM 10/04/2017    9:35 AM  Last 3 Weights  Weight (lbs) 190 lb 209 lb 233 lb 8 oz  Weight (kg) 86.183 kg 94.802 kg 105.915 kg     There is no height or weight on file to calculate BMI.   Affect appropriate Healthy:  appears stated age 36: normal Neck supple with no adenopathy JVP normal no bruits no thyromegaly Lungs mild inspirator rhonchi/wheezing  Heart:  S1/S2 no murmur, no rub, gallop or click PMI normal Abdomen: no distension RUQ pain no rebound  Distal pulses intact with no bruits No edema Neuro non-focal Skin warm and dry No muscular weakness     Relevant CV Studies: Cardiac cath 2015 ANGIOGRAPHY:   1. Left main: Angiographically normal vessel trifurcating into the LAD, a ramus intermediate vessel, and a left circumflex coronary artery. 2. LAD: Long area of stented segment commencing proximally just beyond the septal perforating artery and extending into the mid vessel. A large diagonal but vessel was jailed by this and there was 30-40% ostial narrowing. Just beyond the diagonal vessel there was focal 70% in-stent restenosis somewhat eccentric 3. Ramus Intermediate: Angiographically normal 4. Left circumflex: Angiographically normal.  5. Right coronary artery: Large dominant angiographically normal vessel     Following successful percutaneous coronary intervention to the focal in-stent restenosis in the mid LAD stent treated with  A 3.5 x 10 mm Flextome Cutting Balloon atherotomy catheter with 5 dilatations up to 3.62 mm, the 70% stenosis was reduced to 0%. There was brisk TIMI-3 flow. The patient experiences identical chest pain with each balloon inflation.   Left ventriculography revealed normal LV function without focal segmental wall motion abnormalities.   IMPRESSION:   Normal LV function   Focal 70% in-stent restenosis in the mid LAD stent immediately beyond the diagonal takeoff with a  30% narrowing at the ostium of this first diagonal vessel.   Successful Flextome Cutting Balloon atherotomy dilated to 3.62 mm with the 70% stenosis  being reduced to 0%   Angiomax/Effient/IC nitroglycerin    Laboratory Data:  High Sensitivity Troponin:   Recent Labs  Lab 12/24/21 2133 12/25/21 0421  TROPONINIHS 18* 17     Chemistry Recent Labs  Lab 12/24/21 2133 12/25/21 0421  NA 140  --   K 3.1*  --   CL 106  --   CO2 20*  --   GLUCOSE 144*  --   BUN 23  --   CREATININE 1.81*  --   CALCIUM 9.5  --   MG  --  2.2  GFRNONAA 40*  --   ANIONGAP 14  --     Recent Labs  Lab 12/24/21 2133  PROT 7.0  ALBUMIN 3.7  AST 24  ALT 19  ALKPHOS 98  BILITOT 1.1   Lipids No results for input(s): "CHOL", "TRIG", "HDL", "LABVLDL", "LDLCALC", "CHOLHDL" in the last 168 hours.  Hematology Recent Labs  Lab 12/24/21 2133  WBC 15.0*  RBC 4.75  HGB 15.1  HCT 43.0  MCV 90.5  MCH 31.8  MCHC 35.1  RDW 12.5  PLT 374   Thyroid No results for input(s): "TSH", "FREET4" in the last 168 hours.  BNPNo results for input(s): "BNP", "PROBNP" in the last 168 hours.  DDimer No results for input(s): "DDIMER" in the last 168 hours.   Radiology/Studies:  US Abdomen Limited RUQ (LIVER/GB)  Result Date: 12/25/2021 CLINICAL DATA:  Abdominal pain. EXAM: ULTRASOUND ABDOMEN LIMITED RIGHT UPPER QUADRANT COMPARISON:  10/27/21 FINDINGS: Gallbladder: Gallbladder is suboptimally visualized on this exam. Gallstones are again noted, which were previously reported as measuring up to 2.2 cm. There is gallbladder wall thickening measuring up to 5.2 mm. A positive sonographic Murphy's sign was reported. Common bile duct: Diameter: 6.5 mm.  No intrahepatic bile duct dilatation. Liver: No focal lesion identified. Increase in parenchymal echogenicity. Portal vein is patent on color Doppler imaging with normal direction of blood flow towards the liver. Other: Diminished exam detail. Difficulty visualizing gallbladder  on today's exam. IMPRESSION: 1. Gallbladder wall thickening and gallstones. Positive sonographic Murphy's sign reported. 2. Common bile duct measures 6.5 mm which is upper limits of normal. No intrahepatic bile duct dilatation. 3. Hepatic steatosis. Electronically Signed   By: Kerby Moors Butler.D.   On: 12/25/2021 06:21   CT Head Wo Contrast  Result Date: 12/24/2021 CLINICAL DATA:  Mental status change, unknown cause EXAM: CT HEAD WITHOUT CONTRAST TECHNIQUE: Contiguous axial images were obtained from the base of the skull through the vertex without intravenous contrast. RADIATION DOSE REDUCTION: This exam was performed according to the departmental dose-optimization program which includes automated exposure control, adjustment of the mA and/or kV according to patient size and/or use of iterative reconstruction technique. COMPARISON:  None Available. FINDINGS: Brain: There is atrophy and chronic small vessel disease changes. Old left basal ganglia lacunar infarcts. No acute intracranial abnormality. Specifically, no hemorrhage, hydrocephalus, mass lesion, acute infarction, or significant intracranial injury. Vascular: No hyperdense vessel or unexpected calcification. Skull: No acute calvarial abnormality. Sinuses/Orbits: No acute findings Other: None IMPRESSION: Old left basal ganglia lacunar infarcts. Atrophy, chronic microvascular disease. No acute intracranial abnormality. Electronically Signed   By: Rolm Baptise Butler.D.   On: 12/24/2021 23:38     Assessment and Plan:   Pre-op eval for lap chole with hx of CAD and stent to LAD 2007, STEMI 2014 with stents to LAD and repeat STEMI 2014 in 1 week with v fib arrest in cath lab and Acute anterior wall myocardial infarction  secondary to LAD stent thrombosis, treated successfully with IVUS-guided PCI (pt with allergy to ASA and discharged on Brilinta on second STEMI he was placed on Effient)  he has been on Effient since.  No real angina Will get TTE to assess EF  today Ok to proceed with low risk lap choly in am  Bradycardia this admit and near syncope to syncope can have outpatient 30 day monitor unless telemetry in  Hospital shows AV block  CAD with hx of LAD stents 2 STEMI and last cath 2015 with PCI mLAD. Hold effient for surgery  HTN  resume ACE post op  HLD  continue crestor  Acute chole on Zosyn proceed to lap choly in am  Pulmonary:   abnormal exam will check CXR prior CT ? 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 Doubt from cardiac issues CT head negative for mass lesion old lacunar infarct need neurology f/u and EEG     [name] MD [facility]

## 2021-12-25 NOTE — ED Notes (Signed)
Breakfast tray ordered. Tolerating PO at this time. Denies nausea.

## 2021-12-25 NOTE — Progress Notes (Signed)
Pharmacy Antibiotic Note  WELTON BORD is a 71 y.o. male admitted on 12/24/2021 with abdominal pain and suspected cholecystitis. Pharmacy has been consulted for Zosyn dosing.  Plan: Zosyn 3.375g IV q8h (4 hour infusion). Monitor renal function and resolution of s/s of infection.  Height: 5\' 10"  (177.8 cm) Weight: 101.6 kg (223 lb 15.8 oz) IBW/kg (Calculated) : 73  Temp (24hrs), Avg:98 F (36.7 C), Min:97.3 F (36.3 C), Max:98.3 F (36.8 C)  Recent Labs  Lab 12/24/21 2133  WBC 15.0*  CREATININE 1.81*    Estimated Creatinine Clearance: 46.6 mL/min (A) (by C-G formula based on SCr of 1.81 mg/dL (H)).    Allergies  Allergen Reactions   Levitra [Vardenafil] Other (See Comments)    Blurred vision   Aspirin Hives   Clopidogrel Other (See Comments)    Not effective anti platelet med for patient-switched to effient   Lipitor [Atorvastatin] Other (See Comments)    Muscle cramps; patient states he does not take medication at home    Antimicrobials this admission: Zosyn 10/30 >>    Dose adjustments this admission: N/a  Microbiology results: N/a  Thank you for allowing pharmacy to be a part of this patient's care.  Titus Dubin, PharmD PGY1 Pharmacy Resident 12/25/2021 10:29 AM

## 2021-12-25 NOTE — ED Provider Notes (Signed)
  Provider Note MRN:  [id number]  Arrival date & time: 12/25/21    ED Course and Medical Decision Making  Assumed care from [place] at shift change.  See note from prior team for complete details, in brief:  72 yo male Loc/brady/abd pain Lytes replaced cholecystitis, seen by surg, plan tomorrow after cards eval [facility] gen surg, request cardiac clearance prior to surgery  Plan per prior physician admit to medicine  Spoke w/ Dr Lorin Mercy who accepts pt for admission   Procedures  Final Clinical Impressions(s) / ED Diagnoses     ICD-10-CM   1. Hypokalemia  E87.6     2. Cholecystitis  K81.9       ED Discharge Orders     None       Discharge Instructions   None        Jeanell Sparrow, DO 12/25/21 7121

## 2021-12-25 NOTE — Consult Note (Signed)
**Note Brendan-Identified via Obfuscation** Reason for Consult:abdominal pain Referring Provider: Marily Memos  Brendan Butler is an 71 y.o. male.  HPI: 71 yo male with 3 month history of intermittent abdominal pain. He presented last night with multiple hours of severe pain. He was having lightheadedness as well. His pain is improved after pain medication.  Past Medical History:  Diagnosis Date   Acute myocardial infarction of other anterior wall, initial episode of care 12/29/12   Promus stent to LAD   Acute ST segment elevation MI (HCC) 01/05/13   secondary to thrombus in stent; Brilintta changed to Effient pt with ASA allergy   Coronary artery disease 2001   stent to LAD and patent 2007 on cath   Echocardiogram abnormal 01/06/13   EF 45-50%, grade I diastolic dysfunction   Erectile dysfunction    Hyperlipidemia LDL goal <70    Hypertension    OSA (obstructive sleep apnea)    No CPAP use overnight    Past Surgical History:  Procedure Laterality Date   BACK SURGERY  2007   Decompression lumbar laminectomy and micro discectomy, L4-5   CARDIAC CATHETERIZATION  2007   patent stent to LAD and normal Cors   CORONARY ANGIOPLASTY WITH STENT PLACEMENT  2001   to LAD   CORONARY ANGIOPLASTY WITH STENT PLACEMENT  12/29/12   STEMI with promus DES to LAD   CORONARY ANGIOPLASTY WITH STENT PLACEMENT  01/05/13   STEMI with thrombosis in stent to LAD   LEFT HEART CATH N/A 12/29/2012   Procedure: LEFT HEART CATH;  Surgeon: Corky Crafts, MD;  Location: [facility] CATH LAB;  Service: Cardiovascular;  Laterality: N/A;   LEFT HEART CATHETERIZATION WITH CORONARY ANGIOGRAM N/A 01/05/2013   Procedure: LEFT HEART CATHETERIZATION WITH CORONARY ANGIOGRAM;  Surgeon: Micheline Chapman, MD;  Location: [facility] CATH LAB;  Service: Cardiovascular;  Laterality: N/A;   LEFT HEART CATHETERIZATION WITH CORONARY ANGIOGRAM N/A 04/22/2013   Procedure: LEFT HEART CATHETERIZATION WITH CORONARY ANGIOGRAM;  Surgeon: Lennette Bihari, MD;  Location: [facility] CATH LAB;   Service: Cardiovascular;  Laterality: N/A;   PARTIAL NEPHRECTOMY Left 1975   PATIENT ONLY HAS ONE KIDNEY   PERCUTANEOUS CORONARY STENT INTERVENTION (PCI-S)  12/29/2012   Procedure: PERCUTANEOUS CORONARY STENT INTERVENTION (PCI-S);  Surgeon: Corky Crafts, MD;  Location: [facility] CATH LAB;  Service: Cardiovascular;;    Family History  Problem Relation Age of Onset   Alzheimer's disease Mother     Social History:  reports that he quit smoking about 22 years ago. His smoking use included cigarettes. He has a 50.00 pack-year smoking history. His smokeless tobacco use includes chew. He reports that he does not drink alcohol and does not use drugs.  Allergies:  Allergies  Allergen Reactions   Levitra [Vardenafil] Other (See Comments)    Blurred vision   Aspirin Hives   Clopidogrel Other (See Comments)    Not effective anti platelet med for patient-switched to effient   Lipitor [Atorvastatin] Other (See Comments)    Muscle cramps; patient states he does not take medication at home    Medications: I have reviewed the patient's current medications.  Results for orders placed or performed during the hospital encounter of 12/24/21 (from the past 48 hour(s))  CBC with Differential     Status: Abnormal   Collection Time: 12/24/21  9:33 PM  Result Value Ref Range   WBC 15.0 (H) 4.0 - 10.5 K/uL   RBC 4.75 4.22 - 5.81 MIL/uL   Hemoglobin 15.1 13.0 - 17.0 g/dL  HCT 43.0 39.0 - 52.0 %   MCV 90.5 80.0 - 100.0 fL   MCH 31.8 26.0 - 34.0 pg   MCHC 35.1 30.0 - 36.0 g/dL   RDW 51.8 84.1 - 66.0 %   Platelets 374 150 - 400 K/uL   nRBC 0.0 0.0 - 0.2 %   Neutrophils Relative % 57 %   Neutro Abs 8.8 (H) 1.7 - 7.7 K/uL   Lymphocytes Relative 29 %   Lymphs Abs 4.4 (H) 0.7 - 4.0 K/uL   Monocytes Relative 11 %   Monocytes Absolute 1.6 (H) 0.1 - 1.0 K/uL   Eosinophils Relative 1 %   Eosinophils Absolute 0.1 0.0 - 0.5 K/uL   Basophils Relative 1 %   Basophils Absolute 0.1 0.0 - 0.1 K/uL   Immature  Granulocytes 1 %   Abs Immature Granulocytes 0.07 0.00 - 0.07 K/uL    Comment: Performed at [facility], [address], [place], [place] [address]  Comprehensive metabolic panel     Status: Abnormal   Collection Time: 12/24/21  9:33 PM  Result Value Ref Range   Sodium 140 135 - 145 mmol/L   Potassium 3.1 (L) 3.5 - 5.1 mmol/L   Chloride 106 98 - 111 mmol/L   CO2 20 (L) 22 - 32 mmol/L   Glucose, Bld 144 (H) 70 - 99 mg/dL    Comment: Glucose reference range applies only to samples taken after fasting for at least 8 hours.   BUN 23 8 - 23 mg/dL   Creatinine, Ser 0.10 (H) 0.61 - 1.24 mg/dL   Calcium 9.5 8.9 - 93.2 mg/dL   Total Protein 7.0 6.5 - 8.1 g/dL   Albumin 3.7 3.5 - 5.0 g/dL   AST 24 15 - 41 U/L   ALT 19 0 - 44 U/L   Alkaline Phosphatase 98 38 - 126 U/L   Total Bilirubin 1.1 0.3 - 1.2 mg/dL   GFR, Estimated 40 (L) >60 mL/min    Comment: (NOTE) Calculated using the CKD-EPI Creatinine Equation (2021)    Anion gap 14 5 - 15    Comment: Performed at [facility], [address], [place], [place] [address]  Troponin I (High Sensitivity)     Status: Abnormal   Collection Time: 12/24/21  9:33 PM  Result Value Ref Range   Troponin I (High Sensitivity) 18 (H) <18 ng/L    Comment: (NOTE) Elevated high sensitivity troponin I (hsTnI) values and significant  changes across serial measurements may suggest ACS but many other  chronic and acute conditions are known to elevate hsTnI results.  Refer to the "Links" section for chest pain algorithms and additional  guidance. Performed at [facility], [address], [place], [place] [address]   CBG monitoring, ED     Status: Abnormal   Collection Time: 12/24/21  9:43 PM  Result Value Ref Range   Glucose-Capillary 148 (H) 70 - 99 mg/dL    Comment: Glucose reference range applies only to samples taken after fasting for at least 8 hours.  Troponin I (High Sensitivity)     Status: None   Collection Time: 12/25/21  4:21  AM  Result Value Ref Range   Troponin I (High Sensitivity) 17 <18 ng/L    Comment: (NOTE) Elevated high sensitivity troponin I (hsTnI) values and significant  changes across serial measurements may suggest ACS but many other  chronic and acute conditions are known to elevate hsTnI results.  Refer to the "Links"  section for chest pain algorithms and additional  guidance. Performed at [facility], [place] [address], [place], [place] [address]   Magnesium     Status: None   Collection Time: 12/25/21  4:21 AM  Result Value Ref Range   Magnesium 2.2 1.7 - 2.4 mg/dL    Comment: HEMOLYSIS AT THIS LEVEL MAY AFFECT RESULT Performed at [place] [address], [place], [place] [address]     US Abdomen Limited RUQ (LIVER/GB)  Result Date: 12/25/2021 CLINICAL DATA:  Abdominal pain. EXAM: ULTRASOUND ABDOMEN LIMITED RIGHT UPPER QUADRANT COMPARISON:  10/27/21 FINDINGS: Gallbladder: Gallbladder is suboptimally visualized on this exam. Gallstones are again noted, which were previously reported as measuring up to 2.2 cm. There is gallbladder wall thickening measuring up to 5.2 mm. A positive sonographic Murphy's sign was reported. Common bile duct: Diameter: 6.5 mm.  No intrahepatic bile duct dilatation. Liver: No focal lesion identified. Increase in parenchymal echogenicity. Portal vein is patent on color Doppler imaging with normal direction of blood flow towards the liver. Other: Diminished exam detail. Difficulty visualizing gallbladder on today's exam. IMPRESSION: 1. Gallbladder wall thickening and gallstones. Positive sonographic Murphy's sign reported. 2. Common bile duct measures 6.5 mm which is upper limits of normal. No intrahepatic bile duct dilatation. 3. Hepatic steatosis. Electronically Signed   By: Kerby Moors M.D.   On: 12/25/2021 06:21   CT Head Wo Contrast  Result Date: 12/24/2021 CLINICAL DATA:  Mental status change, unknown cause EXAM: CT HEAD WITHOUT CONTRAST  TECHNIQUE: Contiguous axial images were obtained from the base of the skull through the vertex without intravenous contrast. RADIATION DOSE REDUCTION: This exam was performed according to the departmental dose-optimization program which includes automated exposure control, adjustment of the mA and/or kV according to patient size and/or use of iterative reconstruction technique. COMPARISON:  None Available. FINDINGS: Brain: There is atrophy and chronic small vessel disease changes. Old left basal ganglia lacunar infarcts. No acute intracranial abnormality. Specifically, no hemorrhage, hydrocephalus, mass lesion, acute infarction, or significant intracranial injury. Vascular: No hyperdense vessel or unexpected calcification. Skull: No acute calvarial abnormality. Sinuses/Orbits: No acute findings Other: None IMPRESSION: Old left basal ganglia lacunar infarcts. Atrophy, chronic microvascular disease. No acute intracranial abnormality. Electronically Signed   By: Rolm Baptise M.D.   On: 12/24/2021 23:38    Review of Systems  Constitutional: Negative.   HENT: Negative.    Eyes: Negative.   Respiratory: Negative.    Cardiovascular: Negative.   Gastrointestinal:  Positive for abdominal pain.  Genitourinary: Negative.   Musculoskeletal: Negative.   Skin: Negative.   Neurological: Negative.   Endo/Heme/Allergies: Negative.   Psychiatric/Behavioral: Negative.      PE Blood pressure (!) 147/69, pulse 72, temperature 98.1 F (36.7 C), temperature source Oral, resp. rate 19, SpO2 100 %. Constitutional: NAD; conversant; no deformities Eyes: Moist conjunctiva; no lid lag; anicteric; PERRL Neck: Trachea midline; no thyromegaly Lungs: Normal respiratory effort; no tactile fremitus CV: RRR; no palpable thrills; no pitting edema GI: Abd minimal tenderness; no palpable hepatosplenomegaly MSK: Normal gait; no clubbing/cyanosis Psychiatric: Appropriate affect; alert and oriented x3 Lymphatic: No palpable  cervical or axillary lymphadenopathy Skin: No major subcutaneous nodules. Warm and dry   Assessment/Plan: 71 yo male with acute calculous cholecystitis -IV abx -pain control -admit to hospitalist -consult cardiology -We discussed the etiology of her pain, we discussed treatment options and recommended surgery. We discussed details of surgery including general anesthesia, laparoscopic approach, identification of cystic duct and common  bile duct. Ligation of cystic duct and cystic artery. Possible need for intraoperative cholangiogram or open procedure. Possible risks of common bile duct injury, liver injury, cystic duct leak, bleeding, infection, post-cholecystectomy syndrome. The patient showed good understanding and all questions were answered   I reviewed last 24 h vitals and pain scores, last 48 h intake and output, last 24 h labs and trends, and last 24 h imaging results.  This care required high  level of medical decision making.   Brendan Butler 12/25/2021, 7:10 AM
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
## 2021-12-26 ENCOUNTER — Inpatient Hospital Stay (HOSPITAL_COMMUNITY): Payer: Medicare HMO | Admitting: Anesthesiology

## 2021-12-26 ENCOUNTER — Inpatient Hospital Stay (HOSPITAL_BASED_OUTPATIENT_CLINIC_OR_DEPARTMENT_OTHER): Payer: Medicare HMO

## 2021-12-26 ENCOUNTER — Encounter (HOSPITAL_COMMUNITY): Payer: Self-pay | Admitting: Internal Medicine

## 2021-12-26 ENCOUNTER — Encounter (HOSPITAL_COMMUNITY)
Admission: EM | Disposition: A | Discharge status: Home / Self Care | Payer: Self-pay | Source: Home / Self Care | Attending: Internal Medicine

## 2021-12-26 ENCOUNTER — Other Ambulatory Visit: Payer: Self-pay

## 2021-12-26 DIAGNOSIS — I25119 Atherosclerotic heart disease of native coronary artery with unspecified angina pectoris: Secondary | ICD-10-CM

## 2021-12-26 DIAGNOSIS — K8066 Calculus of gallbladder and bile duct with acute and chronic cholecystitis without obstruction: Secondary | ICD-10-CM | POA: Diagnosis not present

## 2021-12-26 DIAGNOSIS — K660 Peritoneal adhesions (postprocedural) (postinfection): Secondary | ICD-10-CM | POA: Diagnosis not present

## 2021-12-26 DIAGNOSIS — Z01810 Encounter for preprocedural cardiovascular examination: Secondary | ICD-10-CM

## 2021-12-26 DIAGNOSIS — I252 Old myocardial infarction: Secondary | ICD-10-CM

## 2021-12-26 DIAGNOSIS — K810 Acute cholecystitis: Secondary | ICD-10-CM

## 2021-12-26 DIAGNOSIS — I10 Essential (primary) hypertension: Secondary | ICD-10-CM

## 2021-12-26 DIAGNOSIS — R918 Other nonspecific abnormal finding of lung field: Secondary | ICD-10-CM

## 2021-12-26 DIAGNOSIS — I5021 Acute systolic (congestive) heart failure: Secondary | ICD-10-CM | POA: Diagnosis not present

## 2021-12-26 DIAGNOSIS — Z87891 Personal history of nicotine dependence: Secondary | ICD-10-CM | POA: Diagnosis not present

## 2021-12-26 HISTORY — PX: CHOLECYSTECTOMY: SHX55

## 2021-12-26 LAB — COMPREHENSIVE METABOLIC PANEL
ALT: 17 U/L (ref 0–44)
AST: 18 U/L (ref 15–41)
Albumin: 2.9 g/dL — ABNORMAL LOW (ref 3.5–5.0)
Alkaline Phosphatase: 82 U/L (ref 38–126)
Anion gap: 14 (ref 5–15)
BUN: 26 mg/dL — ABNORMAL HIGH (ref 8–23)
CO2: 22 mmol/L (ref 22–32)
Calcium: 8.7 mg/dL — ABNORMAL LOW (ref 8.9–10.3)
Chloride: 105 mmol/L (ref 98–111)
Creatinine, Ser: 1.84 mg/dL — ABNORMAL HIGH (ref 0.61–1.24)
GFR, Estimated: 39 mL/min — ABNORMAL LOW (ref >60)
Glucose, Bld: 87 mg/dL (ref 70–99)
Potassium: 3.4 mmol/L — ABNORMAL LOW (ref 3.5–5.1)
Sodium: 141 mmol/L (ref 135–145)
Total Bilirubin: 1.1 mg/dL (ref 0.3–1.2)
Total Protein: 5.7 g/dL — ABNORMAL LOW (ref 6.5–8.1)

## 2021-12-26 LAB — CBC
HCT: 35.2 % — ABNORMAL LOW (ref 39.0–52.0)
Hemoglobin: 12.1 g/dL — ABNORMAL LOW (ref 13.0–17.0)
MCH: 31.5 pg (ref 26.0–34.0)
MCHC: 34.4 g/dL (ref 30.0–36.0)
MCV: 91.7 fL (ref 80.0–100.0)
Platelets: 291 10*3/uL (ref 150–400)
RBC: 3.84 MIL/uL — ABNORMAL LOW (ref 4.22–5.81)
RDW: 13 % (ref 11.5–15.5)
WBC: 21.7 10*3/uL — ABNORMAL HIGH (ref 4.0–10.5)
nRBC: 0 % (ref 0.0–0.2)

## 2021-12-26 LAB — ECHOCARDIOGRAM COMPLETE
Area-P 1/2: 2.26 cm2
Calc EF: 56.2 %
Height: 70 in
S' Lateral: 4 cm
Single Plane A2C EF: 54 %
Single Plane A4C EF: 58.4 %
Weight: 3562.63 oz

## 2021-12-26 LAB — TYPE AND SCREEN
ABO/RH(D): O POS
Antibody Screen: NEGATIVE

## 2021-12-26 LAB — ABO/RH: ABO/RH(D): O POS

## 2021-12-26 SURGERY — LAPAROSCOPIC CHOLECYSTECTOMY
Anesthesia: General

## 2021-12-26 MED ORDER — ROCURONIUM BROMIDE 10 MG/ML (PF) SYRINGE
PREFILLED_SYRINGE | INTRAVENOUS | Status: DC | PRN
  Administered 2021-12-26: 20 mg via INTRAVENOUS
  Administered 2021-12-26: 60 mg via INTRAVENOUS

## 2021-12-26 MED ORDER — FENTANYL CITRATE (PF) 250 MCG/5ML IJ SOLN
INTRAMUSCULAR | Status: AC
  Filled 2021-12-26: qty 5

## 2021-12-26 MED ORDER — PHENYLEPHRINE 80 MCG/ML (10ML) SYRINGE FOR IV PUSH (FOR BLOOD PRESSURE SUPPORT)
PREFILLED_SYRINGE | INTRAVENOUS | Status: DC | PRN
  Administered 2021-12-26 (×2): 160 ug via INTRAVENOUS

## 2021-12-26 MED ORDER — FENTANYL CITRATE (PF) 100 MCG/2ML IJ SOLN
INTRAMUSCULAR | Status: AC
  Filled 2021-12-26: qty 2

## 2021-12-26 MED ORDER — PROPOFOL 10 MG/ML IV BOLUS
INTRAVENOUS | Status: DC | PRN
  Administered 2021-12-26: 140 mg via INTRAVENOUS

## 2021-12-26 MED ORDER — FENTANYL CITRATE (PF) 100 MCG/2ML IJ SOLN
25.0000 ug | INTRAMUSCULAR | Status: DC | PRN
  Administered 2021-12-26: 50 ug via INTRAVENOUS

## 2021-12-26 MED ORDER — PHENYLEPHRINE HCL-NACL 20-0.9 MG/250ML-% IV SOLN
INTRAVENOUS | Status: DC | PRN
  Administered 2021-12-26: 50 ug/min via INTRAVENOUS

## 2021-12-26 MED ORDER — LACTATED RINGERS IV SOLN: INTRAVENOUS | Status: DC

## 2021-12-26 MED ORDER — BUPIVACAINE-EPINEPHRINE 0.25% -1:200000 IJ SOLN
INTRAMUSCULAR | Status: DC | PRN
  Administered 2021-12-26: 22 mL

## 2021-12-26 MED ORDER — ONDANSETRON HCL 4 MG/2ML IJ SOLN: 4.0000 mg | Freq: Four times a day (QID) | INTRAMUSCULAR | Status: DC | PRN

## 2021-12-26 MED ORDER — SODIUM CHLORIDE 0.9 % IR SOLN
Status: DC | PRN
  Administered 2021-12-26: 1000 mL

## 2021-12-26 MED ORDER — 0.9 % SODIUM CHLORIDE (POUR BTL) OPTIME
TOPICAL | Status: DC | PRN
  Administered 2021-12-26: 1000 mL

## 2021-12-26 MED ORDER — OXYCODONE HCL 5 MG/5ML PO SOLN: 5.0000 mg | Freq: Once | ORAL | Status: DC | PRN

## 2021-12-26 MED ORDER — ALBUMIN HUMAN 5 % IV SOLN: INTRAVENOUS | Status: DC | PRN

## 2021-12-26 MED ORDER — BUPIVACAINE-EPINEPHRINE (PF) 0.25% -1:200000 IJ SOLN
INTRAMUSCULAR | Status: AC
  Filled 2021-12-26: qty 30

## 2021-12-26 MED ORDER — MIDAZOLAM HCL 2 MG/2ML IJ SOLN
INTRAMUSCULAR | Status: DC | PRN
  Administered 2021-12-26: 2 mg via INTRAVENOUS

## 2021-12-26 MED ORDER — PROPOFOL 10 MG/ML IV BOLUS
INTRAVENOUS | Status: AC
  Filled 2021-12-26: qty 20

## 2021-12-26 MED ORDER — ORAL CARE MOUTH RINSE: 15.0000 mL | Freq: Once | OROMUCOSAL | Status: AC

## 2021-12-26 MED ORDER — OXYCODONE HCL 5 MG PO TABS: 5.0000 mg | ORAL_TABLET | Freq: Once | ORAL | Status: DC | PRN

## 2021-12-26 MED ORDER — FENTANYL CITRATE (PF) 250 MCG/5ML IJ SOLN
INTRAMUSCULAR | Status: DC | PRN
  Administered 2021-12-26: 50 ug via INTRAVENOUS
  Administered 2021-12-26: 100 ug via INTRAVENOUS
  Administered 2021-12-26: 50 ug via INTRAVENOUS

## 2021-12-26 MED ORDER — SUGAMMADEX SODIUM 200 MG/2ML IV SOLN
INTRAVENOUS | Status: DC | PRN
  Administered 2021-12-26: 404 mg via INTRAVENOUS

## 2021-12-26 MED ORDER — DEXAMETHASONE SODIUM PHOSPHATE 10 MG/ML IJ SOLN
INTRAMUSCULAR | Status: DC | PRN
  Administered 2021-12-26: 10 mg via INTRAVENOUS

## 2021-12-26 MED ORDER — CHLORHEXIDINE GLUCONATE 0.12 % MT SOLN
15.0000 mL | Freq: Once | OROMUCOSAL | Status: AC
  Administered 2021-12-26: 15 mL via OROMUCOSAL

## 2021-12-26 MED ORDER — ONDANSETRON HCL 4 MG/2ML IJ SOLN
INTRAMUSCULAR | Status: DC | PRN
  Administered 2021-12-26: 4 mg via INTRAVENOUS

## 2021-12-26 MED ORDER — LIDOCAINE 2% (20 MG/ML) 5 ML SYRINGE
INTRAMUSCULAR | Status: DC | PRN
  Administered 2021-12-26: 60 mg via INTRAVENOUS

## 2021-12-26 MED ORDER — MIDAZOLAM HCL 2 MG/2ML IJ SOLN
INTRAMUSCULAR | Status: AC
  Filled 2021-12-26: qty 2

## 2021-12-26 SURGICAL SUPPLY — 51 items
ADH SKN CLS APL DERMABOND .7 (GAUZE/BANDAGES/DRESSINGS) ×1
APL PRP STRL LF DISP 70% ISPRP (MISCELLANEOUS) ×1
APPLIER CLIP 5 13 M/L LIGAMAX5 (MISCELLANEOUS) ×1
APPLIER CLIP ROT 10 11.4 M/L (STAPLE) ×1
APR CLP MED LRG 11.4X10 (STAPLE) ×1
APR CLP MED LRG 5 ANG JAW (MISCELLANEOUS) ×1
BAG COUNTER SPONGE SURGICOUNT (BAG) ×1 IMPLANT
BAG SPEC RTRVL 10 TROC 200 (ENDOMECHANICALS) ×1
BAG SPNG CNTER NS LX DISP (BAG) ×1
BLADE CLIPPER SURG (BLADE) IMPLANT
CANISTER SUCT 3000ML PPV (MISCELLANEOUS) ×1 IMPLANT
CHLORAPREP W/TINT 26 (MISCELLANEOUS) ×1 IMPLANT
CLIP APPLIE 5 13 M/L LIGAMAX5 (MISCELLANEOUS) ×1 IMPLANT
CLIP APPLIE ROT 10 11.4 M/L (STAPLE) IMPLANT
COVER SURGICAL LIGHT HANDLE (MISCELLANEOUS) ×1 IMPLANT
DERMABOND ADVANCED .7 DNX12 (GAUZE/BANDAGES/DRESSINGS) ×1 IMPLANT
ELECT REM PT RETURN 9FT ADLT (ELECTROSURGICAL) ×1
ELECTRODE REM PT RTRN 9FT ADLT (ELECTROSURGICAL) ×1 IMPLANT
GLOVE BIO SURGEON STRL SZ8 (GLOVE) ×1 IMPLANT
GLOVE BIOGEL PI IND STRL 8 (GLOVE) ×1 IMPLANT
GOWN STRL REUS W/ TWL LRG LVL3 (GOWN DISPOSABLE) ×2 IMPLANT
GOWN STRL REUS W/ TWL XL LVL3 (GOWN DISPOSABLE) ×1 IMPLANT
GOWN STRL REUS W/TWL LRG LVL3 (GOWN DISPOSABLE) ×2
GOWN STRL REUS W/TWL XL LVL3 (GOWN DISPOSABLE) ×1
GRASPER SUT TROCAR 14GX15 (MISCELLANEOUS) IMPLANT
HEMOSTAT SNOW SURGICEL 2X4 (HEMOSTASIS) IMPLANT
KIT BASIN OR (CUSTOM PROCEDURE TRAY) ×1 IMPLANT
KIT TURNOVER KIT B (KITS) ×1 IMPLANT
L-HOOK LAP DISP 36CM (ELECTROSURGICAL) ×1
LHOOK LAP DISP 36CM (ELECTROSURGICAL) ×1 IMPLANT
NDL 22X1.5 STRL (OR ONLY) (MISCELLANEOUS) ×1 IMPLANT
NEEDLE 22X1.5 STRL (OR ONLY) (MISCELLANEOUS) ×1 IMPLANT
NS IRRIG 1000ML POUR BTL (IV SOLUTION) ×1 IMPLANT
PAD ARMBOARD 7.5X6 YLW CONV (MISCELLANEOUS) ×1 IMPLANT
PENCIL BUTTON HOLSTER BLD 10FT (ELECTRODE) ×1 IMPLANT
POUCH RETRIEVAL ECOSAC 10 (ENDOMECHANICALS) ×1 IMPLANT
POUCH RETRIEVAL ECOSAC 10MM (ENDOMECHANICALS) ×1
SCISSORS LAP 5X35 DISP (ENDOMECHANICALS) ×1 IMPLANT
SET IRRIG TUBING LAPAROSCOPIC (IRRIGATION / IRRIGATOR) ×1 IMPLANT
SET TUBE SMOKE EVAC HIGH FLOW (TUBING) ×1 IMPLANT
SLEEVE ENDOPATH XCEL 5M (ENDOMECHANICALS) ×2 IMPLANT
SPECIMEN JAR SMALL (MISCELLANEOUS) ×1 IMPLANT
SUT VIC AB 4-0 PS2 27 (SUTURE) ×1 IMPLANT
TOWEL GREEN STERILE (TOWEL DISPOSABLE) ×1 IMPLANT
TOWEL GREEN STERILE FF (TOWEL DISPOSABLE) ×1 IMPLANT
TRAY LAPAROSCOPIC MC (CUSTOM PROCEDURE TRAY) ×1 IMPLANT
TROCAR XCEL BLUNT TIP 100MML (ENDOMECHANICALS) ×1 IMPLANT
TROCAR Z THREAD OPTICAL 12X100 (TROCAR) IMPLANT
TROCAR Z-THREAD OPTICAL 5X100M (TROCAR) ×1 IMPLANT
WARMER LAPAROSCOPE (MISCELLANEOUS) ×1 IMPLANT
WATER STERILE IRR 1000ML POUR (IV SOLUTION) ×1 IMPLANT

## 2021-12-26 NOTE — Op Note (Signed)
12/26/2021  10:48 AM  PATIENT:  Brendan Butler  71 y.o. male  PRE-OPERATIVE DIAGNOSIS:  acute cholecystitis  POST-OPERATIVE DIAGNOSIS:  acute cholecystitis  PROCEDURE:  Procedure(s): LAPAROSCOPIC LYSIS OF ADHESIONS 60MIN LAPAROSCOPIC CHOLECYSTECTOMY  SURGEON:  Surgeon(s): [name], MD  ASSISTANTS: none   ANESTHESIA:   local and general  EBL:  Total I/O In: 750 [I.V.:500; IV Piggyback:250] Out: 400 [Urine:400]  BLOOD ADMINISTERED:none  DRAINS: none   SPECIMEN:  Excision  DISPOSITION OF SPECIMEN:  N/A  COUNTS:  YES  DICTATION: .Dragon Dictation Findings: Significant, widespread intra-abdominal adhesions, severe acute cholecystitis  Procedure in detail: Informed consent was obtained.  He was brought to the operating room and general endotracheal anesthesia was administered by the anesthesia staff.  He is receiving IV antibiotics.  His abdomen was prepped and draped in a sterile fashion.  We did a timeout procedure.  I injected local along his costal margin in the right upper quadrant.  I made a small incision and then used a 5 mm trocar with Optiview technique to place the trocar into the abdomen.  It went easily without difficulty and then I was able to insufflate the abdomen.  Inspection via the trocar revealed no complications.  The abdomen insufflated well and I was able to explore.  There were significant midline adhesions as well as adhesions in the right upper quadrant and the gallbladder was not visible.  There was a free space to place a 5 mm trocar a few centimeters to the right side of the umbilicus.  Local was used and then I made a small incision and placed the trocar there.  I was able to take down some of the adhesions in the right upper quadrant.  I then inspected the area near the epigastrium.  I could see the falciform and I was able to have a free space to place a 12 mm trocar there under direct vision without difficulty.  Further lysis of adhesions  was done over on the right side of the abdomen carefully I was then able to place another 5 mm trocar.  The at this point extensive adhesiolysis continued with adhesions continuing over the top of the liver and then in the right upper quadrant.  Once those were taken down I took down the omental adhesions that were completely encasing the gallbladder.  I was able to free up the dome and retract the gallbladder superiorly.  Further adhesiolysis exposed the infundibulum.  The duodenum was adherent to the gallbladder and I very gently swept this away.  Most of those adhesions were filmy fortunately.  I was then able to retract the infundibulum laterally and dissection began laterally and progressed medially at the infundibulum.  The cystic duct was noted to be very short and I visualized the common bile duct.  I stayed away from the common bile duct and created a window around the cystic duct.  I was able to achieve good visualization and 4 clips were placed proximally on the cystic duct, 1 was placed distally and I divided it.  Further dissection revealed the cystic artery.  This was clipped twice proximally and then divided distally with cautery.  I began taking down the gallbladder from the liver bed.  I encountered a posterior branch of the cystic artery which was clipped twice proximally and divided distally with cautery.  Gallbladder was extremely inflamed..  I changed the approach to a dome down at this point and was able to take the gallbladder off of the  liver bed using cautery leaving a small piece of the body on the liver bed.  Due to his antiplatelet therapy, I felt it was better to just leave this in place instead of causing excessive bleeding.  The remainder of the gallbladder was removed. Hemostasis was obtained with cautery.  I placed the gallbladder in an Endo Catch bag and removed it from the abdomen.  It was sent to pathology.  The liver bed was copiously irrigated.  I used cautery to get further  hemostasis.  I then placed a piece of Surgicel snow.  The area then looked dry.  Next, I closed the epigastric port site using a PMI and 0 Vicryl suture.  This closed up nicely.  The abdomen was then explored and no complications were noted.  Ports were removed under direct vision.  Pneumoperitoneum was released.  All 4 wounds were irrigated and the skin of each was closed with 4-0 Vicryl followed by Dermabond.  All counts were correct.  He tolerated the procedure well without apparent complication and was taken recovery in stable condition. PATIENT DISPOSITION:  PACU - hemodynamically stable.   Delay start of Pharmacological VTE agent (>24hrs) due to surgical blood loss or risk of bleeding:  no  [name], MD, MPH, FACS Pager: [phone]  10/31/202310:48 AM

## 2021-12-26 NOTE — Discharge Instructions (Signed)
[facility]  Please arrive at least 30 min before your appointment to complete your check in paperwork.  If you are unable to arrive 30 min prior to your appointment time we may have to cancel or reschedule you. LAPAROSCOPIC SURGERY: POST OP INSTRUCTIONS Always review your discharge instruction sheet given to you by the facility where your surgery was performed. IF YOU HAVE DISABILITY OR FAMILY LEAVE FORMS, YOU MUST BRING THEM TO THE OFFICE FOR PROCESSING.   DO NOT GIVE THEM TO YOUR DOCTOR.  PAIN CONTROL  First take acetaminophen (Tylenol) AND/or ibuprofen (Advil) to control your pain after surgery.  Follow directions on package.  Taking acetaminophen (Tylenol) and/or ibuprofen (Advil) regularly after surgery will help to control your pain and lower the amount of prescription pain medication you may need.  You should not take more than 4,000 mg (4 grams) of acetaminophen (Tylenol) in 24 hours.  You should not take ibuprofen (Advil), aleve, motrin, naprosyn or other NSAIDS if you have a history of stomach ulcers or chronic kidney disease.  A prescription for pain medication may be given to you upon discharge.  Take your pain medication as prescribed, if you still have uncontrolled pain after taking acetaminophen (Tylenol) or ibuprofen (Advil). Use ice packs to help control pain. If you need a refill on your pain medication, please contact your pharmacy.  They will contact our office to request authorization. Prescriptions will not be filled after 5pm or on week-ends.  HOME MEDICATIONS Take your usually prescribed medications unless otherwise directed.  DIET You should follow a light diet the first few days after arrival home.  Be sure to include lots of fluids daily. Avoid fatty, fried foods.   CONSTIPATION It is common to experience some constipation after surgery and if you are taking pain medication.  Increasing fluid intake and taking a stool softener (such as Colace)  will usually help or prevent this problem from occurring.  A mild laxative (Milk of Magnesia or Miralax) should be taken according to package instructions if there are no bowel movements after 48 hours.  WOUND/INCISION CARE Most patients will experience some swelling and bruising in the area of the incisions.  Ice packs will help.  Swelling and bruising can take several days to resolve.  Unless discharge instructions indicate otherwise, follow guidelines below  STERI-STRIPS - you may remove your outer bandages 48 hours after surgery, and you may shower at that time.  You have steri-strips (small skin tapes) in place directly over the incision.  These strips should be left on the skin for 7-10 days.   DERMABOND/SKIN GLUE - you may shower in 24 hours.  The glue will flake off over the next 2-3 weeks. Any sutures or staples will be removed at the office during your follow-up visit.  ACTIVITIES You may resume regular (light) daily activities beginning the next day--such as daily self-care, walking, climbing stairs--gradually increasing activities as tolerated.  You may have sexual intercourse when it is comfortable.  Refrain from any heavy lifting or straining until approved by your doctor. You may drive when you are no longer taking prescription pain medication, you can comfortably wear a seatbelt, and you can safely maneuver your car and apply brakes.  FOLLOW-UP You should see your doctor in the office for a follow-up appointment approximately 2-3 weeks after your surgery.  You should have been given your post-op/follow-up appointment when your surgery was scheduled.  If you did not receive a post-op/follow-up appointment, make sure   that you call for this appointment within a day or two after you arrive home to insure a convenient appointment time.   WHEN TO CALL YOUR DOCTOR: Fever over 101.0 Inability to urinate Continued bleeding from incision. Increased pain, redness, or drainage from the  incision. Increasing abdominal pain  The clinic staff is available to answer your questions during regular business hours.  Please don't hesitate to call and ask to speak to one of the nurses for clinical concerns.  If you have a medical emergency, go to the nearest emergency room or call 911.  A surgeon from [facility] is always on call at the hospital. [address], [address], [place], [place]  [address] ? [address] [address], [place], [place]   [address] [phone] ? [phone] ? FAX [phone]  

## 2021-12-26 NOTE — Progress Notes (Signed)
Daughter at bedside and spoke to myself and Samuela, the night shift Therapist, sports. We reassured her. Pt is beginning to settle down and becoming more relaxed and sleeping some.

## 2021-12-26 NOTE — Progress Notes (Signed)
Pt very biligerent and wanting to leave. Daughter, Anderson Malta called and stated that she had asked the doctors for a PRN med ( Haldol). 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 She did say that he was addicted to weed. I asked Anderson Malta to talk to him and they talked for 15 minutes. Haldol given.

## 2021-12-26 NOTE — Plan of Care (Signed)
  Problem: Education: Goal: Knowledge of General Education information will improve Description Including pain rating scale, medication(s)/side effects and non-pharmacologic comfort measures Outcome: Progressing   Problem: Clinical Measurements: Goal: Ability to maintain clinical measurements within normal limits will improve Outcome: Progressing   Problem: Activity: Goal: Risk for activity intolerance will decrease Outcome: Progressing   Problem: Coping: Goal: Level of anxiety will decrease Outcome: Progressing   Problem: Pain Managment: Goal: General experience of comfort will improve Outcome: Progressing   Problem: Safety: Goal: Ability to remain free from injury will improve Outcome: Progressing   
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
## 2021-12-26 NOTE — Progress Notes (Signed)
Subjective:  Denies SSCP, palpitations or Dyspnea For GB surgery today with Dr Janee Morn  Objective:  Vitals:   12/25/21 1806 12/25/21 2153 12/25/21 2332 12/26/21 0448  BP: 137/62 (!) 120/58 125/68 (!) 138/56  Pulse: 75 85 83 76  Resp: 18 17 18 18   Temp: 97.9 F (36.6 C) 98.4 F (36.9 C) 97.8 F (36.6 C) 98.1 F (36.7 C)  TempSrc: Oral Oral Oral Oral  SpO2: 98% 100% 96% 98%  Weight:      Height:        Intake/Output from previous day:  Intake/Output Summary (Last 24 hours) at 12/26/2021 0655 Last data filed at 12/26/2021 0005 Gross per 24 hour  Intake 1347.37 ml  Output --  Net 1347.37 ml    Physical Exam: Affect appropriate Healthy:  appears stated age HEENT: normal Neck supple with no adenopathy JVP normal no bruits no thyromegaly Lungs rhonchi exp wheeze Heart:  S1/S2 no murmur, no rub, gallop or click PMI normal Abdomen: RUQ pain no rebound or distension Distal pulses intact with no bruits No edema Neuro non-focal Skin warm and dry No muscular weakness   Lab Results: Basic Metabolic Panel: Recent Labs    12/24/21 2133 12/25/21 0421  NA 140  --   K 3.1*  --   CL 106  --   CO2 20*  --   GLUCOSE 144*  --   BUN 23  --   CREATININE 1.81*  --   CALCIUM 9.5  --   MG  --  2.2   Liver Function Tests: Recent Labs    12/24/21 2133  AST 24  ALT 19  ALKPHOS 98  BILITOT 1.1  PROT 7.0  ALBUMIN 3.7   No results for input(s): "LIPASE", "AMYLASE" in the last 72 hours. CBC: Recent Labs    12/24/21 2133  WBC 15.0*  NEUTROABS 8.8*  HGB 15.1  HCT 43.0  MCV 90.5  PLT 374     Imaging: CT CHEST WO CONTRAST  Result Date: 12/25/2021 CLINICAL DATA:  Pulmonary nodule EXAM: CT CHEST WITHOUT CONTRAST TECHNIQUE: Multidetector CT imaging of the chest was performed following the standard protocol without IV contrast. RADIATION DOSE REDUCTION: This exam was performed according to the departmental dose-optimization program which includes automated  exposure control, adjustment of the mA and/or kV according to patient size and/or use of iterative reconstruction technique. COMPARISON:  01/16/2021 FINDINGS: Cardiovascular: Coronary, aortic arch, and branch vessel atherosclerotic vascular disease. Mediastinum/Nodes: Unremarkable Lungs/Pleura: Airway thickening is present, suggesting bronchitis or reactive airways disease. Mixed density partially cavitary nodule posteriorly in the right lower lobe on image 100 series 4 measuring 2.3 by 1.4 cm, previously 2.3 by 1.2 cm and previously with less dense/solid components. Ground-glass density nodule in the posterior basal segment right lower lobe 1.2 by 0.9 cm on image 128 series 4, formerly 1.0 by 0.7 cm. Dependent reticulation and architectural irregularity in the posterior basal segment and superior segment right lower lobe. Faint ground-glass opacity along the margin of a small bulla or air cyst in the right upper lobe on image 52 series 4, stable with ground-glass component measuring 1.5 by 0.9 cm on image 54 series 4. Upper Abdomen: 2.2 cm gallstone in the gallbladder. Abdominal aortic atherosclerosis. Postoperative findings from prior left nephrectomy. Musculoskeletal: Since 01/16/2021 there has been an interval 25% superior endplate compression fracture the L1 vertebral level without significant bony retropulsion or posterior element involvement. This is likely subacute. Mild paraspinal edema example on image 167 series 3. IMPRESSION:  1. The mixed density partially cavitary nodule posteriorly in the right lower lobe has mildly increased in size, currently 2.3 by 1.4 cm and with some increase in solidity. Suspicious for potential low-grade adenocarcinoma. 2. Ground-glass density nodule in the posterior basal segment right lower lobe has mildly increased in size, currently 1.2 by 0.9 cm, previously 1.0 by 0.7 cm. Also possible low-grade adenocarcinoma. 3. Airway thickening is present, suggesting bronchitis or  reactive airways disease. 4. 25% superior endplate compression fracture the L1 vertebral level, likely subacute, with mild paraspinal edema. 5. Coronary, aortic arch, and branch vessel atherosclerotic vascular disease. 6. Cholelithiasis. 7. Postoperative findings from prior left nephrectomy. 8. Aortic atherosclerosis. 9. Dependent reticulation and architectural irregularity in the posterior basal segment and superior segment right lower lobe, probably from scarring. 10. Faint ground-glass opacity along the margin of a small bulla or air cyst in the right upper lobe measuring 1.5 by 0.9 cm, stable. Aortic Atherosclerosis (ICD10-I70.0). Electronically Signed   By: Gaylyn Rong M.D.   On: 12/25/2021 20:03   US Abdomen Limited RUQ (LIVER/GB)  Result Date: 12/25/2021 CLINICAL DATA:  Abdominal pain. EXAM: ULTRASOUND ABDOMEN LIMITED RIGHT UPPER QUADRANT COMPARISON:  10/27/21 FINDINGS: Gallbladder: Gallbladder is suboptimally visualized on this exam. Gallstones are again noted, which were previously reported as measuring up to 2.2 cm. There is gallbladder wall thickening measuring up to 5.2 mm. A positive sonographic Murphy's sign was reported. Common bile duct: Diameter: 6.5 mm.  No intrahepatic bile duct dilatation. Liver: No focal lesion identified. Increase in parenchymal echogenicity. Portal vein is patent on color Doppler imaging with normal direction of blood flow towards the liver. Other: Diminished exam detail. Difficulty visualizing gallbladder on today's exam. IMPRESSION: 1. Gallbladder wall thickening and gallstones. Positive sonographic Murphy's sign reported. 2. Common bile duct measures 6.5 mm which is upper limits of normal. No intrahepatic bile duct dilatation. 3. Hepatic steatosis. Electronically Signed   By: Signa Kell M.D.   On: 12/25/2021 06:21   CT Head Wo Contrast  Result Date: 12/24/2021 CLINICAL DATA:  Mental status change, unknown cause EXAM: CT HEAD WITHOUT CONTRAST TECHNIQUE:  Contiguous axial images were obtained from the base of the skull through the vertex without intravenous contrast. RADIATION DOSE REDUCTION: This exam was performed according to the departmental dose-optimization program which includes automated exposure control, adjustment of the mA and/or kV according to patient size and/or use of iterative reconstruction technique. COMPARISON:  None Available. FINDINGS: Brain: There is atrophy and chronic small vessel disease changes. Old left basal ganglia lacunar infarcts. No acute intracranial abnormality. Specifically, no hemorrhage, hydrocephalus, mass lesion, acute infarction, or significant intracranial injury. Vascular: No hyperdense vessel or unexpected calcification. Skull: No acute calvarial abnormality. Sinuses/Orbits: No acute findings Other: None IMPRESSION: Old left basal ganglia lacunar infarcts. Atrophy, chronic microvascular disease. No acute intracranial abnormality. Electronically Signed   By: Charlett Nose M.D.   On: 12/24/2021 23:38    Cardiac Studies:  ECG: SR/Sinus arrhythmia non specific ST changes    Telemetry:  Echo: pending  Medications:    buPROPion  450 mg Oral Daily   Chlorhexidine Gluconate Cloth  6 each Topical Once   fluticasone  1 spray Each Nare Daily   PARoxetine  30 mg Oral Daily   pregabalin  450 mg Oral Daily   rosuvastatin  10 mg Oral Daily      lactated ringers 75 mL/hr at 12/25/21 2338   piperacillin-tazobactam (ZOSYN)  IV 3.375 g (12/26/21 0640)    Assessment/Plan:  Brendan Butler is a 71 y.o. male with a hx of STEMI 2014-with PCI to LAD, repeat STEMI 1 week later presented with STEMI + Vfib with cath, new in stent thrombosis, HLD, HTN , lymphocytosis for 14 years   who is being seen 12/25/2021 for the evaluation of pre-op and bradycardia at the request of Dr. Carmelina Peal. Kieth Brightly.  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 Non contrast CT  [name] 12/26/2021, 6:55 AM

## 2021-12-26 NOTE — Plan of Care (Signed)

## 2021-12-26 NOTE — Anesthesia Preprocedure Evaluation (Signed)
Anesthesia Evaluation  Patient identified by MRN, date of birth, ID band Patient awake    Reviewed: Allergy & Precautions, H&P , NPO status , Patient's Chart, lab work & pertinent test results  Airway Mallampati: II   Neck ROM: full    Dental   Pulmonary sleep apnea , former smoker,    breath sounds clear to auscultation       Cardiovascular hypertension, + angina + CAD, + Past MI and + Cardiac Stents   Rhythm:regular Rate:Normal     Neuro/Psych    GI/Hepatic   Endo/Other    Renal/GU Renal InsufficiencyRenal disease     Musculoskeletal  (+) Arthritis ,   Abdominal   Peds  Hematology   Anesthesia Other Findings   Reproductive/Obstetrics                             Anesthesia Physical Anesthesia Plan  ASA: 3  Anesthesia Plan: General   Post-op Pain Management:    Induction: Intravenous  PONV Risk Score and Plan: 2 and Ondansetron, Dexamethasone, Midazolam and Treatment may vary due to age or medical condition  Airway Management Planned: Oral ETT  Additional Equipment:   Intra-op Plan:   Post-operative Plan: Extubation in OR  Informed Consent: I have reviewed the patients History and Physical, chart, labs and discussed the procedure including the risks, benefits and alternatives for the proposed anesthesia with the patient or authorized representative who has indicated his/her understanding and acceptance.     Dental advisory given  Plan Discussed with: CRNA, Anesthesiologist and Surgeon  Anesthesia Plan Comments:         Anesthesia Quick Evaluation

## 2021-12-26 NOTE — Care Management Obs Status (Signed)
[facility] NOTIFICATION   Patient Details  Name: NORWOOD QUEZADA MRN: [id number] Date of Birth: [birth year]   Medicare Observation Status Notification Given:  Yes    Marilu Favre, RN 12/26/2021, 3:07 PM

## 2021-12-26 NOTE — Progress Notes (Signed)
  Echocardiogram 2D Echocardiogram has been performed.  Brendan Butler 12/26/2021, 3:51 PM

## 2021-12-26 NOTE — Transfer of Care (Signed)
Immediate Anesthesia Transfer of Care Note  Patient: Brendan Butler  Procedure(s) Performed: LAPAROSCOPIC CHOLECYSTECTOMY  Patient Location: PACU  Anesthesia Type:General  Level of Consciousness: drowsy  Airway & Oxygen Therapy: Patient Spontanous Breathing and Patient connected to face mask oxygen  Post-op Assessment: Report given to RN and Post -op Vital signs reviewed and stable  Post vital signs: Reviewed and stable  Last Vitals:  Vitals Value Taken Time  BP 179/55 12/26/21 1051  Temp 36.7 C 12/26/21 1051  Pulse 75 12/26/21 1055  Resp 22 12/26/21 1055  SpO2 93 % 12/26/21 1055  Vitals shown include unvalidated device data.  Last Pain:  Vitals:   12/26/21 1051  TempSrc:   PainSc: Asleep      Patients Stated Pain Goal: 1 (65/46/50 3546)  Complications: No notable events documented.

## 2021-12-26 NOTE — Care Management CC44 (Signed)
Condition Code 44 Documentation Completed  Patient Details  Name: Brendan Butler MRN: [id number] Date of Birth: 03-19-1950   Condition Code 44 given:  Yes Patient signature on Condition Code 44 notice:  Yes Documentation of 2 MD's agreement:  Yes Code 44 added to claim:  Yes    Marilu Favre, RN 12/26/2021, 3:07 PM

## 2021-12-26 NOTE — Progress Notes (Addendum)
Day of Surgery    Subjective: Less RUQ pain ROS negative except as listed above. Objective: Vital signs in last 24 hours: Temp:  [97.6 F (36.4 C)-98.4 F (36.9 C)] 98 F (36.7 C) (10/31 0823) Pulse Rate:  [67-85] 75 (10/31 0823) Resp:  [16-22] 18 (10/31 0823) BP: (111-167)/(56-72) 160/61 (10/31 0823) SpO2:  [96 %-100 %] 97 % (10/31 0823) Weight:  [101 kg-101.6 kg] 101 kg (10/31 0823) Last BM Date : 12/24/21  Intake/Output from previous day: 10/30 0701 - 10/31 0700 In: 1347.4 [I.V.:177.4; IV Piggyback:1169.9] Out: -  Intake/Output this shift: Total I/O In: 0  Out: 400 [Urine:400]  General appearance: alert and cooperative Resp: clear to auscultation bilaterally Cardio: regular rate and rhythm GI: soft, mild TTP RUQ  Lab Results: CBC  Recent Labs    12/24/21 2133 12/26/21 0255  WBC 15.0* 21.7*  HGB 15.1 12.1*  HCT 43.0 35.2*  PLT 374 291   BMET Recent Labs    12/24/21 2133 12/26/21 0255  NA 140 141  K 3.1* 3.4*  CL 106 105  CO2 20* 22  GLUCOSE 144* 87  BUN 23 26*  CREATININE 1.81* 1.84*  CALCIUM 9.5 8.7*   PT/INR No results for input(s): "LABPROT", "INR" in the last 72 hours. ABG No results for input(s): "PHART", "HCO3" in the last 72 hours.  Invalid input(s): "PCO2", "PO2"  Studies/Results: CT CHEST WO CONTRAST  Result Date: 12/25/2021 CLINICAL DATA:  Pulmonary nodule EXAM: CT CHEST WITHOUT CONTRAST TECHNIQUE: Multidetector CT imaging of the chest was performed following the standard protocol without IV contrast. RADIATION DOSE REDUCTION: This exam was performed according to the departmental dose-optimization program which includes automated exposure control, adjustment of the mA and/or kV according to patient size and/or use of iterative reconstruction technique. COMPARISON:  01/16/2021 FINDINGS: Cardiovascular: Coronary, aortic arch, and branch vessel atherosclerotic vascular disease. Mediastinum/Nodes: Unremarkable Lungs/Pleura: Airway thickening  is present, suggesting bronchitis or reactive airways disease. Mixed density partially cavitary nodule posteriorly in the right lower lobe on image 100 series 4 measuring 2.3 by 1.4 cm, previously 2.3 by 1.2 cm and previously with less dense/solid components. Ground-glass density nodule in the posterior basal segment right lower lobe 1.2 by 0.9 cm on image 128 series 4, formerly 1.0 by 0.7 cm. Dependent reticulation and architectural irregularity in the posterior basal segment and superior segment right lower lobe. Faint ground-glass opacity along the margin of a small bulla or air cyst in the right upper lobe on image 52 series 4, stable with ground-glass component measuring 1.5 by 0.9 cm on image 54 series 4. Upper Abdomen: 2.2 cm gallstone in the gallbladder. Abdominal aortic atherosclerosis. Postoperative findings from prior left nephrectomy. Musculoskeletal: Since 01/16/2021 there has been an interval 25% superior endplate compression fracture the L1 vertebral level without significant bony retropulsion or posterior element involvement. This is likely subacute. Mild paraspinal edema example on image 167 series 3. IMPRESSION: 1. The mixed density partially cavitary nodule posteriorly in the right lower lobe has mildly increased in size, currently 2.3 by 1.4 cm and with some increase in solidity. Suspicious for potential low-grade adenocarcinoma. 2. Ground-glass density nodule in the posterior basal segment right lower lobe has mildly increased in size, currently 1.2 by 0.9 cm, previously 1.0 by 0.7 cm. Also possible low-grade adenocarcinoma. 3. Airway thickening is present, suggesting bronchitis or reactive airways disease. 4. 25% superior endplate compression fracture the L1 vertebral level, likely subacute, with mild paraspinal edema. 5. Coronary, aortic arch, and branch vessel atherosclerotic vascular disease.  6. Cholelithiasis. 7. Postoperative findings from prior left nephrectomy. 8. Aortic atherosclerosis.  9. Dependent reticulation and architectural irregularity in the posterior basal segment and superior segment right lower lobe, probably from scarring. 10. Faint ground-glass opacity along the margin of a small bulla or air cyst in the right upper lobe measuring 1.5 by 0.9 cm, stable. Aortic Atherosclerosis (ICD10-I70.0). Electronically Signed   By: Van Clines M.D.   On: 12/25/2021 20:03   US Abdomen Limited RUQ (LIVER/GB)  Result Date: 12/25/2021 CLINICAL DATA:  Abdominal pain. EXAM: ULTRASOUND ABDOMEN LIMITED RIGHT UPPER QUADRANT COMPARISON:  10/27/21 FINDINGS: Gallbladder: Gallbladder is suboptimally visualized on this exam. Gallstones are again noted, which were previously reported as measuring up to 2.2 cm. There is gallbladder wall thickening measuring up to 5.2 mm. A positive sonographic Murphy's sign was reported. Common bile duct: Diameter: 6.5 mm.  No intrahepatic bile duct dilatation. Liver: No focal lesion identified. Increase in parenchymal echogenicity. Portal vein is patent on color Doppler imaging with normal direction of blood flow towards the liver. Other: Diminished exam detail. Difficulty visualizing gallbladder on today's exam. IMPRESSION: 1. Gallbladder wall thickening and gallstones. Positive sonographic Murphy's sign reported. 2. Common bile duct measures 6.5 mm which is upper limits of normal. No intrahepatic bile duct dilatation. 3. Hepatic steatosis. Electronically Signed   By: Kerby Moors M.D.   On: 12/25/2021 06:21   CT Head Wo Contrast  Result Date: 12/24/2021 CLINICAL DATA:  Mental status change, unknown cause EXAM: CT HEAD WITHOUT CONTRAST TECHNIQUE: Contiguous axial images were obtained from the base of the skull through the vertex without intravenous contrast. RADIATION DOSE REDUCTION: This exam was performed according to the departmental dose-optimization program which includes automated exposure control, adjustment of the mA and/or kV according to patient size  and/or use of iterative reconstruction technique. COMPARISON:  None Available. FINDINGS: Brain: There is atrophy and chronic small vessel disease changes. Old left basal ganglia lacunar infarcts. No acute intracranial abnormality. Specifically, no hemorrhage, hydrocephalus, mass lesion, acute infarction, or significant intracranial injury. Vascular: No hyperdense vessel or unexpected calcification. Skull: No acute calvarial abnormality. Sinuses/Orbits: No acute findings Other: None IMPRESSION: Old left basal ganglia lacunar infarcts. Atrophy, chronic microvascular disease. No acute intracranial abnormality. Electronically Signed   By: Rolm Baptise M.D.   On: 12/24/2021 23:38    Anti-infectives: Anti-infectives (From admission, onward)    Start     Dose/Rate Route Frequency Ordered Stop   12/25/21 1045  [MAR Hold]  piperacillin-tazobactam (ZOSYN) IVPB 3.375 g        (MAR Hold since Tue 12/26/2021 at 0821.Hold Reason: Transfer to a Procedural area)   3.375 g 12.5 mL/hr over 240 Minutes Intravenous Every 8 hours 12/25/21 1029         Assessment/Plan: Acute cholecystitis -appreciate Cardiology clearance -Effient does not wear off for 7d so there is increased risk of bleeding. I D/W Coralyn Mark and we should not wait that long with his acute cholecystitis and rising WBC count. Will proceed with laparoscopic cholecystectomy. Procedure, risks, and benefits discussed again and he agrees. -Zosyn IV  LOS: 1 day    [name], MD, MPH, FACS Trauma & General Surgery Use [email or web address] to contact on call provider  12/26/2021

## 2021-12-26 NOTE — Consult Note (Signed)
NAME:  Brendan Butler, MRN:  [id number], DOB:  1950/05/18, LOS: 1 ADMISSION DATE:  12/24/2021, CONSULTATION DATE:  10/31 REFERRING MD:  Doristine Bosworth [facility]), CHIEF COMPLAINT:  pulmonary nodule    History of Present Illness:  71yo male former smoker (2ppd x30 years quit 2001) with extensive cardiac hx including CAD s/p stent (2014 with VF arrest), HTN, systolic CHF, OSA, previously seen 12/2020 for known RLL pulmonary nodules but pt left AMA before further w/u could be arranged.  He returned 10/29 with abd pain and was found to have symptomatic cholelithiasis for which he underwent lap cholecystectomy 10/31. CT chest this admission concerning for increased size of previously known nodules.  Ground glass nodule has increased in size, cavitary nodule more difficult to determine if truly increased.  Previous admission last year pt also had c/o weight loss.  Pertinent  Medical History   has a past medical history of Acute myocardial infarction of other anterior wall, initial episode of care (12/29/12), Acute ST segment elevation MI ([place]) (01/05/13), Coronary artery disease (2001), Echocardiogram abnormal (01/06/13), Erectile dysfunction, Hyperlipidemia LDL goal <70, Hypertension, and OSA (obstructive sleep apnea).   Significant Hospital Events: Including procedures, antibiotic start and stop dates in addition to other pertinent events     Interim History / Subjective:  Seen post op - awake, alert, in bed. Feels "sore" but no specific c/o.  Denies SOB, cough, chest pain.  He quit smoking 2001  Worked as a Building control surveyor for many years   Objective   Blood pressure (!) 159/70, pulse 71, temperature 98.6 F (37 C), temperature source Oral, resp. rate 17, height _0  (1.778 m), weight 101 kg, SpO2 96 %.        Intake/Output Summary (Last 24 hours) at 12/26/2021 1401 Last data filed at 12/26/2021 1045 Gross per 24 hour  Intake 1047.37 ml  Output 420 ml  Net 627.37 ml   Filed Weights   12/25/21 1019  12/26/21 0823  Weight: 101.6 kg 101 kg    Examination: General: wdwn male, NAD in bed post lap choley  HENT: mm moist, no JVD  Lungs: resps even non labored, clear  Cardiovascular: s1s2 rrr Abdomen: soft, lap sites c/d, tender  Extremities: warm and dry, no edema  Neuro: awake, alert, appropriate, MAE    Assessment & Plan:  RLL Pulmonary nodules - cavitary nodule has definitely increased in size, small ground glass nodule more difficult to determine but also seems larger. Has >60 pack years smoking hx, weight loss. Concern for malignancy although cannot r/o MAC, TB, other infectious etiology.  PLAN -  Plan for ENB while inpt - discussed at length with pt and family, he is agreeable and agrees to stay until procedure can be done - will tentatively schedule 11/2 midday with Dr. Lamonte Sakai  Best Practice (right click and "Reselect all SmartList Selections" daily)   Diet/type: NPO DVT prophylaxis: SCD GI prophylaxis: N/A Lines: N/A Foley:  N/A Code Status:  full code Last date of multidisciplinary goals of care discussion _1   Labs   CBC: Recent Labs  Lab 12/24/21 2133 12/26/21 0255  WBC 15.0* 21.7*  NEUTROABS 8.8*  --   HGB 15.1 12.1*  HCT 43.0 35.2*  MCV 90.5 91.7  PLT 374 956    Basic Metabolic Panel: Recent Labs  Lab 12/24/21 2133 12/25/21 0421 12/26/21 0255  NA 140  --  141  K 3.1*  --  3.4*  CL 106  --  105  CO2 20*  --  22  GLUCOSE 144*  --  87  BUN 23  --  26*  CREATININE 1.81*  --  1.84*  CALCIUM 9.5  --  8.7*  MG  --  2.2  --    GFR: Estimated Creatinine Clearance: 45.8 mL/min (A) (by C-G formula based on SCr of 1.84 mg/dL (H)). Recent Labs  Lab 12/24/21 2133 12/26/21 0255  WBC 15.0* 21.7*    Liver Function Tests: Recent Labs  Lab 12/24/21 2133 12/26/21 0255  AST 24 18  ALT 19 17  ALKPHOS 98 82  BILITOT 1.1 1.1  PROT 7.0 5.7*  ALBUMIN 3.7 2.9*   No results for input(s): "LIPASE", "AMYLASE" in the last 168 hours. No results for  input(s): "AMMONIA" in the last 168 hours.  ABG    Component Value Date/Time   PHART 7.428 03/01/2010 0925   PCO2ART 36.0 03/01/2010 0925   PO2ART 55.0 (L) 03/01/2010 0925   HCO3 23.4 03/01/2010 0925   TCO2 20 01/05/2013 1245   ACIDBASEDEF 0.4 03/01/2010 0925   O2SAT 89.3 03/01/2010 0925     Coagulation Profile: No results for input(s): "INR", "PROTIME" in the last 168 hours.  Cardiac Enzymes: No results for input(s): "CKTOTAL", "CKMB", "CKMBINDEX", "TROPONINI" in the last 168 hours.  HbA1C: Hgb A1c MFr Bld  Date/Time Value Ref Range Status  01/28/2016 01:53 AM 5.6 4.8 - 5.6 % Final    Comment:    (NOTE)         Pre-diabetes: 5.7 - 6.4         Diabetes: >6.4         Glycemic control for adults with diabetes: <7.0   01/05/2013 12:45 PM 5.7 (H) <5.7 % Final    Comment:    (NOTE)                                                                       According to the ADA Clinical Practice Recommendations for 2011, when HbA1c is used as a screening test:  >=6.5%   Diagnostic of Diabetes Mellitus           (if abnormal result is confirmed) 5.7-6.4%   Increased risk of developing Diabetes Mellitus References:Diagnosis and Classification of Diabetes Mellitus,Diabetes MLYY,5035,46(FKCLE 1):S62-S69 and Standards of Medical Care in         Diabetes - 2011,Diabetes XNTZ,0017,49 (Suppl 1):S11-S61.    CBG: Recent Labs  Lab 12/24/21 2143  GLUCAP 148*    Review of Systems:   As per HPI - All other systems reviewed and were neg.    Past Medical History:  He,  has a past medical history of Acute myocardial infarction of other anterior wall, initial episode of care (12/29/12), Acute ST segment elevation MI ([place]) (01/05/13), Coronary artery disease (2001), Echocardiogram abnormal (01/06/13), Erectile dysfunction, Hyperlipidemia LDL goal <70, Hypertension, and OSA (obstructive sleep apnea).   Surgical History:   Past Surgical History:  Procedure Laterality Date   BACK SURGERY   2007   Decompression lumbar laminectomy and micro discectomy, L4-5   CARDIAC CATHETERIZATION  2007   patent stent to LAD and normal Cors   CORONARY ANGIOPLASTY WITH STENT PLACEMENT  2001   to LAD   CORONARY ANGIOPLASTY WITH STENT PLACEMENT  12/29/12  STEMI with promus DES to LAD   CORONARY ANGIOPLASTY WITH STENT PLACEMENT  01/05/13   STEMI with thrombosis in stent to LAD   LEFT HEART CATH N/A 12/29/2012   Procedure: LEFT HEART CATH;  Surgeon: Jettie Booze, MD;  Location: [facility] CATH LAB;  Service: Cardiovascular;  Laterality: N/A;   LEFT HEART CATHETERIZATION WITH CORONARY ANGIOGRAM N/A 01/05/2013   Procedure: LEFT HEART CATHETERIZATION WITH CORONARY ANGIOGRAM;  Surgeon: Blane Ohara, MD;  Location: [facility] CATH LAB;  Service: Cardiovascular;  Laterality: N/A;   LEFT HEART CATHETERIZATION WITH CORONARY ANGIOGRAM N/A 04/22/2013   Procedure: LEFT HEART CATHETERIZATION WITH CORONARY ANGIOGRAM;  Surgeon: Troy Sine, MD;  Location: [facility] CATH LAB;  Service: Cardiovascular;  Laterality: N/A;   PARTIAL NEPHRECTOMY Left 1975   PATIENT ONLY HAS ONE KIDNEY   PERCUTANEOUS CORONARY STENT INTERVENTION (PCI-S)  12/29/2012   Procedure: PERCUTANEOUS CORONARY STENT INTERVENTION (PCI-S);  Surgeon: Jettie Booze, MD;  Location: [facility] CATH LAB;  Service: Cardiovascular;;     Social History:   reports that he quit smoking about 22 years ago. His smoking use included cigarettes. He has a 50.00 pack-year smoking history. His smokeless tobacco use includes chew. He reports that he does not currently use alcohol. He reports that he does not use drugs.   Family History:  His family history includes Alzheimer's disease in his mother; CAD in his father.   Allergies Allergies  Allergen Reactions   Levitra [Vardenafil] Other (See Comments)    Blurred vision   Aspirin Hives   Clopidogrel Other (See Comments)    Not effective anti platelet med for patient-switched to effient   Lipitor [Atorvastatin] Other  (See Comments)    Muscle cramps; patient states he does not take medication at home     Home Medications  Prior to Admission medications   Medication Sig Start Date End Date Taking? Authorizing Provider  buPROPion (WELLBUTRIN XL) 150 MG 24 hr tablet Take 450 mg by mouth daily.   Yes [provider]  fluticasone (FLONASE) 50 MCG/ACT nasal spray Place 1 spray into both nostrils daily. 12/24/21  Yes [provider]  furosemide (LASIX) 20 MG tablet Take 1 tablet (20 mg total) by mouth daily. 01/02/13  Yes Simmons, Brittainy M, PA-C  lisinopril (ZESTRIL) 40 MG tablet Take 40 mg by mouth daily. 12/24/21  Yes [provider]  nitroGLYCERIN (NITROSTAT) 0.4 MG SL tablet Place 1 tablet (0.4 mg total) under the tongue every 5 (five) minutes x 3 doses as needed for chest pain. NEED OV. 05/09/15  Yes Lorretta Harp, MD  PARoxetine (PAXIL) 30 MG tablet Take 30 mg by mouth daily.   Yes [provider]  potassium chloride (KLOR-CON M) 10 MEQ tablet Take 10 mEq by mouth daily. 12/07/21  Yes [provider]  prasugrel (EFFIENT) 10 MG TABS tablet Take 1 tablet (10 mg total) by mouth daily. 03/11/13  Yes Lorretta Harp, MD  pregabalin (LYRICA) 150 MG capsule Take 450 mg by mouth daily. 11/20/21  Yes [provider]  rosuvastatin (CRESTOR) 10 MG tablet Take 10 mg by mouth daily. 12/12/15  Yes [provider]      Nickolas Madrid, NP Pulmonary/Critical Care Medicine  12/26/2021  2:01 PM

## 2021-12-26 NOTE — Anesthesia Procedure Notes (Signed)
Procedure Name: Intubation Date/Time: 12/26/2021 9:11 AM  Performed by: Lorie Phenix, CRNAPre-anesthesia Checklist: Patient identified, Emergency Drugs available, Suction available and Patient being monitored Patient Re-evaluated:Patient Re-evaluated prior to induction Oxygen Delivery Method: Circle system utilized Preoxygenation: Pre-oxygenation with 100% oxygen Induction Type: IV induction Ventilation: Mask ventilation without difficulty Laryngoscope Size: Mac and 4 Grade View: Grade I Tube type: Oral Tube size: 7.5 mm Number of attempts: 1 Airway Equipment and Method: Stylet Placement Confirmation: ETT inserted through vocal cords under direct vision, positive ETCO2 and breath sounds checked- equal and bilateral Secured at: 23 cm Tube secured with: Tape Dental Injury: Teeth and Oropharynx as per pre-operative assessment

## 2021-12-26 NOTE — Progress Notes (Addendum)
  Echocardiogram 2D Echocardiogram attempted at 0800. Pt is being transported to OR at this time. Will re-attempt as schedule permits.  Eartha Inch 12/26/2021, 8:09 AM

## 2021-12-27 ENCOUNTER — Encounter (HOSPITAL_COMMUNITY): Payer: Self-pay | Admitting: General Surgery

## 2021-12-27 DIAGNOSIS — F39 Unspecified mood [affective] disorder: Secondary | ICD-10-CM | POA: Diagnosis present

## 2021-12-27 DIAGNOSIS — I252 Old myocardial infarction: Secondary | ICD-10-CM | POA: Diagnosis not present

## 2021-12-27 DIAGNOSIS — I10 Essential (primary) hypertension: Secondary | ICD-10-CM | POA: Diagnosis not present

## 2021-12-27 DIAGNOSIS — G4733 Obstructive sleep apnea (adult) (pediatric): Secondary | ICD-10-CM | POA: Diagnosis present

## 2021-12-27 DIAGNOSIS — J849 Interstitial pulmonary disease, unspecified: Secondary | ICD-10-CM | POA: Diagnosis present

## 2021-12-27 DIAGNOSIS — E669 Obesity, unspecified: Secondary | ICD-10-CM | POA: Diagnosis present

## 2021-12-27 DIAGNOSIS — E785 Hyperlipidemia, unspecified: Secondary | ICD-10-CM | POA: Diagnosis present

## 2021-12-27 DIAGNOSIS — R001 Bradycardia, unspecified: Secondary | ICD-10-CM | POA: Diagnosis present

## 2021-12-27 DIAGNOSIS — Z886 Allergy status to analgesic agent status: Secondary | ICD-10-CM | POA: Diagnosis not present

## 2021-12-27 DIAGNOSIS — I130 Hypertensive heart and chronic kidney disease with heart failure and stage 1 through stage 4 chronic kidney disease, or unspecified chronic kidney disease: Secondary | ICD-10-CM | POA: Diagnosis present

## 2021-12-27 DIAGNOSIS — E876 Hypokalemia: Secondary | ICD-10-CM | POA: Diagnosis present

## 2021-12-27 DIAGNOSIS — Z955 Presence of coronary angioplasty implant and graft: Secondary | ICD-10-CM | POA: Diagnosis not present

## 2021-12-27 DIAGNOSIS — R918 Other nonspecific abnormal finding of lung field: Secondary | ICD-10-CM | POA: Diagnosis not present

## 2021-12-27 DIAGNOSIS — Z87891 Personal history of nicotine dependence: Secondary | ICD-10-CM | POA: Diagnosis not present

## 2021-12-27 DIAGNOSIS — Z1152 Encounter for screening for COVID-19: Secondary | ICD-10-CM | POA: Diagnosis not present

## 2021-12-27 DIAGNOSIS — I700 Atherosclerosis of aorta: Secondary | ICD-10-CM | POA: Diagnosis not present

## 2021-12-27 DIAGNOSIS — N1831 Chronic kidney disease, stage 3a: Secondary | ICD-10-CM | POA: Diagnosis present

## 2021-12-27 DIAGNOSIS — K760 Fatty (change of) liver, not elsewhere classified: Secondary | ICD-10-CM | POA: Diagnosis present

## 2021-12-27 DIAGNOSIS — R911 Solitary pulmonary nodule: Secondary | ICD-10-CM | POA: Diagnosis present

## 2021-12-27 DIAGNOSIS — N179 Acute kidney failure, unspecified: Secondary | ICD-10-CM | POA: Diagnosis present

## 2021-12-27 DIAGNOSIS — Z888 Allergy status to other drugs, medicaments and biological substances status: Secondary | ICD-10-CM | POA: Diagnosis not present

## 2021-12-27 DIAGNOSIS — I714 Abdominal aortic aneurysm, without rupture, unspecified: Secondary | ICD-10-CM | POA: Diagnosis present

## 2021-12-27 DIAGNOSIS — K8000 Calculus of gallbladder with acute cholecystitis without obstruction: Secondary | ICD-10-CM | POA: Diagnosis present

## 2021-12-27 DIAGNOSIS — Z905 Acquired absence of kidney: Secondary | ICD-10-CM | POA: Diagnosis not present

## 2021-12-27 DIAGNOSIS — I5022 Chronic systolic (congestive) heart failure: Secondary | ICD-10-CM | POA: Diagnosis present

## 2021-12-27 DIAGNOSIS — I2510 Atherosclerotic heart disease of native coronary artery without angina pectoris: Secondary | ICD-10-CM | POA: Diagnosis present

## 2021-12-27 DIAGNOSIS — Z9889 Other specified postprocedural states: Secondary | ICD-10-CM | POA: Diagnosis not present

## 2021-12-27 DIAGNOSIS — K810 Acute cholecystitis: Secondary | ICD-10-CM | POA: Diagnosis present

## 2021-12-27 DIAGNOSIS — J479 Bronchiectasis, uncomplicated: Secondary | ICD-10-CM | POA: Diagnosis present

## 2021-12-27 DIAGNOSIS — J9811 Atelectasis: Secondary | ICD-10-CM | POA: Diagnosis present

## 2021-12-27 LAB — CBC WITH DIFFERENTIAL/PLATELET
Abs Immature Granulocytes: 0.17 10*3/uL — ABNORMAL HIGH (ref 0.00–0.07)
Basophils Absolute: 0 10*3/uL (ref 0.0–0.1)
Basophils Relative: 0 %
Eosinophils Absolute: 0 10*3/uL (ref 0.0–0.5)
Eosinophils Relative: 0 %
HCT: 34.8 % — ABNORMAL LOW (ref 39.0–52.0)
Hemoglobin: 11.7 g/dL — ABNORMAL LOW (ref 13.0–17.0)
Immature Granulocytes: 1 %
Lymphocytes Relative: 8 %
Lymphs Abs: 1.8 10*3/uL (ref 0.7–4.0)
MCH: 31.4 pg (ref 26.0–34.0)
MCHC: 33.6 g/dL (ref 30.0–36.0)
MCV: 93.3 fL (ref 80.0–100.0)
Monocytes Absolute: 1.9 10*3/uL — ABNORMAL HIGH (ref 0.1–1.0)
Monocytes Relative: 8 %
Neutro Abs: 18.8 10*3/uL — ABNORMAL HIGH (ref 1.7–7.7)
Neutrophils Relative %: 83 %
Platelets: 276 10*3/uL (ref 150–400)
RBC: 3.73 MIL/uL — ABNORMAL LOW (ref 4.22–5.81)
RDW: 12.7 % (ref 11.5–15.5)
WBC: 22.7 10*3/uL — ABNORMAL HIGH (ref 4.0–10.5)
nRBC: 0 % (ref 0.0–0.2)

## 2021-12-27 LAB — BASIC METABOLIC PANEL
Anion gap: 8 (ref 5–15)
BUN: 18 mg/dL (ref 8–23)
CO2: 25 mmol/L (ref 22–32)
Calcium: 8.8 mg/dL — ABNORMAL LOW (ref 8.9–10.3)
Chloride: 108 mmol/L (ref 98–111)
Creatinine, Ser: 1.6 mg/dL — ABNORMAL HIGH (ref 0.61–1.24)
GFR, Estimated: 47 mL/min — ABNORMAL LOW (ref >60)
Glucose, Bld: 152 mg/dL — ABNORMAL HIGH (ref 70–99)
Potassium: 4.3 mmol/L (ref 3.5–5.1)
Sodium: 141 mmol/L (ref 135–145)

## 2021-12-27 LAB — SURGICAL PATHOLOGY

## 2021-12-27 NOTE — Progress Notes (Signed)
 NAME:  Brendan Butler, MRN:  [id number], DOB:  08/17/1950, LOS: 1 ADMISSION DATE:  12/24/2021, CONSULTATION DATE:  10/31 REFERRING MD:  Pahwani ([facility]), CHIEF COMPLAINT:  pulmonary nodule    History of Present Illness:  71yo male former smoker (2ppd x30 years quit 2001) with extensive cardiac hx including CAD s/p stent (2014 with VF arrest), HTN, systolic CHF, OSA, previously seen 12/2020 for known RLL pulmonary nodules but pt left AMA before further w/u could be arranged.  He returned 10/29 with abd pain and was found to have symptomatic cholelithiasis for which he underwent lap cholecystectomy 10/31. CT chest this admission concerning for increased size of previously known nodules.  Ground glass nodule has increased in size, cavitary nodule more difficult to determine if truly increased.  Previous admission last year pt also had c/o weight loss.  Pertinent  Medical History   has a past medical history of Acute myocardial infarction of other anterior wall, initial episode of care (12/29/12), Acute ST segment elevation MI ([facility]) (01/05/13), Coronary artery disease (2001), Echocardiogram abnormal (01/06/13), Erectile dysfunction, Hyperlipidemia LDL goal <70, Hypertension, and OSA (obstructive sleep apnea).   Significant Hospital Events: Including procedures, antibiotic start and stop dates in addition to other pertinent events     Interim History / Subjective:   Patient comfortable, interacting appropriately.  Apparently had some agitation overnight last night. Very mild abdominal discomfort but improved. To have him scheduled for bronchoscopy 11/2.  Case management has been helping with regard to whether he should be discharged and have this done as an outpatient  Objective   Blood pressure (!) 146/64, pulse (!) 56, temperature 97.6 F (36.4 C), temperature source Oral, resp. rate 17, height 5' 10" (1.778 m), weight 101 kg, SpO2 98 %.        Intake/Output Summary (Last 24 hours) at  12/27/2021 1107 Last data filed at 12/27/2021 0400 Gross per 24 hour  Intake 573.75 ml  Output 1400 ml  Net -826.25 ml   Filed Weights   12/25/21 1019 12/26/21 0823  Weight: 101.6 kg 101 kg    Examination: General: No distress, sleeping comfortably on his right side HENT: Oropharynx clear, strong voice, no secretions Lungs: Clear bilaterally Cardiovascular: Regular, no murmur Abdomen: Nondistended, lap sites look good, positive bowel sounds Extremities: No edema Neuro: Awake, appropriate, interacting.  Nonfocal   Assessment & Plan:  RLL Pulmonary nodules - cavitary nodule probably a bit bigger with a larger solid component.  The groundglass nodule is enlarging as well.  Has >60 pack years smoking hx, weight loss. Concern for malignancy although cannot r/o MAC, TB, other infectious etiology.  PLAN -  Patient scheduled for robotic navigational bronchoscopy on 11/2.  This can be done either as an inpatient or an outpatient as long as he agrees to come back.  I will make him n.p.o. at midnight assuming he will probably stay in inpatient   Best Practice (right click and "Reselect all SmartList Selections" daily)   Diet/type: NPO DVT prophylaxis: SCD GI prophylaxis: N/A Lines: N/A Foley:  N/A Code Status:  full code Last date of multidisciplinary goals of care discussion [ ]  Labs   CBC: Recent Labs  Lab 12/24/21 2133 12/26/21 0255 12/27/21 0341  WBC 15.0* 21.7* 22.7*  NEUTROABS 8.8*  --  18.8*  HGB 15.1 12.1* 11.7*  HCT 43.0 35.2* 34.8*  MCV 90.5 91.7 93.3  PLT 374 291 276    Basic Metabolic Panel: Recent Labs  Lab 12/24/21 2133 12/25/21 0421   12/26/21 0255 12/27/21 0341  NA 140  --  141 141  K 3.1*  --  3.4* 4.3  CL 106  --  105 108  CO2 20*  --  22 25  GLUCOSE 144*  --  87 152*  BUN 23  --  26* 18  CREATININE 1.81*  --  1.84* 1.60*  CALCIUM 9.5  --  8.7* 8.8*  MG  --  2.2  --   --    GFR: Estimated Creatinine Clearance: 52.6 mL/min (A) (by C-G formula  based on SCr of 1.6 mg/dL (H)). Recent Labs  Lab 12/24/21 2133 12/26/21 0255 12/27/21 0341  WBC 15.0* 21.7* 22.7*    Liver Function Tests: Recent Labs  Lab 12/24/21 2133 12/26/21 0255  AST 24 18  ALT 19 17  ALKPHOS 98 82  BILITOT 1.1 1.1  PROT 7.0 5.7*  ALBUMIN 3.7 2.9*   No results for input(s): "LIPASE", "AMYLASE" in the last 168 hours. No results for input(s): "AMMONIA" in the last 168 hours.  ABG    Component Value Date/Time   PHART 7.428 03/01/2010 0925   PCO2ART 36.0 03/01/2010 0925   PO2ART 55.0 (L) 03/01/2010 0925   HCO3 23.4 03/01/2010 0925   TCO2 20 01/05/2013 1245   ACIDBASEDEF 0.4 03/01/2010 0925   O2SAT 89.3 03/01/2010 0925     Coagulation Profile: No results for input(s): "INR", "PROTIME" in the last 168 hours.  Cardiac Enzymes: No results for input(s): "CKTOTAL", "CKMB", "CKMBINDEX", "TROPONINI" in the last 168 hours.  HbA1C: Hgb A1c MFr Bld  Date/Time Value Ref Range Status  01/28/2016 01:53 AM 5.6 4.8 - 5.6 % Final    Comment:    (NOTE)         Pre-diabetes: 5.7 - 6.4         Diabetes: >6.4         Glycemic control for adults with diabetes: <7.0   01/05/2013 12:45 PM 5.7 (H) <5.7 % Final    Comment:    (NOTE)                                                                       According to the ADA Clinical Practice Recommendations for 2011, when HbA1c is used as a screening test:  >=6.5%   Diagnostic of Diabetes Mellitus           (if abnormal result is confirmed) 5.7-6.4%   Increased risk of developing Diabetes Mellitus References:Diagnosis and Classification of Diabetes Mellitus,Diabetes Care,2011,34(Suppl 1):S62-S69 and Standards of Medical Care in         Diabetes - 2011,Diabetes Care,2011,34 (Suppl 1):S11-S61.    CBG: Recent Labs  Lab 12/24/21 2143  GLUCAP 148*    Deshante Cassell, MD, PhD 12/27/2021, 11:07 AM [facility] [phone] or if no answer before 7:00PM call [phone] For any issues  after 7:00PM please call eLink [phone]   

## 2021-12-27 NOTE — Evaluation (Addendum)
Physical Therapy Evaluation Patient Details Name: Brendan Butler MRN: [id number] DOB: [birth year] Today's Date: 12/27/2021  History of Present Illness  Pt is a 71 y.o. male who presented 12/24/21 with abdominal pain, intermittent AMS, and multiple episodes of syncope. Noted to be bradycardic, dizzy, and diaphoretic in triage. Pt admitted with symptomatic cholelithiasis. S/p laparoscopic cholecystectomy with LOA 10/31. Imaging also revealed concern for potential low-grade adenocarcinoma. PMH: CAD s/p stent (2014, had acute in-stent thrombosis with VF arrest requiring defib x 3 and then recurrent in-stent restenosis treated with cutting balloon angioplasty); HTN; HLD; chronic systolic CHF; and OSA not on CPAP   Clinical Impression  Pt presents with condition above and deficits mentioned below, see PT Problem List. PTA, he was IND without DME for mobility. He does endorse a short period ~2 weeks ago in which he spontaneously had weak legs and became lightheaded and fell ~15x and thus began to use his [facility], but then is sporadically improved and he began to go without an AD again. Pt lives alone in a 1-level house with 3 STE, but reports he has family and a landlord that can assist him 24/7 if needed. Pt reports having tremors chronically, which were noted today. His bil peripheral neuropathy in his lower extremities impact his balance and safety with mobility as he would intermittently slightly stagger while ambulating. However, he did not require assistance to recover his balance. Pt was able to mobilize at a min guard assist level progressing from using a RW initially to no UE support quickly today. Pt would benefit from further acute PT services and follow-up with OPPT to address his deficits in strength, balance, and activity tolerance to reduce his risk for falls. Recommend family/friend support at d/c.   Vitals -  136/62 (84) with HR 72 bpm in supine 140/88 with HR 82 bpm in sitting 181/101 with HR  81 bpm in standing 155/66 with HR 87 bpm while standing ~3 min  *denied any lightheadedness throughout session       Recommendations for follow up therapy are one component of a multi-disciplinary discharge planning process, led by the attending physician.  Recommendations may be updated based on patient status, additional functional criteria and insurance authorization.  Follow Up Recommendations Outpatient PT      Assistance Recommended at Discharge Intermittent Supervision/Assistance  Patient can return home with the following  Assistance with cooking/housework;Assist for transportation;Help with stairs or ramp for entrance    Equipment Recommendations Other (comment) (tub bench)  Recommendations for Other Services       Functional Status Assessment Patient has had a recent decline in their functional status and demonstrates the ability to make significant improvements in function in a reasonable and predictable amount of time.     Precautions / Restrictions Precautions Precautions: Fall Restrictions Weight Bearing Restrictions: No      Mobility  Bed Mobility Overal bed mobility: Needs Assistance Bed Mobility: Supine to Sit     Supine to sit: Min guard, HOB elevated     General bed mobility comments: Min guard for safety, HOB elevated    Transfers Overall transfer level: Needs assistance Equipment used: Rolling walker (2 wheels) Transfers: Sit to/from Stand Sit to Stand: Min guard           General transfer comment: Min guard for safety, no LOB    Ambulation/Gait Ambulation/Gait assistance: Min guard Gait Distance (Feet): 400 Feet Assistive device: Rolling walker (2 wheels), None Gait Pattern/deviations: Step-through pattern, Decreased stride length, Staggering left, Staggering  right Gait velocity: reduced Gait velocity interpretation: <1.8 ft/sec, indicate of risk for recurrent falls   General Gait Details: Pt initially ambulating with RW, but able  to quickly progress to no UE support. Pt with intermittent bouts of extra steps/slight staggering laterally but able to recover without need for assistance. Pt slows gait and demonstrates deviation to gait path when his vestibular system is challenges. No LOB in which pt required assistance though. Min guard for safety.  Stairs            Wheelchair Mobility    Modified Rankin (Stroke Patients Only)       Balance Overall balance assessment: Mild deficits observed, not formally tested                                           Pertinent Vitals/Pain Pain Assessment Pain Assessment: Faces Faces Pain Scale: No hurt Pain Intervention(s): Monitored during session    Home Living Family/patient expects to be discharged to:: Private residence Living Arrangements: Alone Available Help at Discharge: Family;Other (Comment);Available 24 hours/day (landlord) Type of Home: House Home Access: Stairs to enter Entrance Stairs-Rails: Can reach both Entrance Stairs-Number of Steps: 3   Home Layout: One level Home Equipment: Agricultural consultant (2 wheels);Rollator (4 wheels);Standard Walker;Cane - single point;Wheelchair - manual;Other (comment);Grab bars - tub/shower;Hand held shower head (suction cup grab bars)      Prior Function Prior Level of Function : Independent/Modified Independent;Driving;History of Falls (last six months)             Mobility Comments: > 15 falls in past 2 weeks when legs would give out or pt would get dizzy, no falls prior to that; Used a cane when the falls began, then falls just stopped randomly also and pt returned to no AD       Hand Dominance        Extremity/Trunk Assessment   Upper Extremity Assessment Upper Extremity Assessment: Generalized weakness (noted tremors throughout body, reports this is chronic)    Lower Extremity Assessment Lower Extremity Assessment: Generalized weakness (noted tremors throughout body, reports this  is chronic; hx of bil peripheral neuropathy)    Cervical / Trunk Assessment Cervical / Trunk Assessment: Normal  Communication   Communication: No difficulties  Cognition Arousal/Alertness: Awake/alert Behavior During Therapy: WFL for tasks assessed/performed Overall Cognitive Status: Within Functional Limits for tasks assessed                                 General Comments: A&Ox4 (except thought it was Nov 2nd). Seems aware of his deficits and how to maintain his safety        General Comments General comments (skin integrity, edema, etc.): Vitals - 136/62 (84) with HR 72 supine, 140/88 with HR 82 sitting, 181/101 with HR 81 standing, 155/66 with HR 87 standing ~3 min; denied any lightheadedness throughout session    Exercises     Assessment/Plan    PT Assessment Patient needs continued PT services  PT Problem List Decreased strength;Decreased activity tolerance;Decreased balance;Decreased mobility;Impaired sensation       PT Treatment Interventions DME instruction;Gait training;Stair training;Functional mobility training;Therapeutic exercise;Therapeutic activities;Balance training;Neuromuscular re-education;Patient/family education    PT Goals (Current goals can be found in the Care Plan section)  Acute Rehab PT Goals Patient Stated Goal: to get better PT Goal  Formulation: With patient Time For Goal Achievement: 01/10/22 Potential to Achieve Goals: Good    Frequency Min 3X/week     Co-evaluation               AM-PAC PT "6 Clicks" Mobility  Outcome Measure Help needed turning from your back to your side while in a flat bed without using bedrails?: None Help needed moving from lying on your back to sitting on the side of a flat bed without using bedrails?: A Little Help needed moving to and from a bed to a chair (including a wheelchair)?: A Little Help needed standing up from a chair using your arms (e.g., wheelchair or bedside chair)?: A  Little Help needed to walk in hospital room?: A Little Help needed climbing 3-5 steps with a railing? : A Little 6 Click Score: 19    End of Session Equipment Utilized During Treatment: Gait belt Activity Tolerance: Patient tolerated treatment well Patient left: in bed;with call bell/phone within reach;with bed alarm set   PT Visit Diagnosis: Unsteadiness on feet (R26.81);Other abnormalities of gait and mobility (R26.89);Muscle weakness (generalized) (M62.81);History of falling (Z91.81);Difficulty in walking, not elsewhere classified (R26.2);Repeated falls (R29.6)    Time: 0630-1601 PT Time Calculation (min) (ACUTE ONLY): 30 min   Charges:   PT Evaluation $PT Eval Moderate Complexity: 1 Mod PT Treatments $Gait Training: 8-22 mins        Moishe Spice, PT, DPT Acute Rehabilitation Services  Office: [phone]   Orvan Falconer 12/27/2021, 5:19 PM
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
## 2021-12-27 NOTE — H&P (View-Only) (Signed)
NAME:  Brendan Butler, MRN:  [id number], DOB:  1950/06/14, LOS: 1 ADMISSION DATE:  12/24/2021, CONSULTATION DATE:  10/31 REFERRING MD:  Doristine Bosworth [facility]), CHIEF COMPLAINT:  pulmonary nodule    History of Present Illness:  71yo male former smoker (2ppd x30 years quit 2001) with extensive cardiac hx including CAD s/p stent (2014 with VF arrest), HTN, systolic CHF, OSA, previously seen 12/2020 for known RLL pulmonary nodules but pt left AMA before further w/u could be arranged.  He returned 10/29 with abd pain and was found to have symptomatic cholelithiasis for which he underwent lap cholecystectomy 10/31. CT chest this admission concerning for increased size of previously known nodules.  Ground glass nodule has increased in size, cavitary nodule more difficult to determine if truly increased.  Previous admission last year pt also had c/o weight loss.  Pertinent  Medical History   has a past medical history of Acute myocardial infarction of other anterior wall, initial episode of care (12/29/12), Acute ST segment elevation MI ([place]) (01/05/13), Coronary artery disease (2001), Echocardiogram abnormal (01/06/13), Erectile dysfunction, Hyperlipidemia LDL goal <70, Hypertension, and OSA (obstructive sleep apnea).   Significant Hospital Events: Including procedures, antibiotic start and stop dates in addition to other pertinent events     Interim History / Subjective:   Patient comfortable, interacting appropriately.  Apparently had some agitation overnight last night. Very mild abdominal discomfort but improved. To have him scheduled for bronchoscopy 11/2.  Case management has been helping with regard to whether he should be discharged and have this done as an outpatient  Objective   Blood pressure (!) 146/64, pulse (!) 56, temperature 97.6 F (36.4 C), temperature source Oral, resp. rate 17, height _0  (1.778 m), weight 101 kg, SpO2 98 %.        Intake/Output Summary (Last 24 hours) at  12/27/2021 1107 Last data filed at 12/27/2021 0400 Gross per 24 hour  Intake 573.75 ml  Output 1400 ml  Net -826.25 ml   Filed Weights   12/25/21 1019 12/26/21 0823  Weight: 101.6 kg 101 kg    Examination: General: No distress, sleeping comfortably on his right side HENT: Oropharynx clear, strong voice, no secretions Lungs: Clear bilaterally Cardiovascular: Regular, no murmur Abdomen: Nondistended, lap sites look good, positive bowel sounds Extremities: No edema Neuro: Awake, appropriate, interacting.  Nonfocal   Assessment & Plan:  RLL Pulmonary nodules - cavitary nodule probably a bit bigger with a larger solid component.  The groundglass nodule is enlarging as well.  Has >60 pack years smoking hx, weight loss. Concern for malignancy although cannot r/o MAC, TB, other infectious etiology.  PLAN -  Patient scheduled for robotic navigational bronchoscopy on 11/2.  This can be done either as an inpatient or an outpatient as long as he agrees to come back.  I will make him n.p.o. at midnight assuming he will probably stay in inpatient   Best Practice (right click and "Reselect all SmartList Selections" daily)   Diet/type: NPO DVT prophylaxis: SCD GI prophylaxis: N/A Lines: N/A Foley:  N/A Code Status:  full code Last date of multidisciplinary goals of care discussion _1   Labs   CBC: Recent Labs  Lab 12/24/21 2133 12/26/21 0255 12/27/21 0341  WBC 15.0* 21.7* 22.7*  NEUTROABS 8.8*  --  18.8*  HGB 15.1 12.1* 11.7*  HCT 43.0 35.2* 34.8*  MCV 90.5 91.7 93.3  PLT 374 291 951    Basic Metabolic Panel: Recent Labs  Lab 12/24/21 2133 12/25/21 0421  12/26/21 0255 12/27/21 0341  NA 140  --  141 141  K 3.1*  --  3.4* 4.3  CL 106  --  105 108  CO2 20*  --  22 25  GLUCOSE 144*  --  87 152*  BUN 23  --  26* 18  CREATININE 1.81*  --  1.84* 1.60*  CALCIUM 9.5  --  8.7* 8.8*  MG  --  2.2  --   --    GFR: Estimated Creatinine Clearance: 52.6 mL/min (A) (by C-G formula  based on SCr of 1.6 mg/dL (H)). Recent Labs  Lab 12/24/21 2133 12/26/21 0255 12/27/21 0341  WBC 15.0* 21.7* 22.7*    Liver Function Tests: Recent Labs  Lab 12/24/21 2133 12/26/21 0255  AST 24 18  ALT 19 17  ALKPHOS 98 82  BILITOT 1.1 1.1  PROT 7.0 5.7*  ALBUMIN 3.7 2.9*   No results for input(s): "LIPASE", "AMYLASE" in the last 168 hours. No results for input(s): "AMMONIA" in the last 168 hours.  ABG    Component Value Date/Time   PHART 7.428 03/01/2010 0925   PCO2ART 36.0 03/01/2010 0925   PO2ART 55.0 (L) 03/01/2010 0925   HCO3 23.4 03/01/2010 0925   TCO2 20 01/05/2013 1245   ACIDBASEDEF 0.4 03/01/2010 0925   O2SAT 89.3 03/01/2010 0925     Coagulation Profile: No results for input(s): "INR", "PROTIME" in the last 168 hours.  Cardiac Enzymes: No results for input(s): "CKTOTAL", "CKMB", "CKMBINDEX", "TROPONINI" in the last 168 hours.  HbA1C: Hgb A1c MFr Bld  Date/Time Value Ref Range Status  01/28/2016 01:53 AM 5.6 4.8 - 5.6 % Final    Comment:    (NOTE)         Pre-diabetes: 5.7 - 6.4         Diabetes: >6.4         Glycemic control for adults with diabetes: <7.0   01/05/2013 12:45 PM 5.7 (H) <5.7 % Final    Comment:    (NOTE)                                                                       According to the ADA Clinical Practice Recommendations for 2011, when HbA1c is used as a screening test:  >=6.5%   Diagnostic of Diabetes Mellitus           (if abnormal result is confirmed) 5.7-6.4%   Increased risk of developing Diabetes Mellitus References:Diagnosis and Classification of Diabetes Mellitus,Diabetes TDHR,4163,84(TXMIW 1):S62-S69 and Standards of Medical Care in         Diabetes - 2011,Diabetes OEHO,1224,82 (Suppl 1):S11-S61.    CBG: Recent Labs  Lab 12/24/21 2143  GLUCAP 148*    Baltazar Apo, MD, PhD 12/27/2021, 11:07 AM [facility] [phone] or if no answer before 7:00PM call [phone] For any issues  after 7:00PM please call eLink [phone]

## 2021-12-27 NOTE — Progress Notes (Signed)
Mobility Specialist Progress Note   12/27/21 0920  Mobility  Activity Ambulated with assistance in hallway;Ambulated with assistance to bathroom  Level of Assistance Standby assist, set-up cues, supervision of patient - no hands on  Assistive Device Front wheel walker  Distance Ambulated (ft) 240 ft  Range of Motion/Exercises Active;All extremities  Activity Response Tolerated well   Patient received in recliner eager to participate. Stood independently and requested rolling walker due to LE's feeling weak. Ambulated to bathroom and then hallway at supervision level with steady gait. Patient has a narrow base which resulted in a larger decrease in step length. Limited by fatigue and generalized weakness. Returned to room without complaint or incident. Was left in recliner with all needs met, call bell in reach.   [place] Amiel Sharrow, [place], [place]  Office: [phone]

## 2021-12-27 NOTE — Plan of Care (Signed)

## 2021-12-27 NOTE — Progress Notes (Signed)
Subjective:  No cardiac symptoms Discussed pulmonary issues   Objective:  Vitals:   12/26/21 2000 12/26/21 2010 12/27/21 0350 12/27/21 0740  BP: (!) 141/58  (!) 120/52 (!) 146/64  Pulse: 78  60 (!) 56  Resp: 20   17  Temp: (!) 100.5 F (38.1 C) 98.3 F (36.8 C) 98.4 F (36.9 C) 97.6 F (36.4 C)  TempSrc: Oral Oral Oral Oral  SpO2: 97%  95% 98%  Weight:      Height:        Intake/Output from previous day:  Intake/Output Summary (Last 24 hours) at 12/27/2021 0914 Last data filed at 12/27/2021 0400 Gross per 24 hour  Intake 1473.07 ml  Output 1420 ml  Net 53.07 ml    Physical Exam: Affect appropriate Healthy:  appears stated age HEENT: normal Neck supple with no adenopathy JVP normal no bruits no thyromegaly Lungs rhonchi exp wheeze Heart:  S1/S2 no murmur, no rub, gallop or click PMI normal Abdomen: post lap choly  Distal pulses intact with no bruits No edema Neuro non-focal Skin warm and dry No muscular weakness   Lab Results: Basic Metabolic Panel: Recent Labs    12/25/21 0421 12/26/21 0255 12/27/21 0341  NA  --  141 141  K  --  3.4* 4.3  CL  --  105 108  CO2  --  22 25  GLUCOSE  --  87 152*  BUN  --  26* 18  CREATININE  --  1.84* 1.60*  CALCIUM  --  8.7* 8.8*  MG 2.2  --   --    Liver Function Tests: Recent Labs    12/24/21 2133 12/26/21 0255  AST 24 18  ALT 19 17  ALKPHOS 98 82  BILITOT 1.1 1.1  PROT 7.0 5.7*  ALBUMIN 3.7 2.9*   No results for input(s): "LIPASE", "AMYLASE" in the last 72 hours. CBC: Recent Labs    12/24/21 2133 12/26/21 0255 12/27/21 0341  WBC 15.0* 21.7* 22.7*  NEUTROABS 8.8*  --  18.8*  HGB 15.1 12.1* 11.7*  HCT 43.0 35.2* 34.8*  MCV 90.5 91.7 93.3  PLT 374 291 276     Imaging: ECHOCARDIOGRAM COMPLETE  Result Date: 12/26/2021    ECHOCARDIOGRAM REPORT   Patient Name:   AMR STURTEVANT Date of Exam: 12/26/2021 Medical Rec #:  [id number]        Height:       70.0 in Accession #:    [id number]        Weight:       222.7 lb Date of Birth:  31-May-1950       BSA:          2.185 m Patient Age:    71 years         BP:           159/70 mmHg Patient Gender: M                HR:           84 bpm. Exam Location:  Inpatient Procedure: 2D Echo, Color Doppler and Cardiac Doppler Indications:    CHF - acute systolic  History:        Patient has prior history of Echocardiogram examinations, most                 recent 03/09/2013. CHF, CAD and Previous Myocardial Infarction,  Arrythmias:Bradycardia; Risk Factors:Hypertension, Dyslipidemia                 and Sleep Apnea. CKD.  Sonographer:    Eartha Inch Referring Phys: [place]  Sonographer Comments: Technically difficult study due to poor echo windows. Image acquisition challenging due to patient body habitus and Image acquisition challenging due to respiratory motion. IMPRESSIONS  1. Left ventricular ejection fraction, by estimation, is 55 to 60%. The left ventricle has normal function. The left ventricle demonstrates regional wall motion abnormalities (suggestive of prior anteroseptal/LAD infarct). The left ventricular internal cavity size was mildly dilated. Left ventricular diastolic parameters are consistent with Grade I diastolic dysfunction (impaired relaxation).  2. Right ventricular systolic function is normal. The right ventricular size is normal. Tricuspid regurgitation signal is inadequate for assessing PA pressure.  3. The mitral valve is grossly normal. No evidence of mitral valve regurgitation. No evidence of mitral stenosis.  4. The aortic valve is tricuspid. Aortic valve regurgitation is not visualized. No aortic stenosis is present. Comparison(s): LVEF has improve from prior reporting. FINDINGS  Left Ventricle: Left ventricular ejection fraction, by estimation, is 55 to 60%. The left ventricle has normal function. The left ventricle demonstrates regional wall motion abnormalities. The left ventricular internal cavity size was  mildly dilated. There is no left ventricular hypertrophy. Left ventricular diastolic parameters are consistent with Grade I diastolic dysfunction (impaired relaxation).  LV Wall Scoring: The entire anterior septum is hypokinetic. Right Ventricle: The right ventricular size is normal. No increase in right ventricular wall thickness. Right ventricular systolic function is normal. Tricuspid regurgitation signal is inadequate for assessing PA pressure. Left Atrium: Left atrial size was normal in size. Right Atrium: Right atrial size was normal in size. Pericardium: Trivial pericardial effusion is present. Presence of epicardial fat layer. Mitral Valve: The mitral valve is grossly normal. No evidence of mitral valve regurgitation. No evidence of mitral valve stenosis. Tricuspid Valve: The tricuspid valve is not well visualized. Tricuspid valve regurgitation is not demonstrated. No evidence of tricuspid stenosis. Aortic Valve: The aortic valve is tricuspid. Aortic valve regurgitation is not visualized. No aortic stenosis is present. Pulmonic Valve: The pulmonic valve was not well visualized. Pulmonic valve regurgitation is not visualized. No evidence of pulmonic stenosis. Aorta: The aortic root, ascending aorta and aortic arch are all structurally normal, with no evidence of dilitation or obstruction. IAS/Shunts: No atrial level shunt detected by color flow Doppler.  LEFT VENTRICLE PLAX 2D LVIDd:         5.80 cm      Diastology LVIDs:         4.00 cm      LV e' medial:    5.66 cm/s LV PW:         0.90 cm      LV E/e' medial:  11.0 LV IVS:        0.90 cm      LV e' lateral:   8.70 cm/s LVOT diam:     2.10 cm      LV E/e' lateral: 7.2 LV SV:         85 LV SV Index:   39 LVOT Area:     3.46 cm  LV Volumes (MOD) LV vol d, MOD A2C: 132.0 ml LV vol d, MOD A4C: 131.0 ml LV vol s, MOD A2C: 60.7 ml LV vol s, MOD A4C: 54.5 ml LV SV MOD A2C:     71.3 ml LV SV MOD A4C:  131.0 ml LV SV MOD BP:      74.0 ml RIGHT VENTRICLE RV S  prime:     18.20 cm/s TAPSE (M-mode): 2.3 cm LEFT ATRIUM             Index        RIGHT ATRIUM           Index LA diam:        3.80 cm 1.74 cm/m   RA Area:     12.90 cm LA Vol (A2C):   34.2 ml 15.65 ml/m  RA Volume:   27.80 ml  12.72 ml/m LA Vol (A4C):   48.2 ml 22.06 ml/m LA Biplane Vol: 44.0 ml 20.14 ml/m  AORTIC VALVE LVOT Vmax:   127.00 cm/s LVOT Vmean:  95.400 cm/s LVOT VTI:    0.244 m  AORTA Ao Root diam: 2.80 cm Ao Asc diam:  3.90 cm MITRAL VALVE MV Area (PHT): 2.26 cm    SHUNTS MV Decel Time: 336 msec    Systemic VTI:  0.24 m MV E velocity: 62.50 cm/s  Systemic Diam: 2.10 cm Riley Lam MD Electronically signed by Riley Lam MD Signature Date/Time: 12/26/2021/4:26:57 PM    Final    CT CHEST WO CONTRAST  Result Date: 12/25/2021 CLINICAL DATA:  Pulmonary nodule EXAM: CT CHEST WITHOUT CONTRAST TECHNIQUE: Multidetector CT imaging of the chest was performed following the standard protocol without IV contrast. RADIATION DOSE REDUCTION: This exam was performed according to the departmental dose-optimization program which includes automated exposure control, adjustment of the mA and/or kV according to patient size and/or use of iterative reconstruction technique. COMPARISON:  01/16/2021 FINDINGS: Cardiovascular: Coronary, aortic arch, and branch vessel atherosclerotic vascular disease. Mediastinum/Nodes: Unremarkable Lungs/Pleura: Airway thickening is present, suggesting bronchitis or reactive airways disease. Mixed density partially cavitary nodule posteriorly in the right lower lobe on image 100 series 4 measuring 2.3 by 1.4 cm, previously 2.3 by 1.2 cm and previously with less dense/solid components. Ground-glass density nodule in the posterior basal segment right lower lobe 1.2 by 0.9 cm on image 128 series 4, formerly 1.0 by 0.7 cm. Dependent reticulation and architectural irregularity in the posterior basal segment and superior segment right lower lobe. Faint ground-glass opacity  along the margin of a small bulla or air cyst in the right upper lobe on image 52 series 4, stable with ground-glass component measuring 1.5 by 0.9 cm on image 54 series 4. Upper Abdomen: 2.2 cm gallstone in the gallbladder. Abdominal aortic atherosclerosis. Postoperative findings from prior left nephrectomy. Musculoskeletal: Since 01/16/2021 there has been an interval 25% superior endplate compression fracture the L1 vertebral level without significant bony retropulsion or posterior element involvement. This is likely subacute. Mild paraspinal edema example on image 167 series 3. IMPRESSION: 1. The mixed density partially cavitary nodule posteriorly in the right lower lobe has mildly increased in size, currently 2.3 by 1.4 cm and with some increase in solidity. Suspicious for potential low-grade adenocarcinoma. 2. Ground-glass density nodule in the posterior basal segment right lower lobe has mildly increased in size, currently 1.2 by 0.9 cm, previously 1.0 by 0.7 cm. Also possible low-grade adenocarcinoma. 3. Airway thickening is present, suggesting bronchitis or reactive airways disease. 4. 25% superior endplate compression fracture the L1 vertebral level, likely subacute, with mild paraspinal edema. 5. Coronary, aortic arch, and branch vessel atherosclerotic vascular disease. 6. Cholelithiasis. 7. Postoperative findings from prior left nephrectomy. 8. Aortic atherosclerosis. 9. Dependent reticulation and architectural irregularity in the posterior basal segment and superior segment  right lower lobe, probably from scarring. 10. Faint ground-glass opacity along the margin of a small bulla or air cyst in the right upper lobe measuring 1.5 by 0.9 cm, stable. Aortic Atherosclerosis (ICD10-I70.0). Electronically Signed   By: Gaylyn Rong M.D.   On: 12/25/2021 20:03    Cardiac Studies:  ECG: SR/Sinus arrhythmia non specific ST changes    Telemetry:  Echo: pending  Medications:    buPROPion  450 mg Oral  Daily   fluticasone  1 spray Each Nare Daily   PARoxetine  30 mg Oral Daily   pregabalin  450 mg Oral Daily   rosuvastatin  10 mg Oral Daily      lactated ringers 75 mL/hr at 12/25/21 2338   piperacillin-tazobactam (ZOSYN)  IV 3.375 g (12/27/21 0619)    Assessment/Plan:  Brendan Butler is a 71 y.o. male with a hx of STEMI 2014-with PCI to LAD, repeat STEMI 1 week later presented with STEMI + Vfib with cath, new in stent thrombosis, HLD, HTN , lymphocytosis for 14 years   who is being seen 12/25/2021 for the evaluation of pre-op and bradycardia at the request of Dr. Demaris Callander. Sheliah Hatch.  Post Operative : no complications WBC still up surgical incisions ok  Bradycardia: no AV block  GB:   continue Zosyn  Pulmonary ? ILD and adenocarcinoma Quit smoking long time ago but 50 pack year history and environmental exposures from welding Dr Delton Coombes has seen and plans biopsy of cavitary nodules Continue to hold Effient until after biopsy HLD:  continue statin   Charlton Haws 12/27/2021, 9:14 AM

## 2021-12-27 NOTE — Progress Notes (Addendum)
Progress Note  1 Day Post-Op  Subjective: Sitting up in bed having just finished breakfast. No nausea or emesis. Pain well controlled. Has not ambulated yet  Friend is bedside  Objective: Vital signs in last 24 hours: Temp:  [97.6 F (36.4 C)-100.5 F (38.1 C)] 97.6 F (36.4 C) (11/01 0740) Pulse Rate:  [56-98] 56 (11/01 0740) Resp:  [10-20] 17 (11/01 0740) BP: (120-179)/(52-84) 146/64 (11/01 0740) SpO2:  [93 %-100 %] 98 % (11/01 0740) Weight:  [101 kg] 101 kg (10/31 0823) Last BM Date : 12/24/21  Intake/Output from previous day: 10/31 0701 - 11/01 0700 In: 1473.1 [P.O.:450; I.V.:600; IV Piggyback:423.1] Out: 1820 [Urine:1800; Blood:20] Intake/Output this shift: No intake/output data recorded.  PE: General: pleasant, WD, male who is laying in bed in NAD Lungs:  Respiratory effort nonlabored on room air Abd: soft, ND, +BS, minimally tender to palpation around incisions which are c/d/I with surgical glue MSK: all 4 extremities are symmetrical with no cyanosis, clubbing, or edema. Skin: warm and dry Psych: A&Ox3 with an appropriate affect.    Lab Results:  Recent Labs    12/26/21 0255 12/27/21 0341  WBC 21.7* 22.7*  HGB 12.1* 11.7*  HCT 35.2* 34.8*  PLT 291 276   BMET Recent Labs    12/26/21 0255 12/27/21 0341  NA 141 141  K 3.4* 4.3  CL 105 108  CO2 22 25  GLUCOSE 87 152*  BUN 26* 18  CREATININE 1.84* 1.60*  CALCIUM 8.7* 8.8*   PT/INR No results for input(s): "LABPROT", "INR" in the last 72 hours. CMP     Component Value Date/Time   NA 141 12/27/2021 0341   NA 139 11/07/2016 1155   K 4.3 12/27/2021 0341   K 4.0 11/07/2016 1155   CL 108 12/27/2021 0341   CO2 25 12/27/2021 0341   CO2 25 11/07/2016 1155   GLUCOSE 152 (H) 12/27/2021 0341   GLUCOSE 103 11/07/2016 1155   BUN 18 12/27/2021 0341   BUN 12.6 11/07/2016 1155   CREATININE 1.60 (H) 12/27/2021 0341   CREATININE 1.2 11/07/2016 1155   CALCIUM 8.8 (L) 12/27/2021 0341   CALCIUM 9.1  11/07/2016 1155   PROT 5.7 (L) 12/26/2021 0255   PROT 7.0 11/07/2016 1155   ALBUMIN 2.9 (L) 12/26/2021 0255   ALBUMIN 3.7 11/07/2016 1155   AST 18 12/26/2021 0255   AST 30 11/07/2016 1155   ALT 17 12/26/2021 0255   ALT 49 11/07/2016 1155   ALKPHOS 82 12/26/2021 0255   ALKPHOS 84 11/07/2016 1155   BILITOT 1.1 12/26/2021 0255   BILITOT 0.70 11/07/2016 1155   GFRNONAA 47 (L) 12/27/2021 0341   GFRAA >60 06/20/2019 1955   Lipase     Component Value Date/Time   LIPASE 43 01/16/2021 1208       Studies/Results: ECHOCARDIOGRAM COMPLETE  Result Date: 12/26/2021    ECHOCARDIOGRAM REPORT   Patient Name:   Brendan Butler Date of Exam: 12/26/2021 Medical Rec #:  [id number]        Height:       70.0 in Accession #:    [id number]       Weight:       222.7 lb Date of Birth:  1950/08/11       BSA:          2.185 m Patient Age:    39 years         BP:           159/70 mmHg Patient  Gender: M                HR:           84 bpm. Exam Location:  Inpatient Procedure: 2D Echo, Color Doppler and Cardiac Doppler Indications:    CHF - acute systolic  History:        Patient has prior history of Echocardiogram examinations, most                 recent 03/09/2013. CHF, CAD and Previous Myocardial Infarction,                 Arrythmias:Bradycardia; Risk Factors:Hypertension, Dyslipidemia                 and Sleep Apnea. CKD.  Sonographer:    Milda Smart Referring Phys: [id number] JENNIFER YATES  Sonographer Comments: Technically difficult study due to poor echo windows. Image acquisition challenging due to patient body habitus and Image acquisition challenging due to respiratory motion. IMPRESSIONS  1. Left ventricular ejection fraction, by estimation, is 55 to 60%. The left ventricle has normal function. The left ventricle demonstrates regional wall motion abnormalities (suggestive of prior anteroseptal/LAD infarct). The left ventricular internal cavity size was mildly dilated. Left ventricular diastolic parameters  are consistent with Grade I diastolic dysfunction (impaired relaxation).  2. Right ventricular systolic function is normal. The right ventricular size is normal. Tricuspid regurgitation signal is inadequate for assessing PA pressure.  3. The mitral valve is grossly normal. No evidence of mitral valve regurgitation. No evidence of mitral stenosis.  4. The aortic valve is tricuspid. Aortic valve regurgitation is not visualized. No aortic stenosis is present. Comparison(s): LVEF has improve from prior reporting. FINDINGS  Left Ventricle: Left ventricular ejection fraction, by estimation, is 55 to 60%. The left ventricle has normal function. The left ventricle demonstrates regional wall motion abnormalities. The left ventricular internal cavity size was mildly dilated. There is no left ventricular hypertrophy. Left ventricular diastolic parameters are consistent with Grade I diastolic dysfunction (impaired relaxation).  LV Wall Scoring: The entire anterior septum is hypokinetic. Right Ventricle: The right ventricular size is normal. No increase in right ventricular wall thickness. Right ventricular systolic function is normal. Tricuspid regurgitation signal is inadequate for assessing PA pressure. Left Atrium: Left atrial size was normal in size. Right Atrium: Right atrial size was normal in size. Pericardium: Trivial pericardial effusion is present. Presence of epicardial fat layer. Mitral Valve: The mitral valve is grossly normal. No evidence of mitral valve regurgitation. No evidence of mitral valve stenosis. Tricuspid Valve: The tricuspid valve is not well visualized. Tricuspid valve regurgitation is not demonstrated. No evidence of tricuspid stenosis. Aortic Valve: The aortic valve is tricuspid. Aortic valve regurgitation is not visualized. No aortic stenosis is present. Pulmonic Valve: The pulmonic valve was not well visualized. Pulmonic valve regurgitation is not visualized. No evidence of pulmonic stenosis.  Aorta: The aortic root, ascending aorta and aortic arch are all structurally normal, with no evidence of dilitation or obstruction. IAS/Shunts: No atrial level shunt detected by color flow Doppler.  LEFT VENTRICLE PLAX 2D LVIDd:         5.80 cm      Diastology LVIDs:         4.00 cm      LV e' medial:    5.66 cm/s LV PW:         0.90 cm      LV E/e' medial:  11.0 LV IVS:  0.90 cm      LV e' lateral:   8.70 cm/s LVOT diam:     2.10 cm      LV E/e' lateral: 7.2 LV SV:         85 LV SV Index:   39 LVOT Area:     3.46 cm  LV Volumes (MOD) LV vol d, MOD A2C: 132.0 ml LV vol d, MOD A4C: 131.0 ml LV vol s, MOD A2C: 60.7 ml LV vol s, MOD A4C: 54.5 ml LV SV MOD A2C:     71.3 ml LV SV MOD A4C:     131.0 ml LV SV MOD BP:      74.0 ml RIGHT VENTRICLE RV S prime:     18.20 cm/s TAPSE (M-mode): 2.3 cm LEFT ATRIUM             Index        RIGHT ATRIUM           Index LA diam:        3.80 cm 1.74 cm/m   RA Area:     12.90 cm LA Vol (A2C):   34.2 ml 15.65 ml/m  RA Volume:   27.80 ml  12.72 ml/m LA Vol (A4C):   48.2 ml 22.06 ml/m LA Biplane Vol: 44.0 ml 20.14 ml/m  AORTIC VALVE LVOT Vmax:   127.00 cm/s LVOT Vmean:  95.400 cm/s LVOT VTI:    0.244 m  AORTA Ao Root diam: 2.80 cm Ao Asc diam:  3.90 cm MITRAL VALVE MV Area (PHT): 2.26 cm    SHUNTS MV Decel Time: 336 msec    Systemic VTI:  0.24 m MV E velocity: 62.50 cm/s  Systemic Diam: 2.10 cm Riley Lam MD Electronically signed by Riley Lam MD Signature Date/Time: 12/26/2021/4:26:57 PM    Final    CT CHEST WO CONTRAST  Result Date: 12/25/2021 CLINICAL DATA:  Pulmonary nodule EXAM: CT CHEST WITHOUT CONTRAST TECHNIQUE: Multidetector CT imaging of the chest was performed following the standard protocol without IV contrast. RADIATION DOSE REDUCTION: This exam was performed according to the departmental dose-optimization program which includes automated exposure control, adjustment of the mA and/or kV according to patient size and/or use of iterative  reconstruction technique. COMPARISON:  01/16/2021 FINDINGS: Cardiovascular: Coronary, aortic arch, and branch vessel atherosclerotic vascular disease. Mediastinum/Nodes: Unremarkable Lungs/Pleura: Airway thickening is present, suggesting bronchitis or reactive airways disease. Mixed density partially cavitary nodule posteriorly in the right lower lobe on image 100 series 4 measuring 2.3 by 1.4 cm, previously 2.3 by 1.2 cm and previously with less dense/solid components. Ground-glass density nodule in the posterior basal segment right lower lobe 1.2 by 0.9 cm on image 128 series 4, formerly 1.0 by 0.7 cm. Dependent reticulation and architectural irregularity in the posterior basal segment and superior segment right lower lobe. Faint ground-glass opacity along the margin of a small bulla or air cyst in the right upper lobe on image 52 series 4, stable with ground-glass component measuring 1.5 by 0.9 cm on image 54 series 4. Upper Abdomen: 2.2 cm gallstone in the gallbladder. Abdominal aortic atherosclerosis. Postoperative findings from prior left nephrectomy. Musculoskeletal: Since 01/16/2021 there has been an interval 25% superior endplate compression fracture the L1 vertebral level without significant bony retropulsion or posterior element involvement. This is likely subacute. Mild paraspinal edema example on image 167 series 3. IMPRESSION: 1. The mixed density partially cavitary nodule posteriorly in the right lower lobe has mildly increased in size, currently 2.3 by 1.4 cm  and with some increase in solidity. Suspicious for potential low-grade adenocarcinoma. 2. Ground-glass density nodule in the posterior basal segment right lower lobe has mildly increased in size, currently 1.2 by 0.9 cm, previously 1.0 by 0.7 cm. Also possible low-grade adenocarcinoma. 3. Airway thickening is present, suggesting bronchitis or reactive airways disease. 4. 25% superior endplate compression fracture the L1 vertebral level, likely  subacute, with mild paraspinal edema. 5. Coronary, aortic arch, and branch vessel atherosclerotic vascular disease. 6. Cholelithiasis. 7. Postoperative findings from prior left nephrectomy. 8. Aortic atherosclerosis. 9. Dependent reticulation and architectural irregularity in the posterior basal segment and superior segment right lower lobe, probably from scarring. 10. Faint ground-glass opacity along the margin of a small bulla or air cyst in the right upper lobe measuring 1.5 by 0.9 cm, stable. Aortic Atherosclerosis (ICD10-I70.0). Electronically Signed   By: Gaylyn Rong M.D.   On: 12/25/2021 20:03    Anti-infectives: Anti-infectives (From admission, onward)    Start     Dose/Rate Route Frequency Ordered Stop   12/25/21 1045  piperacillin-tazobactam (ZOSYN) IVPB 3.375 g        3.375 g 12.5 mL/hr over 240 Minutes Intravenous Every 8 hours 12/25/21 1029          Assessment/Plan  Acute cholecystitis POD1 s/p laparoscopic cholecystectomy with LOA 10/31 Dr. Janee Morn  - cardiac clearance obtained preoperatively - hgb 11.7 from 12.1, can resume effient from surgical standpoint but defer to primary given possible bx - given significant leukocytosis continue IV abx 24 hours post op  FEN: heart healthy ID: [email or web address] VTE: scds. Okay for chemical ppx from surgical standpoint  Enlarging pulmonary nodules - pulm planning bx 11/2    LOS: 1 day   Eric Form, [facility] 12/27/2021, 8:08 AM Please see Amion for pager number during day hours 7:00am-4:30pm

## 2021-12-27 NOTE — Progress Notes (Signed)
Pharmacy Antibiotic Note  Brendan Butler is a 71 y.o. male admitted on 12/24/2021 with abdominal pain and suspected cholecystitis. Pharmacy has been consulted for Zosyn dosing.  Plan: Continue Zosyn through the end of the day today (11/1). Cleared with surgery and pulmonology.  Height: 5\' 10"  (177.8 cm) Weight: 101 kg (222 lb 10.6 oz) IBW/kg (Calculated) : 73  Temp (24hrs), Avg:99.1 F (37.3 C), Min:97.6 F (36.4 C), Max:100.5 F (38.1 C)  Recent Labs  Lab 12/24/21 2133 12/26/21 0255 12/27/21 0341  WBC 15.0* 21.7* 22.7*  CREATININE 1.81* 1.84* 1.60*     Estimated Creatinine Clearance: 52.6 mL/min (A) (by C-G formula based on SCr of 1.6 mg/dL (H)).    Allergies  Allergen Reactions   Levitra [Vardenafil] Other (See Comments)    Blurred vision   Aspirin Hives   Clopidogrel Other (See Comments)    Not effective anti platelet med for patient-switched to effient   Lipitor [Atorvastatin] Other (See Comments)    Muscle cramps; patient states he does not take medication at home    Antimicrobials this admission: Zosyn 10/30 >> 11/1  Thank you for allowing pharmacy to be a part of this patient's care.  Erskine Speed, PharmD Clinical Pharmacist 12/27/2021 12:19 PM

## 2021-12-28 ENCOUNTER — Inpatient Hospital Stay (HOSPITAL_COMMUNITY): Payer: Medicare HMO | Admitting: Anesthesiology

## 2021-12-28 ENCOUNTER — Encounter (HOSPITAL_COMMUNITY): Payer: Self-pay | Admitting: Internal Medicine

## 2021-12-28 ENCOUNTER — Telehealth: Payer: Self-pay | Admitting: Emergency Medicine

## 2021-12-28 ENCOUNTER — Inpatient Hospital Stay (HOSPITAL_COMMUNITY): Payer: Medicare HMO

## 2021-12-28 ENCOUNTER — Encounter (HOSPITAL_COMMUNITY)
Admission: EM | Disposition: A | Discharge status: Home / Self Care | Payer: Self-pay | Source: Home / Self Care | Attending: Internal Medicine

## 2021-12-28 DIAGNOSIS — R918 Other nonspecific abnormal finding of lung field: Secondary | ICD-10-CM

## 2021-12-28 DIAGNOSIS — I10 Essential (primary) hypertension: Secondary | ICD-10-CM

## 2021-12-28 DIAGNOSIS — Z87891 Personal history of nicotine dependence: Secondary | ICD-10-CM

## 2021-12-28 DIAGNOSIS — I2510 Atherosclerotic heart disease of native coronary artery without angina pectoris: Secondary | ICD-10-CM

## 2021-12-28 DIAGNOSIS — I252 Old myocardial infarction: Secondary | ICD-10-CM

## 2021-12-28 HISTORY — PX: BRONCHIAL NEEDLE ASPIRATION BIOPSY: SHX5106

## 2021-12-28 HISTORY — PX: BRONCHIAL BRUSHINGS: SHX5108

## 2021-12-28 HISTORY — PX: FIDUCIAL MARKER PLACEMENT: SHX6858

## 2021-12-28 HISTORY — PX: BRONCHIAL BIOPSY: SHX5109

## 2021-12-28 HISTORY — PX: BRONCHIAL WASHINGS: SHX5105

## 2021-12-28 LAB — CBC
HCT: 33.5 % — ABNORMAL LOW (ref 39.0–52.0)
Hemoglobin: 11.5 g/dL — ABNORMAL LOW (ref 13.0–17.0)
MCH: 31.9 pg (ref 26.0–34.0)
MCHC: 34.3 g/dL (ref 30.0–36.0)
MCV: 92.8 fL (ref 80.0–100.0)
Platelets: 269 10*3/uL (ref 150–400)
RBC: 3.61 MIL/uL — ABNORMAL LOW (ref 4.22–5.81)
RDW: 12.5 % (ref 11.5–15.5)
WBC: 23.6 10*3/uL — ABNORMAL HIGH (ref 4.0–10.5)
nRBC: 0 % (ref 0.0–0.2)

## 2021-12-28 LAB — PROTIME-INR
INR: 1.2 (ref 0.8–1.2)
Prothrombin Time: 14.8 seconds (ref 11.4–15.2)

## 2021-12-28 LAB — SARS CORONAVIRUS 2 BY RT PCR: SARS Coronavirus 2 by RT PCR: NEGATIVE

## 2021-12-28 SURGERY — BRONCHOSCOPY, WITH BIOPSY USING ELECTROMAGNETIC NAVIGATION
Anesthesia: General | Laterality: Right

## 2021-12-28 MED ORDER — PROPOFOL 500 MG/50ML IV EMUL
INTRAVENOUS | Status: DC | PRN
  Administered 2021-12-28: 125 ug/kg/min via INTRAVENOUS

## 2021-12-28 MED ORDER — ROCURONIUM BROMIDE 10 MG/ML (PF) SYRINGE
PREFILLED_SYRINGE | INTRAVENOUS | Status: DC | PRN
  Administered 2021-12-28: 20 mg via INTRAVENOUS
  Administered 2021-12-28: 40 mg via INTRAVENOUS

## 2021-12-28 MED ORDER — LIDOCAINE 2% (20 MG/ML) 5 ML SYRINGE
INTRAMUSCULAR | Status: DC | PRN
  Administered 2021-12-28: 60 mg via INTRAVENOUS

## 2021-12-28 MED ORDER — AMOXICILLIN-POT CLAVULANATE 875-125 MG PO TABS: 1.0000 | ORAL_TABLET | Freq: Two times a day (BID) | ORAL | 0 refills | Status: AC

## 2021-12-28 MED ORDER — PHENYLEPHRINE HCL-NACL 20-0.9 MG/250ML-% IV SOLN
INTRAVENOUS | Status: DC | PRN
  Administered 2021-12-28: 25 ug/min via INTRAVENOUS

## 2021-12-28 MED ORDER — ONDANSETRON HCL 4 MG/2ML IJ SOLN
INTRAMUSCULAR | Status: DC | PRN
  Administered 2021-12-28: 4 mg via INTRAVENOUS

## 2021-12-28 MED ORDER — SUGAMMADEX SODIUM 200 MG/2ML IV SOLN
INTRAVENOUS | Status: DC | PRN
  Administered 2021-12-28: 200 mg via INTRAVENOUS

## 2021-12-28 MED ORDER — AMOXICILLIN-POT CLAVULANATE 875-125 MG PO TABS
1.0000 | ORAL_TABLET | Freq: Two times a day (BID) | ORAL | Status: DC
  Administered 2021-12-28: 1 via ORAL
  Filled 2021-12-28: qty 1

## 2021-12-28 MED ORDER — PROPOFOL 10 MG/ML IV BOLUS
INTRAVENOUS | Status: DC | PRN
  Administered 2021-12-28: 20 mg via INTRAVENOUS
  Administered 2021-12-28: 150 mg via INTRAVENOUS

## 2021-12-28 MED ORDER — FENTANYL CITRATE (PF) 250 MCG/5ML IJ SOLN
INTRAMUSCULAR | Status: DC | PRN
  Administered 2021-12-28 (×2): 50 ug via INTRAVENOUS

## 2021-12-28 MED ORDER — FENTANYL CITRATE (PF) 100 MCG/2ML IJ SOLN
INTRAMUSCULAR | Status: AC
  Filled 2021-12-28: qty 2

## 2021-12-28 MED ORDER — DEXAMETHASONE SODIUM PHOSPHATE 10 MG/ML IJ SOLN
INTRAMUSCULAR | Status: DC | PRN
  Administered 2021-12-28: 10 mg via INTRAVENOUS

## 2021-12-28 MED ORDER — PRASUGREL HCL 10 MG PO TABS: 10.0000 mg | ORAL_TABLET | Freq: Every day | ORAL | 11 refills | Status: DC

## 2021-12-28 MED ORDER — HYDROCODONE-ACETAMINOPHEN 5-325 MG PO TABS: 1.0000 | ORAL_TABLET | ORAL | 0 refills | Status: AC | PRN

## 2021-12-28 MED ORDER — LACTATED RINGERS IV SOLN
INTRAVENOUS | Status: AC | PRN
  Administered 2021-12-28: 20 mL/h via INTRAVENOUS

## 2021-12-28 SURGICAL SUPPLY — 1 items: SuperLock Fiducial MArker IMPLANT

## 2021-12-28 NOTE — Anesthesia Procedure Notes (Signed)
Procedure Name: Intubation Date/Time: 12/28/2021 12:50 PM  Performed by: Inda Coke, CRNAPre-anesthesia Checklist: Patient identified, Emergency Drugs available, Suction available, Timeout performed and Patient being monitored Patient Re-evaluated:Patient Re-evaluated prior to induction Oxygen Delivery Method: Circle system utilized Preoxygenation: Pre-oxygenation with 100% oxygen Induction Type: IV induction Ventilation: Mask ventilation without difficulty Laryngoscope Size: Mac and 4 Grade View: Grade I Tube type: Oral Tube size: 8.5 mm Number of attempts: 1 Airway Equipment and Method: Stylet Placement Confirmation: ETT inserted through vocal cords under direct vision, positive ETCO2, CO2 detector and breath sounds checked- equal and bilateral Secured at: 23 cm Tube secured with: Tape Dental Injury: Teeth and Oropharynx as per pre-operative assessment
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
## 2021-12-28 NOTE — Progress Notes (Signed)
Progress Note  2 Days Post-Op  Subjective: Sitting up in chair. No nausea or emesis with diet yesterday. NPO for bx today. Pain well controlled - states oxycodone makes him itch and he prefers hydrocodone  Friend is bedside  Objective: Vital signs in last 24 hours: Temp:  [98 F (36.7 C)-98.2 F (36.8 C)] 98 F (36.7 C) (11/02 0753) Pulse Rate:  [75-84] 75 (11/02 0753) Resp:  [17-18] 18 (11/02 0753) BP: (131-165)/(52-75) 131/64 (11/02 0753) SpO2:  [95 %-100 %] 97 % (11/02 0753) Last BM Date : 12/24/21  Intake/Output from previous day: 11/01 0701 - 11/02 0700 In: 378.9 [P.O.:240; IV Piggyback:138.9] Out: -  Intake/Output this shift: No intake/output data recorded.  PE: General: pleasant, WD, male who is sitting up in chair in NAD Lungs:  Respiratory effort nonlabored on room air Abd: soft, ND, minimally tender to palpation around incisions which are c/d/I with surgical glue MSK: all 4 extremities are symmetrical with no cyanosis, clubbing, or edema. Skin: warm and dry Psych: A&Ox3 with an appropriate affect.    Lab Results:  Recent Labs    12/27/21 0341 12/28/21 0257  WBC 22.7* 23.6*  HGB 11.7* 11.5*  HCT 34.8* 33.5*  PLT 276 269    BMET Recent Labs    12/26/21 0255 12/27/21 0341  NA 141 141  K 3.4* 4.3  CL 105 108  CO2 22 25  GLUCOSE 87 152*  BUN 26* 18  CREATININE 1.84* 1.60*  CALCIUM 8.7* 8.8*    PT/INR Recent Labs    12/28/21 0257  LABPROT 14.8  INR 1.2   CMP     Component Value Date/Time   NA 141 12/27/2021 0341   NA 139 11/07/2016 1155   K 4.3 12/27/2021 0341   K 4.0 11/07/2016 1155   CL 108 12/27/2021 0341   CO2 25 12/27/2021 0341   CO2 25 11/07/2016 1155   GLUCOSE 152 (H) 12/27/2021 0341   GLUCOSE 103 11/07/2016 1155   BUN 18 12/27/2021 0341   BUN 12.6 11/07/2016 1155   CREATININE 1.60 (H) 12/27/2021 0341   CREATININE 1.2 11/07/2016 1155   CALCIUM 8.8 (L) 12/27/2021 0341   CALCIUM 9.1 11/07/2016 1155   PROT 5.7 (L)  12/26/2021 0255   PROT 7.0 11/07/2016 1155   ALBUMIN 2.9 (L) 12/26/2021 0255   ALBUMIN 3.7 11/07/2016 1155   AST 18 12/26/2021 0255   AST 30 11/07/2016 1155   ALT 17 12/26/2021 0255   ALT 49 11/07/2016 1155   ALKPHOS 82 12/26/2021 0255   ALKPHOS 84 11/07/2016 1155   BILITOT 1.1 12/26/2021 0255   BILITOT 0.70 11/07/2016 1155   GFRNONAA 47 (L) 12/27/2021 0341   GFRAA >60 06/20/2019 1955   Lipase     Component Value Date/Time   LIPASE 43 01/16/2021 1208       Studies/Results: ECHOCARDIOGRAM COMPLETE  Result Date: 12/26/2021    ECHOCARDIOGRAM REPORT   Patient Name:   Brendan Butler Date of Exam: 12/26/2021 Medical Rec #:  [id number]        Height:       70.0 in Accession #:    [id number]       Weight:       222.7 lb Date of Birth:  February 04, 1951       BSA:          2.185 m Patient Age:    30 years         BP:  159/70 mmHg Patient Gender: M                HR:           84 bpm. Exam Location:  Inpatient Procedure: 2D Echo, Color Doppler and Cardiac Doppler Indications:    CHF - acute systolic  History:        Patient has prior history of Echocardiogram examinations, most                 recent 03/09/2013. CHF, CAD and Previous Myocardial Infarction,                 Arrythmias:Bradycardia; Risk Factors:Hypertension, Dyslipidemia                 and Sleep Apnea. CKD.  Sonographer:    Milda Smart Referring Phys: [id number] JENNIFER YATES  Sonographer Comments: Technically difficult study due to poor echo windows. Image acquisition challenging due to patient body habitus and Image acquisition challenging due to respiratory motion. IMPRESSIONS  1. Left ventricular ejection fraction, by estimation, is 55 to 60%. The left ventricle has normal function. The left ventricle demonstrates regional wall motion abnormalities (suggestive of prior anteroseptal/LAD infarct). The left ventricular internal cavity size was mildly dilated. Left ventricular diastolic parameters are consistent with Grade I  diastolic dysfunction (impaired relaxation).  2. Right ventricular systolic function is normal. The right ventricular size is normal. Tricuspid regurgitation signal is inadequate for assessing PA pressure.  3. The mitral valve is grossly normal. No evidence of mitral valve regurgitation. No evidence of mitral stenosis.  4. The aortic valve is tricuspid. Aortic valve regurgitation is not visualized. No aortic stenosis is present. Comparison(s): LVEF has improve from prior reporting. FINDINGS  Left Ventricle: Left ventricular ejection fraction, by estimation, is 55 to 60%. The left ventricle has normal function. The left ventricle demonstrates regional wall motion abnormalities. The left ventricular internal cavity size was mildly dilated. There is no left ventricular hypertrophy. Left ventricular diastolic parameters are consistent with Grade I diastolic dysfunction (impaired relaxation).  LV Wall Scoring: The entire anterior septum is hypokinetic. Right Ventricle: The right ventricular size is normal. No increase in right ventricular wall thickness. Right ventricular systolic function is normal. Tricuspid regurgitation signal is inadequate for assessing PA pressure. Left Atrium: Left atrial size was normal in size. Right Atrium: Right atrial size was normal in size. Pericardium: Trivial pericardial effusion is present. Presence of epicardial fat layer. Mitral Valve: The mitral valve is grossly normal. No evidence of mitral valve regurgitation. No evidence of mitral valve stenosis. Tricuspid Valve: The tricuspid valve is not well visualized. Tricuspid valve regurgitation is not demonstrated. No evidence of tricuspid stenosis. Aortic Valve: The aortic valve is tricuspid. Aortic valve regurgitation is not visualized. No aortic stenosis is present. Pulmonic Valve: The pulmonic valve was not well visualized. Pulmonic valve regurgitation is not visualized. No evidence of pulmonic stenosis. Aorta: The aortic root,  ascending aorta and aortic arch are all structurally normal, with no evidence of dilitation or obstruction. IAS/Shunts: No atrial level shunt detected by color flow Doppler.  LEFT VENTRICLE PLAX 2D LVIDd:         5.80 cm      Diastology LVIDs:         4.00 cm      LV e' medial:    5.66 cm/s LV PW:         0.90 cm      LV E/e' medial:  11.0 LV IVS:  0.90 cm      LV e' lateral:   8.70 cm/s LVOT diam:     2.10 cm      LV E/e' lateral: 7.2 LV SV:         85 LV SV Index:   39 LVOT Area:     3.46 cm  LV Volumes (MOD) LV vol d, MOD A2C: 132.0 ml LV vol d, MOD A4C: 131.0 ml LV vol s, MOD A2C: 60.7 ml LV vol s, MOD A4C: 54.5 ml LV SV MOD A2C:     71.3 ml LV SV MOD A4C:     131.0 ml LV SV MOD BP:      74.0 ml RIGHT VENTRICLE RV S prime:     18.20 cm/s TAPSE (M-mode): 2.3 cm LEFT ATRIUM             Index        RIGHT ATRIUM           Index LA diam:        3.80 cm 1.74 cm/m   RA Area:     12.90 cm LA Vol (A2C):   34.2 ml 15.65 ml/m  RA Volume:   27.80 ml  12.72 ml/m LA Vol (A4C):   48.2 ml 22.06 ml/m LA Biplane Vol: 44.0 ml 20.14 ml/m  AORTIC VALVE LVOT Vmax:   127.00 cm/s LVOT Vmean:  95.400 cm/s LVOT VTI:    0.244 m  AORTA Ao Root diam: 2.80 cm Ao Asc diam:  3.90 cm MITRAL VALVE MV Area (PHT): 2.26 cm    SHUNTS MV Decel Time: 336 msec    Systemic VTI:  0.24 m MV E velocity: 62.50 cm/s  Systemic Diam: 2.10 cm Riley Lam MD Electronically signed by Riley Lam MD Signature Date/Time: 12/26/2021/4:26:57 PM    Final     Anti-infectives: Anti-infectives (From admission, onward)    Start     Dose/Rate Route Frequency Ordered Stop   12/28/21 1000  amoxicillin-clavulanate (AUGMENTIN) 875-125 MG per tablet 1 tablet        1 tablet Oral Every 12 hours 12/28/21 0743 01/02/22 0959   12/25/21 1045  piperacillin-tazobactam (ZOSYN) IVPB 3.375 g        3.375 g 12.5 mL/hr over 240 Minutes Intravenous Every 8 hours 12/25/21 1029 12/27/21 2359        Assessment/Plan  Acute cholecystitis POD2  s/p laparoscopic cholecystectomy with LOA 10/31 Dr. Janee Morn  - cardiac clearance obtained preoperatively - hgb stable, can resume effient from surgical standpoint but defer to primary given bx - continued leukocytosis this am after zosyn completed - WBC 23.6 (22.7). Recommend transition to PO Augmentin to complete 7 days abx total  FEN: NPO for bx ID: zosyn stopped 11/1. Augmentin to complete 7 days VTE: scds. Okay for chemical ppx from surgical standpoint  Enlarging pulmonary nodules - pulm planning bx 11/2    LOS: 3 days   Eric Form, [facility] 12/28/2021, 8:59 AM Please see Amion for pager number during day hours 7:00am-4:30pm

## 2021-12-28 NOTE — TOC Initial Note (Signed)
Transition of Care [facility]) - Initial/Assessment Note    Patient Details  Name: Brendan Butler MRN: [id number] Date of Birth: [birth year]  Transition [facility] CM/SW Contact:    Marilu Favre, RN Phone Number: 12/28/2021, 10:07 AM  Clinical Narrative:                 Patient from home alone.   Discussed PT recommendation for OP PT and tub bench. Patient in agreement.   Visitor stated doctor told him he cannot drive for 4 weeks. Patient states he has friends/family who can drive him twice a week for OP PT.   Patient prefers [facility] location. Order placed for MD signature.    Tub Bench ordered with [place] with Letta Kocher.   Expected Discharge Plan: Home/Self Care Barriers to Discharge: Continued Medical Work up   Patient Goals and CMS Choice Patient states their goals for this hospitalization and ongoing recovery are:: to return to home CMS Medicare.gov Compare Post Acute Care list provided to:: Patient Choice offered to / list presented to : Patient  Expected Discharge Plan and Services Expected Discharge Plan: Home/Self Care   Discharge Planning Services: CM Consult Post Acute Care Choice:  (OP PT)                   DME Arranged:  (tub bench) DME Agency: [employer] Date DME Agency Contacted: 12/28/21 Time DME Agency Contacted: 56 Representative spoke with at DME Agency: [place]: NA          Prior Living Arrangements/Services   Lives with:: Self Patient language and need for interpreter reviewed:: Yes Do you feel safe going back to the place where you live?: Yes      Need for Family Participation in Patient Care: Yes (Comment) Care giver support system in place?: Yes (comment)   Criminal Activity/Legal Involvement Pertinent to Current Situation/Hospitalization: No - Comment as needed  Activities of Daily Living Home Assistive Devices/Equipment: Walker (specify type) ADL Screening (condition at time of admission) Patient's cognitive  ability adequate to safely complete daily activities?: Yes Is the patient deaf or have difficulty hearing?: No Does the patient have difficulty seeing, even when wearing glasses/contacts?: No Does the patient have difficulty concentrating, remembering, or making decisions?: No Patient able to express need for assistance with ADLs?: Yes Does the patient have difficulty dressing or bathing?: No Independently performs ADLs?: Yes (appropriate for developmental age) Does the patient have difficulty walking or climbing stairs?: No Weakness of Legs: Both Weakness of Arms/Hands: Both  Permission Sought/Granted                  Emotional Assessment Appearance:: Appears stated age Attitude/Demeanor/Rapport: Engaged Affect (typically observed): Accepting Orientation: : Oriented to Self, Oriented to Place, Oriented to  Time, Oriented to Situation Alcohol / Substance Use: Not Applicable Psych Involvement: No (comment)  Admission diagnosis:  Acute cholecystitis [K81.0] Hypokalemia [E87.6] Cholecystitis [K81.9] Pulmonary nodules [R91.8] Patient Active Problem List   Diagnosis Date Noted   Pulmonary nodules 12/27/2021   Acute cholecystitis 12/25/2021   Symptomatic bradycardia 12/25/2021   Preoperative cardiovascular examination    Intra-abdominal abscess ([place]) 01/17/2021   Lung nodule 01/17/2021   Acute-on-chronic kidney injury ([place]) 01/17/2021   Aneurysm of ascending aorta ([place]) 01/17/2021   Stage 3a chronic kidney disease (CKD) ([place]) 01/17/2021   Weight loss 01/17/2021   Bronchiectasis ([place]) 01/17/2021   Chronic cholecystitis 01/16/2021   Traumatic hematoma of left popliteal region 01/28/2016   DJD (degenerative joint  disease) of pelvis 01/28/2016   Atypical chest pain    Chest pain 01/27/2016   Unstable angina ([place]) 04/21/2013   Leukocytosis 04/21/2013   Subsequent ST elevation (STEMI) myocardial infarction of anterior wall within 4 weeks of initial infarction [facility]) 01/08/2013     Class: Hospitalized for   Illiteracy and low-level literacy 01/08/2013   Aspirin allergy- hives 01/06/2013   STEMI 12/29/12 Rx'd with LAD DES with early stent thrombosis, STEMI-PCI 01/05/14 01/05/2013   Coronary artery disease involving native coronary artery of native heart without angina pectoris 06/29/2007   Dyslipidemia 06/25/2007   Essential hypertension 06/25/2007   ALLERGIC RHINITIS 06/25/2007   SLEEP APNEA- C-pap intol 06/25/2007   PCP:  Wenda Low, MD Pharmacy:   [id number] - [place], [place] [address] Phone: [phone] Fax: [phone]  [facility] [place], [place] [address] Phone: [phone] Fax: [phone]     Social Determinants of Health (SDOH) Interventions    Readmission Risk Interventions     No data to display

## 2021-12-28 NOTE — Interval H&P Note (Signed)
History and Physical Interval Note:  12/28/2021 12:07 PM  Brendan Butler  has presented today for surgery, with the diagnosis of pulmonary nodules.  The various methods of treatment have been discussed with the patient and family. After consideration of risks, benefits and other options for treatment, the patient has consented to  Procedure(s): ROBOTIC ASSISTED NAVIGATIONAL BRONCHOSCOPY (Right) as a surgical intervention.  The patient's history has been reviewed, patient examined, no change in status, stable for surgery.  I have reviewed the patient's chart and labs.  Questions were answered to the patient's satisfaction.     Collene Gobble

## 2021-12-28 NOTE — Progress Notes (Signed)
Subjective:  No cardiac symptoms For pulmonary biopsy today  Objective:  Vitals:   12/27/21 0740 12/27/21 1554 12/27/21 2014 12/28/21 0434  BP: (!) 146/64 (!) 145/52 (!) 165/62 (!) 162/75  Pulse: (!) 56 79 75 84  Resp: 17 17 18 18   Temp: 97.6 F (36.4 C) 98 F (36.7 C) 98.1 F (36.7 C) 98.2 F (36.8 C)  TempSrc: Oral Oral Oral Oral  SpO2: 98% 97% 100% 95%  Weight:      Height:        Intake/Output from previous day:  Intake/Output Summary (Last 24 hours) at 12/28/2021 0744 Last data filed at 12/27/2021 2014 Gross per 24 hour  Intake 378.91 ml  Output --  Net 378.91 ml    Physical Exam: Affect appropriate Healthy:  appears stated age HEENT: normal Neck supple with no adenopathy JVP normal no bruits no thyromegaly Lungs rhonchi exp wheeze Heart:  S1/S2 no murmur, no rub, gallop or click PMI normal Abdomen: post lap choly  Distal pulses intact with no bruits No edema Neuro non-focal Skin warm and dry No muscular weakness   Lab Results: Basic Metabolic Panel: Recent Labs    12/26/21 0255 12/27/21 0341  NA 141 141  K 3.4* 4.3  CL 105 108  CO2 22 25  GLUCOSE 87 152*  BUN 26* 18  CREATININE 1.84* 1.60*  CALCIUM 8.7* 8.8*   Liver Function Tests: Recent Labs    12/26/21 0255  AST 18  ALT 17  ALKPHOS 82  BILITOT 1.1  PROT 5.7*  ALBUMIN 2.9*   No results for input(s): "LIPASE", "AMYLASE" in the last 72 hours. CBC: Recent Labs    12/27/21 0341 12/28/21 0257  WBC 22.7* 23.6*  NEUTROABS 18.8*  --   HGB 11.7* 11.5*  HCT 34.8* 33.5*  MCV 93.3 92.8  PLT 276 269     Imaging: ECHOCARDIOGRAM COMPLETE  Result Date: 12/26/2021    ECHOCARDIOGRAM REPORT   Patient Name:   DIXIE JAFRI Date of Exam: 12/26/2021 Medical Rec #:  12/28/2021        Height:       70.0 in Accession #:    [id number]       Weight:       222.7 lb Date of Birth:  [birth year]       BSA:          2.185 m Patient Age:    71 years         BP:           159/70 mmHg Patient  Gender: M                HR:           84 bpm. Exam Location:  Inpatient Procedure: 2D Echo, Color Doppler and Cardiac Doppler Indications:    CHF - acute systolic  History:        Patient has prior history of Echocardiogram examinations, most                 recent 03/09/2013. CHF, CAD and Previous Myocardial Infarction,                 Arrythmias:Bradycardia; Risk Factors:Hypertension, Dyslipidemia                 and Sleep Apnea. CKD.  Sonographer:    05/07/2013 Referring Phys: [id number] JENNIFER YATES  Sonographer Comments: Technically difficult study due to poor echo windows. Image acquisition challenging due to  patient body habitus and Image acquisition challenging due to respiratory motion. IMPRESSIONS  1. Left ventricular ejection fraction, by estimation, is 55 to 60%. The left ventricle has normal function. The left ventricle demonstrates regional wall motion abnormalities (suggestive of prior anteroseptal/LAD infarct). The left ventricular internal cavity size was mildly dilated. Left ventricular diastolic parameters are consistent with Grade I diastolic dysfunction (impaired relaxation).  2. Right ventricular systolic function is normal. The right ventricular size is normal. Tricuspid regurgitation signal is inadequate for assessing PA pressure.  3. The mitral valve is grossly normal. No evidence of mitral valve regurgitation. No evidence of mitral stenosis.  4. The aortic valve is tricuspid. Aortic valve regurgitation is not visualized. No aortic stenosis is present. Comparison(s): LVEF has improve from prior reporting. FINDINGS  Left Ventricle: Left ventricular ejection fraction, by estimation, is 55 to 60%. The left ventricle has normal function. The left ventricle demonstrates regional wall motion abnormalities. The left ventricular internal cavity size was mildly dilated. There is no left ventricular hypertrophy. Left ventricular diastolic parameters are consistent with Grade I diastolic dysfunction  (impaired relaxation).  LV Wall Scoring: The entire anterior septum is hypokinetic. Right Ventricle: The right ventricular size is normal. No increase in right ventricular wall thickness. Right ventricular systolic function is normal. Tricuspid regurgitation signal is inadequate for assessing PA pressure. Left Atrium: Left atrial size was normal in size. Right Atrium: Right atrial size was normal in size. Pericardium: Trivial pericardial effusion is present. Presence of epicardial fat layer. Mitral Valve: The mitral valve is grossly normal. No evidence of mitral valve regurgitation. No evidence of mitral valve stenosis. Tricuspid Valve: The tricuspid valve is not well visualized. Tricuspid valve regurgitation is not demonstrated. No evidence of tricuspid stenosis. Aortic Valve: The aortic valve is tricuspid. Aortic valve regurgitation is not visualized. No aortic stenosis is present. Pulmonic Valve: The pulmonic valve was not well visualized. Pulmonic valve regurgitation is not visualized. No evidence of pulmonic stenosis. Aorta: The aortic root, ascending aorta and aortic arch are all structurally normal, with no evidence of dilitation or obstruction. IAS/Shunts: No atrial level shunt detected by color flow Doppler.  LEFT VENTRICLE PLAX 2D LVIDd:         5.80 cm      Diastology LVIDs:         4.00 cm      LV e' medial:    5.66 cm/s LV PW:         0.90 cm      LV E/e' medial:  11.0 LV IVS:        0.90 cm      LV e' lateral:   8.70 cm/s LVOT diam:     2.10 cm      LV E/e' lateral: 7.2 LV SV:         85 LV SV Index:   39 LVOT Area:     3.46 cm  LV Volumes (MOD) LV vol d, MOD A2C: 132.0 ml LV vol d, MOD A4C: 131.0 ml LV vol s, MOD A2C: 60.7 ml LV vol s, MOD A4C: 54.5 ml LV SV MOD A2C:     71.3 ml LV SV MOD A4C:     131.0 ml LV SV MOD BP:      74.0 ml RIGHT VENTRICLE RV S prime:     18.20 cm/s TAPSE (M-mode): 2.3 cm LEFT ATRIUM             Index        RIGHT  ATRIUM           Index LA diam:        3.80 cm 1.74 cm/m    RA Area:     12.90 cm LA Vol (A2C):   34.2 ml 15.65 ml/m  RA Volume:   27.80 ml  12.72 ml/m LA Vol (A4C):   48.2 ml 22.06 ml/m LA Biplane Vol: 44.0 ml 20.14 ml/m  AORTIC VALVE LVOT Vmax:   127.00 cm/s LVOT Vmean:  95.400 cm/s LVOT VTI:    0.244 m  AORTA Ao Root diam: 2.80 cm Ao Asc diam:  3.90 cm MITRAL VALVE MV Area (PHT): 2.26 cm    SHUNTS MV Decel Time: 336 msec    Systemic VTI:  0.24 m MV E velocity: 62.50 cm/s  Systemic Diam: 2.10 cm Riley Lam MD Electronically signed by Riley Lam MD Signature Date/Time: 12/26/2021/4:26:57 PM    Final     Cardiac Studies:  ECG: SR/Sinus arrhythmia non specific ST changes    Telemetry:  Echo: pending  Medications:    amoxicillin-clavulanate  1 tablet Oral Q12H   buPROPion  450 mg Oral Daily   fluticasone  1 spray Each Nare Daily   PARoxetine  30 mg Oral Daily   pregabalin  450 mg Oral Daily   rosuvastatin  10 mg Oral Daily      lactated ringers 75 mL/hr at 12/25/21 2338    Assessment/Plan:  Brendan Butler is a 71 y.o. male with a hx of STEMI 2014-with PCI to LAD, repeat STEMI 1 week later presented with STEMI + Vfib with cath, new in stent thrombosis, HLD, HTN , lymphocytosis for 14 years   who is being seen 12/25/2021 for the evaluation of pre-op and bradycardia at the request of Dr. Demaris Callander. Sheliah Hatch.  Post Operative : no complications WBC still up surgical incisions ok  Bradycardia: no AV block  GB:   changed to PO Augmentin  Pulmonary ? ILD and adenocarcinoma Quit smoking long time ago but 50 pack year history and environmental exposures from welding Dr Delton Coombes has seen and plans biopsy of cavitary nodules Continue to hold Effient until after biopsy HLD:  continue statin   Please resume Effient after biopsy done  Charlton Haws 12/28/2021, 7:44 AM

## 2021-12-28 NOTE — Progress Notes (Signed)
Physical Therapy Treatment Patient Details Name: Brendan Butler MRN: [id number] DOB: 08-28-1950 Today's Date: 12/28/2021   History of Present Illness Pt is a 71 y.o. male who presented 12/24/21 with abdominal pain, intermittent AMS, and multiple episodes of syncope. Noted to be bradycardic, dizzy, and diaphoretic in triage. Pt admitted with symptomatic cholelithiasis. S/p laparoscopic cholecystectomy with LOA 10/31. Imaging also revealed concern for potential low-grade adenocarcinoma. PMH: CAD s/p stent (2014, had acute in-stent thrombosis with VF arrest requiring defib x 3 and then recurrent in-stent restenosis treated with cutting balloon angioplasty); HTN; HLD; chronic systolic CHF; and OSA not on CPAP    PT Comments    Pt progressing well with mobility, ambulated 450' working on changing pace and direction. Prior to ambulation pt performed postural correction exercises in room as pt maintains hip flexion and fwd head posture. After ambulation pt performed balance and strengthening exercises in standing. Continue to recommend outpt PT at d/c. Pt safe for d/c home from a PT standpoint.    Recommendations for follow up therapy are one component of a multi-disciplinary discharge planning process, led by the attending physician.  Recommendations may be updated based on patient status, additional functional criteria and insurance authorization.  Follow Up Recommendations  Outpatient PT     Assistance Recommended at Discharge Intermittent Supervision/Assistance  Patient can return home with the following Assistance with cooking/housework;Assist for transportation;Help with stairs or ramp for entrance   Equipment Recommendations  Other (comment) (tub bench)    Recommendations for Other Services       Precautions / Restrictions Precautions Precautions: Fall Restrictions Weight Bearing Restrictions: No     Mobility  Bed Mobility               General bed mobility comments: pt  received OOB    Transfers Overall transfer level: Needs assistance Equipment used: None Transfers: Sit to/from Stand Sit to Stand: Modified independent (Device/Increase time)           General transfer comment: pt stands and sits safely from chair multiple times    Ambulation/Gait Ambulation/Gait assistance: Supervision Gait Distance (Feet): 450 Feet Assistive device: None, IV Pole Gait Pattern/deviations: Step-through pattern, Decreased stride length, Staggering left, Staggering right Gait velocity: reduced Gait velocity interpretation: >2.62 ft/sec, indicative of community ambulatory Pre-gait activities: standing eyes closed x 1 min, heel lift with UE support x10, postural extension exercises in standing and with back against wall General Gait Details: began with IV pole first 56' and then no support. Worked on increasing pace, arm swing, bkwd walking, and side stepping. No LOB   Stairs             Wheelchair Mobility    Modified Rankin (Stroke Patients Only)       Balance Overall balance assessment: Mild deficits observed, not formally tested                                          Cognition Arousal/Alertness: Awake/alert Behavior During Therapy: WFL for tasks assessed/performed Overall Cognitive Status: Within Functional Limits for tasks assessed                                          Exercises Other Exercises Other Exercises: SL stance 1 min R/L Other Exercises: Tandem stance 1 min  R/L Other Exercises: mini squats 10x Other Exercises: chin tucks against wall 10x Other Exercises: standing hip ext 10x    General Comments General comments (skin integrity, edema, etc.): VSS on RA      Pertinent Vitals/Pain Pain Assessment Pain Assessment: Faces Faces Pain Scale: Hurts a little bit Pain Location: abdomen Pain Descriptors / Indicators: Discomfort Pain Intervention(s): Limited activity within patient's  tolerance, Monitored during session    Home Living                          Prior Function            PT Goals (current goals can now be found in the care plan section) Acute Rehab PT Goals Patient Stated Goal: to get better PT Goal Formulation: With patient Time For Goal Achievement: 01/10/22 Potential to Achieve Goals: Good Progress towards PT goals: Progressing toward goals    Frequency    Min 3X/week      PT Plan Current plan remains appropriate    Co-evaluation              AM-PAC PT "6 Clicks" Mobility   Outcome Measure  Help needed turning from your back to your side while in a flat bed without using bedrails?: None Help needed moving from lying on your back to sitting on the side of a flat bed without using bedrails?: None Help needed moving to and from a bed to a chair (including a wheelchair)?: None Help needed standing up from a chair using your arms (e.g., wheelchair or bedside chair)?: A Little Help needed to walk in hospital room?: A Little Help needed climbing 3-5 steps with a railing? : A Little 6 Click Score: 21    End of Session   Activity Tolerance: Patient tolerated treatment well Patient left: with call bell/phone within reach;in chair;with family/visitor present Nurse Communication: Mobility status PT Visit Diagnosis: Unsteadiness on feet (R26.81);Other abnormalities of gait and mobility (R26.89);Muscle weakness (generalized) (M62.81);History of falling (Z91.81);Difficulty in walking, not elsewhere classified (R26.2);Repeated falls (R29.6)     Time: 4403-4742 PT Time Calculation (min) (ACUTE ONLY): 25 min  Charges:  $Gait Training: 8-22 mins $Therapeutic Exercise: 8-22 mins                     Leighton Roach, PT  Acute Rehab Services Secure chat preferred Office [place] 12/28/2021, 9:39 AM

## 2021-12-28 NOTE — Op Note (Signed)
Video Bronchoscopy with Robotic Assisted Bronchoscopic Navigation   Date of Operation: 12/28/2021   Pre-op Diagnosis: Right lower lobe pulmonary nodules  Post-op Diagnosis: Same  Surgeon: Baltazar Apo  Assistants: None  Anesthesia: General endotracheal anesthesia  Operation: Flexible video fiberoptic bronchoscopy with robotic assistance and biopsies.  Estimated Blood Loss: Minimal  Complications: None  Indications and History: Brendan Butler is a 71 y.o. male with history of tobacco use.  He is slowly enlarging right lower lobe pulmonary nodules, 1 groundglass nodule and another more irregular cavitary lesion.  Recommendation made to achieve tissue diagnosis and to obtain culture data via robotic assisted navigational bronchoscopy.  The risks, benefits, complications, treatment options and expected outcomes were discussed with the patient.  The possibilities of pneumothorax, pneumonia, reaction to medication, pulmonary aspiration, perforation of a viscus, bleeding, failure to diagnose a condition and creating a complication requiring transfusion or operation were discussed with the patient who freely signed the consent.    Description of Procedure: The patient was seen in the Preoperative Area, was examined and was deemed appropriate to proceed.  The patient was taken to [facility] endoscopy room 3, identified as Brendan Butler and the procedure verified as Flexible Video Fiberoptic Bronchoscopy.  A Time Out was held and the above information confirmed.   Prior to the date of the procedure a high-resolution CT scan of the chest was performed. Utilizing ION software program a virtual tracheobronchial tree was generated to allow the creation of distinct navigation pathways to the patient's parenchymal abnormalities. After being taken to the operating room general anesthesia was initiated and the patient  was orally intubated. The video fiberoptic bronchoscope was introduced via the endotracheal  tube and a general inspection was performed which showed normal right and left lung anatomy. Aspiration of the bilateral mainstems was completed to remove any remaining secretions. Robotic catheter inserted into patient's endotracheal tube.   Target #2 right lower lobe groundglass pulmonary nodule: The groundglass nodule labeled target to was approached first since it was felt that visualization will be complicated by passive atelectasis.  The distinct navigation pathways prepared prior to this procedure were then utilized to navigate to patient's lesion identified on CT scan. The robotic catheter was secured into place and the vision probe was withdrawn.  Lesion location was approximated using fluoroscopy and Cios three-dimensional imaging. Under fluoroscopic guidance transbronchial brushings were performed to be sent for cytology.  No needle samples were performed as the target was poorly visualized even on Cios spin.   Target #1 irregular right lower lobe cavitary nodule: The distinct navigation pathways prepared prior to this procedure were then utilized to navigate to patient's lesion identified on CT scan. The robotic catheter was secured into place and the vision probe was withdrawn.  Lesion location was approximated using fluoroscopy and Cios three-dimensional imaging. Under fluoroscopic guidance transbronchial needle brushings, transbronchial needle biopsies, and transbronchial forceps biopsies were performed to be sent for cytology and pathology. A bronchioalveolar lavage was performed in the right lower lobe and sent for cytology.  Fluoroscopic guidance a single fiducial marker was placed adjacent to the nodule.  At the end of the procedure a general airway inspection was performed and there was no evidence of active bleeding. The bronchoscope was removed.  The patient tolerated the procedure well. There was no significant blood loss and there were no obvious complications. A post-procedural chest  x-ray is pending.   Samples Target #2: 1. Transbronchial brushings from right lower lobe groundglass nodule  Samples  Target #1: 1. Transbronchial needle brushings from right lower lobe irregular cavitary nodule 2. Transbronchial Wang needle biopsies from right lower lobe irregular cavitary nodule 3. Transbronchial forceps biopsies from right lower lobe irregular cavitary nodule 4. Bronchoalveolar lavage from right lower lobe   Plans:  The patient will be transferred ed from the PACU to his regular hospital room when recovered from anesthesia and after chest x-ray is reviewed. We will review the cytology, pathology and microbiology results with the patient when they become available. Outpatient followup will be with D Garvey Westcott.   Baltazar Apo, MD, PhD 12/28/2021, 2:11 PM [facility] [phone] or if no answer before 7:00PM call [phone] For any issues after 7:00PM please call eLink [phone]

## 2021-12-28 NOTE — Anesthesia Postprocedure Evaluation (Signed)
Anesthesia Post Note  Patient: CELEDONIO SORTINO  Procedure(s) Performed: ROBOTIC ASSISTED NAVIGATIONAL BRONCHOSCOPY (Right) BRONCHIAL BRUSHINGS BRONCHIAL BIOPSIES BRONCHIAL NEEDLE ASPIRATION BIOPSIES BRONCHIAL WASHINGS FIDUCIAL MARKER PLACEMENT     Patient location during evaluation: PACU Anesthesia Type: General Level of consciousness: awake and alert Pain management: pain level controlled Vital Signs Assessment: post-procedure vital signs reviewed and stable Respiratory status: spontaneous breathing, nonlabored ventilation and respiratory function stable Cardiovascular status: blood pressure returned to baseline and stable Postop Assessment: no apparent nausea or vomiting Anesthetic complications: no  No notable events documented.  Last Vitals:  Vitals:   12/28/21 1424 12/28/21 1434  BP: (!) 109/54 (!) 117/49  Pulse: 77 79  Resp: 19 16  Temp:  36.5 C  SpO2: 95% 95%    Last Pain:  Vitals:   12/28/21 1434  TempSrc: Tympanic  PainSc: 0-No pain                 Elizbeth Posa,W. EDMOND

## 2021-12-28 NOTE — Progress Notes (Signed)
Mobility Specialist - Progress Note   12/28/21 1525  Mobility  Activity Ambulated with assistance in hallway  Level of Assistance Standby assist, set-up cues, supervision of patient - no hands on  Assistive Device None  Distance Ambulated (ft) 300 ft  Activity Response Tolerated well  $Mobility charge 1 Mobility    Pt received in bed agreeable to mobility. Left in bed w/ call bell in reach and all needs met.   Paulla Dolly Mobility Specialist

## 2021-12-28 NOTE — Telephone Encounter (Signed)
Pt needs a hosp f/u visit with RB. Can be next available, not in a special slot.

## 2021-12-28 NOTE — Anesthesia Preprocedure Evaluation (Addendum)
Anesthesia Evaluation  Patient identified by MRN, date of birth, ID band Patient awake    Reviewed: Allergy & Precautions, H&P , NPO status , Patient's Chart, lab work & pertinent test results  Airway Mallampati: II  TM Distance: >3 FB Neck ROM: Full    Dental no notable dental hx. (+) Poor Dentition, Dental Advisory Given   Pulmonary sleep apnea , former smoker   Pulmonary exam normal breath sounds clear to auscultation       Cardiovascular hypertension, Pt. on medications + CAD, + Past MI and + Cardiac Stents   Rhythm:Regular Rate:Normal     Neuro/Psych negative neurological ROS  negative psych ROS   GI/Hepatic negative GI ROS, Neg liver ROS,,,  Endo/Other  negative endocrine ROS    Renal/GU Renal InsufficiencyRenal disease  negative genitourinary   Musculoskeletal  (+) Arthritis , Osteoarthritis,    Abdominal   Peds  Hematology negative hematology ROS (+)   Anesthesia Other Findings   Reproductive/Obstetrics negative OB ROS                             Anesthesia Physical Anesthesia Plan  ASA: 3  Anesthesia Plan: General   Post-op Pain Management: Tylenol PO (pre-op)*   Induction: Intravenous  PONV Risk Score and Plan: 3 and Ondansetron, Propofol infusion and TIVA  Airway Management Planned: Oral ETT  Additional Equipment:   Intra-op Plan:   Post-operative Plan: Extubation in OR  Informed Consent: I have reviewed the patients History and Physical, chart, labs and discussed the procedure including the risks, benefits and alternatives for the proposed anesthesia with the patient or authorized representative who has indicated his/her understanding and acceptance.     Dental advisory given  Plan Discussed with: CRNA  Anesthesia Plan Comments:        Anesthesia Quick Evaluation

## 2021-12-28 NOTE — Care Management Important Message (Signed)
Important Message  Patient Details  Name: Brendan Butler MRN: [id number] Date of Birth: October 12, 1950   Medicare Important Message Given:  Yes     Hannah Beat 12/28/2021, 12:27 PM

## 2021-12-28 NOTE — Transfer of Care (Signed)
Immediate Anesthesia Transfer of Care Note  Patient: Brendan Butler  Procedure(s) Performed: ROBOTIC ASSISTED NAVIGATIONAL BRONCHOSCOPY (Right) BRONCHIAL BRUSHINGS BRONCHIAL BIOPSIES BRONCHIAL NEEDLE ASPIRATION BIOPSIES BRONCHIAL WASHINGS FIDUCIAL MARKER PLACEMENT  Patient Location: PACU  Anesthesia Type:General  Level of Consciousness: awake and alert   Airway & Oxygen Therapy: Patient Spontanous Breathing  Post-op Assessment: Report given to RN and Post -op Vital signs reviewed and stable  Post vital signs: Reviewed and stable  Last Vitals:  Vitals Value Taken Time  BP 128/54 12/28/21 1414  Temp 36.2 C 12/28/21 1414  Pulse 68 12/28/21 1414  Resp 17 12/28/21 1414  SpO2 96 % 12/28/21 1414    Last Pain:  Vitals:   12/28/21 1414  TempSrc: Tympanic  PainSc: Asleep      Patients Stated Pain Goal: 1 (34/19/37 9024)  Complications: No notable events documented.

## 2021-12-29 NOTE — Telephone Encounter (Signed)
ATC pt with hospital f/u appt, voice mailbox was full

## 2021-12-29 NOTE — Anesthesia Postprocedure Evaluation (Signed)
Anesthesia Post Note  Patient: Brendan Butler  Procedure(s) Performed: LAPAROSCOPIC CHOLECYSTECTOMY, LAPAROSCOPIC LYSIS OF ADHESIONS GREATER THAN 1 HOUR     Patient location during evaluation: PACU Anesthesia Type: General Level of consciousness: awake and alert Pain management: pain level controlled Vital Signs Assessment: post-procedure vital signs reviewed and stable Respiratory status: spontaneous breathing, nonlabored ventilation, respiratory function stable and patient connected to nasal cannula oxygen Cardiovascular status: blood pressure returned to baseline and stable Postop Assessment: no apparent nausea or vomiting Anesthetic complications: no   No notable events documented.  Last Vitals:  Vitals:   12/28/21 1434 12/28/21 1653  BP: (!) 117/49 (!) 157/67  Pulse: 79 75  Resp: 16 17  Temp: 36.5 C (!) 36.4 C  SpO2: 95% 97%    Last Pain:  Vitals:   12/28/21 1653  TempSrc: Oral  PainSc:                  [place]

## 2021-12-30 LAB — CULTURE, BAL-QUANTITATIVE W GRAM STAIN
Culture: NO GROWTH
Gram Stain: NONE SEEN

## 2021-12-31 ENCOUNTER — Encounter (HOSPITAL_COMMUNITY): Payer: Self-pay | Admitting: Emergency Medicine

## 2021-12-31 LAB — ACID FAST SMEAR (AFB, MYCOBACTERIA): Acid Fast Smear: NEGATIVE

## 2022-01-01 ENCOUNTER — Telehealth: Payer: Self-pay | Admitting: Emergency Medicine

## 2022-01-01 LAB — CYTOLOGY - NON PAP

## 2022-01-01 NOTE — Telephone Encounter (Signed)
Called patient to discuss bronchoscopy results.  His transbronchial biopsies did not show any evidence of malignancy, did show pulmonary macrophages.  Left a message on VM and will call him back.  He will need serial CT scan imaging to ensure no interval change that increases our suspicion for malignancy.

## 2022-01-01 NOTE — Telephone Encounter (Signed)
ATC x2.  LVM to return call.  Needs to be schedule for a hospital follow up, 30 minute slot, first available with RB when he returns call.

## 2022-01-01 NOTE — Telephone Encounter (Signed)
Closing encounter since we have tried 2x to reach patient in regards to message.

## 2022-01-02 LAB — AEROBIC/ANAEROBIC CULTURE W GRAM STAIN (SURGICAL/DEEP WOUND): Culture: NO GROWTH

## 2022-01-03 DIAGNOSIS — I10 Essential (primary) hypertension: Secondary | ICD-10-CM | POA: Diagnosis not present

## 2022-01-03 DIAGNOSIS — N179 Acute kidney failure, unspecified: Secondary | ICD-10-CM | POA: Diagnosis not present

## 2022-01-03 DIAGNOSIS — I2510 Atherosclerotic heart disease of native coronary artery without angina pectoris: Secondary | ICD-10-CM | POA: Diagnosis not present

## 2022-01-03 DIAGNOSIS — I700 Atherosclerosis of aorta: Secondary | ICD-10-CM | POA: Diagnosis not present

## 2022-01-03 DIAGNOSIS — N1831 Chronic kidney disease, stage 3a: Secondary | ICD-10-CM | POA: Diagnosis not present

## 2022-01-03 DIAGNOSIS — R918 Other nonspecific abnormal finding of lung field: Secondary | ICD-10-CM | POA: Diagnosis not present

## 2022-01-03 DIAGNOSIS — J479 Bronchiectasis, uncomplicated: Secondary | ICD-10-CM | POA: Diagnosis not present

## 2022-01-03 DIAGNOSIS — Z9049 Acquired absence of other specified parts of digestive tract: Secondary | ICD-10-CM | POA: Diagnosis not present

## 2022-01-03 NOTE — Telephone Encounter (Signed)
I reviewed the results with the patient.  Cytology negative, cultures pending.  He has a follow-up visit with me in mid December.  At that time we will decide when to perform his surveillance imaging.

## 2022-02-01 LAB — FUNGAL ORGANISM REFLEX

## 2022-02-01 LAB — FUNGUS CULTURE WITH STAIN

## 2022-02-01 LAB — FUNGUS CULTURE RESULT

## 2022-02-08 ENCOUNTER — Inpatient Hospital Stay: Payer: Medicare HMO | Admitting: Emergency Medicine

## 2022-02-11 LAB — ACID FAST CULTURE WITH REFLEXED SENSITIVITIES (MYCOBACTERIA): Acid Fast Culture: NEGATIVE

## 2022-03-22 ENCOUNTER — Inpatient Hospital Stay: Payer: Medicare HMO | Admitting: Emergency Medicine

## 2022-03-22 ENCOUNTER — Other Ambulatory Visit: Payer: Self-pay

## 2022-03-22 DIAGNOSIS — R911 Solitary pulmonary nodule: Secondary | ICD-10-CM

## 2022-04-04 ENCOUNTER — Ambulatory Visit (HOSPITAL_COMMUNITY)
Admission: RE | Admit: 2022-04-04 | Discharge: 2022-04-04 | Disposition: A | Discharge status: Home / Self Care | Payer: Medicare HMO | Source: Ambulatory Visit | Attending: Emergency Medicine | Admitting: Emergency Medicine

## 2022-04-04 DIAGNOSIS — R911 Solitary pulmonary nodule: Secondary | ICD-10-CM | POA: Diagnosis not present

## 2022-04-04 DIAGNOSIS — J439 Emphysema, unspecified: Secondary | ICD-10-CM | POA: Diagnosis not present

## 2022-04-06 ENCOUNTER — Ambulatory Visit (INDEPENDENT_AMBULATORY_CARE_PROVIDER_SITE_OTHER): Payer: Medicare HMO | Admitting: Emergency Medicine

## 2022-04-06 ENCOUNTER — Encounter: Payer: Self-pay | Admitting: Emergency Medicine

## 2022-04-06 VITALS — BP 134/72 | HR 88 | Temp 97.9°F | Ht 69.0 in | Wt 222.8 lb

## 2022-04-06 DIAGNOSIS — R918 Other nonspecific abnormal finding of lung field: Secondary | ICD-10-CM | POA: Diagnosis not present

## 2022-04-06 NOTE — Addendum Note (Signed)
Addended by: Gavin Potters R on: 04/06/2022 11:07 AM   Modules accepted: Orders

## 2022-04-06 NOTE — Progress Notes (Signed)
Subjective:    Patient ID: Brendan Butler, male    DOB: [birth year], 72 y.o.   MRN: [id number]  HPI 72 year old former smoker (60 pack years) with extensive cardiac history including CAD/stent, history of VF arrest, systolic CHF, hypertension.  Also with OSA.  He has been seen in the past for right lower lobe pulmonary nodular disease but never followed up.  I met him when he was admitted to the hospital in late October 2023.  There was some concern about an increase in size in his right lower lobe nodular opacity with some cavitation but a more prominent solid component.  He underwent navigational bronchoscopy 12/28/2021 and cytologies were negative.  Cellblock showed pulmonary macrophages and acute inflammatory cells.  The fungal and AFB cultures were negative as well.  He follows up today after repeat CT scan as below. He reports that he has been doing quite well. Occasionally has some imbalance. Not on any BD therapy.   CT scan of the chest done 04/04/2022 reviewed by me shows the mixed density cystic solid nodule in the right lower lobe now measures 3.3 x 1.7 cm, larger than 12/25/2021.  A more inferior right lower lobe peripheral groundglass nodule appears to be stable in size.  10 mm short axis subcarinal lymph node   Review of Systems As per HPI  Past Medical History:  Diagnosis Date   Acute myocardial infarction of other anterior wall, initial episode of care 12/29/2012   Promus stent to LAD   Acute ST segment elevation MI ([place]) 01/05/2013   secondary to thrombus in stent; Brilintta changed to Effient pt with ASA allergy   Coronary artery disease 2001   stent to LAD and patent 2007 on cath   Echocardiogram abnormal 01/06/2013   EF Q000111Q, grade I diastolic dysfunction   Erectile dysfunction    Hyperlipidemia LDL goal <70    Hypertension    OSA (obstructive sleep apnea)    No CPAP use overnight     Family History  Problem Relation Age of Onset   Alzheimer's disease Mother     CAD Father      Social History   Socioeconomic History   Marital status: Divorced    Spouse name: Not on file   Number of children: Not on file   Years of education: Not on file   Highest education level: Not on file  Occupational History   Not on file  Tobacco Use   Smoking status: Former    Packs/day: 2.00    Years: 25.00    Total pack years: 50.00    Types: Cigarettes    Quit date: 2001    Years since quitting: 23.1   Smokeless tobacco: Current    Types: Chew  Vaping Use   Vaping Use: Never used  Substance and Sexual Activity   Alcohol use: Not Currently    Comment: 2001 stopped drinking   Drug use: No   Sexual activity: Not on file  Other Topics Concern   Not on file  Social History Narrative   Not on file   Social Determinants of Health   Financial Resource Strain: Not on file  Food Insecurity: Not on file  Transportation Needs: Not on file  Physical Activity: Not on file  Stress: Not on file  Social Connections: Not on file  Intimate Partner Violence: Not on file    Hx of welding, painting. ? Asbestos exposure No military [place] native   Allergies  Allergen Reactions  Levitra [Vardenafil] Other (See Comments)    Blurred vision   Aspirin Hives   Clopidogrel Other (See Comments)    Not effective anti platelet med for patient-switched to effient   Lipitor [Atorvastatin] Other (See Comments)    Muscle cramps; patient states he does not take medication at home     Outpatient Medications Prior to Visit  Medication Sig Dispense Refill   buPROPion (WELLBUTRIN XL) 150 MG 24 hr tablet Take 450 mg by mouth daily.     fluticasone (FLONASE) 50 MCG/ACT nasal spray Place 1 spray into both nostrils daily.     furosemide (LASIX) 20 MG tablet Take 1 tablet (20 mg total) by mouth daily. 30 tablet 5   nitroGLYCERIN (NITROSTAT) 0.4 MG SL tablet Place 1 tablet (0.4 mg total) under the tongue every 5 (five) minutes x 3 doses as needed for chest pain. NEED OV. 25 tablet 0    PARoxetine (PAXIL) 30 MG tablet Take 30 mg by mouth daily.     potassium chloride (KLOR-CON M) 10 MEQ tablet Take 10 mEq by mouth daily.     prasugrel (EFFIENT) 10 MG TABS tablet Take 1 tablet (10 mg total) by mouth daily. 30 tablet 11   pregabalin (LYRICA) 150 MG capsule Take 450 mg by mouth daily.     rosuvastatin (CRESTOR) 10 MG tablet Take 10 mg by mouth daily.     No facility-administered medications prior to visit.         Objective:   Physical Exam Vitals:   04/06/22 1034  BP: 134/72  Pulse: 88  Temp: 97.9 F (36.6 C)  TempSrc: Oral  SpO2: 97%  Weight: 222 lb 12.8 oz (101.1 kg)  Height: 5' 9"$  (1.753 m)   Gen: Pleasant, overwt man, in no distress,  normal affect  ENT: No lesions,  mouth clear,  oropharynx clear, poor dentition, no postnasal drip  Neck: No JVD, no stridor  Lungs: No use of accessory muscles, no crackles or wheezing on normal respiration, no wheeze on forced expiration  Cardiovascular: RRR, heart sounds normal, no murmur or gallops, no peripheral edema  Musculoskeletal: No deformities, no cyanosis or clubbing  Neuro: alert, awake, non focal  Skin: Warm, no lesions or rash       Assessment & Plan:  Pulmonary nodules Pulmonary nodular disease with cavitary component in the right lower lobe as well as a subtle stable groundglass nodule.  More prominent than on his prior CT.  Bronchoscopy 12/28/2021 was reassuring.  We discussed the options for evaluation including serial imaging, repeat bronchoscopy, referral for possible resection.  We decided to perform a PET scan to further risk stratify.  If high suspicion remains then we will repeat a navigational bronchoscopy.  If the right lower lobe areas are receding or are not hypermetabolic then we will likely plan to repeat serial imaging, CT chest in 3 months.  We will plan to perform a PET scan next available. If the PET scan shows evidence that your right lower lobe pulmonary nodules are active,  suspicious for inflammation or tumor, then we will arrange for a repeat bronchoscopy to investigate this area. If your PET scan is not suspicious for an active process then we will plan to repeat your CT scan of the chest in about 3 months. We will discuss the results by phone after the scan is done to plan next steps in the evaluation We will probably perform pulmonary function testing at some point in the future Follow Dr. Lamonte Sakai next  available after the PET scan.   Baltazar Apo, MD, PhD 04/06/2022, 10:58 AM [facility] [phone] or if no answer before 7:00PM call [phone] For any issues after 7:00PM please call eLink [phone]

## 2022-04-06 NOTE — Assessment & Plan Note (Signed)
Pulmonary nodular disease with cavitary component in the right lower lobe as well as a subtle stable groundglass nodule.  More prominent than on his prior CT.  Bronchoscopy 12/28/2021 was reassuring.  We discussed the options for evaluation including serial imaging, repeat bronchoscopy, referral for possible resection.  We decided to perform a PET scan to further risk stratify.  If high suspicion remains then we will repeat a navigational bronchoscopy.  If the right lower lobe areas are receding or are not hypermetabolic then we will likely plan to repeat serial imaging, CT chest in 3 months.  We will plan to perform a PET scan next available. If the PET scan shows evidence that your right lower lobe pulmonary nodules are active, suspicious for inflammation or tumor, then we will arrange for a repeat bronchoscopy to investigate this area. If your PET scan is not suspicious for an active process then we will plan to repeat your CT scan of the chest in about 3 months. We will discuss the results by phone after the scan is done to plan next steps in the evaluation We will probably perform pulmonary function testing at some point in the future Follow Dr. Lamonte Sakai next available after the PET scan.

## 2022-04-06 NOTE — Patient Instructions (Signed)
We will plan to perform a PET scan next available. If the PET scan shows evidence that your right lower lobe pulmonary nodules are active, suspicious for inflammation or tumor, then we will arrange for a repeat bronchoscopy to investigate this area. If your PET scan is not suspicious for an active process then we will plan to repeat your CT scan of the chest in about 3 months. We will discuss the results by phone after the scan is done to plan next steps in the evaluation We will probably perform pulmonary function testing at some point in the future Follow Dr. Lamonte Sakai next available after the PET scan.

## 2022-04-11 ENCOUNTER — Ambulatory Visit (HOSPITAL_COMMUNITY)
Admission: RE | Admit: 2022-04-11 | Discharge: 2022-04-11 | Disposition: A | Discharge status: Home / Self Care | Payer: Medicare HMO | Source: Ambulatory Visit | Attending: Emergency Medicine | Admitting: Emergency Medicine

## 2022-04-11 DIAGNOSIS — R918 Other nonspecific abnormal finding of lung field: Secondary | ICD-10-CM | POA: Diagnosis not present

## 2022-04-11 LAB — GLUCOSE, CAPILLARY: Glucose-Capillary: 98 mg/dL (ref 70–99)

## 2022-04-11 MED ORDER — FLUDEOXYGLUCOSE F - 18 (FDG) INJECTION
11.1000 | Freq: Once | INTRAVENOUS | Status: AC
  Administered 2022-04-11: 11.1 via INTRAVENOUS

## 2022-05-06 ENCOUNTER — Emergency Department (HOSPITAL_COMMUNITY): Payer: Medicare HMO

## 2022-05-06 ENCOUNTER — Encounter (HOSPITAL_COMMUNITY): Payer: Self-pay

## 2022-05-06 ENCOUNTER — Other Ambulatory Visit: Payer: Self-pay

## 2022-05-06 ENCOUNTER — Inpatient Hospital Stay (HOSPITAL_COMMUNITY)
Admission: EM | Admit: 2022-05-06 | Discharge: 2022-05-10 | DRG: 309 | Disposition: A | Discharge status: Home / Self Care | Payer: Medicare HMO | Attending: Internal Medicine | Admitting: Internal Medicine

## 2022-05-06 DIAGNOSIS — I340 Nonrheumatic mitral (valve) insufficiency: Secondary | ICD-10-CM | POA: Diagnosis not present

## 2022-05-06 DIAGNOSIS — F1011 Alcohol abuse, in remission: Secondary | ICD-10-CM | POA: Diagnosis present

## 2022-05-06 DIAGNOSIS — I10 Essential (primary) hypertension: Secondary | ICD-10-CM | POA: Diagnosis present

## 2022-05-06 DIAGNOSIS — R4701 Aphasia: Secondary | ICD-10-CM | POA: Diagnosis present

## 2022-05-06 DIAGNOSIS — R0602 Shortness of breath: Secondary | ICD-10-CM | POA: Diagnosis not present

## 2022-05-06 DIAGNOSIS — G4700 Insomnia, unspecified: Secondary | ICD-10-CM | POA: Diagnosis present

## 2022-05-06 DIAGNOSIS — Z87891 Personal history of nicotine dependence: Secondary | ICD-10-CM | POA: Diagnosis not present

## 2022-05-06 DIAGNOSIS — F330 Major depressive disorder, recurrent, mild: Secondary | ICD-10-CM | POA: Insufficient documentation

## 2022-05-06 DIAGNOSIS — E876 Hypokalemia: Secondary | ICD-10-CM | POA: Diagnosis present

## 2022-05-06 DIAGNOSIS — I4891 Unspecified atrial fibrillation: Principal | ICD-10-CM | POA: Diagnosis present

## 2022-05-06 DIAGNOSIS — I2510 Atherosclerotic heart disease of native coronary artery without angina pectoris: Secondary | ICD-10-CM | POA: Diagnosis not present

## 2022-05-06 DIAGNOSIS — M1990 Unspecified osteoarthritis, unspecified site: Secondary | ICD-10-CM | POA: Diagnosis present

## 2022-05-06 DIAGNOSIS — F05 Delirium due to known physiological condition: Secondary | ICD-10-CM | POA: Clinically undetermined

## 2022-05-06 DIAGNOSIS — Z7902 Long term (current) use of antithrombotics/antiplatelets: Secondary | ICD-10-CM

## 2022-05-06 DIAGNOSIS — Z888 Allergy status to other drugs, medicaments and biological substances status: Secondary | ICD-10-CM

## 2022-05-06 DIAGNOSIS — I5042 Chronic combined systolic (congestive) and diastolic (congestive) heart failure: Secondary | ICD-10-CM | POA: Diagnosis present

## 2022-05-06 DIAGNOSIS — Z79899 Other long term (current) drug therapy: Secondary | ICD-10-CM

## 2022-05-06 DIAGNOSIS — I252 Old myocardial infarction: Secondary | ICD-10-CM

## 2022-05-06 DIAGNOSIS — D72829 Elevated white blood cell count, unspecified: Secondary | ICD-10-CM | POA: Diagnosis present

## 2022-05-06 DIAGNOSIS — R519 Headache, unspecified: Secondary | ICD-10-CM | POA: Diagnosis not present

## 2022-05-06 DIAGNOSIS — R911 Solitary pulmonary nodule: Secondary | ICD-10-CM | POA: Diagnosis present

## 2022-05-06 DIAGNOSIS — R4182 Altered mental status, unspecified: Secondary | ICD-10-CM | POA: Diagnosis present

## 2022-05-06 DIAGNOSIS — N1832 Chronic kidney disease, stage 3b: Secondary | ICD-10-CM | POA: Diagnosis present

## 2022-05-06 DIAGNOSIS — I4901 Ventricular fibrillation: Secondary | ICD-10-CM | POA: Diagnosis not present

## 2022-05-06 DIAGNOSIS — I255 Ischemic cardiomyopathy: Secondary | ICD-10-CM | POA: Diagnosis present

## 2022-05-06 DIAGNOSIS — G4733 Obstructive sleep apnea (adult) (pediatric): Secondary | ICD-10-CM | POA: Diagnosis not present

## 2022-05-06 DIAGNOSIS — I440 Atrioventricular block, first degree: Secondary | ICD-10-CM | POA: Diagnosis present

## 2022-05-06 DIAGNOSIS — I130 Hypertensive heart and chronic kidney disease with heart failure and stage 1 through stage 4 chronic kidney disease, or unspecified chronic kidney disease: Secondary | ICD-10-CM | POA: Diagnosis present

## 2022-05-06 DIAGNOSIS — I483 Typical atrial flutter: Secondary | ICD-10-CM | POA: Diagnosis present

## 2022-05-06 DIAGNOSIS — Z8249 Family history of ischemic heart disease and other diseases of the circulatory system: Secondary | ICD-10-CM

## 2022-05-06 DIAGNOSIS — I4892 Unspecified atrial flutter: Secondary | ICD-10-CM | POA: Diagnosis present

## 2022-05-06 DIAGNOSIS — Z886 Allergy status to analgesic agent status: Secondary | ICD-10-CM

## 2022-05-06 DIAGNOSIS — N1831 Chronic kidney disease, stage 3a: Secondary | ICD-10-CM | POA: Diagnosis not present

## 2022-05-06 DIAGNOSIS — Z7901 Long term (current) use of anticoagulants: Secondary | ICD-10-CM

## 2022-05-06 DIAGNOSIS — E785 Hyperlipidemia, unspecified: Secondary | ICD-10-CM | POA: Diagnosis not present

## 2022-05-06 DIAGNOSIS — Z820 Family history of epilepsy and other diseases of the nervous system: Secondary | ICD-10-CM

## 2022-05-06 DIAGNOSIS — F419 Anxiety disorder, unspecified: Secondary | ICD-10-CM | POA: Diagnosis present

## 2022-05-06 DIAGNOSIS — Z955 Presence of coronary angioplasty implant and graft: Secondary | ICD-10-CM

## 2022-05-06 DIAGNOSIS — I6381 Other cerebral infarction due to occlusion or stenosis of small artery: Secondary | ICD-10-CM | POA: Diagnosis not present

## 2022-05-06 DIAGNOSIS — J479 Bronchiectasis, uncomplicated: Secondary | ICD-10-CM

## 2022-05-06 DIAGNOSIS — F22 Delusional disorders: Secondary | ICD-10-CM | POA: Diagnosis not present

## 2022-05-06 DIAGNOSIS — Z905 Acquired absence of kidney: Secondary | ICD-10-CM

## 2022-05-06 DIAGNOSIS — R4781 Slurred speech: Secondary | ICD-10-CM | POA: Diagnosis not present

## 2022-05-06 DIAGNOSIS — N179 Acute kidney failure, unspecified: Secondary | ICD-10-CM | POA: Diagnosis not present

## 2022-05-06 HISTORY — DX: Altered mental status, unspecified: R41.82

## 2022-05-06 HISTORY — DX: Unspecified atrial flutter: I48.92

## 2022-05-06 LAB — BASIC METABOLIC PANEL
Anion gap: 12 (ref 5–15)
BUN: 25 mg/dL — ABNORMAL HIGH (ref 8–23)
CO2: 25 mmol/L (ref 22–32)
Calcium: 8.8 mg/dL — ABNORMAL LOW (ref 8.9–10.3)
Chloride: 101 mmol/L (ref 98–111)
Creatinine, Ser: 1.98 mg/dL — ABNORMAL HIGH (ref 0.61–1.24)
GFR, Estimated: 36 mL/min — ABNORMAL LOW (ref >60)
Glucose, Bld: 118 mg/dL — ABNORMAL HIGH (ref 70–99)
Potassium: 2.6 mmol/L — CL (ref 3.5–5.1)
Sodium: 138 mmol/L (ref 135–145)

## 2022-05-06 LAB — CBC WITH DIFFERENTIAL/PLATELET
Abs Immature Granulocytes: 0.06 10*3/uL (ref 0.00–0.07)
Basophils Absolute: 0 10*3/uL (ref 0.0–0.1)
Basophils Relative: 0 %
Eosinophils Absolute: 0.1 10*3/uL (ref 0.0–0.5)
Eosinophils Relative: 1 %
HCT: 45.6 % (ref 39.0–52.0)
Hemoglobin: 15.8 g/dL (ref 13.0–17.0)
Immature Granulocytes: 0 %
Lymphocytes Relative: 21 %
Lymphs Abs: 3.7 10*3/uL (ref 0.7–4.0)
MCH: 28.9 pg (ref 26.0–34.0)
MCHC: 34.6 g/dL (ref 30.0–36.0)
MCV: 83.4 fL (ref 80.0–100.0)
Monocytes Absolute: 1.8 10*3/uL — ABNORMAL HIGH (ref 0.1–1.0)
Monocytes Relative: 10 %
Neutro Abs: 11.8 10*3/uL — ABNORMAL HIGH (ref 1.7–7.7)
Neutrophils Relative %: 68 %
Platelets: 256 10*3/uL (ref 150–400)
RBC: 5.47 MIL/uL (ref 4.22–5.81)
RDW: 12.7 % (ref 11.5–15.5)
WBC: 17.5 10*3/uL — ABNORMAL HIGH (ref 4.0–10.5)
nRBC: 0 % (ref 0.0–0.2)

## 2022-05-06 LAB — CREATININE, SERUM
Creatinine, Ser: 1.91 mg/dL — ABNORMAL HIGH (ref 0.61–1.24)
GFR, Estimated: 37 mL/min — ABNORMAL LOW (ref >60)

## 2022-05-06 LAB — CBC
HCT: 44 % (ref 39.0–52.0)
Hemoglobin: 15.2 g/dL (ref 13.0–17.0)
MCH: 29.4 pg (ref 26.0–34.0)
MCHC: 34.5 g/dL (ref 30.0–36.0)
MCV: 85.1 fL (ref 80.0–100.0)
Platelets: 257 10*3/uL (ref 150–400)
RBC: 5.17 MIL/uL (ref 4.22–5.81)
RDW: 12.7 % (ref 11.5–15.5)
WBC: 19.5 10*3/uL — ABNORMAL HIGH (ref 4.0–10.5)
nRBC: 0 % (ref 0.0–0.2)

## 2022-05-06 LAB — MAGNESIUM: Magnesium: 2.1 mg/dL (ref 1.7–2.4)

## 2022-05-06 LAB — HIV ANTIBODY (ROUTINE TESTING W REFLEX): HIV Screen 4th Generation wRfx: NONREACTIVE

## 2022-05-06 LAB — TROPONIN I (HIGH SENSITIVITY)
Troponin I (High Sensitivity): 28 ng/L — ABNORMAL HIGH (ref <18)
Troponin I (High Sensitivity): 28 ng/L — ABNORMAL HIGH (ref <18)

## 2022-05-06 LAB — TSH: TSH: 1.856 u[IU]/mL (ref 0.350–4.500)

## 2022-05-06 LAB — BRAIN NATRIURETIC PEPTIDE: B Natriuretic Peptide: 36.5 pg/mL (ref 0.0–100.0)

## 2022-05-06 LAB — T4, FREE: Free T4: 1.19 ng/dL — ABNORMAL HIGH (ref 0.61–1.12)

## 2022-05-06 MED ORDER — ENOXAPARIN SODIUM 40 MG/0.4ML IJ SOSY
40.0000 mg | PREFILLED_SYRINGE | INTRAMUSCULAR | Status: DC
  Administered 2022-05-06: 40 mg via SUBCUTANEOUS
  Filled 2022-05-06: qty 0.4

## 2022-05-06 MED ORDER — PAROXETINE HCL 30 MG PO TABS
30.0000 mg | ORAL_TABLET | Freq: Every day | ORAL | Status: DC
  Administered 2022-05-07 – 2022-05-10 (×4): 30 mg via ORAL
  Filled 2022-05-06 (×4): qty 1

## 2022-05-06 MED ORDER — BUPROPION HCL ER (XL) 150 MG PO TB24
450.0000 mg | ORAL_TABLET | Freq: Every day | ORAL | Status: DC
  Administered 2022-05-07: 450 mg via ORAL
  Filled 2022-05-06: qty 3

## 2022-05-06 MED ORDER — ACETAMINOPHEN 325 MG PO TABS
650.0000 mg | ORAL_TABLET | Freq: Four times a day (QID) | ORAL | Status: DC | PRN
  Administered 2022-05-08 – 2022-05-09 (×3): 650 mg via ORAL
  Filled 2022-05-06 (×3): qty 2

## 2022-05-06 MED ORDER — ONDANSETRON HCL 4 MG PO TABS: 4.0000 mg | ORAL_TABLET | Freq: Four times a day (QID) | ORAL | Status: DC | PRN

## 2022-05-06 MED ORDER — ACETAMINOPHEN 650 MG RE SUPP: 650.0000 mg | Freq: Four times a day (QID) | RECTAL | Status: DC | PRN

## 2022-05-06 MED ORDER — SENNOSIDES-DOCUSATE SODIUM 8.6-50 MG PO TABS: 1.0000 | ORAL_TABLET | Freq: Every evening | ORAL | Status: DC | PRN

## 2022-05-06 MED ORDER — ONDANSETRON HCL 4 MG/2ML IJ SOLN: 4.0000 mg | Freq: Four times a day (QID) | INTRAMUSCULAR | Status: DC | PRN

## 2022-05-06 MED ORDER — DILTIAZEM LOAD VIA INFUSION
10.0000 mg | Freq: Once | INTRAVENOUS | Status: AC
  Administered 2022-05-06: 10 mg via INTRAVENOUS
  Filled 2022-05-06: qty 10

## 2022-05-06 MED ORDER — POTASSIUM CHLORIDE 10 MEQ/100ML IV SOLN
10.0000 meq | INTRAVENOUS | Status: AC
  Administered 2022-05-06 (×3): 10 meq via INTRAVENOUS
  Filled 2022-05-06 (×3): qty 100

## 2022-05-06 MED ORDER — DILTIAZEM HCL-DEXTROSE 125-5 MG/125ML-% IV SOLN (PREMIX)
5.0000 mg/h | INTRAVENOUS | Status: DC
  Administered 2022-05-06: 5 mg/h via INTRAVENOUS
  Administered 2022-05-07 (×2): 15 mg/h via INTRAVENOUS
  Administered 2022-05-07: 5 mg/h via INTRAVENOUS
  Administered 2022-05-08: 10 mg/h via INTRAVENOUS
  Administered 2022-05-08 – 2022-05-09 (×2): 5 mg/h via INTRAVENOUS
  Filled 2022-05-06 (×6): qty 125

## 2022-05-06 NOTE — ED Notes (Signed)
EDP aware of Critical Potassium 2.4.

## 2022-05-06 NOTE — ED Triage Notes (Signed)
Pt daughter states that patient has had behavior changed and confusing. Pt was forgetting words and not sleeping well. The change started last Monday.   The daughter said confusion has been the same all week and not getting any worse. Pt lives at home by himself.

## 2022-05-06 NOTE — ED Notes (Signed)
Patient transported to MRI 

## 2022-05-06 NOTE — H&P (Addendum)
PCP:   Wenda Low, MD   Chief Complaint:  Abnormal behaviors  HPI: This is a 72 year old male with past medical history of HTN, HLD, CAD, CHF, OSA not on CPAP.  Per patient, he was sent to the ER because his daughter made him come.  She believes he has been doing a lot of out of ordinary things which to him is just living.  Patient is a meager historian  Per EDP report patient has been acting differently approximately a week.  He has had intermittent episodes of slurred speech and word finding difficulty.  He has been staying awake at nights and calling family members in the nighttime with delusional thoughts and beliefs.  For example he believes a family member was rubbing feces across his walls.  In the ER patient not delusional.  Patient found to be in atrial flutter heart rate 140s.  Cardizem drip initiated.  CT head without acute changes.  Chart review shows no prior A-flutter.  Patient not on anticoagulants  Review of Systems:  The patient denies anorexia, fever, weight loss,, vision loss, decreased hearing, hoarseness, chest pain, syncope, dyspnea on exertion, peripheral edema, balance deficits, hemoptysis, abdominal pain, melena, hematochezia, severe indigestion/heartburn, hematuria, incontinence, genital sores, muscle weakness, suspicious skin lesions, transient blindness, difficulty walking, depression, unusual weight change, abnormal bleeding, enlarged lymph nodes, angioedema, and breast masses. Positives: Insomnia, delusional behavior, slurred speech, word finding difficulty  Past Medical History: Past Medical History:  Diagnosis Date   Acute myocardial infarction of other anterior wall, initial episode of care 12/29/2012   Promus stent to LAD   Acute ST segment elevation MI ([place]) 01/05/2013   secondary to thrombus in stent; Brilintta changed to Effient pt with ASA allergy   Coronary artery disease 2001   stent to LAD and patent 2007 on cath   Echocardiogram abnormal  01/06/2013   EF [id number], grade I diastolic dysfunction   Erectile dysfunction    Hyperlipidemia LDL goal <70    Hypertension    OSA (obstructive sleep apnea)    No CPAP use overnight   Past Surgical History:  Procedure Laterality Date   BACK SURGERY  2007   Decompression lumbar laminectomy and micro discectomy, L4-5   BRONCHIAL BIOPSY  12/28/2021   Procedure: BRONCHIAL BIOPSIES;  Surgeon: Collene Gobble, MD;  Location: [facility];  Service: Pulmonary;;   BRONCHIAL BRUSHINGS  12/28/2021   Procedure: BRONCHIAL BRUSHINGS;  Surgeon: Collene Gobble, MD;  Location: [facility];  Service: Pulmonary;;   BRONCHIAL NEEDLE ASPIRATION BIOPSY  12/28/2021   Procedure: BRONCHIAL NEEDLE ASPIRATION BIOPSIES;  Surgeon: Collene Gobble, MD;  Location: [facility];  Service: Pulmonary;;   BRONCHIAL WASHINGS  12/28/2021   Procedure: BRONCHIAL WASHINGS;  Surgeon: Collene Gobble, MD;  Location: [facility];  Service: Pulmonary;;   CARDIAC CATHETERIZATION  2007   patent stent to LAD and normal [place]   CHOLECYSTECTOMY N/A 12/26/2021   Procedure: LAPAROSCOPIC CHOLECYSTECTOMY, LAPAROSCOPIC LYSIS OF ADHESIONS GREATER THAN 1 HOUR;  Surgeon: [name], MD;  Location: [facility];  Service: General;  Laterality: N/A;   CORONARY ANGIOPLASTY WITH STENT PLACEMENT  2001   to LAD   CORONARY ANGIOPLASTY WITH STENT PLACEMENT  12/29/12   STEMI with promus DES to LAD   CORONARY ANGIOPLASTY WITH STENT PLACEMENT  01/05/13   STEMI with thrombosis in stent to LAD   FIDUCIAL MARKER PLACEMENT  12/28/2021   Procedure: FIDUCIAL MARKER PLACEMENT;  Surgeon: Collene Gobble, MD;  Location: [facility];  Service:  Pulmonary;;   LEFT HEART CATH N/A 12/29/2012   Procedure: LEFT HEART CATH;  Surgeon: Jettie Booze, MD;  Location: [facility] CATH LAB;  Service: Cardiovascular;  Laterality: N/A;   LEFT HEART CATHETERIZATION WITH CORONARY ANGIOGRAM N/A 01/05/2013   Procedure: LEFT HEART CATHETERIZATION WITH CORONARY ANGIOGRAM;  Surgeon:  Blane Ohara, MD;  Location: [facility] CATH LAB;  Service: Cardiovascular;  Laterality: N/A;   LEFT HEART CATHETERIZATION WITH CORONARY ANGIOGRAM N/A 04/22/2013   Procedure: LEFT HEART CATHETERIZATION WITH CORONARY ANGIOGRAM;  Surgeon: Troy Sine, MD;  Location: [facility] CATH LAB;  Service: Cardiovascular;  Laterality: N/A;   PARTIAL NEPHRECTOMY Left 1975   PATIENT ONLY HAS ONE KIDNEY   PERCUTANEOUS CORONARY STENT INTERVENTION (PCI-S)  12/29/2012   Procedure: PERCUTANEOUS CORONARY STENT INTERVENTION (PCI-S);  Surgeon: Jettie Booze, MD;  Location: [facility] CATH LAB;  Service: Cardiovascular;;    Medications: Prior to Admission medications   Medication Sig Start Date End Date Taking? Authorizing Provider  buPROPion (WELLBUTRIN XL) 150 MG 24 hr tablet Take 450 mg by mouth daily.    [provider]  fluticasone (FLONASE) 50 MCG/ACT nasal spray Place 1 spray into both nostrils daily. 12/24/21   [provider]  furosemide (LASIX) 20 MG tablet Take 1 tablet (20 mg total) by mouth daily. 01/02/13   Lyda Jester M, PA-C  nitroGLYCERIN (NITROSTAT) 0.4 MG SL tablet Place 1 tablet (0.4 mg total) under the tongue every 5 (five) minutes x 3 doses as needed for chest pain. NEED OV. 05/09/15   Lorretta Harp, MD  PARoxetine (PAXIL) 30 MG tablet Take 30 mg by mouth daily.    [provider]  potassium chloride (KLOR-CON M) 10 MEQ tablet Take 10 mEq by mouth daily. 12/07/21   [provider]  prasugrel (EFFIENT) 10 MG TABS tablet Take 1 tablet (10 mg total) by mouth daily. 12/29/21   Geradine Girt, DO  pregabalin (LYRICA) 150 MG capsule Take 450 mg by mouth daily. 11/20/21   [provider]  rosuvastatin (CRESTOR) 10 MG tablet Take 10 mg by mouth daily. 12/12/15   [provider]    Allergies:   Allergies  Allergen Reactions   Levitra [Vardenafil] Other (See Comments)    Blurred vision   Aspirin Hives   Clopidogrel Other (See Comments)    Not  effective anti platelet med for patient-switched to effient   Lipitor [Atorvastatin] Other (See Comments)    Muscle cramps; patient states he does not take medication at home    Social History:  reports that he quit smoking about 23 years ago. His smoking use included cigarettes. He has a 50.00 pack-year smoking history. His smokeless tobacco use includes chew. He reports that he does not currently use alcohol. He reports that he does not use drugs.  Family History: Family History  Problem Relation Age of Onset   Alzheimer's disease Mother    CAD Father     Physical Exam: Vitals:   05/06/22 1822 05/06/22 1824 05/06/22 1915  BP:  (!) 159/92 (!) 141/112  Pulse:  (!) 141 78  Resp:  15 19  Temp:  98.2 F (36.8 C)   TempSrc:  Oral   SpO2:  99% 99%  Weight: 102.1 kg    Height: '5\' 10"'$  (1.778 m)      General:  Alert and oriented times three, well developed and nourished, no acute distress Eyes: PERRLA, pink conjunctiva, no scleral icterus ENT: Moist oral mucosa, neck supple, no thyromegaly Lungs: clear  to ascultation, no wheeze, no crackles, no use of accessory muscles Cardiovascular: regular rate and rhythm, no regurgitation, no gallops, no murmurs. No carotid bruits, no JVD Abdomen: soft, positive BS, non-tender, non-distended, no organomegaly, not an acute abdomen GU: not examined Neuro: CN II - XII grossly intact, sensation intact Musculoskeletal: strength 5/5 all extremities, no clubbing, cyanosis or edema Skin: no rash, no subcutaneous crepitation, no decubitus Psych: Somewhat off?  Contained anger?   Somewhat off, almost angry  Labs on Admission:  Recent Labs    05/06/22 1850  NA 138  K 2.6*  CL 101  CO2 25  GLUCOSE 118*  BUN 25*  CREATININE 1.98*  CALCIUM 8.8*  MG 2.1    Recent Labs    05/06/22 1850  WBC 17.5*  NEUTROABS 11.8*  HGB 15.8  HCT 45.6  MCV 83.4  PLT 256   Recent Labs    05/06/22 1850  TSH 1.856     Radiological Exams on  Admission: CT Head Wo Contrast  Result Date: 05/06/2022 CLINICAL DATA:  Headache. EXAM: CT HEAD WITHOUT CONTRAST TECHNIQUE: Contiguous axial images were obtained from the base of the skull through the vertex without intravenous contrast. RADIATION DOSE REDUCTION: This exam was performed according to the departmental dose-optimization program which includes automated exposure control, adjustment of the mA and/or kV according to patient size and/or use of iterative reconstruction technique. COMPARISON:  Head CT dated 12/24/2021. FINDINGS: Brain: Mild age-related atrophy and chronic microvascular ischemic changes. Small old bilateral basal ganglia lacunar infarcts. There is no acute intracranial hemorrhage. No mass effect or midline shift. No extra-axial fluid collection. Vascular: No hyperdense vessel or unexpected calcification. Skull: Normal. Negative for fracture or focal lesion. Sinuses/Orbits: The visualized paranasal sinuses and the mastoid air cells are clear. Right mastoid effusions. No air-fluid level. Other: None IMPRESSION: 1. No acute intracranial pathology. 2. Mild age-related atrophy and chronic microvascular ischemic changes. Small old bilateral basal ganglia lacunar infarcts. Electronically Signed   By: Anner Crete M.D.   On: 05/06/2022 20:00   DG Chest Portable 1 View  Result Date: 05/06/2022 CLINICAL DATA:  Shortness of breath EXAM: PORTABLE CHEST 1 VIEW COMPARISON:  Chest x-ray 12/28/2021 FINDINGS: The heart size and mediastinal contours are within normal limits. Both lungs are clear. The visualized skeletal structures are unremarkable. There surgical clips in the upper abdomen. IMPRESSION: No active disease. Electronically Signed   By: Ronney Asters M.D.   On: 05/06/2022 19:43    Assessment/Plan Present on Admission:  Atrial flutter with rapid ventricular response ([place]) -Appears new.  Continue Cardizem drip titrate as needed -TSH, anticoagulations ordered -2D echo -Cardiology  consult placed   Hypokalemia -Repleting IV and p.o.  BMP in a.m.   Acute alteration in mental status -DDx: Insomnia, embolic CVA,, idiopathic, infection, behavioral/psych related -MRI in a.m. rule out embolic CVA, thought to be less likely.  CT head normal -Ambien ordered for sleep -Behavioral health consult in a.m. -UDS -TSH, ammonia, alcohol level, vitamin B12 and folate level, RPR, HIV, UA ordered -Addendum: UDS positive for THC, vitamin B12 levels low.  B12 shot ordered   Mood disorder.   -Resume bupropion and paroxetine -BH consult   Chronic leukocytosis -Elevated WBCs from 2012.  Patient follow-up with heme-onc -Doubt infection   Pulmonary nodules: Followed by pulmonology  Coronary artery disease/ H/o STEMIx2-resume Crestor, Effient  Dyslipidemia-Crestor resumed  Stage 3a chronic kidney disease (CKD) (HCC)-stable at baseline  Brendan Butler 05/06/2022, 8:24 PM

## 2022-05-06 NOTE — ED Provider Notes (Signed)
[facility] Provider Note   CSN: [id number] Arrival date & time: 05/06/22  1814     History  Chief Complaint  Patient presents with   Altered Mental Status    Brendan Butler is a 72 y.o. male.   Altered Mental Status    Patient with medical history of OSA not on CPAP, hypertension, hyperlipidemia, CAD, CHF presents to the emergency department due to altered mental status.  Patient daughter is primary historian the patient is also participating in history.  Patient about a week ago started acting differently.  He had an episode of expressive aphasia, he has had intermittent episodes since then where he has difficulty finishing thoughts.  Per daughter he has not been sleeping throughout the night, calling other family members having delusional thoughts she is having family members rubbing feces across his house and events that did not occur.  There is no psychiatric history.  He denies any weakness on either side, SI, HI.  No recent changes in his medication list.  Patient is unsure if he is on any history of atrial flutter or atrial fibrillation, he thinks he is on a blood thinner.  States he was on Plavix with a change to something else recently.  Patient is being followed by pulmonology due to abnormal nodules on CT PET scan.   Home Medications Prior to Admission medications   Medication Sig Start Date End Date Taking? Authorizing Provider  buPROPion (WELLBUTRIN XL) 150 MG 24 hr tablet Take 450 mg by mouth daily.    [provider]  fluticasone (FLONASE) 50 MCG/ACT nasal spray Place 1 spray into both nostrils daily. 12/24/21   [provider]  furosemide (LASIX) 20 MG tablet Take 1 tablet (20 mg total) by mouth daily. 01/02/13   Lyda Jester M, PA-C  nitroGLYCERIN (NITROSTAT) 0.4 MG SL tablet Place 1 tablet (0.4 mg total) under the tongue every 5 (five) minutes x 3 doses as needed for chest pain. NEED OV. 05/09/15    Lorretta Harp, MD  PARoxetine (PAXIL) 30 MG tablet Take 30 mg by mouth daily.    [provider]  potassium chloride (KLOR-CON M) 10 MEQ tablet Take 10 mEq by mouth daily. 12/07/21   [provider]  prasugrel (EFFIENT) 10 MG TABS tablet Take 1 tablet (10 mg total) by mouth daily. 12/29/21   Geradine Girt, DO  pregabalin (LYRICA) 150 MG capsule Take 450 mg by mouth daily. 11/20/21   [provider]  rosuvastatin (CRESTOR) 10 MG tablet Take 10 mg by mouth daily. 12/12/15   [provider]      Allergies    Levitra [vardenafil], Aspirin, Clopidogrel, and Lipitor [atorvastatin]    Review of Systems   Review of Systems  Physical Exam Updated Vital Signs BP (!) 141/112   Pulse 78   Temp 98.2 F (36.8 C) (Oral)   Resp 19   Ht '5\' 10"'$  (1.778 m)   Wt 102.1 kg   SpO2 99%   BMI 32.28 kg/m  Physical Exam Vitals and nursing note reviewed. Exam conducted with a chaperone present.  Constitutional:      Appearance: Normal appearance.  HENT:     Head: Normocephalic and atraumatic.     Mouth/Throat:     Mouth: Mucous membranes are dry.  Eyes:     General: No scleral icterus.       Right eye: No discharge.        Left eye:  No discharge.     Extraocular Movements: Extraocular movements intact.     Pupils: Pupils are equal, round, and reactive to light.  Cardiovascular:     Rate and Rhythm: Tachycardia present. Rhythm irregular.     Pulses: Normal pulses.     Heart sounds: Normal heart sounds.     No friction rub. No gallop.     Comments: Tachycardic in the 150s and atrial flutter on cardiac monitoring Pulmonary:     Effort: Pulmonary effort is normal. No respiratory distress.     Breath sounds: Normal breath sounds.  Abdominal:     General: Abdomen is flat. Bowel sounds are normal. There is no distension.     Palpations: Abdomen is soft.     Tenderness: There is no abdominal tenderness.  Skin:    General: Skin is warm and dry.      Coloration: Skin is not jaundiced.  Neurological:     Mental Status: He is alert. Mental status is at baseline.     Coordination: Coordination normal.     Comments: Oriented x 3.  Cranial nerves II to XII are grossly intact.  Upper and lower extremity strength is symmetric bilaterally.  Gait testing was deferred.     ED Results / Procedures / Treatments   Labs (all labs ordered are listed, but only abnormal results are displayed) Labs Reviewed  CBC WITH DIFFERENTIAL/PLATELET - Abnormal; Notable for the following components:      Result Value   WBC 17.5 (*)    Neutro Abs 11.8 (*)    Monocytes Absolute 1.8 (*)    All other components within normal limits  BASIC METABOLIC PANEL - Abnormal; Notable for the following components:   Potassium 2.6 (*)    Glucose, Bld 118 (*)    BUN 25 (*)    Creatinine, Ser 1.98 (*)    Calcium 8.8 (*)    GFR, Estimated 36 (*)    All other components within normal limits  T4, FREE - Abnormal; Notable for the following components:   Free T4 1.19 (*)    All other components within normal limits  TROPONIN I (HIGH SENSITIVITY) - Abnormal; Notable for the following components:   Troponin I (High Sensitivity) 28 (*)    All other components within normal limits  BRAIN NATRIURETIC PEPTIDE  TSH  MAGNESIUM    EKG EKG Interpretation  Date/Time:  Sunday May 06 2022 18:28:06 EDT Ventricular Rate:  141 PR Interval:    QRS Duration: 126 QT Interval:  308 QTC Calculation: 464 R Axis:   -7 Text Interpretation: Atrial flutter with varied AV block, Nonspecific intraventricular conduction delay Repol abnrm suggests ischemia, lateral leads new compared to 2023 ekg Confirmed by Varney Biles [id number]) on 05/06/2022 7:28:58 PM  Radiology CT Head Wo Contrast  Result Date: 05/06/2022 CLINICAL DATA:  Headache. EXAM: CT HEAD WITHOUT CONTRAST TECHNIQUE: Contiguous axial images were obtained from the base of the skull through the vertex without intravenous contrast.  RADIATION DOSE REDUCTION: This exam was performed according to the departmental dose-optimization program which includes automated exposure control, adjustment of the mA and/or kV according to patient size and/or use of iterative reconstruction technique. COMPARISON:  Head CT dated 12/24/2021. FINDINGS: Brain: Mild age-related atrophy and chronic microvascular ischemic changes. Small old bilateral basal ganglia lacunar infarcts. There is no acute intracranial hemorrhage. No mass effect or midline shift. No extra-axial fluid collection. Vascular: No hyperdense vessel or unexpected calcification. Skull: Normal. Negative for fracture or focal lesion. Sinuses/Orbits:  The visualized paranasal sinuses and the mastoid air cells are clear. Right mastoid effusions. No air-fluid level. Other: None IMPRESSION: 1. No acute intracranial pathology. 2. Mild age-related atrophy and chronic microvascular ischemic changes. Small old bilateral basal ganglia lacunar infarcts. Electronically Signed   By: Anner Crete M.D.   On: 05/06/2022 20:00   DG Chest Portable 1 View  Result Date: 05/06/2022 CLINICAL DATA:  Shortness of breath EXAM: PORTABLE CHEST 1 VIEW COMPARISON:  Chest x-ray 12/28/2021 FINDINGS: The heart size and mediastinal contours are within normal limits. Both lungs are clear. The visualized skeletal structures are unremarkable. There surgical clips in the upper abdomen. IMPRESSION: No active disease. Electronically Signed   By: Ronney Asters M.D.   On: 05/06/2022 19:43    Procedures .Critical Care  Performed by: Sherrill Raring, PA-C Authorized by: Sherrill Raring, PA-C   Critical care provider statement:    Critical care time (minutes):  30   Critical care start time:  05/06/2022 8:02 PM   Critical care end time:  05/06/2022 8:02 PM   Critical care was necessary to treat or prevent imminent or life-threatening deterioration of the following conditions: Atrial flutter with RVR.   Critical care was time spent  personally by me on the following activities:  Development of treatment plan with patient or surrogate, discussions with consultants, evaluation of patient's response to treatment, examination of patient, ordering and review of laboratory studies, ordering and review of radiographic studies, ordering and performing treatments and interventions, pulse oximetry, re-evaluation of patient's condition and review of old charts   Care discussed with: admitting provider       Medications Ordered in ED Medications  diltiazem (CARDIZEM) 1 mg/mL load via infusion 10 mg (10 mg Intravenous Bolus from Bag 05/06/22 1957)    And  diltiazem (CARDIZEM) 125 mg in dextrose 5% 125 mL (1 mg/mL) infusion (5 mg/hr Intravenous New Bag/Given 05/06/22 1957)  potassium chloride 10 mEq in 100 mL IVPB (has no administration in time range)    ED Course/ Medical Decision Making/ A&P Clinical Course as of 05/06/22 2015  Sun May 06, 2022  1847 History of CHF, last echocardiogram 11/2021 notable for EF between 55 to 60%. [HS]  1906 I spoke with the patient's other daughter on the phone, she states the patient is definitely on Eliquis and has a history of atrial for flutter and atrial fib.  He was previously seen at heart care, he is on it every day.  Has not missed any doses of the Eliquis. [HS]  1949 CBC with Differential(!) Elevated with left shift [HS]  1950 Brain natriuretic peptide Within normal limits [HS]  1950 DG Chest Portable 1 View No acute process [HS]  2001 Troponin I (High Sensitivity)(!) Elevated, suspect due to demand ischemia [HS]  99991111 Basic metabolic panel(!!) Significantly hypokalemic with a potassium of 2.6, mag is within normal limits.  Will proceed to replenish.  Creatinine is elevated 1.98, slightly increased from baseline. [HS]  2002 Pulse Rate: 78 Erroneous heart rate 148 in room. [HS]    Clinical Course User Index [HS] Sherrill Raring, PA-C                             Medical Decision  Making Amount and/or Complexity of Data Reviewed Labs: ordered. Decision-making details documented in ED Course. Radiology: ordered. Decision-making details documented in ED Course.  Risk Prescription drug management. Decision regarding hospitalization.   Patient presents due to altered  mental status.  Differential is broad and includes encephalopathy versus dementia versus psychiatric versus TIA/CVA versus electrolyte derangement, TSH, infectious etiology.  I obtained independent history from patient's 2 daughters, chart review.  He did find any documented history of anticoagulation use or atrial fibs or atrial flutter but patient and daughter are very adamant that they have a history of this, patient was previously on warfarin and he is currently on Eliquis they have the prescription bottles.  Based on this conversation and prescription I do not think this is a new onset of atrial flutter although he is tachycardic.    Will start patient on diltiazem for rate control, will check labs including TSH, BNP, CBC, chemistry, chest x-ray, CT head.  I interpreted, ordered and viewed laboratory workup.  See ED course.  Notably patient is hypokalemic with a potassium of 2.6, Trope mildly elevated at 26 consistent with demand ischemia.  Patient will need admission for A-fib with RVR, hypokalemia and change in mental status.  I will consult the hospitalist for admission.        Final Clinical Impression(s) / ED Diagnoses Final diagnoses:  Atrial flutter with rapid ventricular response (HCC)  Hypokalemia  Altered mental status, unspecified altered mental status type    Rx / DC Orders ED Discharge Orders     None         Sherrill Raring, PA-C 05/06/22 2015    Varney Biles, MD 05/09/22 1541

## 2022-05-07 ENCOUNTER — Inpatient Hospital Stay (HOSPITAL_COMMUNITY): Payer: Medicare HMO

## 2022-05-07 DIAGNOSIS — I4892 Unspecified atrial flutter: Secondary | ICD-10-CM | POA: Diagnosis not present

## 2022-05-07 DIAGNOSIS — I2510 Atherosclerotic heart disease of native coronary artery without angina pectoris: Secondary | ICD-10-CM | POA: Diagnosis not present

## 2022-05-07 DIAGNOSIS — G4733 Obstructive sleep apnea (adult) (pediatric): Secondary | ICD-10-CM

## 2022-05-07 DIAGNOSIS — N1831 Chronic kidney disease, stage 3a: Secondary | ICD-10-CM

## 2022-05-07 DIAGNOSIS — E876 Hypokalemia: Secondary | ICD-10-CM | POA: Diagnosis not present

## 2022-05-07 DIAGNOSIS — I10 Essential (primary) hypertension: Secondary | ICD-10-CM

## 2022-05-07 DIAGNOSIS — R4182 Altered mental status, unspecified: Secondary | ICD-10-CM

## 2022-05-07 DIAGNOSIS — E785 Hyperlipidemia, unspecified: Secondary | ICD-10-CM

## 2022-05-07 LAB — RPR: RPR Ser Ql: NONREACTIVE

## 2022-05-07 LAB — BASIC METABOLIC PANEL
Anion gap: 10 (ref 5–15)
Anion gap: 13 (ref 5–15)
BUN: 23 mg/dL (ref 8–23)
BUN: 21 mg/dL (ref 8–23)
CO2: 23 mmol/L (ref 22–32)
CO2: 21 mmol/L — ABNORMAL LOW (ref 22–32)
Calcium: 8 mg/dL — ABNORMAL LOW (ref 8.9–10.3)
Calcium: 8.9 mg/dL (ref 8.9–10.3)
Chloride: 104 mmol/L (ref 98–111)
Chloride: 104 mmol/L (ref 98–111)
Creatinine, Ser: 1.72 mg/dL — ABNORMAL HIGH (ref 0.61–1.24)
Creatinine, Ser: 1.71 mg/dL — ABNORMAL HIGH (ref 0.61–1.24)
GFR, Estimated: 42 mL/min — ABNORMAL LOW (ref >60)
GFR, Estimated: 43 mL/min — ABNORMAL LOW (ref >60)
Glucose, Bld: 93 mg/dL (ref 70–99)
Glucose, Bld: 136 mg/dL — ABNORMAL HIGH (ref 70–99)
Potassium: 2.6 mmol/L — CL (ref 3.5–5.1)
Potassium: 3.2 mmol/L — ABNORMAL LOW (ref 3.5–5.1)
Sodium: 137 mmol/L (ref 135–145)
Sodium: 138 mmol/L (ref 135–145)

## 2022-05-07 LAB — URINALYSIS, COMPLETE (UACMP) WITH MICROSCOPIC
Bacteria, UA: NONE SEEN
Bilirubin Urine: NEGATIVE
Glucose, UA: NEGATIVE mg/dL
Hgb urine dipstick: NEGATIVE
Ketones, ur: NEGATIVE mg/dL
Leukocytes,Ua: NEGATIVE
Nitrite: NEGATIVE
Protein, ur: 30 mg/dL — AB
Specific Gravity, Urine: 1.014 (ref 1.005–1.030)
pH: 5 (ref 5.0–8.0)

## 2022-05-07 LAB — CBC WITH DIFFERENTIAL/PLATELET
Abs Immature Granulocytes: 0.09 10*3/uL — ABNORMAL HIGH (ref 0.00–0.07)
Basophils Absolute: 0.1 10*3/uL (ref 0.0–0.1)
Basophils Relative: 0 %
Eosinophils Absolute: 0.1 10*3/uL (ref 0.0–0.5)
Eosinophils Relative: 1 %
HCT: 40.2 % (ref 39.0–52.0)
Hemoglobin: 13.6 g/dL (ref 13.0–17.0)
Immature Granulocytes: 1 %
Lymphocytes Relative: 28 %
Lymphs Abs: 5.2 10*3/uL — ABNORMAL HIGH (ref 0.7–4.0)
MCH: 28.8 pg (ref 26.0–34.0)
MCHC: 33.8 g/dL (ref 30.0–36.0)
MCV: 85.2 fL (ref 80.0–100.0)
Monocytes Absolute: 2 10*3/uL — ABNORMAL HIGH (ref 0.1–1.0)
Monocytes Relative: 11 %
Neutro Abs: 11.2 10*3/uL — ABNORMAL HIGH (ref 1.7–7.7)
Neutrophils Relative %: 59 %
Platelets: 238 10*3/uL (ref 150–400)
RBC: 4.72 MIL/uL (ref 4.22–5.81)
RDW: 12.8 % (ref 11.5–15.5)
WBC: 18.7 10*3/uL — ABNORMAL HIGH (ref 4.0–10.5)
nRBC: 0 % (ref 0.0–0.2)

## 2022-05-07 LAB — RAPID URINE DRUG SCREEN, HOSP PERFORMED
Amphetamines: NOT DETECTED
Barbiturates: NOT DETECTED
Benzodiazepines: NOT DETECTED
Cocaine: NOT DETECTED
Opiates: NOT DETECTED
Tetrahydrocannabinol: POSITIVE — AB

## 2022-05-07 LAB — MAGNESIUM: Magnesium: 2 mg/dL (ref 1.7–2.4)

## 2022-05-07 LAB — FOLATE: Folate: 34.8 ng/mL (ref >5.9)

## 2022-05-07 LAB — AMMONIA: Ammonia: 34 umol/L (ref 9–35)

## 2022-05-07 LAB — VITAMIN B12: Vitamin B-12: 114 pg/mL — ABNORMAL LOW (ref 180–914)

## 2022-05-07 MED ORDER — BUPROPION HCL ER (XL) 150 MG PO TB24
300.0000 mg | ORAL_TABLET | Freq: Every day | ORAL | Status: DC
  Administered 2022-05-08 – 2022-05-10 (×3): 300 mg via ORAL
  Filled 2022-05-07 (×2): qty 2
  Filled 2022-05-07: qty 1

## 2022-05-07 MED ORDER — RAMELTEON 8 MG PO TABS
8.0000 mg | ORAL_TABLET | Freq: Every day | ORAL | Status: DC
  Administered 2022-05-07 – 2022-05-09 (×3): 8 mg via ORAL
  Filled 2022-05-07 (×3): qty 1

## 2022-05-07 MED ORDER — PRASUGREL HCL 10 MG PO TABS
10.0000 mg | ORAL_TABLET | Freq: Every day | ORAL | Status: DC
  Administered 2022-05-07 – 2022-05-09 (×3): 10 mg via ORAL
  Filled 2022-05-07 (×4): qty 1

## 2022-05-07 MED ORDER — POTASSIUM CHLORIDE 10 MEQ/100ML IV SOLN
10.0000 meq | INTRAVENOUS | Status: AC
  Administered 2022-05-07 (×4): 10 meq via INTRAVENOUS
  Filled 2022-05-07 (×4): qty 100

## 2022-05-07 MED ORDER — POTASSIUM CHLORIDE 10 MEQ/100ML IV SOLN
10.0000 meq | INTRAVENOUS | Status: DC
  Filled 2022-05-07: qty 100

## 2022-05-07 MED ORDER — ENOXAPARIN SODIUM 60 MG/0.6ML IJ SOSY
60.0000 mg | PREFILLED_SYRINGE | Freq: Once | INTRAMUSCULAR | Status: AC
  Administered 2022-05-07: 60 mg via SUBCUTANEOUS
  Filled 2022-05-07: qty 0.6

## 2022-05-07 MED ORDER — LACTATED RINGERS IV SOLN: INTRAVENOUS | Status: DC

## 2022-05-07 MED ORDER — ROSUVASTATIN CALCIUM 5 MG PO TABS
10.0000 mg | ORAL_TABLET | Freq: Every day | ORAL | Status: DC
  Administered 2022-05-07 – 2022-05-10 (×4): 10 mg via ORAL
  Filled 2022-05-07 (×4): qty 2

## 2022-05-07 MED ORDER — HALOPERIDOL LACTATE 5 MG/ML IJ SOLN
2.0000 mg | Freq: Once | INTRAMUSCULAR | Status: AC
  Administered 2022-05-07: 2 mg via INTRAMUSCULAR
  Filled 2022-05-07: qty 1

## 2022-05-07 MED ORDER — POTASSIUM CHLORIDE CRYS ER 10 MEQ PO TBCR: 10.0000 meq | EXTENDED_RELEASE_TABLET | Freq: Every day | ORAL | Status: DC

## 2022-05-07 MED ORDER — POTASSIUM CHLORIDE CRYS ER 20 MEQ PO TBCR
40.0000 meq | EXTENDED_RELEASE_TABLET | Freq: Every day | ORAL | Status: AC
  Administered 2022-05-08 – 2022-05-09 (×2): 40 meq via ORAL
  Filled 2022-05-07 (×2): qty 2

## 2022-05-07 MED ORDER — POTASSIUM CHLORIDE CRYS ER 20 MEQ PO TBCR: 40.0000 meq | EXTENDED_RELEASE_TABLET | Freq: Once | ORAL | Status: DC

## 2022-05-07 MED ORDER — QUETIAPINE FUMARATE 25 MG PO TABS
25.0000 mg | ORAL_TABLET | Freq: Three times a day (TID) | ORAL | Status: DC | PRN
  Administered 2022-05-07 – 2022-05-09 (×3): 25 mg via ORAL
  Filled 2022-05-07 (×4): qty 1

## 2022-05-07 MED ORDER — ZOLPIDEM TARTRATE 5 MG PO TABS
5.0000 mg | ORAL_TABLET | Freq: Once | ORAL | Status: AC
  Administered 2022-05-07: 5 mg via ORAL
  Filled 2022-05-07: qty 1

## 2022-05-07 MED ORDER — ENOXAPARIN SODIUM 100 MG/ML IJ SOSY
100.0000 mg | PREFILLED_SYRINGE | Freq: Two times a day (BID) | INTRAMUSCULAR | Status: DC
  Administered 2022-05-07 (×2): 100 mg via SUBCUTANEOUS
  Filled 2022-05-07 (×3): qty 1

## 2022-05-07 MED ORDER — POTASSIUM CHLORIDE CRYS ER 20 MEQ PO TBCR
40.0000 meq | EXTENDED_RELEASE_TABLET | ORAL | Status: AC
  Administered 2022-05-07 (×2): 40 meq via ORAL
  Filled 2022-05-07: qty 2

## 2022-05-07 NOTE — ED Notes (Signed)
Pt report received from previous nurse. Pt A&O x4, vitals stable, denies needs/complaints. Call bell in reach. No acute distress noted. Safety sitter at bedside

## 2022-05-07 NOTE — Significant Event (Signed)
I was called in to see the patient for agitation and confusion.  Patient had pulled out IV line and was threatening to leave and was aggressive.  Patient stated that he did not get time to urinate and nobody attended his needs so he was upset.  After discussing with him he seems to be calmer.  Communicated with psychiatry.  At this time we will start the patient on Seroquel as needed for agitation, will give 1 dose of IM Haldol.  I have communicated with the nursing staff regarding the instructions.  Patient did have IV for Cardizem drip for atrial flutter.  Will restart when IV is okay if not might need oral Cardizem.  Patient is not on monitor at this time due to agitation and wandering.  Sitter was requested but not available at bedside.

## 2022-05-07 NOTE — Hospital Course (Addendum)
Patient is a 72 year old male with past medical history of HTN, HLD, CAD, CHF, OSA not on CPAP presented to the hospital brought in by her family for acting differently for a week with intermittent episodes of slurred speech and word finding difficulty.  At nighttime patient was having some delusional thoughts and beliefs.  In the ED, patient was not delusional.  He was noted to have atrial flutter with heart rate at 140s and Cardizem drip was initiated.    Assessment and plan.   Atrial flutter with rapid ventricular response Likely new onset.  On Cardizem drip.  Cardiology has been consulted.  Check TSH within normal range.  2D echocardiogram with LV ejection fraction of 55 to 60% with regional wall motion abnormality from previous anteroseptal LAD infarct.    Hypokalemia Been aggressively replenished.  Latest potassium of 3.2 from 2.6.  Will continue to replenish and check levels in AM.   Acute alteration in mental status Sudden etiology but multifactorial.  CT head was normal.  Did have mild leukocytosis on presentation.  Chest x-ray without any infiltrate.  Urinalysis was negative for infection.  Ammonia level within normal range.  Vitamin B12 level at 114 and low.  Continue vitamin B12 supplementation.  Urine drug screen showed THC positive.Folate within normal limits.  RPR pending.  HIV nonreactive.  MRI of the brain was negative for acute findings but small vessel chronic changes.   Mood disorder.   Continue bupropion and paroxetine.  Consult psychiatry.    Chronic leukocytosis Has been elevated since 2012.  Follows up with heme at oncology as outpatient.  Pulmonary nodules: Followed by pulmonology as outpatient.  Coronary artery disease/ H/o STEMIx2-continue Crestor, Effient.  No acute disease.  Dyslipidemia-continue Crestor  Stage 3a chronic kidney disease (CKD)-stable at this time.  Check BMP

## 2022-05-07 NOTE — ED Notes (Signed)
Pt IV placed again, no acute distress noted. Restarted cardizem drip and reconnected pt to all monitors.
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
## 2022-05-07 NOTE — ED Notes (Signed)
MD at bedside for evaluation

## 2022-05-07 NOTE — ED Notes (Signed)
Pt found in room, IV pulled out, blood splattered all over room. Patient peed in corner of the room and in bed. Patient refusing to let nurse apply guaze to IV site. Patient refusing to stay at this time. MD paged.

## 2022-05-07 NOTE — ED Notes (Signed)
Patient transported to MRI 

## 2022-05-07 NOTE — ED Notes (Signed)
Pt found wandering at the side of the bed with gown pulled off. Attempted to reorient patient,  unsuccessful at this time. Patient placed in bed. MD notified.

## 2022-05-07 NOTE — ED Notes (Addendum)
Cardizem drip increased due to SBP > 110 & HR 106-115 per order parameters; pt in no acute distress, call bell in reach.

## 2022-05-07 NOTE — ED Notes (Signed)
Patient returned from MRI.

## 2022-05-07 NOTE — Progress Notes (Signed)
PROGRESS NOTE    Brendan Butler  [id number] DOB: 12-29-1950 DOA: 05/06/2022 PCP: Wenda Low, MD    Brief Narrative:  Patient is a 72 year old male with past medical history of HTN, HLD, CAD, CHF, OSA not on CPAP presented to the hospital brought in by her family for acting differently for a week with intermittent episodes of slurred speech and word finding difficulty.  At nighttime patient was having some delusional thoughts and beliefs.  In the ED, patient was not delusional.  He was noted to have atrial flutter with heart rate at 140s and Cardizem drip was initiated.    Assessment and plan.   Atrial flutter with rapid ventricular response Likely new onset.  On Cardizem drip.  Cardiology has been consulted.  Check TSH within normal range.  2D echocardiogram with LV ejection fraction of 55 to 60% with regional wall motion abnormality from previous anteroseptal LAD infarct.    Hypokalemia Been aggressively replenished.  Latest potassium of 3.2 from 2.6.  Will continue to replenish and check levels in AM.   Acute alteration in mental status Sudden etiology but multifactorial.  CT head was normal.  Did have mild leukocytosis on presentation.  Chest x-ray without any infiltrate.  Urinalysis was negative for infection.  Ammonia level within normal range.  Vitamin B12 level at 114 and low.  Continue vitamin B12 supplementation.  Urine drug screen showed THC positive.Folate within normal limits.  RPR pending.  HIV nonreactive.  MRI of the brain was negative for acute findings but small vessel chronic changes.  Will put the patient on 1.-1 sitter since the patient is wandering in the room and is confused.   Mood disorder.  History of depression anxiety alcohol use disorder. Continue bupropion and paroxetine bupropion dose has been decreased..  Patient continues to be confused and delirious.  Psychiatry has seen the patient.  Remelton has been added to the regimen.    Chronic  leukocytosis Has been elevated since 2012.  Follows up with heme at oncology as outpatient.  Pulmonary nodules: Followed by pulmonology as outpatient.  Coronary artery disease/ H/o STEMIx2-continue Crestor, Effient.  No acute disease.  Dyslipidemia-continue Crestor  Stage 3a chronic kidney disease (CKD)-stable at this time.  Check BMP  History of sleep apnea.  Patient had refused wearing CPAP in the past.   DVT prophylaxis: On Lovenox.   Code Status:     Code Status: Full Code  Disposition: Home likely in 1 to 2 days  Status is: Inpatient Remains inpatient appropriate because: Continued agitation confusion delirium,   Family Communication: None  Consultants:  Psychiatry Cardiology  Procedures:  None  Antimicrobials:  None  Anti-infectives (From admission, onward)    None      Subjective: Today, patient was seen and examined at bedside.  Patient appears to be confused and disoriented and wandering in the room as per the nursing staff.  Was alert awake and communicative with me.  Objective: Vitals:   05/07/22 1100 05/07/22 1213 05/07/22 1305 05/07/22 1400  BP: 137/77  (!) 151/88 (!) 150/69  Pulse: 74  64 75  Resp: 18  14 (!) 21  Temp:  97.9 F (36.6 C)    TempSrc:  Oral    SpO2: 100%  100% 100%  Weight:      Height:        Intake/Output Summary (Last 24 hours) at 05/07/2022 1455 Last data filed at 05/07/2022 0412 Gross per 24 hour  Intake 210.43 ml  Output --  Net 210.43 ml   Filed Weights   05/06/22 1822  Weight: 102.1 kg    Physical Examination:  Body mass index is 32.28 kg/m.   General: Obese built, not in obvious distress HENT:   No scleral pallor or icterus noted. Oral mucosa is moist.  Chest:  Clear breath sounds.  Diminished breath sounds bilaterally. No crackles or wheezes.  CVS: S1 &S2 heard. No murmur.  Regular rate and rhythm. Abdomen: Soft, nontender, nondistended.  Bowel sounds are heard.   Extremities: No cyanosis, clubbing or  edema.  Peripheral pulses are palpable. Psych: Alert, awake and Communicative, CNS:  No cranial nerve deficits.  Power equal in all extremities.   Skin: Warm and dry.  No rashes noted.  Data Reviewed:   CBC: Recent Labs  Lab 05/06/22 1850 05/06/22 2207 05/07/22 0200  WBC 17.5* 19.5* 18.7*  NEUTROABS 11.8*  --  11.2*  HGB 15.8 15.2 13.6  HCT 45.6 44.0 40.2  MCV 83.4 85.1 85.2  PLT 256 257 [id number]    Basic Metabolic Panel: Recent Labs  Lab 05/06/22 1850 05/06/22 2207 05/07/22 0200 05/07/22 0940  NA 138  --  137 138  K 2.6*  --  2.6* 3.2*  CL 101  --  104 104  CO2 25  --  23 21*  GLUCOSE 118*  --  93 136*  BUN 25*  --  23 21  CREATININE 1.98* 1.91* 1.72* 1.71*  CALCIUM 8.8*  --  8.0* 8.9  MG 2.1  --  2.0  --     Liver Function Tests: No results for input(s): "AST", "ALT", "ALKPHOS", "BILITOT", "PROT", "ALBUMIN" in the last 168 hours.   Radiology Studies: MR BRAIN WO CONTRAST  Result Date: 05/07/2022 CLINICAL DATA:  Slurred speech EXAM: MRI HEAD WITHOUT CONTRAST TECHNIQUE: Multiplanar, multiecho pulse sequences of the brain and surrounding structures were obtained without intravenous contrast. COMPARISON:  Head CT yesterday FINDINGS: Brain: Diffusion imaging is normal. Chronic small-vessel ischemic changes are seen throughout the pons. Few old small vessel cerebellar infarctions. Cerebral hemispheres show moderate chronic small-vessel ischemic changes of the white matter. No large vessel territory stroke. No mass lesion, hemorrhage, hydrocephalus or extra-axial collection. Vascular: Major vessels at the base of the brain show flow. Skull and upper cervical spine: Negative Sinuses/Orbits: Clear/normal Other: None IMPRESSION: No acute finding. Moderate chronic small-vessel ischemic changes of the pons and cerebral hemispheric white matter. Electronically Signed   By: Nelson Chimes M.D.   On: 05/07/2022 13:39   CT Head Wo Contrast  Result Date: 05/06/2022 CLINICAL DATA:   Headache. EXAM: CT HEAD WITHOUT CONTRAST TECHNIQUE: Contiguous axial images were obtained from the base of the skull through the vertex without intravenous contrast. RADIATION DOSE REDUCTION: This exam was performed according to the departmental dose-optimization program which includes automated exposure control, adjustment of the mA and/or kV according to patient size and/or use of iterative reconstruction technique. COMPARISON:  Head CT dated 12/24/2021. FINDINGS: Brain: Mild age-related atrophy and chronic microvascular ischemic changes. Small old bilateral basal ganglia lacunar infarcts. There is no acute intracranial hemorrhage. No mass effect or midline shift. No extra-axial fluid collection. Vascular: No hyperdense vessel or unexpected calcification. Skull: Normal. Negative for fracture or focal lesion. Sinuses/Orbits: The visualized paranasal sinuses and the mastoid air cells are clear. Right mastoid effusions. No air-fluid level. Other: None IMPRESSION: 1. No acute intracranial pathology. 2. Mild age-related atrophy and chronic microvascular ischemic changes. Small old bilateral basal ganglia lacunar infarcts. Electronically Signed   By: Milas Hock  Radparvar M.D.   On: 05/06/2022 20:00   DG Chest Portable 1 View  Result Date: 05/06/2022 CLINICAL DATA:  Shortness of breath EXAM: PORTABLE CHEST 1 VIEW COMPARISON:  Chest x-ray 12/28/2021 FINDINGS: The heart size and mediastinal contours are within normal limits. Both lungs are clear. The visualized skeletal structures are unremarkable. There surgical clips in the upper abdomen. IMPRESSION: No active disease. Electronically Signed   By: Ronney Asters M.D.   On: 05/06/2022 19:43      LOS: 1 day    Flora Lipps, MD [facility] Available via Epic secure chat 7am-7pm After these hours, please refer to coverage provider listed on [email or web address] 05/07/2022, 2:55 PM

## 2022-05-07 NOTE — ED Notes (Signed)
Pt finished breakfast and answered all orientation questions for me.

## 2022-05-07 NOTE — ED Notes (Signed)
Pt refusing IV access and cardiac monitoring at this time.
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
## 2022-05-07 NOTE — ED Notes (Signed)
Called for safety sitter, none available.

## 2022-05-08 DIAGNOSIS — I2510 Atherosclerotic heart disease of native coronary artery without angina pectoris: Secondary | ICD-10-CM | POA: Diagnosis not present

## 2022-05-08 DIAGNOSIS — I4892 Unspecified atrial flutter: Secondary | ICD-10-CM | POA: Diagnosis not present

## 2022-05-08 DIAGNOSIS — F22 Delusional disorders: Secondary | ICD-10-CM

## 2022-05-08 DIAGNOSIS — G4733 Obstructive sleep apnea (adult) (pediatric): Secondary | ICD-10-CM | POA: Diagnosis not present

## 2022-05-08 DIAGNOSIS — N1831 Chronic kidney disease, stage 3a: Secondary | ICD-10-CM | POA: Diagnosis not present

## 2022-05-08 LAB — BASIC METABOLIC PANEL
Anion gap: 11 (ref 5–15)
BUN: 16 mg/dL (ref 8–23)
CO2: 21 mmol/L — ABNORMAL LOW (ref 22–32)
Calcium: 8.6 mg/dL — ABNORMAL LOW (ref 8.9–10.3)
Chloride: 108 mmol/L (ref 98–111)
Creatinine, Ser: 1.34 mg/dL — ABNORMAL HIGH (ref 0.61–1.24)
GFR, Estimated: 57 mL/min — ABNORMAL LOW (ref >60)
Glucose, Bld: 108 mg/dL — ABNORMAL HIGH (ref 70–99)
Potassium: 3.3 mmol/L — ABNORMAL LOW (ref 3.5–5.1)
Sodium: 140 mmol/L (ref 135–145)

## 2022-05-08 LAB — CBC
HCT: 36.8 % — ABNORMAL LOW (ref 39.0–52.0)
Hemoglobin: 12.6 g/dL — ABNORMAL LOW (ref 13.0–17.0)
MCH: 29.2 pg (ref 26.0–34.0)
MCHC: 34.2 g/dL (ref 30.0–36.0)
MCV: 85.2 fL (ref 80.0–100.0)
Platelets: 213 10*3/uL (ref 150–400)
RBC: 4.32 MIL/uL (ref 4.22–5.81)
RDW: 12.9 % (ref 11.5–15.5)
WBC: 11.2 10*3/uL — ABNORMAL HIGH (ref 4.0–10.5)
nRBC: 0 % (ref 0.0–0.2)

## 2022-05-08 LAB — MAGNESIUM: Magnesium: 1.8 mg/dL (ref 1.7–2.4)

## 2022-05-08 MED ORDER — CYANOCOBALAMIN 1000 MCG/ML IJ SOLN
1000.0000 ug | Freq: Every day | INTRAMUSCULAR | Status: DC
  Administered 2022-05-08 – 2022-05-10 (×3): 1000 ug via INTRAMUSCULAR
  Filled 2022-05-08 (×3): qty 1

## 2022-05-08 MED ORDER — APIXABAN 5 MG PO TABS
5.0000 mg | ORAL_TABLET | Freq: Two times a day (BID) | ORAL | Status: DC
  Administered 2022-05-08 – 2022-05-10 (×5): 5 mg via ORAL
  Filled 2022-05-08 (×5): qty 1

## 2022-05-08 NOTE — ED Notes (Addendum)
Cardizem drip increased due to SBP > 110 & HR 106-115 per order parameters; pt in no acute distress, denies complaints. call bell in reach.

## 2022-05-08 NOTE — ED Notes (Signed)
ED TO INPATIENT HANDOFF REPORT  ED Nurse Name and Phone #: [phone]  S Name/Age/Gender Marylene Buerger 72 y.o. male Room/Bed: 039C/039C  Code Status   Code Status: Full Code  Home/SNF/Other Home Patient oriented to: self and place Is this baseline? No   Triage Complete: Triage complete  Chief Complaint Atrial flutter with rapid ventricular response [facility]) [I48.92]  Triage Note Pt daughter states that patient has had behavior changed and confusing. Pt was forgetting words and not sleeping well. The change started last Monday.   The daughter said confusion has been the same all week and not getting any worse. Pt lives at home by himself.    Allergies Allergies  Allergen Reactions   Levitra [Vardenafil] Other (See Comments)    Blurred vision   Aspirin Hives   Clopidogrel Other (See Comments)    Not effective anti platelet med for patient-switched to effient   Lipitor [Atorvastatin] Other (See Comments)    Muscle cramps; patient states he does not take medication at home    Level of Care/Admitting Diagnosis ED Disposition     ED Disposition  [place]: [place] [id number]  Level of Care: Telemetry Cardiac [[id number]]  May admit patient to [facility] or [facility] if equivalent level of care is available:: Yes  Covid Evaluation: Confirmed COVID Negative  Diagnosis: Atrial flutter with rapid ventricular response [facility]) [id number]  Admitting Physician: Quintella Baton [[id number]]  Attending Physician: Quintella Baton [id number]  Certification:: I certify this patient will need inpatient services for at least 2 midnights  Estimated Length of Stay: 2          B Medical/Surgery History Past Medical History:  Diagnosis Date   Acute myocardial infarction of other anterior wall, initial episode of care 12/29/2012   Promus stent to LAD   Acute ST segment elevation MI ([place]) 01/05/2013   secondary to thrombus in stent;  Brilintta changed to Effient pt with ASA allergy   Coronary artery disease 2001   stent to LAD and patent 2007 on cath   Echocardiogram abnormal 01/06/2013   EF [id number], grade I diastolic dysfunction   Erectile dysfunction    Hyperlipidemia LDL goal <70    Hypertension    OSA (obstructive sleep apnea)    No CPAP use overnight   Past Surgical History:  Procedure Laterality Date   BACK SURGERY  2007   Decompression lumbar laminectomy and micro discectomy, L4-5   BRONCHIAL BIOPSY  12/28/2021   Procedure: BRONCHIAL BIOPSIES;  Surgeon: Collene Gobble, MD;  Location: [place];  Service: Pulmonary;;   BRONCHIAL BRUSHINGS  12/28/2021   Procedure: BRONCHIAL BRUSHINGS;  Surgeon: Collene Gobble, MD;  Location: [place];  Service: Pulmonary;;   BRONCHIAL NEEDLE ASPIRATION BIOPSY  12/28/2021   Procedure: BRONCHIAL NEEDLE ASPIRATION BIOPSIES;  Surgeon: Collene Gobble, MD;  Location: [place] [facility];  Service: Pulmonary;;   BRONCHIAL WASHINGS  12/28/2021   Procedure: BRONCHIAL WASHINGS;  Surgeon: Collene Gobble, MD;  Location: [place] [facility];  Service: Pulmonary;;   CARDIAC CATHETERIZATION  2007   patent stent to LAD and normal Cors   CHOLECYSTECTOMY N/A 12/26/2021   Procedure: LAPAROSCOPIC CHOLECYSTECTOMY, LAPAROSCOPIC LYSIS OF ADHESIONS GREATER THAN 1 HOUR;  Surgeon: [name], MD;  Location: [place];  Service: General;  Laterality: N/A;   CORONARY ANGIOPLASTY WITH STENT PLACEMENT  2001   to LAD   CORONARY ANGIOPLASTY WITH STENT PLACEMENT  12/29/12  STEMI with promus DES to LAD   CORONARY ANGIOPLASTY WITH STENT PLACEMENT  01/05/13   STEMI with thrombosis in stent to LAD   FIDUCIAL MARKER PLACEMENT  12/28/2021   Procedure: FIDUCIAL MARKER PLACEMENT;  Surgeon: Collene Gobble, MD;  Location: [facility];  Service: Pulmonary;;   LEFT HEART CATH N/A 12/29/2012   Procedure: LEFT HEART CATH;  Surgeon: Jettie Booze, MD;  Location: [facility] CATH LAB;  Service: Cardiovascular;  Laterality: N/A;    LEFT HEART CATHETERIZATION WITH CORONARY ANGIOGRAM N/A 01/05/2013   Procedure: LEFT HEART CATHETERIZATION WITH CORONARY ANGIOGRAM;  Surgeon: Blane Ohara, MD;  Location: [facility] CATH LAB;  Service: Cardiovascular;  Laterality: N/A;   LEFT HEART CATHETERIZATION WITH CORONARY ANGIOGRAM N/A 04/22/2013   Procedure: LEFT HEART CATHETERIZATION WITH CORONARY ANGIOGRAM;  Surgeon: Troy Sine, MD;  Location: [facility] CATH LAB;  Service: Cardiovascular;  Laterality: N/A;   PARTIAL NEPHRECTOMY Left 1975   PATIENT ONLY HAS ONE KIDNEY   PERCUTANEOUS CORONARY STENT INTERVENTION (PCI-S)  12/29/2012   Procedure: PERCUTANEOUS CORONARY STENT INTERVENTION (PCI-S);  Surgeon: Jettie Booze, MD;  Location: [facility] CATH LAB;  Service: Cardiovascular;;     A IV Location/Drains/Wounds Patient Lines/Drains/Airways Status     Active Line/Drains/Airways     Name Placement date Placement time Site Days   Peripheral IV 05/07/22 1" Left;Posterior Forearm 05/07/22  2133  Forearm  1            Intake/Output Last 24 hours  Intake/Output Summary (Last 24 hours) at 05/08/2022 1156 Last data filed at 05/07/2022 1924 Gross per 24 hour  Intake --  Output 250 ml  Net -250 ml    Labs/Imaging Results for orders placed or performed during the hospital encounter of 05/06/22 (from the past 48 hour(s))  CBC with Differential     Status: Abnormal   Collection Time: 05/06/22  6:50 PM  Result Value Ref Range   WBC 17.5 (H) 4.0 - 10.5 K/uL   RBC 5.47 4.22 - 5.81 MIL/uL   Hemoglobin 15.8 13.0 - 17.0 g/dL   HCT 45.6 39.0 - 52.0 %   MCV 83.4 80.0 - 100.0 fL   MCH 28.9 26.0 - 34.0 pg   MCHC 34.6 30.0 - 36.0 g/dL   RDW 12.7 11.5 - 15.5 %   Platelets 256 150 - 400 K/uL   nRBC 0.0 0.0 - 0.2 %   Neutrophils Relative % 68 %   Neutro Abs 11.8 (H) 1.7 - 7.7 K/uL   Lymphocytes Relative 21 %   Lymphs Abs 3.7 0.7 - 4.0 K/uL   Monocytes Relative 10 %   Monocytes Absolute 1.8 (H) 0.1 - 1.0 K/uL   Eosinophils Relative 1 %    Eosinophils Absolute 0.1 0.0 - 0.5 K/uL   Basophils Relative 0 %   Basophils Absolute 0.0 0.0 - 0.1 K/uL   Immature Granulocytes 0 %   Abs Immature Granulocytes 0.06 0.00 - 0.07 K/uL    Comment: Performed at [facility], [address], [place], [place] [id number]  Basic metabolic panel     Status: Abnormal   Collection Time: 05/06/22  6:50 PM  Result Value Ref Range   Sodium 138 135 - 145 mmol/L   Potassium 2.6 (LL) 3.5 - 5.1 mmol/L    Comment: CRITICAL RESULT CALLED TO, READ BACK BY AND VERIFIED WITH K,SWORD RN '@1958'$  05/06/22 E,BENTON   Chloride 101 98 - 111 mmol/L   CO2 25 22 - 32 mmol/L   Glucose, Bld  118 (H) 70 - 99 mg/dL    Comment: Glucose reference range applies only to samples taken after fasting for at least 8 hours.   BUN 25 (H) 8 - 23 mg/dL   Creatinine, Ser 1.98 (H) 0.61 - 1.24 mg/dL   Calcium 8.8 (L) 8.9 - 10.3 mg/dL   GFR, Estimated 36 (L) >60 mL/min    Comment: (NOTE) Calculated using the CKD-EPI Creatinine Equation (2021)    Anion gap 12 5 - 15    Comment: Performed at [facility] [address] [place], [place] [address]  Troponin I (High Sensitivity)     Status: Abnormal   Collection Time: 05/06/22  6:50 PM  Result Value Ref Range   Troponin I (High Sensitivity) 28 (H) <18 ng/L    Comment: (NOTE) Elevated high sensitivity troponin I (hsTnI) values and significant  changes across serial measurements may suggest ACS but many other  chronic and acute conditions are known to elevate hsTnI results.  Refer to the "Links" section for chest pain algorithms and additional  guidance. Performed at [facility], [place] [address], [place], [place] [address]   Brain natriuretic peptide     Status: None   Collection Time: 05/06/22  6:50 PM  Result Value Ref Range   B Natriuretic Peptide 36.5 0.0 - 100.0 pg/mL    Comment: Performed at [facility] [address], [place], [place] [address]  TSH     Status: None   Collection Time: 05/06/22  6:50  PM  Result Value Ref Range   TSH 1.856 0.350 - 4.500 uIU/mL    Comment: Performed by a 3rd Generation assay with a functional sensitivity of <=0.01 uIU/mL. Performed at [facility], [place] [address] [place], [place] [address]   T4, free     Status: Abnormal   Collection Time: 05/06/22  6:50 PM  Result Value Ref Range   Free T4 1.19 (H) 0.61 - 1.12 ng/dL    Comment: (NOTE) Biotin ingestion may interfere with free T4 tests. If the results are inconsistent with the TSH level, previous test results, or the clinical presentation, then consider biotin interference. If needed, order repeat testing after stopping biotin. Performed at [facility], [place] [address] [place], [place] [address]   Magnesium     Status: None   Collection Time: 05/06/22  6:50 PM  Result Value Ref Range   Magnesium 2.1 1.7 - 2.4 mg/dL    Comment: Performed at [facility] [address] [place], [place] [address]  Troponin I (High Sensitivity)     Status: Abnormal   Collection Time: 05/06/22  9:10 PM  Result Value Ref Range   Troponin I (High Sensitivity) 28 (H) <18 ng/L    Comment: (NOTE) Elevated high sensitivity troponin I (hsTnI) values and significant  changes across serial measurements may suggest ACS but many other  chronic and acute conditions are known to elevate hsTnI results.  Refer to the "Links" section for chest pain algorithms and additional  guidance. Performed at [facility], [place] [address] [place], [place] [address]   HIV Antibody (routine testing w rflx)     Status: None   Collection Time: 05/06/22 10:07 PM  Result Value Ref Range   HIV Screen 4th Generation wRfx Non Reactive Non Reactive    Comment: Performed at [facility], [place] [address], [place], [place] [address]  CBC     Status: Abnormal  Collection Time: 05/06/22 10:07 PM  Result Value Ref Range   WBC 19.5 (H) 4.0 - 10.5 K/uL   RBC 5.17 4.22 - 5.81 MIL/uL   Hemoglobin 15.2 13.0 - 17.0 g/dL    HCT 44.0 39.0 - 52.0 %   MCV 85.1 80.0 - 100.0 fL   MCH 29.4 26.0 - 34.0 pg   MCHC 34.5 30.0 - 36.0 g/dL   RDW 12.7 11.5 - 15.5 %   Platelets 257 150 - 400 K/uL   nRBC 0.0 0.0 - 0.2 %    Comment: Performed at [facility], [place] [address] [place], [place] [address]  Creatinine, serum     Status: Abnormal   Collection Time: 05/06/22 10:07 PM  Result Value Ref Range   Creatinine, Ser 1.91 (H) 0.61 - 1.24 mg/dL   GFR, Estimated 37 (L) >60 mL/min    Comment: (NOTE) Calculated using the CKD-EPI Creatinine Equation (2021) Performed at [facility] [address] [place], [place] [id number]   Basic metabolic panel     Status: Abnormal   Collection Time: 05/07/22  2:00 AM  Result Value Ref Range   Sodium 137 135 - 145 mmol/L   Potassium 2.6 (LL) 3.5 - 5.1 mmol/L    Comment: CRITICAL RESULT CALLED TO, READ BACK BY AND VERIFIED WITH B.SANGALANG,RN. 0310 05/07/22. LPAIT   Chloride 104 98 - 111 mmol/L   CO2 23 22 - 32 mmol/L   Glucose, Bld 93 70 - 99 mg/dL    Comment: Glucose reference range applies only to samples taken after fasting for at least 8 hours.   BUN 23 8 - 23 mg/dL   Creatinine, Ser 1.72 (H) 0.61 - 1.24 mg/dL   Calcium 8.0 (L) 8.9 - 10.3 mg/dL   GFR, Estimated 42 (L) >60 mL/min    Comment: (NOTE) Calculated using the CKD-EPI Creatinine Equation (2021)    Anion gap 10 5 - 15    Comment: Performed at [facility] [address], [place], [place] [address]  Magnesium     Status: None   Collection Time: 05/07/22  2:00 AM  Result Value Ref Range   Magnesium 2.0 1.7 - 2.4 mg/dL    Comment: Performed at [facility], [place] [address], [place], [place] [address]  CBC with Differential/Platelet     Status: Abnormal   Collection Time: 05/07/22  2:00 AM  Result Value Ref Range   WBC 18.7 (H) 4.0 - 10.5 K/uL   RBC 4.72 4.22 - 5.81 MIL/uL   Hemoglobin 13.6 13.0 - 17.0 g/dL   HCT 40.2 39.0 - 52.0 %   MCV 85.2 80.0 - 100.0 fL   MCH 28.8 26.0 - 34.0 pg    MCHC 33.8 30.0 - 36.0 g/dL   RDW 12.8 11.5 - 15.5 %   Platelets 238 150 - 400 K/uL   nRBC 0.0 0.0 - 0.2 %   Neutrophils Relative % 59 %   Neutro Abs 11.2 (H) 1.7 - 7.7 K/uL   Lymphocytes Relative 28 %   Lymphs Abs 5.2 (H) 0.7 - 4.0 K/uL   Monocytes Relative 11 %   Monocytes Absolute 2.0 (H) 0.1 - 1.0 K/uL   Eosinophils Relative 1 %   Eosinophils Absolute 0.1 0.0 - 0.5 K/uL   Basophils Relative 0 %   Basophils Absolute 0.1 0.0 - 0.1 K/uL   Immature Granulocytes 1 %   Abs Immature Granulocytes 0.09 (H) 0.00 - 0.07 K/uL    Comment:  Performed at [facility], [place] [address], [place], [place] [address]  Folate     Status: None   Collection Time: 05/07/22  2:00 AM  Result Value Ref Range   Folate 34.8 >5.9 ng/mL    Comment: Performed at [facility] [address], [place], [place] [address]  Vitamin B12     Status: Abnormal   Collection Time: 05/07/22  2:35 AM  Result Value Ref Range   Vitamin B-12 114 (L) 180 - 914 pg/mL    Comment: (NOTE) This assay is not validated for testing neonatal or myeloproliferative syndrome specimens for Vitamin B12 levels. Performed at [facility], [place] [address], [place], [place] [address]   RPR     Status: None   Collection Time: 05/07/22  2:35 AM  Result Value Ref Range   RPR Ser Ql NON REACTIVE NON REACTIVE    Comment: Performed at [facility], [place] [address] [place], [place] [address]  Ammonia     Status: None   Collection Time: 05/07/22  2:35 AM  Result Value Ref Range   Ammonia 34 9 - 35 umol/L    Comment: Performed at [facility], [place] [address], [place], [place] [address]  Rapid urine drug screen (hospital performed)     Status: Abnormal   Collection Time: 05/07/22  3:07 AM  Result Value Ref Range   Opiates NONE DETECTED NONE DETECTED   Cocaine NONE DETECTED NONE DETECTED   Benzodiazepines NONE DETECTED NONE DETECTED   Amphetamines NONE DETECTED NONE DETECTED   Tetrahydrocannabinol POSITIVE (A) NONE  DETECTED   Barbiturates NONE DETECTED NONE DETECTED    Comment: (NOTE) DRUG SCREEN FOR MEDICAL PURPOSES ONLY.  IF CONFIRMATION IS NEEDED FOR ANY PURPOSE, NOTIFY LAB WITHIN 5 DAYS.  LOWEST DETECTABLE LIMITS FOR URINE DRUG SCREEN Drug Class                     Cutoff (ng/mL) Amphetamine and metabolites    1000 Barbiturate and metabolites    200 Benzodiazepine                 200 Opiates and metabolites        300 Cocaine and metabolites        300 THC                            50 Performed at [facility], [place] [address] [place], [place] [address]   Urinalysis, Complete w Microscopic -Urine, Clean Catch     Status: Abnormal   Collection Time: 05/07/22  3:07 AM  Result Value Ref Range   Color, Urine YELLOW YELLOW   APPearance CLEAR CLEAR   Specific Gravity, Urine 1.014 1.005 - 1.030   pH 5.0 5.0 - 8.0   Glucose, UA NEGATIVE NEGATIVE mg/dL   Hgb urine dipstick NEGATIVE NEGATIVE   Bilirubin Urine NEGATIVE NEGATIVE   Ketones, ur NEGATIVE NEGATIVE mg/dL   Protein, ur 30 (A) NEGATIVE mg/dL   Nitrite NEGATIVE NEGATIVE   Leukocytes,Ua NEGATIVE NEGATIVE   RBC / HPF 0-5 0 - 5 RBC/hpf   WBC, UA 0-5 0 - 5 WBC/hpf   Bacteria, UA NONE SEEN NONE SEEN   Squamous Epithelial / HPF 0-5 0 - 5 /HPF   Mucus PRESENT    Hyaline Casts, UA PRESENT     Comment: Performed at [facility], [place]  [address], [place], [place] [id number]  Basic metabolic panel     Status: Abnormal   Collection Time: 05/07/22  9:40 AM  Result Value Ref Range   Sodium 138 135 - 145 mmol/L   Potassium 3.2 (L) 3.5 - 5.1 mmol/L   Chloride 104 98 - 111 mmol/L   CO2 21 (L) 22 - 32 mmol/L   Glucose, Bld 136 (H) 70 - 99 mg/dL    Comment: Glucose reference range applies only to samples taken after fasting for at least 8 hours.   BUN 21 8 - 23 mg/dL   Creatinine, Ser 1.71 (H) 0.61 - 1.24 mg/dL   Calcium 8.9 8.9 - 10.3 mg/dL   GFR, Estimated 43 (L) >60 mL/min    Comment: (NOTE) Calculated using the CKD-EPI  Creatinine Equation (2021)    Anion gap 13 5 - 15    Comment: Performed at [facility] [address], [place], [place] [id number]  Basic metabolic panel     Status: Abnormal   Collection Time: 05/08/22  5:16 AM  Result Value Ref Range   Sodium 140 135 - 145 mmol/L   Potassium 3.3 (L) 3.5 - 5.1 mmol/L   Chloride 108 98 - 111 mmol/L   CO2 21 (L) 22 - 32 mmol/L   Glucose, Bld 108 (H) 70 - 99 mg/dL    Comment: Glucose reference range applies only to samples taken after fasting for at least 8 hours.   BUN 16 8 - 23 mg/dL   Creatinine, Ser 1.34 (H) 0.61 - 1.24 mg/dL   Calcium 8.6 (L) 8.9 - 10.3 mg/dL   GFR, Estimated 57 (L) >60 mL/min    Comment: (NOTE) Calculated using the CKD-EPI Creatinine Equation (2021)    Anion gap 11 5 - 15    Comment: Performed at [facility] [address], [place], [place] [address]  CBC     Status: Abnormal   Collection Time: 05/08/22  5:16 AM  Result Value Ref Range   WBC 11.2 (H) 4.0 - 10.5 K/uL   RBC 4.32 4.22 - 5.81 MIL/uL   Hemoglobin 12.6 (L) 13.0 - 17.0 g/dL   HCT 36.8 (L) 39.0 - 52.0 %   MCV 85.2 80.0 - 100.0 fL   MCH 29.2 26.0 - 34.0 pg   MCHC 34.2 30.0 - 36.0 g/dL   RDW 12.9 11.5 - 15.5 %   Platelets 213 150 - 400 K/uL   nRBC 0.0 0.0 - 0.2 %    Comment: Performed at [facility], [place] [address], [place], [place] [address]  Magnesium     Status: None   Collection Time: 05/08/22  5:16 AM  Result Value Ref Range   Magnesium 1.8 1.7 - 2.4 mg/dL    Comment: Performed at [facility] [address], [place] , [place] [address]   MR BRAIN WO CONTRAST  Result Date: 05/07/2022 CLINICAL DATA:  Slurred speech EXAM: MRI HEAD WITHOUT CONTRAST TECHNIQUE: Multiplanar, multiecho pulse sequences of the brain and surrounding structures were obtained without intravenous contrast. COMPARISON:  Head CT yesterday FINDINGS: Brain: Diffusion imaging is normal. Chronic small-vessel ischemic changes are seen throughout the pons. Few old  small vessel cerebellar infarctions. Cerebral hemispheres show moderate chronic small-vessel ischemic changes of the white matter. No large vessel territory stroke. No mass lesion, hemorrhage, hydrocephalus or extra-axial collection. Vascular: Major vessels at the base of the brain show flow. Skull and upper cervical spine: Negative Sinuses/Orbits: Clear/normal Other: None IMPRESSION:  No acute finding. Moderate chronic small-vessel ischemic changes of the pons and cerebral hemispheric white matter. Electronically Signed   By: Nelson Chimes M.D.   On: 05/07/2022 13:39   CT Head Wo Contrast  Result Date: 05/06/2022 CLINICAL DATA:  Headache. EXAM: CT HEAD WITHOUT CONTRAST TECHNIQUE: Contiguous axial images were obtained from the base of the skull through the vertex without intravenous contrast. RADIATION DOSE REDUCTION: This exam was performed according to the departmental dose-optimization program which includes automated exposure control, adjustment of the mA and/or kV according to patient size and/or use of iterative reconstruction technique. COMPARISON:  Head CT dated 12/24/2021. FINDINGS: Brain: Mild age-related atrophy and chronic microvascular ischemic changes. Small old bilateral basal ganglia lacunar infarcts. There is no acute intracranial hemorrhage. No mass effect or midline shift. No extra-axial fluid collection. Vascular: No hyperdense vessel or unexpected calcification. Skull: Normal. Negative for fracture or focal lesion. Sinuses/Orbits: The visualized paranasal sinuses and the mastoid air cells are clear. Right mastoid effusions. No air-fluid level. Other: None IMPRESSION: 1. No acute intracranial pathology. 2. Mild age-related atrophy and chronic microvascular ischemic changes. Small old bilateral basal ganglia lacunar infarcts. Electronically Signed   By: Anner [place] M.D.   On: 05/06/2022 20:00   DG Chest Portable 1 View  Result Date: 05/06/2022 CLINICAL DATA:  Shortness of breath EXAM:  PORTABLE CHEST 1 VIEW COMPARISON:  Chest x-ray 12/28/2021 FINDINGS: The heart size and mediastinal contours are within normal limits. Both lungs are clear. The visualized skeletal structures are unremarkable. There surgical clips in the upper abdomen. IMPRESSION: No active disease. Electronically Signed   By: Ronney Asters M.D.   On: 05/06/2022 19:43    Pending Labs Unresulted Labs (From admission, onward)    None       Vitals/Pain Today's Vitals   05/08/22 0900 05/08/22 0930 05/08/22 1000 05/08/22 1030  BP: (!) 119/50 (!) 143/62 (!) 140/72 (!) 142/98  Pulse: 74 75 80 95  Resp: '18 13 19 19  '$ Temp:      TempSrc:      SpO2: 99% 95% 95% 98%  Weight:      Height:      PainSc:        Isolation Precautions No active isolations  Medications Medications  diltiazem (CARDIZEM) 1 mg/mL load via infusion 10 mg (10 mg Intravenous Bolus from Bag 05/06/22 1957)    And  diltiazem (CARDIZEM) 125 mg in dextrose 5% 125 mL (1 mg/mL) infusion (10 mg/hr Intravenous New Bag/Given 05/08/22 0735)  PARoxetine (PAXIL) tablet 30 mg (30 mg Oral Given 05/08/22 1005)  acetaminophen (TYLENOL) tablet 650 mg (has no administration in time range)    Or  acetaminophen (TYLENOL) suppository 650 mg (has no administration in time range)  senna-docusate (Senokot-S) tablet 1 tablet (has no administration in time range)  ondansetron (ZOFRAN) tablet 4 mg (has no administration in time range)    Or  ondansetron (ZOFRAN) injection 4 mg (has no administration in time range)  rosuvastatin (CRESTOR) tablet 10 mg (10 mg Oral Given 05/08/22 1005)  prasugrel (EFFIENT) tablet 10 mg (10 mg Oral Given 05/08/22 1005)  ramelteon (ROZEREM) tablet 8 mg (8 mg Oral Given 05/07/22 2232)  potassium chloride SA (KLOR-CON M) CR tablet 40 mEq (40 mEq Oral Given 05/08/22 1005)  buPROPion (WELLBUTRIN XL) 24 hr tablet 300 mg (300 mg Oral Given 05/08/22 1006)  QUEtiapine (SEROQUEL) tablet 25 mg (25 mg Oral Given 05/07/22 2119)  apixaban (ELIQUIS)  tablet 5 mg (5 mg Oral Given 05/08/22  1005)  potassium chloride 10 mEq in 100 mL IVPB (0 mEq Intravenous Stopped 05/06/22 2333)  zolpidem (AMBIEN) tablet 5 mg (5 mg Oral Given 05/07/22 0206)  enoxaparin (LOVENOX) injection 60 mg (60 mg Subcutaneous Given 05/07/22 0207)  potassium chloride SA (KLOR-CON M) CR tablet 40 mEq (40 mEq Oral Given 05/07/22 0505)  potassium chloride 10 mEq in 100 mL IVPB (0 mEq Intravenous Stopped 05/07/22 1720)  haloperidol lactate (HALDOL) injection 2 mg (2 mg Intramuscular Given 05/07/22 1855)    Mobility walks     Focused Assessments Neuro Assessment Handoff:  Swallow screen pass? Yes  Cardiac Rhythm: Atrial flutter       Neuro Assessment: Exceptions to [facility] Neuro Checks:      Has TPA been given? No If patient is a Neuro Trauma and patient is going to OR before floor call report to [facility] nurse: [phone] or [phone]   R Recommendations: See Admitting Provider Note  Report given to:   Additional Notes: patient from home with AMS per family. Baseline unknown, A&O x 2 at this time. Patient in A flutter. On diltiazem at 10.

## 2022-05-08 NOTE — Consult Note (Signed)
[facility] New Face-to-Face Psychiatric Evaluation   Service Date: May 08, 2022 LOS:  LOS: 2 days    Assessment  Brendan Butler is a 72 y.o. male admitted medically for 05/06/2022  6:18 PM for altered mental status. He carries the psychiatric diagnoses of depression, anxiety, and alcohol use disorder (in remission) and has a past medical history of OSA not on CPAP, hypertension, hyperlipidemia, CAD, CHF. Psychiatry was consulted for delusional thoughts and AMS by Sherrill Raring, PA-C.   Was slightly more confused this morning. Does report improvement of sleep with ramelteon. Appears to have calmed down since yesterday agitation event in the evening. Fluctuations in mentation continue to point towards delirium. Should confusion worsen or persist and patient attempts to leave AMA, would recommend IVC for safety. Seroquel 25 mg PRN ordered to aid with delirium. Sitter at bedside. Would recommend moving to room with window at some point if patient continues to be hospitalized.   Current outpatient psychotropic medications include wellbutrin '150mg'$  tid and paxil '30mg'$  qd and historically he has had a good response to these medications. Based on patient's presentation of AMS at night in setting of four days of poor sleep with a past hx of insomnia, patient's presentation is most likely 2/2 delirium. Patient's delirium is likely 2/2 insomnia, underlying infection (WBC is 18.7 this AM), elyte abnormalities (K of 2.6 this AM). While unlikely, an NMDA antibody disorder in setting of pulmonary nodule is a possibility.   Diagnoses:  Active Hospital problems: Principal Problem:   Atrial flutter with rapid ventricular response (HCC) Active Problems:   Dyslipidemia   Essential hypertension   Coronary artery disease involving native coronary artery of native heart without angina pectoris   OSA on CPAP   Stage 3a chronic kidney disease (CKD) (HCC)   Bronchiectasis (HCC)   Hypokalemia    Altered mental status     Plan  ## Safety and Observation Level:  - Based on my clinical evaluation, I estimate the patient to be at low risk of self harm in the current setting.  - Delirium precautions - Continue sitter  ## Medications:  -Continue wellbutrin XL '300mg'$  qAM.  -Continue ramelteon '8mg'$  qhs for insomnia -START seroquel 25 mg tid prn for agitation  ## Further Work-up:  -- most recent EKG on 3/11 had QtC of 464 and was remarkable for atrial flutter with varied AV block -- Pertinent labwork reviewed earlier this admission includes:  -CMP (remarkable for lo K (2.6 this AM, now 3.2), hi glucose, and hi Cr),  -B12 (low at 114 pg/mL) -CBC w/ diff (remarkable for leukocytosis of 18.7) -TSH (wnl) -fT4 (slightly elevated at 1.19) -UA (remarkable for proteinuria and no evidence of infection) -UDS (positive for THC) -CT head w/o contrast (remarkable for mild age-related atrophy, chronic microvascular ischemic changes, and small old bilateral basal ganglia lacunar infarcts, with no acute intracranial hemorrhage, mass effect, or extra-axial fluid collection. -EKG (remarkable for atrial flutter with variable AV block) -MRI w/o contrast (remarkable for chronic small-vessel ischemic changes throughout the pons, few old small vessel cerebellar infarcts, chronic small-vessel ischemic changes of white matter in the cerebral hemispheres, and no large vessel teritory stroke)  ## Disposition:  -- admit for observation.   ## Behavioral / Environmental:  -- Endorses having multiple unsecured firearms at home -- Lives alone, but reports that his daughters live nearby and check on him frequently -- Daughter has no concerns for patient's safety at home  Substance use -Hx of EtOH use  disorder, in remission since around 2001  - 50py hx of smoking cigarettes, stopped in 2001 -Reports smoking marijuana every day, but reports that one joint will last him a week because "the stuff now is so strong".  Reports regular usage since 1973  ##Legal Status -- Patient is being held voluntarily  Thank you for this consult request. Recommendations have been communicated to the primary team.  We will continue to follow at this time.   Will Hoffert, MS4  History of Present Illness  Relevant Aspects of Hospital Course:  Admitted on 05/06/2022 for afib with RVR. Patient was brought in by his daughter to the [facility] ED on 3/10 for afib with RVR with reported "odd behavior" for a week. Per patient's family, patient has reportedly been acting differently for a week after an episode of expressive aphasia. Since this episode of aphasia, his family reports he has been acting differently, manifesting as slurred speech, word-finding difficulty, poor sleep, and delusions that family members are smearing feces across his house.   Patient Report:  Patient seen and assessed at bedside.  Patient alert and oriented to self and location but not time or context. He seems to have difficulties with attention and memory today.  He was unable to do DOWB.  He did state that he slept better last night with ramelteon.  He also stated that daughters came to visit him yesterday.  Discussed continuing current psychotropic medication regimen and he was agreeable.  ROS:  Patient was A+Ox4, and denies delusions, homicidal ideation, suicidal ideation, auditory hallucinations, and visual hallucinations. Patient's speech was clear.  Collateral information:  None today. Sitter reports patient being appropriate since her shift started this morning.  Psychiatric History:  Information collected from patient. He denied having any history of psychiatric illness or medications, but when directly asked, he confirmed that he is on wellbutrin and paxil, which he was prescribed for "nerves", which are effective. He denies seeing a therapist in the past, and reports only saw a psychiatrist a single time when he was applying for disability.    Social  History:   -Lives alone in a house, but his daughters frequently check up on him -He has multiple firearms at home, at least one of which is an unsecured shotgun -Employment: on disability  Tobacco use: stopped smoking "cold [place]" in 2001, but uses one can/week of chewing tobacco Alcohol use: stopped drinking "cold [place]" in 2001 Drug use: smokes one joint/week for joint pain and insomnia since 1973. Denies drug use otherwise  Family History:   The patient's family history includes Alzheimer's disease in his mother; CAD in his father.  Medical History: Past Medical History:  Diagnosis Date   Acute myocardial infarction of other anterior wall, initial episode of care 12/29/2012   Promus stent to LAD   Acute ST segment elevation MI ([place]) 01/05/2013   secondary to thrombus in stent; Brilintta changed to Effient pt with ASA allergy   Coronary artery disease 2001   stent to LAD and patent 2007 on cath   Echocardiogram abnormal 01/06/2013   EF [id number], grade I diastolic dysfunction   Erectile dysfunction    Hyperlipidemia LDL goal <70    Hypertension    OSA (obstructive sleep apnea)    No CPAP use overnight    Surgical History: Past Surgical History:  Procedure Laterality Date   BACK SURGERY  2007   Decompression lumbar laminectomy and micro discectomy, L4-5   BRONCHIAL BIOPSY  12/28/2021   Procedure: BRONCHIAL BIOPSIES;  Surgeon: Collene Gobble, MD;  Location: [facility] ENDOSCOPY;  Service: Pulmonary;;   BRONCHIAL BRUSHINGS  12/28/2021   Procedure: BRONCHIAL BRUSHINGS;  Surgeon: Collene Gobble, MD;  Location: [facility] ENDOSCOPY;  Service: Pulmonary;;   BRONCHIAL NEEDLE ASPIRATION BIOPSY  12/28/2021   Procedure: BRONCHIAL NEEDLE ASPIRATION BIOPSIES;  Surgeon: Collene Gobble, MD;  Location: [facility] ENDOSCOPY;  Service: Pulmonary;;   BRONCHIAL WASHINGS  12/28/2021   Procedure: BRONCHIAL WASHINGS;  Surgeon: Collene Gobble, MD;  Location: [facility] ENDOSCOPY;  Service: Pulmonary;;   CARDIAC  CATHETERIZATION  2007   patent stent to LAD and normal [place]   CHOLECYSTECTOMY N/A 12/26/2021   Procedure: LAPAROSCOPIC CHOLECYSTECTOMY, LAPAROSCOPIC LYSIS OF ADHESIONS GREATER THAN 1 HOUR;  Surgeon: [name], MD;  Location: [place];  Service: General;  Laterality: N/A;   CORONARY ANGIOPLASTY WITH STENT PLACEMENT  2001   to LAD   CORONARY ANGIOPLASTY WITH STENT PLACEMENT  12/29/12   STEMI with promus DES to LAD   CORONARY ANGIOPLASTY WITH STENT PLACEMENT  01/05/13   STEMI with thrombosis in stent to LAD   FIDUCIAL MARKER PLACEMENT  12/28/2021   Procedure: FIDUCIAL MARKER PLACEMENT;  Surgeon: Collene Gobble, MD;  Location: [facility] ENDOSCOPY;  Service: Pulmonary;;   LEFT HEART CATH N/A 12/29/2012   Procedure: LEFT HEART CATH;  Surgeon: Jettie Booze, MD;  Location: [facility] CATH LAB;  Service: Cardiovascular;  Laterality: N/A;   LEFT HEART CATHETERIZATION WITH CORONARY ANGIOGRAM N/A 01/05/2013   Procedure: LEFT HEART CATHETERIZATION WITH CORONARY ANGIOGRAM;  Surgeon: Blane Ohara, MD;  Location: [facility] CATH LAB;  Service: Cardiovascular;  Laterality: N/A;   LEFT HEART CATHETERIZATION WITH CORONARY ANGIOGRAM N/A 04/22/2013   Procedure: LEFT HEART CATHETERIZATION WITH CORONARY ANGIOGRAM;  Surgeon: Troy Sine, MD;  Location: [facility] CATH LAB;  Service: Cardiovascular;  Laterality: N/A;   PARTIAL NEPHRECTOMY Left 1975   PATIENT ONLY HAS ONE KIDNEY   PERCUTANEOUS CORONARY STENT INTERVENTION (PCI-S)  12/29/2012   Procedure: PERCUTANEOUS CORONARY STENT INTERVENTION (PCI-S);  Surgeon: Jettie Booze, MD;  Location: [facility] CATH LAB;  Service: Cardiovascular;;    Medications:   Current Facility-Administered Medications:    acetaminophen (TYLENOL) tablet 650 mg, 650 mg, Oral, Q6H PRN **OR** acetaminophen (TYLENOL) suppository 650 mg, 650 mg, Rectal, Q6H PRN, Claria Dice, Debby, MD   apixaban (ELIQUIS) tablet 5 mg, 5 mg, Oral, BID, Hilty, Nadean Corwin, MD, 5 mg at 05/08/22 1005   buPROPion (WELLBUTRIN XL) 24 hr  tablet 300 mg, 300 mg, Oral, Daily, France Ravens, MD, 300 mg at 05/08/22 1006   [COMPLETED] diltiazem (CARDIZEM) 1 mg/mL load via infusion 10 mg, 10 mg, Intravenous, Once, 10 mg at 05/06/22 1957 **AND** diltiazem (CARDIZEM) 125 mg in dextrose 5% 125 mL (1 mg/mL) infusion, 5-15 mg/hr, Intravenous, Continuous, Sage, Haley, PA-C, Last Rate: 10 mL/hr at 05/08/22 0735, 10 mg/hr at 05/08/22 0735   ondansetron (ZOFRAN) tablet 4 mg, 4 mg, Oral, Q6H PRN **OR** ondansetron (ZOFRAN) injection 4 mg, 4 mg, Intravenous, Q6H PRN, Crosley, Debby, MD   PARoxetine (PAXIL) tablet 30 mg, 30 mg, Oral, Daily, Crosley, Debby, MD, 30 mg at 05/08/22 1005   potassium chloride SA (KLOR-CON M) CR tablet 40 mEq, 40 mEq, Oral, Daily, Pokhrel, Laxman, MD, 40 mEq at 05/08/22 1005   prasugrel (EFFIENT) tablet 10 mg, 10 mg, Oral, Daily, Crosley, Debby, MD, 10 mg at 05/08/22 1005   QUEtiapine (SEROQUEL) tablet 25 mg, 25 mg, Oral, TID PRN, France Ravens, MD, 25 mg at 05/07/22 2119   ramelteon (ROZEREM)  tablet 8 mg, 8 mg, Oral, QHS, France Ravens, MD, 8 mg at 05/07/22 2232   rosuvastatin (CRESTOR) tablet 10 mg, 10 mg, Oral, Daily, Crosley, Debby, MD, 10 mg at 05/08/22 1005   senna-docusate (Senokot-S) tablet 1 tablet, 1 tablet, Oral, QHS PRN, Claria Dice, Debby, MD  Current Outpatient Medications:    buPROPion (WELLBUTRIN XL) 150 MG 24 hr tablet, Take 450 mg by mouth daily., Disp: , Rfl:    fluticasone (FLONASE) 50 MCG/ACT nasal spray, Place 1 spray into both nostrils daily., Disp: , Rfl:    furosemide (LASIX) 20 MG tablet, Take 1 tablet (20 mg total) by mouth daily., Disp: 30 tablet, Rfl: 5   lisinopril (ZESTRIL) 40 MG tablet, Take 40 mg by mouth daily., Disp: , Rfl:    nitroGLYCERIN (NITROSTAT) 0.4 MG SL tablet, Place 1 tablet (0.4 mg total) under the tongue every 5 (five) minutes x 3 doses as needed for chest pain. NEED OV., Disp: 25 tablet, Rfl: 0   PARoxetine (PAXIL) 30 MG tablet, Take 30 mg by mouth daily., Disp: , Rfl:    potassium  chloride (KLOR-CON M) 10 MEQ tablet, Take 10 mEq by mouth daily., Disp: , Rfl:    prasugrel (EFFIENT) 10 MG TABS tablet, Take 1 tablet (10 mg total) by mouth daily., Disp: 30 tablet, Rfl: 11   pregabalin (LYRICA) 150 MG capsule, Take 450 mg by mouth daily., Disp: , Rfl:    rosuvastatin (CRESTOR) 10 MG tablet, Take 10 mg by mouth daily., Disp: , Rfl:   Allergies: Allergies  Allergen Reactions   Levitra [Vardenafil] Other (See Comments)    Blurred vision   Aspirin Hives   Clopidogrel Other (See Comments)    Not effective anti platelet med for patient-switched to effient   Lipitor [Atorvastatin] Other (See Comments)    Muscle cramps; patient states he does not take medication at home       Objective  Vital signs:  Temp:  [97.9 F (36.6 C)-98.7 F (37.1 C)] 98.2 F (36.8 C) (03/12 0821) Pulse Rate:  [60-101] 73 (03/12 1130) Resp:  [10-23] 16 (03/12 1130) BP: (112-169)/(50-98) 145/76 (03/12 1130) SpO2:  [95 %-100 %] 99 % (03/12 1130)  Psychiatric Specialty Exam:  Presentation  General Appearance: Appropriate for Environment; Disheveled  Eye Contact:Good  Speech:Clear and Coherent; Normal Rate  Speech Volume:Normal  Handedness:Right   Mood and Affect  Mood:Euthymic  Affect:Congruent   Thought Process  Thought Processes:Coherent; Linear; Goal Directed  Descriptions of Associations: Intact  Orientation:Full (Time, Place and Person)  Thought Content:Logical   Hallucinations:Hallucinations: None  Ideas of Reference:None  Suicidal Thoughts:Suicidal Thoughts: No  Homicidal Thoughts:Homicidal Thoughts: No   Sensorium  Memory:Immediate Good; Recent Good; Remote Good  Judgment:Fair  Insight:Poor   Executive Functions  Concentration:Fair  Attention Span:Fair  [place]   Psychomotor Activity  Psychomotor Activity:Psychomotor Activity: Normal   Assets  Assets:Social Support   Sleep  Sleep:Sleep:  Poor    Physical Exam: Physical Exam Constitutional:      General: He is not in acute distress.    Appearance: Normal appearance. He is not ill-appearing.  HENT:     Head: Normocephalic and atraumatic.  Pulmonary:     Effort: Pulmonary effort is normal.  Musculoskeletal:     Cervical back: Normal range of motion.  Neurological:     Mental Status: He is alert and oriented to person, place, and time.  Psychiatric:        Mood and Affect: Mood normal.  Behavior: Behavior normal.        Thought Content: Thought content normal.    Review of Systems  Psychiatric/Behavioral:  Negative for depression, hallucinations and suicidal ideas. The patient has insomnia.    Blood pressure (!) 145/76, pulse 73, temperature 98.2 F (36.8 C), temperature source Oral, resp. rate 16, height '5\' 10"'$  (1.778 m), weight 102.1 kg, SpO2 99 %. Body mass index is 32.28 kg/m.   Attestation for Student Documentation:  I personally was present and performed or re-performed the history, physical exam and medical decision-making activities of this service and have verified that the service and findings are accurately documented in the student's note.  France Ravens, MD 05/08/2022, 12:09 PM

## 2022-05-08 NOTE — Progress Notes (Signed)
Secure chatted with Dr. Louanne Belton and he stated that patient does not meet criteria at this time for IVC. Sitter remains at bedside.

## 2022-05-08 NOTE — Progress Notes (Addendum)
PROGRESS NOTE    Brendan Butler  [id number] DOB: August 26, 1950 DOA: 05/06/2022 PCP: Wenda Low, MD    Brief Narrative:  Patient is a 72 year old male with past medical history of HTN, HLD, CAD, CHF, OSA not on CPAP presented to the hospital brought in by her family for acting differently for a week with intermittent episodes of slurred speech and word finding difficulty.  At nighttime, patient was having some delusional thoughts and beliefs.  In the ED, patient was not delusional.  He was noted to have atrial flutter with heart rate at 140s and Cardizem drip was initiated.    After admission, patient did have episodes of delirium and psychiatry was consulted.  Cardiology was consulted for atrial flutter with rapid ventricular response and out of Cardizem drip.  Psychiatry and cardiology following at this time.  Assessment and plan.  Typical atrial flutter with rapid ventricular response, 4:1 AV block Likely new onset.  On Cardizem drip.  Cardiology on board. TSH within normal range.  2D echocardiogram with LV ejection fraction of 55 to 60% with regional wall motion abnormality from previous anteroseptal LAD infarct.  Patient is on antiplatelet therapy with Effient due to history of failure of Plavix and allergy to aspirin.  Now has been started on Eliquis for anticoagulation.  Plan for TEE/DC cardioversion on Thursday after anticoagulation..  Hypokalemia Continue to replenish orally..  Potassium today at 3.3.  Acute alteration in mental status Sudden etiology but multifactorial.  CT head was normal.  Did have mild leukocytosis on presentation.  Chest x-ray without any infiltrate.  Urinalysis was negative for infection.  Ammonia level within normal range.  Vitamin B12 level at 114 and low.  In the context of altered mental status and low vitamin B12 will consider IM vitamin B12 1000 mcg daily for 7 days.  Could consider continuation of vitamin B12 on discharge.  Urine drug screen showed  THC.Folate within normal limits.  RPR was nonreactive.  HIV nonreactive.  MRI of the brain was negative for acute findings but small vessel chronic changes.  Seen and needed one-to-one sitter since he was found to be agitated at times and wandering.  Psychiatry on board and has been added on Seroquel as needed and ramelteon at nighttime.  Dose of bupropion was decreased.  Continue sitter at bedside.  Mood disorder/ History of depression, anxiety alcohol use disorder.  Slightly confused months, today..  Add vitamin B12.  Psychiatry on board.  Continue [place], Paxil, Seroquel, Wellbutrin.    Chronic leukocytosis Has been elevated since 2012.  Follows up with heme at oncology as outpatient.  Pulmonary nodules: Followed by pulmonology as outpatient.  Coronary artery disease/ H/o STEMIx2-continue Crestor, Effient.  No acute disease.  Dyslipidemia-continue Crestor  Stage 3a chronic kidney disease (CKD)-stable at this time.  Latest creatinine of 1.3.  History of sleep apnea.  Patient had refused wearing CPAP in the past.   DVT prophylaxis: On Lovenox. apixaban (ELIQUIS) tablet 5 mg   Code Status:     Code Status: Full Code  Disposition: Home likely in 2 to 3 days.  Plan for DC cardioversion, pending clinical improvement.  Status is: Inpatient  Remains inpatient appropriate because: Altered mental status, atrial flutter needing DC cardioversion,   Family Communication:  Spoke with the patient's daughter on the phone and updated her about the clinical condition of the patient.  Consultants:  Psychiatry Cardiology  Procedures:  None  Antimicrobials:  None  Anti-infectives (From admission, onward)    None  Subjective: Today, patient was seen and examined at bedside.  Patient appears to be much more calmer, calm and cooperative.  Denies any hallucinations today but little confused at times.   Objective: Vitals:   05/08/22 1214 05/08/22 1230 05/08/22 1300 05/08/22 1354   BP:  (!) 146/91 (!) 147/88 (!) 157/84  Pulse:  73 72 86  Resp:  '14 13 16  '$ Temp: 98.1 F (36.7 C)   98.6 F (37 C)  TempSrc: Oral   Oral  SpO2:  100% 100% 98%  Weight:    93.6 kg  Height:        Intake/Output Summary (Last 24 hours) at 05/08/2022 1605 Last data filed at 05/08/2022 1433 Gross per 24 hour  Intake 450 ml  Output 250 ml  Net 200 ml    Filed Weights   05/06/22 1822 05/08/22 1354  Weight: 102.1 kg 93.6 kg    Physical Examination:  Body mass index is 29.61 kg/m.   General: Obese built, not in obvious distress HENT:   No scleral pallor or icterus noted. Oral mucosa is moist.  Chest:  Clear breath sounds.  Diminished breath sounds bilaterally. No crackles or wheezes.  CVS: S1 &S2 heard. No murmur.  Regular rate and rhythm. Abdomen: Soft, nontender, nondistended.  Bowel sounds are heard.   Extremities: No cyanosis, clubbing or edema.  Peripheral pulses are palpable. Psych: Alert, awake and Communicative, CNS:  No cranial nerve deficits.  Power equal in all extremities.   Skin: Warm and dry.  No rashes noted.  Data Reviewed:   CBC: Recent Labs  Lab 05/06/22 1850 05/06/22 2207 05/07/22 0200 05/08/22 0516  WBC 17.5* 19.5* 18.7* 11.2*  NEUTROABS 11.8*  --  11.2*  --   HGB 15.8 15.2 13.6 12.6*  HCT 45.6 44.0 40.2 36.8*  MCV 83.4 85.1 85.2 85.2  PLT 256 257 238 213     Basic Metabolic Panel: Recent Labs  Lab 05/06/22 1850 05/06/22 2207 05/07/22 0200 05/07/22 0940 05/08/22 0516  NA 138  --  137 138 140  K 2.6*  --  2.6* 3.2* 3.3*  CL 101  --  104 104 108  CO2 25  --  23 21* 21*  GLUCOSE 118*  --  93 136* 108*  BUN 25*  --  '23 21 16  '$ CREATININE 1.98* 1.91* 1.72* 1.71* 1.34*  CALCIUM 8.8*  --  8.0* 8.9 8.6*  MG 2.1  --  2.0  --  1.8     Liver Function Tests: No results for input(s): "AST", "ALT", "ALKPHOS", "BILITOT", "PROT", "ALBUMIN" in the last 168 hours.   Radiology Studies: MR BRAIN WO CONTRAST  Result Date: 05/07/2022 CLINICAL  DATA:  Slurred speech EXAM: MRI HEAD WITHOUT CONTRAST TECHNIQUE: Multiplanar, multiecho pulse sequences of the brain and surrounding structures were obtained without intravenous contrast. COMPARISON:  Head CT yesterday FINDINGS: Brain: Diffusion imaging is normal. Chronic small-vessel ischemic changes are seen throughout the pons. Few old small vessel cerebellar infarctions. Cerebral hemispheres show moderate chronic small-vessel ischemic changes of the white matter. No large vessel territory stroke. No mass lesion, hemorrhage, hydrocephalus or extra-axial collection. Vascular: Major vessels at the base of the brain show flow. Skull and upper cervical spine: Negative Sinuses/Orbits: Clear/normal Other: None IMPRESSION: No acute finding. Moderate chronic small-vessel ischemic changes of the pons and cerebral hemispheric white matter. Electronically Signed   By: Nelson Chimes M.D.   On: 05/07/2022 13:39   CT Head Wo Contrast  Result Date: 05/06/2022 CLINICAL DATA:  Headache. EXAM: CT HEAD WITHOUT CONTRAST TECHNIQUE: Contiguous axial images were obtained from the base of the skull through the vertex without intravenous contrast. RADIATION DOSE REDUCTION: This exam was performed according to the departmental dose-optimization program which includes automated exposure control, adjustment of the mA and/or kV according to patient size and/or use of iterative reconstruction technique. COMPARISON:  Head CT dated 12/24/2021. FINDINGS: Brain: Mild age-related atrophy and chronic microvascular ischemic changes. Small old bilateral basal ganglia lacunar infarcts. There is no acute intracranial hemorrhage. No mass effect or midline shift. No extra-axial fluid collection. Vascular: No hyperdense vessel or unexpected calcification. Skull: Normal. Negative for fracture or focal lesion. Sinuses/Orbits: The visualized paranasal sinuses and the mastoid air cells are clear. Right mastoid effusions. No air-fluid level. Other: None  IMPRESSION: 1. No acute intracranial pathology. 2. Mild age-related atrophy and chronic microvascular ischemic changes. Small old bilateral basal ganglia lacunar infarcts. Electronically Signed   By: Anner Crete M.D.   On: 05/06/2022 20:00   DG Chest Portable 1 View  Result Date: 05/06/2022 CLINICAL DATA:  Shortness of breath EXAM: PORTABLE CHEST 1 VIEW COMPARISON:  Chest x-ray 12/28/2021 FINDINGS: The heart size and mediastinal contours are within normal limits. Both lungs are clear. The visualized skeletal structures are unremarkable. There surgical clips in the upper abdomen. IMPRESSION: No active disease. Electronically Signed   By: Ronney Asters M.D.   On: 05/06/2022 19:43      LOS: 2 days    Flora Lipps, MD [facility] Available via Epic secure chat 7am-7pm After these hours, please refer to coverage provider listed on [email or web address] 05/08/2022, 4:05 PM

## 2022-05-08 NOTE — Progress Notes (Signed)
DAILY PROGRESS NOTE   Patient Name: Brendan Butler Date of Encounter: 05/08/2022 Cardiologist: [name], MD  Chief Complaint   No complaints  Patient Profile   Brendan Butler is a 72 y.o. male with a hx of CAD s/p stent multiple interventions to LAD, CKD III, seizures, pulmonary nodules, HTN, HLD, chronic systolic CHF with preserved EF, and OSA  who is being seen 05/07/2022 for the evaluation of atrial flutter at the request of Dr. Louanne Belton.   Subjective   Brendan Butler is more calm today - quite cooperative and amenable to exam. He remains in atrial flutter with 4:1 AV block at 75. Not symptomatic.  Objective   Vitals:   05/08/22 0730 05/08/22 0800 05/08/22 0821 05/08/22 0830  BP: 131/69 (!) 141/80  138/82  Pulse:    72  Resp: 20 (!) 23  10  Temp:   98.2 F (36.8 C)   TempSrc:   Oral   SpO2:    100%  Weight:      Height:        Intake/Output Summary (Last 24 hours) at 05/08/2022 0855 Last data filed at 05/07/2022 1924 Gross per 24 hour  Intake --  Output 250 ml  Net -250 ml   Filed Weights   05/06/22 1822  Weight: 102.1 kg    Physical Exam   General appearance: alert and no distress Lungs: clear to auscultation bilaterally Heart: regular rate and rhythm Extremities: extremities normal, atraumatic, no cyanosis or edema Neurologic: Grossly normal  Inpatient Medications    Scheduled Meds:  apixaban  5 mg Oral BID   buPROPion  300 mg Oral Daily   PARoxetine  30 mg Oral Daily   potassium chloride  40 mEq Oral Daily   prasugrel  10 mg Oral Daily   ramelteon  8 mg Oral QHS   rosuvastatin  10 mg Oral Daily    Continuous Infusions:  diltiazem (CARDIZEM) infusion 10 mg/hr (05/08/22 0318)   lactated ringers Stopped (05/07/22 1720)    PRN Meds: acetaminophen **OR** acetaminophen, ondansetron **OR** ondansetron (ZOFRAN) IV, QUEtiapine, senna-docusate   Labs   Results for orders placed or performed during the hospital encounter of 05/06/22 (from  the past 48 hour(s))  CBC with Differential     Status: Abnormal   Collection Time: 05/06/22  6:50 PM  Result Value Ref Range   WBC 17.5 (H) 4.0 - 10.5 K/uL   RBC 5.47 4.22 - 5.81 MIL/uL   Hemoglobin 15.8 13.0 - 17.0 g/dL   HCT 45.6 39.0 - 52.0 %   MCV 83.4 80.0 - 100.0 fL   MCH 28.9 26.0 - 34.0 pg   MCHC 34.6 30.0 - 36.0 g/dL   RDW 12.7 11.5 - 15.5 %   Platelets 256 150 - 400 K/uL   nRBC 0.0 0.0 - 0.2 %   Neutrophils Relative % 68 %   Neutro Abs 11.8 (H) 1.7 - 7.7 K/uL   Lymphocytes Relative 21 %   Lymphs Abs 3.7 0.7 - 4.0 K/uL   Monocytes Relative 10 %   Monocytes Absolute 1.8 (H) 0.1 - 1.0 K/uL   Eosinophils Relative 1 %   Eosinophils Absolute 0.1 0.0 - 0.5 K/uL   Basophils Relative 0 %   Basophils Absolute 0.0 0.0 - 0.1 K/uL   Immature Granulocytes 0 %   Abs Immature Granulocytes 0.06 0.00 - 0.07 K/uL    Comment: Performed at [facility], [address], [place], [place] [address]  Basic  metabolic panel     Status: Abnormal   Collection Time: 05/06/22  6:50 PM  Result Value Ref Range   Sodium 138 135 - 145 mmol/L   Potassium 2.6 (LL) 3.5 - 5.1 mmol/L    Comment: CRITICAL RESULT CALLED TO, READ BACK BY AND VERIFIED WITH K,SWORD RN '@1958'$  05/06/22 E,BENTON   Chloride 101 98 - 111 mmol/L   CO2 25 22 - 32 mmol/L   Glucose, Bld 118 (H) 70 - 99 mg/dL    Comment: Glucose reference range applies only to samples taken after fasting for at least 8 hours.   BUN 25 (H) 8 - 23 mg/dL   Creatinine, Ser 1.98 (H) 0.61 - 1.24 mg/dL   Calcium 8.8 (L) 8.9 - 10.3 mg/dL   GFR, Estimated 36 (L) >60 mL/min    Comment: (NOTE) Calculated using the CKD-EPI Creatinine Equation (2021)    Anion gap 12 5 - 15    Comment: Performed at [facility] [address], [place], [place] [address]  Troponin I (High Sensitivity)     Status: Abnormal   Collection Time: 05/06/22  6:50 PM  Result Value Ref Range   Troponin I (High Sensitivity) 28 (H) <18 ng/L    Comment: (NOTE) Elevated high  sensitivity troponin I (hsTnI) values and significant  changes across serial measurements may suggest ACS but many other  chronic and acute conditions are known to elevate hsTnI results.  Refer to the "Links" section for chest pain algorithms and additional  guidance. Performed at [facility], [place] [address], [place], [place] [address]   Brain natriuretic peptide     Status: None   Collection Time: 05/06/22  6:50 PM  Result Value Ref Range   B Natriuretic Peptide 36.5 0.0 - 100.0 pg/mL    Comment: Performed at [facility] [address], [place], [place] [address]  TSH     Status: None   Collection Time: 05/06/22  6:50 PM  Result Value Ref Range   TSH 1.856 0.350 - 4.500 uIU/mL    Comment: Performed by a 3rd Generation assay with a functional sensitivity of <=0.01 uIU/mL. Performed at [facility], [address] [place], [place] [address]   T4, free     Status: Abnormal   Collection Time: 05/06/22  6:50 PM  Result Value Ref Range   Free T4 1.19 (H) 0.61 - 1.12 ng/dL    Comment: (NOTE) Biotin ingestion may interfere with free T4 tests. If the results are inconsistent with the TSH level, previous test results, or the clinical presentation, then consider biotin interference. If needed, order repeat testing after stopping biotin. Performed at [facility], [place] [address] [place], [place] [address]   Magnesium     Status: None   Collection Time: 05/06/22  6:50 PM  Result Value Ref Range   Magnesium 2.1 1.7 - 2.4 mg/dL    Comment: Performed at [facility] [address], [place],  [address]  Troponin I (High Sensitivity)     Status: Abnormal   Collection Time: 05/06/22  9:10 PM  Result Value Ref Range   Troponin I (High Sensitivity) 28 (H) <18 ng/L    Comment: (NOTE) Elevated high sensitivity troponin I (hsTnI) values and significant  changes across serial measurements may suggest ACS but many other  chronic and acute conditions are  known to elevate hsTnI results.  Refer to the "Links" section for chest pain algorithms and  additional  guidance. Performed at [facility], [place] [address] [place], [place] [address]   HIV Antibody (routine testing w rflx)     Status: None   Collection Time: 05/06/22 10:07 PM  Result Value Ref Range   HIV Screen 4th Generation wRfx Non Reactive Non Reactive    Comment: Performed at [facility], [place] [address] [place], [place] [address]  CBC     Status: Abnormal   Collection Time: 05/06/22 10:07 PM  Result Value Ref Range   WBC 19.5 (H) 4.0 - 10.5 K/uL   RBC 5.17 4.22 - 5.81 MIL/uL   Hemoglobin 15.2 13.0 - 17.0 g/dL   HCT 44.0 39.0 - 52.0 %   MCV 85.1 80.0 - 100.0 fL   MCH 29.4 26.0 - 34.0 pg   MCHC 34.5 30.0 - 36.0 g/dL   RDW 12.7 11.5 - 15.5 %   Platelets 257 150 - 400 K/uL   nRBC 0.0 0.0 - 0.2 %    Comment: Performed at [facility], [place] [address], [place], [place] [address]  Creatinine, serum     Status: Abnormal   Collection Time: 05/06/22 10:07 PM  Result Value Ref Range   Creatinine, Ser 1.91 (H) 0.61 - 1.24 mg/dL   GFR, Estimated 37 (L) >60 mL/min    Comment: (NOTE) Calculated using the CKD-EPI Creatinine Equation (2021) Performed at [facility] [address], [place], [place] [id number]   Basic metabolic panel     Status: Abnormal   Collection Time: 05/07/22  2:00 AM  Result Value Ref Range   Sodium 137 135 - 145 mmol/L   Potassium 2.6 (LL) 3.5 - 5.1 mmol/L    Comment: CRITICAL RESULT CALLED TO, READ BACK BY AND VERIFIED WITH B.SANGALANG,RN. 0310 05/07/22. LPAIT   Chloride 104 98 - 111 mmol/L   CO2 23 22 - 32 mmol/L   Glucose, Bld 93 70 - 99 mg/dL    Comment: Glucose reference range applies only to samples taken after fasting for at least 8 hours.   BUN 23 8 - 23 mg/dL   Creatinine, Ser 1.72 (H) 0.61 - 1.24 mg/dL   Calcium 8.0 (L) 8.9 - 10.3 mg/dL   GFR, Estimated 42 (L) >60 mL/min    Comment: (NOTE) Calculated using the CKD-EPI  Creatinine Equation (2021)    Anion gap 10 5 - 15    Comment: Performed at [facility] [address] [place], [place] [address]  Magnesium     Status: None   Collection Time: 05/07/22  2:00 AM  Result Value Ref Range   Magnesium 2.0 1.7 - 2.4 mg/dL    Comment: Performed at [facility], [place] [address], [place], [place] [address]  CBC with Differential/Platelet     Status: Abnormal   Collection Time: 05/07/22  2:00 AM  Result Value Ref Range   WBC 18.7 (H) 4.0 - 10.5 K/uL   RBC 4.72 4.22 - 5.81 MIL/uL   Hemoglobin 13.6 13.0 - 17.0 g/dL   HCT 40.2 39.0 - 52.0 %   MCV 85.2 80.0 - 100.0 fL   MCH 28.8 26.0 - 34.0 pg   MCHC 33.8 30.0 - 36.0 g/dL   RDW 12.8 11.5 - 15.5 %   Platelets 238 150 - 400 K/uL   nRBC 0.0 0.0 - 0.2 %   Neutrophils Relative % 59 %   Neutro Abs 11.2 (H) 1.7 - 7.7 K/uL   Lymphocytes Relative  28 %   Lymphs Abs 5.2 (H) 0.7 - 4.0 K/uL   Monocytes Relative 11 %   Monocytes Absolute 2.0 (H) 0.1 - 1.0 K/uL   Eosinophils Relative 1 %   Eosinophils Absolute 0.1 0.0 - 0.5 K/uL   Basophils Relative 0 %   Basophils Absolute 0.1 0.0 - 0.1 K/uL   Immature Granulocytes 1 %   Abs Immature Granulocytes 0.09 (H) 0.00 - 0.07 K/uL    Comment: Performed at [facility] [address], [place], [place] [address]  Folate     Status: None   Collection Time: 05/07/22  2:00 AM  Result Value Ref Range   Folate 34.8 >5.9 ng/mL    Comment: Performed at [facility] [address] [place], [place] [address]  Vitamin B12     Status: Abnormal   Collection Time: 05/07/22  2:35 AM  Result Value Ref Range   Vitamin B-12 114 (L) 180 - 914 pg/mL    Comment: (NOTE) This assay is not validated for testing neonatal or myeloproliferative syndrome specimens for Vitamin B12 levels. Performed at [facility], [place] [address] [place], [place] [address]   RPR     Status: None   Collection Time: 05/07/22  2:35 AM  Result Value Ref Range   RPR Ser Ql NON  REACTIVE NON REACTIVE    Comment: Performed at [facility], [place] [address], [place], [place] [address]  Ammonia     Status: None   Collection Time: 05/07/22  2:35 AM  Result Value Ref Range   Ammonia 34 9 - 35 umol/L    Comment: Performed at [facility], [place] [address], [place], [place] [address]  Rapid urine drug screen (hospital performed)     Status: Abnormal   Collection Time: 05/07/22  3:07 AM  Result Value Ref Range   Opiates NONE DETECTED NONE DETECTED   Cocaine NONE DETECTED NONE DETECTED   Benzodiazepines NONE DETECTED NONE DETECTED   Amphetamines NONE DETECTED NONE DETECTED   Tetrahydrocannabinol POSITIVE (A) NONE DETECTED   Barbiturates NONE DETECTED NONE DETECTED    Comment: (NOTE) DRUG SCREEN FOR MEDICAL PURPOSES ONLY.  IF CONFIRMATION IS NEEDED FOR ANY PURPOSE, NOTIFY LAB WITHIN 5 DAYS.  LOWEST DETECTABLE LIMITS FOR URINE DRUG SCREEN Drug Class                     Cutoff (ng/mL) Amphetamine and metabolites    1000 Barbiturate and metabolites    200 Benzodiazepine                 200 Opiates and metabolites        300 Cocaine and metabolites        300 THC                            50 Performed at [facility], [place] [address] [place], [place] [address]   Urinalysis, Complete w Microscopic -Urine, Clean Catch     Status: Abnormal   Collection Time: 05/07/22  3:07 AM  Result Value Ref Range   Color, Urine YELLOW YELLOW   APPearance CLEAR CLEAR   Specific Gravity, Urine 1.014 1.005 - 1.030   pH 5.0 5.0 - 8.0   Glucose, UA NEGATIVE NEGATIVE mg/dL   Hgb urine dipstick NEGATIVE NEGATIVE   Bilirubin Urine NEGATIVE NEGATIVE   Ketones, ur NEGATIVE NEGATIVE mg/dL  Protein, ur 30 (A) NEGATIVE mg/dL   Nitrite NEGATIVE NEGATIVE   Leukocytes,Ua NEGATIVE NEGATIVE   RBC / HPF 0-5 0 - 5 RBC/hpf   WBC, UA 0-5 0 - 5 WBC/hpf   Bacteria, UA NONE SEEN NONE SEEN   Squamous Epithelial / HPF 0-5 0 - 5 /HPF   Mucus PRESENT    Hyaline Casts, UA PRESENT      Comment: Performed at [facility], [place] [address] [place], [place] [id number]  Basic metabolic panel     Status: Abnormal   Collection Time: 05/07/22  9:40 AM  Result Value Ref Range   Sodium 138 135 - 145 mmol/L   Potassium 3.2 (L) 3.5 - 5.1 mmol/L   Chloride 104 98 - 111 mmol/L   CO2 21 (L) 22 - 32 mmol/L   Glucose, Bld 136 (H) 70 - 99 mg/dL    Comment: Glucose reference range applies only to samples taken after fasting for at least 8 hours.   BUN 21 8 - 23 mg/dL   Creatinine, Ser 1.71 (H) 0.61 - 1.24 mg/dL   Calcium 8.9 8.9 - 10.3 mg/dL   GFR, Estimated 43 (L) >60 mL/min    Comment: (NOTE) Calculated using the CKD-EPI Creatinine Equation (2021)    Anion gap 13 5 - 15    Comment: Performed at [facility] [address], [place], [place] [id number]  Basic metabolic panel     Status: Abnormal   Collection Time: 05/08/22  5:16 AM  Result Value Ref Range   Sodium 140 135 - 145 mmol/L   Potassium 3.3 (L) 3.5 - 5.1 mmol/L   Chloride 108 98 - 111 mmol/L   CO2 21 (L) 22 - 32 mmol/L   Glucose, Bld 108 (H) 70 - 99 mg/dL    Comment: Glucose reference range applies only to samples taken after fasting for at least 8 hours.   BUN 16 8 - 23 mg/dL   Creatinine, Ser 1.34 (H) 0.61 - 1.24 mg/dL   Calcium 8.6 (L) 8.9 - 10.3 mg/dL   GFR, Estimated 57 (L) >60 mL/min    Comment: (NOTE) Calculated using the CKD-EPI Creatinine Equation (2021)    Anion gap 11 5 - 15    Comment: Performed at [facility] [address], [place], [place] [address]  CBC     Status: Abnormal   Collection Time: 05/08/22  5:16 AM  Result Value Ref Range   WBC 11.2 (H) 4.0 - 10.5 K/uL   RBC 4.32 4.22 - 5.81 MIL/uL   Hemoglobin 12.6 (L) 13.0 - 17.0 g/dL   HCT 36.8 (L) 39.0 - 52.0 %   MCV 85.2 80.0 - 100.0 fL   MCH 29.2 26.0 - 34.0 pg   MCHC 34.2 30.0 - 36.0 g/dL   RDW 12.9 11.5 - 15.5 %   Platelets 213 150 - 400 K/uL   nRBC 0.0 0.0 - 0.2 %    Comment: Performed at [facility],  [place] [address] [place], [place] [address]  Magnesium     Status: None   Collection Time: 05/08/22  5:16 AM  Result Value Ref Range   Magnesium 1.8 1.7 - 2.4 mg/dL    Comment: Performed at [facility] [address], [place], [place] [address]    ECG   Typical atrial flutter with 4:1 AV block - Personally Reviewed  Telemetry   Atrial flutter - Personally Reviewed  Radiology    MR  BRAIN WO CONTRAST  Result Date: 05/07/2022 CLINICAL DATA:  Slurred speech EXAM: MRI HEAD WITHOUT CONTRAST TECHNIQUE: Multiplanar, multiecho pulse sequences of the brain and surrounding structures were obtained without intravenous contrast. COMPARISON:  Head CT yesterday FINDINGS: Brain: Diffusion imaging is normal. Chronic small-vessel ischemic changes are seen throughout the pons. Few old small vessel cerebellar infarctions. Cerebral hemispheres show moderate chronic small-vessel ischemic changes of the white matter. No large vessel territory stroke. No mass lesion, hemorrhage, hydrocephalus or extra-axial collection. Vascular: Major vessels at the base of the brain show flow. Skull and upper cervical spine: Negative Sinuses/Orbits: Clear/normal Other: None IMPRESSION: No acute finding. Moderate chronic small-vessel ischemic changes of the pons and cerebral hemispheric white matter. Electronically Signed   By: Nelson Chimes M.D.   On: 05/07/2022 13:39   CT Head Wo Contrast  Result Date: 05/06/2022 CLINICAL DATA:  Headache. EXAM: CT HEAD WITHOUT CONTRAST TECHNIQUE: Contiguous axial images were obtained from the base of the skull through the vertex without intravenous contrast. RADIATION DOSE REDUCTION: This exam was performed according to the departmental dose-optimization program which includes automated exposure control, adjustment of the mA and/or kV according to patient size and/or use of iterative reconstruction technique. COMPARISON:  Head CT dated 12/24/2021. FINDINGS: Brain: Mild age-related atrophy and  chronic microvascular ischemic changes. Small old bilateral basal ganglia lacunar infarcts. There is no acute intracranial hemorrhage. No mass effect or midline shift. No extra-axial fluid collection. Vascular: No hyperdense vessel or unexpected calcification. Skull: Normal. Negative for fracture or focal lesion. Sinuses/Orbits: The visualized paranasal sinuses and the mastoid air cells are clear. Right mastoid effusions. No air-fluid level. Other: None IMPRESSION: 1. No acute intracranial pathology. 2. Mild age-related atrophy and chronic microvascular ischemic changes. Small old bilateral basal ganglia lacunar infarcts. Electronically Signed   By: Anner Crete M.D.   On: 05/06/2022 20:00   DG Chest Portable 1 View  Result Date: 05/06/2022 CLINICAL DATA:  Shortness of breath EXAM: PORTABLE CHEST 1 VIEW COMPARISON:  Chest x-ray 12/28/2021 FINDINGS: The heart size and mediastinal contours are within normal limits. Both lungs are clear. The visualized skeletal structures are unremarkable. There surgical clips in the upper abdomen. IMPRESSION: No active disease. Electronically Signed   By: Ronney Asters M.D.   On: 05/06/2022 19:43    Cardiac Studies   N/A  Assessment   Principal Problem:   Atrial flutter with rapid ventricular response (HCC) Active Problems:   Dyslipidemia   Essential hypertension   Coronary artery disease involving native coronary artery of native heart without angina pectoris   OSA on CPAP   Stage 3a chronic kidney disease (CKD) (HCC)   Bronchiectasis (HCC)   Hypokalemia   Altered mental status   Plan   Mr. Gwynn is in typical atrial flutter now with 4:1 AV block on diltiazem. He is asymptomatic with this. Will require anticoagulation, but is already on lifelong antiplatelet therapy with Effient (due to history of failure of Plavix and allergy to aspirin). Will have to accept a short course of antiplatelet + anticoagulant. May be a candidate for atrial flutter  ablation in the future. Switch lovenox over to Eliquis 5 mg BID (creatinine improved to 1.3 today). Will need 5 doses to reach steady state - we have arranged for Brendan/DCCV on Thursday. May be able to switch to oral diltiazem tomorrow if he remains in stable flutter that is rate-controlled.  Time Spent Directly with Patient:  I have spent a total of 25 minutes with the patient reviewing  hospital notes, telemetry, EKGs, labs and examining the patient as well as establishing an assessment and plan that was discussed personally with the patient.  > 50% of time was spent in direct patient care.  Length of Stay:  LOS: 2 days   Pixie Casino, MD, [facility], [facility] Director of the [facility] Lipid Disorders &  Cardiovascular Risk Reduction Clinic Diplomate of the American Board of Clinical Lipidology Attending Cardiologist  Direct Dial: [phone]  Fax: [phone]  Website:  [email or web address] 05/08/2022, 8:55 AM

## 2022-05-08 NOTE — Consult Note (Incomplete)
[facility] {[email or web address]:[id number]} Face-to-Face Psychiatric Evaluation   Service Date: May 08, 2022 LOS:  LOS: 2 days    Assessment  Brendan Butler is a 72 y.o. male admitted medically for 05/06/2022  6:18 PM for altered mental status. He carries the psychiatric diagnoses of depression, anxiety, and alcohol use disorder (in remission) and has a past medical history of OSA not on CPAP, hypertension, hyperlipidemia, CAD, CHF. Psychiatry was consulted for delusional thoughts and AMS by Sherrill Raring, PA-C.    His current presentation of *** is most consistent with ***. He meets criteria for *** based on ***.  Current outpatient psychotropic medications include *** and historically he has had a *** response to these medications. He was *** compliant with medications prior to admission as evidenced by ***. On initial examination, patient ***. Please see plan below for detailed recommendations.   Diagnoses:  Active Hospital problems: Principal Problem:   Atrial flutter with rapid ventricular response (HCC) Active Problems:   Dyslipidemia   Essential hypertension   Coronary artery disease involving native coronary artery of native heart without angina pectoris   OSA on CPAP   Stage 3a chronic kidney disease (CKD) (HCC)   Bronchiectasis (HCC)   Hypokalemia   Altered mental status     Plan  ## Safety and Observation Level:  - Based on my clinical evaluation, I estimate the patient to be at low risk of self harm in the current setting -    ## Medications:  -- *** ***  ## Medical Decision Making Capacity:  ***  ## Further Work-up:  -- *** {Generic and Specific Work-Up Recs:65844}   -- most recent EKG on *** had QtC of *** -- Pertinent labwork reviewed earlier this admission includes: ***  ## Disposition:  -- ***  ## Behavioral / Environmental:  -- ***  ##Legal Status   Thank you for this consult request. Recommendations have been communicated to the  primary team.  We Alayjah Boehringer *** at this time.   Ramani Riva, Medical Student   ***NEW vs followup history***  Relevant Aspects of Hospital Course:  Admitted on 05/06/2022 for ***.  Patient Report:  ***  ROS:  ***  Collateral information:  ***  Psychiatric History:  Information collected from ***  Family psych history: ***   Social History:  {Social History SELECT ALL THAT MD:[id number]  Tobacco use: *** Alcohol use: *** Drug use: ***  Family History:  {PSYHX:61224} The patient's family history includes Alzheimer's disease in his mother; CAD in his father.  Medical History: Past Medical History:  Diagnosis Date  . Acute myocardial infarction of other anterior wall, initial episode of care 12/29/2012   Promus stent to LAD  . Acute ST segment elevation MI ([place]) 01/05/2013   secondary to thrombus in stent; Brilintta changed to Effient pt with ASA allergy  . Coronary artery disease 2001   stent to LAD and patent 2007 on cath  . Echocardiogram abnormal 01/06/2013   EF [id number], grade I diastolic dysfunction  . Erectile dysfunction   . Hyperlipidemia LDL goal <70   . Hypertension   . OSA (obstructive sleep apnea)    No CPAP use overnight    Surgical History: Past Surgical History:  Procedure Laterality Date  . BACK SURGERY  2007   Decompression lumbar laminectomy and micro discectomy, L4-5  . BRONCHIAL BIOPSY  12/28/2021   Procedure: BRONCHIAL BIOPSIES;  Surgeon: Collene Gobble, MD;  Location: [facility] ENDOSCOPY;  Service: Pulmonary;;  . BRONCHIAL BRUSHINGS  12/28/2021   Procedure: BRONCHIAL BRUSHINGS;  Surgeon: Collene Gobble, MD;  Location: [facility] ENDOSCOPY;  Service: Pulmonary;;  . BRONCHIAL NEEDLE ASPIRATION BIOPSY  12/28/2021   Procedure: BRONCHIAL NEEDLE ASPIRATION BIOPSIES;  Surgeon: Collene Gobble, MD;  Location: [facility] ENDOSCOPY;  Service: Pulmonary;;  . BRONCHIAL WASHINGS  12/28/2021   Procedure: BRONCHIAL WASHINGS;  Surgeon: Collene Gobble, MD;  Location: [facility] ENDOSCOPY;   Service: Pulmonary;;  . CARDIAC CATHETERIZATION  2007   patent stent to LAD and normal [place]  . CHOLECYSTECTOMY N/A 12/26/2021   Procedure: LAPAROSCOPIC CHOLECYSTECTOMY, LAPAROSCOPIC LYSIS OF ADHESIONS GREATER THAN 1 HOUR;  Surgeon: [name], MD;  Location: [place];  Service: General;  Laterality: N/A;  . CORONARY ANGIOPLASTY WITH STENT PLACEMENT  2001   to LAD  . CORONARY ANGIOPLASTY WITH STENT PLACEMENT  12/29/12   STEMI with promus DES to LAD  . CORONARY ANGIOPLASTY WITH STENT PLACEMENT  01/05/13   STEMI with thrombosis in stent to LAD  . FIDUCIAL MARKER PLACEMENT  12/28/2021   Procedure: FIDUCIAL MARKER PLACEMENT;  Surgeon: Collene Gobble, MD;  Location: [facility] ENDOSCOPY;  Service: Pulmonary;;  . LEFT HEART CATH N/A 12/29/2012   Procedure: LEFT HEART CATH;  Surgeon: Jettie Booze, MD;  Location: [facility] CATH LAB;  Service: Cardiovascular;  Laterality: N/A;  . LEFT HEART CATHETERIZATION WITH CORONARY ANGIOGRAM N/A 01/05/2013   Procedure: LEFT HEART CATHETERIZATION WITH CORONARY ANGIOGRAM;  Surgeon: Blane Ohara, MD;  Location: [facility] CATH LAB;  Service: Cardiovascular;  Laterality: N/A;  . LEFT HEART CATHETERIZATION WITH CORONARY ANGIOGRAM N/A 04/22/2013   Procedure: LEFT HEART CATHETERIZATION WITH CORONARY ANGIOGRAM;  Surgeon: Troy Sine, MD;  Location: [facility] CATH LAB;  Service: Cardiovascular;  Laterality: N/A;  . PARTIAL NEPHRECTOMY Left 1975   PATIENT ONLY HAS ONE KIDNEY  . PERCUTANEOUS CORONARY STENT INTERVENTION (PCI-S)  12/29/2012   Procedure: PERCUTANEOUS CORONARY STENT INTERVENTION (PCI-S);  Surgeon: Jettie Booze, MD;  Location: [facility] CATH LAB;  Service: Cardiovascular;;    Medications:   Current Facility-Administered Medications:  .  acetaminophen (TYLENOL) tablet 650 mg, 650 mg, Oral, Q6H PRN **OR** acetaminophen (TYLENOL) suppository 650 mg, 650 mg, Rectal, Q6H PRN, Crosley, Debby, MD .  apixaban (ELIQUIS) tablet 5 mg, 5 mg, Oral, BID, Hilty, Nadean Corwin, MD, 5 mg at  05/08/22 1005 .  buPROPion (WELLBUTRIN XL) 24 hr tablet 300 mg, 300 mg, Oral, Daily, France Ravens, MD, 300 mg at 05/08/22 1006 .  [COMPLETED] diltiazem (CARDIZEM) 1 mg/mL load via infusion 10 mg, 10 mg, Intravenous, Once, 10 mg at 05/06/22 1957 **AND** diltiazem (CARDIZEM) 125 mg in dextrose 5% 125 mL (1 mg/mL) infusion, 5-15 mg/hr, Intravenous, Continuous, Sherrill Raring, PA-C, Last Rate: 10 mL/hr at 05/08/22 0735, 10 mg/hr at 05/08/22 0735 .  ondansetron (ZOFRAN) tablet 4 mg, 4 mg, Oral, Q6H PRN **OR** ondansetron (ZOFRAN) injection 4 mg, 4 mg, Intravenous, Q6H PRN, Crosley, Debby, MD .  PARoxetine (PAXIL) tablet 30 mg, 30 mg, Oral, Daily, Crosley, Debby, MD, 30 mg at 05/08/22 1005 .  potassium chloride SA (KLOR-CON M) CR tablet 40 mEq, 40 mEq, Oral, Daily, Pokhrel, Laxman, MD, 40 mEq at 05/08/22 1005 .  prasugrel (EFFIENT) tablet 10 mg, 10 mg, Oral, Daily, Crosley, Debby, MD, 10 mg at 05/08/22 1005 .  QUEtiapine (SEROQUEL) tablet 25 mg, 25 mg, Oral, TID PRN, France Ravens, MD, 25 mg at 05/07/22 2119 .  ramelteon (ROZEREM) tablet 8 mg, 8 mg, Oral, QHS, France Ravens, MD, 8 mg at 05/07/22 2232 .  rosuvastatin (CRESTOR) tablet 10 mg, 10 mg, Oral, Daily, Crosley, Debby, MD, 10 mg at 05/08/22 1005 .  senna-docusate (Senokot-S) tablet 1 tablet, 1 tablet, Oral, QHS PRN, Quintella Baton, MD  Current Outpatient Medications:  .  buPROPion (WELLBUTRIN XL) 150 MG 24 hr tablet, Take 450 mg by mouth daily., Disp: , Rfl:  .  fluticasone (FLONASE) 50 MCG/ACT nasal spray, Place 1 spray into both nostrils daily., Disp: , Rfl:  .  furosemide (LASIX) 20 MG tablet, Take 1 tablet (20 mg total) by mouth daily., Disp: 30 tablet, Rfl: 5 .  lisinopril (ZESTRIL) 40 MG tablet, Take 40 mg by mouth daily., Disp: , Rfl:  .  nitroGLYCERIN (NITROSTAT) 0.4 MG SL tablet, Place 1 tablet (0.4 mg total) under the tongue every 5 (five) minutes x 3 doses as needed for chest pain. NEED OV., Disp: 25 tablet, Rfl: 0 .  PARoxetine (PAXIL) 30 MG  tablet, Take 30 mg by mouth daily., Disp: , Rfl:  .  potassium chloride (KLOR-CON M) 10 MEQ tablet, Take 10 mEq by mouth daily., Disp: , Rfl:  .  prasugrel (EFFIENT) 10 MG TABS tablet, Take 1 tablet (10 mg total) by mouth daily., Disp: 30 tablet, Rfl: 11 .  pregabalin (LYRICA) 150 MG capsule, Take 450 mg by mouth daily., Disp: , Rfl:  .  rosuvastatin (CRESTOR) 10 MG tablet, Take 10 mg by mouth daily., Disp: , Rfl:   Allergies: Allergies  Allergen Reactions  . Levitra [Vardenafil] Other (See Comments)    Blurred vision  . Aspirin Hives  . Clopidogrel Other (See Comments)    Not effective anti platelet med for patient-switched to effient  . Lipitor [Atorvastatin] Other (See Comments)    Muscle cramps; patient states he does not take medication at home       Objective  Vital signs:  Temp:  [97.9 F (36.6 C)-98.7 F (37.1 C)] 98.2 F (36.8 C) (03/12 0821) Pulse Rate:  [60-101] 95 (03/12 1030) Resp:  [10-23] 19 (03/12 1030) BP: (119-169)/(50-98) 142/98 (03/12 1030) SpO2:  [95 %-100 %] 98 % (03/12 1030)  Psychiatric Specialty Exam:  Presentation  General Appearance:  Appropriate for Environment; Disheveled  Eye Contact: Good  Speech: Clear and Coherent; Normal Rate  Speech Volume: Normal  Handedness: Right   Mood and Affect  Mood: Euthymic  Affect: Congruent   Thought Process  Thought Processes: Coherent; Linear; Goal Directed  Descriptions of Associations:Loose  Orientation:Full (Time, Place and Person)  Thought Content:Logical  History of Schizophrenia/Schizoaffective disorder:No data recorded Duration of Psychotic Symptoms:No data recorded Hallucinations:Hallucinations: None  Ideas of Reference:None  Suicidal Thoughts:Suicidal Thoughts: No  Homicidal Thoughts:Homicidal Thoughts: No   Sensorium  Memory: Immediate Good; Recent Good; Remote Good  Judgment: Fair  Insight: Poor   Executive Functions  Concentration: Fair  Attention  Span: Fair  Recall: [place] of Knowledge: Fair  Language: Fair   Psychomotor Activity  Psychomotor Activity: Psychomotor Activity: Normal   Assets  Assets: Social Support   Sleep  Sleep: Sleep: Poor    Physical Exam: Physical Exam ROS Blood pressure (!) 142/98, pulse 95, temperature 98.2 F (36.8 C), temperature source Oral, resp. rate 19, height '5\' 10"'$  (1.778 m), weight 102.1 kg, SpO2 98 %. Body mass index is 32.28 kg/m.

## 2022-05-09 ENCOUNTER — Other Ambulatory Visit (HOSPITAL_COMMUNITY): Payer: Self-pay

## 2022-05-09 DIAGNOSIS — N1831 Chronic kidney disease, stage 3a: Secondary | ICD-10-CM | POA: Diagnosis not present

## 2022-05-09 DIAGNOSIS — I4892 Unspecified atrial flutter: Secondary | ICD-10-CM | POA: Diagnosis not present

## 2022-05-09 DIAGNOSIS — G4733 Obstructive sleep apnea (adult) (pediatric): Secondary | ICD-10-CM | POA: Diagnosis not present

## 2022-05-09 DIAGNOSIS — I2510 Atherosclerotic heart disease of native coronary artery without angina pectoris: Secondary | ICD-10-CM | POA: Diagnosis not present

## 2022-05-09 LAB — CBC
HCT: 37.7 % — ABNORMAL LOW (ref 39.0–52.0)
Hemoglobin: 12.9 g/dL — ABNORMAL LOW (ref 13.0–17.0)
MCH: 29.1 pg (ref 26.0–34.0)
MCHC: 34.2 g/dL (ref 30.0–36.0)
MCV: 84.9 fL (ref 80.0–100.0)
Platelets: 217 10*3/uL (ref 150–400)
RBC: 4.44 MIL/uL (ref 4.22–5.81)
RDW: 13 % (ref 11.5–15.5)
WBC: 10.2 10*3/uL (ref 4.0–10.5)
nRBC: 0 % (ref 0.0–0.2)

## 2022-05-09 LAB — PROTIME-INR
INR: 1.1 (ref 0.8–1.2)
Prothrombin Time: 14.5 seconds (ref 11.4–15.2)

## 2022-05-09 LAB — BASIC METABOLIC PANEL
Anion gap: 9 (ref 5–15)
BUN: 12 mg/dL (ref 8–23)
CO2: 26 mmol/L (ref 22–32)
Calcium: 8.9 mg/dL (ref 8.9–10.3)
Chloride: 104 mmol/L (ref 98–111)
Creatinine, Ser: 1.32 mg/dL — ABNORMAL HIGH (ref 0.61–1.24)
GFR, Estimated: 58 mL/min — ABNORMAL LOW (ref >60)
Glucose, Bld: 125 mg/dL — ABNORMAL HIGH (ref 70–99)
Potassium: 3.6 mmol/L (ref 3.5–5.1)
Sodium: 139 mmol/L (ref 135–145)

## 2022-05-09 LAB — MAGNESIUM: Magnesium: 1.8 mg/dL (ref 1.7–2.4)

## 2022-05-09 MED ORDER — PANTOPRAZOLE SODIUM 40 MG PO TBEC
40.0000 mg | DELAYED_RELEASE_TABLET | Freq: Every day | ORAL | Status: DC
  Administered 2022-05-09 – 2022-05-10 (×2): 40 mg via ORAL
  Filled 2022-05-09 (×2): qty 1

## 2022-05-09 MED ORDER — SODIUM CHLORIDE 0.9 % IV SOLN: INTRAVENOUS | Status: DC

## 2022-05-09 NOTE — Progress Notes (Signed)
Patient is oriented to person, place and time. Patient is disoriented to situation. After education, patient continue to have needs for reinforcement. Patient received

## 2022-05-09 NOTE — Progress Notes (Signed)
   Plan is for TEE/DCCV tomorrow for atrial flutter. Patient presented with altered mental status and still has some confusion. Called and spoke with daughter Posey Pronto and reviewed risks and benefits of procedure. The risks [stroke, cardiac arrhythmias rarely resulting in the need for a temporary or permanent pacemaker, skin irritation or burns, esophageal damage, perforation (1:10,000 risk), bleeding, pharyngeal hematoma as well as other potential complications associated with conscious sedation including aspiration, arrhythmia, respiratory failure and death], benefits (treatment guidance, restoration of normal sinus rhythm, diagnostic support) and alternatives of a transesophageal echocardiogram guided cardioversion were discussed in detail with daughter and she agrees to proceed. Dayna Dunn PA-C was present and confirmed telephone consent. We both signed consent. This will be placed in paper chart.  Darreld Mclean, PA-C 05/09/2022 10:38 AM

## 2022-05-09 NOTE — Progress Notes (Signed)
PROGRESS NOTE    Brendan Butler  [id number] DOB: 08/27/1950 DOA: 05/06/2022 PCP: Wenda Low, MD    Brief Narrative:  Patient is a 72 year old male with past medical history of HTN, HLD, CAD, CHF, OSA not on CPAP presented to the hospital brought in by her family for acting differently for a week with intermittent episodes of slurred speech and word finding difficulty.  At nighttime, patient was having some delusional thoughts and beliefs.  In the ED, patient was not delusional.  He was noted to have atrial flutter with heart rate at 140s and Cardizem drip was initiated.    After admission, patient did have episodes of delirium and psychiatry was consulted.  Cardiology was consulted for atrial flutter with rapid ventricular response and out of Cardizem drip.  Psychiatry and cardiology following at this time.  Patient appears to be better, still on Cardizem drip.  Plan for DC cardioversion 05/10/2022.  Assessment and plan.  Typical atrial flutter with rapid ventricular response, 4:1 AV block  On Cardizem drip.  Cardiology on board. TSH within normal range.  2D echocardiogram with LV ejection fraction of 55 to 60% with regional wall motion abnormality from previous anteroseptal LAD infarct.  Patient is on antiplatelet therapy with Effient due to history of failure of Plavix and allergy to aspirin.  Now has been started on Eliquis for anticoagulation.  Plan for TEE/DC cardioversion on Thursday, 05/10/2022, after anticoagulation.  Hypokalemia Improved after replacement.  Latest potassium of 3.6  Acute alteration in mental status Sudden etiology but multifactorial.  CT head was normal.  Did have mild leukocytosis on presentation.  Chest x-ray without any infiltrate.  Urinalysis was negative for infection.  Ammonia level within normal range.  Vitamin B12 level at 114 and low.  In the context of altered mental status and low vitamin B12 will consider IM vitamin B12 1000 mcg daily for 7 days.   Will consider continuation of vitamin B12 on discharge.  Urine drug screen showed THC.Folate within normal limits.  RPR was nonreactive.  HIV nonreactive.  MRI of the brain was negative for acute findings but small vessel chronic changes.Marland Kitchen  Psychiatry on board and has been added on Seroquel as needed and ramelteon at nighttime.  Dose of bupropion was decreased.  Continue sitter at bedside.  Mood disorder/ History of depression, anxiety alcohol use disorder.  Overall improved.  Much calmer.  Continue IM vitamin B12.  Psychiatry on board.  Continue [place], Paxil, Seroquel, Wellbutrin.    Chronic leukocytosis Has been elevated since 2012.  Follows up with heme at oncology as outpatient.  Latest WBC at 10.2  Pulmonary nodules: Followed by pulmonology as outpatient.  Coronary artery disease/ H/o STEMIx2-continue Crestor, Effient.  No acute disease.  Dyslipidemia-continue Crestor  Stage 3a chronic kidney disease (CKD)-stable at this time.  Latest creatinine of 1.3.  History of sleep apnea.  Patient had refused wearing CPAP in the past.   DVT prophylaxis: On Lovenox. apixaban (ELIQUIS) tablet 5 mg   Code Status:     Code Status: Full Code  Disposition: Home likely in 2 to 3 days.  Plan for DC cardioversion on 05/10/2022  Status is: Inpatient  Remains inpatient appropriate because: Altered mental status, atrial flutter needing DC cardioversion,   Family Communication:  Spoke with the patient's daughter on the phone and updated her about the clinical condition of the patient on 05/08/2022.  Consultants:  Psychiatry Cardiology  Procedures:  None  Antimicrobials:  None  Anti-infectives (From admission, onward)  None      Subjective: Today, patient was seen and examined at bedside.  Appears to be much more calm and composed.  Had a better night with good sleep.    Objective: Vitals:   05/09/22 0910 05/09/22 0915 05/09/22 0920 05/09/22 1003  BP: (!) 196/96 (!) 194/82 (!)  156/95 (!) 155/76  Pulse:      Resp:      Temp:      TempSrc:      SpO2:      Weight:      Height:        Intake/Output Summary (Last 24 hours) at 05/09/2022 1134 Last data filed at 05/09/2022 0900 Gross per 24 hour  Intake 595.38 ml  Output 600 ml  Net -4.62 ml    Filed Weights   05/06/22 1822 05/08/22 1354  Weight: 102.1 kg 93.6 kg    Physical Examination:  Body mass index is 29.61 kg/m.   General: Obese built, not in obvious distress, alert awake and Communicative, HENT:   No scleral pallor or icterus noted. Oral mucosa is moist.  Chest:  Clear breath sounds.  Diminished breath sounds bilaterally. No crackles or wheezes.  CVS: S1 &S2 heard. No murmur.  Tachycardia noted. Abdomen: Soft, nontender, nondistended.  Bowel sounds are heard.   Extremities: No cyanosis, clubbing or edema.  Peripheral pulses are palpable. Psych: Alert, awake and oriented to place and person, CNS:  No cranial nerve deficits.  Power equal in all extremities.   Skin: Warm and dry.  No rashes noted.  Data Reviewed:   CBC: Recent Labs  Lab 05/06/22 1850 05/06/22 2207 05/07/22 0200 05/08/22 0516 05/09/22 0147  WBC 17.5* 19.5* 18.7* 11.2* 10.2  NEUTROABS 11.8*  --  11.2*  --   --   HGB 15.8 15.2 13.6 12.6* 12.9*  HCT 45.6 44.0 40.2 36.8* 37.7*  MCV 83.4 85.1 85.2 85.2 84.9  PLT 256 257 238 213 217     Basic Metabolic Panel: Recent Labs  Lab 05/06/22 1850 05/06/22 2207 05/07/22 0200 05/07/22 0940 05/08/22 0516 05/09/22 0147  NA 138  --  137 138 140 139  K 2.6*  --  2.6* 3.2* 3.3* 3.6  CL 101  --  104 104 108 104  CO2 25  --  23 21* 21* 26  GLUCOSE 118*  --  93 136* 108* 125*  BUN 25*  --  '23 21 16 12  '$ CREATININE 1.98* 1.91* 1.72* 1.71* 1.34* 1.32*  CALCIUM 8.8*  --  8.0* 8.9 8.6* 8.9  MG 2.1  --  2.0  --  1.8 1.8     Liver Function Tests: No results for input(s): "AST", "ALT", "ALKPHOS", "BILITOT", "PROT", "ALBUMIN" in the last 168 hours.   Radiology Studies: No  results found.    LOS: 3 days    Flora Lipps, MD [facility] Available via Epic secure chat 7am-7pm After these hours, please refer to coverage provider listed on [email or web address] 05/09/2022, 11:34 AM

## 2022-05-09 NOTE — Progress Notes (Signed)
Rounding Note    Patient Name: Brendan Butler Date of Encounter: 05/09/2022  [place] Cardiologist: [name], MD   Subjective   No acute overnight events. He is still in atrial flutter with rates mostly in the 70s to 110s but got as high as the 150s earlier this morning. He denies any complaints at this time. No chest pain, shortness of breath, or palpitations. He is somewhat lethargic this morning and still has some confusion. He initially had trouble telling me his name but was eventually able to do so. He knew where he was and the year and president but could not tell me what brought him to the hospital. He has also a full body tremor this morning. Safety sitter is in the room.  Inpatient Medications    Scheduled Meds:  apixaban  5 mg Oral BID   buPROPion  300 mg Oral Daily   cyanocobalamin  1,000 mcg Intramuscular Daily   PARoxetine  30 mg Oral Daily   potassium chloride  40 mEq Oral Daily   prasugrel  10 mg Oral Daily   ramelteon  8 mg Oral QHS   rosuvastatin  10 mg Oral Daily   Continuous Infusions:  diltiazem (CARDIZEM) infusion 5 mg/hr (05/08/22 2016)   PRN Meds: acetaminophen **OR** acetaminophen, ondansetron **OR** ondansetron (ZOFRAN) IV, QUEtiapine, senna-docusate   Vital Signs    Vitals:   05/08/22 2001 05/09/22 0055 05/09/22 0140 05/09/22 0449  BP: (!) 147/82 (!) 147/86 (!) 148/90 (!) 154/80  Pulse: 75 74 73 83  Resp: '16  16 16  '$ Temp: 98.3 F (36.8 C)  98.3 F (36.8 C) 98.4 F (36.9 C)  TempSrc: Oral Oral Oral Oral  SpO2: 98% 97% 97% 96%  Weight:      Height:        Intake/Output Summary (Last 24 hours) at 05/09/2022 0642 Last data filed at 05/09/2022 0526 Gross per 24 hour  Intake 772.71 ml  Output 600 ml  Net 172.71 ml      05/08/2022    1:54 PM 05/06/2022    6:22 PM 04/06/2022   10:34 AM  Last 3 Weights  Weight (lbs) 206 lb 5.6 oz 225 lb 222 lb 12.8 oz  Weight (kg) 93.6 kg 102.059 kg 101.061 kg      Telemetry     Atrial flutter with rates mostly in the  70s to 110s but as high as the 150s earlier this morning. - Personally Reviewed  ECG    No new ECG tracing today. - Personally Reviewed  Physical Exam   GEN: No acute distress.   Neck: No JVD. Cardiac: Irregularly irregular rhythm. No murmurs, rubs, or gallops.  Respiratory: No increased work of breathing. Clear to auscultation anteriorly with no wheezes, rhonchi, or rales. GI: Soft, non-distended, and non-tender. MS: No lower extremity edema. No deformity. Skin: Warm and dry. Neuro:  No acute focal deficits. Resting tremor. Psych: Mildly lethargic and confused.  Labs    High Sensitivity Troponin:   Recent Labs  Lab 05/06/22 1850 05/06/22 2110  TROPONINIHS 28* 28*     Chemistry Recent Labs  Lab 05/07/22 0200 05/07/22 0940 05/08/22 0516 05/09/22 0147  NA 137 138 140 139  K 2.6* 3.2* 3.3* 3.6  CL 104 104 108 104  CO2 23 21* 21* 26  GLUCOSE 93 136* 108* 125*  BUN '23 21 16 12  '$ CREATININE 1.72* 1.71* 1.34* 1.32*  CALCIUM 8.0* 8.9 8.6* 8.9  MG 2.0  --  1.8  1.8  GFRNONAA 42* 43* 57* 58*  ANIONGAP '10 13 11 9    '$ Lipids No results for input(s): "CHOL", "TRIG", "HDL", "LABVLDL", "LDLCALC", "CHOLHDL" in the last 168 hours.  Hematology Recent Labs  Lab 05/07/22 0200 05/08/22 0516 05/09/22 0147  WBC 18.7* 11.2* 10.2  RBC 4.72 4.32 4.44  HGB 13.6 12.6* 12.9*  HCT 40.2 36.8* 37.7*  MCV 85.2 85.2 84.9  MCH 28.8 29.2 29.1  MCHC 33.8 34.2 34.2  RDW 12.8 12.9 13.0  PLT 238 213 217   Thyroid  Recent Labs  Lab 05/06/22 1850  TSH 1.856  FREET4 1.19*    BNP Recent Labs  Lab 05/06/22 1850  BNP 36.5    DDimer No results for input(s): "DDIMER" in the last 168 hours.   Radiology    MR BRAIN WO CONTRAST  Result Date: 05/07/2022 CLINICAL DATA:  Slurred speech EXAM: MRI HEAD WITHOUT CONTRAST TECHNIQUE: Multiplanar, multiecho pulse sequences of the brain and surrounding structures were obtained without intravenous contrast.  COMPARISON:  Head CT yesterday FINDINGS: Brain: Diffusion imaging is normal. Chronic small-vessel ischemic changes are seen throughout the pons. Few old small vessel cerebellar infarctions. Cerebral hemispheres show moderate chronic small-vessel ischemic changes of the white matter. No large vessel territory stroke. No mass lesion, hemorrhage, hydrocephalus or extra-axial collection. Vascular: Major vessels at the base of the brain show flow. Skull and upper cervical spine: Negative Sinuses/Orbits: Clear/normal Other: None IMPRESSION: No acute finding. Moderate chronic small-vessel ischemic changes of the pons and cerebral hemispheric white matter. Electronically Signed   By: Nelson Chimes M.D.   On: 05/07/2022 13:39    Cardiac Studies   Echocardiogram 12/26/2021: Impressions:  1. Left ventricular ejection fraction, by estimation, is 55 to 60%. The  left ventricle has normal function. The left ventricle demonstrates  regional wall motion abnormalities (suggestive of prior anteroseptal/LAD  infarct). The left ventricular internal  cavity size was mildly dilated. Left ventricular diastolic parameters are  consistent with Grade I diastolic dysfunction (impaired relaxation).   2. Right ventricular systolic function is normal. The right ventricular  size is normal. Tricuspid regurgitation signal is inadequate for assessing  PA pressure.   3. The mitral valve is grossly normal. No evidence of mitral valve  regurgitation. No evidence of mitral stenosis.   4. The aortic valve is tricuspid. Aortic valve regurgitation is not  visualized. No aortic stenosis is present.   Comparison(s): LVEF has improve from prior reporting.    Patient Profile     72 y.o. male with a history of CAD s/p multiple PCI's to LAD (most recently in 2018), chronic HFmrEF with EF as low as 35-40% in 12/2012 but normalized to 55-60% on last Echo in 11/2021, hypertension, hyperlipidemia, CKD stage III, obstructive sleep apnea,  pulmonary nodules, and anxiety/depression who was admitted on 05/06/2018 for altered mental status.  He was also noted to be in new onset rapid atrial flutter on admission for which Cardiology was consulted.  Assessment & Plan    New Onset Atrial Flutter with RVR Patient presented with altered mental status as stated above and was incidentally found to be in rapid atrial flutter with rates initially in the 140s.  He was significantly hypokalemic on arrival with potassium of 2.6 which was repleted.  Magnesium and TSH were within normal limits.  Echo in 11/2021 showed normal EF.  He was started on IV Diltiazem with improvement in rates. - Still in atrial flutter this morning. Rates mostly in the 70s to 110s but  as high as the 150s earlier this morning.  - Continue IV of Diltiazem. - Will start Lopressor '25mg'$  twice daily. Per chart review, as previously had some bradycardia on beta-blockers so will need to watch this closely especially after we get him back in sinus rhythm.  - Continue anticoagulation with Eliquis 5 mg twice daily. - Plan is for TEE/DCCV tomorrow. Patient presented with altered mental status and initially was very agitated. He is still somewhat altered but improved and no recent agitation. Briefly discussed TEE/ DCCV with patient but I am not sure that he can consent on his own for this procedure. Will try to contact family for consent.   CAD He has a history of single-vessel CAD involving the LAD s/p multiple PCIs dating back to 2001.  Last PCI was in angioplasty for in-stent restenosis of LAD in 2015.  High-sensitivity troponin minimally elevated and flat at 28 >> 28 consistent with demand ischemia in the setting of rapid atrial flutter. - No chest pain.  - Currently Effient 10 mg daily. He has been on this since 2014 after failing Plavix and Brilinta.  Not on aspirin due to allergy.  Will continue Effient given multiple reasons for stent restenosis of LAD in the past; however, will  need to watch closely for any bleeding given addition of Eliquis. - Continue high intensity statin.  Chronic CHmrEF Ischemic Cardiomyopathy EF as low as 35-40% in 12/2012 in the setting of an anterior MI.  However, EF later normalized.  Last echo in 11/2021 showed LVEF of 55-60% with hypokinesis of the entire anterior septum (suggestive of prior anteroseptal/LAD infarct) grade 1 diastolic dysfunction. - Euvolemic on exam. - On Lasix '20mg'$  daily at home. This is currently on hold but can likely restart at discharge. - Will start Lopressor '25mg'$  twice daily.  - On Lisinopril '40mg'$  daily at home but this is currently on hold. Continue to hold to allow for more rate control if needed.  Hypertension BP mildly elevated with ssystolic BP mostly in the 1402-150s/ 80s over the last 24 hours.  - Continue IV Diltiazem.  - Will start Lopressor '25mg'$  twice daily.  Hyperlipidemia Last LDL from 2015 was 61. Goal <55 given multiple MIs. - Continue home Crestor '10mg'$  daily. - Will check fasting lipid panel in the morning. If LDL above goal, would recommend increasing Crestor.  CKD Stage III Creatinine 1.91 on admission. Last known creatinine was in the 1.6 to 1.8 range in fall of 2023. - Creatinine has been improving the last couple of days and is 1.32 today.  Hypokalemia Potassium initially 2.6. Repleted.  - Stable at 3.6 today.  - Continue KCl 40 mEq daily for now. - Continue to monitor closely.  Otherwise, per primary team: - Altered mental status - Chronic leukocytosis - Pulmonary nodules   For questions or updates, please contact [place] Please consult [email or web address] for contact info under        Signed, Darreld Mclean, PA-C  05/09/2022, 6:42 AM

## 2022-05-09 NOTE — Discharge Instructions (Signed)

## 2022-05-09 NOTE — H&P (View-Only) (Signed)
   Plan is for TEE/DCCV tomorrow for atrial flutter. Patient presented with altered mental status and still has some confusion. Called and spoke with daughter Amber Varner and reviewed risks and benefits of procedure. The risks [stroke, cardiac arrhythmias rarely resulting in the need for a temporary or permanent pacemaker, skin irritation or burns, esophageal damage, perforation (1:10,000 risk), bleeding, pharyngeal hematoma as well as other potential complications associated with conscious sedation including aspiration, arrhythmia, respiratory failure and death], benefits (treatment guidance, restoration of normal sinus rhythm, diagnostic support) and alternatives of a transesophageal echocardiogram guided cardioversion were discussed in detail with daughter and she agrees to proceed. Dayna Dunn PA-C was present and confirmed telephone consent. We both signed consent. This will be placed in paper chart.  Rosibel Giacobbe E Rana Hochstein, PA-C 05/09/2022 10:38 AM       

## 2022-05-09 NOTE — Care Management Important Message (Signed)
Important Message  Patient Details  Name: Brendan Butler MRN: [id number] Date of Birth: November 30, 1950   Medicare Important Message Given:  Yes     [name] 05/09/2022, 8:51 AM

## 2022-05-09 NOTE — TOC Benefit Eligibility Note (Signed)
Patient Teacher, English as a foreign language completed.    The patient is currently admitted and upon discharge could be taking Eliquis 5 mg.  The current 30 day co-pay is $0.00.   The patient is insured through [employer] [place], [place] Patient Advocate Specialist [place] Patient Advocate Team Direct Number: [phone]  Fax: [phone]

## 2022-05-10 ENCOUNTER — Inpatient Hospital Stay (HOSPITAL_COMMUNITY)
Admit: 2022-05-10 | Discharge: 2022-05-10 | Disposition: A | Discharge status: Home / Self Care | Payer: Medicare HMO | Attending: Student | Admitting: Student

## 2022-05-10 ENCOUNTER — Inpatient Hospital Stay (HOSPITAL_COMMUNITY): Payer: Medicare HMO | Admitting: Certified Registered Nurse Anesthetist

## 2022-05-10 ENCOUNTER — Encounter (HOSPITAL_COMMUNITY): Payer: Self-pay | Admitting: Family Medicine

## 2022-05-10 ENCOUNTER — Other Ambulatory Visit: Payer: Self-pay

## 2022-05-10 ENCOUNTER — Telehealth: Payer: Self-pay | Admitting: Emergency Medicine

## 2022-05-10 ENCOUNTER — Encounter (HOSPITAL_COMMUNITY)
Admission: EM | Disposition: A | Discharge status: Home / Self Care | Payer: Self-pay | Source: Home / Self Care | Attending: Internal Medicine

## 2022-05-10 DIAGNOSIS — I252 Old myocardial infarction: Secondary | ICD-10-CM

## 2022-05-10 DIAGNOSIS — I4892 Unspecified atrial flutter: Secondary | ICD-10-CM

## 2022-05-10 DIAGNOSIS — F05 Delirium due to known physiological condition: Secondary | ICD-10-CM

## 2022-05-10 DIAGNOSIS — I2510 Atherosclerotic heart disease of native coronary artery without angina pectoris: Secondary | ICD-10-CM

## 2022-05-10 DIAGNOSIS — I10 Essential (primary) hypertension: Secondary | ICD-10-CM

## 2022-05-10 DIAGNOSIS — I340 Nonrheumatic mitral (valve) insufficiency: Secondary | ICD-10-CM

## 2022-05-10 DIAGNOSIS — F330 Major depressive disorder, recurrent, mild: Secondary | ICD-10-CM | POA: Insufficient documentation

## 2022-05-10 DIAGNOSIS — Z87891 Personal history of nicotine dependence: Secondary | ICD-10-CM

## 2022-05-10 HISTORY — PX: CARDIOVERSION: SHX1299

## 2022-05-10 HISTORY — PX: TEE WITHOUT CARDIOVERSION: SHX5443

## 2022-05-10 LAB — CBC
HCT: 41.8 % (ref 39.0–52.0)
Hemoglobin: 14.3 g/dL (ref 13.0–17.0)
MCH: 29.1 pg (ref 26.0–34.0)
MCHC: 34.2 g/dL (ref 30.0–36.0)
MCV: 85.1 fL (ref 80.0–100.0)
Platelets: 266 10*3/uL (ref 150–400)
RBC: 4.91 MIL/uL (ref 4.22–5.81)
RDW: 13 % (ref 11.5–15.5)
WBC: 11.1 10*3/uL — ABNORMAL HIGH (ref 4.0–10.5)
nRBC: 0 % (ref 0.0–0.2)

## 2022-05-10 LAB — MAGNESIUM: Magnesium: 1.9 mg/dL (ref 1.7–2.4)

## 2022-05-10 LAB — BASIC METABOLIC PANEL
Anion gap: 9 (ref 5–15)
BUN: 15 mg/dL (ref 8–23)
CO2: 24 mmol/L (ref 22–32)
Calcium: 9 mg/dL (ref 8.9–10.3)
Chloride: 107 mmol/L (ref 98–111)
Creatinine, Ser: 1.41 mg/dL — ABNORMAL HIGH (ref 0.61–1.24)
GFR, Estimated: 54 mL/min — ABNORMAL LOW (ref >60)
Glucose, Bld: 116 mg/dL — ABNORMAL HIGH (ref 70–99)
Potassium: 4.3 mmol/L (ref 3.5–5.1)
Sodium: 140 mmol/L (ref 135–145)

## 2022-05-10 LAB — GLUCOSE, CAPILLARY: Glucose-Capillary: 101 mg/dL — ABNORMAL HIGH (ref 70–99)

## 2022-05-10 LAB — ECHO TEE

## 2022-05-10 SURGERY — ECHOCARDIOGRAM, TRANSESOPHAGEAL
Anesthesia: Monitor Anesthesia Care

## 2022-05-10 MED ORDER — LORAZEPAM 2 MG/ML IJ SOLN: 1.0000 mg | Freq: Once | INTRAMUSCULAR | Status: DC

## 2022-05-10 MED ORDER — SACUBITRIL-VALSARTAN 24-26 MG PO TABS: 1.0000 | ORAL_TABLET | Freq: Two times a day (BID) | ORAL | Status: DC

## 2022-05-10 MED ORDER — PANTOPRAZOLE SODIUM 40 MG PO TBEC: 40.0000 mg | DELAYED_RELEASE_TABLET | Freq: Every day | ORAL | 0 refills | Status: DC

## 2022-05-10 MED ORDER — RAMELTEON 8 MG PO TABS: 8.0000 mg | ORAL_TABLET | Freq: Every day | ORAL | 0 refills | Status: DC

## 2022-05-10 MED ORDER — HYDRALAZINE HCL 20 MG/ML IJ SOLN
5.0000 mg | Freq: Four times a day (QID) | INTRAMUSCULAR | Status: DC | PRN
  Administered 2022-05-10: 5 mg via INTRAVENOUS
  Filled 2022-05-10: qty 1

## 2022-05-10 MED ORDER — VITAMIN B-12 1000 MCG PO TABS: 1000.0000 ug | ORAL_TABLET | Freq: Every day | ORAL | 0 refills | Status: DC

## 2022-05-10 MED ORDER — METOPROLOL TARTRATE 25 MG PO TABS: 25.0000 mg | ORAL_TABLET | Freq: Two times a day (BID) | ORAL | 0 refills | Status: DC

## 2022-05-10 MED ORDER — SODIUM CHLORIDE 0.9 % IV SOLN: INTRAVENOUS | Status: DC | PRN

## 2022-05-10 MED ORDER — BUPROPION HCL ER (XL) 150 MG PO TB24: 300.0000 mg | ORAL_TABLET | Freq: Every day | ORAL | 2 refills | Status: DC

## 2022-05-10 MED ORDER — RAMELTEON 8 MG PO TABS
8.0000 mg | ORAL_TABLET | Freq: Every day | ORAL | Status: DC
  Filled 2022-05-10: qty 1

## 2022-05-10 MED ORDER — APIXABAN 5 MG PO TABS: 5.0000 mg | ORAL_TABLET | Freq: Two times a day (BID) | ORAL | 0 refills | Status: DC

## 2022-05-10 MED ORDER — CLOPIDOGREL BISULFATE 75 MG PO TABS
300.0000 mg | ORAL_TABLET | Freq: Once | ORAL | Status: AC
  Administered 2022-05-10: 300 mg via ORAL
  Filled 2022-05-10: qty 4

## 2022-05-10 MED ORDER — QUETIAPINE FUMARATE 25 MG PO TABS: 25.0000 mg | ORAL_TABLET | Freq: Every day | ORAL | 0 refills | Status: DC

## 2022-05-10 MED ORDER — PROPOFOL 500 MG/50ML IV EMUL
INTRAVENOUS | Status: DC | PRN
  Administered 2022-05-10: 150 ug/kg/min via INTRAVENOUS

## 2022-05-10 MED ORDER — QUETIAPINE FUMARATE 25 MG PO TABS: 25.0000 mg | ORAL_TABLET | Freq: Every day | ORAL | Status: DC

## 2022-05-10 MED ORDER — CLOPIDOGREL BISULFATE 75 MG PO TABS: 75.0000 mg | ORAL_TABLET | Freq: Every day | ORAL | Status: DC

## 2022-05-10 MED ORDER — CLOPIDOGREL BISULFATE 75 MG PO TABS: 75.0000 mg | ORAL_TABLET | Freq: Every day | ORAL | 0 refills | Status: DC

## 2022-05-10 MED ORDER — METOPROLOL TARTRATE 25 MG PO TABS
25.0000 mg | ORAL_TABLET | Freq: Two times a day (BID) | ORAL | Status: DC
  Administered 2022-05-10: 25 mg via ORAL
  Filled 2022-05-10: qty 1

## 2022-05-10 MED ORDER — SACUBITRIL-VALSARTAN 24-26 MG PO TABS: 1.0000 | ORAL_TABLET | Freq: Two times a day (BID) | ORAL | 0 refills | Status: DC

## 2022-05-10 MED ORDER — LIDOCAINE HCL (CARDIAC) PF 100 MG/5ML IV SOSY
PREFILLED_SYRINGE | INTRAVENOUS | Status: DC | PRN
  Administered 2022-05-10: 100 mg via INTRAVENOUS

## 2022-05-10 NOTE — Addendum Note (Signed)
Addendum  created 05/10/22 1456 by Audry Pili, MD   Attestation recorded in [place], [place] filed

## 2022-05-10 NOTE — Progress Notes (Signed)
   Discussed TEE findings with Dr. Audie Box. The LVEF is now down in the 20% range with global hypokinesis. This may have been related to atrial flutter, although he does have a CAD history. He does not appear to be in decompensated heart failure.  He was successfully cardioverted. He is currently on metoprolol tartrate 25 mg BID. Will stop diltiazem. Other than related to his procedure today, BP has been elevated. Creatinine has been 1.3-1.4. Will add Entresto 24/26 mg BID for HF benefit starting tonight - he has not been on ARB or ACE-I. Consider adding SGLT2 and/or aldactone at some point as well.  Pixie Casino, MD, [facility], [place] Director of the Advanced Lipid Disorders &  Cardiovascular Risk Reduction Clinic Diplomate of the American Board of Clinical Lipidology Attending Cardiologist  Direct Dial: [phone]  Fax: [phone]  Website:  [email or web address]

## 2022-05-10 NOTE — Consult Note (Addendum)
[facility] New Face-to-Face Psychiatric Evaluation   Service Date: May 10, 2022 LOS:  LOS: 4 days    Assessment  Brendan Butler is a 72 y.o. male admitted medically for 05/06/2022  6:18 PM for altered mental status. He carries the psychiatric diagnoses of depression, anxiety, and alcohol use disorder (in remission) and has a past medical history of OSA not on CPAP, hypertension, hyperlipidemia, CAD, CHF. Psychiatry was consulted for delusional thoughts and AMS by Sherrill Raring, PA-C.   Delirium seems improved compared to when seen last 2 days ago. Will schedule seroquel 25 mg qhs to aid with sleep. Patient's risk for delirium is high given he does not use CPAP and risk for harm is mild-moderate given he lives alone, has episodes of confusion and there are firearms in the house. I have warned both patient and patient's daughter that firearms in the house is risky especially with someone who has episodes of confusion and they verbalized understanding of the recommendation to have firearms removed/locked away.   Based on patient's initial presentation of AMS at night in setting of four days of poor sleep with a past hx of insomnia, patient's presentation is most likely 2/2 delirium. Patient's delirium is likely 2/2 insomnia, underlying infection (WBC is 18.7 this AM), elyte abnormalities (K of 2.6 this AM). While unlikely, an NMDA antibody disorder in setting of pulmonary nodule is a possibility.   Diagnoses:  Active Hospital problems: Principal Problem:   Atrial flutter with rapid ventricular response (HCC) Active Problems:   Dyslipidemia   Essential hypertension   Coronary artery disease involving native coronary artery of native heart without angina pectoris   OSA on CPAP   Stage 3a chronic kidney disease (CKD) (HCC)   Bronchiectasis (HCC)   Hypokalemia   Altered mental status   MDD (major depressive disorder), recurrent episode, mild ([place])   Delirium due to another  medical condition     Plan  ## Safety and Observation Level:  - Based on my clinical evaluation, I estimate the patient to be at low risk of self harm in the current setting.  - Delirium precautions - Continue sitter  ## Medications:  -Continue wellbutrin XL '300mg'$  qAM.  -Continue ramelteon '8mg'$  qhs for insomnia -Continue seroquel 25 mg tid prn for agitation -START seroquel 25 mg qhs for anxiety and sleep  -A1c and lipid panel can be done outpatient  -Cardiac monitoring 05/09/22 Qtc 440 -Recommend avoiding cannabis use  ## Further Work-up:  -- most recent EKG on 3/11 had QtC of 464 and was remarkable for atrial flutter with varied AV block -- Pertinent labwork reviewed earlier this admission includes:  -B12 (low at 114 pg/mL), repleted -TSH (wnl) -UA (no infection) -UDS (positive for THC) -CT head w/o contrast (remarkable for mild age-related atrophy, chronic microvascular ischemic changes, and small old bilateral basal ganglia lacunar infarcts, with no acute intracranial hemorrhage, mass effect, or extra-axial fluid collection. -EKG (remarkable for atrial flutter with variable AV block) -MRI w/o contrast (remarkable for chronic small-vessel ischemic changes throughout the pons, few old small vessel cerebellar infarcts, chronic small-vessel ischemic changes of white matter in the cerebral hemispheres, and no large vessel teritory stroke)  ## Disposition:  --none  ## Behavioral / Environmental:  -- Endorses having multiple unsecured firearms at home -- Lives alone, but reports that his daughters live nearby and check on him frequently -- Daughter has no concerns for patient's safety at home  Substance use -Hx of EtOH use disorder, in  remission since around 2001  - 50py hx of smoking cigarettes, stopped in 2001 -Reports smoking marijuana every day, but reports that one joint will last him a week because "the stuff now is so strong". Reports regular usage since 1973  ##Legal  Status -- Patient is being held voluntarily  Thank you for this consult request. Recommendations have been communicated to the primary team.  We will sign off at this time.   France Ravens, MD PGY2 Psychiatry Resident  History of Present Illness  Relevant Aspects of Hospital Course:  Admitted on 05/06/2022 for afib with RVR. Patient was brought in by his daughter to the [facility] ED on 3/10 for afib with RVR with reported "odd behavior" for a week. Per patient's family, patient has reportedly been acting differently for a week after an episode of expressive aphasia. Since this episode of aphasia, his family reports he has been acting differently, manifesting as slurred speech, word-finding difficulty, poor sleep, and delusions that family members are smearing feces across his house.   Patient Report:  Patient seen and assessed at bedside.  Patient alert and oriented x3. Unable to do DOWB or computation but overall improved.  He did state that he slept better last night with ramelteon and seroquel. He stated that he spoke with daughter yesterday. Discussed continuing current psychotropic medication regimen and he was agreeable.  He reports his mood to be somewhat depressed given still being in the hospital.  He reports that he would not feel this feeling of depression if he was not in the hospital.  Reports mild anxiety related to being still in the hospital as well as with upcoming TEE with cardioversion.  He reports having some memory problems but overall feels unchanged from his baseline.  I discussed scheduling Seroquel at night to aid with sleep and he was agreeable to this.  He denies SI/HI/AVH today.  I discussed risk for firearms in the house and recommended having them locked up or removed from home and he verbalized understanding to recommendation but wanted to continue having firearms for his own safety.  ROS:  Patient was A+Ox4, and denies delusions, homicidal ideation, suicidal ideation, auditory  hallucinations, and visual hallucinations. Patient's speech was clear.  Collateral information:  Archivist (daughter) Daughter reports that patient seems to be at baseline based on seeing him 2 days prior as well as speaking with him yesterday.  She reports that he has some increased irritability compared to baseline but she anticipated this given he was like this at his last hospitalization.  I discussed with her my concerns for delirium and given his risk factors related to delirium including not being on CPAP machine despite his OSA, taking Lasix at night, being on high doses of Wellbutrin.  I discussed that weight adjusted as many of these risk factors as possible but he would likely need more support in order to aid him as he advances in age.  I also discussed with her the risk factors related to having firearms in the house and him having high risk for delirium.  She verbalized understanding and stated that she would talk with him as well regarding these recommendations.  She stated that she would be calling insurance company to see if there was nurse or someone that would be able to check in on him and family would also check in on him more frequently given that he is still living by himself at this time. All questions addressed at this time. Some irritability.  Psychiatric History:  Information collected from patient. He denied having any history of psychiatric illness or medications, but when directly asked, he confirmed that he is on wellbutrin and paxil, which he was prescribed for "nerves", which are effective. He denies seeing a therapist in the past, and reports only saw a psychiatrist a single time when he was applying for disability.    Social History:  -Lives alone in a house, but his daughters frequently check up on him -He has multiple firearms at home, at least one of which is an unsecured shotgun -Employment: on disability  Tobacco use: stopped smoking "cold [place]" in 2001,  but uses one can/week of chewing tobacco Alcohol use: stopped drinking "cold [place]" in 2001 Drug use: smokes one joint/week for joint pain and insomnia since 1973. Denies drug use otherwise  Family History:   The patient's family history includes Alzheimer's disease in his mother; CAD in his father.  Medical History: Past Medical History:  Diagnosis Date   Acute myocardial infarction of other anterior wall, initial episode of care 12/29/2012   Promus stent to LAD   Acute ST segment elevation MI ([place]) 01/05/2013   secondary to thrombus in stent; Brilintta changed to Effient pt with ASA allergy   Coronary artery disease 2001   stent to LAD and patent 2007 on cath   Echocardiogram abnormal 01/06/2013   EF [id number], grade I diastolic dysfunction   Erectile dysfunction    Hyperlipidemia LDL goal <70    Hypertension    OSA (obstructive sleep apnea)    No CPAP use overnight    Surgical History: Past Surgical History:  Procedure Laterality Date   BACK SURGERY  2007   Decompression lumbar laminectomy and micro discectomy, L4-5   BRONCHIAL BIOPSY  12/28/2021   Procedure: BRONCHIAL BIOPSIES;  Surgeon: Collene Gobble, MD;  Location: [facility];  Service: Pulmonary;;   BRONCHIAL BRUSHINGS  12/28/2021   Procedure: BRONCHIAL BRUSHINGS;  Surgeon: Collene Gobble, MD;  Location: [facility];  Service: Pulmonary;;   BRONCHIAL NEEDLE ASPIRATION BIOPSY  12/28/2021   Procedure: BRONCHIAL NEEDLE ASPIRATION BIOPSIES;  Surgeon: Collene Gobble, MD;  Location: [facility];  Service: Pulmonary;;   BRONCHIAL WASHINGS  12/28/2021   Procedure: BRONCHIAL WASHINGS;  Surgeon: Collene Gobble, MD;  Location: [facility];  Service: Pulmonary;;   CARDIAC CATHETERIZATION  2007   patent stent to LAD and normal [place]   CHOLECYSTECTOMY N/A 12/26/2021   Procedure: LAPAROSCOPIC CHOLECYSTECTOMY, LAPAROSCOPIC LYSIS OF ADHESIONS GREATER THAN 1 HOUR;  Surgeon: [name], MD;  Location: [facility];  Service: General;   Laterality: N/A;   CORONARY ANGIOPLASTY WITH STENT PLACEMENT  2001   to LAD   CORONARY ANGIOPLASTY WITH STENT PLACEMENT  12/29/12   STEMI with promus DES to LAD   CORONARY ANGIOPLASTY WITH STENT PLACEMENT  01/05/13   STEMI with thrombosis in stent to LAD   FIDUCIAL MARKER PLACEMENT  12/28/2021   Procedure: FIDUCIAL MARKER PLACEMENT;  Surgeon: Collene Gobble, MD;  [facility];  Service: Pulmonary;;   LEFT HEART CATH N/A 12/29/2012   Procedure: LEFT HEART CATH;  Surgeon: Jettie Booze, MD;  Location: [facility] CATH LAB;  Service: Cardiovascular;  Laterality: N/A;   LEFT HEART CATHETERIZATION WITH CORONARY ANGIOGRAM N/A 01/05/2013   Procedure: LEFT HEART CATHETERIZATION WITH CORONARY ANGIOGRAM;  Surgeon: Blane Ohara, MD;  Location: [facility] C[facility] LAB;  Service: Cardiovascular;  Laterality: N/A;   LEFT HEART CATHETERIZATION WITH CORONARY ANGIOGRAM N/A 04/22/2013   Procedure: LEFT  HEART CATHETERIZATION WITH CORONARY ANGIOGRAM;  Surgeon: Troy Sine, MD;  Location: [facility] CATH LAB;  Service: Cardiovascular;  Laterality: N/A;   PARTIAL NEPHRECTOMY Left 1975   PATIENT ONLY HAS ONE KIDNEY   PERCUTANEOUS CORONARY STENT INTERVENTION (PCI-S)  12/29/2012   Procedure: PERCUTANEOUS CORONARY STENT INTERVENTION (PCI-S);  Surgeon: Jettie Booze, MD;  Location: [facility] CATH LAB;  Service: Cardiovascular;;    Medications:   Current Facility-Administered Medications:    0.9 %  sodium chloride infusion, , Intravenous, Continuous, Darreld Mclean, PA-C, Held at 05/09/22 1448   acetaminophen (TYLENOL) tablet 650 mg, 650 mg, Oral, Q6H PRN, 650 mg at 05/09/22 2111 **OR** acetaminophen (TYLENOL) suppository 650 mg, 650 mg, Rectal, Q6H PRN, Claria Dice, Debby, MD   apixaban (ELIQUIS) tablet 5 mg, 5 mg, Oral, BID, Hilty, Nadean Corwin, MD, 5 mg at 05/10/22 0957   buPROPion (WELLBUTRIN XL) 24 hr tablet 300 mg, 300 mg, Oral, Daily, France Ravens, MD, 300 mg at 05/09/22 [id number]   [COMPLETED] clopidogrel (PLAVIX) tablet 300 mg,  300 mg, Oral, Once, 300 mg at 05/10/22 0958 **FOLLOWED BY** [START ON 05/11/2022] clopidogrel (PLAVIX) tablet 75 mg, 75 mg, Oral, Daily, Hilty, Nadean Corwin, MD   cyanocobalamin (VITAMIN B12) injection 1,000 mcg, 1,000 mcg, Intramuscular, Daily, Pokhrel, Laxman, MD, 1,000 mcg at 05/09/22 0933   [COMPLETED] diltiazem (CARDIZEM) 1 mg/mL load via infusion 10 mg, 10 mg, Intravenous, Once, 10 mg at 05/06/22 1957 **AND** diltiazem (CARDIZEM) 125 mg in dextrose 5% 125 mL (1 mg/mL) infusion, 5-15 mg/hr, Intravenous, Continuous, Sage, Haley, PA-C, Last Rate: 5 mL/hr at 05/10/22 0219, 5 mg/hr at 05/10/22 0219   hydrALAZINE (APRESOLINE) injection 5 mg, 5 mg, Intravenous, Q6H PRN, Irene Pap N, DO, 5 mg at 05/10/22 0706   metoprolol tartrate (LOPRESSOR) tablet 25 mg, 25 mg, Oral, BID, Hilty, Nadean Corwin, MD, 25 mg at 05/10/22 0957   ondansetron (ZOFRAN) tablet 4 mg, 4 mg, Oral, Q6H PRN **OR** ondansetron (ZOFRAN) injection 4 mg, 4 mg, Intravenous, Q6H PRN, Crosley, Debby, MD   pantoprazole (PROTONIX) EC tablet 40 mg, 40 mg, Oral, Daily, Sarajane Jews, Callie E, PA-C, 40 mg at 05/09/22 1200   PARoxetine (PAXIL) tablet 30 mg, 30 mg, Oral, Daily, Crosley, Debby, MD, 30 mg at 05/09/22 0931   QUEtiapine (SEROQUEL) tablet 25 mg, 25 mg, Oral, TID PRN, France Ravens, MD, 25 mg at 05/09/22 2111   ramelteon (ROZEREM) tablet 8 mg, 8 mg, Oral, QHS, France Ravens, MD, 8 mg at 05/09/22 2111   rosuvastatin (CRESTOR) tablet 10 mg, 10 mg, Oral, Daily, Crosley, Debby, MD, 10 mg at 05/09/22 0929   senna-docusate (Senokot-S) tablet 1 tablet, 1 tablet, Oral, QHS PRN, Quintella Baton, MD  Allergies: Allergies  Allergen Reactions   Levitra [Vardenafil] Other (See Comments)    Blurred vision   Aspirin Hives   Clopidogrel Other (See Comments)    Not effective anti platelet med for patient-switched to effient   Lipitor [Atorvastatin] Other (See Comments)    Muscle cramps; patient states he does not take medication at home       Objective   Vital signs:  Temp:  [97.7 F (36.5 C)-98.7 F (37.1 C)] 97.8 F (36.6 C) (03/14 0813) Pulse Rate:  [54-98] 74 (03/14 0847) Resp:  [16-20] 20 (03/14 0813) BP: (143-167)/(63-93) 157/90 (03/14 0813) SpO2:  [94 %-100 %] 99 % (03/14 0847)  Psychiatric Specialty Exam:  Presentation  General Appearance: Appropriate for Environment; [place] Contact:Good  Speech:Clear and Coherent; Normal Rate  Speech Volume:Normal  Handedness:Right  Mood and Affect  Mood: "depressed" Affect: full range  Thought Process  Thought Processes:Coherent; Linear; Goal Directed  Descriptions of Associations: Intact  Orientation:Full (Time, Place and Person)  Thought Content:Logical   Hallucinations: denies  Ideas of Reference:None  Suicidal Thoughts:denies  Homicidal Thoughts:denies   Sensorium  Memory:Immediate Good; Recent Good; Remote Good  Judgment:Fair  Insight:Poor   Executive Functions  Concentration:Fair  Attention Span:Fair  [place]   Psychomotor Activity  Psychomotor Activity: decreased   Assets  Assets:Social Support   Sleep  Sleep: good    Physical Exam: Physical Exam Constitutional:      General: He is not in acute distress.    Appearance: Normal appearance. He is not ill-appearing.  HENT:     Head: Normocephalic and atraumatic.  Pulmonary:     Effort: Pulmonary effort is normal.  Musculoskeletal:     Cervical back: Normal range of motion.  Neurological:     Mental Status: He is alert and oriented to person, place, and time.  Psychiatric:        Mood and Affect: Mood normal.        Behavior: Behavior normal.        Thought Content: Thought content normal.    Review of Systems  Psychiatric/Behavioral:  Negative for depression, hallucinations and suicidal ideas. The patient has insomnia.    Blood pressure (!) 157/90, pulse 74, temperature 97.8 F (36.6 C), temperature source Oral,  resp. rate 20, height '5\' 10"'$  (1.778 m), weight 93.6 kg, SpO2 99 %. Body mass index is 29.61 kg/m.

## 2022-05-10 NOTE — CV Procedure (Signed)
   TRANSESOPHAGEAL ECHOCARDIOGRAM GUIDED DIRECT CURRENT CARDIOVERSION  NAME:  Brendan Butler    MRN: [id number] DOB:  1950/04/28    ADMIT DATE: 05/06/2022  INDICATIONS: Symptomatic atrial flutter  PROCEDURE:   Informed consent was obtained prior to the procedure. The risks, benefits and alternatives for the procedure were discussed and the patient comprehended these risks.  Risks include, but are not limited to, cough, sore throat, vomiting, nausea, somnolence, esophageal and stomach trauma or perforation, bleeding, low blood pressure, aspiration, pneumonia, infection, trauma to the teeth and death.    After a procedural time-out, the oropharynx was anesthetized and the patient was sedated by the anesthesia service. The transesophageal probe was inserted in the esophagus and stomach without difficulty and multiple views were obtained. Anesthesia was monitored by Dr. Fransisco Beau.   COMPLICATIONS:    Complications: No complications Patient tolerated procedure well.  KEY FINDINGS:  No LAA thrombus.  LVEF 20-25%.  Full Report to follow.   CARDIOVERSION:     Indications:  Symptomatic Atrial Flutter  Procedure Details:  Once the TEE was complete, the patient had the defibrillator pads placed in the anterior and posterior position. Once an appropriate level of sedation was confirmed, the patient was cardioverted x 1 with 120J of biphasic synchronized energy.  The patient converted to NSR.  There were no apparent complications.  The patient had normal neuro status and respiratory status post procedure with vitals stable as recorded elsewhere.  Adequate airway was maintained throughout and vital signs monitored per protocol.  Brendan Butler, [place]  [address], [place], [place] [address] [phone]  1:03 PM
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
## 2022-05-10 NOTE — Interval H&P Note (Signed)
History and Physical Interval Note:  05/10/2022 12:35 PM  Brendan Butler  has presented today for surgery, with the diagnosis of Atrial flutter.  The various methods of treatment have been discussed with the patient and family. After consideration of risks, benefits and other options for treatment, the patient has consented to  Procedure(s): TRANSESOPHAGEAL ECHOCARDIOGRAM (TEE) (N/A) CARDIOVERSION (N/A) as a surgical intervention.  The patient's history has been reviewed, patient examined, no change in status, stable for surgery.  I have reviewed the patient's chart and labs.  Questions were answered to the patient's satisfaction.    NPO for TEE/DCCV. On eliquis.   [place] T. Audie Box, MD, [place]  [address], [place], [place] [address] [phone]  12:36 PM

## 2022-05-10 NOTE — Progress Notes (Signed)
Called to patient's room d/t agitation and requesting to be sent home. Upon entering patient's room, patient was fully dressed and insisting on leaving the room. Unable to administer seroquel d/t patient's paranoia, states he did not know where medication came from or "what was in the water." Dr. Louanne Belton notified, explained patient was in hallway, patient would not enter room again, stated he was leaving now. Nursing staff called security to assist based on previous psych notes stating patient may require IVC if he tried to leave AMA. Dr. Louanne Belton stated Dr. Sloan Leiter would be up to see patient soon as he was working in another hospital. Dr. Sloan Leiter to bedside to assess patient, states patient is okay to be discharged, will have friend come pick patient up to transport home. Dr Sloan Leiter notified by charge nurse and nurse manager that daughter was requesting ativan given and a new psych consult d/t patient's behavior. Dr Sloan Leiter states there is no need for psych at this time, feels patient is safe to go home as long as he goes home with someone. Dr Sloan Leiter stated Dr Louanne Belton would be in touch with patient's friend to transport patient home for discharge.

## 2022-05-10 NOTE — Discharge Summary (Signed)
Physician Discharge Summary  Brendan Butler [id number] DOB: [birth year] DOA: 05/06/2022  PCP: Wenda Low, MD  Admit date: 05/06/2022 Discharge date: 05/10/2022  Admitted From: Home  Discharge disposition: Home  Recommendations for Outpatient Follow-Up:   Follow up with your primary care provider in one week.  Check CBC, BMP, magnesium in the next visit Follow-up with cardiology in 2 weeks for post cardioversion follow-up Follow-up with pulmonary clinic as scheduled by the clinic for pulmonary nodule follow-up.  Discharge Diagnosis:   Principal Problem:   Atrial flutter with rapid ventricular response (HCC) Active Problems:   Bronchiectasis (HCC)   Stage 3a chronic kidney disease (CKD) (HCC)   Dyslipidemia   Essential hypertension   Coronary artery disease involving native coronary artery of native heart without angina pectoris   OSA on CPAP   Hypokalemia   Altered mental status   MDD (major depressive disorder), recurrent episode, mild ([place])   Delirium due to another medical condition   Discharge Condition: Improved.  Diet recommendation: Low sodium, heart healthy.   Wound care: None.  Code status: Full.   History of Present Illness:   Patient is a 72 year old male with past medical history of hypertension, hyperlipidemia, coronary artery disease, CHF, obstructive sleep apnea not on CPAP presented to the hospital brought in by her family for acting differently for a week with intermittent episodes of slurred speech and word finding difficulty.  At nighttime, patient was having some delusional thoughts and beliefs.  In the ED, patient was not delusional.  He was noted to have atrial flutter with heart rate at 140s and Cardizem drip was initiated.  After admission, patient did have episodes of delirium and psychiatry was consulted.  Cardiology was consulted for atrial flutter with rapid ventricular response and out of Cardizem drip.  Psychiatry and cardiology  followed the patient during hospitalization.    Hospital Course:   Following conditions were addressed during hospitalization as listed below,  Typical atrial flutter with rapid ventricular response, 4:1 AV block Patient was initially on Cardizem drip.  Cardiology was consulted and patient subsequently underwent TEE and cardioversion on 05/10/2022.  TSH within normal range.  Previous 2D echocardiogram on 12/26/2021 with LV ejection fraction of 55 to 60% with regional wall motion abnormality from previous anteroseptal LAD infarct.  TEE now showing reduced LV function.  Patient has been started on Entresto as well.  At this time patient has been started on Eliquis and Plavix and Effient has been discontinued.  Patient will need to follow-up with cardiology as outpatient after discharge.    Hypokalemia Improved after replacement.  Latest potassium prior to discharge was of 4.3.   Acute alteration in mental status likely delirium Improved.  Thought to be multifactorial mostly delirium..  CT head was normal.  Did have mild leukocytosis on presentation.  Chest x-ray without any infiltrate.  Urinalysis was negative for infection.  Ammonia level within normal range.  Vitamin B12 level at 114 and low.  In the context of altered mental status and low vitamin B12, patient received vitamin B12 1000 mcg IM and will be continued on oral on discharge.   Urine drug screen showed THC.Folate within normal limits.  RPR was nonreactive.  HIV nonreactive.  MRI of the brain was negative for acute findings but small vessel chronic changes.Marland Kitchen  Psychiatry initiated the patient on Seroquel as needed and ramelteon at nighttime.  Dose of bupropion was decreased.  Psychiatry has seen the patient today and the impression is delirium.  Patient has been considered stable for disposition.   Mood disorder/ History of depression, anxiety alcohol use disorder.  Overall improved.   Psychiatry was consulted during hospitalization and  thought was possible delirium..  Continue Remelton, Paxil, Seroquel, Wellbutrin (reduced dose).    Chronic leukocytosis Has been elevated since 2012.  Follows up with heme at oncology as outpatient.  Latest WBC at 11.1.    Coronary artery disease/ H/o STEMIx2-continue Crestor, Plavix on discharge.  Was on Effient in the past which has been discontinued.  Cardiology has started the patient on Entresto as well.  Will need outpatient titration of medications.  TEE showed LV ejection fraction of 20% with global hypokinesis.  Patient however is currently compensated.   Dyslipidemia-continue Crestor   Stage 3a chronic kidney disease (CKD)-stable at this time.  Latest creatinine of 1.4.   History of sleep apnea.  Patient had refused wearing CPAP in the past.   Right lower lung nodule with abnormal PET CT scan.  Patient did have PET scan done on 04/11/22 which showed metabolic activity associated with the subpleural nodule in the right lower lobe.  Communicated with Dr. Lamonte Sakai pulmonary and will plan to follow-up as outpatient.  Disposition.  At this time, patient is stable for disposition home with outpatient PCP and cardiology follow-up.  Pulmonary clinic to follow-up with the patient as well.  Communicated with the patient's daughter prior to disposition regarding follow-up, need for picking up medications.  Medical Consultants:   Cardiology  Procedures:    DC cardioversion 05/10/2022 Subjective:   Today, patient was seen and examined at bedside prior to cardioversion.Appears to be more calm and composed and alert awake and Communicative. Could not sleep much though. Denies any chest pain or shortness of breath.  After procedure, patient was adamant about leaving and going home at any cost.  Discharge Exam:   Vitals:   05/10/22 1315 05/10/22 1330  BP: 94/66 116/60  Pulse: (!) 56 60  Resp: 18 16  Temp:  97.7 F (36.5 C)  SpO2: 99% 99%   Vitals:   05/10/22 1221 05/10/22 1310 05/10/22  1315 05/10/22 1330  BP: (!) 160/94 (!) 86/62 94/66 116/60  Pulse: 61 (!) 56 (!) 56 60  Resp: '12 11 18 16  '$ Temp: 98 F (36.7 C) 97.7 F (36.5 C)  97.7 F (36.5 C)  TempSrc: Temporal     SpO2: 99% 99% 99% 99%  Weight:      Height:        General: Alert awake, not in obvious distress, obese built HENT: pupils equally reacting to light,  No scleral pallor or icterus noted. Oral mucosa is moist.  Chest:  Clear breath sounds.  Diminished breath sounds bilaterally. No crackles or wheezes.  CVS: S1 &S2 heard. No murmur. Abdomen: Soft, nontender, nondistended.  Bowel sounds are heard.   Extremities: No cyanosis, clubbing or edema.  Peripheral pulses are palpable. Psych: Alert, awake and oriented, easily gets irritated, CNS:  No cranial nerve deficits.  Power equal in all extremities.   Skin: Warm and dry.  No rashes noted.  The results of significant diagnostics from this hospitalization (including imaging, microbiology, ancillary and laboratory) are listed below for reference.     Diagnostic Studies:   ECHO TEE  Result Date: 05/10/2022    TRANSESOPHOGEAL ECHO REPORT   Patient Name:   Brendan Butler Date of Exam: 05/10/2022 Medical Rec #:  [id number]        Height:  70.0 in Accession #:    [id number]       Weight:       206.3 lb Date of Birth:  1950-08-19       BSA:          2.115 m Patient Age:    4 years         BP:           155/94 mmHg Patient Gender: M                HR:           68 bpm. Exam Location:  Inpatient Procedure: Transesophageal Echo, 3D Echo, Color Doppler and Cardiac Doppler Indications:     Atrial flutter  History:         Patient has prior history of Echocardiogram examinations, most                  recent 12/26/2021. CAD; Risk Factors:Former Smoker,                  Hypertension, Dyslipidemia and Sleep Apnea.  Sonographer:     Johny Chess RDCS Referring Phys:  [id number] Darreld Mclean Diagnosing Phys: Eleonore Chiquito MD PROCEDURE: After discussion of the risks  and benefits of a TEE, an informed consent was obtained from the patient. TEE procedure time was 10 minutes. The transesophogeal probe was passed without difficulty through the esophogus of the patient. Imaged were obtained with the patient in a left lateral decubitus position. Sedation performed by different physician. The patient was monitored while under deep sedation. Anesthestetic sedation was provided intravenously by Anesthesiology: '285mg'$  of Propofol, '100mg'$  of Lidocaine. Image quality was excellent. The patient's vital signs; including heart rate, blood pressure, and oxygen saturation; remained stable throughout the procedure. The patient developed no complications during the procedure. A successful direct current cardioversion was performed at 120 joules with 1 attempt.  IMPRESSIONS  1. Successful TEE/DCCV with return to NSR. No LAA thrombus. LVEF 20-25%. Global HK.  2. Left ventricular ejection fraction, by estimation, is 20 to 25%. The left ventricle has severely decreased function.  3. Right ventricular systolic function is mildly reduced. The right ventricular size is normal.  4. Left atrial size was moderately dilated. No left atrial/left atrial appendage thrombus was detected. The LAA emptying velocity was 39 cm/s.  5. Right atrial size was mildly dilated.  6. The mitral valve is grossly normal. Mild mitral valve regurgitation. No evidence of mitral stenosis.  7. The aortic valve is tricuspid. Aortic valve regurgitation is not visualized. No aortic stenosis is present.  8. There is Moderate (Grade III) layered plaque involving the descending aorta. FINDINGS  Left Ventricle: Left ventricular ejection fraction, by estimation, is 20 to 25%. The left ventricle has severely decreased function. The left ventricular internal cavity size was normal in size. Right Ventricle: The right ventricular size is normal. No increase in right ventricular wall thickness. Right ventricular systolic function is mildly  reduced. Left Atrium: Left atrial size was moderately dilated. No left atrial/left atrial appendage thrombus was detected. The LAA emptying velocity was 39 cm/s. Right Atrium: Right atrial size was mildly dilated. Pericardium: There is no evidence of pericardial effusion. Mitral Valve: The mitral valve is grossly normal. Mild mitral valve regurgitation. No evidence of mitral valve stenosis. Tricuspid Valve: The tricuspid valve is grossly normal. Tricuspid valve regurgitation is trivial. No evidence of tricuspid stenosis. Aortic Valve: The aortic valve is tricuspid. Aortic valve regurgitation is not  visualized. No aortic stenosis is present. Pulmonic Valve: The pulmonic valve was grossly normal. Pulmonic valve regurgitation is trivial. No evidence of pulmonic stenosis. Aorta: The aortic root and ascending aorta are structurally normal, with no evidence of dilitation. There is moderate (Grade III) layered plaque involving the descending aorta. Venous: The left upper pulmonary vein, left lower pulmonary vein, right lower pulmonary vein and right upper pulmonary vein are normal. IAS/Shunts: No atrial level shunt detected by color flow Doppler.  LEFT VENTRICLE PLAX 2D LVOT diam:     2.00 cm LV SV:         35 LV SV Index:   17        3D Volume EF LVOT Area:     3.14 cm  LV 3D EDV:   95.68 ml                          LV 3D ESV:   74.18 ml AORTIC VALVE LVOT Vmax:   55.50 cm/s LVOT Vmean:  39.100 cm/s LVOT VTI:    0.113 m  AORTA Ao Root diam: 3.85 cm Ao Asc diam:  3.63 cm  SHUNTS Systemic VTI:  0.11 m Systemic Diam: 2.00 cm Eleonore Chiquito MD Electronically signed by Eleonore Chiquito MD Signature Date/Time: 05/10/2022/1:37:52 PM    Final      Labs:   Basic Metabolic Panel: Recent Labs  Lab 05/06/22 1850 05/06/22 2207 05/07/22 0200 05/07/22 0940 05/08/22 0516 05/09/22 0147 05/10/22 0142  NA 138  --  137 138 140 139 140  K 2.6*  --  2.6* 3.2* 3.3* 3.6 4.3  CL 101  --  104 104 108 104 107  CO2 25  --  23 21* 21*  26 24  GLUCOSE 118*  --  93 136* 108* 125* 116*  BUN 25*  --  '23 21 16 12 15  '$ CREATININE 1.98*   < > 1.72* 1.71* 1.34* 1.32* 1.41*  CALCIUM 8.8*  --  8.0* 8.9 8.6* 8.9 9.0  MG 2.1  --  2.0  --  1.8 1.8 1.9   < > = values in this interval not displayed.   GFR Estimated Creatinine Clearance: 56.8 mL/min (A) (by C-G formula based on SCr of 1.41 mg/dL (H)). Liver Function Tests: No results for input(s): "AST", "ALT", "ALKPHOS", "BILITOT", "PROT", "ALBUMIN" in the last 168 hours. No results for input(s): "LIPASE", "AMYLASE" in the last 168 hours. Recent Labs  Lab 05/07/22 0235  AMMONIA 34   Coagulation profile Recent Labs  Lab 05/09/22 1505  INR 1.1    CBC: Recent Labs  Lab 05/06/22 1850 05/06/22 2207 05/07/22 0200 05/08/22 0516 05/09/22 0147 05/10/22 0142  WBC 17.5* 19.5* 18.7* 11.2* 10.2 11.1*  NEUTROABS 11.8*  --  11.2*  --   --   --   HGB 15.8 15.2 13.6 12.6* 12.9* 14.3  HCT 45.6 44.0 40.2 36.8* 37.7* 41.8  MCV 83.4 85.1 85.2 85.2 84.9 85.1  PLT 256 257 238 213 217 266   Cardiac Enzymes: No results for input(s): "CKTOTAL", "CKMB", "CKMBINDEX", "TROPONINI" in the last 168 hours. BNP: Invalid input(s): "POCBNP" CBG: Recent Labs  Lab 05/10/22 1303  GLUCAP 101*   D-Dimer No results for input(s): "DDIMER" in the last 72 hours. Hgb A1c No results for input(s): "HGBA1C" in the last 72 hours. Lipid Profile No results for input(s): "CHOL", "HDL", "LDLCALC", "TRIG", "CHOLHDL", "LDLDIRECT" in the last 72 hours. Thyroid function studies No results for input(s): "TSH", "  T4TOTAL", "T3FREE", "THYROIDAB" in the last 72 hours.  Invalid input(s): "FREET3" Anemia work up No results for input(s): "VITAMINB12", "FOLATE", "FERRITIN", "TIBC", "IRON", "RETICCTPCT" in the last 72 hours. Microbiology No results found for this or any previous visit (from the past 240 hour(s)).   Discharge Instructions:   Discharge Instructions     Diet - low sodium heart healthy   Complete  by: As directed    Discharge instructions   Complete by: As directed    Follow up with your primary care provider in one week. Follow up with cardiology in 2 weeks. Follow up with pulmonary clinic for follow of your PET scan. Take medications as prescribed. Take precautions while on blood thinner.   Increase activity slowly   Complete by: As directed       Allergies as of 05/10/2022       Reactions   Levitra [vardenafil] Other (See Comments)   Blurred vision   Aspirin Hives   Clopidogrel Other (See Comments)   Not effective anti platelet med for patient-switched to effient   Lipitor [atorvastatin] Other (See Comments)   Muscle cramps; patient states he does not take medication at home        Medication List     STOP taking these medications    lisinopril 40 MG tablet Commonly known as: ZESTRIL   prasugrel 10 MG Tabs tablet Commonly known as: Effient       TAKE these medications    apixaban 5 MG Tabs tablet Commonly known as: ELIQUIS Take 1 tablet (5 mg total) by mouth 2 (two) times daily.   buPROPion 150 MG 24 hr tablet Commonly known as: WELLBUTRIN XL Take 2 tablets (300 mg total) by mouth daily. What changed: how much to take   clopidogrel 75 MG tablet Commonly known as: PLAVIX Take 1 tablet (75 mg total) by mouth daily. Start taking on: May 11, 2022   cyanocobalamin 1000 MCG tablet Commonly known as: VITAMIN B12 Take 1 tablet (1,000 mcg total) by mouth daily.   fluticasone 50 MCG/ACT nasal spray Commonly known as: FLONASE Place 1 spray into both nostrils daily.   furosemide 20 MG tablet Commonly known as: LASIX Take 1 tablet (20 mg total) by mouth daily.   metoprolol tartrate 25 MG tablet Commonly known as: LOPRESSOR Take 1 tablet (25 mg total) by mouth 2 (two) times daily.   nitroGLYCERIN 0.4 MG SL tablet Commonly known as: NITROSTAT Place 1 tablet (0.4 mg total) under the tongue every 5 (five) minutes x 3 doses as needed for chest pain.  NEED OV.   pantoprazole 40 MG tablet Commonly known as: PROTONIX Take 1 tablet (40 mg total) by mouth daily. Start taking on: May 11, 2022   PARoxetine 30 MG tablet Commonly known as: PAXIL Take 30 mg by mouth daily.   potassium chloride 10 MEQ tablet Commonly known as: KLOR-CON M Take 10 mEq by mouth daily.   pregabalin 150 MG capsule Commonly known as: LYRICA Take 450 mg by mouth daily.   QUEtiapine 25 MG tablet Commonly known as: SEROQUEL Take 1 tablet (25 mg total) by mouth at bedtime.   ramelteon 8 MG tablet Commonly known as: ROZEREM Take 1 tablet (8 mg total) by mouth at bedtime.   rosuvastatin 10 MG tablet Commonly known as: CRESTOR Take 10 mg by mouth daily.   sacubitril-valsartan 24-26 MG Commonly known as: ENTRESTO Take 1 tablet by mouth 2 (two) times daily.  Time coordinating discharge: 39 minutes  Signed:  Emonte Dieujuste  [facility] 05/10/2022, 5:01 PM

## 2022-05-10 NOTE — Anesthesia Postprocedure Evaluation (Signed)
Anesthesia Post Note  Patient: Brendan Butler  Procedure(s) Performed: TRANSESOPHAGEAL ECHOCARDIOGRAM (TEE) CARDIOVERSION     Patient location during evaluation: PACU Anesthesia Type: General Level of consciousness: awake and alert Pain management: pain level controlled Vital Signs Assessment: post-procedure vital signs reviewed and stable Respiratory status: spontaneous breathing, nonlabored ventilation and respiratory function stable Cardiovascular status: stable and blood pressure returned to baseline Anesthetic complications: no   No notable events documented.  Last Vitals:  Vitals:   05/10/22 1315 05/10/22 1330  BP: 94/66 116/60  Pulse: (!) 56 60  Resp: 18 16  Temp:  36.5 C  SpO2: 99% 99%    Last Pain:  Vitals:   05/10/22 1330  TempSrc:   PainSc: 0-No pain                 Audry Pili

## 2022-05-10 NOTE — Anesthesia Procedure Notes (Signed)
Procedure Name: MAC Date/Time: 05/10/2022 12:54 PM  Performed by: Lieutenant Diego, CRNAPre-anesthesia Checklist: Emergency Drugs available, Patient identified, Suction available, Patient being monitored and Timeout performed Patient Re-evaluated:Patient Re-evaluated prior to induction Oxygen Delivery Method: Nasal cannula Preoxygenation: Pre-oxygenation with 100% oxygen Induction Type: IV induction

## 2022-05-10 NOTE — Anesthesia Preprocedure Evaluation (Addendum)
Anesthesia Evaluation  Patient identified by MRN, date of birth, ID band Patient awake    Reviewed: Allergy & Precautions, NPO status , Patient's Chart, lab work & pertinent test results  History of Anesthesia Complications Negative for: history of anesthetic complications  Airway Mallampati: II  TM Distance: >3 FB Neck ROM: Full    Dental  (+) Poor Dentition   Pulmonary sleep apnea , former smoker   Pulmonary exam normal        Cardiovascular hypertension, Pt. on medications + CAD, + Past MI (2014) and + Cardiac Stents  Normal cardiovascular exam     Neuro/Psych    Depression    negative neurological ROS     GI/Hepatic negative GI ROS, Neg liver ROS,,,  Endo/Other  negative endocrine ROS    Renal/GU Renal InsufficiencyRenal disease  negative genitourinary   Musculoskeletal  (+) Arthritis ,    Abdominal   Peds  Hematology negative hematology ROS (+)   Anesthesia Other Findings Day of surgery medications reviewed with patient.  Reproductive/Obstetrics negative OB ROS                             Anesthesia Physical Anesthesia Plan  ASA: 3  Anesthesia Plan: MAC   Post-op Pain Management: Minimal or no pain anticipated   Induction:   PONV Risk Score and Plan: Treatment may vary due to age or medical condition and Propofol infusion  Airway Management Planned: Natural Airway and Nasal Cannula  Additional Equipment: None  Intra-op Plan:   Post-operative Plan:   Informed Consent: I have reviewed the patients History and Physical, chart, labs and discussed the procedure including the risks, benefits and alternatives for the proposed anesthesia with the patient or authorized representative who has indicated his/her understanding and acceptance.       Plan Discussed with: CRNA  Anesthesia Plan Comments:        Anesthesia Quick Evaluation

## 2022-05-10 NOTE — Care Management (Signed)
  Transition of Care [facility]) Screening Note   Patient Details  Name: Brendan Butler Date of Birth: 09/04/1950   Transition of Care [facility]) CM/SW Contact:    Bethena Roys, RN Phone Number: 05/10/2022, 2:19 PM    Transition of Care Department [facility]) has reviewed the patient and no TOC needs have been identified at this time. Patient presented for the evaluation of atrial flutter. Plan for cardioversion today. We will continue to monitor patient advancement through interdisciplinary progression rounds. If new patient transition needs arise, please place a TOC consult.

## 2022-05-10 NOTE — Transfer of Care (Signed)
Immediate Anesthesia Transfer of Care Note  Patient: Brendan Butler  Procedure(s) Performed: TRANSESOPHAGEAL ECHOCARDIOGRAM (TEE) CARDIOVERSION  Patient Location: PACU  Anesthesia Type:MAC  Level of Consciousness: drowsy  Airway & Oxygen Therapy: Patient Spontanous Breathing and Patient connected to nasal cannula oxygen  Post-op Assessment: Report given to RN and Post -op Vital signs reviewed and stable  Post vital signs: Reviewed and stable  Last Vitals:  Vitals Value Taken Time  BP 86/62 05/10/22 1309  Temp    Pulse 56 05/10/22 1313  Resp 17 05/10/22 1313  SpO2 98 % 05/10/22 1313  Vitals shown include unvalidated device data.  Last Pain:  Vitals:   05/10/22 1221  TempSrc: Temporal  PainSc: 0-No pain      Patients Stated Pain Goal: 0 ([id number])  Complications: No notable events documented.

## 2022-05-10 NOTE — Progress Notes (Addendum)
PROGRESS NOTE    JOANNE NOSBISCH  [id number] DOB: Jul 13, 1950 DOA: 05/06/2022 PCP: Wenda Low, MD    Brief Narrative:  Patient is a 72 year old male with past medical history of HTN, HLD, CAD, CHF, OSA not on CPAP presented to the hospital brought in by her family for acting differently for a week with intermittent episodes of slurred speech and word finding difficulty.  At nighttime, patient was having some delusional thoughts and beliefs.  In the ED, patient was not delusional.  He was noted to have atrial flutter with heart rate at 140s and Cardizem drip was initiated.  After admission, patient did have episodes of delirium and psychiatry was consulted.  Cardiology was consulted for atrial flutter with rapid ventricular response and out of Cardizem drip.  Psychiatry and cardiology following at this time.  Patient appears to be better, still on Cardizem drip.  Plan for DC cardioversion 05/10/2022.  Assessment and plan.  Typical atrial flutter with rapid ventricular response, 4:1 AV block  On Cardizem drip.  Cardiology on board. TSH within normal range.  2D echocardiogram with LV ejection fraction of 55 to 60% with regional wall motion abnormality from previous anteroseptal LAD infarct.  Patient is on antiplatelet therapy with Effient due to history of failure of Plavix and allergy to aspirin.  Now has been started on Eliquis for anticoagulation.  Plan for TEE/DC cardioversion on Thursday, 05/10/2022, after anticoagulation.  Hypokalemia Improved after replacement.  Latest potassium of 4.3.  Acute alteration in mental status likely delirium Improved.  Sudden etiology but multifactorial.  CT head was normal.  Did have mild leukocytosis on presentation.  Chest x-ray without any infiltrate.  Urinalysis was negative for infection.  Ammonia level within normal range.  Vitamin B12 level at 114 and low.  In the context of altered mental status and low vitamin B12 will consider IM vitamin B12 1000  mcg daily for 7 days.  Will consider continuation of vitamin B12 on discharge.  Urine drug screen showed THC.Folate within normal limits.  RPR was nonreactive.  HIV nonreactive.  MRI of the brain was negative for acute findings but small vessel chronic changes.Marland Kitchen  Psychiatry on board and has been added on Seroquel as needed and ramelteon at nighttime.  Dose of bupropion was decreased.  Psychiatry has seen the patient today and the impression is delirium.  Mood disorder/ History of depression, anxiety alcohol use disorder.  Overall improved.   Psychiatry was consulted.  Ontinue [place], Paxil, Seroquel, Wellbutrin.    Chronic leukocytosis Has been elevated since 2012.  Follows up with heme at oncology as outpatient.  Latest WBC at 11.1.  Pulmonary nodules: Followed by pulmonology as outpatient.  Coronary artery disease/ H/o STEMIx2-continue Crestor, Effient.  No acute disease.  Dyslipidemia-continue Crestor  Stage 3a chronic kidney disease (CKD)-stable at this time.  Latest creatinine of 1.4.  History of sleep apnea.  Patient had refused wearing CPAP in the past.  Right lower lung nodule with abnormal PET CT scan.  Patient did have PET scan done on 04/11/22 which showed metabolic activity associated with the subpleural nodule in the right lower lobe.  Communicated with Dr. Lamonte Sakai pulmonary.  He recommends continuation of the cardiac treatment for now and outpatient follow-up for his nodule.  Okay with anticoagulation for now.   DVT prophylaxis: On Lovenox. apixaban (ELIQUIS) tablet 5 mg   Code Status:     Code Status: Full Code  Disposition: Home likely in 1-2 days.  Plan for DC cardioversion on  05/10/2022  Status is: Inpatient  Remains inpatient appropriate because: Altered mental status, atrial flutter needing DC cardioversion,   Family Communication:  Spoke with the patient's daughter on the phone and updated her about the clinical condition of the patient on  05/08/2022.  Consultants:  Psychiatry Cardiology  Procedures:  None  Antimicrobials:  None  Anti-infectives (From admission, onward)    None      Subjective: Today, patient was seen and examined at bedside.  Appears to be more calm and composed and alert awake and Communicative.  Could not sleep much though.  Denies any chest pain or shortness of breath.  Objective: Vitals:   05/10/22 0847 05/10/22 1204 05/10/22 1221 05/10/22 1310  BP:  (!) 143/97 (!) 160/94   Pulse: 74  61   Resp:   12   Temp:   98 F (36.7 C) 97.7 F (36.5 C)  TempSrc:   Temporal   SpO2: 99%  99%   Weight:      Height:        Intake/Output Summary (Last 24 hours) at 05/10/2022 1329 Last data filed at 05/10/2022 0700 Gross per 24 hour  Intake 282.83 ml  Output 500 ml  Net -217.17 ml    Filed Weights   05/06/22 1822 05/08/22 1354  Weight: 102.1 kg 93.6 kg    Physical Examination:  Body mass index is 29.61 kg/m.   General: Obese built, not in obvious distress, alert awake and Communicative, HENT:   No scleral pallor or icterus noted. Oral mucosa is moist.  Chest:  Clear breath sounds.  Diminished breath sounds bilaterally. No crackles or wheezes.  CVS: S1 &S2 heard.  Tachycardia noted. Abdomen: Soft, nontender, nondistended.  Bowel sounds are heard.   Extremities: No cyanosis, clubbing or edema.  Peripheral pulses are palpable. Psych: Alert, awake and oriented to place and person, CNS:  No cranial nerve deficits.  Power equal in all extremities.   Skin: Warm and dry.  No rashes noted.  Data Reviewed:   CBC: Recent Labs  Lab 05/06/22 1850 05/06/22 2207 05/07/22 0200 05/08/22 0516 05/09/22 0147 05/10/22 0142  WBC 17.5* 19.5* 18.7* 11.2* 10.2 11.1*  NEUTROABS 11.8*  --  11.2*  --   --   --   HGB 15.8 15.2 13.6 12.6* 12.9* 14.3  HCT 45.6 44.0 40.2 36.8* 37.7* 41.8  MCV 83.4 85.1 85.2 85.2 84.9 85.1  PLT 256 257 238 213 217 266     Basic Metabolic Panel: Recent Labs  Lab  05/06/22 1850 05/06/22 2207 05/07/22 0200 05/07/22 0940 05/08/22 0516 05/09/22 0147 05/10/22 0142  NA 138  --  137 138 140 139 140  K 2.6*  --  2.6* 3.2* 3.3* 3.6 4.3  CL 101  --  104 104 108 104 107  CO2 25  --  23 21* 21* 26 24  GLUCOSE 118*  --  93 136* 108* 125* 116*  BUN 25*  --  '23 21 16 12 15  '$ CREATININE 1.98*   < > 1.72* 1.71* 1.34* 1.32* 1.41*  CALCIUM 8.8*  --  8.0* 8.9 8.6* 8.9 9.0  MG 2.1  --  2.0  --  1.8 1.8 1.9   < > = values in this interval not displayed.     Liver Function Tests: No results for input(s): "AST", "ALT", "ALKPHOS", "BILITOT", "PROT", "ALBUMIN" in the last 168 hours.   Radiology Studies: No results found.    LOS: 4 days    Flora Lipps, MD [facility] Available via Epic secure chat 7am-7pm After these hours, please refer to coverage provider listed on [email or web address] 05/10/2022, 1:29 PM

## 2022-05-10 NOTE — Telephone Encounter (Signed)
Need to make this patient an office visit for Dr. Lamonte Sakai to review his PET scan.  Thank you

## 2022-05-10 NOTE — Progress Notes (Signed)
  Echocardiogram Echocardiogram Transesophageal has been performed.  Brendan Butler 05/10/2022, 1:18 PM

## 2022-05-11 ENCOUNTER — Telehealth: Payer: Self-pay | Admitting: Physician Assistant

## 2022-05-11 NOTE — Telephone Encounter (Signed)
Left message for patient to call back  

## 2022-05-11 NOTE — Telephone Encounter (Signed)
   Received word this morning that patient had left AMA overnight. IM did send in meds to pt's usual pharmacy though there is concern for patient noncompliance. Will route this msg to triage to please contact patient to reinforce importance of picking up his medicines. Please also arrange close follow-up within 1 week. Also recommend he see primary care within 1 week given the medical issues noted during his admission as well. Thank you. Charlie Pitter, PA-C

## 2022-05-11 NOTE — Telephone Encounter (Signed)
ATC X1 unable to lvm will try again later

## 2022-05-12 ENCOUNTER — Encounter (HOSPITAL_COMMUNITY): Payer: Self-pay | Admitting: Cardiovascular Disease

## 2022-05-15 NOTE — Telephone Encounter (Signed)
Left message for patient to call back  

## 2022-05-15 NOTE — Telephone Encounter (Signed)
ATC X1 LVM for patient to call the office back for a appointment to go over PET scan results. Will attempt to call again later

## 2022-05-16 NOTE — Telephone Encounter (Signed)
ATC patient again to set up appointment with Dr. Lamonte Sakai. Sending a letter today and closing this encounter

## 2022-05-20 ENCOUNTER — Other Ambulatory Visit: Payer: Self-pay

## 2022-05-20 ENCOUNTER — Emergency Department (HOSPITAL_COMMUNITY): Payer: Medicare HMO

## 2022-05-20 ENCOUNTER — Encounter (HOSPITAL_COMMUNITY): Payer: Self-pay

## 2022-05-20 ENCOUNTER — Inpatient Hospital Stay (HOSPITAL_COMMUNITY)
Admission: EM | Admit: 2022-05-20 | Discharge: 2022-05-29 | DRG: 308 | Disposition: A | Discharge status: Home Health | Payer: Medicare HMO | Attending: Family Medicine | Admitting: Family Medicine

## 2022-05-20 DIAGNOSIS — I4892 Unspecified atrial flutter: Secondary | ICD-10-CM | POA: Diagnosis not present

## 2022-05-20 DIAGNOSIS — R627 Adult failure to thrive: Secondary | ICD-10-CM | POA: Diagnosis present

## 2022-05-20 DIAGNOSIS — Z888 Allergy status to other drugs, medicaments and biological substances status: Secondary | ICD-10-CM

## 2022-05-20 DIAGNOSIS — R0602 Shortness of breath: Secondary | ICD-10-CM | POA: Diagnosis not present

## 2022-05-20 DIAGNOSIS — I130 Hypertensive heart and chronic kidney disease with heart failure and stage 1 through stage 4 chronic kidney disease, or unspecified chronic kidney disease: Secondary | ICD-10-CM | POA: Diagnosis present

## 2022-05-20 DIAGNOSIS — K5900 Constipation, unspecified: Secondary | ICD-10-CM | POA: Diagnosis present

## 2022-05-20 DIAGNOSIS — R296 Repeated falls: Secondary | ICD-10-CM | POA: Diagnosis present

## 2022-05-20 DIAGNOSIS — I252 Old myocardial infarction: Secondary | ICD-10-CM | POA: Diagnosis not present

## 2022-05-20 DIAGNOSIS — Z515 Encounter for palliative care: Secondary | ICD-10-CM | POA: Diagnosis not present

## 2022-05-20 DIAGNOSIS — Z7901 Long term (current) use of anticoagulants: Secondary | ICD-10-CM

## 2022-05-20 DIAGNOSIS — I4819 Other persistent atrial fibrillation: Principal | ICD-10-CM | POA: Diagnosis present

## 2022-05-20 DIAGNOSIS — I4891 Unspecified atrial fibrillation: Secondary | ICD-10-CM | POA: Diagnosis present

## 2022-05-20 DIAGNOSIS — F419 Anxiety disorder, unspecified: Secondary | ICD-10-CM | POA: Diagnosis present

## 2022-05-20 DIAGNOSIS — F32A Depression, unspecified: Secondary | ICD-10-CM | POA: Diagnosis present

## 2022-05-20 DIAGNOSIS — W19XXXA Unspecified fall, initial encounter: Secondary | ICD-10-CM | POA: Diagnosis present

## 2022-05-20 DIAGNOSIS — Z955 Presence of coronary angioplasty implant and graft: Secondary | ICD-10-CM | POA: Diagnosis not present

## 2022-05-20 DIAGNOSIS — R000 Tachycardia, unspecified: Secondary | ICD-10-CM | POA: Diagnosis not present

## 2022-05-20 DIAGNOSIS — E8809 Other disorders of plasma-protein metabolism, not elsewhere classified: Secondary | ICD-10-CM | POA: Diagnosis present

## 2022-05-20 DIAGNOSIS — N1831 Chronic kidney disease, stage 3a: Secondary | ICD-10-CM | POA: Diagnosis not present

## 2022-05-20 DIAGNOSIS — E538 Deficiency of other specified B group vitamins: Secondary | ICD-10-CM | POA: Diagnosis present

## 2022-05-20 DIAGNOSIS — F05 Delirium due to known physiological condition: Secondary | ICD-10-CM | POA: Diagnosis present

## 2022-05-20 DIAGNOSIS — E876 Hypokalemia: Secondary | ICD-10-CM | POA: Diagnosis not present

## 2022-05-20 DIAGNOSIS — I495 Sick sinus syndrome: Secondary | ICD-10-CM | POA: Diagnosis present

## 2022-05-20 DIAGNOSIS — R531 Weakness: Secondary | ICD-10-CM | POA: Diagnosis not present

## 2022-05-20 DIAGNOSIS — I483 Typical atrial flutter: Secondary | ICD-10-CM | POA: Diagnosis present

## 2022-05-20 DIAGNOSIS — Z91199 Patient's noncompliance with other medical treatment and regimen due to unspecified reason: Secondary | ICD-10-CM

## 2022-05-20 DIAGNOSIS — I255 Ischemic cardiomyopathy: Secondary | ICD-10-CM | POA: Diagnosis present

## 2022-05-20 DIAGNOSIS — I5022 Chronic systolic (congestive) heart failure: Secondary | ICD-10-CM | POA: Diagnosis present

## 2022-05-20 DIAGNOSIS — E875 Hyperkalemia: Secondary | ICD-10-CM | POA: Diagnosis present

## 2022-05-20 DIAGNOSIS — S301XXA Contusion of abdominal wall, initial encounter: Secondary | ICD-10-CM | POA: Diagnosis present

## 2022-05-20 DIAGNOSIS — I110 Hypertensive heart disease with heart failure: Secondary | ICD-10-CM | POA: Diagnosis not present

## 2022-05-20 DIAGNOSIS — Z8249 Family history of ischemic heart disease and other diseases of the circulatory system: Secondary | ICD-10-CM

## 2022-05-20 DIAGNOSIS — Z79899 Other long term (current) drug therapy: Secondary | ICD-10-CM

## 2022-05-20 DIAGNOSIS — Z87891 Personal history of nicotine dependence: Secondary | ICD-10-CM | POA: Diagnosis not present

## 2022-05-20 DIAGNOSIS — Z886 Allergy status to analgesic agent status: Secondary | ICD-10-CM | POA: Diagnosis not present

## 2022-05-20 DIAGNOSIS — G9341 Metabolic encephalopathy: Secondary | ICD-10-CM | POA: Diagnosis not present

## 2022-05-20 DIAGNOSIS — K5730 Diverticulosis of large intestine without perforation or abscess without bleeding: Secondary | ICD-10-CM | POA: Diagnosis not present

## 2022-05-20 DIAGNOSIS — Z7189 Other specified counseling: Secondary | ICD-10-CM | POA: Diagnosis not present

## 2022-05-20 DIAGNOSIS — R410 Disorientation, unspecified: Secondary | ICD-10-CM | POA: Diagnosis not present

## 2022-05-20 DIAGNOSIS — E785 Hyperlipidemia, unspecified: Secondary | ICD-10-CM | POA: Diagnosis present

## 2022-05-20 DIAGNOSIS — Z043 Encounter for examination and observation following other accident: Secondary | ICD-10-CM | POA: Diagnosis not present

## 2022-05-20 DIAGNOSIS — S0990XA Unspecified injury of head, initial encounter: Secondary | ICD-10-CM | POA: Diagnosis not present

## 2022-05-20 DIAGNOSIS — Z905 Acquired absence of kidney: Secondary | ICD-10-CM

## 2022-05-20 DIAGNOSIS — I340 Nonrheumatic mitral (valve) insufficiency: Secondary | ICD-10-CM | POA: Diagnosis not present

## 2022-05-20 DIAGNOSIS — I700 Atherosclerosis of aorta: Secondary | ICD-10-CM | POA: Diagnosis present

## 2022-05-20 DIAGNOSIS — I2510 Atherosclerotic heart disease of native coronary artery without angina pectoris: Secondary | ICD-10-CM | POA: Diagnosis present

## 2022-05-20 DIAGNOSIS — S2241XA Multiple fractures of ribs, right side, initial encounter for closed fracture: Secondary | ICD-10-CM | POA: Diagnosis present

## 2022-05-20 DIAGNOSIS — N529 Male erectile dysfunction, unspecified: Secondary | ICD-10-CM | POA: Diagnosis present

## 2022-05-20 DIAGNOSIS — D72829 Elevated white blood cell count, unspecified: Secondary | ICD-10-CM | POA: Diagnosis present

## 2022-05-20 DIAGNOSIS — G9340 Encephalopathy, unspecified: Secondary | ICD-10-CM | POA: Diagnosis not present

## 2022-05-20 DIAGNOSIS — I10 Essential (primary) hypertension: Secondary | ICD-10-CM | POA: Diagnosis not present

## 2022-05-20 DIAGNOSIS — R4182 Altered mental status, unspecified: Secondary | ICD-10-CM | POA: Diagnosis not present

## 2022-05-20 DIAGNOSIS — G4733 Obstructive sleep apnea (adult) (pediatric): Secondary | ICD-10-CM | POA: Diagnosis present

## 2022-05-20 DIAGNOSIS — Z7902 Long term (current) use of antithrombotics/antiplatelets: Secondary | ICD-10-CM

## 2022-05-20 DIAGNOSIS — Z820 Family history of epilepsy and other diseases of the nervous system: Secondary | ICD-10-CM

## 2022-05-20 DIAGNOSIS — I509 Heart failure, unspecified: Secondary | ICD-10-CM | POA: Diagnosis not present

## 2022-05-20 LAB — CBC
HCT: 38.9 % — ABNORMAL LOW (ref 39.0–52.0)
Hemoglobin: 12.8 g/dL — ABNORMAL LOW (ref 13.0–17.0)
MCH: 28.5 pg (ref 26.0–34.0)
MCHC: 32.9 g/dL (ref 30.0–36.0)
MCV: 86.6 fL (ref 80.0–100.0)
Platelets: 241 10*3/uL (ref 150–400)
RBC: 4.49 MIL/uL (ref 4.22–5.81)
RDW: 13 % (ref 11.5–15.5)
WBC: 9.1 10*3/uL (ref 4.0–10.5)
nRBC: 0 % (ref 0.0–0.2)

## 2022-05-20 LAB — COMPREHENSIVE METABOLIC PANEL
ALT: 19 U/L (ref 0–44)
AST: 18 U/L (ref 15–41)
Albumin: 2.7 g/dL — ABNORMAL LOW (ref 3.5–5.0)
Alkaline Phosphatase: 84 U/L (ref 38–126)
Anion gap: 10 (ref 5–15)
BUN: 14 mg/dL (ref 8–23)
CO2: 26 mmol/L (ref 22–32)
Calcium: 8.8 mg/dL — ABNORMAL LOW (ref 8.9–10.3)
Chloride: 105 mmol/L (ref 98–111)
Creatinine, Ser: 1.26 mg/dL — ABNORMAL HIGH (ref 0.61–1.24)
GFR, Estimated: >60 mL/min (ref >60)
Glucose, Bld: 125 mg/dL — ABNORMAL HIGH (ref 70–99)
Potassium: 3.7 mmol/L (ref 3.5–5.1)
Sodium: 141 mmol/L (ref 135–145)
Total Bilirubin: 1.4 mg/dL — ABNORMAL HIGH (ref 0.3–1.2)
Total Protein: 6.1 g/dL — ABNORMAL LOW (ref 6.5–8.1)

## 2022-05-20 LAB — URINALYSIS, W/ REFLEX TO CULTURE (INFECTION SUSPECTED)
Bacteria, UA: NONE SEEN
Bilirubin Urine: NEGATIVE
Glucose, UA: NEGATIVE mg/dL
Hgb urine dipstick: NEGATIVE
Ketones, ur: NEGATIVE mg/dL
Leukocytes,Ua: NEGATIVE
Nitrite: NEGATIVE
Protein, ur: NEGATIVE mg/dL
Specific Gravity, Urine: 1.034 — ABNORMAL HIGH (ref 1.005–1.030)
pH: 5 (ref 5.0–8.0)

## 2022-05-20 LAB — TROPONIN I (HIGH SENSITIVITY)
Troponin I (High Sensitivity): 14 ng/L (ref <18)
Troponin I (High Sensitivity): 12 ng/L (ref <18)

## 2022-05-20 MED ORDER — PAROXETINE HCL 30 MG PO TABS
30.0000 mg | ORAL_TABLET | Freq: Every day | ORAL | Status: DC
  Administered 2022-05-21 – 2022-05-29 (×9): 30 mg via ORAL
  Filled 2022-05-20 (×9): qty 1

## 2022-05-20 MED ORDER — APIXABAN 5 MG PO TABS
5.0000 mg | ORAL_TABLET | Freq: Two times a day (BID) | ORAL | Status: DC
  Administered 2022-05-20 – 2022-05-22 (×4): 5 mg via ORAL
  Filled 2022-05-20 (×4): qty 1

## 2022-05-20 MED ORDER — QUETIAPINE FUMARATE 25 MG PO TABS
25.0000 mg | ORAL_TABLET | Freq: Every day | ORAL | Status: DC
  Administered 2022-05-20 – 2022-05-28 (×9): 25 mg via ORAL
  Filled 2022-05-20 (×9): qty 1

## 2022-05-20 MED ORDER — PREGABALIN 25 MG PO CAPS
450.0000 mg | ORAL_CAPSULE | Freq: Every day | ORAL | Status: DC
  Administered 2022-05-21: 450 mg via ORAL
  Filled 2022-05-20: qty 2

## 2022-05-20 MED ORDER — ROSUVASTATIN CALCIUM 5 MG PO TABS
10.0000 mg | ORAL_TABLET | Freq: Every day | ORAL | Status: DC
  Administered 2022-05-21 – 2022-05-23 (×3): 10 mg via ORAL
  Filled 2022-05-20 (×3): qty 2

## 2022-05-20 MED ORDER — ACETAMINOPHEN 325 MG PO TABS
650.0000 mg | ORAL_TABLET | ORAL | Status: DC | PRN
  Administered 2022-05-27 – 2022-05-28 (×2): 650 mg via ORAL
  Filled 2022-05-20 (×2): qty 2

## 2022-05-20 MED ORDER — ONDANSETRON HCL 4 MG/2ML IJ SOLN: 4.0000 mg | Freq: Four times a day (QID) | INTRAMUSCULAR | Status: DC | PRN

## 2022-05-20 MED ORDER — SACUBITRIL-VALSARTAN 24-26 MG PO TABS
1.0000 | ORAL_TABLET | Freq: Two times a day (BID) | ORAL | Status: DC
  Administered 2022-05-20 – 2022-05-23 (×7): 1 via ORAL
  Filled 2022-05-20 (×7): qty 1

## 2022-05-20 MED ORDER — CLOPIDOGREL BISULFATE 75 MG PO TABS
75.0000 mg | ORAL_TABLET | Freq: Every day | ORAL | Status: DC
  Administered 2022-05-21 – 2022-05-24 (×4): 75 mg via ORAL
  Filled 2022-05-20 (×4): qty 1

## 2022-05-20 MED ORDER — DILTIAZEM LOAD VIA INFUSION
10.0000 mg | Freq: Once | INTRAVENOUS | Status: AC
  Administered 2022-05-20: 10 mg via INTRAVENOUS
  Filled 2022-05-20: qty 10

## 2022-05-20 MED ORDER — PANTOPRAZOLE SODIUM 40 MG PO TBEC
40.0000 mg | DELAYED_RELEASE_TABLET | Freq: Every day | ORAL | Status: DC
  Administered 2022-05-21 – 2022-05-29 (×9): 40 mg via ORAL
  Filled 2022-05-20 (×10): qty 1

## 2022-05-20 MED ORDER — IOPAMIDOL (ISOVUE-370) INJECTION 76%
75.0000 mL | Freq: Once | INTRAVENOUS | Status: AC | PRN
  Administered 2022-05-20: 75 mL via INTRAVENOUS

## 2022-05-20 MED ORDER — DILTIAZEM HCL-DEXTROSE 125-5 MG/125ML-% IV SOLN (PREMIX)
5.0000 mg/h | INTRAVENOUS | Status: DC
  Administered 2022-05-20: 5 mg/h via INTRAVENOUS
  Administered 2022-05-21: 12.5 mg/h via INTRAVENOUS
  Filled 2022-05-20 (×2): qty 125

## 2022-05-20 MED ORDER — METOPROLOL TARTRATE 25 MG PO TABS
25.0000 mg | ORAL_TABLET | Freq: Two times a day (BID) | ORAL | Status: DC
  Administered 2022-05-20 – 2022-05-21 (×2): 25 mg via ORAL
  Filled 2022-05-20 (×2): qty 1

## 2022-05-20 MED ORDER — BUPROPION HCL ER (XL) 300 MG PO TB24
300.0000 mg | ORAL_TABLET | Freq: Every day | ORAL | Status: DC
  Administered 2022-05-21 – 2022-05-29 (×9): 300 mg via ORAL
  Filled 2022-05-20 (×10): qty 1

## 2022-05-20 NOTE — ED Notes (Signed)
Provider at bedside

## 2022-05-20 NOTE — H&P (Signed)
History and Physical    Brendan Butler [id number] DOB: 1950/07/02 DOA: 05/20/2022  PCP: Wenda Low, MD   Chief Complaint: weakness and falls  HPI: Brendan Butler is a 72 y.o. male with medical history significant of hypertension, hyperlipidemia, CAD, CHF, obstructive sleep apnea who presented to the hospital with altered mental status and falls.  Patient was recently admitted and discharged on 3/14.  At that time he was found to have similar complaints.  In the ER he was in A-fib with RVR and given dill drip.  He was found to be in a flutter was eventually cardioverted.  Echocardiogram showed reduced EF.  He was started on goal-directed medical therapy as well as Eliquis.  Plavix was discontinued.  Patient was planned to have cardiology follow-up.  Since discharge patient is not taking any of his medications.  Family found him to not be eating and be extremely weak.  Is also had numerous falls.  In the ER he was tachycardic in the 100s.  Labs were obtained which were notable for sodium 141, potassium 3.7, creatinine 1.26, WBC 9.1, hemoglobin 12.8, troponin 14, 12, urinalysis negative for infection.  Chest x-ray was obtained which showed no acute cardiopulmonary disease.  CT head was obtained which showed no acute intracranial abnormalities.  CT chest abdomen pelvis was obtained which showed minimally displaced rib fractures and hematoma of the right flank.  Patient was started on diltiazem drip for aflutter with good rate control.  He was admitted for further management.   Review of Systems: Review of Systems  All other systems reviewed and are negative.    As per HPI otherwise 10 point review of systems negative.   Allergies  Allergen Reactions   Levitra [Vardenafil] Other (See Comments)    Blurred vision   Aspirin Hives   Clopidogrel Other (See Comments)    Not effective anti platelet med for patient-switched to effient   Lipitor [Atorvastatin] Other (See Comments)    Muscle  cramps; patient states he does not take medication at home    Past Medical History:  Diagnosis Date   Acute myocardial infarction of other anterior wall, initial episode of care 12/29/2012   Promus stent to LAD   Acute ST segment elevation MI ([place]) 01/05/2013   secondary to thrombus in stent; Brilintta changed to Effient pt with ASA allergy   Coronary artery disease 2001   stent to LAD and patent 2007 on cath   Echocardiogram abnormal 01/06/2013   EF [id number], grade I diastolic dysfunction   Erectile dysfunction    Hyperlipidemia LDL goal <70    Hypertension    OSA (obstructive sleep apnea)    No CPAP use overnight    Past Surgical History:  Procedure Laterality Date   BACK SURGERY  2007   Decompression lumbar laminectomy and micro discectomy, L4-5   BRONCHIAL BIOPSY  12/28/2021   Procedure: BRONCHIAL BIOPSIES;  Surgeon: Collene Gobble, MD;  Location: [facility];  Service: Pulmonary;;   BRONCHIAL BRUSHINGS  12/28/2021   Procedure: BRONCHIAL BRUSHINGS;  Surgeon: Collene Gobble, MD;  Location: [facility];  Service: Pulmonary;;   BRONCHIAL NEEDLE ASPIRATION BIOPSY  12/28/2021   Procedure: BRONCHIAL NEEDLE ASPIRATION BIOPSIES;  Surgeon: Collene Gobble, MD;  Location: [place] ENDOSCOPY;  Service: Pulmonary;;   BRONCHIAL WASHINGS  12/28/2021   Procedure: BRONCHIAL WASHINGS;  Surgeon: Collene Gobble, MD;  Location: [facility] ENDOSCOPY;  Service: Pulmonary;;   CARDIAC CATHETERIZATION  2007   patent stent to LAD and normal  [place]   CARDIOVERSION N/A 05/10/2022   Procedure: CARDIOVERSION;  Surgeon: Geralynn Rile, MD;  Location: [facility];  Service: Cardiovascular;  Laterality: N/A;   CHOLECYSTECTOMY N/A 12/26/2021   Procedure: LAPAROSCOPIC CHOLECYSTECTOMY, LAPAROSCOPIC LYSIS OF ADHESIONS GREATER THAN 1 HOUR;  Surgeon: [name], MD;  Location: [facility];  Service: General;  Laterality: N/A;   CORONARY ANGIOPLASTY WITH STENT PLACEMENT  2001   to LAD   CORONARY ANGIOPLASTY WITH STENT  PLACEMENT  12/29/12   STEMI with promus DES to LAD   CORONARY ANGIOPLASTY WITH STENT PLACEMENT  01/05/13   STEMI with thrombosis in stent to LAD   FIDUCIAL MARKER PLACEMENT  12/28/2021   Procedure: FIDUCIAL MARKER PLACEMENT;  Surgeon: Collene Gobble, MD;  Location: [facility] ENDOSCOPY;  Service: Pulmonary;;   LEFT HEART CATH N/A 12/29/2012   Procedure: LEFT HEART CATH;  Surgeon: Jettie Booze, MD;  Location[facility] CATH LAB;  Service: Cardiovascular;  Laterality: N/A;   LEFT HEART CATHETERIZATION WITH CORONARY ANGIOGRAM N/A 01/05/2013   Procedure: LEFT HEART CATHETERIZATION WITH CORONARY ANGIOGRAM;  Surgeon: Blane Ohara, MD;  Location: [facility] CATH LAB;  Service: Cardiovascular;  Laterality: N/A;   LEFT HEART CATHETERIZATION WITH CORONARY ANGIOGRAM N/A 04/22/2013   Procedure: LEFT HEART CATHETERIZATION WITH CORONARY ANGIOGRAM;  Surgeon: Troy Sine, MD;  Location: [facility] CATH LAB;  Service: Cardiovascular;  Laterality: N/A;   PARTIAL NEPHRECTOMY Left 1975   PATIENT ONLY HAS ONE KIDNEY   PERCUTANEOUS CORONARY STENT INTERVENTION (PCI-S)  12/29/2012   Procedure: PERCUTANEOUS CORONARY STENT INTERVENTION (PCI-S);  Surgeon: Jettie Booze, MD;  Location: [facility] CATH LAB;  Service: Cardiovascular;;   TEE WITHOUT CARDIOVERSION N/A 05/10/2022   Procedure: TRANSESOPHAGEAL ECHOCARDIOGRAM (TEE);  Surgeon: Geralynn Rile, MD;  Location: [place];  Service: Cardiovascular;  Laterality: N/A;     reports that he quit smoking about 23 years ago. His smoking use included cigarettes. He has a 50.00 pack-year smoking history. His smokeless tobacco use includes chew. He reports that he does not currently use alcohol. He reports that he does not use drugs.  Family History  Problem Relation Age of Onset   Alzheimer's disease Mother    CAD Father     Prior to Admission medications   Medication Sig Start Date End Date Taking? Authorizing Provider  apixaban (ELIQUIS) 5 MG TABS tablet Take 1 tablet (5 mg total)  by mouth 2 (two) times daily. 05/10/22 07/09/22  Pokhrel, Corrie Mckusick, MD  buPROPion (WELLBUTRIN XL) 150 MG 24 hr tablet Take 2 tablets (300 mg total) by mouth daily. 05/10/22   Pokhrel, Corrie Mckusick, MD  clopidogrel (PLAVIX) 75 MG tablet Take 1 tablet (75 mg total) by mouth daily. 05/11/22 06/10/22  Pokhrel, Corrie Mckusick, MD  cyanocobalamin (VITAMIN B12) 1000 MCG tablet Take 1 tablet (1,000 mcg total) by mouth daily. 05/10/22 08/18/22  Pokhrel, Corrie Mckusick, MD  fluticasone (FLONASE) 50 MCG/ACT nasal spray Place 1 spray into both nostrils daily. 12/24/21   [provider]  furosemide (LASIX) 20 MG tablet Take 1 tablet (20 mg total) by mouth daily. 01/02/13   Lyda Jester M, PA-C  metoprolol tartrate (LOPRESSOR) 25 MG tablet Take 1 tablet (25 mg total) by mouth 2 (two) times daily. 05/10/22   Pokhrel, Corrie Mckusick, MD  nitroGLYCERIN (NITROSTAT) 0.4 MG SL tablet Place 1 tablet (0.4 mg total) under the tongue every 5 (five) minutes x 3 doses as needed for chest pain. NEED OV. 05/09/15   Lorretta Harp, MD  pantoprazole (PROTONIX) 40 MG tablet Take 1  tablet (40 mg total) by mouth daily. 05/11/22   Pokhrel, Corrie Mckusick, MD  PARoxetine (PAXIL) 30 MG tablet Take 30 mg by mouth daily.    [provider]  potassium chloride (KLOR-CON M) 10 MEQ tablet Take 10 mEq by mouth daily. 12/07/21   [provider]  pregabalin (LYRICA) 150 MG capsule Take 450 mg by mouth daily. 11/20/21   [provider]  QUEtiapine (SEROQUEL) 25 MG tablet Take 1 tablet (25 mg total) by mouth at bedtime. 05/10/22   Pokhrel, Corrie Mckusick, MD  ramelteon (ROZEREM) 8 MG tablet Take 1 tablet (8 mg total) by mouth at bedtime. 05/10/22   Pokhrel, Corrie Mckusick, MD  rosuvastatin (CRESTOR) 10 MG tablet Take 10 mg by mouth daily. 12/12/15   [provider]  sacubitril-valsartan (ENTRESTO) 24-26 MG Take 1 tablet by mouth 2 (two) times daily. 05/10/22   Flora Lipps, MD    Physical Exam: Vitals:   05/20/22 2030 05/20/22 2100 05/20/22 2155 05/20/22  2156  BP: (!) 145/69 (!) 154/88    Pulse: (!) 112 (!) 121 65 77  Resp: 15 14 18 14   Temp:      TempSrc:      SpO2: 98% 96% 97% 97%  Weight:      Height:       Physical Exam Constitutional:      Appearance: He is normal weight.  HENT:     Head: Normocephalic.     Nose: Nose normal.     Mouth/Throat:     Mouth: Mucous membranes are moist.     Pharynx: Oropharynx is clear.  Cardiovascular:     Rate and Rhythm: Tachycardia present. Rhythm irregular.     Pulses: Normal pulses.  Pulmonary:     Effort: Pulmonary effort is normal.     Breath sounds: Normal breath sounds.  Abdominal:     General: Abdomen is flat.  Musculoskeletal:        General: Normal range of motion.     Cervical back: Normal range of motion.  Skin:    General: Skin is warm.     Capillary Refill: Capillary refill takes less than 2 seconds.  Neurological:     General: No focal deficit present.     Mental Status: He is alert. Mental status is at baseline. He is disoriented.  Psychiatric:        Mood and Affect: Mood normal.        Labs on Admission: I have personally reviewed the patients's labs and imaging studies.  Assessment/Plan Principal Problem:   Atrial fibrillation with RVR (HCC) -Medication noncompliance - Recent hospitalization for similar complaints - Responded to diltiazem drip - EKG shows atrial flutter  Plan: Continue diltiazem drip Resume p.o. meds below Resume Eliquis  Depression-continue bupropion, Paxil, Seroquel  Hyperlipidemia-continue Crestor  Heart failure with reduced ejection fraction-continue Entresto, metoprolol  CAD-continue Plavix  Recurrent falls-will need PT evaluation.  Patient slightly altered though unclear what his baseline is    Admission status: Observation Telemetry Cardiac  Certification: The appropriate patient status for this patient is OBSERVATION. Observation status is judged to be reasonable and necessary in order to provide the required  intensity of service to ensure the patient's safety. The patient's presenting symptoms, physical exam findings, and initial radiographic and laboratory data in the context of their medical condition is felt to place them at decreased risk for further clinical deterioration. Furthermore, it is anticipated that the patient will be medically stable for discharge from the hospital within 2 midnights  of admission.     Emilee Hero MD [facility] If 7PM-7AM, please contact night-coverage [email or web address]  05/20/2022, 10:28 PM

## 2022-05-20 NOTE — H&P (Incomplete)
History and Physical    Brendan Butler [id number] DOB: [birth year] DOA: 05/20/2022  PCP: Wenda Low, MD   Chief Complaint: weakness and falls  HPI: Brendan Butler is a 72 y.o. male with medical history significant of hypertension, hyperlipidemia, CAD, CHF, obstructive sleep apnea who presented to the hospital with altered mental status and falls.  Patient was recently admitted and discharged on 3/14.  At that time he was found to have similar complaints.  In the ER he was in A-fib with RVR and given dill drip.  He was found to be in a flutter was eventually cardioverted.  Echocardiogram showed reduced EF.  He was started on goal-directed medical therapy as well as Eliquis.  Plavix was discontinued.  Patient was planned to have cardiology follow-up.  Since discharge patient is not taking any of his medications.  Family found him to not be eating and be extremely weak.  Is also had numerous falls.  In the ER he was tachycardic in the 100s.  Labs were obtained which were notable for sodium 141, potassium 3.7, creatinine 1.26, WBC 9.1, hemoglobin 12.8, troponin 14, 12, urinalysis negative for infection.  Chest x-ray was obtained which showed no acute cardiopulmonary disease.  CT head was obtained which showed no acute intracranial abnormalities.  CT chest abdomen pelvis was obtained which showed minimally displaced rib fractures and hematoma of the right flank.  Patient was started on diltiazem drip for A-fib with VR with good rate control.  He was admitted for further management.   Review of Systems: Review of Systems  All other systems reviewed and are negative.    As per HPI otherwise 10 point review of systems negative.   Allergies  Allergen Reactions  . Levitra [Vardenafil] Other (See Comments)    Blurred vision  . Aspirin Hives  . Clopidogrel Other (See Comments)    Not effective anti platelet med for patient-switched to effient  . Lipitor [Atorvastatin] Other (See Comments)     Muscle cramps; patient states he does not take medication at home    Past Medical History:  Diagnosis Date  . Acute myocardial infarction of other anterior wall, initial episode of care 12/29/2012   Promus stent to LAD  . Acute ST segment elevation MI ([place]) 01/05/2013   secondary to thrombus in stent; Brilintta changed to Effient pt with ASA allergy  . Coronary artery disease 2001   stent to LAD and patent 2007 on cath  . Echocardiogram abnormal 01/06/2013   EF Q000111Q, grade I diastolic dysfunction  . Erectile dysfunction   . Hyperlipidemia LDL goal <70   . Hypertension   . OSA (obstructive sleep apnea)    No CPAP use overnight    Past Surgical History:  Procedure Laterality Date  . BACK SURGERY  2007   Decompression lumbar laminectomy and micro discectomy, L4-5  . BRONCHIAL BIOPSY  12/28/2021   Procedure: BRONCHIAL BIOPSIES;  Surgeon: Collene Gobble, MD;  Location: [facility] ENDOSCOPY;  Service: Pulmonary;;  . BRONCHIAL BRUSHINGS  12/28/2021   Procedure: BRONCHIAL BRUSHINGS;  Surgeon: Collene Gobble, MD;  Location: [facility] ENDOSCOPY;  Service: Pulmonary;;  . BRONCHIAL NEEDLE ASPIRATION BIOPSY  12/28/2021   Procedure: BRONCHIAL NEEDLE ASPIRATION BIOPSIES;  Surgeon: Collene Gobble, MD;  Location: [facility] ENDOSCOPY;  Service: Pulmonary;;  . BRONCHIAL WASHINGS  12/28/2021   Procedure: BRONCHIAL WASHINGS;  Surgeon: Collene Gobble, MD;  Location: [facility] ENDOSCOPY;  Service: Pulmonary;;  . CARDIAC CATHETERIZATION  2007   patent stent to LAD  and normal [place]  . CARDIOVERSION N/A 05/10/2022   Procedure: CARDIOVERSION;  Surgeon: Geralynn Rile, MD;  Location: [place];  Service: Cardiovascular;  Laterality: N/A;  . CHOLECYSTECTOMY N/A 12/26/2021   Procedure: LAPAROSCOPIC CHOLECYSTECTOMY, LAPAROSCOPIC LYSIS OF ADHESIONS GREATER THAN 1 HOUR;  Surgeon: [name], MD;  Location: [place];  Service: General;  Laterality: N/A;  . CORONARY ANGIOPLASTY WITH STENT PLACEMENT  2001   to LAD  . CORONARY  ANGIOPLASTY WITH STENT PLACEMENT  12/29/12   STEMI with promus DES to LAD  . CORONARY ANGIOPLASTY WITH STENT PLACEMENT  01/05/13   STEMI with thrombosis in stent to LAD  . FIDUCIAL MARKER PLACEMENT  12/28/2021   Procedure: FIDUCIAL MARKER PLACEMENT;  Surgeon: Collene Gobble, MD;  Location: [facility] ENDOSCOPY;  Service: Pulmonary;;  . LEFT HEART CATH N/A 12/29/2012   Procedure: LEFT HEART CATH;  Surgeon: Jettie Booze, MD;  Location: [facility] CATH LAB;  Service: Cardiovascular;  Laterality: N/A;  . LEFT HEART CATHETERIZATION WITH CORONARY ANGIOGRAM N/A 01/05/2013   Procedure: LEFT HEART CATHETERIZATION WITH CORONARY ANGIOGRAM;  Surgeon: Blane Ohara, MD;  Location: [facility] CATH LAB;  Service: Cardiovascular;  Laterality: N/A;  . LEFT HEART CATHETERIZATION WITH CORONARY ANGIOGRAM N/A 04/22/2013   Procedure: LEFT HEART CATHETERIZATION WITH CORONARY ANGIOGRAM;  Surgeon: Troy Sine, MD;  Location: [facility] CATH LAB;  Service: Cardiovascular;  Laterality: N/A;  . PARTIAL NEPHRECTOMY Left 1975   PATIENT ONLY HAS ONE KIDNEY  . PERCUTANEOUS CORONARY STENT INTERVENTION (PCI-S)  12/29/2012   Procedure: PERCUTANEOUS CORONARY STENT INTERVENTION (PCI-S);  Surgeon: Jettie Booze, MD;  Location: [facility] CATH LAB;  Service: Cardiovascular;;  . TEE WITHOUT CARDIOVERSION N/A 05/10/2022   Procedure: TRANSESOPHAGEAL ECHOCARDIOGRAM (TEE);  Surgeon: Geralynn Rile, MD;  Location: [place];  Service: Cardiovascular;  Laterality: N/A;     reports that he quit smoking about 23 years ago. His smoking use included cigarettes. He has a 50.00 pack-year smoking history. His smokeless tobacco use includes chew. He reports that he does not currently use alcohol. He reports that he does not use drugs.  Family History  Problem Relation Age of Onset  . Alzheimer's disease Mother   . CAD Father     Prior to Admission medications   Medication Sig Start Date End Date Taking? Authorizing Provider  apixaban (ELIQUIS) 5 MG TABS  tablet Take 1 tablet (5 mg total) by mouth 2 (two) times daily. 05/10/22 07/09/22  Pokhrel, Corrie Mckusick, MD  buPROPion (WELLBUTRIN XL) 150 MG 24 hr tablet Take 2 tablets (300 mg total) by mouth daily. 05/10/22   Pokhrel, Corrie Mckusick, MD  clopidogrel (PLAVIX) 75 MG tablet Take 1 tablet (75 mg total) by mouth daily. 05/11/22 06/10/22  Pokhrel, Corrie Mckusick, MD  cyanocobalamin (VITAMIN B12) 1000 MCG tablet Take 1 tablet (1,000 mcg total) by mouth daily. 05/10/22 08/18/22  Pokhrel, Corrie Mckusick, MD  fluticasone (FLONASE) 50 MCG/ACT nasal spray Place 1 spray into both nostrils daily. 12/24/21   [provider]  furosemide (LASIX) 20 MG tablet Take 1 tablet (20 mg total) by mouth daily. 01/02/13   Lyda Jester M, PA-C  metoprolol tartrate (LOPRESSOR) 25 MG tablet Take 1 tablet (25 mg total) by mouth 2 (two) times daily. 05/10/22   Pokhrel, Corrie Mckusick, MD  nitroGLYCERIN (NITROSTAT) 0.4 MG SL tablet Place 1 tablet (0.4 mg total) under the tongue every 5 (five) minutes x 3 doses as needed for chest pain. NEED OV. 05/09/15   Lorretta Harp, MD  pantoprazole (PROTONIX) 40 MG tablet  Take 1 tablet (40 mg total) by mouth daily. 05/11/22   Pokhrel, Corrie Mckusick, MD  PARoxetine (PAXIL) 30 MG tablet Take 30 mg by mouth daily.    [provider]  potassium chloride (KLOR-CON M) 10 MEQ tablet Take 10 mEq by mouth daily. 12/07/21   [provider]  pregabalin (LYRICA) 150 MG capsule Take 450 mg by mouth daily. 11/20/21   [provider]  QUEtiapine (SEROQUEL) 25 MG tablet Take 1 tablet (25 mg total) by mouth at bedtime. 05/10/22   Pokhrel, Corrie Mckusick, MD  ramelteon (ROZEREM) 8 MG tablet Take 1 tablet (8 mg total) by mouth at bedtime. 05/10/22   Pokhrel, Corrie Mckusick, MD  rosuvastatin (CRESTOR) 10 MG tablet Take 10 mg by mouth daily. 12/12/15   [provider]  sacubitril-valsartan (ENTRESTO) 24-26 MG Take 1 tablet by mouth 2 (two) times daily. 05/10/22   Flora Lipps, MD    Physical Exam: Vitals:   05/20/22 2030  05/20/22 2100 05/20/22 2155 05/20/22 2156  BP: (!) 145/69 (!) 154/88    Pulse: (!) 112 (!) 121 65 77  Resp: 15 14 18 14   Temp:      TempSrc:      SpO2: 98% 96% 97% 97%  Weight:      Height:       Physical Exam Constitutional:      Appearance: He is normal weight.  HENT:     Head: Normocephalic.     Nose: Nose normal.     Mouth/Throat:     Mouth: Mucous membranes are moist.     Pharynx: Oropharynx is clear.  Cardiovascular:     Rate and Rhythm: Tachycardia present. Rhythm irregular.     Pulses: Normal pulses.  Pulmonary:     Effort: Pulmonary effort is normal.     Breath sounds: Normal breath sounds.  Abdominal:     General: Abdomen is flat.  Musculoskeletal:        General: Normal range of motion.     Cervical back: Normal range of motion.  Skin:    General: Skin is warm.     Capillary Refill: Capillary refill takes less than 2 seconds.  Neurological:     General: No focal deficit present.     Mental Status: He is alert. Mental status is at baseline.  Psychiatric:        Mood and Affect: Mood normal.        Labs on Admission: I have personally reviewed the patients's labs and imaging studies.  Assessment/Plan Principal Problem:   Atrial fibrillation with RVR (HCC) -Medication noncompliance - Recent hospitalization for similar complaints - Responded to diltiazem drip  ***   Admission status: Observation Telemetry Cardiac  Certification: The appropriate patient status for this patient is OBSERVATION. Observation status is judged to be reasonable and necessary in order to provide the required intensity of service to ensure the patient's safety. The patient's presenting symptoms, physical exam findings, and initial radiographic and laboratory data in the context of their medical condition is felt to place them at decreased risk for further clinical deterioration. Furthermore, it is anticipated that the patient will be medically stable for discharge from the  hospital within 2 midnights of admission.     Emilee Hero MD [facility] If 7PM-7AM, please contact night-coverage [email or web address]  05/20/2022, 10:28 PM

## 2022-05-20 NOTE — ED Triage Notes (Addendum)
Pt BIB EMS from home. Per EMS, pt was recently dx with Afib and is not compliant with medications. Pt has had increased falls, lethargy and confusion over the last few days. Pt currently in uncontrolled Afib. Bruising noted to right lower abd. Pt unsure of hitting head or LOC with falls. Pt A/Ox4 currently.

## 2022-05-20 NOTE — ED Provider Notes (Signed)
[facility] Provider Note   CSN: [id number] Arrival date & time: 05/20/22  1429     History {Add pertinent medical, surgical, social history, OB history to HPI:1} Chief Complaint  Patient presents with   Weakness    Brendan Butler is a 72 y.o. male.   Weakness Patient presented generalized weakness and falls.  Last few days been worsening.  Recent mission to the hospital with atrial fibrillation.  Has been noncompliant with medicine.  Is on Eliquis but really has not been taking.  Recent admission to hospital and had cardioversion back to sinus rhythm.  Reportedly has been too weak to really get out of bed.  Patient has not been eating as much.  Also has had numerous falls.  Reportedly feeling weak.  Does have ecchymosis on right posterior flank and lower chest.  Denies difficulty breathing.  Denies fevers. History comes from patient and family member.   Past Medical History:  Diagnosis Date   Acute myocardial infarction of other anterior wall, initial episode of care 12/29/2012   Promus stent to LAD   Acute ST segment elevation MI ([place]) 01/05/2013   secondary to thrombus in stent; Brilintta changed to Effient pt with ASA allergy   Coronary artery disease 2001   stent to LAD and patent 2007 on cath   Echocardiogram abnormal 01/06/2013   EF Q000111Q, grade I diastolic dysfunction   Erectile dysfunction    Hyperlipidemia LDL goal <70    Hypertension    OSA (obstructive sleep apnea)    No CPAP use overnight    Home Medications Prior to Admission medications   Medication Sig Start Date End Date Taking? Authorizing Provider  apixaban (ELIQUIS) 5 MG TABS tablet Take 1 tablet (5 mg total) by mouth 2 (two) times daily. 05/10/22 07/09/22  Pokhrel, Corrie Mckusick, MD  buPROPion (WELLBUTRIN XL) 150 MG 24 hr tablet Take 2 tablets (300 mg total) by mouth daily. 05/10/22   Pokhrel, Corrie Mckusick, MD  clopidogrel (PLAVIX) 75 MG tablet Take 1 tablet (75 mg  total) by mouth daily. 05/11/22 06/10/22  Pokhrel, Corrie Mckusick, MD  cyanocobalamin (VITAMIN B12) 1000 MCG tablet Take 1 tablet (1,000 mcg total) by mouth daily. 05/10/22 08/18/22  Pokhrel, Corrie Mckusick, MD  fluticasone (FLONASE) 50 MCG/ACT nasal spray Place 1 spray into both nostrils daily. 12/24/21   [provider]  furosemide (LASIX) 20 MG tablet Take 1 tablet (20 mg total) by mouth daily. 01/02/13   Lyda Jester M, PA-C  metoprolol tartrate (LOPRESSOR) 25 MG tablet Take 1 tablet (25 mg total) by mouth 2 (two) times daily. 05/10/22   Pokhrel, Corrie Mckusick, MD  nitroGLYCERIN (NITROSTAT) 0.4 MG SL tablet Place 1 tablet (0.4 mg total) under the tongue every 5 (five) minutes x 3 doses as needed for chest pain. NEED OV. 05/09/15   Lorretta Harp, MD  pantoprazole (PROTONIX) 40 MG tablet Take 1 tablet (40 mg total) by mouth daily. 05/11/22   Pokhrel, Corrie Mckusick, MD  PARoxetine (PAXIL) 30 MG tablet Take 30 mg by mouth daily.    [provider]  potassium chloride (KLOR-CON M) 10 MEQ tablet Take 10 mEq by mouth daily. 12/07/21   [provider]  pregabalin (LYRICA) 150 MG capsule Take 450 mg by mouth daily. 11/20/21   [provider]  QUEtiapine (SEROQUEL) 25 MG tablet Take 1 tablet (25 mg total) by mouth at bedtime. 05/10/22   Pokhrel, Corrie Mckusick, MD  ramelteon (ROZEREM) 8 MG tablet Take 1 tablet (8 mg  total) by mouth at bedtime. 05/10/22   Pokhrel, Corrie Mckusick, MD  rosuvastatin (CRESTOR) 10 MG tablet Take 10 mg by mouth daily. 12/12/15   [provider]  sacubitril-valsartan (ENTRESTO) 24-26 MG Take 1 tablet by mouth 2 (two) times daily. 05/10/22   Pokhrel, Corrie Mckusick, MD      Allergies    Levitra [vardenafil], Aspirin, Clopidogrel, and Lipitor [atorvastatin]    Review of Systems   Review of Systems  Neurological:  Positive for weakness.    Physical Exam Updated Vital Signs BP (!) 158/104 (BP Location: Left Arm)   Pulse (!) 102   Temp 98.4 F (36.9 C) (Oral)   Resp 18   Ht 5\' 10"   (1.778 m)   Wt 93.6 kg   SpO2 98%   BMI 29.61 kg/m  Physical Exam Vitals and nursing note reviewed.  Eyes:     Pupils: Pupils are equal, round, and reactive to light.  Cardiovascular:     Rate and Rhythm: Tachycardia present. Rhythm irregular.  Pulmonary:     Breath sounds: No wheezing or rhonchi.     Comments: Tenderness to right posterior chest wall with some ecchymosis and right posterior flank. Chest:     Chest wall: Tenderness present.  Abdominal:     Tenderness: There is no abdominal tenderness.     Comments: Post her right flank tenderness but no frank anterior abdominal tenderness.  Bruising right posterior flank with abrasion.  Musculoskeletal:     Right lower leg: Edema present.     Left lower leg: Edema present.  Skin:    General: Skin is warm.     Capillary Refill: Capillary refill takes less than 2 seconds.  Neurological:     Mental Status: He is alert and oriented to person, place, and time.     ED Results / Procedures / Treatments   Labs (all labs ordered are listed, but only abnormal results are displayed) Labs Reviewed  COMPREHENSIVE METABOLIC PANEL  CBC  URINALYSIS, W/ REFLEX TO CULTURE (INFECTION SUSPECTED)  TROPONIN I (HIGH SENSITIVITY)    EKG None  Radiology No results found.  Procedures Procedures  {Document cardiac monitor, telemetry assessment procedure when appropriate:1}  Medications Ordered in ED Medications - No data to display  ED Course/ Medical Decision Making/ A&P   {   Click here for ABCD2, HEART and other calculatorsREFRESH Note before signing :1}                          Medical Decision Making Amount and/or Complexity of Data Reviewed Labs: ordered. Radiology: ordered.   Patient with mental status change.  Weakness.  Noncompliance with medications.  Return to atrial flutter.  Will check basic blood work.  Will get x-ray to start with but will likely DC CT scanning of head and chest abdomen pelvis due to trauma.  With  tachycardia will require admission the hospital.  Not a cardioversion candidate because noncompliance with anticoagulation and unknown time of onset.  Reviewed recent discharge note.   {Document critical care time when appropriate:1} {Document review of labs and clinical decision tools ie heart score, Chads2Vasc2 etc:1}  {Document your independent review of radiology images, and any outside records:1} {Document your discussion with family members, caretakers, and with consultants:1} {Document social determinants of health affecting pt's care:1} {Document your decision making why or why not admission, treatments were needed:1} Final Clinical Impression(s) / ED Diagnoses Final diagnoses:  None    Rx / DC Orders  ED Discharge Orders     None

## 2022-05-20 NOTE — ED Notes (Addendum)
Pt to CT

## 2022-05-20 NOTE — ED Notes (Signed)
ED TO INPATIENT HANDOFF REPORT  ED Nurse Name and Phone #: Furkan Keenum  S Name/Age/Gender Brendan Butler 72 y.o. male Room/Bed: 027C/027C  Code Status   Code Status: Full Code  Home/SNF/Other Home Patient oriented to: self, place, time, and situation Is this baseline? Yes   Triage Complete: Triage complete  Chief Complaint Atrial fibrillation with RVR ([place]) [I48.91]  Triage Note Pt BIB EMS from home. Per EMS, pt was recently dx with Afib and is not compliant with medications. Pt has had increased falls, lethargy and confusion over the last few days. Pt currently in uncontrolled Afib. Bruising noted to right lower abd. Pt unsure of hitting head or LOC with falls. Pt A/Ox4 currently.    Allergies Allergies  Allergen Reactions   Levitra [Vardenafil] Other (See Comments)    Blurred vision   Aspirin Hives   Lipitor [Atorvastatin] Other (See Comments)    Muscle cramps; patient states he does not take medication at home    Level of Care/Admitting Diagnosis ED Disposition     ED Disposition  [place]: [place] [[id number]]  Level of Care: Telemetry Cardiac [103]  May place patient in observation at [facility] or [place] if equivalent level of care is available:: No  Covid Evaluation: Asymptomatic - no recent exposure (last 10 days) testing not required  Diagnosis: Atrial fibrillation with RVR [facility]) [id number]  Admitting Physician: Emilee Hero [id number]  Attending Physician: Emilee Hero [id number]          B Medical/Surgery History Past Medical History:  Diagnosis Date   Acute myocardial infarction of other anterior wall, initial episode of care 12/29/2012   Promus stent to LAD   Acute ST segment elevation MI ([place]) 01/05/2013   secondary to thrombus in stent; Brilintta changed to Effient pt with ASA allergy   Coronary artery disease 2001   stent to LAD and patent 2007 on cath   Echocardiogram  abnormal 01/06/2013   EF [id number], grade I diastolic dysfunction   Erectile dysfunction    Hyperlipidemia LDL goal <70    Hypertension    OSA (obstructive sleep apnea)    No CPAP use overnight   Past Surgical History:  Procedure Laterality Date   BACK SURGERY  2007   Decompression lumbar laminectomy and micro discectomy, L4-5   BRONCHIAL BIOPSY  12/28/2021   Procedure: BRONCHIAL BIOPSIES;  Surgeon: Collene Gobble, MD;  Location: [place];  Service: Pulmonary;;   BRONCHIAL BRUSHINGS  12/28/2021   Procedure: BRONCHIAL BRUSHINGS;  Surgeon: Collene Gobble, MD;  Location: [place];  Service: Pulmonary;;   BRONCHIAL NEEDLE ASPIRATION BIOPSY  12/28/2021   Procedure: BRONCHIAL NEEDLE ASPIRATION BIOPSIES;  Surgeon: Collene Gobble, MD;  Location: [facility] ENDOSCOPY;  Service: Pulmonary;;   BRONCHIAL WASHINGS  12/28/2021   Procedure: BRONCHIAL WASHINGS;  Surgeon: Collene Gobble, MD;  Location: [place] ENDOSCOPY;  Service: Pulmonary;;   CARDIAC CATHETERIZATION  2007   patent stent to LAD and normal Cors   CARDIOVERSION N/A 05/10/2022   Procedure: CARDIOVERSION;  Surgeon: Geralynn Rile, MD;  Location: [place];  Service: Cardiovascular;  Laterality: N/A;   CHOLECYSTECTOMY N/A 12/26/2021   Procedure: LAPAROSCOPIC CHOLECYSTECTOMY, LAPAROSCOPIC LYSIS OF ADHESIONS GREATER THAN 1 HOUR;  Surgeon: [name], MD;  Location: [place];  Service: General;  Laterality: N/A;   CORONARY ANGIOPLASTY WITH STENT PLACEMENT  2001   to LAD   CORONARY ANGIOPLASTY WITH STENT PLACEMENT  12/29/12  STEMI with promus DES to LAD   CORONARY ANGIOPLASTY WITH STENT PLACEMENT  01/05/13   STEMI with thrombosis in stent to LAD   FIDUCIAL MARKER PLACEMENT  12/28/2021   Procedure: FIDUCIAL MARKER PLACEMENT;  Surgeon: Collene Gobble, MD;  Location: [facility] ENDOSCOPY;  Service: Pulmonary;;   LEFT HEART CATH N/A 12/29/2012   Procedure: LEFT HEART CATH;  Surgeon: Jettie Booze, MD;  Location: [facility] CATH LAB;  Service:  Cardiovascular;  Laterality: N/A;   LEFT HEART CATHETERIZATION WITH CORONARY ANGIOGRAM N/A 01/05/2013   Procedure: LEFT HEART CATHETERIZATION WITH CORONARY ANGIOGRAM;  Surgeon: Blane Ohara, MD;  Location: [facility] CATH LAB;  Service: Cardiovascular;  Laterality: N/A;   LEFT HEART CATHETERIZATION WITH CORONARY ANGIOGRAM N/A 04/22/2013   Procedure: LEFT HEART CATHETERIZATION WITH CORONARY ANGIOGRAM;  Surgeon: Troy Sine, MD;  Location: [facility] CATH LAB;  Service: Cardiovascular;  Laterality: N/A;   PARTIAL NEPHRECTOMY Left 1975   PATIENT ONLY HAS ONE KIDNEY   PERCUTANEOUS CORONARY STENT INTERVENTION (PCI-S)  12/29/2012   Procedure: PERCUTANEOUS CORONARY STENT INTERVENTION (PCI-S);  Surgeon: Jettie Booze, MD;  Location: [facility] CATH LAB;  Service: Cardiovascular;;   TEE WITHOUT CARDIOVERSION N/A 05/10/2022   Procedure: TRANSESOPHAGEAL ECHOCARDIOGRAM (TEE);  Surgeon: Geralynn Rile, MD;  Location: [place];  Service: Cardiovascular;  Laterality: N/A;     A IV Location/Drains/Wounds Patient Lines/Drains/Airways Status     Active Line/Drains/Airways     Name Placement date Placement time Site Days   Peripheral IV 05/20/22 18 G Anterior;Distal;Right;Upper Arm 05/20/22  1535  Arm  less than 1            Intake/Output Last 24 hours No intake or output data in the 24 hours ending 05/20/22 2312  Labs/Imaging Results for orders placed or performed during the hospital encounter of 05/20/22 (from the past 48 hour(s))  Comprehensive metabolic panel     Status: Abnormal   Collection Time: 05/20/22  3:32 PM  Result Value Ref Range   Sodium 141 135 - 145 mmol/L   Potassium 3.7 3.5 - 5.1 mmol/L   Chloride 105 98 - 111 mmol/L   CO2 26 22 - 32 mmol/L   Glucose, Bld 125 (H) 70 - 99 mg/dL    Comment: Glucose reference range applies only to samples taken after fasting for at least 8 hours.   BUN 14 8 - 23 mg/dL   Creatinine, Ser 1.26 (H) 0.61 - 1.24 mg/dL   Calcium 8.8 (L) 8.9 - 10.3  mg/dL   Total Protein 6.1 (L) 6.5 - 8.1 g/dL   Albumin 2.7 (L) 3.5 - 5.0 g/dL   AST 18 15 - 41 U/L   ALT 19 0 - 44 U/L   Alkaline Phosphatase 84 38 - 126 U/L   Total Bilirubin 1.4 (H) 0.3 - 1.2 mg/dL   GFR, Estimated >60 >60 mL/min    Comment: (NOTE) Calculated using the CKD-EPI Creatinine Equation (2021)    Anion gap 10 5 - 15    Comment: Performed at [facility], [place] [address] [place] [address]  CBC     Status: Abnormal   Collection Time: 05/20/22  3:32 PM  Result Value Ref Range   WBC 9.1 4.0 - 10.5 K/uL   RBC 4.49 4.22 - 5.81 MIL/uL   Hemoglobin 12.8 (L) 13.0 - 17.0 g/dL   HCT 38.9 (L) 39.0 - 52.0 %   MCV 86.6 80.0 - 100.0 fL   MCH 28.5 26.0 - 34.0 pg  MCHC 32.9 30.0 - 36.0 g/dL   RDW 13.0 11.5 - 15.5 %   Platelets 241 150 - 400 K/uL    Comment: REPEATED TO VERIFY   nRBC 0.0 0.0 - 0.2 %    Comment: Performed at [facility], [place] [address] [place], [place] [address]  Troponin I (High Sensitivity)     Status: None   Collection Time: 05/20/22  3:32 PM  Result Value Ref Range   Troponin I (High Sensitivity) 14 <18 ng/L    Comment: (NOTE) Elevated high sensitivity troponin I (hsTnI) values and significant  changes across serial measurements may suggest ACS but many other  chronic and acute conditions are known to elevate hsTnI results.  Refer to the "Links" section for chest pain algorithms and additional  guidance. Performed at [facility], [place] [address], [place], [place] [address]   Troponin I (High Sensitivity)     Status: None   Collection Time: 05/20/22  6:20 PM  Result Value Ref Range   Troponin I (High Sensitivity) 12 <18 ng/L    Comment: (NOTE) Elevated high sensitivity troponin I (hsTnI) values and significant  changes across serial measurements may suggest ACS but many other  chronic and acute conditions are known to elevate hsTnI results.  Refer to the "Links" section for chest pain algorithms and additional   guidance. Performed at [facility], [place] [address], [place], [place] [address]   Urinalysis, w/ Reflex to Culture (Infection Suspected) -Urine, Unspecified Source     Status: Abnormal   Collection Time: 05/20/22  8:38 PM  Result Value Ref Range   Specimen Source URINE, UNSPE    Color, Urine AMBER (A) YELLOW    Comment: BIOCHEMICALS MAY BE AFFECTED BY COLOR   APPearance CLEAR CLEAR   Specific Gravity, Urine 1.034 (H) 1.005 - 1.030   pH 5.0 5.0 - 8.0   Glucose, UA NEGATIVE NEGATIVE mg/dL   Hgb urine dipstick NEGATIVE NEGATIVE   Bilirubin Urine NEGATIVE NEGATIVE   Ketones, ur NEGATIVE NEGATIVE mg/dL   Protein, ur NEGATIVE NEGATIVE mg/dL   Nitrite NEGATIVE NEGATIVE   Leukocytes,Ua NEGATIVE NEGATIVE   RBC / HPF 0-5 0 - 5 RBC/hpf   WBC, UA 0-5 0 - 5 WBC/hpf    Comment:        Reflex urine culture not performed if WBC <=10, OR if Squamous epithelial cells >5. If Squamous epithelial cells >5 suggest recollection.    Bacteria, UA NONE SEEN NONE SEEN   Squamous Epithelial / HPF 0-5 0 - 5 /HPF   Mucus PRESENT     Comment: Performed at [facility], [place] [address] [place],  [address]   CT CHEST ABDOMEN PELVIS W CONTRAST  Result Date: 05/20/2022 CLINICAL DATA:  Polytrauma, blunt EXAM: CT CHEST, ABDOMEN, AND PELVIS WITH CONTRAST TECHNIQUE: Multidetector CT imaging of the chest, abdomen and pelvis was performed following the standard protocol during bolus administration of intravenous contrast. RADIATION DOSE REDUCTION: This exam was performed according to the departmental dose-optimization program which includes automated exposure control, adjustment of the mA and/or kV according to patient size and/or use of iterative reconstruction technique. CONTRAST:  82mL ISOVUE-370 IOPAMIDOL (ISOVUE-370) INJECTION 76% COMPARISON:  PET CT 04/11/2022 FINDINGS: CHEST: Cardiovascular: No aortic injury. The thoracic aorta is normal in caliber. The heart is normal in size. No significant  pericardial effusion. Coronary artery calcification with left anterior descending coronary artery stent. Mediastinum/Nodes: No pneumomediastinum. No mediastinal hematoma. The esophagus is unremarkable.  The thyroid is unremarkable. The central airways are patent. No mediastinal, hilar, or axillary lymphadenopathy. Lungs/Pleura: Bilateral upper lobe thin walled subcentimeter cystic lesions. Chronic 3 mm metallic density within the right lower lobe (3:44). No focal consolidation. No pulmonary nodule. No pulmonary mass. No pulmonary contusion or laceration. No pneumatocele formation. Trace right pleural effusion.  No pneumothorax. No hemothorax. Musculoskeletal/Chest wall: No chest wall mass. Acute minimally displaced right posterior eleventh and twelfth ribs fractured in 2 different places. Acute minimally displaced right posterolateral ninth and eighth ribs. No spinal fracture. Surgical hardware of the left humerus. ABDOMEN / PELVIS: Hepatobiliary: Not enlarged. No focal lesion. No laceration or subcapsular hematoma. Status post cholecystectomy.  No biliary ductal dilatation. Pancreas: Normal pancreatic contour. No main pancreatic duct dilatation. Spleen: Not enlarged. No focal lesion. No laceration, subcapsular hematoma, or vascular injury. Adrenals/Urinary Tract: No nodularity bilaterally. The right kidney is homogeneously enhancing. Status post left nephrectomy. No hydronephrosis. No contusion, laceration, or subcapsular hematoma. No injury to the vascular structures or collecting systems. No hydroureter. The urinary bladder is unremarkable. Stomach/Bowel: No small or large bowel wall thickening or dilatation. Colonic diverticulosis. The appendix is unremarkable. Vasculature/Lymphatics: Severe atherosclerotic plaque. No abdominal aorta or iliac aneurysm. No active contrast extravasation or pseudoaneurysm. No abdominal, pelvic, inguinal lymphadenopathy. Reproductive: Normal. Other: No simple free fluid ascites. No  pneumoperitoneum. No hemoperitoneum. No mesenteric hematoma identified. No organized fluid collection. Musculoskeletal: Subcutaneus soft tissue edema and hematoma of the right flank. No acute pelvic fracture.  Chronic stable L1 compression fracture. Ports and Devices: None. IMPRESSION: 1. Acute minimally displaced right posterior 11th and 12th rib fractures in 2 different places. Acute minimally displaced right posterolateral 8th and 9th ribs. Associated subcutaneus soft tissue edema and hematoma of the right flank. 2. Trace right pleural effusion. 3. No acute intra-abdominal or intrapelvic traumatic injury. 4. No acute fracture or traumatic malalignment of the thoracic or lumbar spine. 5. Other imaging findings of potential clinical significance: Status post left nephrectomy. Aortic Atherosclerosis (ICD10-I70.0). Electronically Signed   By: Iven Finn M.D.   On: 05/20/2022 21:08   CT HEAD WO CONTRAST (5MM)  Result Date: 05/20/2022 CLINICAL DATA:  Head trauma, minor (Age >= 65y) EXAM: CT HEAD WITHOUT CONTRAST TECHNIQUE: Contiguous axial images were obtained from the base of the skull through the vertex without intravenous contrast. RADIATION DOSE REDUCTION: This exam was performed according to the departmental dose-optimization program which includes automated exposure control, adjustment of the mA and/or kV according to patient size and/or use of iterative reconstruction technique. COMPARISON:  MRI head 05/07/2022, CT head 05/06/2022 FINDINGS: Brain: Patchy and confluent areas of decreased attenuation are noted throughout the deep and periventricular white matter of the cerebral hemispheres bilaterally, compatible with chronic microvascular ischemic disease. No evidence of large-territorial acute infarction. No parenchymal hemorrhage. No mass lesion. No extra-axial collection. No mass effect or midline shift. No hydrocephalus. Basilar cisterns are patent. Vascular: No hyperdense vessel. Atherosclerotic  calcifications are present within the cavernous internal carotid arteries. Skull: No acute fracture or focal lesion. Sinuses/Orbits: Paranasal sinuses and mastoid air cells are clear. The orbits are unremarkable. Other: None. IMPRESSION: No acute intracranial abnormality. Electronically Signed   By: Iven Finn M.D.   On: 05/20/2022 19:41   DG Chest Portable 1 View  Result Date: 05/20/2022 CLINICAL DATA:  Fall. EXAM: PORTABLE CHEST 1 VIEW COMPARISON:  May 06, 2022. FINDINGS: The heart size and mediastinal contours are within normal limits. Both lungs are clear. The visualized skeletal structures are unremarkable. IMPRESSION:  No active disease. Electronically Signed   By: Marijo Conception M.D.   On: 05/20/2022 16:17    Pending Labs Unresulted Labs (From admission, onward)     Start     Ordered   05/21/22 [id number]  Basic metabolic panel  Tomorrow morning,   R        05/20/22 2225   05/21/22 0500  CBC  Tomorrow morning,   R        05/20/22 2225   05/21/22 0500  Magnesium  Tomorrow morning,   R        05/20/22 2225            Vitals/Pain Today's Vitals   05/20/22 2100 05/20/22 2155 05/20/22 2156 05/20/22 2307  BP: (!) 154/88   (!) 155/81  Pulse: (!) 121 65 77 (!) 107  Resp: 14 18 14 16   Temp:    97.7 F (36.5 C)  TempSrc:    Oral  SpO2: 96% 97% 97% 96%  Weight:      Height:      PainSc:        Isolation Precautions No active isolations  Medications Medications  diltiazem (CARDIZEM) 1 mg/mL load via infusion 10 mg (10 mg Intravenous Bolus from Bag 05/20/22 1815)    And  diltiazem (CARDIZEM) 125 mg in dextrose 5% 125 mL (1 mg/mL) infusion (12.5 mg/hr Intravenous Rate/Dose Change 05/20/22 2122)  metoprolol tartrate (LOPRESSOR) tablet 25 mg (25 mg Oral Given 05/20/22 2307)  rosuvastatin (CRESTOR) tablet 10 mg (has no administration in time range)  sacubitril-valsartan (ENTRESTO) 24-26 mg per tablet (1 tablet Oral Given 05/20/22 2307)  buPROPion (WELLBUTRIN XL) 24 hr tablet 300  mg (has no administration in time range)  PARoxetine (PAXIL) tablet 30 mg (has no administration in time range)  QUEtiapine (SEROQUEL) tablet 25 mg (25 mg Oral Given 05/20/22 2307)  pantoprazole (PROTONIX) EC tablet 40 mg (has no administration in time range)  apixaban (ELIQUIS) tablet 5 mg (5 mg Oral Given 05/20/22 2308)  clopidogrel (PLAVIX) tablet 75 mg (has no administration in time range)  pregabalin (LYRICA) capsule 450 mg (has no administration in time range)  acetaminophen (TYLENOL) tablet 650 mg (has no administration in time range)  ondansetron (ZOFRAN) injection 4 mg (has no administration in time range)  iopamidol (ISOVUE-370) 76 % injection 75 mL (75 mLs Intravenous Contrast Given 05/20/22 1907)    Mobility walks with person assist     Focused Assessments     R Recommendations: See Admitting Provider Note  Report given to:   Additional Notes:

## 2022-05-21 ENCOUNTER — Inpatient Hospital Stay (HOSPITAL_COMMUNITY): Payer: Medicare HMO

## 2022-05-21 DIAGNOSIS — G9340 Encephalopathy, unspecified: Secondary | ICD-10-CM | POA: Diagnosis not present

## 2022-05-21 DIAGNOSIS — I2510 Atherosclerotic heart disease of native coronary artery without angina pectoris: Secondary | ICD-10-CM | POA: Diagnosis not present

## 2022-05-21 DIAGNOSIS — I509 Heart failure, unspecified: Secondary | ICD-10-CM | POA: Diagnosis not present

## 2022-05-21 DIAGNOSIS — N1831 Chronic kidney disease, stage 3a: Secondary | ICD-10-CM | POA: Diagnosis not present

## 2022-05-21 DIAGNOSIS — I700 Atherosclerosis of aorta: Secondary | ICD-10-CM | POA: Diagnosis not present

## 2022-05-21 DIAGNOSIS — I255 Ischemic cardiomyopathy: Secondary | ICD-10-CM | POA: Diagnosis present

## 2022-05-21 DIAGNOSIS — E8809 Other disorders of plasma-protein metabolism, not elsewhere classified: Secondary | ICD-10-CM | POA: Diagnosis present

## 2022-05-21 DIAGNOSIS — I4891 Unspecified atrial fibrillation: Secondary | ICD-10-CM | POA: Diagnosis present

## 2022-05-21 DIAGNOSIS — I4892 Unspecified atrial flutter: Secondary | ICD-10-CM | POA: Diagnosis not present

## 2022-05-21 DIAGNOSIS — Z79899 Other long term (current) drug therapy: Secondary | ICD-10-CM | POA: Diagnosis not present

## 2022-05-21 DIAGNOSIS — I252 Old myocardial infarction: Secondary | ICD-10-CM | POA: Diagnosis not present

## 2022-05-21 DIAGNOSIS — R627 Adult failure to thrive: Secondary | ICD-10-CM | POA: Diagnosis present

## 2022-05-21 DIAGNOSIS — R4182 Altered mental status, unspecified: Secondary | ICD-10-CM | POA: Diagnosis not present

## 2022-05-21 DIAGNOSIS — F32A Depression, unspecified: Secondary | ICD-10-CM | POA: Diagnosis not present

## 2022-05-21 DIAGNOSIS — I5022 Chronic systolic (congestive) heart failure: Secondary | ICD-10-CM | POA: Diagnosis not present

## 2022-05-21 DIAGNOSIS — R410 Disorientation, unspecified: Secondary | ICD-10-CM

## 2022-05-21 DIAGNOSIS — Z8249 Family history of ischemic heart disease and other diseases of the circulatory system: Secondary | ICD-10-CM | POA: Diagnosis not present

## 2022-05-21 DIAGNOSIS — F05 Delirium due to known physiological condition: Secondary | ICD-10-CM | POA: Diagnosis not present

## 2022-05-21 DIAGNOSIS — E876 Hypokalemia: Secondary | ICD-10-CM | POA: Diagnosis not present

## 2022-05-21 DIAGNOSIS — R296 Repeated falls: Secondary | ICD-10-CM | POA: Diagnosis not present

## 2022-05-21 DIAGNOSIS — W19XXXA Unspecified fall, initial encounter: Secondary | ICD-10-CM | POA: Diagnosis present

## 2022-05-21 DIAGNOSIS — Z7189 Other specified counseling: Secondary | ICD-10-CM | POA: Diagnosis not present

## 2022-05-21 DIAGNOSIS — S2241XA Multiple fractures of ribs, right side, initial encounter for closed fracture: Secondary | ICD-10-CM | POA: Diagnosis not present

## 2022-05-21 DIAGNOSIS — I483 Typical atrial flutter: Secondary | ICD-10-CM | POA: Diagnosis present

## 2022-05-21 DIAGNOSIS — I340 Nonrheumatic mitral (valve) insufficiency: Secondary | ICD-10-CM | POA: Diagnosis not present

## 2022-05-21 DIAGNOSIS — I4819 Other persistent atrial fibrillation: Secondary | ICD-10-CM | POA: Diagnosis not present

## 2022-05-21 DIAGNOSIS — Z955 Presence of coronary angioplasty implant and graft: Secondary | ICD-10-CM | POA: Diagnosis not present

## 2022-05-21 DIAGNOSIS — I495 Sick sinus syndrome: Secondary | ICD-10-CM | POA: Diagnosis present

## 2022-05-21 DIAGNOSIS — I130 Hypertensive heart and chronic kidney disease with heart failure and stage 1 through stage 4 chronic kidney disease, or unspecified chronic kidney disease: Secondary | ICD-10-CM | POA: Diagnosis not present

## 2022-05-21 DIAGNOSIS — E785 Hyperlipidemia, unspecified: Secondary | ICD-10-CM | POA: Diagnosis present

## 2022-05-21 DIAGNOSIS — Z515 Encounter for palliative care: Secondary | ICD-10-CM | POA: Diagnosis not present

## 2022-05-21 DIAGNOSIS — I110 Hypertensive heart disease with heart failure: Secondary | ICD-10-CM | POA: Diagnosis not present

## 2022-05-21 DIAGNOSIS — E875 Hyperkalemia: Secondary | ICD-10-CM | POA: Diagnosis present

## 2022-05-21 DIAGNOSIS — G9341 Metabolic encephalopathy: Secondary | ICD-10-CM | POA: Diagnosis not present

## 2022-05-21 DIAGNOSIS — D72829 Elevated white blood cell count, unspecified: Secondary | ICD-10-CM | POA: Diagnosis present

## 2022-05-21 DIAGNOSIS — R0602 Shortness of breath: Secondary | ICD-10-CM | POA: Diagnosis not present

## 2022-05-21 DIAGNOSIS — Z886 Allergy status to analgesic agent status: Secondary | ICD-10-CM | POA: Diagnosis not present

## 2022-05-21 DIAGNOSIS — Z87891 Personal history of nicotine dependence: Secondary | ICD-10-CM | POA: Diagnosis not present

## 2022-05-21 LAB — CBC
HCT: 38.8 % — ABNORMAL LOW (ref 39.0–52.0)
Hemoglobin: 13 g/dL (ref 13.0–17.0)
MCH: 28.9 pg (ref 26.0–34.0)
MCHC: 33.5 g/dL (ref 30.0–36.0)
MCV: 86.2 fL (ref 80.0–100.0)
Platelets: 242 10*3/uL (ref 150–400)
RBC: 4.5 MIL/uL (ref 4.22–5.81)
RDW: 13 % (ref 11.5–15.5)
WBC: 9.8 10*3/uL (ref 4.0–10.5)
nRBC: 0 % (ref 0.0–0.2)

## 2022-05-21 LAB — HEPATIC FUNCTION PANEL
ALT: 20 U/L (ref 0–44)
AST: 19 U/L (ref 15–41)
Albumin: 2.7 g/dL — ABNORMAL LOW (ref 3.5–5.0)
Alkaline Phosphatase: 73 U/L (ref 38–126)
Bilirubin, Direct: 0.2 mg/dL (ref 0.0–0.2)
Indirect Bilirubin: 1.1 mg/dL — ABNORMAL HIGH (ref 0.3–0.9)
Total Bilirubin: 1.3 mg/dL — ABNORMAL HIGH (ref 0.3–1.2)
Total Protein: 5.8 g/dL — ABNORMAL LOW (ref 6.5–8.1)

## 2022-05-21 LAB — BASIC METABOLIC PANEL
Anion gap: 8 (ref 5–15)
BUN: 15 mg/dL (ref 8–23)
CO2: 22 mmol/L (ref 22–32)
Calcium: 8.5 mg/dL — ABNORMAL LOW (ref 8.9–10.3)
Chloride: 107 mmol/L (ref 98–111)
Creatinine, Ser: 1.17 mg/dL (ref 0.61–1.24)
GFR, Estimated: >60 mL/min (ref >60)
Glucose, Bld: 112 mg/dL — ABNORMAL HIGH (ref 70–99)
Potassium: 3.6 mmol/L (ref 3.5–5.1)
Sodium: 137 mmol/L (ref 135–145)

## 2022-05-21 LAB — GLUCOSE, CAPILLARY: Glucose-Capillary: 109 mg/dL — ABNORMAL HIGH (ref 70–99)

## 2022-05-21 LAB — MAGNESIUM: Magnesium: 1.8 mg/dL (ref 1.7–2.4)

## 2022-05-21 LAB — PHOSPHORUS: Phosphorus: 3.7 mg/dL (ref 2.5–4.6)

## 2022-05-21 MED ORDER — AMIODARONE LOAD VIA INFUSION
150.0000 mg | Freq: Once | INTRAVENOUS | Status: AC
  Administered 2022-05-21: 150 mg via INTRAVENOUS
  Filled 2022-05-21: qty 83.34

## 2022-05-21 MED ORDER — PREGABALIN 25 MG PO CAPS
150.0000 mg | ORAL_CAPSULE | Freq: Three times a day (TID) | ORAL | Status: DC
  Administered 2022-05-22 – 2022-05-26 (×13): 150 mg via ORAL
  Filled 2022-05-21 (×14): qty 2

## 2022-05-21 MED ORDER — EMPAGLIFLOZIN 10 MG PO TABS
10.0000 mg | ORAL_TABLET | Freq: Every day | ORAL | Status: DC
  Administered 2022-05-21 – 2022-05-29 (×9): 10 mg via ORAL
  Filled 2022-05-21 (×10): qty 1

## 2022-05-21 MED ORDER — AMIODARONE HCL IN DEXTROSE 360-4.14 MG/200ML-% IV SOLN
60.0000 mg/h | INTRAVENOUS | Status: DC
  Administered 2022-05-21: 60 mg/h via INTRAVENOUS

## 2022-05-21 MED ORDER — METOPROLOL TARTRATE 50 MG PO TABS
50.0000 mg | ORAL_TABLET | Freq: Two times a day (BID) | ORAL | Status: AC
  Administered 2022-05-21 – 2022-05-22 (×3): 50 mg via ORAL
  Filled 2022-05-21 (×3): qty 1

## 2022-05-21 MED ORDER — MAGNESIUM SULFATE 2 GM/50ML IV SOLN
2.0000 g | Freq: Once | INTRAVENOUS | Status: AC
  Administered 2022-05-21: 2 g via INTRAVENOUS
  Filled 2022-05-21: qty 50

## 2022-05-21 MED ORDER — AMIODARONE HCL IN DEXTROSE 360-4.14 MG/200ML-% IV SOLN
30.0000 mg/h | INTRAVENOUS | Status: DC
  Filled 2022-05-21: qty 200

## 2022-05-21 NOTE — Evaluation (Signed)
Physical Therapy Evaluation Patient Details Name: Brendan Butler MRN: [id number] DOB: 04-11-1950 Today's Date: 05/21/2022  History of Present Illness  Pt is a 72 y.o. male who presented from home to the ED with AMS and falls. He had a recent hospitalization 3/10-3/14 with similar presentation and diagnosed with a fib with RVR. PMH: HTN, hyperlipidemia, CAD, CHF, obstructive sleep apnea   Clinical Impression  Pt admitted with above diagnosis. PTA pt lived at home alone, independent mobility and driving. Pt currently with functional limitations due to the deficits listed below (see PT Problem List). On eval, pt required min guard assist transfers and ambulation 300' without AD. Assist provided due to safety, but no LOB or unsteadiness noted. Pt is very confused. Unsure of his baseline cognition, as no family present. Pt very pleasant and cooperative. Poor safety awareness. Disoriented to time and situation. Pt will benefit from acute skilled PT to increase their independence and safety with mobility to allow discharge.          Recommendations for follow up therapy are one component of a multi-disciplinary discharge planning process, led by the attending physician.  Recommendations may be updated based on patient status, additional functional criteria and insurance authorization.  Follow Up Recommendations       Assistance Recommended at Discharge Frequent or constant Supervision/Assistance  Patient can return home with the following  Assist for transportation;Assistance with cooking/housework;Direct supervision/assist for medications management;Help with stairs or ramp for entrance    Equipment Recommendations None recommended by PT  Recommendations for Other Services       Functional Status Assessment Patient has had a recent decline in their functional status and demonstrates the ability to make significant improvements in function in a reasonable and predictable amount of time.      Precautions / Restrictions Precautions Precautions: Fall      Mobility  Bed Mobility Overal bed mobility: Modified Independent                  Transfers Overall transfer level: Needs assistance Equipment used: None Transfers: Sit to/from Stand Sit to Stand: Min guard           General transfer comment: min guard for safety    Ambulation/Gait Ambulation/Gait assistance: Min guard Gait Distance (Feet): 300 Feet Assistive device: None Gait Pattern/deviations: Step-through pattern, Decreased stride length Gait velocity: decreased Gait velocity interpretation: 1.31 - 2.62 ft/sec, indicative of limited community ambulator   General Gait Details: min guard for safety, no LOB noted  Stairs            Wheelchair Mobility    Modified Rankin (Stroke Patients Only)       Balance Overall balance assessment: Needs assistance Sitting-balance support: No upper extremity supported, Feet supported Sitting balance-Leahy Scale: Good     Standing balance support: No upper extremity supported, During functional activity Standing balance-Leahy Scale: Fair                               Pertinent Vitals/Pain Pain Assessment Pain Assessment: No/denies pain    Home Living Family/patient expects to be discharged to:: Private residence Living Arrangements: Alone Available Help at Discharge: Family;Available 24 hours/day;Friend(s) Type of Home: House Home Access: Stairs to enter Entrance Stairs-Rails: Can reach both;Left;Right Entrance Stairs-Number of Steps: 3   Home Layout: One level Home Equipment: Conservation officer, nature (2 wheels);Rollator (4 wheels);Standard Walker;Cane - single point;Wheelchair - manual;Other (comment);Grab bars - tub/shower;Hand held shower  head Additional Comments: History provided by pt. Unsure of validity due to confusion.    Prior Function Prior Level of Function : Independent/Modified Independent;Driving;History of Falls (last  six months)             Mobility Comments: multiple recent falls, no AD at baseline. Pt reports he still drives, including riding his motorcycle. ADLs Comments: Pt reports doing his own yardwork and grocery shopping.     Hand Dominance        Extremity/Trunk Assessment   Upper Extremity Assessment Upper Extremity Assessment: Defer to OT evaluation    Lower Extremity Assessment Lower Extremity Assessment: Overall WFL for tasks assessed    Cervical / Trunk Assessment Cervical / Trunk Assessment: Normal  Communication   Communication: No difficulties  Cognition Arousal/Alertness: Awake/alert Behavior During Therapy: WFL for tasks assessed/performed Overall Cognitive Status: Impaired/Different from baseline Area of Impairment: Orientation, Attention, Memory, Following commands, Safety/judgement, Awareness, Problem solving                 Orientation Level: Disoriented to, Situation, Time Current Attention Level: Selective Memory: Decreased short-term memory Following Commands: Follows one step commands consistently Safety/Judgement: Decreased awareness of safety, Decreased awareness of deficits Awareness: Intellectual Problem Solving: Difficulty sequencing, Requires verbal cues General Comments: Unable to state month or year. When asked "when is your birthday?" pt states "3 days after Christmas" but unable to give actual date.        General Comments General comments (skin integrity, edema, etc.): HR in 70s and 80s during mobility. SpO2 99-100% on RA.    Exercises     Assessment/Plan    PT Assessment Patient needs continued PT services  PT Problem List Decreased balance;Decreased cognition;Decreased knowledge of precautions;Decreased mobility;Decreased safety awareness       PT Treatment Interventions Functional mobility training;Balance training;Patient/family education;Gait training;Therapeutic activities;Stair training;Therapeutic exercise;Cognitive  remediation    PT Goals (Current goals can be found in the Care Plan section)  Acute Rehab PT Goals Patient Stated Goal: home PT Goal Formulation: With patient Time For Goal Achievement: 06/04/22 Potential to Achieve Goals: Good    Frequency Min 1X/week     Co-evaluation               AM-PAC PT "6 Clicks" Mobility  Outcome Measure Help needed turning from your back to your side while in a flat bed without using bedrails?: None Help needed moving from lying on your back to sitting on the side of a flat bed without using bedrails?: None Help needed moving to and from a bed to a chair (including a wheelchair)?: A Little Help needed standing up from a chair using your arms (e.g., wheelchair or bedside chair)?: A Little Help needed to walk in hospital room?: A Little Help needed climbing 3-5 steps with a railing? : A Little 6 Click Score: 20    End of Session Equipment Utilized During Treatment: Gait belt Activity Tolerance: Patient tolerated treatment well Patient left: in bed;with call bell/phone within reach;with bed alarm set Nurse Communication: Mobility status PT Visit Diagnosis: Difficulty in walking, not elsewhere classified (R26.2);Repeated falls (R29.6)    Time: [id number] PT Time Calculation (min) (ACUTE ONLY): 17 min   Charges:   PT Evaluation $PT Eval Moderate Complexity: 1 Mod          Gloriann Loan., PT  Office # [phone]   Lorriane Shire 05/21/2022, 8:26 AM

## 2022-05-21 NOTE — Evaluation (Signed)
Occupational Therapy Evaluation Patient Details Name: Brendan Butler MRN: [id number] DOB: 1950/03/15 Today's Date: 05/21/2022   History of Present Illness Pt is a 72 y.o. male who presented from home to the ED with AMS and falls. He had a recent hospitalization 3/10-3/14 with similar presentation and diagnosed with a fib with RVR. PMH: HTN, hyperlipidemia, CAD, CHF, obstructive sleep apnea   Clinical Impression   Patient admitted for the diagnosis above.  PTA he lives at home, but has several daughter's that are in the area, assist as needed.  Patient stating he is Ind with ADL, iADL, community mobility, medications and bill payment.  Currently presents with significant confusion impacting safety and ADL independence.  OT is indicated in the acute setting to address deficits, and assist with transition to next level when cleared by MD.        Recommendations for follow up therapy are one component of a multi-disciplinary discharge planning process, led by the attending physician.  Recommendations may be updated based on patient status, additional functional criteria and insurance authorization.   Assistance Recommended at Discharge Frequent or constant Supervision/Assistance  Patient can return home with the following Assist for transportation;Assistance with cooking/housework;Direct supervision/assist for financial management;Direct supervision/assist for medications management    Functional Status Assessment  Patient has had a recent decline in their functional status and demonstrates the ability to make significant improvements in function in a reasonable and predictable amount of time.  Equipment Recommendations  None recommended by OT    Recommendations for Other Services       Precautions / Restrictions Precautions Precautions: Fall Restrictions Weight Bearing Restrictions: No      Mobility Bed Mobility Overal bed mobility: Modified Independent                   Transfers Overall transfer level: Needs assistance Equipment used: None Transfers: Sit to/from Stand Sit to Stand: Supervision                  Balance Overall balance assessment: Needs assistance Sitting-balance support: No upper extremity supported, Feet supported Sitting balance-Leahy Scale: Good     Standing balance support: No upper extremity supported, During functional activity Standing balance-Leahy Scale: Fair                             ADL either performed or assessed with clinical judgement   ADL       Grooming: Wash/dry hands;Wash/dry face;Oral care;Supervision/safety;Standing               Lower Body Dressing: Supervision/safety;Sit to/from stand   Toilet Transfer: Aeronautical engineer;Ambulation                   Vision Patient Visual Report: No change from baseline       Perception     Praxis      Pertinent Vitals/Pain Pain Assessment Pain Assessment: No/denies pain     Hand Dominance Right   Extremity/Trunk Assessment Upper Extremity Assessment Upper Extremity Assessment: Overall WFL for tasks assessed   Lower Extremity Assessment Lower Extremity Assessment: Defer to PT evaluation   Cervical / Trunk Assessment Cervical / Trunk Assessment: Normal   Communication Communication Communication: No difficulties   Cognition Arousal/Alertness: Awake/alert Behavior During Therapy: WFL for tasks assessed/performed Overall Cognitive Status: Impaired/Different from baseline Area of Impairment: Orientation, Attention, Memory, Following commands, Safety/judgement, Awareness, Problem solving  Orientation Level: Disoriented to, Situation, Time Current Attention Level: Selective Memory: Decreased short-term memory Following Commands: Follows one step commands consistently Safety/Judgement: Decreased awareness of safety, Decreased awareness of deficits Awareness:  Intellectual Problem Solving: Difficulty sequencing, Requires verbal cues       General Comments  VSS on RA.    Exercises     Shoulder Instructions      Home Living Family/patient expects to be discharged to:: Private residence Living Arrangements: Alone Available Help at Discharge: Family;Available 24 hours/day;Friend(s) Type of Home: House Home Access: Stairs to enter [employer] of Steps: 3 Entrance Stairs-Rails: Can reach both;Left;Right Home Layout: One level     Bathroom Shower/Tub: Teacher, early years/pre: Handicapped height Bathroom Accessibility: Yes   Home Equipment: Conservation officer, nature (2 wheels);Rollator (4 wheels);Standard Walker;Cane - single point;Wheelchair - manual;Other (comment);Grab bars - tub/shower;Hand held shower head   Additional Comments: History provided by pt. Unsure of validity due to confusion.      Prior Functioning/Environment Prior Level of Function : Independent/Modified Independent;Driving;History of Falls (last six months)             Mobility Comments: multiple recent falls, no AD at baseline. Pt reports he still drives, including riding his motorcycle. ADLs Comments: Pt reports doing his own yardwork and grocery shopping.        OT Problem List: Impaired balance (sitting and/or standing);Decreased safety awareness;Decreased cognition      OT Treatment/Interventions: Self-care/ADL training;Therapeutic activities;Cognitive remediation/compensation;Balance training;Patient/family education    OT Goals(Current goals can be found in the care plan section) Acute Rehab OT Goals Patient Stated Goal: Return home OT Goal Formulation: With patient Time For Goal Achievement: 06/04/22 Potential to Achieve Goals: Good ADL Goals Pt Will Perform Grooming: Independently;standing Pt Will Perform Lower Body Dressing: Independently;sit to/from stand Pt Will Transfer to Toilet: Independently;ambulating;regular height toilet   OT Frequency: Min 2X/week    Co-evaluation              AM-PAC OT "6 Clicks" Daily Activity     Outcome Measure Help from another person eating meals?: None Help from another person taking care of personal grooming?: A Little Help from another person toileting, which includes using toliet, bedpan, or urinal?: A Little Help from another person bathing (including washing, rinsing, drying)?: A Little Help from another person to put on and taking off regular upper body clothing?: None Help from another person to put on and taking off regular lower body clothing?: A Little 6 Click Score: 20   End of Session Equipment Utilized During Treatment: Gait belt Nurse Communication: Mobility status  Activity Tolerance: Patient tolerated treatment well Patient left: in bed;with call bell/phone within reach;with bed alarm set  OT Visit Diagnosis: Unsteadiness on feet (R26.81);Other symptoms and signs involving cognitive function                Time: [id number] OT Time Calculation (min): 16 min Charges:  OT General Charges $OT Visit: 1 Visit OT Evaluation $OT Eval Moderate Complexity: 1 Mod  05/21/2022  RP, OTR/L  Acute Rehabilitation Services  Office:  [phone]   Metta Clines 05/21/2022, 11:28 AM

## 2022-05-21 NOTE — Telephone Encounter (Signed)
Third attempt to reach patient was unsuccessful. No DPR on file, left final message asking that he call office. Will route back to [facility] to make aware. Doesn't look like he followed up here or PCP office, but did have a CT done at [facility] yesterday.

## 2022-05-21 NOTE — Progress Notes (Signed)
PROGRESS NOTE    Brendan Butler  [id number] DOB: 17-Nov-1950 DOA: 05/20/2022 PCP: Wenda Low, MD   Brief Narrative:   Brendan Butler is a 72 y.o. male with medical history significant of hypertension, hyperlipidemia, CAD, CHF, obstructive sleep apnea who presented to the hospital with altered mental status and falls.  Patient was recently admitted and discharged on 3/14.  At that time he was found to have similar complaints.  In the ER he was in A-fib with RVR and given dill drip.  He was found to be in a flutter was eventually cardioverted.  Echocardiogram showed reduced EF.  He was started on goal-directed medical therapy as well as Eliquis.  Plavix was discontinued.  Patient was planned to have cardiology follow-up.  Since discharge patient is not taking any of his medications.  Family found him to not be eating and be extremely weak.  Is also had numerous falls.  In the ER he was tachycardic in the 100s.  Labs were obtained which were notable for sodium 141, potassium 3.7, creatinine 1.26, WBC 9.1, hemoglobin 12.8, troponin 14, 12, urinalysis negative for infection.  Chest x-ray was obtained which showed no acute cardiopulmonary disease.  CT head was obtained which showed no acute intracranial abnormalities.  CT chest abdomen pelvis was obtained which showed minimally displaced rib fractures and hematoma of the right flank.  Patient was started on diltiazem drip for aflutter with good rate control.  He was admitted for further management.   **Stopped his diltiazem drip given his low EF noted.  Cardiology was consulted and he was initiated on IV amiodarone but this was held then.  He has had prior hospitalizations and he left AMA and continues to have inconsistent medication adherence after his cardioversion from last hospitalization.  Remains significantly confused and has been worsening so neurology has been consulted for further evaluation.  The cardiologist have held his IV amiodarone  given his inconsistent anticoagulant use and current hemodynamic stability.  They are uptitrating his metoprolol and if his A-flutter does not convert in the next days they are going to Consider TEE cardioversion however it is not able to adhere to anticoagulation it may not be worthwhile.  Patient had a full workup for his acute delirium within the last month and neurology has been consulted and recommending LTM EEG.   Assessment and Plan:  Atrial fibrillation/flutter with RVR (HCC) -Has some medication noncompliance -Recent hospitalization for similar complaints -Responded to diltiazem drip however I stopped Dilt drip given his low EF noted on his TEE and have initiated IV amiodarone and gave him a bolus but cardiology has now held his drip - EKG shows atrial flutter -Continue anticoagulation with apixaban -PT OT evaluated and recommending SNF if the family cannot provide 24-hour supervision   Depression -continue bupropion, Paxil, Seroquel   Hyperlipidemia -Continue rosuvastatin 10 mg p.o. daily   Heart failure with reduced ejection fraction -continue Entresto, metoprolol -Also continue Jardiance -Strict I's and O's and daily weights;  Intake/Output Summary (Last 24 hours) at 05/21/2022 2023 Last data filed at 05/21/2022 1500 Gross per 24 hour  Intake 361.75 ml  Output --  Net 361.75 ml  -Continue to monitor for signs and symptoms of volume overload; no overt volume overload noted  Falls with multiple fractures -Having multiple falls and could be in the setting of his a flutter -PT OT recommending SNF if the patient's family cannot provide 24-hour care   CAD status post multiple PCI's to the LAD in  2014 -continue Plavix, metoprolol tartrate, Entresto as well as rosuvastatin -Cannot use aspirin due to documented allergy -Cardiology consulted and will continue metoprolol tartrate but this has been uptitrated to 50 mg p.o. twice daily   Recurrent falls -will need PT  evaluation.  Patient slightly altered though unclear what his baseline is as it has likely been worsening  Confusion and acute encephalopathy with likely progressive Cognitive Decline -Added workup last hospitalization as pretty extensive -Neurology consulted here for this time and recommending LTM EEG -Head CT done here this hospitalization showed no acute intracranial abnormality -UA is negative for infection -He is unable to safely care for himself at home -Psychiatry was consulted last admission -Neurology will see the patient in consultation for further evaluation recommendations  CKD Stage II -BUN/Cr Trend: Recent Labs  Lab 05/07/22 0200 05/07/22 0940 05/08/22 0516 05/09/22 0147 05/10/22 0142 05/20/22 1532 05/21/22 0113  BUN 23 21 16 12 15 14 15   CREATININE 1.72* 1.71* 1.34* 1.32* 1.41* 1.26* 1.17  -Avoid Nephrotoxic Medications, Contrast Dyes, Hypotension and Dehydration to Ensure Adequate Renal Perfusion and will need to Renally Adjust Meds -Continue to Monitor and Trend Renal Function carefully and repeat CMP in the AM    Hypoalbuminemia -Patient's Albumin Trend: Recent Labs  Lab 05/20/22 1532 05/21/22 0113  ALBUMIN 2.7* 2.7*  -Continue to Monitor and Trend and repeat CMP in the AM   DVT prophylaxis: Place TED hose Start: 05/20/22 2225 apixaban (ELIQUIS) tablet 5 mg    Code Status: Full Code Family Communication: No family at bedside  Disposition Plan:  Level of care: Telemetry Cardiac Status is: Observation The patient will require care spanning > 2 midnights and should be moved to inpatient because: He needs further workup and care and evaluation   Consultants:  Cardiology Neurology  Procedures:  As delineated as above  Antimicrobials:  Anti-infectives (From admission, onward)    None       Subjective: Seen and examined at bedside and he remains forgetful but was also confused on nurse examination.  Unable to tell me when he was born.  Knew  that the president is By[place].  No chest pain or shortness of breath currently.  Feels weak.  No other concerns or complaints at this time.  Objective: Vitals:   05/21/22 0704 05/21/22 0739 05/21/22 0826 05/21/22 1747  BP:   127/68 (!) 114/54  Pulse:  77 80 74  Resp:  15 16 14   Temp:   98 F (36.7 C) 97.9 F (36.6 C)  TempSrc:   Oral Oral  SpO2:  97% 97% 93%  Weight: 86.4 kg     Height:        Intake/Output Summary (Last 24 hours) at 05/21/2022 2018 Last data filed at 05/21/2022 1500 Gross per 24 hour  Intake 361.75 ml  Output --  Net 361.75 ml   Filed Weights   05/20/22 1436 05/21/22 0704  Weight: 93.6 kg 86.4 kg   Examination: Physical Exam:  Constitutional: WN/WD overweight chronically ill-appearing Caucasian male who appears oriented x 2 Respiratory: Diminished to auscultation bilaterally, no wheezing, rales, rhonchi or crackles. Normal respiratory effort and patient is not tachypenic. No accessory muscle use.  Cardiovascular: Irregularly irregular tachycardic, no murmurs / rubs / gallops. S1 and S2 auscultated.  Trace extremity edema Abdomen: Soft, non-tender, slightly distended secondary to body habitus. Bowel sounds positive.  GU: Deferred. Musculoskeletal: No clubbing / cyanosis of digits/nails. No joint deformity upper and lower extremities. Skin: No rashes, lesions, ulcers limited skin evaluation.  No induration; Warm and dry.  Neurologic: CN 2-12 grossly intact with no focal deficits but was little confused. Romberg sign cerebellar reflexes not assessed.  Psychiatric: Impaired judgment and insight. Alert and oriented x 2. Normal mood and appropriate affect.   Data Reviewed: I have personally reviewed following labs and imaging studies  CBC: Recent Labs  Lab 05/20/22 1532 05/21/22 0113  WBC 9.1 9.8  HGB 12.8* 13.0  HCT 38.9* 38.8*  MCV 86.6 86.2  PLT 241 [id number]   Basic Metabolic Panel: Recent Labs  Lab 05/20/22 1532 05/21/22 0113  NA 141 137  K 3.7 3.6   CL 105 107  CO2 26 22  GLUCOSE 125* 112*  BUN 14 15  CREATININE 1.26* 1.17  CALCIUM 8.8* 8.5*  MG  --  1.8  PHOS  --  3.7   GFR: Estimated Creatinine Clearance: 61.5 mL/min (by C-G formula based on SCr of 1.17 mg/dL). Liver Function Tests: Recent Labs  Lab 05/20/22 1532 05/21/22 0113  AST 18 19  ALT 19 20  ALKPHOS 84 73  BILITOT 1.4* 1.3*  PROT 6.1* 5.8*  ALBUMIN 2.7* 2.7*   No results for input(s): "LIPASE", "AMYLASE" in the last 168 hours. No results for input(s): "AMMONIA" in the last 168 hours. Coagulation Profile: No results for input(s): "INR", "PROTIME" in the last 168 hours. Cardiac Enzymes: No results for input(s): "CKTOTAL", "CKMB", "CKMBINDEX", "TROPONINI" in the last 168 hours. BNP (last 3 results) No results for input(s): "PROBNP" in the last 8760 hours. HbA1C: No results for input(s): "HGBA1C" in the last 72 hours. CBG: Recent Labs  Lab 05/21/22 0628  GLUCAP 109*   Lipid Profile: No results for input(s): "CHOL", "HDL", "LDLCALC", "TRIG", "CHOLHDL", "LDLDIRECT" in the last 72 hours. Thyroid Function Tests: No results for input(s): "TSH", "T4TOTAL", "FREET4", "T3FREE", "THYROIDAB" in the last 72 hours. Anemia Panel: No results for input(s): "VITAMINB12", "FOLATE", "FERRITIN", "TIBC", "IRON", "RETICCTPCT" in the last 72 hours. Sepsis Labs: No results for input(s): "PROCALCITON", "LATICACIDVEN" in the last 168 hours.  No results found for this or any previous visit (from the past 240 hour(s)).   Radiology Studies: CT CHEST ABDOMEN PELVIS W CONTRAST  Result Date: 05/20/2022 CLINICAL DATA:  Polytrauma, blunt EXAM: CT CHEST, ABDOMEN, AND PELVIS WITH CONTRAST TECHNIQUE: Multidetector CT imaging of the chest, abdomen and pelvis was performed following the standard protocol during bolus administration of intravenous contrast. RADIATION DOSE REDUCTION: This exam was performed according to the departmental dose-optimization program which includes automated  exposure control, adjustment of the mA and/or kV according to patient size and/or use of iterative reconstruction technique. CONTRAST:  48mL ISOVUE-370 IOPAMIDOL (ISOVUE-370) INJECTION 76% COMPARISON:  PET CT 04/11/2022 FINDINGS: CHEST: Cardiovascular: No aortic injury. The thoracic aorta is normal in caliber. The heart is normal in size. No significant pericardial effusion. Coronary artery calcification with left anterior descending coronary artery stent. Mediastinum/Nodes: No pneumomediastinum. No mediastinal hematoma. The esophagus is unremarkable. The thyroid is unremarkable. The central airways are patent. No mediastinal, hilar, or axillary lymphadenopathy. Lungs/Pleura: Bilateral upper lobe thin walled subcentimeter cystic lesions. Chronic 3 mm metallic density within the right lower lobe (3:44). No focal consolidation. No pulmonary nodule. No pulmonary mass. No pulmonary contusion or laceration. No pneumatocele formation. Trace right pleural effusion.  No pneumothorax. No hemothorax. Musculoskeletal/Chest wall: No chest wall mass. Acute minimally displaced right posterior eleventh and twelfth ribs fractured in 2 different places. Acute minimally displaced right posterolateral ninth and eighth ribs. No spinal fracture. Surgical hardware of the left humerus.  ABDOMEN / PELVIS: Hepatobiliary: Not enlarged. No focal lesion. No laceration or subcapsular hematoma. Status post cholecystectomy.  No biliary ductal dilatation. Pancreas: Normal pancreatic contour. No main pancreatic duct dilatation. Spleen: Not enlarged. No focal lesion. No laceration, subcapsular hematoma, or vascular injury. Adrenals/Urinary Tract: No nodularity bilaterally. The right kidney is homogeneously enhancing. Status post left nephrectomy. No hydronephrosis. No contusion, laceration, or subcapsular hematoma. No injury to the vascular structures or collecting systems. No hydroureter. The urinary bladder is unremarkable. Stomach/Bowel: No small  or large bowel wall thickening or dilatation. Colonic diverticulosis. The appendix is unremarkable. Vasculature/Lymphatics: Severe atherosclerotic plaque. No abdominal aorta or iliac aneurysm. No active contrast extravasation or pseudoaneurysm. No abdominal, pelvic, inguinal lymphadenopathy. Reproductive: Normal. Other: No simple free fluid ascites. No pneumoperitoneum. No hemoperitoneum. No mesenteric hematoma identified. No organized fluid collection. Musculoskeletal: Subcutaneus soft tissue edema and hematoma of the right flank. No acute pelvic fracture.  Chronic stable L1 compression fracture. Ports and Devices: None. IMPRESSION: 1. Acute minimally displaced right posterior 11th and 12th rib fractures in 2 different places. Acute minimally displaced right posterolateral 8th and 9th ribs. Associated subcutaneus soft tissue edema and hematoma of the right flank. 2. Trace right pleural effusion. 3. No acute intra-abdominal or intrapelvic traumatic injury. 4. No acute fracture or traumatic malalignment of the thoracic or lumbar spine. 5. Other imaging findings of potential clinical significance: Status post left nephrectomy. Aortic Atherosclerosis (ICD10-I70.0). Electronically Signed   By: Iven Finn M.D.   On: 05/20/2022 21:08   CT HEAD WO CONTRAST (5MM)  Result Date: 05/20/2022 CLINICAL DATA:  Head trauma, minor (Age >= 65y) EXAM: CT HEAD WITHOUT CONTRAST TECHNIQUE: Contiguous axial images were obtained from the base of the skull through the vertex without intravenous contrast. RADIATION DOSE REDUCTION: This exam was performed according to the departmental dose-optimization program which includes automated exposure control, adjustment of the mA and/or kV according to patient size and/or use of iterative reconstruction technique. COMPARISON:  MRI head 05/07/2022, CT head 05/06/2022 FINDINGS: Brain: Patchy and confluent areas of decreased attenuation are noted throughout the deep and periventricular white  matter of the cerebral hemispheres bilaterally, compatible with chronic microvascular ischemic disease. No evidence of large-territorial acute infarction. No parenchymal hemorrhage. No mass lesion. No extra-axial collection. No mass effect or midline shift. No hydrocephalus. Basilar cisterns are patent. Vascular: No hyperdense vessel. Atherosclerotic calcifications are present within the cavernous internal carotid arteries. Skull: No acute fracture or focal lesion. Sinuses/Orbits: Paranasal sinuses and mastoid air cells are clear. The orbits are unremarkable. Other: None. IMPRESSION: No acute intracranial abnormality. Electronically Signed   By: Iven Finn M.D.   On: 05/20/2022 19:41   DG Chest Portable 1 View  Result Date: 05/20/2022 CLINICAL DATA:  Fall. EXAM: PORTABLE CHEST 1 VIEW COMPARISON:  May 06, 2022. FINDINGS: The heart size and mediastinal contours are within normal limits. Both lungs are clear. The visualized skeletal structures are unremarkable. IMPRESSION: No active disease. Electronically Signed   By: Marijo Conception M.D.   On: 05/20/2022 16:17    Scheduled Meds:  apixaban  5 mg Oral BID   buPROPion  300 mg Oral Daily   clopidogrel  75 mg Oral Daily   empagliflozin  10 mg Oral Daily   metoprolol tartrate  50 mg Oral BID   pantoprazole  40 mg Oral Daily   PARoxetine  30 mg Oral Daily   [START ON 05/22/2022] pregabalin  150 mg Oral TID   QUEtiapine  25 mg Oral QHS  rosuvastatin  10 mg Oral Daily   sacubitril-valsartan  1 tablet Oral BID   Continuous Infusions:   LOS: 0 days   Raiford Noble, DO [facility] Available via Epic secure chat 7am-7pm After these hours, please refer to coverage provider listed on [email or web address] 05/21/2022, 8:18 PM

## 2022-05-21 NOTE — Telephone Encounter (Signed)
Team unfortunately able to reach pt after he left AMA. It appears our team is now seeing him in the hospital once more.

## 2022-05-21 NOTE — Progress Notes (Signed)
LTM EEG hooked up and running - no initial skin breakdown - push button tested - [facility] monitoring.  

## 2022-05-21 NOTE — Consult Note (Addendum)
Cardiology Consultation   Patient ID: Brendan Butler MRN: [id number]; DOB: [birth year]  Admit date: 05/20/2022 Date of Consult: 05/21/2022  PCP:  Wenda Low, [place] [facility] Cardiologist:  [name], MD   {    Patient Profile:   Brendan Butler is a 72 y.o. male with a hx of CAD s/p stent multiple interventions to LAD, recent admission on 05/06/2022 for atrial flutter s/p cardioversion, CKD III, seizures, chronic HFrEF, pulmonary nodules, HTN, HLD  who is being seen 05/21/2022 for the evaluation of recurrent atrial flutter at the request of Dr. Alfredia Ferguson.  History of Present Illness:   Brendan Butler has significant cardiac history that includes CAD with multiple PCI to the LAD dating back to 2001. Then in November 2014 he presented with an anterior ST segment elevation MI and underwent insertion of LAD DES stents. He was discharged on 01/11/2013 was readmitted with another STEMI and taken back to the cath lab. On the cath lab table he developed ventricular fibrillation 3 times, each time with rapid defibrillation back to sinus rhythm. He was found to have acute in-stent thrombosis of the recently placed LAD stent. The LAD was totally occluded in the proximal vessel at the proximal edge of the stented segment. The lesion was predilated with a 2.5 mm balloon followed by a 3.0 mm noncompliant balloon. He was then discharged on Brillinta to effient. He was discharged on effient at that time due to concern that he failed brillinta; however, this may have been due to poor medication adherence rather than true Brillinta failure. Currently on plavix, but was on effient for years, but brain MRI found previous infarct which is a clear contraindication to effient. Last PCI was an angioplasty for in stent restenosis of LAD in 2015.   Recently on 05/06/2022 patient was admitted due to altered mental status and incidentally found to be in atrial flutter.  He underwent DCCV with return  to NSR. TEE showed an LVEF of 20 to 25% with global hypokinesis, mildly dilated right atrium, and moderate grade 3 layered plaque involving the descending aorta.  His reduced EF was thought to be related to the atrial flutter rather than decompensated heart failure.  He left AMA before we could fully optimize his GDMT. He was discharged on  Entresto 24/16 milligrams twice daily for optimization of GDMT with plan of adding SGLT2 and spironolactone outpatient.   Now, patient has been readmitted on 05/20/2022 for worsening altered mental status,  generalized weakness, and recurrent falls as well as suspected noncompliance with medication.  Patient was evaluated at the bedside and was not oriented to year or day.  After speaking with his daughter Brendan Butler it seems that patient has had a steady decline in mental status since his last admission and is not actively taking care of himself.  He's forgetting a lot of things and is confusing events. Additionally, much of what he says conflicts with what she reports.  She also states that she that the cousin is encouraging him not to seek medical treatment and may be the reason why he left AMA last time.  At this time patient only complains of lightheadedness and balance issues.  He denies any symptoms of palpitations, heart racing, chest pain, shortness of breath, leg swelling, cough.  Patient states that he has sustained a fall however it was not preceded by a prodrome and he did not lose consciousness.  There is no evidence of a postictal state.  Patient  believes this was just due to balance issues.  ED workup: EKG showing atrial flutter with a heart rate of 130 with neg trops. .  Negative chest x-ray, negative CT head, negative UA,  CT chest and abdomen and pelvis showing multiple rib fractures.  He was initially started on a diltiazem drip however has been switched over to amiodarone with a loading dose due to reduced EF.  Past Medical History:  Diagnosis Date   Acute  myocardial infarction of other anterior wall, initial episode of care 12/29/2012   Promus stent to LAD   Acute ST segment elevation MI ([place]) 01/05/2013   secondary to thrombus in stent; Brilintta changed to Effient pt with ASA allergy   Coronary artery disease 2001   stent to LAD and patent 2007 on cath   Echocardiogram abnormal 01/06/2013   EF [id number], grade I diastolic dysfunction   Erectile dysfunction    Hyperlipidemia LDL goal <70    Hypertension    OSA (obstructive sleep apnea)    No CPAP use overnight    Past Surgical History:  Procedure Laterality Date   BACK SURGERY  2007   Decompression lumbar laminectomy and micro discectomy, L4-5   BRONCHIAL BIOPSY  12/28/2021   Procedure: BRONCHIAL BIOPSIES;  Surgeon: Collene Gobble, MD;  Location: [place];  Service: Pulmonary;;   BRONCHIAL BRUSHINGS  12/28/2021   Procedure: BRONCHIAL BRUSHINGS;  Surgeon: Collene Gobble, MD;  Location: [place];  Service: Pulmonary;;   BRONCHIAL NEEDLE ASPIRATION BIOPSY  12/28/2021   Procedure: BRONCHIAL NEEDLE ASPIRATION BIOPSIES;  Surgeon: Collene Gobble, MD;  Location: [facility] ENDOSCOPY;  Service: Pulmonary;;   BRONCHIAL WASHINGS  12/28/2021   Procedure: BRONCHIAL WASHINGS;  Surgeon: Collene Gobble, MD;  Location: [place] ENDOSCOPY;  Service: Pulmonary;;   CARDIAC CATHETERIZATION  2007   patent stent to LAD and normal [place]   CARDIOVERSION N/A 05/10/2022   Procedure: CARDIOVERSION;  Surgeon: Geralynn Rile, MD;  Location: [place];  Service: Cardiovascular;  Laterality: N/A;   CHOLECYSTECTOMY N/A 12/26/2021   Procedure: LAPAROSCOPIC CHOLECYSTECTOMY, LAPAROSCOPIC LYSIS OF ADHESIONS GREATER THAN 1 HOUR;  Surgeon: [name], MD;  Location: [place];  Service: General;  Laterality: N/A;   CORONARY ANGIOPLASTY WITH STENT PLACEMENT  2001   to LAD   CORONARY ANGIOPLASTY WITH STENT PLACEMENT  12/29/12   STEMI with promus DES to LAD   CORONARY ANGIOPLASTY WITH STENT PLACEMENT  01/05/13   STEMI  with thrombosis in stent to LAD   FIDUCIAL MARKER PLACEMENT  12/28/2021   Procedure: FIDUCIAL MARKER PLACEMENT;  Surgeon: Collene Gobble, MD;  Location: [facility] ENDOSCOPY;  Service: Pulmonary;;   LEFT HEART CATH N/A 12/29/2012   Procedure: LEFT HEART CATH;  Surgeon: Jettie Booze, MD;  Location: [facility] CATH LAB;  Service: Cardiovascular;  Laterality: N/A;   LEFT HEART CATHETERIZATION WITH CORONARY ANGIOGRAM N/A 01/05/2013   Procedure: LEFT HEART CATHETERIZATION WITH CORONARY ANGIOGRAM;  Surgeon: Blane Ohara, MD;  Location: [facility] CATH LAB;  Service: Cardiovascular;  Laterality: N/A;   LEFT HEART CATHETERIZATION WITH CORONARY ANGIOGRAM N/A 04/22/2013   Procedure: LEFT HEART CATHETERIZATION WITH CORONARY ANGIOGRAM;  Surgeon: Troy Sine, MD;  Location: [facility] C[facility] LAB;  Service: Cardiovascular;  Laterality: N/A;   PARTIAL NEPHRECTOMY Left 1975   PATIENT ONLY HAS ONE KIDNEY   PERCUTANEOUS CORONARY STENT INTERVENTION (PCI-S)  12/29/2012   Procedure: PERCUTANEOUS CORONARY STENT INTERVENTION (PCI-S);  Surgeon: Jettie Booze, MD;  Location: [facility] CATH LAB;  Service: Cardiovascular;;  TEE WITHOUT CARDIOVERSION N/A 05/10/2022   Procedure: TRANSESOPHAGEAL ECHOCARDIOGRAM (TEE);  Surgeon: Geralynn Rile, MD;  Location: [place];  Service: Cardiovascular;  Laterality: N/A;    Inpatient Medications: Scheduled Meds:  apixaban  5 mg Oral BID   buPROPion  300 mg Oral Daily   clopidogrel  75 mg Oral Daily   metoprolol tartrate  25 mg Oral BID   pantoprazole  40 mg Oral Daily   PARoxetine  30 mg Oral Daily   pregabalin  450 mg Oral Daily   QUEtiapine  25 mg Oral QHS   rosuvastatin  10 mg Oral Daily   sacubitril-valsartan  1 tablet Oral BID   Continuous Infusions:  amiodarone 60 mg/hr (05/21/22 1205)   Followed by   amiodarone     PRN Meds: acetaminophen, ondansetron (ZOFRAN) IV  Allergies:    Allergies  Allergen Reactions   Levitra [Vardenafil] Other (See Comments)    Blurred vision    Aspirin Hives   Lipitor [Atorvastatin] Other (See Comments)    Muscle cramps; patient states he does not take medication at home    Social History:   Social History   Socioeconomic History   Marital status: Divorced    Spouse name: Not on file   Number of children: Not on file   Years of education: Not on file   Highest education level: Not on file  Occupational History   Not on file  Tobacco Use   Smoking status: Former    Packs/day: 2.00    Years: 25.00    Additional pack years: 0.00    Total pack years: 50.00    Types: Cigarettes    Quit date: 2001    Years since quitting: 23.2   Smokeless tobacco: Current    Types: Chew  Vaping Use   Vaping Use: Never used  Substance and Sexual Activity   Alcohol use: Not Currently    Comment: 2001 stopped drinking   Drug use: No   Sexual activity: Not on file  Other Topics Concern   Not on file  Social History Narrative   Not on file   Social Determinants of Health   Financial Resource Strain: Not on file  Food Insecurity: No Food Insecurity (05/21/2022)   Hunger Vital Sign    Worried About Running Out of Food in the Last Year: Never true    Ran Out of Food in the Last Year: Never true  Transportation Needs: No Transportation Needs (05/21/2022)   PRAPARE - Hydrologist (Medical): No    Lack of Transportation (Non-Medical): No  Physical Activity: Not on file  Stress: Not on file  Social Connections: Not on file  Intimate Partner Violence: Not At Risk (05/21/2022)   Humiliation, Afraid, Rape, and Kick questionnaire    Fear of Current or Ex-Partner: No    Emotionally Abused: No    Physically Abused: No    Sexually Abused: No    Family History:   Family History  Problem Relation Age of Onset   Alzheimer's disease Mother    CAD Father      ROS:  Please see the history of present illness.  All other ROS reviewed and negative.     Physical Exam/Data:   Vitals:   05/21/22 0411 05/21/22  0704 05/21/22 0739 05/21/22 0826  BP: 115/64   127/68  Pulse: 75  77 80  Resp: 16  15 16   Temp: 98 F (36.7 C)   98 F (36.7  C)  TempSrc: Oral   Oral  SpO2: 94%  97% 97%  Weight:  86.4 kg    Height:        Intake/Output Summary (Last 24 hours) at 05/21/2022 1358 Last data filed at 05/21/2022 0453 Gross per 24 hour  Intake 121.02 ml  Output --  Net 121.02 ml      05/21/2022    7:04 AM 05/20/2022    2:36 PM 05/08/2022    1:54 PM  Last 3 Weights  Weight (lbs) 190 lb 8 oz 206 lb 5.6 oz 206 lb 5.6 oz  Weight (kg) 86.41 kg 93.6 kg 93.6 kg     Body mass index is 27.33 kg/m.  General: Obvious confusion. not oriented to day, month, or year  HEENT: normal Neck: no JVD Vascular: No carotid bruits; Distal pulses 2+ bilaterally Cardiac: Variable heart rate Lungs:  clear to auscultation bilaterally, no wheezing, rhonchi or rales  Abd: soft, nontender, no hepatomegaly  Ext: no edema Musculoskeletal:  No deformities, BUE and BLE strength normal and equal Skin: warm and dry  Neuro:  CNs 2-12 intact, no focal abnormalities noted Psych:  Normal affect   EKG:  The EKG was personally reviewed and demonstrates: Atrial flutter with a heart rate of 130 Telemetry:  Telemetry was personally reviewed and demonstrates:  Atrial flutter with a heart rate of 90  Relevant CV Studies:  TEE 05/10/2022 Left Ventricle: Left ventricular ejection fraction, by estimation, is 20  to 25%. The left ventricle has severely decreased function. The left  ventricular internal cavity size was normal in size.   Right Ventricle: The right ventricular size is normal. No increase in  right ventricular wall thickness. Right ventricular systolic function is  mildly reduced.   Left Atrium: Left atrial size was moderately dilated. No left atrial/left  atrial appendage thrombus was detected. The LAA emptying velocity was 39  cm/s.   Right Atrium: Right atrial size was mildly dilated.   Pericardium: There is no  evidence of pericardial effusion.   Mitral Valve: The mitral valve is grossly normal. Mild mitral valve  regurgitation. No evidence of mitral valve stenosis.   Tricuspid Valve: The tricuspid valve is grossly normal. Tricuspid valve  regurgitation is trivial. No evidence of tricuspid stenosis.   Aortic Valve: The aortic valve is tricuspid. Aortic valve regurgitation is  not visualized. No aortic stenosis is present.   Pulmonic Valve: The pulmonic valve was grossly normal. Pulmonic valve  regurgitation is trivial. No evidence of pulmonic stenosis.   Aorta: The aortic root and ascending aorta are structurally normal, with  no evidence of dilitation. There is moderate (Grade III) layered plaque  involving the descending aorta.   Venous: The left upper pulmonary vein, left lower pulmonary vein, right  lower pulmonary vein and right upper pulmonary vein are normal.   IAS/Shunts: No atrial level shunt detected by color flow Doppler.  Laboratory Data:  High Sensitivity Troponin:   Recent Labs  Lab 05/06/22 1850 05/06/22 2110 05/20/22 1532 05/20/22 1820  TROPONINIHS 28* 28* 14 12     Chemistry Recent Labs  Lab 05/20/22 1532 05/21/22 0113  NA 141 137  K 3.7 3.6  CL 105 107  CO2 26 22  GLUCOSE 125* 112*  BUN 14 15  CREATININE 1.26* 1.17  CALCIUM 8.8* 8.5*  MG  --  1.8  GFRNONAA >60 >60  ANIONGAP 10 8    Recent Labs  Lab 05/20/22 1532 05/21/22 0113  PROT 6.1* 5.8*  ALBUMIN 2.7*  2.7*  AST 18 19  ALT 19 20  ALKPHOS 84 73  BILITOT 1.4* 1.3*   Lipids No results for input(s): "CHOL", "TRIG", "HDL", "LABVLDL", "LDLCALC", "CHOLHDL" in the last 168 hours.  Hematology Recent Labs  Lab 05/20/22 1532 05/21/22 0113  WBC 9.1 9.8  RBC 4.49 4.50  HGB 12.8* 13.0  HCT 38.9* 38.8*  MCV 86.6 86.2  MCH 28.5 28.9  MCHC 32.9 33.5  RDW 13.0 13.0  PLT 241 242   Thyroid No results for input(s): "TSH", "FREET4" in the last 168 hours.  BNPNo results for input(s): "BNP",  "PROBNP" in the last 168 hours.  DDimer No results for input(s): "DDIMER" in the last 168 hours.   Radiology/Studies:  CT CHEST ABDOMEN PELVIS W CONTRAST  Result Date: 05/20/2022 CLINICAL DATA:  Polytrauma, blunt EXAM: CT CHEST, ABDOMEN, AND PELVIS WITH CONTRAST TECHNIQUE: Multidetector CT imaging of the chest, abdomen and pelvis was performed following the standard protocol during bolus administration of intravenous contrast. RADIATION DOSE REDUCTION: This exam was performed according to the departmental dose-optimization program which includes automated exposure control, adjustment of the mA and/or kV according to patient size and/or use of iterative reconstruction technique. CONTRAST:  53mL ISOVUE-370 IOPAMIDOL (ISOVUE-370) INJECTION 76% COMPARISON:  PET CT 04/11/2022 FINDINGS: CHEST: Cardiovascular: No aortic injury. The thoracic aorta is normal in caliber. The heart is normal in size. No significant pericardial effusion. Coronary artery calcification with left anterior descending coronary artery stent. Mediastinum/Nodes: No pneumomediastinum. No mediastinal hematoma. The esophagus is unremarkable. The thyroid is unremarkable. The central airways are patent. No mediastinal, hilar, or axillary lymphadenopathy. Lungs/Pleura: Bilateral upper lobe thin walled subcentimeter cystic lesions. Chronic 3 mm metallic density within the right lower lobe (3:44). No focal consolidation. No pulmonary nodule. No pulmonary mass. No pulmonary contusion or laceration. No pneumatocele formation. Trace right pleural effusion.  No pneumothorax. No hemothorax. Musculoskeletal/Chest wall: No chest wall mass. Acute minimally displaced right posterior eleventh and twelfth ribs fractured in 2 different places. Acute minimally displaced right posterolateral ninth and eighth ribs. No spinal fracture. Surgical hardware of the left humerus. ABDOMEN / PELVIS: Hepatobiliary: Not enlarged. No focal lesion. No laceration or subcapsular  hematoma. Status post cholecystectomy.  No biliary ductal dilatation. Pancreas: Normal pancreatic contour. No main pancreatic duct dilatation. Spleen: Not enlarged. No focal lesion. No laceration, subcapsular hematoma, or vascular injury. Adrenals/Urinary Tract: No nodularity bilaterally. The right kidney is homogeneously enhancing. Status post left nephrectomy. No hydronephrosis. No contusion, laceration, or subcapsular hematoma. No injury to the vascular structures or collecting systems. No hydroureter. The urinary bladder is unremarkable. Stomach/Bowel: No small or large bowel wall thickening or dilatation. Colonic diverticulosis. The appendix is unremarkable. Vasculature/Lymphatics: Severe atherosclerotic plaque. No abdominal aorta or iliac aneurysm. No active contrast extravasation or pseudoaneurysm. No abdominal, pelvic, inguinal lymphadenopathy. Reproductive: Normal. Other: No simple free fluid ascites. No pneumoperitoneum. No hemoperitoneum. No mesenteric hematoma identified. No organized fluid collection. Musculoskeletal: Subcutaneus soft tissue edema and hematoma of the right flank. No acute pelvic fracture.  Chronic stable L1 compression fracture. Ports and Devices: None. IMPRESSION: 1. Acute minimally displaced right posterior 11th and 12th rib fractures in 2 different places. Acute minimally displaced right posterolateral 8th and 9th ribs. Associated subcutaneus soft tissue edema and hematoma of the right flank. 2. Trace right pleural effusion. 3. No acute intra-abdominal or intrapelvic traumatic injury. 4. No acute fracture or traumatic malalignment of the thoracic or lumbar spine. 5. Other imaging findings of potential clinical significance: Status post left nephrectomy. Aortic Atherosclerosis (  ICD10-I70.0). Electronically Signed   By: Iven Finn M.D.   On: 05/20/2022 21:08   CT HEAD WO CONTRAST (5MM)  Result Date: 05/20/2022 CLINICAL DATA:  Head trauma, minor (Age >= 65y) EXAM: CT HEAD  WITHOUT CONTRAST TECHNIQUE: Contiguous axial images were obtained from the base of the skull through the vertex without intravenous contrast. RADIATION DOSE REDUCTION: This exam was performed according to the departmental dose-optimization program which includes automated exposure control, adjustment of the mA and/or kV according to patient size and/or use of iterative reconstruction technique. COMPARISON:  MRI head 05/07/2022, CT head 05/06/2022 FINDINGS: Brain: Patchy and confluent areas of decreased attenuation are noted throughout the deep and periventricular white matter of the cerebral hemispheres bilaterally, compatible with chronic microvascular ischemic disease. No evidence of large-territorial acute infarction. No parenchymal hemorrhage. No mass lesion. No extra-axial collection. No mass effect or midline shift. No hydrocephalus. Basilar cisterns are patent. Vascular: No hyperdense vessel. Atherosclerotic calcifications are present within the cavernous internal carotid arteries. Skull: No acute fracture or focal lesion. Sinuses/Orbits: Paranasal sinuses and mastoid air cells are clear. The orbits are unremarkable. Other: None. IMPRESSION: No acute intracranial abnormality. Electronically Signed   By: Iven Finn M.D.   On: 05/20/2022 19:41   DG Chest Portable 1 View  Result Date: 05/20/2022 CLINICAL DATA:  Fall. EXAM: PORTABLE CHEST 1 VIEW COMPARISON:  May 06, 2022. FINDINGS: The heart size and mediastinal contours are within normal limits. Both lungs are clear. The visualized skeletal structures are unremarkable. IMPRESSION: No active disease. Electronically Signed   By: Marijo Conception M.D.   On: 05/20/2022 16:17     Assessment and Plan:   Recurrent atrial flutter status post TEE/DCCV Patient with recent admission for atrial flutter who underwent DCCV on 05/10/2022 with successful conversion to NSR, now presenting with increased altered mental status, lightheadedness and fall with multiple  rib fractures.  Patient was initially started on diltiazem in the ED and tachycardic however now has been transitioned to amiodarone with a loading bolus.  Heart rate appears to be variable however currently is less than 100.  Patient has history of poor medication adherence. Patient complaining of being lightheaded, but denying any palpitations or shortness of breath. Stopping amio infusion due to controlled rates and likely non compliance with eliquis. Can add back on if hemodynamically unstable with uncontrolled rates, but would like to see how HR tolerates without.  Increasing Lopressor 25 mg twice daily to 50mg  BID Continue Eliquis 5 mg BID. Unclear if he has been compliant with this, however considering AMS and prior non adherence would suspect he has not been taking Given significant decline in mentation, failure to thrive, noncompliance, and recent falls there is a significant concern whether or not chronic anticoagulation is appropriate for him.  However, I have discussed with both daughters and patient about possible discharge to an assisted living facility and they seem amenable. If he were to be d/c to an assisted living facility it would be safer for him to be placed on chronic anticoagulation. However, will need to discuss the risks and benefits of this with family/patient.  Given concerns for anticoagulation as stated above this complicates the management of his atrial flutter as atrial flutter is difficult to rate control. Would not recommend repeat cardioversion unless he can be compliant with anticoagulation. Also, do not think he would be a candidate for ablation due to other comorbidities.  Fall with multiple rib fractures There is no evidence of seizure-like activity or loss  of consciousness.  It is likely this could be attributed to feeling lightheaded due to the atrial flutter.   HFrEF HTN Patient has had a significant decrease in EF when comparing echo from 2023. TEE during recent  admission in the setting of atrial flutter showed an LVEF of 20 to 25% with global hypokinesia. Right atrium was mildly dilated. There was moderate grade 3 aortic plaque involving the descending; EF down from 55-60% in October 2023.  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 Patient started to undergo titration of GDMT; however there is significant concern for medication adherence likely due to altered mental status and home environment. Increase to Lopressor 50 mg twice daily. Will keep lopressor for now due to prioritization of rate control, however the ultimate goal is to switch to toprol XL prior to d/c due to reduced EF.  Continue Entresto 24-26 milligrams twice daily. Adding Jardiance 10mg  daily. Will defer starting spironolactone for now to allow for more rate control if needed, but hopefully start prior to d/c. May start tomorrow.  Euvolemic on exam no evidence of pulmonary congestion. Has minor complaints of chronic right leg swelling but has been well-controlled on home PO Lasix 20 mg. Can continue home lasix.  Repeat echocardiogram in 2 to 3 months after restoration of NSR to reassess LV function.   Altered mental status, failure to thrive, noncompliance Patient continues to have progressive decline in cognitive function without apparent etiology.  There is major concern for failure to thrive considering he is not eating, taking his medications, and now falling at home.  He has 2 daughters that are unable to actively take care of him.  Additionally daughters have major concerns about cousin advising him against medical care. Discussion of palliative care has been discussed with both daughters and are agreeable to plan.  All imaging results have been negative, negative urinalysis, no apparent infectious etiology. Recent admission was followed by psychiatry due to agitation/confusion Will refer for social work to get involved for possible placement for assisted living facility and  help in determining patients capacity to make medical decisions and to establish POA if needed.  Given rapid new onset of altered mental status and unsteady gait without known etiology would recommend for neurologic evaluation.  Coronary artery disease s/p multiple PCIs to LAD 2014 Appears stable at this time with no current complaints of chest pain. Continue Plavix 75 mg Documented aspirin allergy   HLD Continue rosuvastatin 10 mg  CKD  Creatinine and GFR within normal limits. This is a significant improvement from previous admission lab values.  Monitor renal function closely  Pulmonary nodules  Defer to primary      Risk Assessment/Risk Scores:   New York Heart Association (NYHA) Functional Class NYHA Class II  CHA2DS2-VASc Score = 3  This indicates a 3.2% annual risk of stroke. The patient's score is based upon: CHF History: 1 HTN History: 1 Diabetes History: 0 Stroke History: 0 Vascular Disease History: 0 Age Score: 1 Gender Score: 0   For questions or updates, please contact [place] Please consult [email or web address] for contact info under    Signed, Bonnee Quin, PA-C  05/21/2022 1:58 PM

## 2022-05-21 NOTE — Progress Notes (Signed)
EEG complete - results pending 

## 2022-05-22 ENCOUNTER — Other Ambulatory Visit (HOSPITAL_COMMUNITY): Payer: Self-pay

## 2022-05-22 DIAGNOSIS — W19XXXA Unspecified fall, initial encounter: Secondary | ICD-10-CM

## 2022-05-22 DIAGNOSIS — G9340 Encephalopathy, unspecified: Secondary | ICD-10-CM

## 2022-05-22 DIAGNOSIS — I4891 Unspecified atrial fibrillation: Secondary | ICD-10-CM | POA: Diagnosis not present

## 2022-05-22 DIAGNOSIS — R4182 Altered mental status, unspecified: Secondary | ICD-10-CM

## 2022-05-22 LAB — COMPREHENSIVE METABOLIC PANEL
ALT: 20 U/L (ref 0–44)
AST: 17 U/L (ref 15–41)
Albumin: 2.7 g/dL — ABNORMAL LOW (ref 3.5–5.0)
Alkaline Phosphatase: 83 U/L (ref 38–126)
Anion gap: 10 (ref 5–15)
BUN: 21 mg/dL (ref 8–23)
CO2: 25 mmol/L (ref 22–32)
Calcium: 8.5 mg/dL — ABNORMAL LOW (ref 8.9–10.3)
Chloride: 104 mmol/L (ref 98–111)
Creatinine, Ser: 1.39 mg/dL — ABNORMAL HIGH (ref 0.61–1.24)
GFR, Estimated: 55 mL/min — ABNORMAL LOW (ref >60)
Glucose, Bld: 93 mg/dL (ref 70–99)
Potassium: 3.7 mmol/L (ref 3.5–5.1)
Sodium: 139 mmol/L (ref 135–145)
Total Bilirubin: 1.2 mg/dL (ref 0.3–1.2)
Total Protein: 5.6 g/dL — ABNORMAL LOW (ref 6.5–8.1)

## 2022-05-22 LAB — CBC WITH DIFFERENTIAL/PLATELET
Abs Immature Granulocytes: 0.04 10*3/uL (ref 0.00–0.07)
Basophils Absolute: 0 10*3/uL (ref 0.0–0.1)
Basophils Relative: 0 %
Eosinophils Absolute: 0.1 10*3/uL (ref 0.0–0.5)
Eosinophils Relative: 1 %
HCT: 36.4 % — ABNORMAL LOW (ref 39.0–52.0)
Hemoglobin: 12.4 g/dL — ABNORMAL LOW (ref 13.0–17.0)
Immature Granulocytes: 0 %
Lymphocytes Relative: 33 %
Lymphs Abs: 4 10*3/uL (ref 0.7–4.0)
MCH: 29 pg (ref 26.0–34.0)
MCHC: 34.1 g/dL (ref 30.0–36.0)
MCV: 85.2 fL (ref 80.0–100.0)
Monocytes Absolute: 1 10*3/uL (ref 0.1–1.0)
Monocytes Relative: 9 %
Neutro Abs: 6.8 10*3/uL (ref 1.7–7.7)
Neutrophils Relative %: 57 %
Platelets: 244 10*3/uL (ref 150–400)
RBC: 4.27 MIL/uL (ref 4.22–5.81)
RDW: 13.2 % (ref 11.5–15.5)
WBC: 12 10*3/uL — ABNORMAL HIGH (ref 4.0–10.5)
nRBC: 0 % (ref 0.0–0.2)

## 2022-05-22 LAB — PHOSPHORUS: Phosphorus: 3.4 mg/dL (ref 2.5–4.6)

## 2022-05-22 LAB — MAGNESIUM: Magnesium: 2.2 mg/dL (ref 1.7–2.4)

## 2022-05-22 MED ORDER — METOPROLOL SUCCINATE ER 100 MG PO TB24
100.0000 mg | ORAL_TABLET | Freq: Every day | ORAL | Status: DC
  Administered 2022-05-23: 100 mg via ORAL
  Filled 2022-05-22: qty 1

## 2022-05-22 MED ORDER — RIVAROXABAN 20 MG PO TABS
20.0000 mg | ORAL_TABLET | Freq: Every day | ORAL | Status: DC
  Administered 2022-05-22 – 2022-05-28 (×7): 20 mg via ORAL
  Filled 2022-05-22 (×7): qty 1

## 2022-05-22 NOTE — Discharge Instructions (Signed)
   _________________________________________________________________________________________________________________________________________ Information on my medicine - XARELTO (Rivaroxaban)  This medication education was reviewed with me or my healthcare representative as part of my discharge preparation.  The pharmacist that spoke with me during my hospital stay was:  Kaleen Mask, [facility]  Why was Xarelto prescribed for you? Xarelto was prescribed for you to reduce the risk of a blood clot forming that can cause a stroke if you have a medical condition called atrial fibrillation (a type of irregular heartbeat).  What do you need to know about xarelto ? Take your Xarelto ONCE DAILY at the same time every day with your evening meal. If you have difficulty swallowing the tablet whole, you may crush it and mix in applesauce just prior to taking your dose.  Take Xarelto exactly as prescribed by your doctor and DO NOT stop taking Xarelto without talking to the doctor who prescribed the medication.  Stopping without other stroke prevention medication to take the place of Xarelto may increase your risk of developing a clot that causes a stroke.  Refill your prescription before you run out.  After discharge, you should have regular check-up appointments with your healthcare provider that is prescribing your Xarelto.  In the future your dose may need to be changed if your kidney function or weight changes by a significant amount.  What do you do if you miss a dose? If you are taking Xarelto ONCE DAILY and you miss a dose, take it as soon as you remember on the same day then continue your regularly scheduled once daily regimen the next day. Do not take two doses of Xarelto at the same time or on the same day.   Important Safety Information A possible side effect of Xarelto is bleeding. You should call your healthcare provider right away if you experience any of the following: Bleeding from  an injury or your nose that does not stop. Unusual colored urine (red or dark brown) or unusual colored stools (red or black). Unusual bruising for unknown reasons. A serious fall or if you hit your head (even if there is no bleeding).  Some medicines may interact with Xarelto and might increase your risk of bleeding while on Xarelto. To help avoid this, consult your healthcare provider or pharmacist prior to using any new prescription or non-prescription medications, including herbals, vitamins, non-steroidal anti-inflammatory drugs (NSAIDs) and supplements.  This website has more information on Xarelto: [email or web address]

## 2022-05-22 NOTE — Progress Notes (Signed)
Physical Therapy Treatment Patient Details Name: Brendan Butler MRN: [id number] DOB: [birth year] Today's Date: 05/22/2022   History of Present Illness Pt is a 72 y.o. male who presented from home to the ED with AMS and falls. CT revealed multi R rib fxs. He had a recent hospitalization 3/10-3/14 with similar presentation and diagnosed with a fib with RVR. PMH: HTN, hyperlipidemia, CAD, CHF, obstructive sleep apnea    PT Comments    Pt in bed undergoing LTM EEG. RN disconnected EEG for hallway amb. Pt required supervision bed mobility, min guard assist transfers, and min guard assist amb 300' with RW. Pt more lethargic with flat affect today, as compared to being energetic and animated yesterday. RW used for amb due to lethargy. Pt returned to bed at end of session and reconnected to EEG monitoring. Yesterday, pt without c/o pain. Today pt with multiple references to R flank pain, rating as 6/10.    Recommendations for follow up therapy are one component of a multi-disciplinary discharge planning process, led by the attending physician.  Recommendations may be updated based on patient status, additional functional criteria and insurance authorization.  Follow Up Recommendations  Can patient physically be transported by private vehicle: Yes    Assistance Recommended at Discharge Frequent or constant Supervision/Assistance  Patient can return home with the following Assist for transportation;Assistance with cooking/housework;Direct supervision/assist for medications management;Help with stairs or ramp for entrance;A little help with walking and/or transfers;A little help with bathing/dressing/bathroom   Equipment Recommendations  None recommended by PT    Recommendations for Other Services       Precautions / Restrictions Precautions Precautions: Fall Restrictions Weight Bearing Restrictions: No     Mobility  Bed Mobility Overal bed mobility: Needs Assistance Bed Mobility: Supine  to Sit, Sit to Supine     Supine to sit: Supervision Sit to supine: Supervision   General bed mobility comments: for safety/lines (undergoing LTM EEG)    Transfers Overall transfer level: Needs assistance Equipment used: Ambulation equipment used Transfers: Sit to/from Stand, Bed to chair/wheelchair/BSC Sit to Stand: Min guard Stand pivot transfers: Min guard         General transfer comment: min guard for safety    Ambulation/Gait Ambulation/Gait assistance: Min guard Gait Distance (Feet): 300 Feet Assistive device: Rolling walker (2 wheels) Gait Pattern/deviations: Step-through pattern, Decreased stride length Gait velocity: decreased Gait velocity interpretation: 1.31 - 2.62 ft/sec, indicative of limited community ambulator   General Gait Details: RW utilized due to increased lethargy today. Steady gait with RW.   Stairs             Wheelchair Mobility    Modified Rankin (Stroke Patients Only)       Balance Overall balance assessment: Needs assistance Sitting-balance support: No upper extremity supported, Feet supported Sitting balance-Leahy Scale: Good     Standing balance support: Bilateral upper extremity supported, No upper extremity supported, During functional activity Standing balance-Leahy Scale: Fair                              Cognition Arousal/Alertness: Lethargic Behavior During Therapy: Flat affect Overall Cognitive Status: Impaired/Different from baseline Area of Impairment: Orientation, Attention, Memory, Following commands, Safety/judgement, Awareness, Problem solving                 Orientation Level: Disoriented to, Situation, Time Current Attention Level: Selective Memory: Decreased short-term memory Following Commands: Follows one step commands consistently Safety/Judgement: Decreased awareness  of safety, Decreased awareness of deficits Awareness: Intellectual Problem Solving: Difficulty sequencing,  Requires verbal cues          Exercises      General Comments General comments (skin integrity, edema, etc.): RN disconnected EEG for pt to amb in hallway. Reconnected upon return to room.      Pertinent Vitals/Pain Pain Assessment Pain Assessment: 0-10 Pain Score: 6  Pain Location: R flank Pain Descriptors / Indicators: Grimacing, Sore, Discomfort Pain Intervention(s): Monitored during session, Repositioned    Home Living                          Prior Function            PT Goals (current goals can now be found in the care plan section) Acute Rehab PT Goals Patient Stated Goal: home Progress towards PT goals: Not progressing toward goals - comment (lethargy)    Frequency    Min 1X/week      PT Plan Discharge plan needs to be updated    Co-evaluation              AM-PAC PT "6 Clicks" Mobility   Outcome Measure  Help needed turning from your back to your side while in a flat bed without using bedrails?: None Help needed moving from lying on your back to sitting on the side of a flat bed without using bedrails?: A Little Help needed moving to and from a bed to a chair (including a wheelchair)?: A Little Help needed standing up from a chair using your arms (e.g., wheelchair or bedside chair)?: A Little Help needed to walk in hospital room?: A Little Help needed climbing 3-5 steps with a railing? : A Lot 6 Click Score: 18    End of Session Equipment Utilized During Treatment: Gait belt Activity Tolerance: Patient tolerated treatment well Patient left: in bed;with bed alarm set;with call bell/phone within reach Nurse Communication: Mobility status PT Visit Diagnosis: Difficulty in walking, not elsewhere classified (R26.2);Repeated falls (R29.6)     Time: [id number] PT Time Calculation (min) (ACUTE ONLY): 27 min  Charges:  $Gait Training: 23-37 mins                     Gloriann Loan., PT  Office # [phone]    Lorriane Shire 05/22/2022, 11:33 AM

## 2022-05-22 NOTE — Procedures (Signed)
Patient Name: Brendan Butler  MRN: [id number]  Epilepsy Attending: Lora Havens  Referring Physician/Provider: Thomasene Ripple, MD  Date: 05/21/2022 Duration: 26.52 mins  Patient history: 72yo M with ams. EEG to evaluate for seizure  Level of alertness: Awake, drowsy  AEDs during EEG study: Pregabalin  Technical aspects: This EEG study was done with scalp electrodes positioned according to the 10-20 International system of electrode placement. Electrical activity was reviewed with band pass filter of 1-70Hz , sensitivity of 7 uV/mm, display speed of 62mm/sec with a 60Hz  notched filter applied as appropriate. EEG data were recorded continuously and digitally stored.  Video monitoring was available and reviewed as appropriate.  Description: The posterior dominant rhythm consists of 7 Hz activity of moderate voltage (25-35 uV) seen predominantly in posterior head regions, symmetric and reactive to eye opening and eye closing. Drowsiness was characterized by attenuation of the posterior background rhythm. EEG showed continuous generalized 6-7hz  theta slowing. Physiologic photic driving was not seen during photic stimulation.  Hyperventilation was not performed.     ABNORMALITY - Background slow, generalized - Continuous slow, generalized  IMPRESSION: This study is suggestive of mild diffuse encephalopathy, nonspecific etiology. No seizures or epileptiform discharges were seen throughout the recording.  [name]

## 2022-05-22 NOTE — NC FL2 (Signed)
[place] LEVEL OF CARE FORM     IDENTIFICATION  Patient Name: Brendan Butler Birthdate: Mar 06, 1950 Sex: male Admission Date (Current Location): 05/20/2022  [facility] and [place] Number:  Herbalist and Address:  The . [facility], [place] [address] [place], [place] [address]      Provider Number: [id number]  Attending Physician Name and Address:  Kerney Elbe, DO  Relative Name and Phone Number:       Current Level of Care: Hospital Recommended Level of Care: [place] Prior Approval Number:    Date Approved/Denied:   PASRR Number:    Discharge Plan: SNF    Current Diagnoses: Patient Active Problem List   Diagnosis Date Noted   Atrial fibrillation with RVR ([place]) 05/20/2022   MDD (major depressive disorder), recurrent episode, mild ([place]) 05/10/2022   Delirium due to another medical condition 05/10/2022   Atrial flutter with rapid ventricular response ([place]) 05/06/2022   Hypokalemia 05/06/2022   Altered mental status 05/06/2022   Pulmonary nodules 12/27/2021   Acute cholecystitis 12/25/2021   Symptomatic bradycardia 12/25/2021   Preoperative cardiovascular examination    Intra-abdominal abscess ([place]) 01/17/2021   Lung nodule 01/17/2021   Acute-on-chronic kidney injury ([place]) 01/17/2021   Aneurysm of ascending aorta ([place]) 01/17/2021   Stage 3a chronic kidney disease (CKD) ([place]) 01/17/2021   Weight loss 01/17/2021   Bronchiectasis ([place]) 01/17/2021   Chronic cholecystitis 01/16/2021   Traumatic hematoma of left popliteal region 01/28/2016   DJD (degenerative joint disease) of pelvis 01/28/2016   Atypical chest pain    Chest pain 01/27/2016   Unstable angina ([place]) 04/21/2013   Leukocytosis 04/21/2013   Subsequent ST elevation (STEMI) myocardial infarction of anterior wall within 4 weeks of initial infarction ([place]) 01/08/2013   Illiteracy and low-level literacy 01/08/2013   Aspirin allergy- hives  01/06/2013   STEMI 12/29/12 Rx'd with LAD DES with early stent thrombosis, STEMI-PCI 01/05/14 01/05/2013   Coronary artery disease involving native coronary artery of native heart without angina pectoris 06/29/2007   Dyslipidemia 06/25/2007   Essential hypertension 06/25/2007   ALLERGIC RHINITIS 06/25/2007   OSA on CPAP 06/25/2007    Orientation RESPIRATION BLADDER Height & Weight     Self, Place  Normal Continent Weight: 199 lb 1.2 oz (90.3 kg) Height:  5\' 10"  (177.8 cm)  BEHAVIORAL SYMPTOMS/MOOD NEUROLOGICAL BOWEL NUTRITION STATUS      Continent Diet (please see discharge summary)  AMBULATORY STATUS COMMUNICATION OF NEEDS Skin   Limited Assist Verbally Normal                       Personal Care Assistance Level of Assistance  Bathing, Feeding, Dressing Bathing Assistance: Limited assistance Feeding assistance: Independent Dressing Assistance: Limited assistance     Functional Limitations Info  Sight, Hearing, Speech Sight Info: Adequate Hearing Info: Adequate      SPECIAL CARE FACTORS FREQUENCY  PT (By licensed PT), OT (By licensed OT)     PT Frequency: 5x per week OT Frequency: 5x per week            Contractures Contractures Info: Not present    Additional Factors Info  Code Status, Allergies, Psychotropic Code Status Info: FULL code Allergies Info: Levitra, Aspirin,Lipitor Psychotropic Info: QUEtiapine (SEROQUEL) tablet 25 mg         Current Medications (05/22/2022):  This is the current hospital active medication list Current Facility-Administered Medications  Medication Dose Route Frequency Provider Last Rate  Last Admin   acetaminophen (TYLENOL) tablet 650 mg  650 mg Oral Q4H PRN Emilee Hero, MD       buPROPion (WELLBUTRIN XL) 24 hr tablet 300 mg  300 mg Oral Daily Emilee Hero, MD   300 mg at 05/22/22 0854   clopidogrel (PLAVIX) tablet 75 mg  75 mg Oral Daily Emilee Hero, MD   75 mg at 05/22/22 0854   empagliflozin (JARDIANCE) tablet  10 mg  10 mg Oral Daily Lonzo Cloud L, PA-C   10 mg at 05/22/22 0854   [START ON 05/23/2022] metoprolol succinate (TOPROL-XL) 24 hr tablet 100 mg  100 mg Oral Daily Sande Rives E, PA-C       metoprolol tartrate (LOPRESSOR) tablet 50 mg  50 mg Oral BID Sande Rives E, PA-C   50 mg at 05/22/22 0854   ondansetron (ZOFRAN) injection 4 mg  4 mg Intravenous Q6H PRN Dorrell, Herbie [place], MD       pantoprazole (PROTONIX) EC tablet 40 mg  40 mg Oral Daily Emilee Hero, MD   40 mg at 05/22/22 0854   PARoxetine (PAXIL) tablet 30 mg  30 mg Oral Daily Dorrell, Herbie [place], MD   30 mg at 05/22/22 0855   pregabalin (LYRICA) capsule 150 mg  150 mg Oral TID Raiford Noble [place], DO   150 mg at 05/22/22 1552   QUEtiapine (SEROQUEL) tablet 25 mg  25 mg Oral QHS Emilee Hero, MD   25 mg at 05/21/22 2210   rivaroxaban (XARELTO) tablet 20 mg  20 mg Oral Q supper Jardin, Carla G, RPH       rosuvastatin (CRESTOR) tablet 10 mg  10 mg Oral Daily Emilee Hero, MD   10 mg at 05/22/22 [id number]   sacubitril-valsartan (ENTRESTO) 24-26 mg per tablet  1 tablet Oral BID Emilee Hero, MD   1 tablet at 05/22/22 [id number]     Discharge Medications: Please see discharge summary for a list of discharge medications.  Relevant Imaging Results:  Relevant Lab Results:   Additional Information SSN [id number]  Vinie Sill, LCSW

## 2022-05-22 NOTE — TOC Benefit Eligibility Note (Signed)
Patient Teacher, English as a foreign language completed.    The patient is currently admitted and upon discharge could be taking Xarelto 20 mg.  The current 30 day co-pay is $0.00.   The patient is insured through [employer] Part D   This test claim was processed through [place] amounts may vary at other pharmacies due to pharmacy/plan contracts, or as the patient moves through the different stages of their insurance plan.  Lyndel Safe, [place] Patient Advocate Specialist [place] Patient Advocate Team Direct Number: [phone]  Fax: [phone]

## 2022-05-22 NOTE — Progress Notes (Signed)
PROGRESS NOTE    Brendan Butler  [id number] DOB: 09-25-1950 DOA: 05/20/2022 PCP: Wenda Low, MD   Brief Narrative:  [name] is a 72 y.o. male with medical history significant of hypertension, hyperlipidemia, CAD, CHF, obstructive sleep apnea who presented to the hospital with altered mental status and falls.  Patient was recently admitted and discharged on 3/14.  At that time he was found to have similar complaints.  In the ER he was in A-fib with RVR and given dill drip.  He was found to be in a flutter was eventually cardioverted.  Echocardiogram showed reduced EF.  He was started on goal-directed medical therapy as well as Eliquis.  Plavix was discontinued.  Patient was planned to have cardiology follow-up.  Since discharge patient is not taking any of his medications.  Family found him to not be eating and be extremely weak.  Is also had numerous falls.  In the ER he was tachycardic in the 100s.  Labs were obtained which were notable for sodium 141, potassium 3.7, creatinine 1.26, WBC 9.1, hemoglobin 12.8, troponin 14, 12, urinalysis negative for infection.  Chest x-ray was obtained which showed no acute cardiopulmonary disease.  CT head was obtained which showed no acute intracranial abnormalities.  CT chest abdomen pelvis was obtained which showed minimally displaced rib fractures and hematoma of the right flank.  Patient was started on diltiazem drip for aflutter with good rate control.  He was admitted for further management.    **Stopped his diltiazem drip given his low EF noted.  Cardiology was consulted and he was initiated on IV amiodarone but this was held then.  He has had prior hospitalizations and he left AMA and continues to have inconsistent medication adherence after his cardioversion from last hospitalization.  Remains significantly confused and has been worsening so neurology has been consulted for further evaluation.  The cardiologist have held his IV amiodarone  given his inconsistent anticoagulant use and current hemodynamic stability.  They are uptitrating his metoprolol and if his A-flutter does not convert in the next days they are going to Consider TEE cardioversion however it is not able to adhere to anticoagulation it may not be worthwhile.   Patient had a full workup for his acute delirium within the last month and neurology Dr. Luevenia Maxin has been consulted and recommending LTM EEG.  EEG done and showed is suggestive of mild diffuse encephalopathy is nonspecific in etiology with no seizures or epileptiform discharges seen throughout.  Palliative care has been consulted for further goals of care discussion.  PT recommending SNF for discharge.   Assessment and Plan:  Atrial fibrillation/flutter with RVR ([place]) -Has some medication noncompliance -Recent hospitalization for similar complaints -Responded to diltiazem drip however I stopped Dilt drip given his low EF noted on his TEE and have initiated IV amiodarone and gave him a bolus but cardiology has now held his drip -Cardiology recommends continue to hold amiodarone given that this is not a good long-term option and have asked the nursing staff to ambulate the patient to see how he tolerates this and continuing beta-blockers and apixaban - EKG shows atrial flutter -Continue anticoagulation with apixaban -PT OT evaluated and recommending SNF if the family cannot provide 24-hour supervision -Today he is rate controlled and in a flutter and is overall asymptomatic.  Cardiology feels that they are not pursue repeat cardioversion unless he demonstrates adherence to anticoagulation and if he maintains rate control would be reasonable to consider repeating a cardioversion in  3 to 4 weeks -He requested palliative care consultation so this is been placed   Depression -continue bupropion, Paxil, Seroquel   Hyperlipidemia -Continue Rosuvastatin 10 mg p.o. daily  Leukocytosis -Unclear etiology for  reactive -WBC Trend: Recent Labs  Lab 05/07/22 0200 05/08/22 0516 05/09/22 0147 05/10/22 0142 05/20/22 1532 05/21/22 0113 05/22/22 0118  WBC 18.7* 11.2* 10.2 11.1* 9.1 9.8 12.0*  -Continue monitor for signs and symptoms of infection -UA is negative and chest x-ray showed no active disease -CT Chest/Abd/Pelvis done and showed " Acute minimally displaced right posterior 11th and 12th rib fractures in 2 different places. Acute minimally displaced right posterolateral 8th and 9th ribs. Associated subcutaneus soft tissue edema and hematoma of the right flank. Trace right pleural effusion. No acute intra-abdominal or intrapelvic traumatic injury. No acute fracture or traumatic malalignment of the thoracic or lumbar spine. Other imaging findings of potential clinical significance: Status post left nephrectomy. Aortic Atherosclerosis" -Repeat CBC in a.m. and may need to pursue further workup if continues to worsen  Heart failure with reduced ejection fraction -continue Entresto, metoprolol -Also continue Jardiance -Strict I's and O's and daily weights; No intake or output data in the 24 hours ending 05/22/22 2041  -Continue to monitor for signs and symptoms of volume overload; no overt volume overload noted -Radiology been consulted and recommending continuing Lopressor cardiovascular ED as well as Entresto 24-26 mg twice daily as well as Jardiance and they are recommending continuing these medications   Falls with multiple fractures Displaced Ribs -Having multiple falls and could be in the setting of his a flutter -Continue with flutter valve and incentive spirometry -Judicious use of narcotic pain control -Continue monitor and c/w Supportive Care -PT OT recommending SNF if the patient's family cannot provide 24-hour care; PT now recommending SNF but OT is recommending a follow-up   CAD status post multiple PCI's to the LAD in 2014 -continue Plavix, metoprolol tartrate, Entresto as well as  rosuvastatin -Cannot use aspirin due to documented allergy -Cardiology consulted and will continue metoprolol tartrate but this has been uptitrated to 50 mg p.o. twice daily -Cardiology feels that this appears stable at this time and no current signs of angina   Recurrent falls -will need PT evaluation. -She was not as altered and was more awake and alert and oriented.  PT recommending SNF   Confusion and acute encephalopathy with likely progressive Cognitive Decline -Added workup last hospitalization as pretty extensive -Neurology consulted here this hospitalization and awaiting evaluation but recommending LTM EEG; EEG done and showed that the study is suggestive of mild diffuse encephalopathy of nonspecific etiology with no seizure or epileptiform discharges seen throughout the recording -Head CT done here this hospitalization showed no acute intracranial abnormality -UA is negative for infection -He is unable to safely care for himself at home -Psychiatry was consulted last admission -Neurology Dr. Herbie Baltimore will see the patient in consultation for further evaluation recommendations   CKD Stage II in the setting of history of left nephrectomy -BUN/Cr Trend: Recent Labs  Lab 05/07/22 0940 05/08/22 0516 05/09/22 0147 05/10/22 0142 05/20/22 1532 05/21/22 0113 05/22/22 0118  BUN 21 16 12 15 14 15 21   CREATININE 1.71* 1.34* 1.32* 1.41* 1.26* 1.17 1.39*  -Avoid Nephrotoxic Medications, Contrast Dyes, Hypotension and Dehydration to Ensure Adequate Renal Perfusion and will need to Renally Adjust Meds -Continue to Monitor and Trend Renal Function carefully and repeat CMP in the AM     Hypoalbuminemia -Patient's Albumin Trend: Recent Labs  Lab 05/20/22  1532 05/21/22 0113 05/22/22 0118  ALBUMIN 2.7* 2.7* 2.7*  -Continue to Monitor and Trend and repeat CMP in the AM   DVT prophylaxis: Place TED hose Start: 05/20/22 2225 rivaroxaban (XARELTO) tablet 20 mg    Code Status: Full  Code Family Communication: No family present at bedside  Disposition Plan:  Level of care: Telemetry Cardiac Status is: Inpatient Remains inpatient appropriate because: Pending further clinical improvement and clearance by specialists   Consultants:  Cardiology Neurology Palliative care medicine  Procedures:  As delineated as above  Antimicrobials:  Anti-infectives (From admission, onward)    None       Subjective: Seen and examined at bedside and the patient was doing okay but was complaining of some rib pain.  No nausea or vomiting.  Is much more awake and alert today.  Heart rate still in a flutter but rate controlled.  No lightheadedness or dizziness.  No other concerns or complaints at this time.  Objective: Vitals:   05/22/22 0833 05/22/22 0841 05/22/22 1231 05/22/22 1953  BP:  134/69 125/72 117/77  Pulse:  73 72 73  Resp:  16 13 16   Temp:  (!) 97.5 F (36.4 C) 97.6 F (36.4 C) 97.8 F (36.6 C)  TempSrc:  Oral Oral Axillary  SpO2: 95% 98% 97% 96%  Weight:      Height:       No intake or output data in the 24 hours ending 05/22/22 2036 Filed Weights   05/20/22 1436 05/21/22 0704 05/22/22 0650  Weight: 93.6 kg 86.4 kg 90.3 kg   Examination: Physical Exam:  Constitutional: WN/WD overweight chronically ill-appearing elderly Caucasian male in no acute distress Respiratory: Diminished to auscultation bilaterally with some coarse breath sounds, no wheezing, rales, rhonchi or crackles. Normal respiratory effort and patient is not tachypenic. No accessory muscle use.  Unlabored breathing Cardiovascular: Irregularly irregular, no murmurs / rubs / gallops. S1 and S2 auscultated.  Minimal appreciable lower extremity edema Abdomen: Soft, non-tender, non-distended.  Bowel sounds positive.  GU: Deferred. Musculoskeletal: No clubbing / cyanosis of digits/nails. No joint deformity upper and lower extremities.  Skin: No rashes, lesions, ulcers on limited skin evaluation but  does have a hematoma on the right side from his fall.. No induration; Warm and dry.  Neurologic: CN 2-12 grossly intact with no focal deficits. Romberg sign and cerebellar reflexes not assessed.  Psychiatric: Normal judgment and insight.  Patient had a pleasant mood and affect and he is awake and alert and oriented x 3 today  Data Reviewed: I have personally reviewed following labs and imaging studies  CBC: Recent Labs  Lab 05/20/22 1532 05/21/22 0113 05/22/22 0118  WBC 9.1 9.8 12.0*  NEUTROABS  --   --  6.8  HGB 12.8* 13.0 12.4*  HCT 38.9* 38.8* 36.4*  MCV 86.6 86.2 85.2  PLT 241 242 [id number]   Basic Metabolic Panel: Recent Labs  Lab 05/20/22 1532 05/21/22 0113 05/22/22 0118  NA 141 137 139  K 3.7 3.6 3.7  CL 105 107 104  CO2 26 22 25   GLUCOSE 125* 112* 93  BUN 14 15 21   CREATININE 1.26* 1.17 1.39*  CALCIUM 8.8* 8.5* 8.5*  MG  --  1.8 2.2  PHOS  --  3.7 3.4   GFR: Estimated Creatinine Clearance: 56.7 mL/min (A) (by C-G formula based on SCr of 1.39 mg/dL (H)). Liver Function Tests: Recent Labs  Lab 05/20/22 1532 05/21/22 0113 05/22/22 0118  AST 18 19 17   ALT 19 20 20  ALKPHOS 84 73 83  BILITOT 1.4* 1.3* 1.2  PROT 6.1* 5.8* 5.6*  ALBUMIN 2.7* 2.7* 2.7*   No results for input(s): "LIPASE", "AMYLASE" in the last 168 hours. No results for input(s): "AMMONIA" in the last 168 hours. Coagulation Profile: No results for input(s): "INR", "PROTIME" in the last 168 hours. Cardiac Enzymes: No results for input(s): "CKTOTAL", "CKMB", "CKMBINDEX", "TROPONINI" in the last 168 hours. BNP (last 3 results) No results for input(s): "PROBNP" in the last 8760 hours. HbA1C: No results for input(s): "HGBA1C" in the last 72 hours. CBG: Recent Labs  Lab 05/21/22 0628  GLUCAP 109*   Lipid Profile: No results for input(s): "CHOL", "HDL", "LDLCALC", "TRIG", "CHOLHDL", "LDLDIRECT" in the last 72 hours. Thyroid Function Tests: No results for input(s): "TSH", "T4TOTAL", "FREET4",  "T3FREE", "THYROIDAB" in the last 72 hours. Anemia Panel: No results for input(s): "VITAMINB12", "FOLATE", "FERRITIN", "TIBC", "IRON", "RETICCTPCT" in the last 72 hours. Sepsis Labs: No results for input(s): "PROCALCITON", "LATICACIDVEN" in the last 168 hours.  No results found for this or any previous visit (from the past 240 hour(s)).   Radiology Studies: Overnight EEG with video  Result Date: 05/22/2022 Lora Havens, MD     05/22/2022  9:09 AM Patient Name: Brendan Butler MRN: [id number] Epilepsy Attending: Lora Havens Referring Physician/Provider: Kerney Elbe, DO Duration: 05/21/2022 2108 to 05/22/2022 0900  Patient history: 72yo M with ams. EEG to evaluate for seizure  Level of alertness: Awake, asleep  AEDs during EEG study: Pregabalin  Technical aspects: This EEG study was done with scalp electrodes positioned according to the 10-20 International system of electrode placement. Electrical activity was reviewed with band pass filter of 1-70Hz , sensitivity of 7 uV/mm, display speed of 7mm/sec with a 60Hz  notched filter applied as appropriate. EEG data were recorded continuously and digitally stored.  Video monitoring was available and reviewed as appropriate.  Description: The posterior dominant rhythm consists of 7 Hz activity of moderate voltage (25-35 uV) seen predominantly in posterior head regions, symmetric and reactive to eye opening and eye closing. Sleep was characterized by vertex waves, sleep spindles (12-14hz ), maximal fronto-central region. . EEG showed continuous generalized 6-7hz  theta slowing.  ABNORMALITY - Background slow, generalized - Continuous slow, generalized  IMPRESSION: This study is suggestive of mild diffuse encephalopathy, nonspecific etiology. No seizures or epileptiform discharges were seen throughout the recording.  Lora Havens   EEG adult  Result Date: 05/22/2022 Lora Havens, MD     05/22/2022  8:54 AM Patient Name: LENIS MOHL MRN:  [id number] Epilepsy Attending: Lora Havens Referring Physician/Provider: Thomasene Ripple, MD Date: 05/21/2022 Duration: 26.52 mins Patient history: 72yo M with ams. EEG to evaluate for seizure Level of alertness: Awake, drowsy AEDs during EEG study: Pregabalin Technical aspects: This EEG study was done with scalp electrodes positioned according to the 10-20 International system of electrode placement. Electrical activity was reviewed with band pass filter of 1-70Hz , sensitivity of 7 uV/mm, display speed of 62mm/sec with a 60Hz  notched filter applied as appropriate. EEG data were recorded continuously and digitally stored.  Video monitoring was available and reviewed as appropriate. Description: The posterior dominant rhythm consists of 7 Hz activity of moderate voltage (25-35 uV) seen predominantly in posterior head regions, symmetric and reactive to eye opening and eye closing. Drowsiness was characterized by attenuation of the posterior background rhythm. EEG showed continuous generalized 6-7hz  theta slowing. Physiologic photic driving was not seen during photic stimulation.  Hyperventilation was not performed.  ABNORMALITY - Background slow, generalized - Continuous slow, generalized IMPRESSION: This study is suggestive of mild diffuse encephalopathy, nonspecific etiology. No seizures or epileptiform discharges were seen throughout the recording. Priyanka Barbra Sarks    Scheduled Meds:  buPROPion  300 mg Oral Daily   clopidogrel  75 mg Oral Daily   empagliflozin  10 mg Oral Daily   [START ON 05/23/2022] metoprolol succinate  100 mg Oral Daily   metoprolol tartrate  50 mg Oral BID   pantoprazole  40 mg Oral Daily   PARoxetine  30 mg Oral Daily   pregabalin  150 mg Oral TID   QUEtiapine  25 mg Oral QHS   rivaroxaban  20 mg Oral Q supper   rosuvastatin  10 mg Oral Daily   sacubitril-valsartan  1 tablet Oral BID   Continuous Infusions:   LOS: 1 day   Raiford Noble, DO [facility] Available via Epic secure chat 7am-7pm After these hours, please refer to coverage provider listed on [email or web address] 05/22/2022, 8:36 PM

## 2022-05-22 NOTE — Progress Notes (Signed)
PT Cancellation Note  Patient Details Name: Brendan Butler MRN: [id number] DOB: [birth year]   Cancelled Treatment:    Reason Eval/Treat Not Completed: Patient at procedure or test/unavailable. Pt undergoing LTM EEG.    Lorriane Shire 05/22/2022, 7:43 AM

## 2022-05-22 NOTE — Progress Notes (Signed)
ANTICOAGULATION CONSULT NOTE - Initial Consult  Pharmacy Consult for Apixaban transition to Rivaroxaban Indication: atrial fibrillation  Allergies  Allergen Reactions   Levitra [Vardenafil] Other (See Comments)    Blurred vision   Aspirin Hives   Lipitor [Atorvastatin] Other (See Comments)    Muscle cramps; patient states he does not take medication at home    Patient Measurements: Height: 5\' 10"  (177.8 cm) Weight: 90.3 kg (199 lb 1.2 oz) IBW/kg (Calculated) : 73  Vital Signs: Temp: 97.6 F (36.4 C) (03/26 1231) Temp Source: Oral (03/26 1231) BP: 125/72 (03/26 1231) Pulse Rate: 72 (03/26 1231)  Labs: Recent Labs    05/20/22 1532 05/20/22 1820 05/21/22 0113 05/22/22 0118  HGB 12.8*  --  13.0 12.4*  HCT 38.9*  --  38.8* 36.4*  PLT 241  --  242 244  CREATININE 1.26*  --  1.17 1.39*  TROPONINIHS 14 12  --   --     Estimated Creatinine Clearance: 56.7 mL/min (A) (by C-G formula based on SCr of 1.39 mg/dL (H)).   Medical History: Past Medical History:  Diagnosis Date   Acute myocardial infarction of other anterior wall, initial episode of care 12/29/2012   Promus stent to LAD   Acute ST segment elevation MI ([place]) 01/05/2013   secondary to thrombus in stent; Brilintta changed to Effient pt with ASA allergy   Coronary artery disease 2001   stent to LAD and patent 2007 on cath   Echocardiogram abnormal 01/06/2013   EF [id number], grade I diastolic dysfunction   Erectile dysfunction    Hyperlipidemia LDL goal <70    Hypertension    OSA (obstructive sleep apnea)    No CPAP use overnight    Medications:  Medications Prior to Admission  Medication Sig Dispense Refill Last Dose   apixaban (ELIQUIS) 5 MG TABS tablet Take 1 tablet (5 mg total) by mouth 2 (two) times daily. (Patient not taking: Reported on 05/22/2022) 120 tablet 0 Not Taking   buPROPion (WELLBUTRIN XL) 150 MG 24 hr tablet Take 2 tablets (300 mg total) by mouth daily. (Patient not taking: Reported on  05/22/2022) 30 tablet 2 Not Taking   clopidogrel (PLAVIX) 75 MG tablet Take 1 tablet (75 mg total) by mouth daily. (Patient not taking: Reported on 05/22/2022) 30 tablet 0 Not Taking   cyanocobalamin (VITAMIN B12) 1000 MCG tablet Take 1 tablet (1,000 mcg total) by mouth daily. (Patient not taking: Reported on 05/22/2022) 100 tablet 0 Not Taking   fluticasone (FLONASE) 50 MCG/ACT nasal spray Place 1 spray into both nostrils daily. (Patient not taking: Reported on 05/22/2022)   Not Taking   furosemide (LASIX) 20 MG tablet Take 1 tablet (20 mg total) by mouth daily. (Patient not taking: Reported on 05/22/2022) 30 tablet 5 Not Taking   metoprolol tartrate (LOPRESSOR) 25 MG tablet Take 1 tablet (25 mg total) by mouth 2 (two) times daily. (Patient not taking: Reported on 05/22/2022) 60 tablet 0 Not Taking   nitroGLYCERIN (NITROSTAT) 0.4 MG SL tablet Place 1 tablet (0.4 mg total) under the tongue every 5 (five) minutes x 3 doses as needed for chest pain. NEED OV. (Patient not taking: Reported on 05/22/2022) 25 tablet 0 Not Taking   pantoprazole (PROTONIX) 40 MG tablet Take 1 tablet (40 mg total) by mouth daily. (Patient not taking: Reported on 05/22/2022) 30 tablet 0 Not Taking   PARoxetine (PAXIL) 30 MG tablet Take 30 mg by mouth daily. (Patient not taking: Reported on 05/22/2022)   Not Taking  potassium chloride (KLOR-CON M) 10 MEQ tablet Take 10 mEq by mouth daily. (Patient not taking: Reported on 05/22/2022)   Not Taking   pregabalin (LYRICA) 150 MG capsule Take 450 mg by mouth daily. (Patient not taking: Reported on 05/22/2022)   Not Taking   QUEtiapine (SEROQUEL) 25 MG tablet Take 1 tablet (25 mg total) by mouth at bedtime. (Patient not taking: Reported on 05/22/2022) 30 tablet 0 Not Taking   ramelteon (ROZEREM) 8 MG tablet Take 1 tablet (8 mg total) by mouth at bedtime. (Patient not taking: Reported on 05/22/2022) 30 tablet 0 Not Taking   rosuvastatin (CRESTOR) 10 MG tablet Take 10 mg by mouth daily. (Patient not  taking: Reported on 05/22/2022)   Not Taking   sacubitril-valsartan (ENTRESTO) 24-26 MG Take 1 tablet by mouth 2 (two) times daily. (Patient not taking: Reported on 05/22/2022) 60 tablet 0 Not Taking    Assessment: 72 yo M admitted with AMS and falls. Pt was found to be in Afib w/ RVR in a recent admission and was initiated on apixaban 5mg  BID on 05/08/22. Given pt's need for simplified medication regimen, provider has consulted pharmacy to transition pt to rivaroxaban for Afib.   Hgb 12.4, Plt 244 - stable No s/sx of bleeding Last apixaban dose of 5mg  given at 08:54 on 05/22/22  Goal of Therapy:  Monitor platelets by anticoagulation protocol: Yes   Plan:  Discontinue apixaban Start rivaroxaban 20mg  po daily with supper this evening at 21:00 (~12h after AM dose of apixaban) Monitor daily CBC and for s/sx of bleeding Rivaroxaban copay cost is $0/mo Pharmacy to provide patient education on rivaroxaban prior to discharge   Luisa Hart, PharmD, BCPS Clinical Pharmacist 05/22/2022 3:41 PM   Please refer to [facility] for pharmacy phone number

## 2022-05-22 NOTE — Procedures (Signed)
Patient Name: PARMVEER URBANEK  MRN: [id number]  Epilepsy Attending: Lora Havens  Referring Physician/Provider: Kerney Elbe, DO  Duration: 05/21/2022 2108 to 05/22/2022 2108   Patient history: 72yo M with ams. EEG to evaluate for seizure   Level of alertness: Awake, asleep   AEDs during EEG study: Pregabalin   Technical aspects: This EEG study was done with scalp electrodes positioned according to the 10-20 International system of electrode placement. Electrical activity was reviewed with band pass filter of 1-70Hz , sensitivity of 7 uV/mm, display speed of 13mm/sec with a 60Hz  notched filter applied as appropriate. EEG data were recorded continuously and digitally stored.  Video monitoring was available and reviewed as appropriate.   Description: The posterior dominant rhythm consists of 7 Hz activity of moderate voltage (25-35 uV) seen predominantly in posterior head regions, symmetric and reactive to eye opening and eye closing. Sleep was characterized by vertex waves, sleep spindles (12-14hz ), maximal fronto-central region. . EEG showed continuous generalized 6-7hz  theta slowing.    ABNORMALITY - Background slow, generalized - Continuous slow, generalized   IMPRESSION: This study is suggestive of mild diffuse encephalopathy, nonspecific etiology. No seizures or epileptiform discharges were seen throughout the recording.   Ajah Vanhoose Barbra Sarks

## 2022-05-22 NOTE — Progress Notes (Signed)
Rounding Note    Patient Name: Brendan Butler Date of Encounter: 05/22/2022  [place] Cardiologist: [name], MD   Subjective   Patient still remains to be pleasant without any signs of agitation.  Heart rate has been controlled fluctuating between 70 to 90 bpm.  Patient has no complaints and improved lightheadness, however still confused.  We discussed possible placement to assisted facility, patient still would like time to think about this however seems amenable to plan.  Inpatient Medications    Scheduled Meds:  apixaban  5 mg Oral BID   buPROPion  300 mg Oral Daily   clopidogrel  75 mg Oral Daily   empagliflozin  10 mg Oral Daily   metoprolol tartrate  50 mg Oral BID   pantoprazole  40 mg Oral Daily   PARoxetine  30 mg Oral Daily   pregabalin  150 mg Oral TID   QUEtiapine  25 mg Oral QHS   rosuvastatin  10 mg Oral Daily   sacubitril-valsartan  1 tablet Oral BID   Continuous Infusions:  PRN Meds: acetaminophen, ondansetron (ZOFRAN) IV   Vital Signs    Vitals:   05/22/22 0448 05/22/22 0650 05/22/22 0833 05/22/22 0841  BP: 104/72   134/69  Pulse: 75 74  73  Resp: 16 14  16   Temp: 98 F (36.7 C)   (!) 97.5 F (36.4 C)  TempSrc: Oral   Oral  SpO2: 97% 95% 95% 98%  Weight:  90.3 kg    Height:        Intake/Output Summary (Last 24 hours) at 05/22/2022 0932 Last data filed at 05/21/2022 2000 Gross per 24 hour  Intake 240.73 ml  Output --  Net 240.73 ml      05/22/2022    6:50 AM 05/21/2022    7:04 AM 05/20/2022    2:36 PM  Last 3 Weights  Weight (lbs) 199 lb 1.2 oz 190 lb 8 oz 206 lb 5.6 oz  Weight (kg) 90.3 kg 86.41 kg 93.6 kg      Telemetry    Atrial flutter heart rate of 80- Personally Reviewed  ECG    No new - Personally Reviewed  Physical Exam   GEN: No acute distress.   Neck: No JVD Cardiac: RRR, no murmurs, rubs, or gallops.  Respiratory: Clear to auscultation bilaterally. GI: Soft, nontender, non-distended  MS: No  edema; No deformity. Neuro:  Nonfocal  Psych: Normal affect   Labs    High Sensitivity Troponin:   Recent Labs  Lab 05/06/22 1850 05/06/22 2110 05/20/22 1532 05/20/22 1820  TROPONINIHS 28* 28* 14 12     Chemistry Recent Labs  Lab 05/20/22 1532 05/21/22 0113 05/22/22 0118  NA 141 137 139  K 3.7 3.6 3.7  CL 105 107 104  CO2 26 22 25   GLUCOSE 125* 112* 93  BUN 14 15 21   CREATININE 1.26* 1.17 1.39*  CALCIUM 8.8* 8.5* 8.5*  MG  --  1.8 2.2  PROT 6.1* 5.8* 5.6*  ALBUMIN 2.7* 2.7* 2.7*  AST 18 19 17   ALT 19 20 20   ALKPHOS 84 73 83  BILITOT 1.4* 1.3* 1.2  GFRNONAA >60 >60 55*  ANIONGAP 10 8 10     Lipids No results for input(s): "CHOL", "TRIG", "HDL", "LABVLDL", "LDLCALC", "CHOLHDL" in the last 168 hours.  Hematology Recent Labs  Lab 05/20/22 1532 05/21/22 0113 05/22/22 0118  WBC 9.1 9.8 12.0*  RBC 4.49 4.50 4.27  HGB 12.8* 13.0 12.4*  HCT 38.9*  38.8* 36.4*  MCV 86.6 86.2 85.2  MCH 28.5 28.9 29.0  MCHC 32.9 33.5 34.1  RDW 13.0 13.0 13.2  PLT 241 242 244   Thyroid No results for input(s): "TSH", "FREET4" in the last 168 hours.  BNPNo results for input(s): "BNP", "PROBNP" in the last 168 hours.  DDimer No results for input(s): "DDIMER" in the last 168 hours.   Radiology    Overnight EEG with video  Result Date: 05/22/2022 Lora Havens, MD     05/22/2022  9:09 AM Patient Name: JALAL OVERGAARD MRN: [id number] Epilepsy Attending: Lora Havens Referring Physician/Provider: Kerney Elbe, DO Duration: 05/21/2022 2108 to 05/22/2022 0900  Patient history: 72yo M with ams. EEG to evaluate for seizure  Level of alertness: Awake, asleep  AEDs during EEG study: Pregabalin  Technical aspects: This EEG study was done with scalp electrodes positioned according to the 10-20 International system of electrode placement. Electrical activity was reviewed with band pass filter of 1-70Hz , sensitivity of 7 uV/mm, display speed of 62mm/sec with a 60Hz  notched filter applied  as appropriate. EEG data were recorded continuously and digitally stored.  Video monitoring was available and reviewed as appropriate.  Description: The posterior dominant rhythm consists of 7 Hz activity of moderate voltage (25-35 uV) seen predominantly in posterior head regions, symmetric and reactive to eye opening and eye closing. Sleep was characterized by vertex waves, sleep spindles (12-14hz ), maximal fronto-central region. . EEG showed continuous generalized 6-7hz  theta slowing.  ABNORMALITY - Background slow, generalized - Continuous slow, generalized  IMPRESSION: This study is suggestive of mild diffuse encephalopathy, nonspecific etiology. No seizures or epileptiform discharges were seen throughout the recording.  Lora Havens   EEG adult  Result Date: 05/22/2022 Lora Havens, MD     05/22/2022  8:54 AM Patient Name: DAZIEL KILDAY MRN: [id number] Epilepsy Attending: Lora Havens Referring Physician/Provider: Thomasene Ripple, MD Date: 05/21/2022 Duration: 26.52 mins Patient history: 72yo M with ams. EEG to evaluate for seizure Level of alertness: Awake, drowsy AEDs during EEG study: Pregabalin Technical aspects: This EEG study was done with scalp electrodes positioned according to the 10-20 International system of electrode placement. Electrical activity was reviewed with band pass filter of 1-70Hz , sensitivity of 7 uV/mm, display speed of 63mm/sec with a 60Hz  notched filter applied as appropriate. EEG data were recorded continuously and digitally stored.  Video monitoring was available and reviewed as appropriate. Description: The posterior dominant rhythm consists of 7 Hz activity of moderate voltage (25-35 uV) seen predominantly in posterior head regions, symmetric and reactive to eye opening and eye closing. Drowsiness was characterized by attenuation of the posterior background rhythm. EEG showed continuous generalized 6-7hz  theta slowing. Physiologic photic driving was not seen  during photic stimulation.  Hyperventilation was not performed.   ABNORMALITY - Background slow, generalized - Continuous slow, generalized IMPRESSION: This study is suggestive of mild diffuse encephalopathy, nonspecific etiology. No seizures or epileptiform discharges were seen throughout the recording. Lora Havens   CT CHEST ABDOMEN PELVIS W CONTRAST  Result Date: 05/20/2022 CLINICAL DATA:  Polytrauma, blunt EXAM: CT CHEST, ABDOMEN, AND PELVIS WITH CONTRAST TECHNIQUE: Multidetector CT imaging of the chest, abdomen and pelvis was performed following the standard protocol during bolus administration of intravenous contrast. RADIATION DOSE REDUCTION: This exam was performed according to the departmental dose-optimization program which includes automated exposure control, adjustment of the mA and/or kV according to patient size and/or use of iterative reconstruction technique. CONTRAST:  33mL ISOVUE-370 IOPAMIDOL (  ISOVUE-370) INJECTION 76% COMPARISON:  PET CT 04/11/2022 FINDINGS: CHEST: Cardiovascular: No aortic injury. The thoracic aorta is normal in caliber. The heart is normal in size. No significant pericardial effusion. Coronary artery calcification with left anterior descending coronary artery stent. Mediastinum/Nodes: No pneumomediastinum. No mediastinal hematoma. The esophagus is unremarkable. The thyroid is unremarkable. The central airways are patent. No mediastinal, hilar, or axillary lymphadenopathy. Lungs/Pleura: Bilateral upper lobe thin walled subcentimeter cystic lesions. Chronic 3 mm metallic density within the right lower lobe (3:44). No focal consolidation. No pulmonary nodule. No pulmonary mass. No pulmonary contusion or laceration. No pneumatocele formation. Trace right pleural effusion.  No pneumothorax. No hemothorax. Musculoskeletal/Chest wall: No chest wall mass. Acute minimally displaced right posterior eleventh and twelfth ribs fractured in 2 different places. Acute minimally  displaced right posterolateral ninth and eighth ribs. No spinal fracture. Surgical hardware of the left humerus. ABDOMEN / PELVIS: Hepatobiliary: Not enlarged. No focal lesion. No laceration or subcapsular hematoma. Status post cholecystectomy.  No biliary ductal dilatation. Pancreas: Normal pancreatic contour. No main pancreatic duct dilatation. Spleen: Not enlarged. No focal lesion. No laceration, subcapsular hematoma, or vascular injury. Adrenals/Urinary Tract: No nodularity bilaterally. The right kidney is homogeneously enhancing. Status post left nephrectomy. No hydronephrosis. No contusion, laceration, or subcapsular hematoma. No injury to the vascular structures or collecting systems. No hydroureter. The urinary bladder is unremarkable. Stomach/Bowel: No small or large bowel wall thickening or dilatation. Colonic diverticulosis. The appendix is unremarkable. Vasculature/Lymphatics: Severe atherosclerotic plaque. No abdominal aorta or iliac aneurysm. No active contrast extravasation or pseudoaneurysm. No abdominal, pelvic, inguinal lymphadenopathy. Reproductive: Normal. Other: No simple free fluid ascites. No pneumoperitoneum. No hemoperitoneum. No mesenteric hematoma identified. No organized fluid collection. Musculoskeletal: Subcutaneus soft tissue edema and hematoma of the right flank. No acute pelvic fracture.  Chronic stable L1 compression fracture. Ports and Devices: None. IMPRESSION: 1. Acute minimally displaced right posterior 11th and 12th rib fractures in 2 different places. Acute minimally displaced right posterolateral 8th and 9th ribs. Associated subcutaneus soft tissue edema and hematoma of the right flank. 2. Trace right pleural effusion. 3. No acute intra-abdominal or intrapelvic traumatic injury. 4. No acute fracture or traumatic malalignment of the thoracic or lumbar spine. 5. Other imaging findings of potential clinical significance: Status post left nephrectomy. Aortic Atherosclerosis  (ICD10-I70.0). Electronically Signed   By: Iven Finn M.D.   On: 05/20/2022 21:08   CT HEAD WO CONTRAST (5MM)  Result Date: 05/20/2022 CLINICAL DATA:  Head trauma, minor (Age >= 65y) EXAM: CT HEAD WITHOUT CONTRAST TECHNIQUE: Contiguous axial images were obtained from the base of the skull through the vertex without intravenous contrast. RADIATION DOSE REDUCTION: This exam was performed according to the departmental dose-optimization program which includes automated exposure control, adjustment of the mA and/or kV according to patient size and/or use of iterative reconstruction technique. COMPARISON:  MRI head 05/07/2022, CT head 05/06/2022 FINDINGS: Brain: Patchy and confluent areas of decreased attenuation are noted throughout the deep and periventricular white matter of the cerebral hemispheres bilaterally, compatible with chronic microvascular ischemic disease. No evidence of large-territorial acute infarction. No parenchymal hemorrhage. No mass lesion. No extra-axial collection. No mass effect or midline shift. No hydrocephalus. Basilar cisterns are patent. Vascular: No hyperdense vessel. Atherosclerotic calcifications are present within the cavernous internal carotid arteries. Skull: No acute fracture or focal lesion. Sinuses/Orbits: Paranasal sinuses and mastoid air cells are clear. The orbits are unremarkable. Other: None. IMPRESSION: No acute intracranial abnormality. Electronically Signed   By: Clelia Croft.D.  On: 05/20/2022 19:41   DG Chest Portable 1 View  Result Date: 05/20/2022 CLINICAL DATA:  Fall. EXAM: PORTABLE CHEST 1 VIEW COMPARISON:  May 06, 2022. FINDINGS: The heart size and mediastinal contours are within normal limits. Both lungs are clear. The visualized skeletal structures are unremarkable. IMPRESSION: No active disease. Electronically Signed   By: Marijo Conception M.D.   On: 05/20/2022 16:17    Cardiac Studies   TEE 05/10/2022 Left Ventricle: Left ventricular  ejection fraction, by estimation, is 20  to 25%. The left ventricle has severely decreased function. The left  ventricular internal cavity size was normal in size.   Right Ventricle: The right ventricular size is normal. No increase in  right ventricular wall thickness. Right ventricular systolic function is  mildly reduced.   Left Atrium: Left atrial size was moderately dilated. No left atrial/left  atrial appendage thrombus was detected. The LAA emptying velocity was 39  cm/s.   Right Atrium: Right atrial size was mildly dilated.   Pericardium: There is no evidence of pericardial effusion.   Mitral Valve: The mitral valve is grossly normal. Mild mitral valve  regurgitation. No evidence of mitral valve stenosis.   Tricuspid Valve: The tricuspid valve is grossly normal. Tricuspid valve  regurgitation is trivial. No evidence of tricuspid stenosis.   Aortic Valve: The aortic valve is tricuspid. Aortic valve regurgitation is  not visualized. No aortic stenosis is present.   Pulmonic Valve: The pulmonic valve was grossly normal. Pulmonic valve  regurgitation is trivial. No evidence of pulmonic stenosis.   Aorta: The aortic root and ascending aorta are structurally normal, with  no evidence of dilitation. There is moderate (Grade III) layered plaque  involving the descending aorta.   Venous: The left upper pulmonary vein, left lower pulmonary vein, right  lower pulmonary vein and right upper pulmonary vein are normal.   IAS/Shunts: No atrial level shunt detected by color flow Doppler.   Patient Profile     72 y.o. male hx of CAD s/p stent multiple interventions to LAD, recent admission on 05/06/2022 for atrial flutter s/p cardioversion, CKD III, seizures, chronic HFrEF, pulmonary nodules, HTN, HLD  who was admitted on 05/21/2022 for the evaluation of recurrent atrial flutter at the request of Dr. Alfredia Ferguson.   Assessment & Plan   Recurrent atrial flutter status post TEE/DCCV Patient  with recent admission for atrial flutter who underwent DCCV on 05/10/2022 with successful conversion to NSR, now presenting with increased altered mental status, lightheadedness and fall with multiple rib fractures.   Currently he is being managed with rate control strategies including Lopressor 50 mg twice daily.  Heart rate appears to be variable however currently is less than 100.  Amio infusion has been stopped, may add back if hemodynamically unstable with uncontrolled rates. Not a good long term option du Will ask RN to ambulate him today and see how he tolerates.  Continue Lopressor 50 mg twice daily, Eliquis 5 mg twice daily Given significant decline in mentation, failure to thrive, noncompliance, and recent falls there is a significant concern whether or not chronic anticoagulation is appropriate for him.  However, I have discussed with both daughters and patient about possible discharge to an assisted living facility and they seem amenable. If he were to be d/c to an assisted living facility it would be safer for him to be placed on chronic anticoagulation.  Given concerns for anticoagulation as stated above this complicates the management of his atrial flutter as atrial flutter  is difficult to rate control. Would not recommend repeat cardioversion unless he can be compliant with anticoagulation. Also, do not think he would be a candidate for ablation due to other comorbidities.  HFrEF HTN Patient has had a significant decrease in EF when comparing echo from 2023. TEE during recent admission in the setting of atrial flutter showed an LVEF of 20 to 25% with global hypokinesia.  Left atrium was moderately dilated. There was moderate grade 3 aortic plaque involving the descending; EF down from 55-60% in October 2023.  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 Patient started to undergo titration of GDMT; however there is significant concern for medication adherence likely due to altered  mental status and home environment. Continue Lopressor 50 mg twice daily. Will keep lopressor for now due to prioritization of rate control, however the ultimate goal is to switch to toprol XL prior to d/c due to reduced EF.  Continue Entresto 24-26 milligrams twice daily. Continue Jardiance 10mg  daily. Will discuss with MD about starting spirolactone 25mg  daily.  Euvolemic on exam no evidence of pulmonary congestion. Has minor complaints of chronic right leg swelling but has been well-controlled on home PO Lasix 20 mg. Can continue home lasix.  Repeat echocardiogram in 2 to 3 months after restoration of NSR to reassess LV function.   Fall with multiple rib fractures There is no evidence of seizure-like activity or loss of consciousness.  It is likely this could be attributed to feeling lightheaded due to the atrial flutter.  Neurology has performed the EEG with no significant findings besides mild diffuse encephalopathy with nonspecific etiology.  Altered mental status, failure to thrive, noncompliance Patient continues to have progressive decline in cognitive function without apparent etiology.  There is major concern for failure to thrive considering he is not eating, taking his medications, and now falling at home.  He has 2 daughters that are unable to actively take care of him.  Additionally daughters have major concerns about cousin advising him against medical care. Discussion of palliative care has been discussed with both daughters and are agreeable to plan.  All imaging results have been negative, negative urinalysis, no apparent infectious etiology. Recent admission was followed by psychiatry due to agitation/confusion Will refer for social work to get involved for possible placement for assisted living facility and help in determining patients capacity to make medical decisions and to establish POA if needed.   Coronary artery disease s/p multiple PCIs to LAD 2014 Appears stable at  this time with no current complaints of chest pain. Continue Plavix 75 mg Documented aspirin allergy    HLD Continue rosuvastatin 10 mg   CKD  Creatinine and GFR within normal limits on admission. This is a significant improvement from previous admission lab values.  Today's creatinine has increased to 1.39 which appears to be close to his baseline.  Pulmonary nodules  Defer to primary   For questions or updates, please contact [place] Please consult [email or web address] for contact info under        Signed, Bonnee Quin, PA-C  05/22/2022, 9:32 AM

## 2022-05-22 NOTE — Progress Notes (Signed)
Patient requested to speak with someone from PMT.  Met with patient and daughters, Brendan Butler and Brendan Butler, to advise that one of APPs would meet with them on 05/23/22. Brendan Butler will be with patient after 1 pm on 3/27. Brother is driving in from the beach. Amber would like to participate by phone during family meeting.  Family is requesting to complete advanced directives "so that we have things in placed when needed."   "Provided Hard Choices for Loving People" booklet.   Arna Snipe BSN, RN Palliative Medicine Team [phone]

## 2022-05-23 ENCOUNTER — Inpatient Hospital Stay (HOSPITAL_COMMUNITY): Payer: Medicare HMO

## 2022-05-23 DIAGNOSIS — Z515 Encounter for palliative care: Secondary | ICD-10-CM | POA: Diagnosis not present

## 2022-05-23 DIAGNOSIS — Z7189 Other specified counseling: Secondary | ICD-10-CM

## 2022-05-23 DIAGNOSIS — I4891 Unspecified atrial fibrillation: Secondary | ICD-10-CM | POA: Diagnosis not present

## 2022-05-23 DIAGNOSIS — R296 Repeated falls: Secondary | ICD-10-CM

## 2022-05-23 DIAGNOSIS — R410 Disorientation, unspecified: Secondary | ICD-10-CM | POA: Diagnosis not present

## 2022-05-23 LAB — CBC WITH DIFFERENTIAL/PLATELET
Abs Immature Granulocytes: 0.02 10*3/uL (ref 0.00–0.07)
Basophils Absolute: 0.1 10*3/uL (ref 0.0–0.1)
Basophils Relative: 1 %
Eosinophils Absolute: 0.1 10*3/uL (ref 0.0–0.5)
Eosinophils Relative: 1 %
HCT: 35.4 % — ABNORMAL LOW (ref 39.0–52.0)
Hemoglobin: 12.4 g/dL — ABNORMAL LOW (ref 13.0–17.0)
Immature Granulocytes: 0 %
Lymphocytes Relative: 39 %
Lymphs Abs: 4.2 10*3/uL — ABNORMAL HIGH (ref 0.7–4.0)
MCH: 29.4 pg (ref 26.0–34.0)
MCHC: 35 g/dL (ref 30.0–36.0)
MCV: 83.9 fL (ref 80.0–100.0)
Monocytes Absolute: 1 10*3/uL (ref 0.1–1.0)
Monocytes Relative: 9 %
Neutro Abs: 5.5 10*3/uL (ref 1.7–7.7)
Neutrophils Relative %: 50 %
Platelets: 246 10*3/uL (ref 150–400)
RBC: 4.22 MIL/uL (ref 4.22–5.81)
RDW: 13 % (ref 11.5–15.5)
WBC: 10.9 10*3/uL — ABNORMAL HIGH (ref 4.0–10.5)
nRBC: 0 % (ref 0.0–0.2)

## 2022-05-23 LAB — MAGNESIUM: Magnesium: 2.1 mg/dL (ref 1.7–2.4)

## 2022-05-23 LAB — COMPREHENSIVE METABOLIC PANEL
ALT: 21 U/L (ref 0–44)
AST: 19 U/L (ref 15–41)
Albumin: 2.7 g/dL — ABNORMAL LOW (ref 3.5–5.0)
Alkaline Phosphatase: 92 U/L (ref 38–126)
Anion gap: 10 (ref 5–15)
BUN: 19 mg/dL (ref 8–23)
CO2: 23 mmol/L (ref 22–32)
Calcium: 8.4 mg/dL — ABNORMAL LOW (ref 8.9–10.3)
Chloride: 106 mmol/L (ref 98–111)
Creatinine, Ser: 1.41 mg/dL — ABNORMAL HIGH (ref 0.61–1.24)
GFR, Estimated: 54 mL/min — ABNORMAL LOW (ref >60)
Glucose, Bld: 95 mg/dL (ref 70–99)
Potassium: 3.8 mmol/L (ref 3.5–5.1)
Sodium: 139 mmol/L (ref 135–145)
Total Bilirubin: 1.3 mg/dL — ABNORMAL HIGH (ref 0.3–1.2)
Total Protein: 5.6 g/dL — ABNORMAL LOW (ref 6.5–8.1)

## 2022-05-23 LAB — PHOSPHORUS: Phosphorus: 3.5 mg/dL (ref 2.5–4.6)

## 2022-05-23 MED ORDER — SPIRONOLACTONE 12.5 MG HALF TABLET
12.5000 mg | ORAL_TABLET | Freq: Every day | ORAL | Status: DC
  Administered 2022-05-23: 12.5 mg via ORAL
  Filled 2022-05-23: qty 1
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
## 2022-05-23 NOTE — Assessment & Plan Note (Addendum)
This is felt to have been precipitated by medication nonadherence.  Patient has been transitioned to oral metoprolol as well as Xarelto as per cardiology.  At this time rate controlled and not symptomatic.  Continue with same. Patient having sinus pause upto 4.2 seconds. Will contineu with telemetry.

## 2022-05-23 NOTE — TOC Initial Note (Signed)
Transition of Care [facility]) - Initial/Assessment Note    Patient Details  Name: Brendan Butler MRN: [id number] Date of Birth: 07-10-1950  [facility] CM/SW Contact:    Vinie Sill, LCSW Phone Number: 05/23/2022, 11:27 AM  Clinical Narrative:                  CSW met with patient at bedside CSW introduced self and explained role. CSW informed patient of recommendations for short term rehab at [facility]. Patient states he understands recommendations and is agreeable to SNF placement. CSW explained walking 300 feet, etc insurance insurance may declined. Patient reports if not approved he is agreeable to [facility]. No preferred SNF choice at this time.   TOC will provide bed offers once provided.   Thurmond Butts, MSW, LCSW Clinical Social Worker    Expected Discharge Plan: Skilled Nursing Facility Barriers to Discharge: Ship broker, SNF Pending bed offer   Patient Goals and CMS Choice            Expected Discharge Plan and Services In-house Referral: Clinical Social Work                                            Prior Living Arrangements/Services   Lives with:: Self          Need for Family Participation in Patient Care: Yes (Comment) Care giver support system in place?: Yes (comment)   Criminal Activity/Legal Involvement Pertinent to Current Situation/Hospitalization: No - Comment as needed  Activities of Daily Living Home Assistive Devices/Equipment: Cane (specify quad or straight) ADL Screening (condition at time of admission) Patient's cognitive ability adequate to safely complete daily activities?: Yes Is the patient deaf or have difficulty hearing?: No Does the patient have difficulty seeing, even when wearing glasses/contacts?: No Does the patient have difficulty concentrating, remembering, or making decisions?: Yes Patient able to express need for assistance with ADLs?: Yes Does the patient have difficulty dressing or  bathing?: No Independently performs ADLs?: Yes (appropriate for developmental age) Does the patient have difficulty walking or climbing stairs?: No Weakness of Legs: None Weakness of Arms/Hands: None  Permission Sought/Granted                  Emotional Assessment     Affect (typically observed): Accepting, Appropriate, Pleasant Orientation: : Oriented to Self, Oriented to Place, Oriented to  Time, Oriented to Situation Alcohol / Substance Use: Not Applicable Psych Involvement: No (comment)  Admission diagnosis:  Atrial fibrillation with rapid ventricular response ([place]) [I48.91] Atrial fibrillation with RVR ([place]) [I48.91] Fall, initial encounter [W19.XXXA] Closed fracture of multiple ribs of right side, initial encounter [S22.41XA] Patient Active Problem List   Diagnosis Date Noted   Atrial fibrillation with RVR ([place]) 05/20/2022   MDD (major depressive disorder), recurrent episode, mild ([place]) 05/10/2022   Delirium due to another medical condition 05/10/2022   Atrial flutter with rapid ventricular response ([place]) 05/06/2022   Hypokalemia 05/06/2022   Altered mental status 05/06/2022   Pulmonary nodules 12/27/2021   Acute cholecystitis 12/25/2021   Symptomatic bradycardia 12/25/2021   Preoperative cardiovascular examination    Intra-abdominal abscess ([place]) 01/17/2021   Lung nodule 01/17/2021   Acute-on-chronic kidney injury ([place]) 01/17/2021   Aneurysm of ascending aorta ([place]) 01/17/2021   Stage 3a chronic kidney disease (CKD) ([place]) 01/17/2021   Weight loss 01/17/2021   Bronchiectasis ([place]) 01/17/2021  Chronic cholecystitis 01/16/2021   Traumatic hematoma of left popliteal region 01/28/2016   DJD (degenerative joint disease) of pelvis 01/28/2016   Atypical chest pain    Chest pain 01/27/2016   Unstable angina (HCC) 04/21/2013   Leukocytosis 04/21/2013   Subsequent ST elevation (STEMI) myocardial infarction of anterior wall within 4 weeks of initial infarction ([place])  01/08/2013    Class: Hospitalized for   Illiteracy and low-level literacy 01/08/2013   Aspirin allergy- hives 01/06/2013   STEMI 12/29/12 Rx'd with LAD DES with early stent thrombosis, STEMI-PCI 01/05/14 01/05/2013   Coronary artery disease involving native coronary artery of native heart without angina pectoris 06/29/2007   Dyslipidemia 06/25/2007   Essential hypertension 06/25/2007   ALLERGIC RHINITIS 06/25/2007   OSA on CPAP 06/25/2007   PCP:  Wenda Low, MD Pharmacy:   [id number] [place], [address] [place] [address] Phone: [phone] Fax: [phone]  [facility] [place] [place], [place] [address] Phone: [phone] Fax: [phone]     Social Determinants of Health (SDOH) Social History: SDOH Screenings   Food Insecurity: No Food Insecurity (05/21/2022)  Housing: Low Risk  (05/21/2022)  Transportation Needs: No Transportation Needs (05/21/2022)  Utilities: Not At Risk (05/21/2022)  Tobacco Use: High Risk (05/20/2022)   SDOH Interventions:     Readmission Risk Interventions     No data to display

## 2022-05-23 NOTE — Progress Notes (Signed)
CCMD notified,patient had sinus pause for 4.20 sec,patient remains asymptomatic ,vitals checked,MD notified
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
## 2022-05-23 NOTE — TOC Progression Note (Addendum)
Transition of Care [facility]) - Progression Note    Patient Details  Name: Brendan Butler MRN: [id number] Date of Birth: 1950-08-16  Transition of Care [facility]) CM/SW [place] [place] Phone Number: 05/23/2022, 3:05 PM  Clinical Narrative:     CSW met with patient- informed of bed offers- patient chose, [place] confirmed possible bed availability tomorrow- insurance is pending. Advised will need outpatient palliative care following.   TOC will continue to follow and assist with discharge planning.  Thurmond Butts, MSW, LCSW Clinical Social Worker    Expected Discharge Plan: Skilled Nursing Facility Barriers to Discharge: Insurance Authorization, SNF Pending bed offer  Expected Discharge Plan and Services In-house Referral: Clinical Social Work                                             Social Determinants of Health (SDOH) Interventions SDOH Screenings   Food Insecurity: No Food Insecurity (05/21/2022)  Housing: Low Risk  (05/21/2022)  Transportation Needs: No Transportation Needs (05/21/2022)  Utilities: Not At Risk (05/21/2022)  Tobacco Use: High Risk (05/20/2022)    Readmission Risk Interventions     No data to display

## 2022-05-23 NOTE — Progress Notes (Signed)
Occupational Therapy Treatment Patient Details Name: Brendan Butler MRN: [id number] DOB: 12-Feb-1951 Today's Date: 05/23/2022   History of present illness Pt is a 72 y.o. male who presented from home to the ED with AMS and falls. CT revealed multi R rib fxs. He had a recent hospitalization 3/10-3/14 with similar presentation and diagnosed with a fib with RVR. PMH: HTN, hyperlipidemia, CAD, CHF, obstructive sleep apnea   OT comments  Patient continues to make steady progress towards goals in skilled OT session. Patient's session encompassed  on energy conservation strategies, potential need for adaptive equipment or DME at discharge, and increasing overall activity tolerance. Patient much improved with regard to cognition, able to compelte higher level thinking and demonstrating good recall of how daughters have set up his environment and medications for his safe discharge home. Patient provided with handout with regard to shower seat and grab bars that family can order and assemble in order to promote increased independence in home environment. All questions asked and answered, OT will continue to follow.    Recommendations for follow up therapy are one component of a multi-disciplinary discharge planning process, led by the attending physician.  Recommendations may be updated based on patient status, additional functional criteria and insurance authorization.    Assistance Recommended at Discharge Frequent or constant Supervision/Assistance  Patient can return home with the following  Assist for transportation;Assistance with cooking/housework;Direct supervision/assist for financial management;Direct supervision/assist for medications management   Equipment Recommendations  None recommended by OT    Recommendations for Other Services      Precautions / Restrictions Precautions Precautions: Fall Restrictions Weight Bearing Restrictions: No       Mobility Bed Mobility Overal bed  mobility: Needs Assistance             General bed mobility comments: declining OOB mobility despite encouragement    Transfers                   General transfer comment: declining OOB mobility despite encouragement     Balance Overall balance assessment: Needs assistance                                         ADL either performed or assessed with clinical judgement   ADL Overall ADL's : Needs assistance/impaired     Grooming: Wash/dry hands;Wash/dry face;Set up;Bed level                               Functional mobility during ADLs: Min guard General ADL Comments: Patient session focus on energy conservation strategies, potential need for adaptive equipment or DME at discharge, and increasing overall activity tolerance.    Extremity/Trunk Assessment              Vision       Perception     Praxis      Cognition Arousal/Alertness: Awake/alert Behavior During Therapy: WFL for tasks assessed/performed Overall Cognitive Status: Within Functional Limits for tasks assessed                                 General Comments: Patient much improved with regard to cognition, able to compelte higher level thinking and demonstrating good recall of how daughters have set up his environment and medications for his safe  discharge home.        Exercises      Shoulder Instructions       General Comments      Pertinent Vitals/ Pain       Pain Assessment Pain Assessment: No/denies pain  Home Living                                          Prior Functioning/Environment              Frequency  Min 2X/week        Progress Toward Goals  OT Goals(current goals can now be found in the care plan section)  Progress towards OT goals: Progressing toward goals  Acute Rehab OT Goals Patient Stated Goal: to go home OT Goal Formulation: With patient Time For Goal Achievement:  06/04/22 Potential to Achieve Goals: Good  Plan Discharge plan remains appropriate    Co-evaluation                 AM-PAC OT "6 Clicks" Daily Activity     Outcome Measure   Help from another person eating meals?: None Help from another person taking care of personal grooming?: A Little Help from another person toileting, which includes using toliet, bedpan, or urinal?: A Little Help from another person bathing (including washing, rinsing, drying)?: A Little Help from another person to put on and taking off regular upper body clothing?: None Help from another person to put on and taking off regular lower body clothing?: A Little 6 Click Score: 20    End of Session    OT Visit Diagnosis: Unsteadiness on feet (R26.81);Other symptoms and signs involving cognitive function   Activity Tolerance Patient tolerated treatment well   Patient Left in bed;with call bell/phone within reach;with bed alarm set   Nurse Communication Mobility status        Time: [id number] OT Time Calculation (min): 14 min  Charges: OT General Charges $OT Visit: 1 Visit OT Treatments $Self Care/Home Management : 8-22 mins  Corinne Ports E. Dajia Gunnels, OTR/L Acute Rehabilitation Services [phone]   Ascencion Dike 05/23/2022, 12:08 PM

## 2022-05-23 NOTE — Progress Notes (Addendum)
Progress Note   Patient: Brendan Butler [id number] DOB: 23-May-1950 DOA: 05/20/2022     2 DOS: the patient was seen and examined on 05/23/2022   Brief hospital course: No notes on file  Assessment and Plan: * Atrial fibrillation with RVR ([place]) This is felt to have been precipitated by medication nonadherence.  Patient has been transitioned to oral metoprolol as well as Xarelto as per cardiology.  At this time rate controlled and not symptomatic.  Continue with same. Patient having sinus pause upto 4.2 seconds. Will contineu with telemetry.  Falls frequently Frequent falls as well as concern for patient not being able to take medications unsupervised.  At this time family is deciding on further care plan for patient including possible placement to SNF.  Will proceed accordingly.  Patient has been started on anticoagulation at this time with plan to monitor how interventions related to placement unable to avoid falls in the future.  Delirium due to another medical condition This has largely resolved, appreciate neurology evaluation.  Plan for continued clinical monitoring as an outpatient and outpatient follow-up with neurology.  EEG is noted, no focal findings  Congestive heart failure: Continue with chronic medications as per cardiology      Subjective: Patient has no ongoing chest pain or palpitation.  Patient has been calm and tolerating diet.  Physical Exam: Vitals:   05/23/22 0527 05/23/22 0713 05/23/22 0801 05/23/22 1240  BP: 105/77  (!) 119/101 100/69  Pulse:    79  Resp: 11  12 20   Temp: 97.7 F (36.5 C)  97.7 F (36.5 C) 97.6 F (36.4 C)  TempSrc: Oral  Oral Oral  SpO2: 98%   92%  Weight:  90.2 kg    Height:       General: Alert awake, speaking coherently with family.  No distress Respiratory exam: On room air Extremities without obvious edema Data Reviewed:  Labs on Admission:  Results for orders placed or performed during the hospital encounter of 05/20/22  (from the past 24 hour(s))  CBC with Differential/Platelet     Status: Abnormal   Collection Time: 05/23/22  1:39 AM  Result Value Ref Range   WBC 10.9 (H) 4.0 - 10.5 K/uL   RBC 4.22 4.22 - 5.81 MIL/uL   Hemoglobin 12.4 (L) 13.0 - 17.0 g/dL   HCT 35.4 (L) 39.0 - 52.0 %   MCV 83.9 80.0 - 100.0 fL   MCH 29.4 26.0 - 34.0 pg   MCHC 35.0 30.0 - 36.0 g/dL   RDW 13.0 11.5 - 15.5 %   Platelets 246 150 - 400 K/uL   nRBC 0.0 0.0 - 0.2 %   Neutrophils Relative % 50 %   Neutro Abs 5.5 1.7 - 7.7 K/uL   Lymphocytes Relative 39 %   Lymphs Abs 4.2 (H) 0.7 - 4.0 K/uL   Monocytes Relative 9 %   Monocytes Absolute 1.0 0.1 - 1.0 K/uL   Eosinophils Relative 1 %   Eosinophils Absolute 0.1 0.0 - 0.5 K/uL   Basophils Relative 1 %   Basophils Absolute 0.1 0.0 - 0.1 K/uL   Immature Granulocytes 0 %   Abs Immature Granulocytes 0.02 0.00 - 0.07 K/uL  Comprehensive metabolic panel     Status: Abnormal   Collection Time: 05/23/22  1:39 AM  Result Value Ref Range   Sodium 139 135 - 145 mmol/L   Potassium 3.8 3.5 - 5.1 mmol/L   Chloride 106 98 - 111 mmol/L   CO2 23 22 -  32 mmol/L   Glucose, Bld 95 70 - 99 mg/dL   BUN 19 8 - 23 mg/dL   Creatinine, Ser 1.41 (H) 0.61 - 1.24 mg/dL   Calcium 8.4 (L) 8.9 - 10.3 mg/dL   Total Protein 5.6 (L) 6.5 - 8.1 g/dL   Albumin 2.7 (L) 3.5 - 5.0 g/dL   AST 19 15 - 41 U/L   ALT 21 0 - 44 U/L   Alkaline Phosphatase 92 38 - 126 U/L   Total Bilirubin 1.3 (H) 0.3 - 1.2 mg/dL   GFR, Estimated 54 (L) >60 mL/min   Anion gap 10 5 - 15  Phosphorus     Status: None   Collection Time: 05/23/22  1:39 AM  Result Value Ref Range   Phosphorus 3.5 2.5 - 4.6 mg/dL  Magnesium     Status: None   Collection Time: 05/23/22  1:39 AM  Result Value Ref Range   Magnesium 2.1 1.7 - 2.4 mg/dL   Basic Metabolic Panel: Recent Labs  Lab 05/20/22 1532 05/21/22 0113 05/22/22 0118 05/23/22 0139  NA 141 137 139 139  K 3.7 3.6 3.7 3.8  CL 105 107 104 106  CO2 26 22 25 23   GLUCOSE 125*  112* 93 95  BUN 14 15 21 19   CREATININE 1.26* 1.17 1.39* 1.41*  CALCIUM 8.8* 8.5* 8.5* 8.4*  MG  --  1.8 2.2 2.1  PHOS  --  3.7 3.4 3.5   Liver Function Tests: Recent Labs  Lab 05/20/22 1532 05/21/22 0113 05/22/22 0118 05/23/22 0139  AST 18 19 17 19   ALT 19 20 20 21   ALKPHOS 84 73 83 92  BILITOT 1.4* 1.3* 1.2 1.3*  PROT 6.1* 5.8* 5.6* 5.6*  ALBUMIN 2.7* 2.7* 2.7* 2.7*   No results for input(s): "LIPASE", "AMYLASE" in the last 168 hours. No results for input(s): "AMMONIA" in the last 168 hours. CBC: Recent Labs  Lab 05/20/22 1532 05/21/22 0113 05/22/22 0118 05/23/22 0139  WBC 9.1 9.8 12.0* 10.9*  NEUTROABS  --   --  6.8 5.5  HGB 12.8* 13.0 12.4* 12.4*  HCT 38.9* 38.8* 36.4* 35.4*  MCV 86.6 86.2 85.2 83.9  PLT 241 242 244 246   Cardiac Enzymes: Recent Labs  Lab 05/20/22 1532 05/20/22 1820  TROPONINIHS 14 12    BNP (last 3 results) No results for input(s): "PROBNP" in the last 8760 hours. CBG: Recent Labs  Lab 05/21/22 0628  GLUCAP 109*    Radiological Exams on Admission:  DG CHEST PORT 1 VIEW  Result Date: 05/23/2022 CLINICAL DATA:  Shortness of breath. EXAM: PORTABLE CHEST 1 VIEW COMPARISON:  05/20/2022 FINDINGS: The lungs are clear without focal pneumonia, edema, pneumothorax or pleural effusion. The cardiopericardial silhouette is within normal limits for size. The visualized bony structures of the thorax are unremarkable. IMPRESSION: No active disease. Electronically Signed   By: Misty Stanley M.D.   On: 05/23/2022 07:51   Overnight EEG with video  Result Date: 05/22/2022 Lora Havens, MD     05/23/2022  9:44 AM Patient Name: Brendan Butler MRN: [id number] Epilepsy Attending: Lora Havens Referring Physician/Provider: Kerney Elbe, DO Duration: 05/21/2022 2108 to 05/22/2022 2108  Patient history: 72yo M with ams. EEG to evaluate for seizure  Level of alertness: Awake, asleep  AEDs during EEG study: Pregabalin  Technical aspects: This EEG  study was done with scalp electrodes positioned according to the 10-20 International system of electrode placement. Electrical activity was reviewed with band pass filter  of 1-70Hz , sensitivity of 7 uV/mm, display speed of 60mm/sec with a 60Hz  notched filter applied as appropriate. EEG data were recorded continuously and digitally stored.  Video monitoring was available and reviewed as appropriate.  Description: The posterior dominant rhythm consists of 7 Hz activity of moderate voltage (25-35 uV) seen predominantly in posterior head regions, symmetric and reactive to eye opening and eye closing. Sleep was characterized by vertex waves, sleep spindles (12-14hz ), maximal fronto-central region. . EEG showed continuous generalized 6-7hz  theta slowing.  ABNORMALITY - Background slow, generalized - Continuous slow, generalized  IMPRESSION: This study is suggestive of mild diffuse encephalopathy, nonspecific etiology. No seizures or epileptiform discharges were seen throughout the recording.  Lora Havens   EEG adult  Result Date: 05/22/2022 Lora Havens, MD     05/22/2022  8:54 AM Patient Name: Brendan Butler MRN: [id number] Epilepsy Attending: Lora Havens Referring Physician/Provider: Thomasene Ripple, MD Date: 05/21/2022 Duration: 26.52 mins Patient history: 72yo M with ams. EEG to evaluate for seizure Level of alertness: Awake, drowsy AEDs during EEG study: Pregabalin Technical aspects: This EEG study was done with scalp electrodes positioned according to the 10-20 International system of electrode placement. Electrical activity was reviewed with band pass filter of 1-70Hz , sensitivity of 7 uV/mm, display speed of 68mm/sec with a 60Hz  notched filter applied as appropriate. EEG data were recorded continuously and digitally stored.  Video monitoring was available and reviewed as appropriate. Description: The posterior dominant rhythm consists of 7 Hz activity of moderate voltage (25-35 uV) seen  predominantly in posterior head regions, symmetric and reactive to eye opening and eye closing. Drowsiness was characterized by attenuation of the posterior background rhythm. EEG showed continuous generalized 6-7hz  theta slowing. Physiologic photic driving was not seen during photic stimulation.  Hyperventilation was not performed.   ABNORMALITY - Background slow, generalized - Continuous slow, generalized IMPRESSION: This study is suggestive of mild diffuse encephalopathy, nonspecific etiology. No seizures or epileptiform discharges were seen throughout the recording. Lora Havens       Family Communication: Family currently at bedside with patient, palliative care evaluation ongoing  Disposition: Status is: Inpatient   Planned Discharge Destination: Skilled nursing facility    Time spent: 20 minutes  Author: Gertie Fey, MD 05/23/2022 1:11 PM  For on call review [email or web address].

## 2022-05-23 NOTE — Consult Note (Signed)
Consultation Note Date: 05/23/2022   Patient Name: Brendan Butler  DOB: 05-27-1950  MRN: [id number]  Age / Sex: 72 y.o., male  PCP: Wenda Low, MD Referring Physician: Gertie Fey, MD  Reason for Consultation: Establishing goals of care  HPI/Patient Profile: 72 y.o. male  with past medical history of hypertension, hyperlipidemia, CAD, CHF, and obstructive sleep apnea  admitted on 05/20/2022 with AMS and falls.  Patient recently discharged following hospitalization on 3/14.  This admission patient being treated for A-fib/flutter with RVR.  Diltiazem drip was stopped due to low EF.  Patient has had extensive workup for altered mental status.  Concerned about progressive cognitive decline.  PMT consulted to discuss goals of care.  Clinical Assessment and Goals of Care: I have reviewed medical records including EPIC notes, labs and imaging, assessed the patient and then met with patient and daughters to discuss diagnosis prognosis, GOC, EOL wishes, disposition and options.  I introduced Palliative Medicine as specialized medical care for people living with serious illness. It focuses on providing relief from the symptoms and stress of a serious illness. The goal is to improve quality of life for both the patient and the family.  Patient living independently at baseline.  Patient has experienced multiple falls.  Daughters note that patient has had significant decline in past 3 weeks spending most of his time asleep and not eating.  Patient has had some confusion at baseline.   We discussed patient's current illness and what it means in the larger context of patient's on-going co-morbidities.  We discussed patient's cardiac disease.  We discussed his multiple hospitalizations.  I attempted to elicit values and goals of care important to the patient.    Advance directives, concepts specific to code status, artificial feeding and hydration, and  rehospitalization were considered and discussed.   I completed a MOST form today. The patient and family outlined their wishes for the following treatment decisions:  Cardiopulmonary Resuscitation: Attempt Resuscitation (CPR)  Medical Interventions: Full Scope of Treatment: Use intubation, advanced airway interventions, mechanical ventilation, cardioversion as indicated, medical treatment, IV fluids, etc, also provide comfort measures. Transfer to the hospital if indicated  Antibiotics: Antibiotics if indicated  IV Fluids: IV fluids if indicated  Feeding Tube: Feeding tube for a defined trial period   Most placed on chart and copy given to patient.  A living will was also introduced to patient however he is not ready to completed at this time.  He request time to consider.  Discussed with patient and family the importance of continued conversation with family and the medical providers regarding overall plan of care and treatment options, ensuring decisions are within the context of the patients values and GOCs.    Palliative Care services outpatient were explained and offered.  He is interested in palliative support outpatient.  Questions and concerns were addressed. The family was encouraged to call with questions or concerns.  Primary Decision Maker PATIENT -majority of adult children if patient unable    SUMMARY OF RECOMMENDATIONS   -MOST completed as above: Maintain full code/full scope -Referral to outpatient palliative support -Family interested in discussing home support options TOC team  Code Status/Advance Care Planning: Full code     Primary Diagnoses: Present on Admission:  Atrial fibrillation with RVR ([place])   I have reviewed the medical record, interviewed the patient and family, and examined the patient. The following aspects are pertinent.  Past Medical History:  Diagnosis Date   Acute myocardial infarction of other anterior  wall, initial episode of care  12/29/2012   Promus stent to LAD   Acute ST segment elevation MI ([place]) 01/05/2013   secondary to thrombus in stent; Brilintta changed to Effient pt with ASA allergy   Coronary artery disease 2001   stent to LAD and patent 2007 on cath   Echocardiogram abnormal 01/06/2013   EF [id number], grade I diastolic dysfunction   Erectile dysfunction    Hyperlipidemia LDL goal <70    Hypertension    OSA (obstructive sleep apnea)    No CPAP use overnight   Social History   Socioeconomic History   Marital status: Divorced    Spouse name: Not on file   Number of children: Not on file   Years of education: Not on file   Highest education level: Not on file  Occupational History   Not on file  Tobacco Use   Smoking status: Former    Packs/day: 2.00    Years: 25.00    Additional pack years: 0.00    Total pack years: 50.00    Types: Cigarettes    Quit date: 2001    Years since quitting: 23.2   Smokeless tobacco: Current    Types: Chew  Vaping Use   Vaping Use: Never used  Substance and Sexual Activity   Alcohol use: Not Currently    Comment: 2001 stopped drinking   Drug use: No   Sexual activity: Not on file  Other Topics Concern   Not on file  Social History Narrative   Not on file   Social Determinants of Health   Financial Resource Strain: Not on file  Food Insecurity: No Food Insecurity (05/21/2022)   Hunger Vital Sign    Worried About Running Out of Food in the Last Year: Never true    Ran Out of Food in the Last Year: Never true  Transportation Needs: No Transportation Needs (05/21/2022)   PRAPARE - Hydrologist (Medical): No    Lack of Transportation (Non-Medical): No  Physical Activity: Not on file  Stress: Not on file  Social Connections: Not on file   Family History  Problem Relation Age of Onset   Alzheimer's disease Mother    CAD Father    Scheduled Meds:  buPROPion  300 mg Oral Daily   clopidogrel  75 mg Oral Daily   empagliflozin   10 mg Oral Daily   metoprolol succinate  100 mg Oral Daily   pantoprazole  40 mg Oral Daily   PARoxetine  30 mg Oral Daily   pregabalin  150 mg Oral TID   QUEtiapine  25 mg Oral QHS   rivaroxaban  20 mg Oral Q supper   rosuvastatin  10 mg Oral Daily   sacubitril-valsartan  1 tablet Oral BID   spironolactone  12.5 mg Oral Daily   Continuous Infusions: PRN Meds:.acetaminophen, ondansetron (ZOFRAN) IV Allergies  Allergen Reactions   Levitra [Vardenafil] Other (See Comments)    Blurred vision   Aspirin Hives   Lipitor [Atorvastatin] Other (See Comments)    Muscle cramps; patient states he does not take medication at home   Review of Systems  Constitutional:  Positive for activity change and appetite change.  Neurological:  Positive for weakness.    Physical Exam Constitutional:      General: He is not in acute distress. Cardiovascular:     Rate and Rhythm: Normal rate.  Pulmonary:     Effort: Pulmonary effort is normal.  Skin:  General: Skin is warm and dry.  Neurological:     Mental Status: He is alert and oriented to person, place, and time.     Comments: Periods of confusion but easily redirected  Psychiatric:        Mood and Affect: Mood normal.     Vital Signs: BP (!) 119/101 (BP Location: Right Arm)   Pulse 72   Temp 97.7 F (36.5 C) (Oral)   Resp 12   Ht 5\' 10"  (1.778 m)   Wt 90.2 kg   SpO2 98%   BMI 28.53 kg/m  Pain Scale: 0-10   Pain Score: 0-No pain   SpO2: SpO2: 98 % O2 Device:SpO2: 98 % O2 Flow Rate: .O2 Flow Rate (L/min): 2 L/min  IO: Intake/output summary:  Intake/Output Summary (Last 24 hours) at 05/23/2022 1225 Last data filed at 05/23/2022 0848 Gross per 24 hour  Intake 240 ml  Output 1050 ml  Net -810 ml    LBM:   Baseline Weight: Weight: 93.6 kg Most recent weight: Weight: 90.2 kg     Palliative Assessment/Data: PPS 50%     *Please note that this is a verbal dictation therefore any spelling or grammatical errors are due to  the "[employer]" system interpretation.   Juel Burrow, DNP, AGNP-C Palliative Medicine Team [phone] Pager: [phone]

## 2022-05-23 NOTE — Progress Notes (Addendum)
Rounding Note    Patient Name: Brendan Butler Date of Encounter: 05/23/2022  [place] Cardiologist: [name], MD   Subjective   No acute overnight events. He is still pleasantly confused. He is alert to persona and place. He was able to tell me the year but did not know the month. When asked what brought him into the hospital, he reported chest pain. However, he denied any chest pain at the time of our initial consult. Per chart review, it was altered mental status and falls that brought him in. However, he was found to have multiple rib fractures.  He denies any chest pain now. No shortness of breath, palpitations, lightheadedness, or dizziness. He remains in atrial flutter but is rate controlled (rates in the 70s).  Inpatient Medications    Scheduled Meds:  buPROPion  300 mg Oral Daily   clopidogrel  75 mg Oral Daily   empagliflozin  10 mg Oral Daily   metoprolol succinate  100 mg Oral Daily   pantoprazole  40 mg Oral Daily   PARoxetine  30 mg Oral Daily   pregabalin  150 mg Oral TID   QUEtiapine  25 mg Oral QHS   rivaroxaban  20 mg Oral Q supper   rosuvastatin  10 mg Oral Daily   sacubitril-valsartan  1 tablet Oral BID   spironolactone  12.5 mg Oral Daily   Continuous Infusions:  PRN Meds: acetaminophen, ondansetron (ZOFRAN) IV   Vital Signs    Vitals:   05/23/22 0143 05/23/22 0527 05/23/22 0713 05/23/22 0801  BP: (!) 126/55 105/77  (!) 119/101  Pulse: 72     Resp: 16 11  12   Temp: 97.9 F (36.6 C) 97.7 F (36.5 C)  97.7 F (36.5 C)  TempSrc: Oral Oral  Oral  SpO2: 98% 98%    Weight:   90.2 kg   Height:        Intake/Output Summary (Last 24 hours) at 05/23/2022 1025 Last data filed at 05/23/2022 0848 Gross per 24 hour  Intake 240 ml  Output 1050 ml  Net -810 ml      05/23/2022    7:13 AM 05/22/2022    6:50 AM 05/21/2022    7:04 AM  Last 3 Weights  Weight (lbs) 198 lb 13.7 oz 199 lb 1.2 oz 190 lb 8 oz  Weight (kg) 90.2 kg 90.3 kg  86.41 kg      Telemetry    Atrial flutter  - Personally Reviewed  ECG    No new ECG tracing today. - Personally Reviewed  Physical Exam   GEN: Caucasian male resting comfortably in no acute distress. Pleasantly confused.   Neck: No JVD. Cardiac: Irregular rhythm with normal rate. No murmurs, rubs, or gallops.  Respiratory: Clear to auscultation bilaterally. No wheezes, rhonchi, or rales. GI: Soft, non-distended, and non-tender.  MS: No lower extremity edema. No deformity. Skin: Warm and dry. Neuro:  Alert and oriented to person, place, and year. No focal deficits.  Psych: Normal affect. Pleasantly confused.  Labs    High Sensitivity Troponin:   Recent Labs  Lab 05/06/22 1850 05/06/22 2110 05/20/22 1532 05/20/22 1820  TROPONINIHS 28* 28* 14 12     Chemistry Recent Labs  Lab 05/21/22 0113 05/22/22 0118 05/23/22 0139  NA 137 139 139  K 3.6 3.7 3.8  CL 107 104 106  CO2 22 25 23   GLUCOSE 112* 93 95  BUN 15 21 19   CREATININE 1.17 1.39* 1.41*  CALCIUM  8.5* 8.5* 8.4*  MG 1.8 2.2 2.1  PROT 5.8* 5.6* 5.6*  ALBUMIN 2.7* 2.7* 2.7*  AST 19 17 19   ALT 20 20 21   ALKPHOS 73 83 92  BILITOT 1.3* 1.2 1.3*  GFRNONAA >60 55* 54*  ANIONGAP 8 10 10     Lipids No results for input(s): "CHOL", "TRIG", "HDL", "LABVLDL", "LDLCALC", "CHOLHDL" in the last 168 hours.  Hematology Recent Labs  Lab 05/21/22 0113 05/22/22 0118 05/23/22 0139  WBC 9.8 12.0* 10.9*  RBC 4.50 4.27 4.22  HGB 13.0 12.4* 12.4*  HCT 38.8* 36.4* 35.4*  MCV 86.2 85.2 83.9  MCH 28.9 29.0 29.4  MCHC 33.5 34.1 35.0  RDW 13.0 13.2 13.0  PLT 242 244 246   Thyroid No results for input(s): "TSH", "FREET4" in the last 168 hours.  BNPNo results for input(s): "BNP", "PROBNP" in the last 168 hours.  DDimer No results for input(s): "DDIMER" in the last 168 hours.   Radiology    DG CHEST PORT 1 VIEW  Result Date: 05/23/2022 CLINICAL DATA:  Shortness of breath. EXAM: PORTABLE CHEST 1 VIEW COMPARISON:   05/20/2022 FINDINGS: The lungs are clear without focal pneumonia, edema, pneumothorax or pleural effusion. The cardiopericardial silhouette is within normal limits for size. The visualized bony structures of the thorax are unremarkable. IMPRESSION: No active disease. Electronically Signed   By: Misty Stanley M.D.   On: 05/23/2022 07:51   Overnight EEG with video  Result Date: 05/22/2022 Lora Havens, MD     05/23/2022  9:44 AM Patient Name: Brendan Butler MRN: [id number] Epilepsy Attending: Lora Havens Referring Physician/Provider: Kerney Elbe, DO Duration: 05/21/2022 2108 to 05/22/2022 2108  Patient history: 72yo M with ams. EEG to evaluate for seizure  Level of alertness: Awake, asleep  AEDs during EEG study: Pregabalin  Technical aspects: This EEG study was done with scalp electrodes positioned according to the 10-20 International system of electrode placement. Electrical activity was reviewed with band pass filter of 1-70Hz , sensitivity of 7 uV/mm, display speed of 20mm/sec with a 60Hz  notched filter applied as appropriate. EEG data were recorded continuously and digitally stored.  Video monitoring was available and reviewed as appropriate.  Description: The posterior dominant rhythm consists of 7 Hz activity of moderate voltage (25-35 uV) seen predominantly in posterior head regions, symmetric and reactive to eye opening and eye closing. Sleep was characterized by vertex waves, sleep spindles (12-14hz ), maximal fronto-central region. . EEG showed continuous generalized 6-7hz  theta slowing.  ABNORMALITY - Background slow, generalized - Continuous slow, generalized  IMPRESSION: This study is suggestive of mild diffuse encephalopathy, nonspecific etiology. No seizures or epileptiform discharges were seen throughout the recording.  Lora Havens   EEG adult  Result Date: 05/22/2022 Lora Havens, MD     05/22/2022  8:54 AM Patient Name: Brendan Butler MRN: [id number] Epilepsy  Attending: Lora Havens Referring Physician/Provider: Thomasene Ripple, MD Date: 05/21/2022 Duration: 26.52 mins Patient history: 72yo M with ams. EEG to evaluate for seizure Level of alertness: Awake, drowsy AEDs during EEG study: Pregabalin Technical aspects: This EEG study was done with scalp electrodes positioned according to the 10-20 International system of electrode placement. Electrical activity was reviewed with band pass filter of 1-70Hz , sensitivity of 7 uV/mm, display speed of 87mm/sec with a 60Hz  notched filter applied as appropriate. EEG data were recorded continuously and digitally stored.  Video monitoring was available and reviewed as appropriate. Description: The posterior dominant rhythm consists of 7 Hz activity of  moderate voltage (25-35 uV) seen predominantly in posterior head regions, symmetric and reactive to eye opening and eye closing. Drowsiness was characterized by attenuation of the posterior background rhythm. EEG showed continuous generalized 6-7hz  theta slowing. Physiologic photic driving was not seen during photic stimulation.  Hyperventilation was not performed.   ABNORMALITY - Background slow, generalized - Continuous slow, generalized IMPRESSION: This study is suggestive of mild diffuse encephalopathy, nonspecific etiology. No seizures or epileptiform discharges were seen throughout the recording. Lora Havens    Cardiac Studies   TTE 12/26/2021: Impressions: 1. Left ventricular ejection fraction, by estimation, is 55 to 60%. The  left ventricle has normal function. The left ventricle demonstrates  regional wall motion abnormalities (suggestive of prior anteroseptal/LAD  infarct). The left ventricular internal  cavity size was mildly dilated. Left ventricular diastolic parameters are  consistent with Grade I diastolic dysfunction (impaired relaxation).   2. Right ventricular systolic function is normal. The right ventricular  size is normal. Tricuspid  regurgitation signal is inadequate for assessing  PA pressure.   3. The mitral valve is grossly normal. No evidence of mitral valve  regurgitation. No evidence of mitral stenosis.   4. The aortic valve is tricuspid. Aortic valve regurgitation is not  visualized. No aortic stenosis is present.   Comparison(s): LVEF has improve from prior reporting.  _______________  TEE/ DCCV 05/10/2022: 1. Successful TEE/DCCV with return to NSR. No LAA thrombus. LVEF 20-25%.  Global HK.   2. Left ventricular ejection fraction, by estimation, is 20 to 25%. The  left ventricle has severely decreased function.   3. Right ventricular systolic function is mildly reduced. The right  ventricular size is normal.   4. Left atrial size was moderately dilated. No left atrial/left atrial  appendage thrombus was detected. The LAA emptying velocity was 39 cm/s.   5. Right atrial size was mildly dilated.   6. The mitral valve is grossly normal. Mild mitral valve regurgitation.  No evidence of mitral stenosis.   7. The aortic valve is tricuspid. Aortic valve regurgitation is not  visualized. No aortic stenosis is present.   8. There is Moderate (Grade III) layered plaque involving the descending  aorta.    Patient Profile     72 y.o. 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 He was recently admitted on 05/06/2022 for altered mental status and incidentally found to be in new onset rapid atrial flutter. He underwent successful TEE/ DCCV at that time but left AMA before medications could be optimized. He was readmitted on 05/20/2022 for worsening altered mental status and was found to be back in atrial flutter with RVR. Cardiology consulted for further evaluation.   Assessment & Plan    Persistent Atrial Flutter  with RVR Patient was recently admitted earlier this month for altered mental status and incidentally found to have new onset atrial flutter with RVR. He underwent successful TEE/ DCCV with restoration of sinus rhythm. TEE showed EF had dropped from 55-60% in 11/2021 to 20-25%. He left AMA before medications could be optimized. He re-presented on 05/20/2022 with worsening mental status and was back in atrial flutter with RVR. Rates were initially in the 130s.  He was initially started on IV Diltiazem with improvement in rates. IV Diltaizem was switched to IV Amiodarone given reduced EF but Amiodarone has  now been discontinued as well given concern about long-term Amiodarone use in setting of medication non-compliance. - Still in atrial flutter with rates in the 70s. Telemetry showed one 2.85 second pause but patient asymptomatic with this. - Lopressor switched to Toprol-XL 100mg  daily today given reduced EF.  - Previously on Eliquis but has been switched to Xarelto 20mg  daily. - We have discussed concern with anticoagulation in this patient wight altered mental status, failure to thrive, medication non-compliance and falls. There is no plans for repeat cardioversion unless he can demonstrate adherence to anticoagulation. Dr. Margaretann Loveless had a long discussion with patient and family about how to be successful with medication adherence. We had considered SNF placement for the benefit of supervised medication administration but family was still deciding.   CAD He has a history of single-vessel CAD involving the LAD s/p multiple PCIs dating back to 2001.  Last PCI was in angioplasty for in-stent restenosis of LAD in 2015.  High-sensitivity troponin minimally elevated and flat at 28 >> 28 consistent with demand ischemia in the setting of rapid atrial flutter. - No chest pain.  - Continue Plavix 75mg  daily. Allergic to Aspirin - causes hives. - Continue high intensity statin.   Chronic CHmrEF Ischemic  Cardiomyopathy EF 35-40% in 12/2012 in the setting of an anterior MI.  However, EF later normalized.  TTE 11/2021 showed LVEF of 55-60% with hypokinesis of the entire anterior septum (suggestive of prior anteroseptal/LAD infarct) grade 1 diastolic dysfunction. However, recent TEE earlier during admission earlier this month showed new drop in EF to 20-25%. This was felt to likely be tachy-mediated in setting of rapid atrial flutter. Weight down from 206 lbs on admission to 198 lbs today. - Euvolemic on exam. - Continue Entresto 24-26mg  twice daily.  - Continue Toprol-XL 100mg  daily.  - Continue Jardiance 10mg  daily.  - Will start Spironolactone 12.5mg  daily. - Continue to monitor daily weights, strict I/Os, and renal function.   Hypertension BP mostly well controlled.  - Continue medications for CHF as above.   Hyperlipidemia Last LDL from 2015 was 61. Goal <55 given multiple MIs. - Continue home Crestor 10mg  daily. - Will check fasting lipid panel in the morning. If LDL above goal, would recommend increasing Crestor.    CKD Stage III Baseline creatinine seems to be around 1.3 to 1.4.  - Creatinine stable at 1.41 today.    Otherwise, per primary team: - Altered mental status: Neurology consulted and recommended outpatient neurocognitive evaluation  - Chronic leukocytosis - Pulmonary nodules - Falls with multiple rib fractures - Hypoalbuminemia   For questions or updates, please contact [place] Please consult [email or web address] for contact info under        Signed, Darreld Mclean, PA-C  05/23/2022, 10:25 AM    Patient seen and examined with Sande Rives, PA-C.  Agree as above, with the following exceptions and changes as noted below.  Patient is feeling well and tolerated medication changes without event.. Gen: NAD, CV: RRR, no murmurs, Lungs: clear, Abd: soft, Extrem: Warm, well perfused, no edema, Neuro/Psych: alert and oriented x 3, normal mood and affect. All  available labs, radiology testing, previous records reviewed.  For ease of dosing will transition to Xarelto and metoprolol succinate.  He has done well with this so far.  Rate is well-controlled, would recommend follow-up with cardiology in 3 to 4 weeks to ensure compliance with anticoagulation, if so can consider cardioversion.   Cardiology will arrange f/u and sign off.  Nadean Corwin  Stann Mainland, MD 05/23/22 11:36 AM

## 2022-05-23 NOTE — Significant Event (Signed)
Received report from RN patient had a pause of 4.2 seconds in ventricular activity.  I reviewed rhythm strip that is printed in the physical chart and filed.  This seems to be known sinus pause from atrial fibrillation.  Generally pauses of up to 5 seconds are treated conservatively in cases of atrial fibrillation.  I will therefore continue to monitor patient.  Please note patient was asymptomatic from this pause.

## 2022-05-23 NOTE — Assessment & Plan Note (Signed)
Frequent falls as well as concern for patient not being able to take medications unsupervised.  At this time family is deciding on further care plan for patient including possible placement to SNF.  Will proceed accordingly.  Patient has been started on anticoagulation at this time with plan to monitor how interventions related to placement unable to avoid falls in the future.

## 2022-05-23 NOTE — Assessment & Plan Note (Signed)
This has largely resolved, appreciate neurology evaluation.  Plan for continued clinical monitoring as an outpatient and outpatient follow-up with neurology.  EEG is noted, no focal findings

## 2022-05-24 ENCOUNTER — Inpatient Hospital Stay (HOSPITAL_BASED_OUTPATIENT_CLINIC_OR_DEPARTMENT_OTHER)
Admit: 2022-05-24 | Discharge: 2022-05-24 | Disposition: A | Discharge status: Home / Self Care | Payer: Medicare HMO | Attending: Student | Admitting: Student

## 2022-05-24 ENCOUNTER — Telehealth: Payer: Self-pay | Admitting: Student

## 2022-05-24 DIAGNOSIS — I4892 Unspecified atrial flutter: Secondary | ICD-10-CM

## 2022-05-24 DIAGNOSIS — Z515 Encounter for palliative care: Secondary | ICD-10-CM | POA: Diagnosis not present

## 2022-05-24 DIAGNOSIS — R296 Repeated falls: Secondary | ICD-10-CM

## 2022-05-24 DIAGNOSIS — Z7189 Other specified counseling: Secondary | ICD-10-CM

## 2022-05-24 DIAGNOSIS — F05 Delirium due to known physiological condition: Secondary | ICD-10-CM

## 2022-05-24 DIAGNOSIS — I4891 Unspecified atrial fibrillation: Secondary | ICD-10-CM | POA: Diagnosis not present

## 2022-05-24 LAB — BASIC METABOLIC PANEL
Anion gap: 6 (ref 5–15)
BUN: 23 mg/dL (ref 8–23)
CO2: 28 mmol/L (ref 22–32)
Calcium: 8.7 mg/dL — ABNORMAL LOW (ref 8.9–10.3)
Chloride: 103 mmol/L (ref 98–111)
Creatinine, Ser: 1.51 mg/dL — ABNORMAL HIGH (ref 0.61–1.24)
GFR, Estimated: 50 mL/min — ABNORMAL LOW (ref >60)
Glucose, Bld: 86 mg/dL (ref 70–99)
Potassium: 5.7 mmol/L — ABNORMAL HIGH (ref 3.5–5.1)
Sodium: 137 mmol/L (ref 135–145)

## 2022-05-24 LAB — FOLATE: Folate: 20.3 ng/mL (ref >5.9)

## 2022-05-24 LAB — LIPID PANEL
Cholesterol: 139 mg/dL (ref 0–200)
HDL: 27 mg/dL — ABNORMAL LOW (ref >40)
LDL Cholesterol: 76 mg/dL (ref 0–99)
Total CHOL/HDL Ratio: 5.1 RATIO
Triglycerides: 182 mg/dL — ABNORMAL HIGH (ref <150)
VLDL: 36 mg/dL (ref 0–40)

## 2022-05-24 LAB — POTASSIUM: Potassium: 4 mmol/L (ref 3.5–5.1)

## 2022-05-24 MED ORDER — ROSUVASTATIN CALCIUM 20 MG PO TABS: 20.0000 mg | ORAL_TABLET | Freq: Every day | ORAL | Status: DC

## 2022-05-24 MED ORDER — SPIRONOLACTONE 12.5 MG HALF TABLET
12.5000 mg | ORAL_TABLET | Freq: Every day | ORAL | Status: DC
  Administered 2022-05-24: 12.5 mg via ORAL
  Filled 2022-05-24: qty 1

## 2022-05-24 MED ORDER — SACUBITRIL-VALSARTAN 24-26 MG PO TABS: 1.0000 | ORAL_TABLET | Freq: Two times a day (BID) | ORAL | Status: DC

## 2022-05-24 MED ORDER — METOPROLOL SUCCINATE ER 50 MG PO TB24
75.0000 mg | ORAL_TABLET | Freq: Every day | ORAL | Status: DC
  Administered 2022-05-24: 75 mg via ORAL
  Filled 2022-05-24: qty 1

## 2022-05-24 MED ORDER — SACUBITRIL-VALSARTAN 24-26 MG PO TABS
1.0000 | ORAL_TABLET | Freq: Two times a day (BID) | ORAL | Status: DC
  Administered 2022-05-24 – 2022-05-29 (×11): 1 via ORAL
  Filled 2022-05-24 (×11): qty 1

## 2022-05-24 MED ORDER — CYANOCOBALAMIN 1000 MCG/ML IJ SOLN
1000.0000 ug | Freq: Every day | INTRAMUSCULAR | Status: DC
  Administered 2022-05-24 – 2022-05-29 (×6): 1000 ug via SUBCUTANEOUS
  Filled 2022-05-24 (×6): qty 1

## 2022-05-24 MED ORDER — BUPROPION HCL ER (XL) 150 MG PO TB24: 150.0000 mg | ORAL_TABLET | Freq: Every day | ORAL | 2 refills | Status: DC

## 2022-05-24 MED ORDER — SPIRONOLACTONE 25 MG PO TABS: 12.5000 mg | ORAL_TABLET | Freq: Every day | ORAL | Status: DC

## 2022-05-24 MED ORDER — EMPAGLIFLOZIN 10 MG PO TABS: 10.0000 mg | ORAL_TABLET | Freq: Every day | ORAL | Status: DC

## 2022-05-24 MED ORDER — ROSUVASTATIN CALCIUM 20 MG PO TABS
20.0000 mg | ORAL_TABLET | Freq: Every day | ORAL | Status: DC
  Administered 2022-05-24 – 2022-05-29 (×6): 20 mg via ORAL
  Filled 2022-05-24 (×7): qty 1

## 2022-05-24 MED ORDER — SPIRONOLACTONE 12.5 MG HALF TABLET: 12.5000 mg | ORAL_TABLET | Freq: Every day | ORAL | Status: DC

## 2022-05-24 MED ORDER — RIVAROXABAN 20 MG PO TABS: 20.0000 mg | ORAL_TABLET | Freq: Every day | ORAL | Status: DC

## 2022-05-24 MED ORDER — PREGABALIN 150 MG PO CAPS: 150.0000 mg | ORAL_CAPSULE | Freq: Two times a day (BID) | ORAL | Status: DC

## 2022-05-24 MED ORDER — ONDANSETRON HCL 4 MG PO TABS
4.0000 mg | ORAL_TABLET | Freq: Four times a day (QID) | ORAL | Status: DC | PRN
  Administered 2022-05-24 – 2022-05-26 (×2): 4 mg via ORAL
  Filled 2022-05-24 (×2): qty 1

## 2022-05-24 MED ORDER — METOPROLOL SUCCINATE ER 25 MG PO TB24: 75.0000 mg | ORAL_TABLET | Freq: Every day | ORAL | Status: DC

## 2022-05-24 MED ORDER — ACETAMINOPHEN 325 MG PO TABS: 650.0000 mg | ORAL_TABLET | ORAL | Status: DC | PRN

## 2022-05-24 MED ORDER — CYANOCOBALAMIN 1000 MCG/ML IJ SOLN: 1000.0000 ug | INTRAMUSCULAR | Status: DC

## 2022-05-24 NOTE — Progress Notes (Addendum)
Rounding Note    Patient Name: Brendan Butler Date of Encounter: 05/24/2022  [place] Cardiologist: [name], MD   Subjective   Patient remains in rate controlled atrial flutter. Rates mostly in the 20s. He had one 4.22 second pause yesterday afternoon. He was asymptomatic with this. He is doing well this morning with no complaints - no chest pain, shortness of breath, palpitations, lightheadedness, dizziness, syncope.  Inpatient Medications    Scheduled Meds:  buPROPion  300 mg Oral Daily   clopidogrel  75 mg Oral Daily   cyanocobalamin  1,000 mcg Subcutaneous Daily   Followed by   Derrill Memo ON 05/31/2022] cyanocobalamin  1,000 mcg Subcutaneous Q30 days   empagliflozin  10 mg Oral Daily   metoprolol succinate  100 mg Oral Daily   pantoprazole  40 mg Oral Daily   PARoxetine  30 mg Oral Daily   pregabalin  150 mg Oral TID   QUEtiapine  25 mg Oral QHS   rivaroxaban  20 mg Oral Q supper   rosuvastatin  10 mg Oral Daily   Continuous Infusions:  PRN Meds: acetaminophen, ondansetron (ZOFRAN) IV   Vital Signs    Vitals:   05/23/22 2015 05/23/22 2307 05/24/22 0318 05/24/22 0558  BP: 105/78 118/82 133/63   Pulse: 72 85 70   Resp: 16 18 18    Temp: 98.3 F (36.8 C) 97.8 F (36.6 C) 98.4 F (36.9 C)   TempSrc: Oral Oral Oral   SpO2: 98% 99% 99%   Weight:    86.3 kg  Height:        Intake/Output Summary (Last 24 hours) at 05/24/2022 0817 Last data filed at 05/23/2022 1600 Gross per 24 hour  Intake 960 ml  Output 1750 ml  Net -790 ml      05/24/2022    5:58 AM 05/23/2022    7:13 AM 05/22/2022    6:50 AM  Last 3 Weights  Weight (lbs) 190 lb 4.1 oz 198 lb 13.7 oz 199 lb 1.2 oz  Weight (kg) 86.3 kg 90.2 kg 90.3 kg      Telemetry    Atrial flutter with rates mostly in the 70s. He had one 4.22 second pause yesterday afternoon.   - Personally Reviewed  ECG    No new ECG tracing today. - Personally Reviewed  Physical Exam   GEN: Caucasian male  resting comfortably in no acute distress. Pleasantly confused.   Neck: No JVD. Cardiac: Irregular rhythm with normal rate. No murmurs, rubs, or gallops.  Respiratory: Clear to auscultation bilaterally. No wheezes, rhonchi, or rales. GI: Soft, non-distended, and non-tender.  MS: No lower extremity edema. No deformity. Skin: Warm and dry. Neuro:  No focal deficits.  Psych: Normal affect. Pleasantly confused.  Labs    High Sensitivity Troponin:   Recent Labs  Lab 05/06/22 1850 05/06/22 2110 05/20/22 1532 05/20/22 1820  TROPONINIHS 28* 28* 14 12     Chemistry Recent Labs  Lab 05/21/22 0113 05/22/22 0118 05/23/22 0139 05/24/22 0051 05/24/22 0701  NA 137 139 139 137  --   K 3.6 3.7 3.8 5.7* 4.0  CL 107 104 106 103  --   CO2 22 25 23 28   --   GLUCOSE 112* 93 95 86  --   BUN 15 21 19 23   --   CREATININE 1.17 1.39* 1.41* 1.51*  --   CALCIUM 8.5* 8.5* 8.4* 8.7*  --   MG 1.8 2.2 2.1  --   --  PROT 5.8* 5.6* 5.6*  --   --   ALBUMIN 2.7* 2.7* 2.7*  --   --   AST 19 17 19   --   --   ALT 20 20 21   --   --   ALKPHOS 73 83 92  --   --   BILITOT 1.3* 1.2 1.3*  --   --   GFRNONAA >60 55* 54* 50*  --   ANIONGAP 8 10 10 6   --     Lipids  Recent Labs  Lab 05/24/22 0051  CHOL 139  TRIG 182*  HDL 27*  LDLCALC 76  CHOLHDL 5.1    Hematology Recent Labs  Lab 05/21/22 0113 05/22/22 0118 05/23/22 0139  WBC 9.8 12.0* 10.9*  RBC 4.50 4.27 4.22  HGB 13.0 12.4* 12.4*  HCT 38.8* 36.4* 35.4*  MCV 86.2 85.2 83.9  MCH 28.9 29.0 29.4  MCHC 33.5 34.1 35.0  RDW 13.0 13.2 13.0  PLT 242 244 246   Thyroid No results for input(s): "TSH", "FREET4" in the last 168 hours.  BNPNo results for input(s): "BNP", "PROBNP" in the last 168 hours.  DDimer No results for input(s): "DDIMER" in the last 168 hours.   Radiology    DG CHEST PORT 1 VIEW  Result Date: 05/23/2022 CLINICAL DATA:  Shortness of breath. EXAM: PORTABLE CHEST 1 VIEW COMPARISON:  05/20/2022 FINDINGS: The lungs are clear  without focal pneumonia, edema, pneumothorax or pleural effusion. The cardiopericardial silhouette is within normal limits for size. The visualized bony structures of the thorax are unremarkable. IMPRESSION: No active disease. Electronically Signed   By: Misty Stanley M.D.   On: 05/23/2022 07:51   Overnight EEG with video  Result Date: 05/22/2022 Lora Havens, MD     05/23/2022  9:44 AM Patient Name: DAG ROHLOFF MRN: [id number] Epilepsy Attending: Lora Havens Referring Physician/Provider: Kerney Elbe, DO Duration: 05/21/2022 2108 to 05/22/2022 2108  Patient history: 72yo M with ams. EEG to evaluate for seizure  Level of alertness: Awake, asleep  AEDs during EEG study: Pregabalin  Technical aspects: This EEG study was done with scalp electrodes positioned according to the 10-20 International system of electrode placement. Electrical activity was reviewed with band pass filter of 1-70Hz , sensitivity of 7 uV/mm, display speed of 48mm/sec with a 60Hz  notched filter applied as appropriate. EEG data were recorded continuously and digitally stored.  Video monitoring was available and reviewed as appropriate.  Description: The posterior dominant rhythm consists of 7 Hz activity of moderate voltage (25-35 uV) seen predominantly in posterior head regions, symmetric and reactive to eye opening and eye closing. Sleep was characterized by vertex waves, sleep spindles (12-14hz ), maximal fronto-central region. . EEG showed continuous generalized 6-7hz  theta slowing.  ABNORMALITY - Background slow, generalized - Continuous slow, generalized  IMPRESSION: This study is suggestive of mild diffuse encephalopathy, nonspecific etiology. No seizures or epileptiform discharges were seen throughout the recording.  Lora Havens   EEG adult  Result Date: 05/22/2022 Lora Havens, MD     05/22/2022  8:54 AM Patient Name: AANAY MARRISON MRN: [id number] Epilepsy Attending: Lora Havens Referring  Physician/Provider: Thomasene Ripple, MD Date: 05/21/2022 Duration: 26.52 mins Patient history: 72yo M with ams. EEG to evaluate for seizure Level of alertness: Awake, drowsy AEDs during EEG study: Pregabalin Technical aspects: This EEG study was done with scalp electrodes positioned according to the 10-20 International system of electrode placement. Electrical activity was reviewed with band pass filter of 1-70Hz ,  sensitivity of 7 uV/mm, display speed of 28mm/sec with a 60Hz  notched filter applied as appropriate. EEG data were recorded continuously and digitally stored.  Video monitoring was available and reviewed as appropriate. Description: The posterior dominant rhythm consists of 7 Hz activity of moderate voltage (25-35 uV) seen predominantly in posterior head regions, symmetric and reactive to eye opening and eye closing. Drowsiness was characterized by attenuation of the posterior background rhythm. EEG showed continuous generalized 6-7hz  theta slowing. Physiologic photic driving was not seen during photic stimulation.  Hyperventilation was not performed.   ABNORMALITY - Background slow, generalized - Continuous slow, generalized IMPRESSION: This study is suggestive of mild diffuse encephalopathy, nonspecific etiology. No seizures or epileptiform discharges were seen throughout the recording. Lora Havens    Cardiac Studies   TTE 12/26/2021: Impressions: 1. Left ventricular ejection fraction, by estimation, is 55 to 60%. The  left ventricle has normal function. The left ventricle demonstrates  regional wall motion abnormalities (suggestive of prior anteroseptal/LAD  infarct). The left ventricular internal  cavity size was mildly dilated. Left ventricular diastolic parameters are  consistent with Grade I diastolic dysfunction (impaired relaxation).   2. Right ventricular systolic function is normal. The right ventricular  size is normal. Tricuspid regurgitation signal is inadequate for  assessing  PA pressure.   3. The mitral valve is grossly normal. No evidence of mitral valve  regurgitation. No evidence of mitral stenosis.   4. The aortic valve is tricuspid. Aortic valve regurgitation is not  visualized. No aortic stenosis is present.   Comparison(s): LVEF has improve from prior reporting.  _______________   TEE/ DCCV 05/10/2022: 1. Successful TEE/DCCV with return to NSR. No LAA thrombus. LVEF 20-25%.  Global HK.   2. Left ventricular ejection fraction, by estimation, is 20 to 25%. The  left ventricle has severely decreased function.   3. Right ventricular systolic function is mildly reduced. The right  ventricular size is normal.   4. Left atrial size was moderately dilated. No left atrial/left atrial  appendage thrombus was detected. The LAA emptying velocity was 39 cm/s.   5. Right atrial size was mildly dilated.   6. The mitral valve is grossly normal. Mild mitral valve regurgitation.  No evidence of mitral stenosis.   7. The aortic valve is tricuspid. Aortic valve regurgitation is not  visualized. No aortic stenosis is present.   8. There is Moderate (Grade III) layered plaque involving the descending  aorta.     Patient Profile     72 y.o. 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 He was recently admitted on 05/06/2022 for altered mental status and incidentally found to be in new onset rapid atrial flutter. He underwent successful TEE/ DCCV at that time but left AMA before medications could be optimized. He was readmitted on 05/20/2022 for worsening altered mental status and was found to be back in atrial flutter with RVR. Cardiology consulted for further evaluation.   Assessment & Plan    Persistent Atrial Flutter with RVR Pause Patient was recently  admitted earlier this month for altered mental status and incidentally found to have new onset atrial flutter with RVR. He underwent successful TEE/ DCCV with restoration of sinus rhythm. TEE showed EF had dropped from 55-60% in 11/2021 to 20-25%. He left AMA before medications could be optimized.  He re-presented on 05/20/2022 with worsening mental status and was back in atrial flutter with RVR. Rates were initially in the 130s.  He was initially started on IV Diltiazem with improvement in rates. IV Diltaizem was switched to IV Amiodarone given reduced EF but Amiodarone has now been discontinued as well given concern about long-term Amiodarone use in setting of medication non-compliance. - He is still in atrial flutter with rates in the 70s. Telemetry has showed a couple of brief pauses. Longest pause was 4.22 pause yesterday afternoon. He was completely asymptomatic with this. - Will decrease Toprol-XL to 75mg  daily.  - Previously on Eliquis but has been switched to Xarelto 20mg  daily. - We have discussed concern with anticoagulation in this patient wight altered mental status, failure to thrive, medication non-compliance and falls. There is no plans for repeat cardioversion unless he can demonstrate adherence to anticoagulation. Dr. Margaretann Loveless had a long discussion with patient and family about how to be successful with medication adherence. He is going to be discharged to a SNF which is helpful because they can help assist with medication administration.  - Given pauses, will plan to arrange a 2 week live monitor at discharge for further evaluation of significant pauses. - Will arrange follow-up in the A. Fib. Can consider DCCV at that time if no further issues with noncompliance.    CAD He has a history of single-vessel CAD involving the LAD s/p multiple PCIs dating back to 2001.  Last PCI was in angioplasty for in-stent restenosis of LAD in 2015.  High-sensitivity troponin minimally elevated and flat at 28  >> 28 consistent with demand ischemia in the setting of rapid atrial flutter. - No chest pain.  - Continue Plavix 75mg  daily. Allergic to Aspirin - causes hives. - Continue high intensity statin.   Chronic CHmrEF Ischemic Cardiomyopathy EF 35-40% in 12/2012 in the setting of an anterior MI.  However, EF later normalized.  TTE 11/2021 showed LVEF of 55-60% with hypokinesis of the entire anterior septum (suggestive of prior anteroseptal/LAD infarct) grade 1 diastolic dysfunction. However, recent TEE earlier during admission earlier this month showed new drop in EF to 20-25%. This was felt to likely be tachy-mediated in setting of rapid atrial flutter. GDMT has been added. Net negative 188 mL this admission. Documented weight may not be accurate - weight 190 lbs today (down from 198 lbs yesterday and 206 lbs on admission). Creatinine slightly up from 1.41 yesterday to 1.51 today. Potassium jumped from 3.8 yesterday to 5.7 earlier this morning (shortly afternoon midnight); however, I suspect this was an error as it normalized to 4.0 on recheck without any intervention. - Euvolemic on exam.  - Entresto and Spironolactone were stopped earlier this morning due to hyperkalemia; however, I think this was an error given repeat potassium was normal without any intervention. I think it is safe to restart both Entresto 24-26mg  twice daily and Spironolactone 12.5mg  daily. - Will decrease Toprol-XL to 75mg  daily. - Continue Jardiance 10mg  daily.  - Continue to monitor daily weights, strict I/Os, and renal function.   Hypertension BP mostly well controlled.  - Continue medications for CHF as above.   Hyperlipidemia Lipid panel this admission: Total Cholesterol 139, triglycerides 182, HDL 27, LDL 76. LDL goal <55 given multiple MIs. - Will increase Crestor to 20mg  daily.  - Will need repeat lipid panel and LFTs in 6-8 weeks.    CKD Stage III Recent baseline creatinine seems to have ranged from 1.3 to 1.8. -  Creatinine  up slightly from 1.41 yesterday to 1.51 today.  - Continue to monitor closely.  Hyperkalemia Potassium up from 3.8 yesterday to 5.7 earlier today (shortly after midnight). Spironolactone and Entresto were held. Repeat potassium around 7:00am showed potassium was back down to 4.0.  - Suspect reading of 5.7 was an error. I think it is okay to restart Spironolactone and Entresto as above. - Continue to monitor closely.   Otherwise, per primary team: - Altered mental status: Neurology consulted and recommended outpatient neurocognitive evaluation  - Chronic leukocytosis - Pulmonary nodules - Falls with multiple rib fractures - Hypoalbuminemia   For questions or updates, please contact [place] Please consult [email or web address] for contact info under        Signed, Darreld Mclean, PA-C  05/24/2022, 8:17 AM    Patient seen and examined with Sande Rives PA-C.  Agree as above, with the following exceptions and changes as noted below. Feeling well, no concerns. Asymptomatic with the 4.2 second pause, not significant in atrial flutter. Gen: NAD, CV: RRR, no murmurs, Lungs: clear, Abd: soft, Extrem: Warm, well perfused, no edema, Neuro/Psych: alert and oriented x 3, normal mood and affect. All available labs, radiology testing, previous records reviewed. Will decrease dose of metoprolol as noted above, if he goes home during the week, may be optimal to put a live telemetry monitor on him going to SNF or home. Otherwise, if discharged over the weekend, we can likely mail a monitor to his family to bring to his facility and help with placement. Potassium recheck is normal, can resume his HF meds.   Elouise Munroe, MD 05/24/22 10:01 AM

## 2022-05-24 NOTE — TOC Transition Note (Signed)
Transition of Care [facility]) - CM/SW Discharge Note   Patient Details  Name: Brendan Butler MRN: [id number] Date of Birth: 1951-02-20  [facility] CM/SW Contact:  Vinie Sill, LCSW Phone Number: 05/24/2022, 2:00 PM   Clinical Narrative:     Patient will Discharge to: [facility] Discharge Date: 05/24/2022 Family Notified: daughter Transport By: Corey Harold  Per MD patient is ready for discharge. RN, patient, and facility notified of discharge. Discharge Summary sent to facility. RN given number for [phone], Rm [phone]. Ambulance transport requested for patient.   Clinical Social Worker signing off.  Thurmond Butts, MSW, LCSW Clinical Social Worker     Final next level of care: Skilled Nursing Facility Barriers to Discharge: Barriers Resolved   Patient Goals and CMS Choice      Discharge Placement                         Discharge Plan and Services Additional resources added to the After Visit Summary for   In-house Referral: Clinical Social Work                                   Social Determinants of Health ([place]) Interventions SDOH Screenings   Food Insecurity: No Food Insecurity (05/21/2022)  Housing: Low Risk  (05/21/2022)  Transportation Needs: No Transportation Needs (05/21/2022)  Utilities: Not At Risk (05/21/2022)  Tobacco Use: High Risk (05/20/2022)     Readmission Risk Interventions     No data to display

## 2022-05-24 NOTE — Progress Notes (Signed)
Mobility Specialist: Progress Note   05/24/22 1027  Mobility  Activity Ambulated with assistance in hallway  Level of Assistance Standby assist, set-up cues, supervision of patient - no hands on  Assistive Device None  Distance Ambulated (ft) 470 ft  Activity Response Tolerated well  Mobility Referral Yes  $Mobility charge 1 Mobility   Pre-Mobility: 70 HR During Mobility: 101 HR Post-Mobility: 91 HR, 100% SpO2  Received pt in bed having no complaints and agreeable to mobility. Mod I with bed mobility and standby assist during ambulation. Pt was asymptomatic throughout ambulation and returned to room w/o fault. Left in chair w/ call bell in reach and all needs met.  Brendan Butler Mobility Specialist Please contact via SecureChat or Rehab office at [phone]

## 2022-05-24 NOTE — Progress Notes (Signed)
Physical Therapy Treatment Patient Details Name: Brendan Butler MRN: [id number] DOB: 01/23/1951 Today's Date: 05/24/2022   History of Present Illness Pt is a 72 y.o. male who presented from home to the ED with AMS and falls. CT revealed multi R rib fxs. He had a recent hospitalization 3/10-3/14 with similar presentation and diagnosed with a fib with RVR. PMH: HTN, hyperlipidemia, CAD, CHF, obstructive sleep apnea.    PT Comments    PTA arrived just as pt had returned to supine, pt c/o increased fatigue/nausea and RN obtained emesis bag for him. Pt agreeable to instruction on techniques to reduce risk of orthostatic symptoms such as using TED hose, supine/seated UE/LE exercises prior to transfers and taking more time to hydrate/between transfers for pt safety and self-monitoring for symptoms as well as activity pacing. Pt measured for TED hose during session by PTA and ordered via Unit Secretary at end of session. Pt continues to benefit from PT services to progress toward functional mobility goals, session time limited due to arrival of clinician to review ZIO patch and place this, plan to complete rest of education later in day after TED hose arrive but prior to pt departure for SNF.   Recommendations for follow up therapy are one component of a multi-disciplinary discharge planning process, led by the attending physician.  Recommendations may be updated based on patient status, additional functional criteria and insurance authorization.  Follow Up Recommendations  Can patient physically be transported by private vehicle: Yes    Assistance Recommended at Discharge Frequent or constant Supervision/Assistance  Patient can return home with the following Assist for transportation;Assistance with cooking/housework;Direct supervision/assist for medications management;Help with stairs or ramp for entrance;A little help with walking and/or transfers;A little help with bathing/dressing/bathroom    Equipment Recommendations  None recommended by PT    Recommendations for Other Services       Precautions / Restrictions Precautions Precautions: Fall Precaution Comments: orthostatic hypotension symptoms 3/28 Restrictions Weight Bearing Restrictions: No     Mobility  Bed Mobility Overal bed mobility: Needs Assistance Bed Mobility: Supine to Sit     Supine to sit: Supervision     General bed mobility comments: supervision to long sitting while repositioning in bed; pt defers OOB due to recent orthostatic hypotension and nausea    Transfers                   General transfer comment: declining OOB mobility    Ambulation/Gait                   Stairs             Wheelchair Mobility    Modified Rankin (Stroke Patients Only)       Balance Overall balance assessment: Needs assistance Sitting-balance support: Single extremity supported Sitting balance-Leahy Scale: Good Sitting balance - Comments: fair to good long sitting balance, pt defer OOB due to sx nausea/fatigue, did work with mobility specialist in AM                                    Cognition Arousal/Alertness: Awake/alert Behavior During Therapy: WFL for tasks assessed/performed Overall Cognitive Status: Within Functional Limits for tasks assessed Area of Impairment: Following commands, Memory                     Memory: Decreased short-term memory Following Commands: Follows one step commands with  increased time       General Comments: Patient at times internally distracted due to c/o nausea and lightheadedness after transfer to/from bathroom and back to bed with nursing assist, but when PTA re-entered his room, pt following most simple commands. With bed-level exercises, pt has trouble counting repetitions properly and remembering to continue repetitions on each leg after completing them on one side.        Exercises Other Exercises Other  Exercises: reviewed supine BUE AROM: elbow/shoulder flex/ext for improved hemodynamics x10 reps and ankle pumps x10 reps prior to positional change    General Comments General comments (skin integrity, edema, etc.): BP 85/56 taken by RN just prior to PTA arrival to room and pt in supine, c/o nausea; Pt repositioned for comfort with BLE elevated and heels floated and re-check BP reading 100/73 and HR 69 bpm toward end of session, nausea still present but improving; Pt B thighs and inseam measured for TED hose in hopes this will help with hemodynamic stability once pt OOB again, MD placed order and unit secretary notified. clinician entering room to place Zio patch and provide education, so PTA ending session early, plan to reassess him later after TED hose arrive.      Pertinent Vitals/Pain Pain Assessment Pain Assessment: No/denies pain Pain Intervention(s): Monitored during session    Home Living                          Prior Function            PT Goals (current goals can now be found in the care plan section) Acute Rehab PT Goals Patient Stated Goal: home PT Goal Formulation: With patient Time For Goal Achievement: 06/04/22 Progress towards PT goals: Progressing toward goals    Frequency    Min 1X/week      PT Plan Current plan remains appropriate    Co-evaluation              AM-PAC PT "6 Clicks" Mobility   Outcome Measure  Help needed turning from your back to your side while in a flat bed without using bedrails?: A Little Help needed moving from lying on your back to sitting on the side of a flat bed without using bedrails?: A Little Help needed moving to and from a bed to a chair (including a wheelchair)?: A Little Help needed standing up from a chair using your arms (e.g., wheelchair or bedside chair)?: A Little Help needed to walk in hospital room?: A Lot (anticipate due to orthostatic symptoms) Help needed climbing 3-5 steps with a railing? :  Total 6 Click Score: 15    End of Session   Activity Tolerance: Treatment limited secondary to medical complications (Comment);Other (comment) (nausea/fatigue after recent orthostatic episode with nursing assist prior to PTA entering room) Patient left: in bed;with call bell/phone within reach;with bed alarm set;with family/visitor present;Other (comment) (pt heels floated) Nurse Communication: Mobility status;Precautions;Other (comment) (pt needs TED hose) PT Visit Diagnosis: Difficulty in walking, not elsewhere classified (R26.2);Repeated falls (R29.6)     Time: [id number] PT Time Calculation (min) (ACUTE ONLY): 8 min  Charges:  $Therapeutic Activity: 8-22 mins                     Remee Charley P., PTA Acute Rehabilitation Services Secure Chat Preferred 9a-5:30pm Office: [place] 05/24/2022, 12:31 PM

## 2022-05-24 NOTE — Care Management Important Message (Signed)
Important Message  Patient Details  Name: Brendan Butler MRN: [id number] Date of Birth: [birth year]   Medicare Important Message Given:  Yes     [name] 05/24/2022, 3:21 PM

## 2022-05-24 NOTE — Progress Notes (Signed)
Report given to receiving nurse in [facility] questioned were answered

## 2022-05-24 NOTE — Telephone Encounter (Addendum)
   Received page from [facility] about abnormal EKG. Called and spoke with rep. Patient had first reported episode of rate control atrial flutter with ventricular rate in the 60s to 70s shortly after 4pm. I am very familiar with this patient and have been following him in the hospital this week. He is being discharged from the hospital today and we placed a live monitor for further evaluation of pauses. He has been in rate controlled atrial flutter during admission with rates in the 70s. He is on a beta-blocker and anticoagulation. Atrial flutter is known so new recommendations at this time. Will wait for full monitor results.  Darreld Mclean, PA-C 05/24/2022 6:30 PM  ADDENDUM: Received another page from [facility]. Called and spoke with rep. Patient had a 4 second pause followed by a 5.8 second pause. Patient is currently still admitted so went to reviewed telemetry which confirms this. He had a 4 second pause followed by one normal beat and then immediately followed by another 5.8 second pause. Will cancel discharge, stop Metoprolol, and ask EP to see tomorrow. Notified RN and primary team about this plan.  Darreld Mclean, PA-C 05/24/2022 6:48 PM

## 2022-05-24 NOTE — Discharge Summary (Addendum)
Physician Discharge Summary  Brendan Butler [id number] DOB: 29-Nov-1950 DOA: 05/20/2022  PCP: Wenda Low, MD  Admit date: 05/20/2022 Discharge date: 05/24/2022  Admitted From: (Home) Disposition:  (facility name)  Recommendations for Outpatient Follow-up:  He is recheck B12 level in 4 to 6 weeks to see if it improves on oral treatment, otherwise will need IM supplements Please check CBC, BMP, magnesium in 3 days 5 monitor has been arranged by cardiology at time of discharge  Discharge Condition: (Stable) CODE STATUS: (FULL) Diet recommendation: Heart Healthy   Brief/Interim Summary:  Brendan Butler is a 72 y.o. male with PMH significant for CAD, HLD, ED, OSA not on CPAP, HTN admitted with generalized weakness and falls. He was just discharged from the hospital about 10 days ago when he was admitted with Afibb with RVR and aflutter and cardioverted. Echo with reduced EF. He was discharged but inconsistent medication adherence and reportedly too weak to really get out of bed and not eating much and numerous falls , he was seen by cardiology, his medication has been adjusted.  Persistent A-fib with RVR Patient was recently admitted earlier this month for altered mental status and incidentally found to have new onset atrial flutter with RVR. He underwent successful TEE/ DCCV with restoration of sinus rhythm. TEE showed EF had dropped from 55-60% in 11/2021 to 20-25%. He left AMA before medications could be optimized. He re-presented on 05/20/2022 with worsening mental status and was back in atrial flutter with RVR. Rates were initially in the 130s.  He was initially started on IV Diltiazem with improvement in rates. IV Diltaizem was switched to IV Amiodarone given reduced EF but Amiodarone has now been discontinued as well given concern about long-term Amiodarone use in setting of medication non-compliance.  -Patient has been adjusted by cardiology, Eliquis has been changed to Xarelto,  Lopressor has been changed to Toprol-XL, dose has been increased from 100 mg daily to 75 mg daily given 4.2 asymptomatic pauses. -Cardiology arranged for live monitor at time of discharge.  CAD - Continue Plavix 75mg  daily.  - Continue high intensity statin.  Chronic CHmrEF Ischemic Cardiomyopathy EF 35-40% in 12/2012 in the setting of an anterior MI.  However, EF later normalized.  TTE 11/2021 showed LVEF of 55-60% with hypokinesis of the entire anterior septum (suggestive of prior anteroseptal/LAD infarct) grade 1 diastolic dysfunction. However, recent TEE earlier during admission earlier this month showed new drop in EF to 20-25%. This was felt to likely be tachy-mediated in setting of rapid atrial flutter.  -Medication has been adjusted by cardiology, continue with Entresto, Toprol-XL, Jardiance, spironolactone has been added. (Potassium was 5.7 earlier today, but this is likely due to lab error as repeat level was 4 without any interval change) -He is euvolemic on exam today  Hypertension - well-controlled, continue with medication for CHF as above  Hyperlipidemia  - continue with high intensity statin  CKD stage III -Renal function stable  Confusion and acute metabolic encephalopathy due to delirium with likely progressive Cognitive Decline -Patient back to baseline, EEG and MRI nonrevealing -He is with known B12 deficiency, will replete, monitor liver closely as an outpatient   B12 deficiency - Please see above discussion  Depression -continue bupropion, Paxil, Seroquel  Falls with multiple fractures Displaced Ribs -Having multiple falls and could be in the setting of his a flutter -Continue with flutter valve and incentive spirometry -PT OT recommending SNF   discharge Diagnoses:  Principal Problem:   Atrial fibrillation with RVR (  [place]) Active Problems:   Delirium due to another medical condition   Falls frequently    Discharge Instructions  Discharge  Instructions     Amb referral to [facility]   Complete by: As directed    Diet - low sodium heart healthy   Complete by: As directed    Discharge instructions   Complete by: As directed    Follow with Primary MD Wenda Low, MD/ SNF physician  Get CBC, CMP,  checked  by Primary MD next visit.    Activity: As tolerated with Full fall precautions use walker/cane & assistance as needed   Disposition SNF   Diet: Heart Healthy  , with feeding assistance and aspiration precautions.  For Heart failure patients - Check your Weight same time everyday, if you gain over 2 pounds, or you develop in leg swelling, experience more shortness of breath or chest pain, call your Primary MD immediately. Follow Cardiac Low Salt Diet and 1.5 lit/day fluid restriction.   On your next visit with your primary care physician please Get Medicines reviewed and adjusted.   Please request your Prim.MD to go over all Hospital Tests and Procedure/Radiological results at the follow up, please get all Hospital records sent to your Prim MD by signing hospital release before you go home.   If you experience worsening of your admission symptoms, develop shortness of breath, life threatening emergency, suicidal or homicidal thoughts you must seek medical attention immediately by calling 911 or calling your MD immediately  if symptoms less severe.  You Must read complete instructions/literature along with all the possible adverse reactions/side effects for all the Medicines you take and that have been prescribed to you. Take any new Medicines after you have completely understood and accpet all the possible adverse reactions/side effects.   Do not drive, operating heavy machinery, perform activities at heights, swimming or participation in water activities or provide baby sitting services if your were admitted for syncope or siezures until you have seen by Primary MD or a Neurologist and advised to do so again.  Do  not drive when taking Pain medications.    Do not take more than prescribed Pain, Sleep and Anxiety Medications  Special Instructions: If you have smoked or chewed Tobacco  in the last 2 yrs please stop smoking, stop any regular Alcohol  and or any Recreational drug use.  Wear Seat belts while driving.   Please note  You were cared for by a hospitalist during your hospital stay. If you have any questions about your discharge medications or the care you received while you were in the hospital after you are discharged, you can call the unit and asked to speak with the hospitalist on call if the hospitalist that took care of you is not available. Once you are discharged, your primary care physician will handle any further medical issues. Please note that NO REFILLS for any discharge medications will be authorized once you are discharged, as it is imperative that you return to your primary care physician (or establish a relationship with a primary care physician if you do not have one) for your aftercare needs so that they can reassess your need for medications and monitor your lab values.   Increase activity slowly   Complete by: As directed       Allergies as of 05/24/2022       Reactions   Levitra [vardenafil] Other (See Comments)   Blurred vision   Aspirin Hives   Lipitor [atorvastatin]  Other (See Comments)   Muscle cramps; patient states he does not take medication at home        Medication List     STOP taking these medications    apixaban 5 MG Tabs tablet Commonly known as: ELIQUIS   furosemide 20 MG tablet Commonly known as: LASIX   metoprolol tartrate 25 MG tablet Commonly known as: LOPRESSOR   nitroGLYCERIN 0.4 MG SL tablet Commonly known as: NITROSTAT   ramelteon 8 MG tablet Commonly known as: ROZEREM       TAKE these medications    acetaminophen 325 MG tablet Commonly known as: TYLENOL Take 2 tablets (650 mg total) by mouth every 4 (four) hours as  needed for headache or mild pain.   buPROPion 150 MG 24 hr tablet Commonly known as: WELLBUTRIN XL Take 1 tablet (150 mg total) by mouth daily. What changed: how much to take   clopidogrel 75 MG tablet Commonly known as: PLAVIX Take 1 tablet (75 mg total) by mouth daily.   cyanocobalamin 1000 MCG tablet Commonly known as: VITAMIN B12 Take 1 tablet (1,000 mcg total) by mouth daily.   empagliflozin 10 MG Tabs tablet Commonly known as: JARDIANCE Take 1 tablet (10 mg total) by mouth daily. Start taking on: May 25, 2022   fluticasone 50 MCG/ACT nasal spray Commonly known as: FLONASE Place 1 spray into both nostrils daily.   metoprolol succinate 25 MG 24 hr tablet Commonly known as: TOPROL-XL Take 3 tablets (75 mg total) by mouth daily. Start taking on: May 25, 2022   pantoprazole 40 MG tablet Commonly known as: PROTONIX Take 1 tablet (40 mg total) by mouth daily.   PARoxetine 30 MG tablet Commonly known as: PAXIL Take 30 mg by mouth daily.   potassium chloride 10 MEQ tablet Commonly known as: KLOR-CON M Take 10 mEq by mouth daily.   pregabalin 150 MG capsule Commonly known as: LYRICA Take 1 capsule (150 mg total) by mouth 2 (two) times daily. What changed:  how much to take when to take this   QUEtiapine 25 MG tablet Commonly known as: SEROQUEL Take 1 tablet (25 mg total) by mouth at bedtime.   rivaroxaban 20 MG Tabs tablet Commonly known as: XARELTO Take 1 tablet (20 mg total) by mouth daily with supper.   rosuvastatin 20 MG tablet Commonly known as: CRESTOR Take 1 tablet (20 mg total) by mouth daily. Start taking on: May 25, 2022 What changed:  medication strength how much to take   sacubitril-valsartan 24-26 MG Commonly known as: ENTRESTO Take 1 tablet by mouth 2 (two) times daily.   spironolactone 25 MG tablet Commonly known as: ALDACTONE Take 0.5 tablets (12.5 mg total) by mouth daily. Start taking on: May 25, 2022        Contact  information for after-discharge care     Destination     [facility] Preferred SNF .   Service: Skilled Nursing Contact information: [address] [place] [phone]                    Allergies  Allergen Reactions   Levitra [Vardenafil] Other (See Comments)    Blurred vision   Aspirin Hives   Lipitor [Atorvastatin] Other (See Comments)    Muscle cramps; patient states he does not take medication at home    Consultations: Cardiology Palliative Neurology   Procedures/Studies: DG CHEST PORT 1 VIEW  Result Date: 05/23/2022 CLINICAL DATA:  Shortness of breath.  EXAM: PORTABLE CHEST 1 VIEW COMPARISON:  05/20/2022 FINDINGS: The lungs are clear without focal pneumonia, edema, pneumothorax or pleural effusion. The cardiopericardial silhouette is within normal limits for size. The visualized bony structures of the thorax are unremarkable. IMPRESSION: No active disease. Electronically Signed   By: Misty Stanley M.D.   On: 05/23/2022 07:51   Overnight EEG with video  Result Date: 05/22/2022 Lora Havens, MD     05/23/2022  9:44 AM Patient Name: CATHAL REITENBACH MRN: [id number] Epilepsy Attending: Lora Havens Referring Physician/Provider: Kerney Elbe, DO Duration: 05/21/2022 2108 to 05/22/2022 2108  Patient history: 72yo M with ams. EEG to evaluate for seizure  Level of alertness: Awake, asleep  AEDs during EEG study: Pregabalin  Technical aspects: This EEG study was done with scalp electrodes positioned according to the 10-20 International system of electrode placement. Electrical activity was reviewed with band pass filter of 1-70Hz , sensitivity of 7 uV/mm, display speed of 86mm/sec with a 60Hz  notched filter applied as appropriate. EEG data were recorded continuously and digitally stored.  Video monitoring was available and reviewed as appropriate.  Description: The posterior dominant rhythm consists of 7 Hz activity of  moderate voltage (25-35 uV) seen predominantly in posterior head regions, symmetric and reactive to eye opening and eye closing. Sleep was characterized by vertex waves, sleep spindles (12-14hz ), maximal fronto-central region. . EEG showed continuous generalized 6-7hz  theta slowing.  ABNORMALITY - Background slow, generalized - Continuous slow, generalized  IMPRESSION: This study is suggestive of mild diffuse encephalopathy, nonspecific etiology. No seizures or epileptiform discharges were seen throughout the recording.  Lora Havens   EEG adult  Result Date: 05/22/2022 Lora Havens, MD     05/22/2022  8:54 AM Patient Name: KEKOA DISBROW MRN: [id number] Epilepsy Attending: Lora Havens Referring Physician/Provider: Thomasene Ripple, MD Date: 05/21/2022 Duration: 26.52 mins Patient history: 72yo M with ams. EEG to evaluate for seizure Level of alertness: Awake, drowsy AEDs during EEG study: Pregabalin Technical aspects: This EEG study was done with scalp electrodes positioned according to the 10-20 International system of electrode placement. Electrical activity was reviewed with band pass filter of 1-70Hz , sensitivity of 7 uV/mm, display speed of 48mm/sec with a 60Hz  notched filter applied as appropriate. EEG data were recorded continuously and digitally stored.  Video monitoring was available and reviewed as appropriate. Description: The posterior dominant rhythm consists of 7 Hz activity of moderate voltage (25-35 uV) seen predominantly in posterior head regions, symmetric and reactive to eye opening and eye closing. Drowsiness was characterized by attenuation of the posterior background rhythm. EEG showed continuous generalized 6-7hz  theta slowing. Physiologic photic driving was not seen during photic stimulation.  Hyperventilation was not performed.   ABNORMALITY - Background slow, generalized - Continuous slow, generalized IMPRESSION: This study is suggestive of mild diffuse encephalopathy,  nonspecific etiology. No seizures or epileptiform discharges were seen throughout the recording. Lora Havens   CT CHEST ABDOMEN PELVIS W CONTRAST  Result Date: 05/20/2022 CLINICAL DATA:  Polytrauma, blunt EXAM: CT CHEST, ABDOMEN, AND PELVIS WITH CONTRAST TECHNIQUE: Multidetector CT imaging of the chest, abdomen and pelvis was performed following the standard protocol during bolus administration of intravenous contrast. RADIATION DOSE REDUCTION: This exam was performed according to the departmental dose-optimization program which includes automated exposure control, adjustment of the mA and/or kV according to patient size and/or use of iterative reconstruction technique. CONTRAST:  76mL ISOVUE-370 IOPAMIDOL (ISOVUE-370) INJECTION 76% COMPARISON:  PET CT 04/11/2022 FINDINGS: CHEST: Cardiovascular: No  aortic injury. The thoracic aorta is normal in caliber. The heart is normal in size. No significant pericardial effusion. Coronary artery calcification with left anterior descending coronary artery stent. Mediastinum/Nodes: No pneumomediastinum. No mediastinal hematoma. The esophagus is unremarkable. The thyroid is unremarkable. The central airways are patent. No mediastinal, hilar, or axillary lymphadenopathy. Lungs/Pleura: Bilateral upper lobe thin walled subcentimeter cystic lesions. Chronic 3 mm metallic density within the right lower lobe (3:44). No focal consolidation. No pulmonary nodule. No pulmonary mass. No pulmonary contusion or laceration. No pneumatocele formation. Trace right pleural effusion.  No pneumothorax. No hemothorax. Musculoskeletal/Chest wall: No chest wall mass. Acute minimally displaced right posterior eleventh and twelfth ribs fractured in 2 different places. Acute minimally displaced right posterolateral ninth and eighth ribs. No spinal fracture. Surgical hardware of the left humerus. ABDOMEN / PELVIS: Hepatobiliary: Not enlarged. No focal lesion. No laceration or subcapsular hematoma.  Status post cholecystectomy.  No biliary ductal dilatation. Pancreas: Normal pancreatic contour. No main pancreatic duct dilatation. Spleen: Not enlarged. No focal lesion. No laceration, subcapsular hematoma, or vascular injury. Adrenals/Urinary Tract: No nodularity bilaterally. The right kidney is homogeneously enhancing. Status post left nephrectomy. No hydronephrosis. No contusion, laceration, or subcapsular hematoma. No injury to the vascular structures or collecting systems. No hydroureter. The urinary bladder is unremarkable. Stomach/Bowel: No small or large bowel wall thickening or dilatation. Colonic diverticulosis. The appendix is unremarkable. Vasculature/Lymphatics: Severe atherosclerotic plaque. No abdominal aorta or iliac aneurysm. No active contrast extravasation or pseudoaneurysm. No abdominal, pelvic, inguinal lymphadenopathy. Reproductive: Normal. Other: No simple free fluid ascites. No pneumoperitoneum. No hemoperitoneum. No mesenteric hematoma identified. No organized fluid collection. Musculoskeletal: Subcutaneus soft tissue edema and hematoma of the right flank. No acute pelvic fracture.  Chronic stable L1 compression fracture. Ports and Devices: None. IMPRESSION: 1. Acute minimally displaced right posterior 11th and 12th rib fractures in 2 different places. Acute minimally displaced right posterolateral 8th and 9th ribs. Associated subcutaneus soft tissue edema and hematoma of the right flank. 2. Trace right pleural effusion. 3. No acute intra-abdominal or intrapelvic traumatic injury. 4. No acute fracture or traumatic malalignment of the thoracic or lumbar spine. 5. Other imaging findings of potential clinical significance: Status post left nephrectomy. Aortic Atherosclerosis (ICD10-I70.0). Electronically Signed   By: Iven Finn M.D.   On: 05/20/2022 21:08   CT HEAD WO CONTRAST (5MM)  Result Date: 05/20/2022 CLINICAL DATA:  Head trauma, minor (Age >= 65y) EXAM: CT HEAD WITHOUT  CONTRAST TECHNIQUE: Contiguous axial images were obtained from the base of the skull through the vertex without intravenous contrast. RADIATION DOSE REDUCTION: This exam was performed according to the departmental dose-optimization program which includes automated exposure control, adjustment of the mA and/or kV according to patient size and/or use of iterative reconstruction technique. COMPARISON:  MRI head 05/07/2022, CT head 05/06/2022 FINDINGS: Brain: Patchy and confluent areas of decreased attenuation are noted throughout the deep and periventricular white matter of the cerebral hemispheres bilaterally, compatible with chronic microvascular ischemic disease. No evidence of large-territorial acute infarction. No parenchymal hemorrhage. No mass lesion. No extra-axial collection. No mass effect or midline shift. No hydrocephalus. Basilar cisterns are patent. Vascular: No hyperdense vessel. Atherosclerotic calcifications are present within the cavernous internal carotid arteries. Skull: No acute fracture or focal lesion. Sinuses/Orbits: Paranasal sinuses and mastoid air cells are clear. The orbits are unremarkable. Other: None. IMPRESSION: No acute intracranial abnormality. Electronically Signed   By: Iven Finn M.D.   On: 05/20/2022 19:41   DG Chest Portable 1 View  Result Date: 05/20/2022 CLINICAL DATA:  Fall. EXAM: PORTABLE CHEST 1 VIEW COMPARISON:  May 06, 2022. FINDINGS: The heart size and mediastinal contours are within normal limits. Both lungs are clear. The visualized skeletal structures are unremarkable. IMPRESSION: No active disease. Electronically Signed   By: Marijo Conception M.D.   On: 05/20/2022 16:17   ECHO TEE  Result Date: 05/10/2022    TRANSESOPHOGEAL ECHO REPORT   Patient Name:   TREVAHN WEISMILLER Date of Exam: 05/10/2022 Medical Rec #:  [id number]        Height:       70.0 in Accession #:    [id number]       Weight:       206.3 lb Date of Birth:  1950/12/26       BSA:          2.115  m Patient Age:    72 years         BP:           155/94 mmHg Patient Gender: M                HR:           68 bpm. Exam Location:  Inpatient Procedure: Transesophageal Echo, 3D Echo, Color Doppler and Cardiac Doppler Indications:     Atrial flutter  History:         Patient has prior history of Echocardiogram examinations, most                  recent 12/26/2021. CAD; Risk Factors:Former Smoker,                  Hypertension, Dyslipidemia and Sleep Apnea.  Sonographer:     Johny Chess RDCS Referring Phys:  [id number] Darreld Mclean Diagnosing Phys: Eleonore Chiquito MD PROCEDURE: After discussion of the risks and benefits of a TEE, an informed consent was obtained from the patient. TEE procedure time was 10 minutes. The transesophogeal probe was passed without difficulty through the esophogus of the patient. Imaged were obtained with the patient in a left lateral decubitus position. Sedation performed by different physician. The patient was monitored while under deep sedation. Anesthestetic sedation was provided intravenously by Anesthesiology: 285mg  of Propofol, 100mg  of Lidocaine. Image quality was excellent. The patient's vital signs; including heart rate, blood pressure, and oxygen saturation; remained stable throughout the procedure. The patient developed no complications during the procedure. A successful direct current cardioversion was performed at 120 joules with 1 attempt.  IMPRESSIONS  1. Successful TEE/DCCV with return to NSR. No LAA thrombus. LVEF 20-25%. Global HK.  2. Left ventricular ejection fraction, by estimation, is 20 to 25%. The left ventricle has severely decreased function.  3. Right ventricular systolic function is mildly reduced. The right ventricular size is normal.  4. Left atrial size was moderately dilated. No left atrial/left atrial appendage thrombus was detected. The LAA emptying velocity was 39 cm/s.  5. Right atrial size was mildly dilated.  6. The mitral valve is grossly  normal. Mild mitral valve regurgitation. No evidence of mitral stenosis.  7. The aortic valve is tricuspid. Aortic valve regurgitation is not visualized. No aortic stenosis is present.  8. There is Moderate (Grade III) layered plaque involving the descending aorta. FINDINGS  Left Ventricle: Left ventricular ejection fraction, by estimation, is 20 to 25%. The left ventricle has severely decreased function. The left ventricular internal cavity size was normal in size. Right Ventricle: The right ventricular size  is normal. No increase in right ventricular wall thickness. Right ventricular systolic function is mildly reduced. Left Atrium: Left atrial size was moderately dilated. No left atrial/left atrial appendage thrombus was detected. The LAA emptying velocity was 39 cm/s. Right Atrium: Right atrial size was mildly dilated. Pericardium: There is no evidence of pericardial effusion. Mitral Valve: The mitral valve is grossly normal. Mild mitral valve regurgitation. No evidence of mitral valve stenosis. Tricuspid Valve: The tricuspid valve is grossly normal. Tricuspid valve regurgitation is trivial. No evidence of tricuspid stenosis. Aortic Valve: The aortic valve is tricuspid. Aortic valve regurgitation is not visualized. No aortic stenosis is present. Pulmonic Valve: The pulmonic valve was grossly normal. Pulmonic valve regurgitation is trivial. No evidence of pulmonic stenosis. Aorta: The aortic root and ascending aorta are structurally normal, with no evidence of dilitation. There is moderate (Grade III) layered plaque involving the descending aorta. Venous: The left upper pulmonary vein, left lower pulmonary vein, right lower pulmonary vein and right upper pulmonary vein are normal. IAS/Shunts: No atrial level shunt detected by color flow Doppler.  LEFT VENTRICLE PLAX 2D LVOT diam:     2.00 cm LV SV:         35 LV SV Index:   17        3D Volume EF LVOT Area:     3.14 cm  LV 3D EDV:   95.68 ml                           LV 3D ESV:   74.18 ml AORTIC VALVE LVOT Vmax:   55.50 cm/s LVOT Vmean:  39.100 cm/s LVOT VTI:    0.113 m  AORTA Ao Root diam: 3.85 cm Ao Asc diam:  3.63 cm  SHUNTS Systemic VTI:  0.11 m Systemic Diam: 2.00 cm Eleonore Chiquito MD Electronically signed by Eleonore Chiquito MD Signature Date/Time: 05/10/2022/1:37:52 PM    Final    MR BRAIN WO CONTRAST  Result Date: 05/07/2022 CLINICAL DATA:  Slurred speech EXAM: MRI HEAD WITHOUT CONTRAST TECHNIQUE: Multiplanar, multiecho pulse sequences of the brain and surrounding structures were obtained without intravenous contrast. COMPARISON:  Head CT yesterday FINDINGS: Brain: Diffusion imaging is normal. Chronic small-vessel ischemic changes are seen throughout the pons. Few old small vessel cerebellar infarctions. Cerebral hemispheres show moderate chronic small-vessel ischemic changes of the white matter. No large vessel territory stroke. No mass lesion, hemorrhage, hydrocephalus or extra-axial collection. Vascular: Major vessels at the base of the brain show flow. Skull and upper cervical spine: Negative Sinuses/Orbits: Clear/normal Other: None IMPRESSION: No acute finding. Moderate chronic small-vessel ischemic changes of the pons and cerebral hemispheric white matter. Electronically Signed   By: Nelson Chimes M.D.   On: 05/07/2022 13:39   CT Head Wo Contrast  Result Date: 05/06/2022 CLINICAL DATA:  Headache. EXAM: CT HEAD WITHOUT CONTRAST TECHNIQUE: Contiguous axial images were obtained from the base of the skull through the vertex without intravenous contrast. RADIATION DOSE REDUCTION: This exam was performed according to the departmental dose-optimization program which includes automated exposure control, adjustment of the mA and/or kV according to patient size and/or use of iterative reconstruction technique. COMPARISON:  Head CT dated 12/24/2021. FINDINGS: Brain: Mild age-related atrophy and chronic microvascular ischemic changes. Small old bilateral basal ganglia  lacunar infarcts. There is no acute intracranial hemorrhage. No mass effect or midline shift. No extra-axial fluid collection. Vascular: No hyperdense vessel or unexpected calcification. Skull: Normal. Negative for fracture or focal lesion.  Sinuses/Orbits: The visualized paranasal sinuses and the mastoid air cells are clear. Right mastoid effusions. No air-fluid level. Other: None IMPRESSION: 1. No acute intracranial pathology. 2. Mild age-related atrophy and chronic microvascular ischemic changes. Small old bilateral basal ganglia lacunar infarcts. Electronically Signed   By: Anner Crete M.D.   On: 05/06/2022 20:00   DG Chest Portable 1 View  Result Date: 05/06/2022 CLINICAL DATA:  Shortness of breath EXAM: PORTABLE CHEST 1 VIEW COMPARISON:  Chest x-ray 12/28/2021 FINDINGS: The heart size and mediastinal contours are within normal limits. Both lungs are clear. The visualized skeletal structures are unremarkable. There surgical clips in the upper abdomen. IMPRESSION: No active disease. Electronically Signed   By: Ronney Asters M.D.   On: 05/06/2022 19:43      Subjective:  he denies any complaints today.  He had 4.2-second pause yesterday, he denies any symptoms.   Discharge Exam: Vitals:   05/24/22 0318 05/24/22 0840  BP: 133/63 111/64  Pulse: 70 87  Resp: 18 18  Temp: 98.4 F (36.9 C) 98.8 F (37.1 C)  SpO2: 99% 100%   Vitals:   05/23/22 2307 05/24/22 0318 05/24/22 0558 05/24/22 0840  BP: 118/82 133/63  111/64  Pulse: 85 70  87  Resp: 18 18  18   Temp: 97.8 F (36.6 C) 98.4 F (36.9 C)  98.8 F (37.1 C)  TempSrc: Oral Oral  Oral  SpO2: 99% 99%  100%  Weight:   86.3 kg   Height:        General: Pt is alert, awake, not in acute distress, frail, deconditioned Cardiovascular: irregular , S1/S2 +, no rubs, no gallops Respiratory: CTA bilaterally, no wheezing, no rhonchi Abdominal: Soft, NT, ND, bowel sounds + Extremities: no edema, no cyanosis    The results of  significant diagnostics from this hospitalization (including imaging, microbiology, ancillary and laboratory) are listed below for reference.     Microbiology: No results found for this or any previous visit (from the past 240 hour(s)).   Labs: BNP (last 3 results) Recent Labs    05/06/22 1850  BNP [id number]   Basic Metabolic Panel: Recent Labs  Lab 05/20/22 1532 05/21/22 0113 05/22/22 0118 05/23/22 0139 05/24/22 0051 05/24/22 0701  NA 141 137 139 139 137  --   K 3.7 3.6 3.7 3.8 5.7* 4.0  CL 105 107 104 106 103  --   CO2 26 22 25 23 28   --   GLUCOSE 125* 112* 93 95 86  --   BUN 14 15 21 19 23   --   CREATININE 1.26* 1.17 1.39* 1.41* 1.51*  --   CALCIUM 8.8* 8.5* 8.5* 8.4* 8.7*  --   MG  --  1.8 2.2 2.1  --   --   PHOS  --  3.7 3.4 3.5  --   --    Liver Function Tests: Recent Labs  Lab 05/20/22 1532 05/21/22 0113 05/22/22 0118 05/23/22 0139  AST 18 19 17 19   ALT 19 20 20 21   ALKPHOS 84 73 83 92  BILITOT 1.4* 1.3* 1.2 1.3*  PROT 6.1* 5.8* 5.6* 5.6*  ALBUMIN 2.7* 2.7* 2.7* 2.7*   No results for input(s): "LIPASE", "AMYLASE" in the last 168 hours. No results for input(s): "AMMONIA" in the last 168 hours. CBC: Recent Labs  Lab 05/20/22 1532 05/21/22 0113 05/22/22 0118 05/23/22 0139  WBC 9.1 9.8 12.0* 10.9*  NEUTROABS  --   --  6.8 5.5  HGB 12.8* 13.0 12.4* 12.4*  HCT  38.9* 38.8* 36.4* 35.4*  MCV 86.6 86.2 85.2 83.9  PLT 241 242 244 246   Cardiac Enzymes: No results for input(s): "CKTOTAL", "CKMB", "CKMBINDEX", "TROPONINI" in the last 168 hours. BNP: Invalid input(s): "POCBNP" CBG: Recent Labs  Lab 05/21/22 0628  GLUCAP 109*   D-Dimer No results for input(s): "DDIMER" in the last 72 hours. Hgb A1c No results for input(s): "HGBA1C" in the last 72 hours. Lipid Profile Recent Labs    05/24/22 0051  CHOL 139  HDL 27*  LDLCALC 76  TRIG 182*  CHOLHDL 5.1   Thyroid function studies No results for input(s): "TSH", "T4TOTAL", "T3FREE", "THYROIDAB" in  the last 72 hours.  Invalid input(s): "FREET3" Anemia work up Recent Labs    05/24/22 0701  FOLATE 20.3   Urinalysis    Component Value Date/Time   COLORURINE AMBER (A) 05/20/2022 2038   APPEARANCEUR CLEAR 05/20/2022 2038   LABSPEC 1.034 (H) 05/20/2022 2038   PHURINE 5.0 05/20/2022 2038   GLUCOSEU NEGATIVE 05/20/2022 2038   HGBUR NEGATIVE 05/20/2022 2038   BILIRUBINUR NEGATIVE 05/20/2022 2038   [place] NEGATIVE 05/20/2022 2038   PROTEINUR NEGATIVE 05/20/2022 2038   NITRITE NEGATIVE 05/20/2022 2038   LEUKOCYTESUR NEGATIVE 05/20/2022 2038   Sepsis Labs Recent Labs  Lab 05/20/22 1532 05/21/22 0113 05/22/22 0118 05/23/22 0139  WBC 9.1 9.8 12.0* 10.9*   Microbiology No results found for this or any previous visit (from the past 240 hour(s)).   Time coordinating discharge: Over 30 minutes  SIGNED:   Phillips Climes, MD  [facility] 05/24/2022, 10:33 AM Pager   If 7PM-7AM, please contact night-coverage [email or web address]

## 2022-05-24 NOTE — Progress Notes (Signed)
Heart Failure Navigator Progress Note  Assessed for Heart & Vascular TOC clinic readiness.  Patient EF 20-25% HFrEF, admitted with frequent falls, failure to thrive, and confusion, has Palliative care following for goals of care, a [facility] scheduled appointment on 06/07/2022, and will be discharged to a SNF. .   Navigator will sign off at this time. Earnestine Leys, BSN, RN Heart Failure Transport planner Only

## 2022-05-24 NOTE — Progress Notes (Addendum)
  Initial plan was for discharge this evening to SNF. He had a live Zio placed earlier this afternoon. Received page from [facility] about abnormal EKG. Called and spoke with rep who stated patient had a 4 second pause followed by a 5.8 second pause. Patient is currently still admitted so went to reviewed telemetry which confirms this. He had a 4 second pause followed by one normal beat and then immediately followed by another 5.8 second pause. Will cancel discharge, stop Metoprolol, and ask EP to see tomorrow. Will make NPO at midnight just in case EP recommends repeat TEE/DCCV. He may end up needing a PPM for tachybrady syndrome. Notified RN and primary team about this plan.   Darreld Mclean, PA-C 05/24/2022 6:49 PM  Called and updated daughter Anderson Malta) with plan as well. She is wondering if this could be what was causing his falls at home and that is possible. She also mentioned that this afternoon while she was here patient became very nausea and dizziness and got very pale. She states systolic BP at that time was reportedly in the 80s. It looks like this occurred around 11am. I went back and looked and telemetry at this time and no significant pauses noted so suspect episode was just due to BP dropping. Most recent BP at 1605 was 90/65. Therefore, I will stop the Spironolactone. May end up needing to hold [facility] as well if BP remains low but we can reassess this tomorrow.   Called and spoke with RN who will be here overnight and asked her to let us know if he develops any symptoms.   Darreld Mclean, PA-C 05/24/2022 7:40 PM

## 2022-05-24 NOTE — Progress Notes (Signed)
Physical Therapy Treatment Patient Details Name: Brendan Butler MRN: [id number] DOB: 1950-07-22 Today's Date: 05/24/2022   History of Present Illness Pt is a 72 y.o. male who presented from home to the ED with AMS and falls. CT revealed multi R rib fxs. He had a recent hospitalization 3/10-3/14 with similar presentation and diagnosed with a fib with RVR. PMH: HTN, hyperlipidemia, CAD, CHF, obstructive sleep apnea.    PT Comments    Pt received in supine, agreeable to therapy session with emphasis on stair safety instruction, supine/seated LE exercises and use of IS (IS ordered but not in room, so PTA brought one to his room). TED hose had not yet arrived, per [place] they were on back order so shorter knee-length TED hose re-ordered and pending arrival at time of session. Pt needs multi-modal cues for supine BLE exercises and handout given for LE HEP, stair sequencing and IS use. Defer OOB transfers/gait and stair negotiation until compression stockings arrive. Pt resting in supine awaiting PTAR transport at end of session, RN notified to keep an eye out for TED hose arrival. Pt continues to benefit from PT services to progress toward functional mobility goals.    Recommendations for follow up therapy are one component of a multi-disciplinary discharge planning process, led by the attending physician.  Recommendations may be updated based on patient status, additional functional criteria and insurance authorization.  Follow Up Recommendations  Can patient physically be transported by private vehicle: Yes    Assistance Recommended at Discharge Frequent or constant Supervision/Assistance  Patient can return home with the following Assist for transportation;Assistance with cooking/housework;Direct supervision/assist for medications management;Help with stairs or ramp for entrance;A little help with walking and/or transfers;A little help with bathing/dressing/bathroom   Equipment Recommendations  None  recommended by PT    Recommendations for Other Services       Precautions / Restrictions Precautions Precautions: Fall Precaution Comments: orthostatic hypotension symptoms 3/28 Restrictions Weight Bearing Restrictions: No     Mobility  Bed Mobility Overal bed mobility: Needs Assistance Bed Mobility: Supine to Sit     Supine to sit: Supervision     General bed mobility comments: supervision to long sitting while repositioning in bed; pt defers OOB due to recent orthostatic hypotension and nausea    Transfers                   General transfer comment: declining OOB mobility    Ambulation/Gait                   Stairs Stairs: Yes       General stair comments: reviewed via visual/verbal demo and handout given to reinforce with pt/daughter. Did not have pt demo back as pt still awaiting TED hose.   Wheelchair Mobility    Modified Rankin (Stroke Patients Only)       Balance Overall balance assessment: Needs assistance Sitting-balance support: Single extremity supported Sitting balance-Leahy Scale: Good Sitting balance - Comments: fair to good long sitting balance, pt defer OOB due to sx nausea/fatigue, did work with mobility specialist in AM                                    Cognition Arousal/Alertness: Awake/alert Behavior During Therapy: WFL for tasks assessed/performed Overall Cognitive Status: Within Functional Limits for tasks assessed Area of Impairment: Following commands, Memory  Memory: Decreased short-term memory Following Commands: Follows one step commands with increased time       General Comments: Patient following most simple commands. With bed-level exercises, pt has trouble counting repetitions properly and remembering to continue repetitions on each leg after completing them on one side.        Exercises Other Exercises Other Exercises: BLE A/AAROM: heel slides, hip  abduction (AA), SAQ, ankle pumps, quad sets x10 reps ea Other Exercises: IS x 10 reps with handout to reinforce technique, pt achieves 2500    General Comments General comments (skin integrity, edema, etc.): BP WFL taken prior to session, defer orthostatics until TED hose arrived. Per Network engineer, thigh-high TED hose not available, so knee-high TED hose ordered, pt awaiting these at time of second session, RN aware to check for its arrival prior to pt DC.      Pertinent Vitals/Pain Pain Assessment Pain Assessment: No/denies pain Pain Intervention(s): Monitored during session    Home Living                          Prior Function            PT Goals (current goals can now be found in the care plan section) Acute Rehab PT Goals Patient Stated Goal: home PT Goal Formulation: With patient Time For Goal Achievement: 06/04/22 Progress towards PT goals: Progressing toward goals    Frequency    Min 1X/week      PT Plan Current plan remains appropriate    Co-evaluation              AM-PAC PT "6 Clicks" Mobility   Outcome Measure  Help needed turning from your back to your side while in a flat bed without using bedrails?: A Little Help needed moving from lying on your back to sitting on the side of a flat bed without using bedrails?: A Little Help needed moving to and from a bed to a chair (including a wheelchair)?: A Little Help needed standing up from a chair using your arms (e.g., wheelchair or bedside chair)?: A Little Help needed to walk in hospital room?: A Lot (anticipate due to orthostatic symptoms) Help needed climbing 3-5 steps with a railing? : Total 6 Click Score: 15    End of Session   Activity Tolerance: Treatment limited secondary to medical complications (Comment);Other (comment);Patient limited by fatigue (pt defer OOB, emphasis on supine BLE exercises and IS use, recent [facility]) Patient left: in bed;with call bell/phone within reach;with bed alarm  set;with family/visitor present;Other (comment) (pt heels floated) Nurse Communication: Mobility status;Precautions;Other (comment) (pt needs TED hose) PT Visit Diagnosis: Difficulty in walking, not elsewhere classified (R26.2);Repeated falls (R29.6)     Time: [id number] PT Time Calculation (min) (ACUTE ONLY): 21 min  Charges:  $Therapeutic Exercise: 8-22 mins $Therapeutic Activity: 8-22 mins                     Dshawn Mcnay P., PTA Acute Rehabilitation Services Secure Chat Preferred 9a-5:30pm Office: [place] 05/24/2022, 12:59 PM

## 2022-05-24 NOTE — Progress Notes (Signed)
Daily Progress Note   Patient Name: Brendan Butler       Date: 05/24/2022 DOB: 1950-05-08  Age: 72 y.o. MRN#: [id number] Attending Physician: Albertine Patricia, MD Primary Care Physician: Wenda Low, MD Admit Date: 05/20/2022  Reason for Consultation/Follow-up: Establishing goals of care  Subjective: Feels well, slept well, no complaints  Length of Stay: 3  Current Medications: Scheduled Meds:   buPROPion  300 mg Oral Daily   clopidogrel  75 mg Oral Daily   cyanocobalamin  1,000 mcg Subcutaneous Daily   Followed by   Derrill Memo ON 05/31/2022] cyanocobalamin  1,000 mcg Subcutaneous Q30 days   empagliflozin  10 mg Oral Daily   metoprolol succinate  75 mg Oral Daily   pantoprazole  40 mg Oral Daily   PARoxetine  30 mg Oral Daily   pregabalin  150 mg Oral TID   QUEtiapine  25 mg Oral QHS   rivaroxaban  20 mg Oral Q supper   rosuvastatin  20 mg Oral Daily   sacubitril-valsartan  1 tablet Oral BID   spironolactone  12.5 mg Oral Daily    Continuous Infusions:   PRN Meds: acetaminophen, ondansetron  Physical Exam Constitutional:      General: He is not in acute distress. Cardiovascular:     Rate and Rhythm: Normal rate.  Pulmonary:     Effort: Pulmonary effort is normal.  Skin:    General: Skin is warm and dry.  Neurological:     Mental Status: He is alert and oriented to person, place, and time.     Comments: period's of confusion, easily reoriented             Vital Signs: BP 108/65 (BP Location: Right Arm)   Pulse 69   Temp 97.6 F (36.4 C) (Oral)   Resp 16   Ht 5\' 10"  (1.778 m)   Wt 86.3 kg   SpO2 92%   BMI 27.30 kg/m  SpO2: SpO2: 92 % O2 Device: O2 Device: Room Air O2 Flow Rate: O2 Flow Rate (L/min): 2 L/min  Intake/output summary:  Intake/Output Summary (Last  24 hours) at 05/24/2022 1531 Last data filed at 05/24/2022 [id number] Gross per 24 hour  Intake 480 ml  Output 500 ml  Net -20 ml   LBM:   Baseline Weight: Weight: 93.6 kg Most recent weight: Weight: 86.3 kg       Palliative Assessment/Data: PPS 50%      Patient Active Problem List   Diagnosis Date Noted   Falls frequently 05/23/2022   Atrial fibrillation with RVR ([place]) 05/20/2022   MDD (major depressive disorder), recurrent episode, mild ([place]) 05/10/2022   Delirium due to another medical condition 05/10/2022   Atrial flutter with rapid ventricular response ([place]) 05/06/2022   Hypokalemia 05/06/2022   Altered mental status 05/06/2022   Pulmonary nodules 12/27/2021   Acute cholecystitis 12/25/2021   Symptomatic bradycardia 12/25/2021   Preoperative cardiovascular examination    Intra-abdominal abscess ([place]) 01/17/2021   Lung nodule 01/17/2021   Acute-on-chronic kidney injury ([place]) 01/17/2021   Aneurysm of ascending aorta ([place]) 01/17/2021   Stage 3a chronic kidney disease (CKD) ([place]) 01/17/2021   Weight loss 01/17/2021   Bronchiectasis ([place]) 01/17/2021  Chronic cholecystitis 01/16/2021   Traumatic hematoma of left popliteal region 01/28/2016   DJD (degenerative joint disease) of pelvis 01/28/2016   Atypical chest pain    Chest pain 01/27/2016   Unstable angina ([place]) 04/21/2013   Leukocytosis 04/21/2013   Subsequent ST elevation (STEMI) myocardial infarction of anterior wall within 4 weeks of initial infarction ([place]) 01/08/2013   Illiteracy and low-level literacy 01/08/2013   Aspirin allergy- hives 01/06/2013   STEMI 12/29/12 Rx'd with LAD DES with early stent thrombosis, STEMI-PCI 01/05/14 01/05/2013   Coronary artery disease involving native coronary artery of native heart without angina pectoris 06/29/2007   Dyslipidemia 06/25/2007   Essential hypertension 06/25/2007   ALLERGIC RHINITIS 06/25/2007   OSA on CPAP 06/25/2007    Palliative Care Assessment & Plan    HPI: 72 y.o. male  with past medical history of hypertension, hyperlipidemia, CAD, CHF, and obstructive sleep apnea  admitted on 05/20/2022 with AMS and falls.  Patient recently discharged following hospitalization on 3/14.  This admission patient being treated for A-fib/flutter with RVR.  Diltiazem drip was stopped due to low EF.  Patient has had extensive workup for altered mental status.  Concerned about progressive cognitive decline.  PMT consulted to discuss goals of care.   Assessment: Follow up today with patient.  No family at bedside.  We reviewed our conversation from yesterday.  He has no questions or concerns.  We again reviewed living will and what a living will states.  He shares he is still not ready to complete this and requests a little more time.  We discussed that is fine there is no rush to complete this.  To call his daughter and he agrees.  Spoke with daughter Brendan Butler.  Discussed patient still not ready to complete living will and she expresses understanding.  She is grateful that the MOST form has been completed.  Provided update on clinical condition.  Daughter shares this patient has been speaking to his family more about end-of-life planning.  Family and patient planning for SNF placement.  Recommendations/Plan: MOST completed: Full code full scope Referred to outpatient palliative SNF placement Patient not yet ready to complete living will  Code Status: Full code  Care plan was discussed with patient and daughter  Thank you for allowing the Palliative Medicine Team to assist in the care of this patient.  *Please note that this is a verbal dictation therefore any spelling or grammatical errors are due to the "[place] [employer]" system interpretation.  Juel Burrow, DNP, [facility] Palliative Medicine Team Team Phone # [phone]  Pager [phone]

## 2022-05-25 DIAGNOSIS — I4892 Unspecified atrial flutter: Secondary | ICD-10-CM | POA: Diagnosis not present

## 2022-05-25 DIAGNOSIS — I4891 Unspecified atrial fibrillation: Secondary | ICD-10-CM | POA: Diagnosis not present

## 2022-05-25 MED ORDER — CLOPIDOGREL BISULFATE 75 MG PO TABS
75.0000 mg | ORAL_TABLET | Freq: Every day | ORAL | Status: DC
  Administered 2022-05-25 – 2022-05-27 (×3): 75 mg via ORAL
  Filled 2022-05-25 (×3): qty 1

## 2022-05-25 MED ORDER — AMIODARONE HCL IN DEXTROSE 360-4.14 MG/200ML-% IV SOLN
30.0000 mg/h | INTRAVENOUS | Status: DC
  Administered 2022-05-25 – 2022-05-27 (×5): 30 mg/h via INTRAVENOUS
  Filled 2022-05-25 (×6): qty 200

## 2022-05-25 MED ORDER — SODIUM CHLORIDE 0.9 % IV SOLN: INTRAVENOUS | Status: DC

## 2022-05-25 NOTE — Progress Notes (Signed)
Physical Therapy Treatment Patient Details Name: Brendan Butler MRN: [id number] DOB: 1950/05/25 Today's Date: 05/25/2022   History of Present Illness Pt is a 72 y.o. male who presented from home to the ED with AMS and falls. CT revealed multi R rib fxs. He had a recent hospitalization 3/10-3/14 with similar presentation and diagnosed with a fib with RVR. PMH: HTN, hyperlipidemia, CAD, CHF, obstructive sleep apnea.    PT Comments    Pt received in supine, agreeable to therapy session and with good participation and improved tolerance for seated/standing exercises and short household distance gait trial. Pt needing up to minA for bed mobility, transfers and gait this session due to mild increased confusion/difficulty sequencing some tasks. BP noted to be orthostatic after ~52ft gait trial in room so pt returned to recliner chair at that time, RN/MD notified. Knee-high TED hose donned throughout. Pt continues to benefit from PT services to progress toward functional mobility goals, continue to recommend short term low intensity post-acute rehab upon DC. MAP was (77) supine BP 95/67 HR 70;  MAP (76) seated BP 111/64 HR 95 (tele reading Afib);  MAP (63) standing BP 100/47 initially (asymptomatic) HR 95;  MAP (65) standing at 3 mins (neck/back "feeling tired") BP 89/53 HR 100-103.    Recommendations for follow up therapy are one component of a multi-disciplinary discharge planning process, led by the attending physician.  Recommendations may be updated based on patient status, additional functional criteria and insurance authorization.  Follow Up Recommendations  Can patient physically be transported by private vehicle: Yes    Assistance Recommended at Discharge Frequent or constant Supervision/Assistance  Patient can return home with the following Assist for transportation;Assistance with cooking/housework;Direct supervision/assist for medications management;Help with stairs or ramp for  entrance;A little help with walking and/or transfers;A little help with bathing/dressing/bathroom   Equipment Recommendations  None recommended by PT    Recommendations for Other Services       Precautions / Restrictions Precautions Precautions: Fall Precaution Comments: orthostatic hypotension symptoms 3/28-3/29 after ~3-4 mins standing Restrictions Weight Bearing Restrictions: No     Mobility  Bed Mobility Overal bed mobility: Needs Assistance Bed Mobility: Supine to Sit     Supine to sit: Min assist, HOB elevated     General bed mobility comments: pt had difficulty sequencing bed mobility and needed cues and physical assist to remove heavy blankets prior to sitting up, then was crossing his legs  and with poor awareness of where to push from to raise trunk and to move his BLE over side of bed rather than leaving them on bed while trying to raise his trunk. Pt needing increased assist today compared with previous date.    Transfers Overall transfer level: Needs assistance Equipment used: Rolling walker (2 wheels) Transfers: Sit to/from Stand Sit to Stand: Min assist, Min guard           General transfer comment: EOB<>RW and RW>chair, pt needs dense cues for safe UE placement and frequently trying to pull up on RW to stand. Often when pt cued to push from the bed with reciprocal transfers, he instead doesn't use arms and uses BLE posterior support of bed frame to assist with stability.    Ambulation/Gait Ambulation/Gait assistance: Min guard, Min assist Gait Distance (Feet): 60 Feet Assistive device: Rolling walker (2 wheels) Gait Pattern/deviations: Step-through pattern, Decreased stride length, Trunk flexed     Pre-gait activities: standing hip flexion x10 reps prior to gait General Gait Details: pt often standing too  far outside RW or with significant cervical flexion/downward gaze, dense cues for upright posture/gaze and proximity to RW, some carryover during  session, occasional minA to manage RW around obstacles in narrow spaces of room, mostly min guard for stability/safety. No c/o nausea or dizziness, mild c/o "back weakness" and neck fatigue at end of trial, BP checked standing at this time and noted to be low 89/53 (65) RN/MD notified. TED hose donned throughout (knee-high)   Stairs         General stair comments: defer due to soft BP and pt fatigue after short ambulation distance in the room   Wheelchair Mobility    Modified Rankin (Stroke Patients Only)       Balance Overall balance assessment: Needs assistance Sitting-balance support: Single extremity supported, Feet supported Sitting balance-Leahy Scale: Good     Standing balance support: Single extremity supported, Bilateral upper extremity supported, Reliant on assistive device for balance Standing balance-Leahy Scale: Fair Standing balance comment: single UE support fair balance for static standing, pt using RW for dynamic tasks and fair balance, no buckling                            Cognition Arousal/Alertness: Awake/alert Behavior During Therapy: WFL for tasks assessed/performed Overall Cognitive Status: Within Functional Limits for tasks assessed Area of Impairment: Following commands, Memory                     Memory: Decreased short-term memory, Decreased recall of precautions Following Commands: Follows one step commands with increased time Safety/Judgement: Decreased awareness of safety   Problem Solving: Difficulty sequencing, Requires verbal cues General Comments: Patient following most simple commands. Pt with decreased carryover of safety cues for BUE placement with transfers, did better with Errorless Learning strategy but still needed reminder with reciprocal transfers of where to place hands/where to push from. Pt with mild symptoms of orthostatic hypotension after ~4 mins standing but only symptoms are "my neck is tired and my back  feels tired", BP noted to be soft at this time, RN/MD notified. Of note, pt had used urinal on his own but bed sheets wet and with strong urine smell. Pt daughter in room at end of session and chair alarm on for pt safety given mild cognitive impairment.        Exercises Other Exercises Other Exercises: Seated BLE AROM: hip flexion, LAQ x10 reps ea Other Exercises: STS x 5 using arms to push from bed<>RW    General Comments General comments (skin integrity, edema, etc.): MAP was (77) supine BP 95/67 HR 70; MAP (76) seated BP 111/64 HR 95 (tele reading Afib); MAP (63) standing 100/47 initially (asymptomatic) HR 95; MAP (65) standing at 3 mins (neck/back "feeling tired") BP 89/53 HR 100-103. Strong urine smell on his sheets so bed stripped and pt up in chair with family present at end of session.      Pertinent Vitals/Pain Pain Assessment Pain Assessment: No/denies pain Pain Intervention(s): Monitored during session, Repositioned    Home Living                          Prior Function            PT Goals (current goals can now be found in the care plan section) Acute Rehab PT Goals Patient Stated Goal: home PT Goal Formulation: With patient Time For Goal Achievement: 06/04/22 Progress towards PT  goals: Progressing toward goals    Frequency    Min 1X/week      PT Plan Current plan remains appropriate    Co-evaluation              AM-PAC PT "6 Clicks" Mobility   Outcome Measure  Help needed turning from your back to your side while in a flat bed without using bedrails?: A Little Help needed moving from lying on your back to sitting on the side of a flat bed without using bedrails?: A Lot (dense sequencing cues) Help needed moving to and from a bed to a chair (including a wheelchair)?: A Little Help needed standing up from a chair using your arms (e.g., wheelchair or bedside chair)?: A Little Help needed to walk in hospital room?: A Lot (dense safety  cues) Help needed climbing 3-5 steps with a railing? : Total 6 Click Score: 14    End of Session Equipment Utilized During Treatment: Gait belt Activity Tolerance: Treatment limited secondary to medical complications (Comment);Other (comment);Patient tolerated treatment well (mildly symptomatic orthostatic hypotension, RN/MD notified) Patient left: with call bell/phone within reach;with family/visitor present;in chair;with chair alarm set Nurse Communication: Mobility status;Precautions;Other (comment) (orthostatic) PT Visit Diagnosis: Difficulty in walking, not elsewhere classified (R26.2);Repeated falls (R29.6)     Time: [id number] PT Time Calculation (min) (ACUTE ONLY): 27 min  Charges:  $Gait Training: 8-22 mins $Therapeutic Exercise: 8-22 mins                     Jayana Kotula P., PTA Acute Rehabilitation Services Secure Chat Preferred 9a-5:30pm Office: [place] 05/25/2022, 11:26 AM

## 2022-05-25 NOTE — Progress Notes (Addendum)
Rounding Note    Patient Name: Brendan Butler Date of Encounter: 05/25/2022  [place] Cardiologist: [name], MD   Subjective   Patient reports feeling a little lightheaded late last night but has not had recurrence of this today. No palpitations, chest pain, shortness of breath.  Inpatient Medications    Scheduled Meds:  buPROPion  300 mg Oral Daily   clopidogrel  75 mg Oral Daily   cyanocobalamin  1,000 mcg Subcutaneous Daily   Followed by   Derrill Memo ON 05/31/2022] cyanocobalamin  1,000 mcg Subcutaneous Q30 days   empagliflozin  10 mg Oral Daily   pantoprazole  40 mg Oral Daily   PARoxetine  30 mg Oral Daily   pregabalin  150 mg Oral TID   QUEtiapine  25 mg Oral QHS   rivaroxaban  20 mg Oral Q supper   rosuvastatin  20 mg Oral Daily   sacubitril-valsartan  1 tablet Oral BID   Continuous Infusions:  sodium chloride 50 mL/hr at 05/25/22 0929   PRN Meds: acetaminophen, ondansetron   Vital Signs    Vitals:   05/24/22 2340 05/25/22 0313 05/25/22 0524 05/25/22 0828  BP:  106/65  104/68  Pulse:    69  Resp: 14 18 11 19   Temp:  97.9 F (36.6 C)  98 F (36.7 C)  TempSrc:  Oral  Oral  SpO2:  97%  96%  Weight:   89.8 kg   Height:        Intake/Output Summary (Last 24 hours) at 05/25/2022 1004 Last data filed at 05/25/2022 0508 Gross per 24 hour  Intake 240 ml  Output 1480 ml  Net -1240 ml      05/25/2022    5:24 AM 05/24/2022    5:58 AM 05/23/2022    7:13 AM  Last 3 Weights  Weight (lbs) 197 lb 15.6 oz 190 lb 4.1 oz 198 lb 13.7 oz  Weight (kg) 89.8 kg 86.3 kg 90.2 kg      Telemetry    Atrial flutter, ventricular rates ~70 - Personally Reviewed  ECG    No new tracing - Personally Reviewed  Physical Exam   GEN: No acute distress.   Neck: No JVD Cardiac: RRR, no murmurs, rubs, or gallops.  Respiratory: Clear to auscultation bilaterally. GI: Soft, nontender, non-distended  MS: No edema; No deformity. Neuro:  Nonfocal  Psych: Normal  affect   Labs    High Sensitivity Troponin:   Recent Labs  Lab 05/06/22 1850 05/06/22 2110 05/20/22 1532 05/20/22 1820  TROPONINIHS 28* 28* 14 12     Chemistry Recent Labs  Lab 05/21/22 0113 05/22/22 0118 05/23/22 0139 05/24/22 0051 05/24/22 0701  NA 137 139 139 137  --   K 3.6 3.7 3.8 5.7* 4.0  CL 107 104 106 103  --   CO2 22 25 23 28   --   GLUCOSE 112* 93 95 86  --   BUN 15 21 19 23   --   CREATININE 1.17 1.39* 1.41* 1.51*  --   CALCIUM 8.5* 8.5* 8.4* 8.7*  --   MG 1.8 2.2 2.1  --   --   PROT 5.8* 5.6* 5.6*  --   --   ALBUMIN 2.7* 2.7* 2.7*  --   --   AST 19 17 19   --   --   ALT 20 20 21   --   --   ALKPHOS 73 83 92  --   --   BILITOT 1.3* 1.2  1.3*  --   --   GFRNONAA >60 55* 54* 50*  --   ANIONGAP 8 10 10 6   --     Lipids  Recent Labs  Lab 05/24/22 0051  CHOL 139  TRIG 182*  HDL 27*  LDLCALC 76  CHOLHDL 5.1    Hematology Recent Labs  Lab 05/21/22 0113 05/22/22 0118 05/23/22 0139  WBC 9.8 12.0* 10.9*  RBC 4.50 4.27 4.22  HGB 13.0 12.4* 12.4*  HCT 38.8* 36.4* 35.4*  MCV 86.2 85.2 83.9  MCH 28.9 29.0 29.4  MCHC 33.5 34.1 35.0  RDW 13.0 13.2 13.0  PLT 242 244 246   Thyroid No results for input(s): "TSH", "FREET4" in the last 168 hours.  BNPNo results for input(s): "BNP", "PROBNP" in the last 168 hours.  DDimer No results for input(s): "DDIMER" in the last 168 hours.   Radiology    No results found.  Cardiac Studies   05/10/22 TEE/Cardioversion  IMPRESSIONS    1. Successful TEE/DCCV with return to NSR. No LAA thrombus. LVEF 20-25%.  Global HK.   2. Left ventricular ejection fraction, by estimation, is 20 to 25%. The  left ventricle has severely decreased function.   3. Right ventricular systolic function is mildly reduced. The right  ventricular size is normal.   4. Left atrial size was moderately dilated. No left atrial/left atrial  appendage thrombus was detected. The LAA emptying velocity was 39 cm/s.   5. Right atrial size was  mildly dilated.   6. The mitral valve is grossly normal. Mild mitral valve regurgitation.  No evidence of mitral stenosis.   7. The aortic valve is tricuspid. Aortic valve regurgitation is not  visualized. No aortic stenosis is present.   8. There is Moderate (Grade III) layered plaque involving the descending  aorta.   FINDINGS   Left Ventricle: Left ventricular ejection fraction, by estimation, is 20  to 25%. The left ventricle has severely decreased function. The left  ventricular internal cavity size was normal in size.   Right Ventricle: The right ventricular size is normal. No increase in  right ventricular wall thickness. Right ventricular systolic function is  mildly reduced.   Left Atrium: Left atrial size was moderately dilated. No left atrial/left  atrial appendage thrombus was detected. The LAA emptying velocity was 39  cm/s.   Right Atrium: Right atrial size was mildly dilated.   Pericardium: There is no evidence of pericardial effusion.   Mitral Valve: The mitral valve is grossly normal. Mild mitral valve  regurgitation. No evidence of mitral valve stenosis.   Tricuspid Valve: The tricuspid valve is grossly normal. Tricuspid valve  regurgitation is trivial. No evidence of tricuspid stenosis.   Aortic Valve: The aortic valve is tricuspid. Aortic valve regurgitation is  not visualized. No aortic stenosis is present.   Pulmonic Valve: The pulmonic valve was grossly normal. Pulmonic valve  regurgitation is trivial. No evidence of pulmonic stenosis.   Aorta: The aortic root and ascending aorta are structurally normal, with  no evidence of dilitation. There is moderate (Grade III) layered plaque  involving the descending aorta.   Venous: The left upper pulmonary vein, left lower pulmonary vein, right  lower pulmonary vein and right upper pulmonary vein are normal.   IAS/Shunts: No atrial level shunt detected by color flow Doppler.   Patient Profile     72 y.o.  male with a history of CAD s/p multiple PCI's to LAD (most recently in 2018), chronic HFmrEF with EF  of 20-25% on recent TEE on 05/10/2022, hypertension, hyperlipidemia, CKD stage III, obstructive sleep apnea, pulmonary nodules, and anxiety/depression. He was recently admitted on 05/06/2022 for altered mental status and incidentally found to be in new onset rapid atrial flutter. He underwent successful TEE/ DCCV at that time but left AMA before medications could be optimized. He was readmitted on 05/20/2022 for worsening altered mental status and was found to be back in atrial flutter with RVR. Cardiology consulted for further evaluation.   Assessment & Plan    Persistent atrial flutter Intermittent pauses  Patient was recently admitted earlier this month for altered mental status and incidentally found to have new onset atrial flutter with RVR. He underwent successful TEE/ DCCV with restoration of sinus rhythm. TEE showed EF had dropped from 55-60% in 11/2021 to 20-25%. He left AMA before medications could be optimized. He re-presented on 05/20/2022 with worsening mental status and was back in atrial flutter with RVR. Rates were initially in the 130s. He was initially started on IV Diltiazem with improvement in rates. IV Diltaizem was switched to IV Amiodarone given reduced EF but Amiodarone then discontinued as well given concern about long-term Amiodarone use in setting of medication non-compliance. Patient was planned for discharge yesterday evening, but had 4 second pause followed by 5.8 second pause (with only 1 one normal beat in between). Discharge cancelled and patient kept inpatient for EP consult.  Metoprolol on hold, no recurrence of pauses but remains in atrial flutter Per EP, not a candidate for invasive EP procedures at this time. Amiodarone would be AAD option, awaiting final EP recs on this. Tentative plans for repeat TEE/DCCV, likely Monday. Continue Xarelto 20mg   CAD He has a history of  single-vessel CAD involving the LAD s/p multiple PCIs dating back to 2001.  Last PCI was in angioplasty for in-stent restenosis of LAD in 2015.  High-sensitivity troponin minimally elevated and flat at 28 >> 28 consistent with demand ischemia in the setting of rapid atrial flutter.  Remains chest pain free Continue Plavix 75mg  daily. Allergic to Aspirin - causes hives. Continue Crestor 20mg    Chronic HFrEF Ischemic Cardiomyopathy  EF 35-40% in 12/2012 in the setting of an anterior MI.  However, EF later normalized.  TTE 11/2021 showed LVEF of 55-60% with hypokinesis of the entire anterior septum (suggestive of prior anteroseptal/LAD infarct) grade 1 diastolic dysfunction. However, recent TEE earlier during admission earlier this month showed new drop in EF to 20-25%. This was felt to likely be tachy-mediated in setting of rapid atrial flutter. GDMT has been added and patient net negative 1.19L this admission  Euvolemic on exam.  Spironolactone stopped yesterday evening by night call APP due to low BP. BP now improved, will continue to hold at this time. Can revisit adding in outpatient follow up Continue Entresto 24-26. Metoprolol discontinued (see above) Continue Jardiance 10mg  daily.  Continue to monitor daily weights, strict I/Os, and renal function.   Hypertension  BP well controlled with added HF GDMT. As above, Spironolactone held with symptomatic low BP.    Hyperlipidemia  Lab Results  Component Value Date   CHOL 139 05/24/2022   HDL 27 (L) 05/24/2022   LDLCALC 76 05/24/2022   TRIG 182 (H) 05/24/2022   CHOLHDL 5.1 05/24/2022   Crestor increased to 20mg  this admission Could also consider Fenofibrate Will need repeat lipid panel and LFTs in 6-8 weeks.    CKD Stage III Recent baseline creatinine seems to have ranged from 1.3 to 1.8. - Creatinine  up slightly from 1.41 yesterday to 1.51 today.  - Continue to monitor closely.   Hyperkalemia Potassium up to 5.7 yesterday.  Spironolactone and Entresto were held. Repeat potassium around 7:00am showed potassium was back down to 4.0.   Suspected error. Entresto resumed.  Continue to monitor closely.   Otherwise, per primary team: Altered mental status: Neurology consulted and recommended outpatient neurocognitive evaluation  Chronic leukocytosis Pulmonary nodules Falls with multiple rib fractures Hypoalbuminemia       For questions or updates, please contact [place] Please consult [email or web address] for contact info under        Signed, Lily Kocher, PA-C  05/25/2022, 10:04 AM    Patient seen and examined with Lily Kocher PA-C.  Agree as above, with the following exceptions and changes as noted below. Pauses overnight. Gen: NAD, CV: RRR, no murmurs, Lungs: clear, Abd: soft, Extrem: Warm, well perfused, no edema, Neuro/Psych: alert and oriented x 3, normal mood and affect. All available labs, radiology testing, previous records reviewed. No room for TEE/DCCV on schedule today. Continue anticoagulation. D/w EP, they will see today for recommendations regarding rate and rhythm control given complex situation.   Elouise Munroe, MD 05/25/22 12:56 PM

## 2022-05-25 NOTE — Progress Notes (Signed)
PROGRESS NOTE    Brendan Butler  [id number] DOB: 04/16/1950 DOA: 05/20/2022 PCP: Wenda Low, MD   Chief Complaint  Patient presents with   Weakness    Brief Narrative:   Brendan Butler is a 72 y.o. male with PMH significant for CAD, HLD, ED, OSA not on CPAP, HTN admitted with generalized weakness and falls. He was just discharged from the hospital about 10 days ago when he was admitted with Afibb with RVR and aflutter and cardioverted. Echo with reduced EF. He was discharged but inconsistent medication adherence and reportedly too weak to really get out of bed and not eating much and numerous falls , he was seen by cardiology, his medication has been adjusted.  For which heart rate has been controlled on Toprol-XL, but he had few pauses, for which his Toprol-XL has been discontinued, discharged on 3/28 has been held regarding that, has been evaluated by EP cardiology.  Assessment & Plan:   Principal Problem:   Atrial fibrillation with RVR (HCC) Active Problems:   Delirium due to another medical condition   Falls frequently   Persistent A-fib with RVR Patient was recently admitted earlier this month for altered mental status and incidentally found to have new onset atrial flutter with RVR. He underwent successful TEE/ DCCV with restoration of sinus rhythm. TEE showed EF had dropped from 55-60% in 11/2021 to 20-25%. He left AMA before medications could be optimized. He re-presented on 05/20/2022 with worsening mental status and was back in atrial flutter with RVR. Rates were initially in the 130s.  He was initially started on IV Diltiazem with improvement in rates. IV Diltaizem was switched to IV Amiodarone given reduced EF but Amiodarone has now been discontinued as well given concern about long-term Amiodarone use in setting of medication non-compliance.  -Patient has been adjusted by cardiology, Eliquis has been changed to Xarelto, Lopressor has been changed to  Toprol-XL. -Rate overall has been controlled, but patient did develop few pauses, for which his beta-blockers has been held, and EP has been consulted.  Will await final recommendations.  CAD - Continue Plavix 75mg  daily.  - Continue high intensity statin.   Chronic CHmrEF Ischemic Cardiomyopathy EF 35-40% in 12/2012 in the setting of an anterior MI.  However, EF later normalized.  TTE 11/2021 showed LVEF of 55-60% with hypokinesis of the entire anterior septum (suggestive of prior anteroseptal/LAD infarct) grade 1 diastolic dysfunction. However, recent TEE earlier during admission earlier this month showed new drop in EF to 20-25%. This was felt to likely be tachy-mediated in setting of rapid atrial flutter.  -Medication has been adjusted by cardiology, continue with Ferne Coe, -L has been discontinued for pauses, and Aldactone has been discontinued for soft blood pressure  Hypertension - well-controlled, continue with medication for CHF as above   Hyperlipidemia  - continue with high intensity statin   CKD stage III -Renal function stable  Confusion and acute metabolic encephalopathy due to delirium with likely progressive Cognitive Decline -Patient back to baseline, EEG and MRI nonrevealing -He is with known B12 deficiency, will replete, monitor liver closely as an outpatient     B12 deficiency - Please see above discussion  Depression -continue bupropion, Paxil, Seroquel   Falls with multiple fractures Displaced Ribs -Having multiple falls and could be in the setting of his a flutter -Continue with flutter valve and incentive spirometry -PT OT recommending SNF    DVT prophylaxis: Xarelto Code Status: Full code  Family Communication: none at bedside  today, D/W daughter by phone yesterday Disposition:   Status is: Inpatient    Consultants:  Cardiology EP cards   Subjective:  eports feeling dizzy, lightheaded, he had couple of pauses on telemetry  yesterday, he denies any palpitation, chest pain or shortness of breath  Objective: Vitals:   05/25/22 0524 05/25/22 0828 05/25/22 1045 05/25/22 1120  BP:  104/68 (!) 89/53   Pulse:  69 99 83  Resp: 11 19    Temp:  98 F (36.7 C)  98.4 F (36.9 C)  TempSrc:  Oral  Oral  SpO2:  96%  94%  Weight: 89.8 kg     Height:        Intake/Output Summary (Last 24 hours) at 05/25/2022 1208 Last data filed at 05/25/2022 [id number] Gross per 24 hour  Intake 240 ml  Output 1480 ml  Net -1240 ml   Filed Weights   05/23/22 0713 05/24/22 0558 05/25/22 0524  Weight: 90.2 kg 86.3 kg 89.8 kg    Examination:  Awake Alert, Oriented X 3, frail, deconditioned Symmetrical Chest wall movement, no wheezing Irregular irregular no Gallops,Rubs or new Murmurs, No Parasternal Heave +ve B.Sounds, Abd Soft, No tenderness, No rebound - guarding or rigidity. No Cyanosis, Clubbing or edema, No new Rash or bruise      Data Reviewed: I have personally reviewed following labs and imaging studies  CBC: Recent Labs  Lab 05/20/22 1532 05/21/22 0113 05/22/22 0118 05/23/22 0139  WBC 9.1 9.8 12.0* 10.9*  NEUTROABS  --   --  6.8 5.5  HGB 12.8* 13.0 12.4* 12.4*  HCT 38.9* 38.8* 36.4* 35.4*  MCV 86.6 86.2 85.2 83.9  PLT 241 242 244 0000000    Basic Metabolic Panel: Recent Labs  Lab 05/20/22 1532 05/21/22 0113 05/22/22 0118 05/23/22 0139 05/24/22 0051 05/24/22 0701  NA 141 137 139 139 137  --   K 3.7 3.6 3.7 3.8 5.7* 4.0  CL 105 107 104 106 103  --   CO2 26 22 25 23 28   --   GLUCOSE 125* 112* 93 95 86  --   BUN 14 15 21 19 23   --   CREATININE 1.26* 1.17 1.39* 1.41* 1.51*  --   CALCIUM 8.8* 8.5* 8.5* 8.4* 8.7*  --   MG  --  1.8 2.2 2.1  --   --   PHOS  --  3.7 3.4 3.5  --   --     GFR: Estimated Creatinine Clearance: 52 mL/min (A) (by C-G formula based on SCr of 1.51 mg/dL (H)).  Liver Function Tests: Recent Labs  Lab 05/20/22 1532 05/21/22 0113 05/22/22 0118 05/23/22 0139  AST 18 19 17 19    ALT 19 20 20 21   ALKPHOS 84 73 83 92  BILITOT 1.4* 1.3* 1.2 1.3*  PROT 6.1* 5.8* 5.6* 5.6*  ALBUMIN 2.7* 2.7* 2.7* 2.7*    CBG: Recent Labs  Lab 05/21/22 0628  GLUCAP 109*     No results found for this or any previous visit (from the past 240 hour(s)).       Radiology Studies: No results found.      Scheduled Meds:  buPROPion  300 mg Oral Daily   clopidogrel  75 mg Oral Daily   cyanocobalamin  1,000 mcg Subcutaneous Daily   Followed by   Derrill Memo ON 05/31/2022] cyanocobalamin  1,000 mcg Subcutaneous Q30 days   empagliflozin  10 mg Oral Daily   pantoprazole  40 mg Oral Daily   PARoxetine  30 mg  Oral Daily   pregabalin  150 mg Oral TID   QUEtiapine  25 mg Oral QHS   rivaroxaban  20 mg Oral Q supper   rosuvastatin  20 mg Oral Daily   sacubitril-valsartan  1 tablet Oral BID   Continuous Infusions:  sodium chloride 50 mL/hr at 05/25/22 0929     LOS: 4 days      Phillips Climes, MD [facility]   To contact the attending provider between 7A-7P or the covering provider during after hours 7P-7A, please log into the web site [email or web address] and access using universal [place] password for that web site. If you do not have the password, please call the hospital operator.  05/25/2022, 12:08 PM

## 2022-05-25 NOTE — Consult Note (Signed)
ELECTROPHYSIOLOGY CONSULT NOTE    Patient ID: KENAI HAUPTMAN MRN: [id number], DOB/AGE: 05/08/1950 72 y.o.  Admit date: 05/20/2022 Date of Consult: 05/25/2022  Primary Physician: Wenda Low, MD Primary Cardiologist: [name], MD  Electrophysiologist: New   Referring Provider: Dr. Margaretann Loveless  Patient Profile: Brendan Butler is a 72 y.o. male with a history of CAD, HLD, ED, OSA not on CPAP, AF/AFL, Chronic systolic CHF, who is being seen today for the evaluation of tachy-brady syndrome at the request of Dr. Margaretann Loveless.  HPI:  Patient was admitted earlier this month for altered mental status and incidentally found to have new onset atrial flutter with RVR. He underwent successful TEE/ DCCV with restoration of sinus rhythm. TEE showed EF had dropped from 55-60% in 11/2021 to 20-25%. He left AMA before medications could be optimized. He re-presented on 05/20/2022 with worsening mental status and was back in atrial flutter with RVR. Rates were initially in the 130s.   Rates improved on IV diltiazem. He was initially started on amiodarone given reduced EF, but amiodarone was then discontinued given concern for medication compliance.   Pauses on telemetry were noted up to 4.2 seconds (asymptomatic) and Toprol was decreased.   A Zio monitor was applied and plan was to d/c to SNF when pt had a 4 second pauses *beat* 5 second pause back to back. Discharge was held with plans for EP consultation.   Pt is currently feeling OK at rest. He repeats that he is not sure he always takes his medicines. Sometimes just "forgets". He lives at home, alone, in [place]. He does mention an episode of dizziness last night. Feels like it was around dinner time but he is not sure it was at the same time as his back to back pauses. No current chest pain, SOB, or dizziness at rest. Overall feels ok when up and moving around.   Labs Potassium4.0 (03/28 0701) Magnesium  2.1 (03/27 0139) Creatinine, ser  1.51* (03/28  0051) PLT  246 (03/27 0139) HGB  12.4* (03/27 0139) WBC 10.9* (03/27 0139)  .    Past Medical History:  Diagnosis Date   Acute myocardial infarction of other anterior wall, initial episode of care 12/29/2012   Promus stent to LAD   Acute ST segment elevation MI ([place]) 01/05/2013   secondary to thrombus in stent; Brilintta changed to Effient pt with ASA allergy   Coronary artery disease 2001   stent to LAD and patent 2007 on cath   Echocardiogram abnormal 01/06/2013   EF [id number], grade I diastolic dysfunction   Erectile dysfunction    Hyperlipidemia LDL goal <70    Hypertension    OSA (obstructive sleep apnea)    No CPAP use overnight     Surgical History:  Past Surgical History:  Procedure Laterality Date   BACK SURGERY  2007   Decompression lumbar laminectomy and micro discectomy, L4-5   BRONCHIAL BIOPSY  12/28/2021   Procedure: BRONCHIAL BIOPSIES;  Surgeon: Collene Gobble, MD;  Location: [place];  Service: Pulmonary;;   BRONCHIAL BRUSHINGS  12/28/2021   Procedure: BRONCHIAL BRUSHINGS;  Surgeon: Collene Gobble, MD;  Location: [place];  Service: Pulmonary;;   BRONCHIAL NEEDLE ASPIRATION BIOPSY  12/28/2021   Procedure: BRONCHIAL NEEDLE ASPIRATION BIOPSIES;  Surgeon: Collene Gobble, MD;  Location: [place];  Service: Pulmonary;;   BRONCHIAL WASHINGS  12/28/2021   Procedure: BRONCHIAL WASHINGS;  Surgeon: Collene Gobble, MD;  Location: [facility];  Service: Pulmonary;;   CARDIAC  CATHETERIZATION  2007   patent stent to LAD and normal Cors   CARDIOVERSION N/A 05/10/2022   Procedure: CARDIOVERSION;  Surgeon: Geralynn Rile, MD;  Location: [place];  Service: Cardiovascular;  Laterality: N/A;   CHOLECYSTECTOMY N/A 12/26/2021   Procedure: LAPAROSCOPIC CHOLECYSTECTOMY, LAPAROSCOPIC LYSIS OF ADHESIONS GREATER THAN 1 HOUR;  Surgeon: [name], MD;  Location: [place];  Service: General;  Laterality: N/A;   CORONARY ANGIOPLASTY WITH STENT PLACEMENT  2001   to  LAD   CORONARY ANGIOPLASTY WITH STENT PLACEMENT  12/29/12   STEMI with promus DES to LAD   CORONARY ANGIOPLASTY WITH STENT PLACEMENT  01/05/13   STEMI with thrombosis in stent to LAD   FIDUCIAL MARKER PLACEMENT  12/28/2021   Procedure: FIDUCIAL MARKER PLACEMENT;  Surgeon: Collene Gobble, MD;  Location: [facility] ENDOSCOPY;  Service: Pulmonary;;   LEFT HEART CATH N/A 12/29/2012   Procedure: LEFT HEART CATH;  Surgeon: Jettie Booze, MD;  Location: [facility] CATH LAB;  Service: Cardiovascular;  Laterality: N/A;   LEFT HEART CATHETERIZATION WITH CORONARY ANGIOGRAM N/A 01/05/2013   Procedure: LEFT HEART CATHETERIZATION WITH CORONARY ANGIOGRAM;  Surgeon: Blane Ohara, MD;  Location: [facility] CATH LAB;  Service: Cardiovascular;  Laterality: N/A;   LEFT HEART CATHETERIZATION WITH CORONARY ANGIOGRAM N/A 04/22/2013   Procedure: LEFT HEART CATHETERIZATION WITH CORONARY ANGIOGRAM;  Surgeon: Troy Sine, MD;  Location: [facility] CATH LAB;  Service: Cardiovascular;  Laterality: N/A;   PARTIAL NEPHRECTOMY Left 1975   PATIENT ONLY HAS ONE KIDNEY   PERCUTANEOUS CORONARY STENT INTERVENTION (PCI-S)  12/29/2012   Procedure: PERCUTANEOUS CORONARY STENT INTERVENTION (PCI-S);  Surgeon: Jettie Booze, MD;  Location: [facility] CATH LAB;  Service: Cardiovascular;;   TEE WITHOUT CARDIOVERSION N/A 05/10/2022   Procedure: TRANSESOPHAGEAL ECHOCARDIOGRAM (TEE);  Surgeon: Geralynn Rile, MD;  Location: [place];  Service: Cardiovascular;  Laterality: N/A;     Medications Prior to Admission  Medication Sig Dispense Refill Last Dose   apixaban (ELIQUIS) 5 MG TABS tablet Take 1 tablet (5 mg total) by mouth 2 (two) times daily. (Patient not taking: Reported on 05/22/2022) 120 tablet 0 Not Taking   clopidogrel (PLAVIX) 75 MG tablet Take 1 tablet (75 mg total) by mouth daily. (Patient not taking: Reported on 05/22/2022) 30 tablet 0 Not Taking   cyanocobalamin (VITAMIN B12) 1000 MCG tablet Take 1 tablet (1,000 mcg total) by mouth daily.  (Patient not taking: Reported on 05/22/2022) 100 tablet 0 Not Taking   fluticasone (FLONASE) 50 MCG/ACT nasal spray Place 1 spray into both nostrils daily. (Patient not taking: Reported on 05/22/2022)   Not Taking   furosemide (LASIX) 20 MG tablet Take 1 tablet (20 mg total) by mouth daily. (Patient not taking: Reported on 05/22/2022) 30 tablet 5 Not Taking   metoprolol tartrate (LOPRESSOR) 25 MG tablet Take 1 tablet (25 mg total) by mouth 2 (two) times daily. (Patient not taking: Reported on 05/22/2022) 60 tablet 0 Not Taking   nitroGLYCERIN (NITROSTAT) 0.4 MG SL tablet Place 1 tablet (0.4 mg total) under the tongue every 5 (five) minutes x 3 doses as needed for chest pain. NEED OV. (Patient not taking: Reported on 05/22/2022) 25 tablet 0 Not Taking   pantoprazole (PROTONIX) 40 MG tablet Take 1 tablet (40 mg total) by mouth daily. (Patient not taking: Reported on 05/22/2022) 30 tablet 0 Not Taking   PARoxetine (PAXIL) 30 MG tablet Take 30 mg by mouth daily. (Patient not taking: Reported on 05/22/2022)   Not Taking   potassium  chloride (KLOR-CON M) 10 MEQ tablet Take 10 mEq by mouth daily. (Patient not taking: Reported on 05/22/2022)   Not Taking   pregabalin (LYRICA) 150 MG capsule Take 450 mg by mouth daily. (Patient not taking: Reported on 05/22/2022)   Not Taking   QUEtiapine (SEROQUEL) 25 MG tablet Take 1 tablet (25 mg total) by mouth at bedtime. (Patient not taking: Reported on 05/22/2022) 30 tablet 0 Not Taking   ramelteon (ROZEREM) 8 MG tablet Take 1 tablet (8 mg total) by mouth at bedtime. (Patient not taking: Reported on 05/22/2022) 30 tablet 0 Not Taking   rosuvastatin (CRESTOR) 10 MG tablet Take 10 mg by mouth daily. (Patient not taking: Reported on 05/22/2022)   Not Taking   sacubitril-valsartan (ENTRESTO) 24-26 MG Take 1 tablet by mouth 2 (two) times daily. (Patient not taking: Reported on 05/22/2022) 60 tablet 0 Not Taking   [DISCONTINUED] buPROPion (WELLBUTRIN XL) 150 MG 24 hr tablet Take 2 tablets  (300 mg total) by mouth daily. (Patient not taking: Reported on 05/22/2022) 30 tablet 2 Not Taking    Inpatient Medications:   buPROPion  300 mg Oral Daily   clopidogrel  75 mg Oral Daily   cyanocobalamin  1,000 mcg Subcutaneous Daily   Followed by   Derrill Memo ON 05/31/2022] cyanocobalamin  1,000 mcg Subcutaneous Q30 days   empagliflozin  10 mg Oral Daily   pantoprazole  40 mg Oral Daily   PARoxetine  30 mg Oral Daily   pregabalin  150 mg Oral TID   QUEtiapine  25 mg Oral QHS   rivaroxaban  20 mg Oral Q supper   rosuvastatin  20 mg Oral Daily   sacubitril-valsartan  1 tablet Oral BID    Allergies:  Allergies  Allergen Reactions   Levitra [Vardenafil] Other (See Comments)    Blurred vision   Aspirin Hives   Lipitor [Atorvastatin] Other (See Comments)    Muscle cramps; patient states he does not take medication at home    Family History  Problem Relation Age of Onset   Alzheimer's disease Mother    CAD Father      Physical Exam: Vitals:   05/24/22 2340 05/25/22 0313 05/25/22 0524 05/25/22 0828  BP:  106/65  104/68  Pulse:    69  Resp: 14 18 11 19   Temp:  97.9 F (36.6 C)  98 F (36.7 C)  TempSrc:  Oral  Oral  SpO2:  97%  96%  Weight:   89.8 kg   Height:        GEN- NAD, A&O x 3, normal affect HEENT: Normocephalic, atraumatic Lungs- CTAB, Normal effort.  Heart- Regular rate and rhythm, No M/G/R.  GI- Soft, NT, ND.  Extremities- No clubbing, cyanosis, or edema   Radiology/Studies: DG CHEST PORT 1 VIEW  Result Date: 05/23/2022 CLINICAL DATA:  Shortness of breath. EXAM: PORTABLE CHEST 1 VIEW COMPARISON:  05/20/2022 FINDINGS: The lungs are clear without focal pneumonia, edema, pneumothorax or pleural effusion. The cardiopericardial silhouette is within normal limits for size. The visualized bony structures of the thorax are unremarkable. IMPRESSION: No active disease. Electronically Signed   By: Misty Stanley M.D.   On: 05/23/2022 07:51   Overnight EEG with  video  Result Date: 05/22/2022 Lora Havens, MD     05/23/2022  9:44 AM Patient Name: Brendan Butler MRN: [id number] Epilepsy Attending: Lora Havens Referring Physician/Provider: Kerney Elbe, DO Duration: 05/21/2022 2108 to 05/22/2022 2108  Patient history: 72yo M with ams. EEG to  evaluate for seizure  Level of alertness: Awake, asleep  AEDs during EEG study: Pregabalin  Technical aspects: This EEG study was done with scalp electrodes positioned according to the 10-20 International system of electrode placement. Electrical activity was reviewed with band pass filter of 1-70Hz , sensitivity of 7 uV/mm, display speed of 14mm/sec with a 60Hz  notched filter applied as appropriate. EEG data were recorded continuously and digitally stored.  Video monitoring was available and reviewed as appropriate.  Description: The posterior dominant rhythm consists of 7 Hz activity of moderate voltage (25-35 uV) seen predominantly in posterior head regions, symmetric and reactive to eye opening and eye closing. Sleep was characterized by vertex waves, sleep spindles (12-14hz ), maximal fronto-central region. . EEG showed continuous generalized 6-7hz  theta slowing.  ABNORMALITY - Background slow, generalized - Continuous slow, generalized  IMPRESSION: This study is suggestive of mild diffuse encephalopathy, nonspecific etiology. No seizures or epileptiform discharges were seen throughout the recording.  Lora Havens   EEG adult  Result Date: 05/22/2022 Lora Havens, MD     05/22/2022  8:54 AM Patient Name: MICHOEL HOHIMER MRN: [id number] Epilepsy Attending: Lora Havens Referring Physician/Provider: Thomasene Ripple, MD Date: 05/21/2022 Duration: 26.52 mins Patient history: 72yo M with ams. EEG to evaluate for seizure Level of alertness: Awake, drowsy AEDs during EEG study: Pregabalin Technical aspects: This EEG study was done with scalp electrodes positioned according to the 10-20 International system of  electrode placement. Electrical activity was reviewed with band pass filter of 1-70Hz , sensitivity of 7 uV/mm, display speed of 71mm/sec with a 60Hz  notched filter applied as appropriate. EEG data were recorded continuously and digitally stored.  Video monitoring was available and reviewed as appropriate. Description: The posterior dominant rhythm consists of 7 Hz activity of moderate voltage (25-35 uV) seen predominantly in posterior head regions, symmetric and reactive to eye opening and eye closing. Drowsiness was characterized by attenuation of the posterior background rhythm. EEG showed continuous generalized 6-7hz  theta slowing. Physiologic photic driving was not seen during photic stimulation.  Hyperventilation was not performed.   ABNORMALITY - Background slow, generalized - Continuous slow, generalized IMPRESSION: This study is suggestive of mild diffuse encephalopathy, nonspecific etiology. No seizures or epileptiform discharges were seen throughout the recording. Lora Havens   CT CHEST ABDOMEN PELVIS W CONTRAST  Result Date: 05/20/2022 CLINICAL DATA:  Polytrauma, blunt EXAM: CT CHEST, ABDOMEN, AND PELVIS WITH CONTRAST TECHNIQUE: Multidetector CT imaging of the chest, abdomen and pelvis was performed following the standard protocol during bolus administration of intravenous contrast. RADIATION DOSE REDUCTION: This exam was performed according to the departmental dose-optimization program which includes automated exposure control, adjustment of the mA and/or kV according to patient size and/or use of iterative reconstruction technique. CONTRAST:  76mL ISOVUE-370 IOPAMIDOL (ISOVUE-370) INJECTION 76% COMPARISON:  PET CT 04/11/2022 FINDINGS: CHEST: Cardiovascular: No aortic injury. The thoracic aorta is normal in caliber. The heart is normal in size. No significant pericardial effusion. Coronary artery calcification with left anterior descending coronary artery stent. Mediastinum/Nodes: No  pneumomediastinum. No mediastinal hematoma. The esophagus is unremarkable. The thyroid is unremarkable. The central airways are patent. No mediastinal, hilar, or axillary lymphadenopathy. Lungs/Pleura: Bilateral upper lobe thin walled subcentimeter cystic lesions. Chronic 3 mm metallic density within the right lower lobe (3:44). No focal consolidation. No pulmonary nodule. No pulmonary mass. No pulmonary contusion or laceration. No pneumatocele formation. Trace right pleural effusion.  No pneumothorax. No hemothorax. Musculoskeletal/Chest wall: No chest wall mass. Acute minimally displaced right posterior eleventh  and twelfth ribs fractured in 2 different places. Acute minimally displaced right posterolateral ninth and eighth ribs. No spinal fracture. Surgical hardware of the left humerus. ABDOMEN / PELVIS: Hepatobiliary: Not enlarged. No focal lesion. No laceration or subcapsular hematoma. Status post cholecystectomy.  No biliary ductal dilatation. Pancreas: Normal pancreatic contour. No main pancreatic duct dilatation. Spleen: Not enlarged. No focal lesion. No laceration, subcapsular hematoma, or vascular injury. Adrenals/Urinary Tract: No nodularity bilaterally. The right kidney is homogeneously enhancing. Status post left nephrectomy. No hydronephrosis. No contusion, laceration, or subcapsular hematoma. No injury to the vascular structures or collecting systems. No hydroureter. The urinary bladder is unremarkable. Stomach/Bowel: No small or large bowel wall thickening or dilatation. Colonic diverticulosis. The appendix is unremarkable. Vasculature/Lymphatics: Severe atherosclerotic plaque. No abdominal aorta or iliac aneurysm. No active contrast extravasation or pseudoaneurysm. No abdominal, pelvic, inguinal lymphadenopathy. Reproductive: Normal. Other: No simple free fluid ascites. No pneumoperitoneum. No hemoperitoneum. No mesenteric hematoma identified. No organized fluid collection. Musculoskeletal:  Subcutaneus soft tissue edema and hematoma of the right flank. No acute pelvic fracture.  Chronic stable L1 compression fracture. Ports and Devices: None. IMPRESSION: 1. Acute minimally displaced right posterior 11th and 12th rib fractures in 2 different places. Acute minimally displaced right posterolateral 8th and 9th ribs. Associated subcutaneus soft tissue edema and hematoma of the right flank. 2. Trace right pleural effusion. 3. No acute intra-abdominal or intrapelvic traumatic injury. 4. No acute fracture or traumatic malalignment of the thoracic or lumbar spine. 5. Other imaging findings of potential clinical significance: Status post left nephrectomy. Aortic Atherosclerosis (ICD10-I70.0). Electronically Signed   By: Iven Finn M.D.   On: 05/20/2022 21:08   CT HEAD WO CONTRAST (5MM)  Result Date: 05/20/2022 CLINICAL DATA:  Head trauma, minor (Age >= 65y) EXAM: CT HEAD WITHOUT CONTRAST TECHNIQUE: Contiguous axial images were obtained from the base of the skull through the vertex without intravenous contrast. RADIATION DOSE REDUCTION: This exam was performed according to the departmental dose-optimization program which includes automated exposure control, adjustment of the mA and/or kV according to patient size and/or use of iterative reconstruction technique. COMPARISON:  MRI head 05/07/2022, CT head 05/06/2022 FINDINGS: Brain: Patchy and confluent areas of decreased attenuation are noted throughout the deep and periventricular white matter of the cerebral hemispheres bilaterally, compatible with chronic microvascular ischemic disease. No evidence of large-territorial acute infarction. No parenchymal hemorrhage. No mass lesion. No extra-axial collection. No mass effect or midline shift. No hydrocephalus. Basilar cisterns are patent. Vascular: No hyperdense vessel. Atherosclerotic calcifications are present within the cavernous internal carotid arteries. Skull: No acute fracture or focal lesion.  Sinuses/Orbits: Paranasal sinuses and mastoid air cells are clear. The orbits are unremarkable. Other: None. IMPRESSION: No acute intracranial abnormality. Electronically Signed   By: Iven Finn M.D.   On: 05/20/2022 19:41   DG Chest Portable 1 View  Result Date: 05/20/2022 CLINICAL DATA:  Fall. EXAM: PORTABLE CHEST 1 VIEW COMPARISON:  May 06, 2022. FINDINGS: The heart size and mediastinal contours are within normal limits. Both lungs are clear. The visualized skeletal structures are unremarkable. IMPRESSION: No active disease. Electronically Signed   By: Marijo Conception M.D.   On: 05/20/2022 16:17   ECHO TEE  Result Date: 05/10/2022    TRANSESOPHOGEAL ECHO REPORT   Patient Name:   KHADE KURSZEWSKI Date of Exam: 05/10/2022 Medical Rec #:  [id number]        Height:       70.0 in Accession #:    [id number]  Weight:       206.3 lb Date of Birth:  06-Feb-1951       BSA:          2.115 m Patient Age:    78 years         BP:           155/94 mmHg Patient Gender: M                HR:           68 bpm. Exam Location:  Inpatient Procedure: Transesophageal Echo, 3D Echo, Color Doppler and Cardiac Doppler Indications:     Atrial flutter  History:         Patient has prior history of Echocardiogram examinations, most                  recent 12/26/2021. CAD; Risk Factors:Former Smoker,                  Hypertension, Dyslipidemia and Sleep Apnea.  Sonographer:     Johny Chess RDCS Referring Phys:  [id number] Darreld Mclean Diagnosing Phys: Eleonore Chiquito MD PROCEDURE: After discussion of the risks and benefits of a TEE, an informed consent was obtained from the patient. TEE procedure time was 10 minutes. The transesophogeal probe was passed without difficulty through the esophogus of the patient. Imaged were obtained with the patient in a left lateral decubitus position. Sedation performed by different physician. The patient was monitored while under deep sedation. Anesthestetic sedation was provided  intravenously by Anesthesiology: 285mg  of Propofol, 100mg  of Lidocaine. Image quality was excellent. The patient's vital signs; including heart rate, blood pressure, and oxygen saturation; remained stable throughout the procedure. The patient developed no complications during the procedure. A successful direct current cardioversion was performed at 120 joules with 1 attempt.  IMPRESSIONS  1. Successful TEE/DCCV with return to NSR. No LAA thrombus. LVEF 20-25%. Global HK.  2. Left ventricular ejection fraction, by estimation, is 20 to 25%. The left ventricle has severely decreased function.  3. Right ventricular systolic function is mildly reduced. The right ventricular size is normal.  4. Left atrial size was moderately dilated. No left atrial/left atrial appendage thrombus was detected. The LAA emptying velocity was 39 cm/s.  5. Right atrial size was mildly dilated.  6. The mitral valve is grossly normal. Mild mitral valve regurgitation. No evidence of mitral stenosis.  7. The aortic valve is tricuspid. Aortic valve regurgitation is not visualized. No aortic stenosis is present.  8. There is Moderate (Grade III) layered plaque involving the descending aorta. FINDINGS  Left Ventricle: Left ventricular ejection fraction, by estimation, is 20 to 25%. The left ventricle has severely decreased function. The left ventricular internal cavity size was normal in size. Right Ventricle: The right ventricular size is normal. No increase in right ventricular wall thickness. Right ventricular systolic function is mildly reduced. Left Atrium: Left atrial size was moderately dilated. No left atrial/left atrial appendage thrombus was detected. The LAA emptying velocity was 39 cm/s. Right Atrium: Right atrial size was mildly dilated. Pericardium: There is no evidence of pericardial effusion. Mitral Valve: The mitral valve is grossly normal. Mild mitral valve regurgitation. No evidence of mitral valve stenosis. Tricuspid Valve: The  tricuspid valve is grossly normal. Tricuspid valve regurgitation is trivial. No evidence of tricuspid stenosis. Aortic Valve: The aortic valve is tricuspid. Aortic valve regurgitation is not visualized. No aortic stenosis is present. Pulmonic Valve: The pulmonic valve was grossly normal.  Pulmonic valve regurgitation is trivial. No evidence of pulmonic stenosis. Aorta: The aortic root and ascending aorta are structurally normal, with no evidence of dilitation. There is moderate (Grade III) layered plaque involving the descending aorta. Venous: The left upper pulmonary vein, left lower pulmonary vein, right lower pulmonary vein and right upper pulmonary vein are normal. IAS/Shunts: No atrial level shunt detected by color flow Doppler.  LEFT VENTRICLE PLAX 2D LVOT diam:     2.00 cm LV SV:         35 LV SV Index:   17        3D Volume EF LVOT Area:     3.14 cm  LV 3D EDV:   95.68 ml                          LV 3D ESV:   74.18 ml AORTIC VALVE LVOT Vmax:   55.50 cm/s LVOT Vmean:  39.100 cm/s LVOT VTI:    0.113 m  AORTA Ao Root diam: 3.85 cm Ao Asc diam:  3.63 cm  SHUNTS Systemic VTI:  0.11 m Systemic Diam: 2.00 cm Eleonore Chiquito MD Electronically signed by Eleonore Chiquito MD Signature Date/Time: 05/10/2022/1:37:52 PM    Final    MR BRAIN WO CONTRAST  Result Date: 05/07/2022 CLINICAL DATA:  Slurred speech EXAM: MRI HEAD WITHOUT CONTRAST TECHNIQUE: Multiplanar, multiecho pulse sequences of the brain and surrounding structures were obtained without intravenous contrast. COMPARISON:  Head CT yesterday FINDINGS: Brain: Diffusion imaging is normal. Chronic small-vessel ischemic changes are seen throughout the pons. Few old small vessel cerebellar infarctions. Cerebral hemispheres show moderate chronic small-vessel ischemic changes of the white matter. No large vessel territory stroke. No mass lesion, hemorrhage, hydrocephalus or extra-axial collection. Vascular: Major vessels at the base of the brain show flow. Skull and upper  cervical spine: Negative Sinuses/Orbits: Clear/normal Other: None IMPRESSION: No acute finding. Moderate chronic small-vessel ischemic changes of the pons and cerebral hemispheric white matter. Electronically Signed   By: Nelson Chimes M.D.   On: 05/07/2022 13:39   CT Head Wo Contrast  Result Date: 05/06/2022 CLINICAL DATA:  Headache. EXAM: CT HEAD WITHOUT CONTRAST TECHNIQUE: Contiguous axial images were obtained from the base of the skull through the vertex without intravenous contrast. RADIATION DOSE REDUCTION: This exam was performed according to the departmental dose-optimization program which includes automated exposure control, adjustment of the mA and/or kV according to patient size and/or use of iterative reconstruction technique. COMPARISON:  Head CT dated 12/24/2021. FINDINGS: Brain: Mild age-related atrophy and chronic microvascular ischemic changes. Small old bilateral basal ganglia lacunar infarcts. There is no acute intracranial hemorrhage. No mass effect or midline shift. No extra-axial fluid collection. Vascular: No hyperdense vessel or unexpected calcification. Skull: Normal. Negative for fracture or focal lesion. Sinuses/Orbits: The visualized paranasal sinuses and the mastoid air cells are clear. Right mastoid effusions. No air-fluid level. Other: None IMPRESSION: 1. No acute intracranial pathology. 2. Mild age-related atrophy and chronic microvascular ischemic changes. Small old bilateral basal ganglia lacunar infarcts. Electronically Signed   By: Anner Crete M.D.   On: 05/06/2022 20:00   DG Chest Portable 1 View  Result Date: 05/06/2022 CLINICAL DATA:  Shortness of breath EXAM: PORTABLE CHEST 1 VIEW COMPARISON:  Chest x-ray 12/28/2021 FINDINGS: The heart size and mediastinal contours are within normal limits. Both lungs are clear. The visualized skeletal structures are unremarkable. There surgical clips in the upper abdomen. IMPRESSION: No active disease. Electronically Signed  By:  Ronney Asters M.D.   On: 05/06/2022 19:43    EKG: on 3/14 showed sinus brady without underlying conduction disease (personally reviewed)  EKG on admission this time showed AFL with RVR in 130s  TELEMETRY: AFL with rates mostly in 60-70s, though higher on admission and pauses up to 5.8s (personally reviewed)  Assessment/Plan: Paroxysmal atrial flutter Slow VR With pauses up to 5.8s on toprol Hold toprol.  Would plan TEE/DCC.  Given his questionable compliance, low EF, and h/o CAD, Amiodarone will be his only AAD option.  Not candidate for invasive EP procedures at this time given questionable compliance.   Acute on chronic systolic CHF EF down previously in setting of ICM, then improved.  Now back down, likely in setting of AFL RVR GDMT per primary team.  TEE 3/14 LVEF 20-25%, RV mildly reduced  H/o CAD with h/o multiple PCIs to LAD HS trop flat this admit  Hypokalemia Potassium4.0 (03/28 0701) Magnesium  2.1 (03/27 0139) Goal K > 4.0 and Mg > 2.0   Intermittent AMS Per primary team.  Likely contributing to non-compliance, but pt has also previously left AMA.   Overall poor candidate for EP procedures. Will discuss candidacy for amiodarone with Dr. Quentin Ore.   For questions or updates, please contact [place] Please consult [email or web address] for contact info under Cardiology/STEMI.  Jacalyn Lefevre, PA-C  05/25/2022 9:31 AM

## 2022-05-26 DIAGNOSIS — I4891 Unspecified atrial fibrillation: Secondary | ICD-10-CM | POA: Diagnosis not present

## 2022-05-26 LAB — BASIC METABOLIC PANEL
Anion gap: 10 (ref 5–15)
BUN: 21 mg/dL (ref 8–23)
CO2: 22 mmol/L (ref 22–32)
Calcium: 8.2 mg/dL — ABNORMAL LOW (ref 8.9–10.3)
Chloride: 107 mmol/L (ref 98–111)
Creatinine, Ser: 1.59 mg/dL — ABNORMAL HIGH (ref 0.61–1.24)
GFR, Estimated: 47 mL/min — ABNORMAL LOW (ref >60)
Glucose, Bld: 93 mg/dL (ref 70–99)
Potassium: 3.9 mmol/L (ref 3.5–5.1)
Sodium: 139 mmol/L (ref 135–145)

## 2022-05-26 LAB — CBC
HCT: 38.3 % — ABNORMAL LOW (ref 39.0–52.0)
Hemoglobin: 12.6 g/dL — ABNORMAL LOW (ref 13.0–17.0)
MCH: 29 pg (ref 26.0–34.0)
MCHC: 32.9 g/dL (ref 30.0–36.0)
MCV: 88.2 fL (ref 80.0–100.0)
Platelets: 241 10*3/uL (ref 150–400)
RBC: 4.34 MIL/uL (ref 4.22–5.81)
RDW: 13.3 % (ref 11.5–15.5)
WBC: 10.8 10*3/uL — ABNORMAL HIGH (ref 4.0–10.5)
nRBC: 0 % (ref 0.0–0.2)

## 2022-05-26 MED ORDER — PREGABALIN 25 MG PO CAPS
150.0000 mg | ORAL_CAPSULE | Freq: Two times a day (BID) | ORAL | Status: DC
  Administered 2022-05-26 – 2022-05-29 (×6): 150 mg via ORAL
  Filled 2022-05-26 (×6): qty 2

## 2022-05-26 NOTE — Progress Notes (Signed)
Mobility Specialist Progress Note    05/26/22 1231  Mobility  Activity Ambulated with assistance in hallway  Level of Assistance Contact guard assist, steadying assist  Assistive Device Front wheel walker  Distance Ambulated (ft) 450 ft  Activity Response Tolerated well  Mobility Referral Yes  $Mobility charge 1 Mobility   Pre-Mobility: 68 HR, 118/79 (92) BP During Mobility: 105 HR Post-Mobility: 99 HR, 134/80 (97) BP  Pt received in bed and agreeable. No complaints on walk. Returned to chair with call bell in reach.   Hildred Alamin Mobility Specialist  Please Psychologist, sport and exercise or Rehab Office at [phone]

## 2022-05-26 NOTE — Progress Notes (Signed)
PROGRESS NOTE    Brendan Butler  [id number] DOB: [birth year] DOA: 05/20/2022 PCP: Wenda Low, MD   Chief Complaint  Patient presents with   Weakness    Brief Narrative:   Brendan Butler is a 72 y.o. male with PMH significant for CAD, HLD, ED, OSA not on CPAP, HTN admitted with generalized weakness and falls. He was just discharged from the hospital about 10 days ago when he was admitted with Afibb with RVR and aflutter and cardioverted. Echo with reduced EF. He was discharged but inconsistent medication adherence and reportedly too weak to really get out of bed and not eating much and numerous falls , he was seen by cardiology, his medication has been adjusted.  For which heart rate has been controlled on Toprol-XL, but he had few pauses, for which his Toprol-XL has been discontinued, discharged on 3/28 has been held regarding that, has been evaluated by EP cardiology.  Assessment & Plan:   Principal Problem:   Atrial fibrillation with RVR (HCC) Active Problems:   Delirium due to another medical condition   Falls frequently   Persistent A-fib with RVR Patient was recently admitted earlier this month for altered mental status and incidentally found to have new onset atrial flutter with RVR. He underwent successful TEE/ DCCV with restoration of sinus rhythm. TEE showed EF had dropped from 55-60% in 11/2021 to 20-25%. He left AMA before medications could be optimized. He re-presented on 05/20/2022 with worsening mental status and was back in atrial flutter with RVR. Rates were initially in the 130s.  He was initially started on IV Diltiazem with improvement in rates. IV Diltaizem was switched to IV Amiodarone given reduced EF but Amiodarone has now been discontinued as well given concern about long-term Amiodarone use in setting of medication non-compliance.  -Patient has been adjusted by cardiology, Eliquis has been changed to Xarelto, Lopressor has been changed to  Toprol-XL. -Rate overall has been controlled, but patient did develop few pauses, for which his beta-blockers has been held, and EP has been consulted with plans for cardioversion on Monday,.   CAD - Continue Plavix 75mg  daily.  - Continue high intensity statin.   Chronic CHmrEF Ischemic Cardiomyopathy EF 35-40% in 12/2012 in the setting of an anterior MI.  However, EF later normalized.  TTE 11/2021 showed LVEF of 55-60% with hypokinesis of the entire anterior septum (suggestive of prior anteroseptal/LAD infarct) grade 1 diastolic dysfunction. However, recent TEE earlier during admission earlier this month showed new drop in EF to 20-25%. This was felt to likely be tachy-mediated in setting of rapid atrial flutter.  -Medication has been adjusted by cardiology, continue with Ferne Coe,   Hypertension - well-controlled, continue with medication for CHF as above   Hyperlipidemia  - continue with high intensity statin   CKD stage III -Renal function stable  Confusion and acute metabolic encephalopathy due to delirium with likely progressive Cognitive Decline -Patient back to baseline, EEG and MRI nonrevealing -He is with known B12 deficiency, will replete, monitor liver closely as an outpatient     B12 deficiency - Please see above discussion  Depression -continue bupropion, Paxil, Seroquel   Falls with multiple fractures Displaced Ribs -Having multiple falls and could be in the setting of his a flutter -Continue with flutter valve and incentive spirometry -PT OT recommending SNF    DVT prophylaxis: Xarelto Code Status: Full code  Family Communication: none at bedside today, D/W daughter by phone yesterday Disposition:   Status is: Inpatient  Consultants:  Cardiology EP cards   Subjective: Patient denies symptoms this morning.  He was started on amiodarone and remains in atrial flutter Objective: Vitals:   05/25/22 1541 05/25/22 1948 05/26/22 0055  05/26/22 0319  BP: 118/63 (!) 99/59 115/63 106/61  Pulse: 66 80 66   Resp: 13 19 13 14   Temp: 97.8 F (36.6 C) 98.4 F (36.9 C) 97.6 F (36.4 C) 97.7 F (36.5 C)  TempSrc: Axillary Oral Oral Oral  SpO2: 98% 93% 98%   Weight:    89.8 kg  Height:        Intake/Output Summary (Last 24 hours) at 05/26/2022 0650 Last data filed at 05/26/2022 0319 Gross per 24 hour  Intake 1305.82 ml  Output 1500 ml  Net -194.18 ml    Filed Weights   05/24/22 0558 05/25/22 0524 05/26/22 0319  Weight: 86.3 kg 89.8 kg 89.8 kg    Examination:  Physical Exam Constitutional:      Appearance: He is normal weight.  Cardiovascular:     Rate and Rhythm: Rhythm irregular.     Pulses: Normal pulses.  Pulmonary:     Effort: Pulmonary effort is normal.     Breath sounds: Normal breath sounds.  Abdominal:     General: Abdomen is flat.     Palpations: Abdomen is soft.  Skin:    General: Skin is warm.  Neurological:     General: No focal deficit present.     Mental Status: He is alert and oriented to person, place, and time.         Data Reviewed: I have personally reviewed following labs and imaging studies  CBC: Recent Labs  Lab 05/20/22 1532 05/21/22 0113 05/22/22 0118 05/23/22 0139 05/26/22 0113  WBC 9.1 9.8 12.0* 10.9* 10.8*  NEUTROABS  --   --  6.8 5.5  --   HGB 12.8* 13.0 12.4* 12.4* 12.6*  HCT 38.9* 38.8* 36.4* 35.4* 38.3*  MCV 86.6 86.2 85.2 83.9 88.2  PLT 241 242 244 246 241     Basic Metabolic Panel: Recent Labs  Lab 05/21/22 0113 05/22/22 0118 05/23/22 0139 05/24/22 0051 05/24/22 0701 05/26/22 0113  NA 137 139 139 137  --  139  K 3.6 3.7 3.8 5.7* 4.0 3.9  CL 107 104 106 103  --  107  CO2 22 25 23 28   --  22  GLUCOSE 112* 93 95 86  --  93  BUN 15 21 19 23   --  21  CREATININE 1.17 1.39* 1.41* 1.51*  --  1.59*  CALCIUM 8.5* 8.5* 8.4* 8.7*  --  8.2*  MG 1.8 2.2 2.1  --   --   --   PHOS 3.7 3.4 3.5  --   --   --      GFR: Estimated Creatinine Clearance:  49.4 mL/min (A) (by C-G formula based on SCr of 1.59 mg/dL (H)).  Liver Function Tests: Recent Labs  Lab 05/20/22 1532 05/21/22 0113 05/22/22 0118 05/23/22 0139  AST 18 19 17 19   ALT 19 20 20 21   ALKPHOS 84 73 83 92  BILITOT 1.4* 1.3* 1.2 1.3*  PROT 6.1* 5.8* 5.6* 5.6*  ALBUMIN 2.7* 2.7* 2.7* 2.7*     CBG: Recent Labs  Lab 05/21/22 0628  GLUCAP 109*      No results found for this or any previous visit (from the past 240 hour(s)).       Radiology Studies: No results found.      Scheduled Meds:  buPROPion  300 mg Oral Daily   clopidogrel  75 mg Oral Daily   cyanocobalamin  1,000 mcg Subcutaneous Daily   Followed by   Derrill Memo ON 05/31/2022] cyanocobalamin  1,000 mcg Subcutaneous Q30 days   empagliflozin  10 mg Oral Daily   pantoprazole  40 mg Oral Daily   PARoxetine  30 mg Oral Daily   pregabalin  150 mg Oral TID   QUEtiapine  25 mg Oral QHS   rivaroxaban  20 mg Oral Q supper   rosuvastatin  20 mg Oral Daily   sacubitril-valsartan  1 tablet Oral BID   Continuous Infusions:  sodium chloride 50 mL/hr at 05/26/22 0541   amiodarone 30 mg/hr (05/25/22 2258)     LOS: 5 days     Time spent 40 minutes Emilee Hero, MD [facility]  Please secure chat  05/26/2022, 6:50 AM

## 2022-05-26 NOTE — Progress Notes (Signed)
Rounding Note    Patient Name: Brendan Butler Date of Encounter: 05/26/2022  [place] Cardiologist: [name], MD   Subjective   Feeling well today.  Heart rates improved on amiodarone.  Inpatient Medications    Scheduled Meds:  buPROPion  300 mg Oral Daily   clopidogrel  75 mg Oral Daily   cyanocobalamin  1,000 mcg Subcutaneous Daily   Followed by   Derrill Memo ON 05/31/2022] cyanocobalamin  1,000 mcg Subcutaneous Q30 days   empagliflozin  10 mg Oral Daily   pantoprazole  40 mg Oral Daily   PARoxetine  30 mg Oral Daily   pregabalin  150 mg Oral TID   QUEtiapine  25 mg Oral QHS   rivaroxaban  20 mg Oral Q supper   rosuvastatin  20 mg Oral Daily   sacubitril-valsartan  1 tablet Oral BID   Continuous Infusions:  sodium chloride 50 mL/hr at 05/26/22 0721   amiodarone 30 mg/hr (05/26/22 0721)   PRN Meds: acetaminophen, ondansetron   Vital Signs    Vitals:   05/25/22 1948 05/26/22 0055 05/26/22 0319 05/26/22 0806  BP: (!) 99/59 115/63 106/61 114/70  Pulse: 80 66  61  Resp: 19 13 14 15   Temp: 98.4 F (36.9 C) 97.6 F (36.4 C) 97.7 F (36.5 C) 97.9 F (36.6 C)  TempSrc: Oral Oral Oral Oral  SpO2: 93% 98%  99%  Weight:   89.8 kg   Height:        Intake/Output Summary (Last 24 hours) at 05/26/2022 1002 Last data filed at 05/26/2022 0808 Gross per 24 hour  Intake 2820.29 ml  Output 2250 ml  Net 570.29 ml       05/26/2022    3:19 AM 05/25/2022    5:24 AM 05/24/2022    5:58 AM  Last 3 Weights  Weight (lbs) 197 lb 15.6 oz 197 lb 15.6 oz 190 lb 4.1 oz  Weight (kg) 89.8 kg 89.8 kg 86.3 kg      Telemetry    Atrial flutter-personally reviewed  ECG    None new  Physical Exam   GEN: Well nourished, well developed, in no acute distress  HEENT: normal  Neck: no JVD, carotid bruits, or masses Cardiac: RRR; no murmurs, rubs, or gallops,no edema  Respiratory:  clear to auscultation bilaterally, normal work of breathing GI: soft, nontender,  nondistended, + BS MS: no deformity or atrophy  Skin: warm and dry Neuro:  Strength and sensation are intact Psych: euthymic mood, full affect   Labs    High Sensitivity Troponin:   Recent Labs  Lab 05/06/22 1850 05/06/22 2110 05/20/22 1532 05/20/22 1820  TROPONINIHS 28* 28* 14 12      Chemistry Recent Labs  Lab 05/21/22 0113 05/22/22 0118 05/23/22 0139 05/24/22 0051 05/24/22 0701 05/26/22 0113  NA 137 139 139 137  --  139  K 3.6 3.7 3.8 5.7* 4.0 3.9  CL 107 104 106 103  --  107  CO2 22 25 23 28   --  22  GLUCOSE 112* 93 95 86  --  93  BUN 15 21 19 23   --  21  CREATININE 1.17 1.39* 1.41* 1.51*  --  1.59*  CALCIUM 8.5* 8.5* 8.4* 8.7*  --  8.2*  MG 1.8 2.2 2.1  --   --   --   PROT 5.8* 5.6* 5.6*  --   --   --   ALBUMIN 2.7* 2.7* 2.7*  --   --   --  AST 19 17 19   --   --   --   ALT 20 20 21   --   --   --   ALKPHOS 73 83 92  --   --   --   BILITOT 1.3* 1.2 1.3*  --   --   --   GFRNONAA >60 55* 54* 50*  --  47*  ANIONGAP 8 10 10 6   --  10     Lipids  Recent Labs  Lab 05/24/22 0051  CHOL 139  TRIG 182*  HDL 27*  LDLCALC 76  CHOLHDL 5.1     Hematology Recent Labs  Lab 05/22/22 0118 05/23/22 0139 05/26/22 0113  WBC 12.0* 10.9* 10.8*  RBC 4.27 4.22 4.34  HGB 12.4* 12.4* 12.6*  HCT 36.4* 35.4* 38.3*  MCV 85.2 83.9 88.2  MCH 29.0 29.4 29.0  MCHC 34.1 35.0 32.9  RDW 13.2 13.0 13.3  PLT 244 246 241    Thyroid No results for input(s): "TSH", "FREET4" in the last 168 hours.  BNPNo results for input(s): "BNP", "PROBNP" in the last 168 hours.  DDimer No results for input(s): "DDIMER" in the last 168 hours.   Radiology    No results found.  Cardiac Studies   05/10/22 TEE/Cardioversion  IMPRESSIONS    1. Successful TEE/DCCV with return to NSR. No LAA thrombus. LVEF 20-25%.  Global HK.   2. Left ventricular ejection fraction, by estimation, is 20 to 25%. The  left ventricle has severely decreased function.   3. Right ventricular systolic  function is mildly reduced. The right  ventricular size is normal.   4. Left atrial size was moderately dilated. No left atrial/left atrial  appendage thrombus was detected. The LAA emptying velocity was 39 cm/s.   5. Right atrial size was mildly dilated.   6. The mitral valve is grossly normal. Mild mitral valve regurgitation.  No evidence of mitral stenosis.   7. The aortic valve is tricuspid. Aortic valve regurgitation is not  visualized. No aortic stenosis is present.   8. There is Moderate (Grade III) layered plaque involving the descending  aorta.   FINDINGS   Left Ventricle: Left ventricular ejection fraction, by estimation, is 20  to 25%. The left ventricle has severely decreased function. The left  ventricular internal cavity size was normal in size.   Right Ventricle: The right ventricular size is normal. No increase in  right ventricular wall thickness. Right ventricular systolic function is  mildly reduced.   Left Atrium: Left atrial size was moderately dilated. No left atrial/left  atrial appendage thrombus was detected. The LAA emptying velocity was 39  cm/s.   Right Atrium: Right atrial size was mildly dilated.   Pericardium: There is no evidence of pericardial effusion.   Mitral Valve: The mitral valve is grossly normal. Mild mitral valve  regurgitation. No evidence of mitral valve stenosis.   Tricuspid Valve: The tricuspid valve is grossly normal. Tricuspid valve  regurgitation is trivial. No evidence of tricuspid stenosis.   Aortic Valve: The aortic valve is tricuspid. Aortic valve regurgitation is  not visualized. No aortic stenosis is present.   Pulmonic Valve: The pulmonic valve was grossly normal. Pulmonic valve  regurgitation is trivial. No evidence of pulmonic stenosis.   Aorta: The aortic root and ascending aorta are structurally normal, with  no evidence of dilitation. There is moderate (Grade III) layered plaque  involving the descending aorta.    Venous: The left upper pulmonary vein, left lower pulmonary vein,  right  lower pulmonary vein and right upper pulmonary vein are normal.   IAS/Shunts: No atrial level shunt detected by color flow Doppler.   Patient Profile     72 y.o. 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 He was recently admitted on 05/06/2022 for altered mental status and incidentally found to be in new onset rapid atrial flutter. He underwent successful TEE/ DCCV at that time but left AMA before medications could be optimized. He was readmitted on 05/20/2022 for worsening altered mental status and was found to be back in atrial flutter with RVR. Cardiology consulted for further evaluation.   Assessment & Plan    1.  Typical atrial flutter: Currently on amiodarone and Xarelto.  Remains in atrial flutter but with rates improved and no further pauses.  Samina Weekes continue with current management.  Tentative plans for TEE and cardioversion on Monday.  2.  Coronary artery disease: Status post LAD PCI in 2001.  Continue Plavix and Crestor.  3.  Chronic systolic heart failure: Due to ischemic cardiomyopathy.  Euvolemic on exam.  Continue current medications.  4.  Hypertension: Currently well-controlled  5.  Hyperlipidemia: Continue Crestor.  6.  CKD stage III: Creatinine appears at baseline.  Plan per primary team.        For questions or updates, please contact [place] Please consult [email or web address] for contact info under

## 2022-05-27 DIAGNOSIS — I4891 Unspecified atrial fibrillation: Secondary | ICD-10-CM | POA: Diagnosis not present

## 2022-05-27 LAB — BASIC METABOLIC PANEL
Anion gap: 8 (ref 5–15)
BUN: 14 mg/dL (ref 8–23)
CO2: 23 mmol/L (ref 22–32)
Calcium: 8.2 mg/dL — ABNORMAL LOW (ref 8.9–10.3)
Chloride: 110 mmol/L (ref 98–111)
Creatinine, Ser: 1.57 mg/dL — ABNORMAL HIGH (ref 0.61–1.24)
GFR, Estimated: 47 mL/min — ABNORMAL LOW (ref >60)
Glucose, Bld: 98 mg/dL (ref 70–99)
Potassium: 3.8 mmol/L (ref 3.5–5.1)
Sodium: 141 mmol/L (ref 135–145)

## 2022-05-27 NOTE — H&P (View-Only) (Signed)
Rounding Note    Patient Name: Brendan Butler Date of Encounter: 05/27/2022  [place] Cardiologist: [name], MD   Subjective   Remains in atrial flutter.  Ready for cardioversion tomorrow.  Inpatient Medications    Scheduled Meds:  buPROPion  300 mg Oral Daily   clopidogrel  75 mg Oral Daily   cyanocobalamin  1,000 mcg Subcutaneous Daily   Followed by   Derrill Memo ON 05/31/2022] cyanocobalamin  1,000 mcg Subcutaneous Q30 days   empagliflozin  10 mg Oral Daily   pantoprazole  40 mg Oral Daily   PARoxetine  30 mg Oral Daily   pregabalin  150 mg Oral BID   QUEtiapine  25 mg Oral QHS   rivaroxaban  20 mg Oral Q supper   rosuvastatin  20 mg Oral Daily   sacubitril-valsartan  1 tablet Oral BID   Continuous Infusions:  sodium chloride 50 mL/hr at 05/27/22 0257   amiodarone 30 mg/hr (05/27/22 0958)   PRN Meds: acetaminophen, ondansetron   Vital Signs    Vitals:   05/26/22 1928 05/26/22 2234 05/27/22 0417 05/27/22 0809  BP: 118/71 123/64 (!) 108/58 109/63  Pulse: 65 62 67 64  Resp: 15 12 12 18   Temp: 97.7 F (36.5 C) 97.7 F (36.5 C) 97.7 F (36.5 C) 98.3 F (36.8 C)  TempSrc: Oral Oral Oral Oral  SpO2: 99% 96% 92% 98%  Weight:      Height:        Intake/Output Summary (Last 24 hours) at 05/27/2022 1043 Last data filed at 05/27/2022 0809 Gross per 24 hour  Intake 2360.14 ml  Output 1950 ml  Net 410.14 ml       05/26/2022    3:19 AM 05/25/2022    5:24 AM 05/24/2022    5:58 AM  Last 3 Weights  Weight (lbs) 197 lb 15.6 oz 197 lb 15.6 oz 190 lb 4.1 oz  Weight (kg) 89.8 kg 89.8 kg 86.3 kg      Telemetry    Atrial flutter-personally reviewed  ECG    None new  Physical Exam   GEN: Well nourished, well developed, in no acute distress  HEENT: normal  Neck: no JVD, carotid bruits, or masses Cardiac: irregular; no murmurs, rubs, or gallops,no edema  Respiratory:  clear to auscultation bilaterally, normal work of breathing GI: soft,  nontender, nondistended, + BS MS: no deformity or atrophy  Skin: warm and dry Neuro:  Strength and sensation are intact Psych: euthymic mood, full affect   Labs    High Sensitivity Troponin:   Recent Labs  Lab 05/06/22 1850 05/06/22 2110 05/20/22 1532 05/20/22 1820  TROPONINIHS 28* 28* 14 12      Chemistry Recent Labs  Lab 05/21/22 0113 05/22/22 0118 05/23/22 0139 05/24/22 0051 05/24/22 0701 05/26/22 0113 05/27/22 0115  NA 137 139 139 137  --  139 141  K 3.6 3.7 3.8 5.7* 4.0 3.9 3.8  CL 107 104 106 103  --  107 110  CO2 22 25 23 28   --  22 23  GLUCOSE 112* 93 95 86  --  93 98  BUN 15 21 19 23   --  21 14  CREATININE 1.17 1.39* 1.41* 1.51*  --  1.59* 1.57*  CALCIUM 8.5* 8.5* 8.4* 8.7*  --  8.2* 8.2*  MG 1.8 2.2 2.1  --   --   --   --   PROT 5.8* 5.6* 5.6*  --   --   --   --  ALBUMIN 2.7* 2.7* 2.7*  --   --   --   --   AST 19 17 19   --   --   --   --   ALT 20 20 21   --   --   --   --   ALKPHOS 73 83 92  --   --   --   --   BILITOT 1.3* 1.2 1.3*  --   --   --   --   GFRNONAA >60 55* 54* 50*  --  47* 47*  ANIONGAP 8 10 10 6   --  10 8     Lipids  Recent Labs  Lab 05/24/22 0051  CHOL 139  TRIG 182*  HDL 27*  LDLCALC 76  CHOLHDL 5.1     Hematology Recent Labs  Lab 05/22/22 0118 05/23/22 0139 05/26/22 0113  WBC 12.0* 10.9* 10.8*  RBC 4.27 4.22 4.34  HGB 12.4* 12.4* 12.6*  HCT 36.4* 35.4* 38.3*  MCV 85.2 83.9 88.2  MCH 29.0 29.4 29.0  MCHC 34.1 35.0 32.9  RDW 13.2 13.0 13.3  PLT 244 246 241    Thyroid No results for input(s): "TSH", "FREET4" in the last 168 hours.  BNPNo results for input(s): "BNP", "PROBNP" in the last 168 hours.  DDimer No results for input(s): "DDIMER" in the last 168 hours.   Radiology    No results found.  Cardiac Studies   05/10/22 TEE/Cardioversion  IMPRESSIONS    1. Successful TEE/DCCV with return to NSR. No LAA thrombus. LVEF 20-25%.  Global HK.   2. Left ventricular ejection fraction, by estimation, is 20 to  25%. The  left ventricle has severely decreased function.   3. Right ventricular systolic function is mildly reduced. The right  ventricular size is normal.   4. Left atrial size was moderately dilated. No left atrial/left atrial  appendage thrombus was detected. The LAA emptying velocity was 39 cm/s.   5. Right atrial size was mildly dilated.   6. The mitral valve is grossly normal. Mild mitral valve regurgitation.  No evidence of mitral stenosis.   7. The aortic valve is tricuspid. Aortic valve regurgitation is not  visualized. No aortic stenosis is present.   8. There is Moderate (Grade III) layered plaque involving the descending  aorta.   FINDINGS   Left Ventricle: Left ventricular ejection fraction, by estimation, is 20  to 25%. The left ventricle has severely decreased function. The left  ventricular internal cavity size was normal in size.   Right Ventricle: The right ventricular size is normal. No increase in  right ventricular wall thickness. Right ventricular systolic function is  mildly reduced.   Left Atrium: Left atrial size was moderately dilated. No left atrial/left  atrial appendage thrombus was detected. The LAA emptying velocity was 39  cm/s.   Right Atrium: Right atrial size was mildly dilated.   Pericardium: There is no evidence of pericardial effusion.   Mitral Valve: The mitral valve is grossly normal. Mild mitral valve  regurgitation. No evidence of mitral valve stenosis.   Tricuspid Valve: The tricuspid valve is grossly normal. Tricuspid valve  regurgitation is trivial. No evidence of tricuspid stenosis.   Aortic Valve: The aortic valve is tricuspid. Aortic valve regurgitation is  not visualized. No aortic stenosis is present.   Pulmonic Valve: The pulmonic valve was grossly normal. Pulmonic valve  regurgitation is trivial. No evidence of pulmonic stenosis.   Aorta: The aortic root and ascending aorta are structurally normal,  with  no evidence of  dilitation. There is moderate (Grade III) layered plaque  involving the descending aorta.   Venous: The left upper pulmonary vein, left lower pulmonary vein, right  lower pulmonary vein and right upper pulmonary vein are normal.   IAS/Shunts: No atrial level shunt detected by color flow Doppler.   Patient Profile     72 y.o. 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 He was recently admitted on 05/06/2022 for altered mental status and incidentally found to be in new onset rapid atrial flutter. He underwent successful TEE/ DCCV at that time but left AMA before medications could be optimized. He was readmitted on 05/20/2022 for worsening altered mental status and was found to be back in atrial flutter with RVR. Cardiology consulted for further evaluation.   Assessment & Plan    1.  Typical atrial flutter: Currently on amiodarone and Xarelto.  Remains in atrial flutter but with improved rates.  Yuliya Nova plan for TEE and cardioversion tomorrow.  2.  Coronary artery disease: Status post LAD PCI in 2001.  Continue Plavix and Crestor  3.  Chronic systolic heart failure: Due to ischemic cardiomyopathy.  Euvolemic on exam.  Continue with current management.  4.  Hypertension: Currently well-controlled  5.  CKD stage III: Creatinine appears at baseline.  Plan per primary team.      For questions or updates, please contact Home Please consult [email or web address] for contact info under

## 2022-05-27 NOTE — Progress Notes (Signed)
PROGRESS NOTE    Brendan Butler  [id number] DOB: 02/12/1951 DOA: 05/20/2022 PCP: Wenda Low, MD   Chief Complaint  Patient presents with   Weakness    Brief Narrative:   Brendan Butler is a 72 y.o. male with PMH significant for CAD, HLD, ED, OSA not on CPAP, HTN admitted with generalized weakness and falls. He was just discharged from the hospital about 10 days ago when he was admitted with Afibb with RVR and aflutter and cardioverted. Echo with reduced EF. He was discharged but inconsistent medication adherence and reportedly too weak to really get out of bed and not eating much and numerous falls , he was seen by cardiology, his medication has been adjusted.  For which heart rate has been controlled on Toprol-XL, but he had few pauses, for which his Toprol-XL has been discontinued, discharged on 3/28 has been held regarding that, has been evaluated by EP cardiology.  Assessment & Plan:   Principal Problem:   Atrial fibrillation with RVR (HCC) Active Problems:   Delirium due to another medical condition   Falls frequently   Persistent A-fib with RVR Patient was recently admitted earlier this month for altered mental status and incidentally found to have new onset atrial flutter with RVR. He underwent successful TEE/ DCCV with restoration of sinus rhythm. TEE showed EF had dropped from 55-60% in 11/2021 to 20-25%. He left AMA before medications could be optimized. He re-presented on 05/20/2022 with worsening mental status and was back in atrial flutter with RVR. Rates were initially in the 130s.  He was initially started on IV Diltiazem with improvement in rates. IV Diltaizem was switched to IV Amiodarone given reduced EF but Amiodarone has now been discontinued as well given concern about long-term Amiodarone use in setting of medication non-compliance.  -Patient has been adjusted by cardiology, Eliquis has been changed to Xarelto, Lopressor has been changed to  Toprol-XL. -Rate overall has been controlled, but patient did develop few pauses, for which his beta-blockers has been held, and EP has been consulted with plans for cardioversion on Monday,.   CAD - Continue Plavix 75mg  daily.  - Continue high intensity statin.   Chronic CHmrEF Ischemic Cardiomyopathy EF 35-40% in 12/2012 in the setting of an anterior MI.  However, EF later normalized.  TTE 11/2021 showed LVEF of 55-60% with hypokinesis of the entire anterior septum (suggestive of prior anteroseptal/LAD infarct) grade 1 diastolic dysfunction. However, recent TEE earlier during admission earlier this month showed new drop in EF to 20-25%. This was felt to likely be tachy-mediated in setting of rapid atrial flutter.  -Medication has been adjusted by cardiology, continue with Ferne Coe,   Hypertension - well-controlled, continue with medication for CHF as above   Hyperlipidemia  - continue with high intensity statin   CKD stage III -Renal function stable  Confusion and acute metabolic encephalopathy due to delirium with likely progressive Cognitive Decline -Patient back to baseline, EEG and MRI nonrevealing -He is with known B12 deficiency, will replete, monitor liver closely as an outpatient     B12 deficiency - Please see above discussion  Depression -continue bupropion, Paxil, Seroquel   Falls with multiple fractures Displaced Ribs -Having multiple falls and could be in the setting of his a flutter -Continue with flutter valve and incentive spirometry -PT OT recommending SNF    DVT prophylaxis: Xarelto Code Status: Full code  Family Communication: none at bedside today, D/W daughter by phone yesterday Disposition:   Status is: Inpatient  Consultants:  Cardiology EP cards   Subjective: Patient denies symptoms this morning.  He was started on amiodarone and remains in atrial flutter Objective: Vitals:   05/26/22 2234 05/27/22 0417 05/27/22 0809  05/27/22 1253  BP: 123/64 (!) 108/58 109/63 132/70  Pulse: 62 67 64 76  Resp: 12 12 18 16   Temp: 97.7 F (36.5 C) 97.7 F (36.5 C) 98.3 F (36.8 C) 97.6 F (36.4 C)  TempSrc: Oral Oral Oral Oral  SpO2: 96% 92% 98% 98%  Weight:      Height:        Intake/Output Summary (Last 24 hours) at 05/27/2022 1300 Last data filed at 05/27/2022 1254 Gross per 24 hour  Intake 2722.94 ml  Output 2750 ml  Net -27.06 ml    Filed Weights   05/24/22 0558 05/25/22 0524 05/26/22 0319  Weight: 86.3 kg 89.8 kg 89.8 kg    Examination:  Physical Exam Constitutional:      Appearance: He is normal weight.  Cardiovascular:     Rate and Rhythm: Rhythm irregular.     Pulses: Normal pulses.  Pulmonary:     Effort: Pulmonary effort is normal.     Breath sounds: Normal breath sounds.  Abdominal:     General: Abdomen is flat.     Palpations: Abdomen is soft.  Skin:    General: Skin is warm.  Neurological:     General: No focal deficit present.     Mental Status: He is alert and oriented to person, place, and time.         Data Reviewed: I have personally reviewed following labs and imaging studies  CBC: Recent Labs  Lab 05/20/22 1532 05/21/22 0113 05/22/22 0118 05/23/22 0139 05/26/22 0113  WBC 9.1 9.8 12.0* 10.9* 10.8*  NEUTROABS  --   --  6.8 5.5  --   HGB 12.8* 13.0 12.4* 12.4* 12.6*  HCT 38.9* 38.8* 36.4* 35.4* 38.3*  MCV 86.6 86.2 85.2 83.9 88.2  PLT 241 242 244 246 241     Basic Metabolic Panel: Recent Labs  Lab 05/21/22 0113 05/22/22 0118 05/23/22 0139 05/24/22 0051 05/24/22 0701 05/26/22 0113 05/27/22 0115  NA 137 139 139 137  --  139 141  K 3.6 3.7 3.8 5.7* 4.0 3.9 3.8  CL 107 104 106 103  --  107 110  CO2 22 25 23 28   --  22 23  GLUCOSE 112* 93 95 86  --  93 98  BUN 15 21 19 23   --  21 14  CREATININE 1.17 1.39* 1.41* 1.51*  --  1.59* 1.57*  CALCIUM 8.5* 8.5* 8.4* 8.7*  --  8.2* 8.2*  MG 1.8 2.2 2.1  --   --   --   --   PHOS 3.7 3.4 3.5  --   --   --    --      GFR: Estimated Creatinine Clearance: 50.1 mL/min (A) (by C-G formula based on SCr of 1.57 mg/dL (H)).  Liver Function Tests: Recent Labs  Lab 05/20/22 1532 05/21/22 0113 05/22/22 0118 05/23/22 0139  AST 18 19 17 19   ALT 19 20 20 21   ALKPHOS 84 73 83 92  BILITOT 1.4* 1.3* 1.2 1.3*  PROT 6.1* 5.8* 5.6* 5.6*  ALBUMIN 2.7* 2.7* 2.7* 2.7*     CBG: Recent Labs  Lab 05/21/22 0628  GLUCAP 109*      No results found for this or any previous visit (from the past 240 hour(s)).  Radiology Studies: No results found.      Scheduled Meds:  buPROPion  300 mg Oral Daily   clopidogrel  75 mg Oral Daily   cyanocobalamin  1,000 mcg Subcutaneous Daily   Followed by   Derrill Memo ON 05/31/2022] cyanocobalamin  1,000 mcg Subcutaneous Q30 days   empagliflozin  10 mg Oral Daily   pantoprazole  40 mg Oral Daily   PARoxetine  30 mg Oral Daily   pregabalin  150 mg Oral BID   QUEtiapine  25 mg Oral QHS   rivaroxaban  20 mg Oral Q supper   rosuvastatin  20 mg Oral Daily   sacubitril-valsartan  1 tablet Oral BID   Continuous Infusions:  sodium chloride 50 mL/hr at 05/27/22 0257   amiodarone 30 mg/hr (05/27/22 0958)     LOS: 6 days     Time spent 24 minutes Emilee Hero, MD [facility]  Please secure chat  05/27/2022, 1:00 PM

## 2022-05-27 NOTE — Progress Notes (Signed)
Rounding Note    Patient Name: Brendan Butler Date of Encounter: 05/27/2022  [place] Cardiologist: [name], MD   Subjective   Remains in atrial flutter.  Ready for cardioversion tomorrow.  Inpatient Medications    Scheduled Meds:  buPROPion  300 mg Oral Daily   clopidogrel  75 mg Oral Daily   cyanocobalamin  1,000 mcg Subcutaneous Daily   Followed by   Derrill Memo ON 05/31/2022] cyanocobalamin  1,000 mcg Subcutaneous Q30 days   empagliflozin  10 mg Oral Daily   pantoprazole  40 mg Oral Daily   PARoxetine  30 mg Oral Daily   pregabalin  150 mg Oral BID   QUEtiapine  25 mg Oral QHS   rivaroxaban  20 mg Oral Q supper   rosuvastatin  20 mg Oral Daily   sacubitril-valsartan  1 tablet Oral BID   Continuous Infusions:  sodium chloride 50 mL/hr at 05/27/22 0257   amiodarone 30 mg/hr (05/27/22 0958)   PRN Meds: acetaminophen, ondansetron   Vital Signs    Vitals:   05/26/22 1928 05/26/22 2234 05/27/22 0417 05/27/22 0809  BP: 118/71 123/64 (!) 108/58 109/63  Pulse: 65 62 67 64  Resp: 15 12 12 18   Temp: 97.7 F (36.5 C) 97.7 F (36.5 C) 97.7 F (36.5 C) 98.3 F (36.8 C)  TempSrc: Oral Oral Oral Oral  SpO2: 99% 96% 92% 98%  Weight:      Height:        Intake/Output Summary (Last 24 hours) at 05/27/2022 1043 Last data filed at 05/27/2022 0809 Gross per 24 hour  Intake 2360.14 ml  Output 1950 ml  Net 410.14 ml       05/26/2022    3:19 AM 05/25/2022    5:24 AM 05/24/2022    5:58 AM  Last 3 Weights  Weight (lbs) 197 lb 15.6 oz 197 lb 15.6 oz 190 lb 4.1 oz  Weight (kg) 89.8 kg 89.8 kg 86.3 kg      Telemetry    Atrial flutter-personally reviewed  ECG    None new  Physical Exam   GEN: Well nourished, well developed, in no acute distress  HEENT: normal  Neck: no JVD, carotid bruits, or masses Cardiac: irregular; no murmurs, rubs, or gallops,no edema  Respiratory:  clear to auscultation bilaterally, normal work of breathing GI: soft,  nontender, nondistended, + BS MS: no deformity or atrophy  Skin: warm and dry Neuro:  Strength and sensation are intact Psych: euthymic mood, full affect   Labs    High Sensitivity Troponin:   Recent Labs  Lab 05/06/22 1850 05/06/22 2110 05/20/22 1532 05/20/22 1820  TROPONINIHS 28* 28* 14 12      Chemistry Recent Labs  Lab 05/21/22 0113 05/22/22 0118 05/23/22 0139 05/24/22 0051 05/24/22 0701 05/26/22 0113 05/27/22 0115  NA 137 139 139 137  --  139 141  K 3.6 3.7 3.8 5.7* 4.0 3.9 3.8  CL 107 104 106 103  --  107 110  CO2 22 25 23 28   --  22 23  GLUCOSE 112* 93 95 86  --  93 98  BUN 15 21 19 23   --  21 14  CREATININE 1.17 1.39* 1.41* 1.51*  --  1.59* 1.57*  CALCIUM 8.5* 8.5* 8.4* 8.7*  --  8.2* 8.2*  MG 1.8 2.2 2.1  --   --   --   --   PROT 5.8* 5.6* 5.6*  --   --   --   --  ALBUMIN 2.7* 2.7* 2.7*  --   --   --   --   AST 19 17 19   --   --   --   --   ALT 20 20 21   --   --   --   --   ALKPHOS 73 83 92  --   --   --   --   BILITOT 1.3* 1.2 1.3*  --   --   --   --   GFRNONAA >60 55* 54* 50*  --  47* 47*  ANIONGAP 8 10 10 6   --  10 8     Lipids  Recent Labs  Lab 05/24/22 0051  CHOL 139  TRIG 182*  HDL 27*  LDLCALC 76  CHOLHDL 5.1     Hematology Recent Labs  Lab 05/22/22 0118 05/23/22 0139 05/26/22 0113  WBC 12.0* 10.9* 10.8*  RBC 4.27 4.22 4.34  HGB 12.4* 12.4* 12.6*  HCT 36.4* 35.4* 38.3*  MCV 85.2 83.9 88.2  MCH 29.0 29.4 29.0  MCHC 34.1 35.0 32.9  RDW 13.2 13.0 13.3  PLT 244 246 241    Thyroid No results for input(s): "TSH", "FREET4" in the last 168 hours.  BNPNo results for input(s): "BNP", "PROBNP" in the last 168 hours.  DDimer No results for input(s): "DDIMER" in the last 168 hours.   Radiology    No results found.  Cardiac Studies   05/10/22 TEE/Cardioversion  IMPRESSIONS    1. Successful TEE/DCCV with return to NSR. No LAA thrombus. LVEF 20-25%.  Global HK.   2. Left ventricular ejection fraction, by estimation, is 20 to  25%. The  left ventricle has severely decreased function.   3. Right ventricular systolic function is mildly reduced. The right  ventricular size is normal.   4. Left atrial size was moderately dilated. No left atrial/left atrial  appendage thrombus was detected. The LAA emptying velocity was 39 cm/s.   5. Right atrial size was mildly dilated.   6. The mitral valve is grossly normal. Mild mitral valve regurgitation.  No evidence of mitral stenosis.   7. The aortic valve is tricuspid. Aortic valve regurgitation is not  visualized. No aortic stenosis is present.   8. There is Moderate (Grade III) layered plaque involving the descending  aorta.   FINDINGS   Left Ventricle: Left ventricular ejection fraction, by estimation, is 20  to 25%. The left ventricle has severely decreased function. The left  ventricular internal cavity size was normal in size.   Right Ventricle: The right ventricular size is normal. No increase in  right ventricular wall thickness. Right ventricular systolic function is  mildly reduced.   Left Atrium: Left atrial size was moderately dilated. No left atrial/left  atrial appendage thrombus was detected. The LAA emptying velocity was 39  cm/s.   Right Atrium: Right atrial size was mildly dilated.   Pericardium: There is no evidence of pericardial effusion.   Mitral Valve: The mitral valve is grossly normal. Mild mitral valve  regurgitation. No evidence of mitral valve stenosis.   Tricuspid Valve: The tricuspid valve is grossly normal. Tricuspid valve  regurgitation is trivial. No evidence of tricuspid stenosis.   Aortic Valve: The aortic valve is tricuspid. Aortic valve regurgitation is  not visualized. No aortic stenosis is present.   Pulmonic Valve: The pulmonic valve was grossly normal. Pulmonic valve  regurgitation is trivial. No evidence of pulmonic stenosis.   Aorta: The aortic root and ascending aorta are structurally normal,  with  no evidence of  dilitation. There is moderate (Grade III) layered plaque  involving the descending aorta.   Venous: The left upper pulmonary vein, left lower pulmonary vein, right  lower pulmonary vein and right upper pulmonary vein are normal.   IAS/Shunts: No atrial level shunt detected by color flow Doppler.   Patient Profile     72 y.o. 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 He was recently admitted on 05/06/2022 for altered mental status and incidentally found to be in new onset rapid atrial flutter. He underwent successful TEE/ DCCV at that time but left AMA before medications could be optimized. He was readmitted on 05/20/2022 for worsening altered mental status and was found to be back in atrial flutter with RVR. Cardiology consulted for further evaluation.   Assessment & Plan    1.  Typical atrial flutter: Currently on amiodarone and Xarelto.  Remains in atrial flutter but with improved rates.  Brendan Butler plan for TEE and cardioversion tomorrow.  2.  Coronary artery disease: Status post LAD PCI in 2001.  Continue Plavix and Crestor  3.  Chronic systolic heart failure: Due to ischemic cardiomyopathy.  Euvolemic on exam.  Continue with current management.  4.  Hypertension: Currently well-controlled  5.  CKD stage III: Creatinine appears at baseline.  Plan per primary team.      For questions or updates, please contact [place] Please consult [email or web address] for contact info under

## 2022-05-27 NOTE — Consult Note (Signed)
WOC Nurse Consult Note: Reason for Consult:Partial thickness wound noted to RGT at posterior aspect of toe during bath. Wound type:partial thickness Pressure Injury POA: N/A Measurement:0.25 x 1.25cm x 0cm Wound [id number], red Drainage (amount, consistency, odor) scant serosanguinous Periwound:  intact Dressing procedure/placement/frequency: Conservative care is implemented using a NS cleanse followed by dressing with a thin layer of bacitracin ointment. Expect reepithelialization in 7-10 days. If no improvement, consider consultation with podiatry.  [facility] nursing team will not follow, but will remain available to this patient, the nursing and medical teams.  Please re-consult if needed.  Thank you for inviting [place] to participate in this patient's Plan of Care.  Maudie Flakes, MSN, RN, CNS, [place], Serita Grammes, [employer], [employer] phone:  [phone]

## 2022-05-27 NOTE — Progress Notes (Signed)
Occupational Therapy Treatment Patient Details Name: Brendan Butler MRN: [id number] DOB: 09-07-1950 Today's Date: 05/27/2022   History of present illness Pt is a 72 y.o. male who presented from home to the ED with AMS and falls. CT revealed multi R rib fxs. He had a recent hospitalization 3/10-3/14 with similar presentation and diagnosed with a fib with RVR. PMH: HTN, hyperlipidemia, CAD, CHF, obstructive sleep apnea.   OT comments  Patient demonstrating good gains with OT treatment. Patient able to get to EOB with min assist and stood at sink for grooming and UB bathing with min assist for balance. Patient able to doff and donn socks while seated with supervision. Patient to continue to be followed by acute OT to address grooming, LB dressing, and toilet transfers.    Recommendations for follow up therapy are one component of a multi-disciplinary discharge planning process, led by the attending physician.  Recommendations may be updated based on patient status, additional functional criteria and insurance authorization.    Assistance Recommended at Discharge Frequent or constant Supervision/Assistance  Patient can return home with the following  Assist for transportation;Assistance with cooking/housework;Direct supervision/assist for financial management;Direct supervision/assist for medications management;A little help with bathing/dressing/bathroom;A little help with walking and/or transfers   Equipment Recommendations  None recommended by OT    Recommendations for Other Services      Precautions / Restrictions Precautions Precautions: Fall Precaution Comments: orthostatic hypotension symptoms 3/28-3/29 after ~3-4 mins standing Restrictions Weight Bearing Restrictions: No       Mobility Bed Mobility Overal bed mobility: Needs Assistance Bed Mobility: Supine to Sit     Supine to sit: Min assist, HOB elevated     General bed mobility comments: instructions on rail use and  min assist to scoot towards EOB    Transfers Overall transfer level: Needs assistance Equipment used: Rolling walker (2 wheels) Transfers: Sit to/from Stand Sit to Stand: Min assist, Min guard           General transfer comment: patient ambulated from EOB to sink and from sink to recliner with cues for safety and min assist to min guard assist     Balance Overall balance assessment: Needs assistance Sitting-balance support: Single extremity supported, Feet supported Sitting balance-Leahy Scale: Good     Standing balance support: Single extremity supported, Bilateral upper extremity supported, Reliant on assistive device for balance Standing balance-Leahy Scale: Fair Standing balance comment: able to stand at sink for grooming tasks with min guard assist for safety                           ADL either performed or assessed with clinical judgement   ADL Overall ADL's : Needs assistance/impaired     Grooming: Wash/dry hands;Wash/dry face;Oral care;Min guard;Standing Grooming Details (indicate cue type and reason): at sink Upper Body Bathing: Min guard;Standing   Lower Body Bathing: Sitting/lateral leans;Supervison/ safety       Lower Body Dressing: Supervision/safety;Sit to/from stand Lower Body Dressing Details (indicate cue type and reason): changed socks               General ADL Comments: stood for grooming and UB bathing without seated rest break. Discussed energy conservation during self care    Extremity/Trunk Assessment              Vision       Perception     Praxis      Cognition Arousal/Alertness: Awake/alert Behavior During Therapy: [facility]  for tasks assessed/performed Overall Cognitive Status: Within Functional Limits for tasks assessed Area of Impairment: Following commands, Memory                     Memory: Decreased short-term memory, Decreased recall of precautions Following Commands: Follows one step commands with  increased time Safety/Judgement: Decreased awareness of safety   Problem Solving: Difficulty sequencing, Requires verbal cues          Exercises      Shoulder Instructions       General Comments BP seated 117/93 while standing 106/64    Pertinent Vitals/ Pain       Pain Assessment Pain Assessment: No/denies pain Pain Intervention(s): Monitored during session  Home Living                                          Prior Functioning/Environment              Frequency  Min 2X/week        Progress Toward Goals  OT Goals(current goals can now be found in the care plan section)  Progress towards OT goals: Progressing toward goals  Acute Rehab OT Goals Patient Stated Goal: go home OT Goal Formulation: With patient Time For Goal Achievement: 06/04/22 Potential to Achieve Goals: Good ADL Goals Pt Will Perform Grooming: Independently;standing Pt Will Perform Lower Body Dressing: Independently;sit to/from stand Pt Will Transfer to Toilet: Independently;ambulating;regular height toilet  Plan Discharge plan remains appropriate    Co-evaluation                 AM-PAC OT "6 Clicks" Daily Activity     Outcome Measure   Help from another person eating meals?: None Help from another person taking care of personal grooming?: A Little Help from another person toileting, which includes using toliet, bedpan, or urinal?: A Little Help from another person bathing (including washing, rinsing, drying)?: A Little Help from another person to put on and taking off regular upper body clothing?: None Help from another person to put on and taking off regular lower body clothing?: A Little 6 Click Score: 20    End of Session Equipment Utilized During Treatment: Gait belt;Rolling walker (2 wheels)  OT Visit Diagnosis: Unsteadiness on feet (R26.81);Other symptoms and signs involving cognitive function   Activity Tolerance Patient tolerated treatment well    Patient Left in chair;with call bell/phone within reach;with chair alarm set   Nurse Communication Mobility status        Time: [id number] OT Time Calculation (min): 29 min  Charges: OT General Charges $OT Visit: 1 Visit OT Treatments $Self Care/Home Management : 8-22 mins $Therapeutic Activity: 8-22 mins  Lodema Hong, [place]  Office [phone]   Trixie Dredge 05/27/2022, 12:36 PM

## 2022-05-28 ENCOUNTER — Encounter (HOSPITAL_COMMUNITY)
Admission: EM | Disposition: A | Discharge status: Home Health | Payer: Self-pay | Source: Home / Self Care | Attending: Internal Medicine

## 2022-05-28 ENCOUNTER — Inpatient Hospital Stay (HOSPITAL_COMMUNITY): Payer: Medicare HMO | Admitting: Anesthesiology

## 2022-05-28 ENCOUNTER — Inpatient Hospital Stay (HOSPITAL_COMMUNITY): Payer: Medicare HMO

## 2022-05-28 DIAGNOSIS — Z955 Presence of coronary angioplasty implant and graft: Secondary | ICD-10-CM

## 2022-05-28 DIAGNOSIS — I2510 Atherosclerotic heart disease of native coronary artery without angina pectoris: Secondary | ICD-10-CM

## 2022-05-28 DIAGNOSIS — I252 Old myocardial infarction: Secondary | ICD-10-CM | POA: Diagnosis not present

## 2022-05-28 DIAGNOSIS — F05 Delirium due to known physiological condition: Secondary | ICD-10-CM | POA: Diagnosis not present

## 2022-05-28 DIAGNOSIS — I110 Hypertensive heart disease with heart failure: Secondary | ICD-10-CM | POA: Diagnosis not present

## 2022-05-28 DIAGNOSIS — I340 Nonrheumatic mitral (valve) insufficiency: Secondary | ICD-10-CM

## 2022-05-28 DIAGNOSIS — Z87891 Personal history of nicotine dependence: Secondary | ICD-10-CM

## 2022-05-28 DIAGNOSIS — I4891 Unspecified atrial fibrillation: Secondary | ICD-10-CM | POA: Diagnosis not present

## 2022-05-28 DIAGNOSIS — I509 Heart failure, unspecified: Secondary | ICD-10-CM

## 2022-05-28 HISTORY — PX: TEE WITHOUT CARDIOVERSION: SHX5443

## 2022-05-28 HISTORY — PX: CARDIOVERSION: SHX1299

## 2022-05-28 LAB — BASIC METABOLIC PANEL
Anion gap: 9 (ref 5–15)
BUN: 13 mg/dL (ref 8–23)
CO2: 22 mmol/L (ref 22–32)
Calcium: 8.4 mg/dL — ABNORMAL LOW (ref 8.9–10.3)
Chloride: 110 mmol/L (ref 98–111)
Creatinine, Ser: 1.55 mg/dL — ABNORMAL HIGH (ref 0.61–1.24)
GFR, Estimated: 48 mL/min — ABNORMAL LOW (ref >60)
Glucose, Bld: 108 mg/dL — ABNORMAL HIGH (ref 70–99)
Potassium: 3.8 mmol/L (ref 3.5–5.1)
Sodium: 141 mmol/L (ref 135–145)

## 2022-05-28 LAB — ECHO TEE

## 2022-05-28 LAB — PROTIME-INR
INR: 1.8 — ABNORMAL HIGH (ref 0.8–1.2)
Prothrombin Time: 21 seconds — ABNORMAL HIGH (ref 11.4–15.2)

## 2022-05-28 SURGERY — ECHOCARDIOGRAM, TRANSESOPHAGEAL
Anesthesia: General

## 2022-05-28 MED ORDER — ETOMIDATE 2 MG/ML IV SOLN
INTRAVENOUS | Status: AC
  Filled 2022-05-28: qty 10

## 2022-05-28 MED ORDER — EPHEDRINE 5 MG/ML INJ
INTRAVENOUS | Status: AC
  Filled 2022-05-28: qty 20

## 2022-05-28 MED ORDER — SENNOSIDES-DOCUSATE SODIUM 8.6-50 MG PO TABS
1.0000 | ORAL_TABLET | Freq: Every day | ORAL | Status: DC
  Administered 2022-05-28 – 2022-05-29 (×2): 1 via ORAL
  Filled 2022-05-28 (×2): qty 1

## 2022-05-28 MED ORDER — SODIUM CHLORIDE 0.9 % IV SOLN: INTRAVENOUS | Status: DC

## 2022-05-28 MED ORDER — LIDOCAINE 2% (20 MG/ML) 5 ML SYRINGE
INTRAMUSCULAR | Status: AC
  Filled 2022-05-28: qty 25

## 2022-05-28 MED ORDER — BACITRACIN ZINC 500 UNIT/GM EX OINT
TOPICAL_OINTMENT | Freq: Two times a day (BID) | CUTANEOUS | Status: DC
  Administered 2022-05-28: 31.5 via TOPICAL
  Administered 2022-05-28: 1 via TOPICAL
  Administered 2022-05-29: 31.5 via TOPICAL
  Filled 2022-05-28: qty 28.4

## 2022-05-28 MED ORDER — PHENYLEPHRINE 80 MCG/ML (10ML) SYRINGE FOR IV PUSH (FOR BLOOD PRESSURE SUPPORT): PREFILLED_SYRINGE | INTRAVENOUS | Status: DC | PRN

## 2022-05-28 MED ORDER — AMIODARONE HCL 200 MG PO TABS
200.0000 mg | ORAL_TABLET | Freq: Every day | ORAL | Status: DC
  Administered 2022-05-28 – 2022-05-29 (×2): 200 mg via ORAL
  Filled 2022-05-28 (×2): qty 1

## 2022-05-28 MED ORDER — PHENYLEPHRINE HCL-NACL 20-0.9 MG/250ML-% IV SOLN
INTRAVENOUS | Status: DC | PRN
  Administered 2022-05-28: 50 ug/min via INTRAVENOUS

## 2022-05-28 MED ORDER — PHENYLEPHRINE 80 MCG/ML (10ML) SYRINGE FOR IV PUSH (FOR BLOOD PRESSURE SUPPORT)
PREFILLED_SYRINGE | INTRAVENOUS | Status: AC
  Filled 2022-05-28: qty 30

## 2022-05-28 MED ORDER — PROPOFOL 10 MG/ML IV BOLUS
INTRAVENOUS | Status: DC | PRN
  Administered 2022-05-28 (×2): 30 mg via INTRAVENOUS
  Administered 2022-05-28: 40 mg via INTRAVENOUS

## 2022-05-28 MED ORDER — PROPOFOL 500 MG/50ML IV EMUL
INTRAVENOUS | Status: DC | PRN
  Administered 2022-05-28: 75 ug/kg/min via INTRAVENOUS

## 2022-05-28 MED ORDER — EPHEDRINE SULFATE-NACL 50-0.9 MG/10ML-% IV SOSY
PREFILLED_SYRINGE | INTRAVENOUS | Status: DC | PRN
  Administered 2022-05-28: 15 mg via INTRAVENOUS
  Administered 2022-05-28: 10 mg via INTRAVENOUS

## 2022-05-28 MED ORDER — PROPOFOL 500 MG/50ML IV EMUL
INTRAVENOUS | Status: AC
  Filled 2022-05-28: qty 50

## 2022-05-28 MED ORDER — VASOPRESSIN 20 UNIT/ML IV SOLN
INTRAVENOUS | Status: AC
  Filled 2022-05-28: qty 1

## 2022-05-28 NOTE — Progress Notes (Signed)
Rounding Note    Patient Name: Brendan Butler Date of Encounter: 05/28/2022  [place] Cardiologist: [name], MD   Subjective   No CP or dyspnea  Inpatient Medications    Scheduled Meds:  buPROPion  300 mg Oral Daily   clopidogrel  75 mg Oral Daily   cyanocobalamin  1,000 mcg Subcutaneous Daily   Followed by   Derrill Memo ON 05/31/2022] cyanocobalamin  1,000 mcg Subcutaneous Q30 days   empagliflozin  10 mg Oral Daily   pantoprazole  40 mg Oral Daily   PARoxetine  30 mg Oral Daily   pregabalin  150 mg Oral BID   QUEtiapine  25 mg Oral QHS   rivaroxaban  20 mg Oral Q supper   rosuvastatin  20 mg Oral Daily   sacubitril-valsartan  1 tablet Oral BID   Continuous Infusions:  sodium chloride 50 mL/hr at 05/28/22 0310   PRN Meds: acetaminophen, ondansetron   Vital Signs    Vitals:   05/28/22 0800 05/28/22 0810 05/28/22 0815 05/28/22 0828  BP: 112/68  129/77 (!) 147/59  Pulse: 74 67 71 71  Resp: 14 15 17 13   Temp:   97.8 F (36.6 C) 97.8 F (36.6 C)  TempSrc:    Oral  SpO2: 97% 97% 97%   Weight:      Height:        Intake/Output Summary (Last 24 hours) at 05/28/2022 0909 Last data filed at 05/28/2022 0330 Gross per 24 hour  Intake 2068.09 ml  Output 3655 ml  Net -1586.91 ml      05/28/2022    3:29 AM 05/26/2022    3:19 AM 05/25/2022    5:24 AM  Last 3 Weights  Weight (lbs) 198 lb 8 oz 197 lb 15.6 oz 197 lb 15.6 oz  Weight (kg) 90.039 kg 89.8 kg 89.8 kg      Telemetry    Atrial flutter to sinus - Personally Reviewed    Physical Exam   GEN: No acute distress.   Neck: No JVD Cardiac: RRR, no murmurs, rubs, or gallops.  Respiratory: Clear to auscultation bilaterally. GI: Soft, nontender, non-distended  MS: No edema Neuro:  Nonfocal  Psych: Normal affect   Labs    High Sensitivity Troponin:   Recent Labs  Lab 05/06/22 1850 05/06/22 2110 05/20/22 1532 05/20/22 1820  TROPONINIHS 28* 28* 14 12     Chemistry Recent Labs  Lab  05/22/22 0118 05/23/22 0139 05/24/22 0051 05/26/22 0113 05/27/22 0115 05/28/22 0053  NA 139 139   < > 139 141 141  K 3.7 3.8   < > 3.9 3.8 3.8  CL 104 106   < > 107 110 110  CO2 25 23   < > 22 23 22   GLUCOSE 93 95   < > 93 98 108*  BUN 21 19   < > 21 14 13   CREATININE 1.39* 1.41*   < > 1.59* 1.57* 1.55*  CALCIUM 8.5* 8.4*   < > 8.2* 8.2* 8.4*  MG 2.2 2.1  --   --   --   --   PROT 5.6* 5.6*  --   --   --   --   ALBUMIN 2.7* 2.7*  --   --   --   --   AST 17 19  --   --   --   --   ALT 20 21  --   --   --   --   [facility]  83 92  --   --   --   --   BILITOT 1.2 1.3*  --   --   --   --   GFRNONAA 55* 54*   < > 47* 47* 48*  ANIONGAP 10 10   < > 10 8 9    < > = values in this interval not displayed.    Lipids  Recent Labs  Lab 05/24/22 0051  CHOL 139  TRIG 182*  HDL 27*  LDLCALC 76  CHOLHDL 5.1    Hematology Recent Labs  Lab 05/22/22 0118 05/23/22 0139 05/26/22 0113  WBC 12.0* 10.9* 10.8*  RBC 4.27 4.22 4.34  HGB 12.4* 12.4* 12.6*  HCT 36.4* 35.4* 38.3*  MCV 85.2 83.9 88.2  MCH 29.0 29.4 29.0  MCHC 34.1 35.0 32.9  RDW 13.2 13.0 13.3  PLT 244 246 241      Patient Profile     72 y.o. 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 He was recently admitted on 05/06/2022 for altered mental status and incidentally found to be in new onset rapid atrial flutter. He underwent successful TEE/ DCCV at that time but left AMA before medications could be optimized. He was readmitted on 05/20/2022 for worsening altered mental status and was found to be back in atrial flutter with RVR. Cardiology consulted for further evaluation.   Assessment & Plan    1 atrial flutter-patient is now status post TEE guided cardioversion and is in sinus rhythm. Will treat with amiodarone 200 mg daily  and continue Xarelto.  2 history of pauses-no recurrences over the past 24 hours in reviewing telemetry. Avoid AV beta blockers.  3 history of coronary artery disease-given initiation of Xarelto will discontinue Plavix and continue Crestor.  4 cardiomyopathy-LV function reduced.  Possibly tachycardia mediated.  Continue Entresto.  Will avoid beta-blocker given recent pauses. Would repeat echocardiogram in 3 months to see if LV function has improved now that sinus has been reestablished.  5 Hypertension-continue present blood pressure medications.  For questions or updates, please contact [place] Please consult [email or web address] for contact info under        Signed, Kirk Ruths, MD  05/28/2022, 9:09 AM

## 2022-05-28 NOTE — Progress Notes (Cosign Needed Addendum)
Physical Therapy Treatment Patient Details Name: Brendan Butler MRN: [id number] DOB: 1951-02-05 Today's Date: 05/28/2022   History of Present Illness Pt is a 72 y.o. male who presented from home to the ED with AMS and falls. CT revealed multi R rib fxs. He had a recent hospitalization 3/10-3/14 with similar presentation and diagnosed with a fib with RVR. PMH: HTN, hyperlipidemia, CAD, CHF, obstructive sleep apnea.    PT Comments    Pt received in supine, agreeable to therapy session and with good participation and tolerance for transfer, gait and stair training. Pt with greatly improved activity tolerance, standing tolerance and balance this session. Pt able to progress to longer household distance gait trial and performed x4 steps with min guard assist. Pt still needs min safety cues and with increased processing time needed for new tasks, anticipate he is closer to his functional baseline. Pt with urinary incontinence (bed linens damp). Pt continues to benefit from PT services to progress toward functional mobility goals, disposition updated per pt progress as he is safer to go home with [facility] services as long as he has consistent supervision/assist at home for safety, discussed with supervising PT Ryan L and updated below. Pt asking if he can shower/submerge in bath with Zio patch on, RN notified he also reports it is "feeling itchy".   Recommendations for follow up therapy are one component of a multi-disciplinary discharge planning process, led by the attending physician.  Recommendations may be updated based on patient status, additional functional criteria and insurance authorization.  Follow Up Recommendations       Assistance Recommended at Discharge Frequent or constant Supervision/Assistance  Patient can return home with the following Assist for transportation;Assistance with cooking/housework;Direct supervision/assist for medications management;Help with stairs or ramp for entrance;A  little help with walking and/or transfers;A little help with bathing/dressing/bathroom   Equipment Recommendations  None recommended by PT    Recommendations for Other Services OT consult (re-assess dispo, pt wants [facility] services now)     Precautions / Restrictions Precautions Precautions: Fall Precaution Comments: orthostatic hypotension week of 3/25 Restrictions Weight Bearing Restrictions: No     Mobility  Bed Mobility Overal bed mobility: Needs Assistance Bed Mobility: Supine to Sit     Supine to sit: HOB elevated, Supervision     General bed mobility comments: no physical assist needed, min cues for initiation (to remove sheets), to sit up, pt using bed features/rail to perform.    Transfers Overall transfer level: Needs assistance Equipment used: Rolling walker (2 wheels) Transfers: Sit to/from Stand Sit to Stand: Min guard, Supervision           General transfer comment: cues needed for safe UE placement prior to sitting/standing    Ambulation/Gait Ambulation/Gait assistance: Supervision, Min guard Gait Distance (Feet): 350 Feet Assistive device: Rolling walker (2 wheels) Gait Pattern/deviations: Step-through pattern, Decreased stride length, Trunk flexed Gait velocity: fair speed, did not formally assess     General Gait Details: pt often standing with CoG outside RW and downward gaze and some carryover of cues to correct this during session. No c/o nausea or dizziness. BP checked after return to recliner and reading 154/66 (91) at that time. HR 70's bpm with exertional tasks   Stairs Stairs: Yes Stairs assistance: Min guard Stair Management: Two rails, Step to pattern, Alternating pattern, Forwards Number of Stairs: 4 General stair comments: cues for step-to pattern for safety, pt at times performing reciprocal pattern despite cues and with poor foot placement with reciprocal pattern.  Pt agreeable to try step-to pattern at home but may need  reinforcement. Pt daughter was given handout for reinforcement/safety in a previous session.   Wheelchair Mobility    Modified Rankin (Stroke Patients Only)       Balance Overall balance assessment: Needs assistance Sitting-balance support: Single extremity supported, Feet supported Sitting balance-Leahy Scale: Good     Standing balance support: Single extremity supported, Bilateral upper extremity supported, During functional activity Standing balance-Leahy Scale: Fair Standing balance comment: min guard with single UE support for safety, supervision using RW                            Cognition Arousal/Alertness: Awake/alert Behavior During Therapy: WFL for tasks assessed/performed Overall Cognitive Status: Within Functional Limits for tasks assessed Area of Impairment: Following commands, Memory                     Memory: Decreased short-term memory, Decreased recall of precautions Following Commands: Follows one step commands consistently Safety/Judgement: Decreased awareness of safety   Problem Solving: Requires verbal cues General Comments: Patient following most simple commands. Pt needed increased cues for safe stair sequencing (cued for step-to pattern but pt at times still performing reciprocal pattern and slightly unsteady appearing but no overt loss of balance). Pt bed damp and urine smell, possible baseline urinary incontinence, RN notified.        Exercises      General Comments General comments (skin integrity, edema, etc.): BP 154/66 (91) reclined in chair post-exertion, no symptoms during gait/stair training and HR WFL. SpO2 WFL on RA      Pertinent Vitals/Pain Pain Assessment Pain Assessment: No/denies pain Pain Intervention(s): Monitored during session, Repositioned     PT Goals (current goals can now be found in the care plan section) Acute Rehab PT Goals PT Goal Formulation: With patient Time For Goal Achievement:  06/04/22 Progress towards PT goals: Progressing toward goals    Frequency    Min 1X/week      PT Plan Current plan remains appropriate       AM-PAC PT "6 Clicks" Mobility   Outcome Measure  Help needed turning from your back to your side while in a flat bed without using bedrails?: None Help needed moving from lying on your back to sitting on the side of a flat bed without using bedrails?: A Little Help needed moving to and from a bed to a chair (including a wheelchair)?: A Little Help needed standing up from a chair using your arms (e.g., wheelchair or bedside chair)?: A Little Help needed to walk in hospital room?: A Little Help needed climbing 3-5 steps with a railing? : A Little 6 Click Score: 19    End of Session Equipment Utilized During Treatment: Gait belt Activity Tolerance: Patient tolerated treatment well Patient left: in chair;with call bell/phone within reach;with chair alarm set Nurse Communication: Mobility status PT Visit Diagnosis: Difficulty in walking, not elsewhere classified (R26.2);Repeated falls (R29.6)     Time: [id number] PT Time Calculation (min) (ACUTE ONLY): 20 min  Charges:  $Gait Training: 8-22 mins                     Karne Ozga P., PTA Acute Rehabilitation Services Secure Chat Preferred 9a-5:30pm Office: [place] 05/28/2022, 5:40 PM

## 2022-05-28 NOTE — Progress Notes (Signed)
Mobility Specialist: Progress Note   05/28/22 1023  Mobility  Activity Ambulated with assistance in hallway  Level of Assistance Contact guard assist, steadying assist  Assistive Device Front wheel walker  Distance Ambulated (ft) 470 ft  Activity Response Tolerated well  Mobility Referral Yes  $Mobility charge 1 Mobility   Pre-Mobility: 77 HR, 132/62 (83) BP, 98% SpO2 During Mobility: 86 HR Post-Mobility: 80 HR, 159/62 (90) BP, 99% SpO2  Pt received in the bed and agreeable to mobility. Mod I with bed mobility and contact guard during ambulation. No c/o throughout. Pt back to bed after session per request with call bell and phone at his side.   [place] Niklaus Mamaril Mobility Specialist Please contact via SecureChat or Rehab office at [phone]

## 2022-05-28 NOTE — Anesthesia Preprocedure Evaluation (Signed)
Anesthesia Evaluation  Patient identified by MRN, date of birth, ID band Patient awake    Reviewed: Allergy & Precautions, H&P , NPO status , Patient's Chart, lab work & pertinent test results  Airway Mallampati: II  TM Distance: >3 FB Neck ROM: Full    Dental no notable dental hx.    Pulmonary sleep apnea , former smoker   Pulmonary exam normal breath sounds clear to auscultation       Cardiovascular hypertension, + CAD, + Past MI, + Cardiac Stents and +CHF  Normal cardiovascular exam+ dysrhythmias Atrial Fibrillation  Rhythm:Irregular Rate:Normal     Neuro/Psych negative neurological ROS  negative psych ROS   GI/Hepatic negative GI ROS, Neg liver ROS,,,  Endo/Other  negative endocrine ROS    Renal/GU negative Renal ROS  negative genitourinary   Musculoskeletal negative musculoskeletal ROS (+)    Abdominal   Peds negative pediatric ROS (+)  Hematology negative hematology ROS (+)   Anesthesia Other Findings   Reproductive/Obstetrics negative OB ROS                             Anesthesia Physical Anesthesia Plan  ASA: 3  Anesthesia Plan: General   Post-op Pain Management: Minimal or no pain anticipated   Induction: Intravenous  PONV Risk Score and Plan: 2 and Ondansetron, Dexamethasone and Treatment may vary due to age or medical condition  Airway Management Planned: Mask  Additional Equipment:   Intra-op Plan:   Post-operative Plan:   Informed Consent: I have reviewed the patients History and Physical, chart, labs and discussed the procedure including the risks, benefits and alternatives for the proposed anesthesia with the patient or authorized representative who has indicated his/her understanding and acceptance.     Dental advisory given  Plan Discussed with: CRNA and Surgeon  Anesthesia Plan Comments: (MAC for TEE, GA for cardioversion)       Anesthesia Quick  Evaluation

## 2022-05-28 NOTE — Progress Notes (Addendum)
[facility] PROGRESS NOTE  CZESLAW MANOUKIAN (DOB: Jan 10, 1951) [id number] PCP: Wenda Low, MD  Brief Narrative: Brendan Butler is a 72 y.o. male with PMH significant for CAD, HLD, ED, OSA not on CPAP, HTN admitted with generalized weakness and falls. He was just discharged from the hospital about 10 days ago when he was admitted with Afib with RVR and aflutter and cardioverted. Echo with reduced EF. He was discharged but inconsistent medication adherence and reportedly too weak to really get out of bed and not eating much and numerous falls, he was seen by cardiology, his medication has been adjusted. Metoprolol was given with good rate control, though subsequently developed prolonged pauses. AV nodal agents have been discontinued with improvement in bradycardic rate, and the patient underwent DCCV 4/1.   Subjective: Feels great after cardioversion. Has not had BM for many days.   Objective: BP (!) 147/59 (BP Location: Right Arm)   Pulse 71   Temp 97.8 F (36.6 C) (Oral)   Resp 13   Ht 5\' 10"  (1.778 m)   Wt 90 kg   SpO2 97%   BMI 28.48 kg/m   Gen: No distress Pulm: Clear, nonlabored  CV: NSR on monitor with PAC  GI: Soft, NT, ND, +BS  Neuro: Alert and oriented. No new focal deficits. Ext: Warm, no deformities. Skin: No new rashes, lesions or ulcers on visualized skin   Assessment & Plan: Persistent A-fib with RVR:  Patient was recently admitted earlier this month for altered mental status and incidentally found to have new onset atrial flutter with RVR. He underwent successful TEE/ DCCV with restoration of sinus rhythm. TEE showed EF had dropped from 55-60% in 11/2021 to 20-25%. He left AMA before medications could be optimized. He re-presented on 05/20/2022 with worsening mental status and was back in atrial flutter with RVR. Rates were initially in the 130s.  He was initially started on IV Diltiazem with improvement in rates. IV Diltaizem was switched to IV Amiodarone  given reduced EF but Amiodarone has now been discontinued as well given concern about long-term Amiodarone use in setting of medication non-compliance.  - Continue xarelto - EP consulted for pauses > 5 seconds, now on amiodarone 200mg  daily and in NSR s/p TEE-DCCV 4/1.     CAD - With addition of DOAC, plavix is stopped.  - Continue high intensity statin.   Chronic CHmrEF Ischemic Cardiomyopathy EF 35-40% in 12/2012 in the setting of an anterior MI.  However, EF later normalized.  TTE 11/2021 showed LVEF of 55-60% with hypokinesis of the entire anterior septum (suggestive of prior anteroseptal/LAD infarct) grade 1 diastolic dysfunction. However, recent TEE earlier during admission earlier this month showed new drop in EF to 20-25%. This was felt to likely be tachy-mediated in setting of rapid atrial flutter.  - Continue entresto, jardiance, and rhythm control as above. Avoiding BB w/pauses.    Hypertension - well-controlled, continue with medication for CHF as above   Hyperlipidemia  - Continue high intensity statin   CKD stage IIIa: Stable  Confusion and acute metabolic encephalopathy due to delirium with likely progressive Cognitive Decline -Patient back to baseline, EEG and MRI nonrevealing   B12 deficiency - Supplement  Constipation: Abd benign - Senokot  Depression - Continue bupropion, paxil, seroquel   Falls with multiple rib fractures: Possibly related to dysrhythmia. Ambulating well now. Initial recommendation was for SNF. Will need PT reassessment to inform disposition.  - Continue PT/OT, frequent supervision - Incentive spirometry.   Kenni Newton  Jeannette How, MD [facility] [email or web address] 05/28/2022, 12:21 PM

## 2022-05-28 NOTE — CV Procedure (Signed)
TEE/DCC Anesthesia: Propofol On Rx Eliquis at least 3 doses  EF 25-30%  Mild MR No LAA thrombus Normal RV Normal AV No ASD/PFO  DCC x 1 150 J biphasic  Converted to NSR HR 72  No immediate neurologic sequelae  [name] MD [facility]

## 2022-05-28 NOTE — Transfer of Care (Signed)
Immediate Anesthesia Transfer of Care Note  Patient: MALECHI GALLENTINE  Procedure(s) Performed: TRANSESOPHAGEAL ECHOCARDIOGRAM (TEE) CARDIOVERSION  Patient Location: PACU  Anesthesia Type:MAC  Level of Consciousness: awake, alert , and oriented  Airway & Oxygen Therapy: Patient Spontanous Breathing  Post-op Assessment: Report given to RN and Post -op Vital signs reviewed and stable  Post vital signs: Reviewed and stable  Last Vitals:  Vitals Value Taken Time  BP 94/61 05/28/22 0750  Temp 98   Pulse 77 05/28/22 0756  Resp 17 05/28/22 0756  SpO2 97 % 05/28/22 0756  Vitals shown include unvalidated device data.  Last Pain:  Vitals:   05/28/22 0750  TempSrc:   PainSc: 0-No pain         Complications: No notable events documented.

## 2022-05-28 NOTE — Interval H&P Note (Signed)
History and Physical Interval Note:  05/28/2022 7:19 AM  Brendan Butler  has presented today for surgery, with the diagnosis of afib.  The various methods of treatment have been discussed with the patient and family. After consideration of risks, benefits and other options for treatment, the patient has consented to  Procedure(s): TRANSESOPHAGEAL ECHOCARDIOGRAM (TEE) (N/A) CARDIOVERSION (N/A) as a surgical intervention.  The patient's history has been reviewed, patient examined, no change in status, stable for surgery.  I have reviewed the patient's chart and labs.  Questions were answered to the patient's satisfaction.     [name]

## 2022-05-29 ENCOUNTER — Encounter (HOSPITAL_COMMUNITY): Payer: Self-pay | Admitting: Cardiovascular Disease

## 2022-05-29 ENCOUNTER — Other Ambulatory Visit (HOSPITAL_COMMUNITY): Payer: Self-pay

## 2022-05-29 DIAGNOSIS — I4891 Unspecified atrial fibrillation: Secondary | ICD-10-CM | POA: Diagnosis not present

## 2022-05-29 LAB — BASIC METABOLIC PANEL
Anion gap: 7 (ref 5–15)
BUN: 14 mg/dL (ref 8–23)
CO2: 23 mmol/L (ref 22–32)
Calcium: 8.2 mg/dL — ABNORMAL LOW (ref 8.9–10.3)
Chloride: 111 mmol/L (ref 98–111)
Creatinine, Ser: 1.4 mg/dL — ABNORMAL HIGH (ref 0.61–1.24)
GFR, Estimated: 54 mL/min — ABNORMAL LOW (ref >60)
Glucose, Bld: 93 mg/dL (ref 70–99)
Potassium: 3.8 mmol/L (ref 3.5–5.1)
Sodium: 141 mmol/L (ref 135–145)

## 2022-05-29 MED ORDER — EMPAGLIFLOZIN 10 MG PO TABS
10.0000 mg | ORAL_TABLET | Freq: Every day | ORAL | 0 refills | Status: DC
  Filled 2022-05-29: qty 30, 30d supply, fill #0

## 2022-05-29 MED ORDER — POLYETHYLENE GLYCOL 3350 17 G PO PACK
17.0000 g | PACK | Freq: Every day | ORAL | Status: DC
  Administered 2022-05-29: 17 g via ORAL
  Filled 2022-05-29: qty 1

## 2022-05-29 MED ORDER — ROSUVASTATIN CALCIUM 20 MG PO TABS
20.0000 mg | ORAL_TABLET | Freq: Every day | ORAL | 0 refills | Status: DC
  Filled 2022-05-29: qty 30, 30d supply, fill #0

## 2022-05-29 MED ORDER — SACUBITRIL-VALSARTAN 24-26 MG PO TABS
1.0000 | ORAL_TABLET | Freq: Two times a day (BID) | ORAL | 0 refills | Status: DC
  Filled 2022-05-29: qty 60, 30d supply, fill #0

## 2022-05-29 MED ORDER — BISACODYL 10 MG RE SUPP
10.0000 mg | Freq: Once | RECTAL | Status: AC
  Administered 2022-05-29: 10 mg via RECTAL
  Filled 2022-05-29: qty 1

## 2022-05-29 MED ORDER — VITAMIN B-12 1000 MCG PO TABS
1000.0000 ug | ORAL_TABLET | Freq: Every day | ORAL | 0 refills | Status: DC
  Filled 2022-05-29: qty 30, 30d supply, fill #0

## 2022-05-29 MED ORDER — SPIRONOLACTONE 25 MG PO TABS
12.5000 mg | ORAL_TABLET | Freq: Every day | ORAL | 0 refills | Status: DC
  Filled 2022-05-29: qty 15, 30d supply, fill #0

## 2022-05-29 MED ORDER — RIVAROXABAN 20 MG PO TABS
20.0000 mg | ORAL_TABLET | Freq: Every day | ORAL | 0 refills | Status: DC
  Filled 2022-05-29: qty 30, 30d supply, fill #0

## 2022-05-29 MED ORDER — AMIODARONE HCL 200 MG PO TABS
200.0000 mg | ORAL_TABLET | Freq: Every day | ORAL | 0 refills | Status: DC
  Filled 2022-05-29: qty 30, 30d supply, fill #0

## 2022-05-29 NOTE — Progress Notes (Signed)
Occupational Therapy Treatment Patient Details Name: Brendan Butler MRN: [id number] DOB: April 23, 1950 Today's Date: 05/29/2022   History of present illness Pt is a 72 y.o. male who presented from home to the ED with AMS and falls. CT revealed multi R rib fxs. He had a recent hospitalization 3/10-3/14 with similar presentation and diagnosed with a fib with RVR. PMH: HTN, hyperlipidemia, CAD, CHF, obstructive sleep apnea.   OT comments  Patient with good progress toward all patient focused goals.  Patient stating he is hoping to possibly discharge today.  Patient able to mobilize in the room with supervision, and no physical assist with ADL completion at a sit to stand level.  No OT is anticipated for post acute, but OT will continue to follow in the acute setting if he remains to address any deficits listed.     Recommendations for follow up therapy are one component of a multi-disciplinary discharge planning process, led by the attending physician.  Recommendations may be updated based on patient status, additional functional criteria and insurance authorization.    Assistance Recommended at Discharge Set up Supervision/Assistance  Patient can return home with the following  Assist for transportation   Equipment Recommendations  None recommended by OT    Recommendations for Other Services      Precautions / Restrictions Precautions Precautions: Fall Restrictions Weight Bearing Restrictions: No       Mobility Bed Mobility Overal bed mobility: Independent                  Transfers Overall transfer level: Needs assistance   Transfers: Sit to/from Stand, Bed to chair/wheelchair/BSC Sit to Stand: Supervision     Step pivot transfers: Supervision           Balance Overall balance assessment: Needs assistance Sitting-balance support: Feet supported Sitting balance-Leahy Scale: Normal     Standing balance support: No upper extremity supported Standing  balance-Leahy Scale: Fair                             ADL either performed or assessed with clinical judgement   ADL       Grooming: Wash/dry hands;Wash/dry face;Standing;Supervision/safety               Lower Body Dressing: Supervision/safety;Sit to/from stand   Toilet Transfer: Sales executive;Ambulation                  Extremity/Trunk Assessment Upper Extremity Assessment Upper Extremity Assessment: Overall WFL for tasks assessed   Lower Extremity Assessment Lower Extremity Assessment: Defer to PT evaluation   Cervical / Trunk Assessment Cervical / Trunk Assessment: Normal    Vision       Perception     Praxis      Cognition Arousal/Alertness: Awake/alert Behavior During Therapy: WFL for tasks assessed/performed Overall Cognitive Status: Within Functional Limits for tasks assessed                                                             Pertinent Vitals/ Pain       Pain Assessment Pain Assessment: No/denies pain Pain Intervention(s): Monitored during session  Frequency  Min 2X/week        Progress Toward Goals  OT Goals(current goals can now be found in the care plan section)  Progress towards OT goals: Progressing toward goals  Acute Rehab OT Goals OT Goal Formulation: With patient Time For Goal Achievement: 06/12/22 Potential to Achieve Goals: Good ADL Goals Pt Will Transfer to Toilet: Independently;ambulating;regular height toilet  Plan Discharge plan remains appropriate    Co-evaluation                 AM-PAC OT "6 Clicks" Daily Activity     Outcome Measure   Help from another person eating meals?: None Help from another person taking care of personal grooming?: None Help from another person toileting, which includes using toliet, bedpan, or urinal?: A Little Help from another  person bathing (including washing, rinsing, drying)?: A Little Help from another person to put on and taking off regular upper body clothing?: None Help from another person to put on and taking off regular lower body clothing?: A Little 6 Click Score: 21    End of Session    OT Visit Diagnosis: Unsteadiness on feet (R26.81)   Activity Tolerance Patient tolerated treatment well   Patient Left in chair;with call bell/phone within reach;with chair alarm set   Nurse Communication Mobility status        Time: [id number] OT Time Calculation (min): 15 min  Charges: OT General Charges $OT Visit: 1 Visit OT Treatments $Self Care/Home Management : 8-22 mins  05/29/2022  RP, OTR/L  Acute Rehabilitation Services  Office:  [phone]   Metta Clines 05/29/2022, 8:59 AM

## 2022-05-29 NOTE — Progress Notes (Signed)
Pt is alert and fully oriented x 4, stable hemodynamically, afebrile, SPO2 95-99% on room air, no respiratory distress noted. Sinus rhythm with occasionally PCAs on monitor. HR 60s-70s. Right chest pain from ribs fracture is well tolerated after Tylenol and Lyrica given. Pt has slept well overnight.   Pt has a complaint of constipation, last BM was 6 days ago( reported last BM on 05/23/22 prior this admission). Prune juice and Senokot given. Denies abdominal pain or distention. Positive bowel sound. Pt reports having positive flatulence. Normally his BM is regular once everyday. We will hand off to the day shift round for alternative laxative. Increasing fiber from fruit and vegetable and adequate fluid intake is encouraged.   Right great toe wound dry and clean. Wound is cleaned, applied bacitracin antibiotic ointment and dressing changed.  Continue to monitor.   Kennyth Lose, RN

## 2022-05-29 NOTE — Progress Notes (Signed)
F/u discussed with Dr. Stanford Breed, had already been arranged and placed on AVS which has been printed. Per Dr. Stanford Breed OK to get BMET at time of 4/11 OV as scheduled.

## 2022-05-29 NOTE — Anesthesia Postprocedure Evaluation (Signed)
Anesthesia Post Note  Patient: Brendan Butler  Procedure(s) Performed: TRANSESOPHAGEAL ECHOCARDIOGRAM (TEE) CARDIOVERSION     Patient location during evaluation: PACU Anesthesia Type: MAC Level of consciousness: awake and alert Pain management: pain level controlled Vital Signs Assessment: post-procedure vital signs reviewed and stable Respiratory status: spontaneous breathing, nonlabored ventilation, respiratory function stable and patient connected to nasal cannula oxygen Cardiovascular status: blood pressure returned to baseline and stable Postop Assessment: no apparent nausea or vomiting Anesthetic complications: no  No notable events documented.  Last Vitals:  Vitals:   05/29/22 0451 05/29/22 0800  BP: 119/77 (!) 132/58  Pulse: 64 64  Resp: 16 12  Temp: (!) 36.2 C 36.7 C  SpO2:  97%    Last Pain:  Vitals:   05/29/22 0800  TempSrc: Oral  PainSc:                  Keylee Shrestha S

## 2022-05-29 NOTE — Progress Notes (Signed)
Physical Therapy Treatment Patient Details Name: Brendan Butler MRN: [id number] DOB: Dec 15, 1950 Today's Date: 05/29/2022   History of Present Illness Pt is a 72 y.o. male who presented from home to the ED with AMS and falls. CT revealed multi R rib fxs. He had a recent hospitalization 3/10-3/14 with similar presentation and diagnosed with a fib with RVR. PMH: HTN, hyperlipidemia, CAD, CHF, obstructive sleep apnea.    PT Comments    Pt received in recliner, agreeable to therapy session with emphasis on transfer, gait and stair training. Pt daughter present for teachback instruction on safety and guarding technique with gait belt/HHA for stairs/gait with RW. Also instructed pt/daughter who will be assisting him on RW adjustment (his RW not present during session so demonstrated with hospital RW). Anticipate pt safe to DC home from a functional mobility standpoint once he is medically cleared as long as he has consistent family supervision/assist for safety.    Recommendations for follow up therapy are one component of a multi-disciplinary discharge planning process, led by the attending physician.  Recommendations may be updated based on patient status, additional functional criteria and insurance authorization.  Follow Up Recommendations  Can patient physically be transported by private vehicle: Yes    Assistance Recommended at Discharge Frequent or constant Supervision/Assistance  Patient can return home with the following Assist for transportation;Assistance with cooking/housework;Direct supervision/assist for medications management;Help with stairs or ramp for entrance;A little help with walking and/or transfers;A little help with bathing/dressing/bathroom   Equipment Recommendations  None recommended by PT (pt has RW at home)    Recommendations for Other Services       Precautions / Restrictions Precautions Precautions: Fall Precaution Comments: orthostatic hypotension week of  3/25 Restrictions Weight Bearing Restrictions: No     Mobility  Bed Mobility               General bed mobility comments: pt received up in chair and sitting in chair at end of session    Transfers Overall transfer level: Needs assistance Equipment used: Rolling walker (2 wheels) Transfers: Sit to/from Stand Sit to Stand: Supervision           General transfer comment: pt with some carryover of safe UE placement, teachback for safety and daughter present also for instruction    Ambulation/Gait Ambulation/Gait assistance: Supervision Gait Distance (Feet): 125 Feet Assistive device: Rolling walker (2 wheels) Gait Pattern/deviations: Step-through pattern, Decreased stride length, Trunk flexed Gait velocity: fair speed, did not formally assess     General Gait Details: pt often standing with CoG outside RW and needs reminder to maintain proximity to AD. No c/o nausea or dizziness. BP checked after return to chair and reading 156/59 (88) at that time. HR 70's bpm with exertional tasks.   Stairs   Stairs assistance: Min guard Stair Management: Step to pattern, Alternating pattern, Forwards, One rail Left Number of Stairs: 5 General stair comments: Cues for step-to pattern for safety, pt at times performing reciprocal pattern despite cues. Pt daughter present and able to demo back safe guarding position for HHA on opposite hand (rails were too far to hold on to both together). No loss of balance.   Wheelchair Mobility    Modified Rankin (Stroke Patients Only)       Balance Overall balance assessment: Needs assistance Sitting-balance support: Single extremity supported, Feet supported Sitting balance-Leahy Scale: Good     Standing balance support: Single extremity supported, Bilateral upper extremity supported, During functional activity Standing balance-Leahy Scale: Fair  Standing balance comment: supervision using RW and for static standing                             Cognition Arousal/Alertness: Awake/alert Behavior During Therapy: WFL for tasks assessed/performed Overall Cognitive Status: Within Functional Limits for tasks assessed Area of Impairment: Following commands, Memory                     Memory: Decreased short-term memory, Decreased recall of precautions Following Commands: Follows one step commands consistently Safety/Judgement: Decreased awareness of safety   Problem Solving: Requires verbal cues General Comments: Patient following most simple commands. Pt needed increased cues for safe stair sequencing (cued for step-to pattern but pt at times still performing reciprocal pattern).        Exercises      General Comments General comments (skin integrity, edema, etc.): BP 156/59 (88) post-exertion seated in chair; Pt daughter present and notified that he has had some urinary incontinence during hospital stay and may need to look into briefs for him to wear during evenings vs using a urinal at bedside if he doesn't have time to get safely to bathroom. Daughter instructed on guarding position for gait/stairs and fitting RW height correctly and receptive. Pt/daughter given gait belt to use for stair ascent/ambulation at home and encouraged him to wear TED hose at least initially for improved hemodynamics given hx of syncopal type symptoms and recent falls. Pt and family given gait belt to take home for pt safety.      Pertinent Vitals/Pain Pain Assessment Pain Assessment: No/denies pain Pain Intervention(s): Monitored during session     PT Goals (current goals can now be found in the care plan section) Acute Rehab PT Goals PT Goal Formulation: With patient Time For Goal Achievement: 06/04/22 Progress towards PT goals: Progressing toward goals    Frequency    Min 1X/week      PT Plan Current plan remains appropriate       AM-PAC PT "6 Clicks" Mobility   Outcome Measure  Help needed turning  from your back to your side while in a flat bed without using bedrails?: None Help needed moving from lying on your back to sitting on the side of a flat bed without using bedrails?: A Little Help needed moving to and from a bed to a chair (including a wheelchair)?: A Little Help needed standing up from a chair using your arms (e.g., wheelchair or bedside chair)?: A Little Help needed to walk in hospital room?: A Little Help needed climbing 3-5 steps with a railing? : A Little 6 Click Score: 19    End of Session Equipment Utilized During Treatment: Gait belt Activity Tolerance: Patient tolerated treatment well Patient left: in chair;with call bell/phone within reach;with family/visitor present (daughter present to get him dressed, RN in room to give his meds) Nurse Communication: Mobility status (pt has a question about suppository) PT Visit Diagnosis: Difficulty in walking, not elsewhere classified (R26.2);Repeated falls (R29.6)     Time: [id number] PT Time Calculation (min) (ACUTE ONLY): 10 min  Charges:  $Gait Training: 8-22 mins                     Maely Clements P., PTA Acute Rehabilitation Services Secure Chat Preferred 9a-5:30pm Office: [place] 05/29/2022, 11:43 AM

## 2022-05-29 NOTE — Discharge Summary (Signed)
Physician Discharge Summary   Patient: Brendan Butler MRN: [id number] DOB: December 26, 1950  Admit date:     05/20/2022  Discharge date: 05/29/22  Discharge Physician: Patrecia Pour   PCP: Wenda Low, MD   Recommendations at discharge:   Follow up with PCP in the next week.  Follow up with cardiology 4/11 with BMP.   Discharge Diagnoses: Principal Problem:   Atrial fibrillation with RVR Active Problems:   Delirium due to another medical condition   Falls frequently  Hospital Course: Brendan Butler is a 72 y.o. male with PMH significant for CAD, HLD, ED, OSA not on CPAP, HTN admitted with generalized weakness and falls. He was just discharged from the hospital about 10 days ago when he was admitted with Afib with RVR and aflutter and cardioverted. Echo with reduced EF. He was discharged but inconsistent medication adherence and reportedly too weak to really get out of bed and not eating much and numerous falls, he was seen by cardiology, his medication has been adjusted. Metoprolol was given with good rate control, though subsequently developed prolonged pauses. AV nodal agents have been discontinued with improvement in bradycardic rate, and the patient underwent DCCV 4/1. GDMT has been initiated and cardiology follow up secured.   Assessment and Plan: Persistent A-fib with RVR:  Patient was recently admitted earlier this month for altered mental status and incidentally found to have new onset atrial flutter with RVR. He underwent successful TEE/ DCCV with restoration of sinus rhythm. TEE showed EF had dropped from 55-60% in 11/2021 to 20-25%. He left AMA before medications could be optimized. He re-presented on 05/20/2022 with worsening mental status and was back in atrial flutter with RVR. Rates were initially in the 130s.  He was initially started on IV Diltiazem with improvement in rates. IV Diltaizem was switched to IV Amiodarone given reduced EF but Amiodarone has now been discontinued  as well given concern about long-term Amiodarone use in setting of medication non-compliance.  - Continue xarelto - EP consulted for pauses > 5 seconds, now on amiodarone 200mg  daily and in NSR s/p TEE-DCCV 4/1.     CAD - With addition of DOAC, plavix is stopped.  - Continue high intensity statin.   Chronic CHmrEF Ischemic Cardiomyopathy EF 35-40% in 12/2012 in the setting of an anterior MI.  However, EF later normalized.  TTE 11/2021 showed LVEF of 55-60% with hypokinesis of the entire anterior septum (suggestive of prior anteroseptal/LAD infarct) grade 1 diastolic dysfunction. However, recent TEE earlier during admission earlier this month showed new drop in EF to 20-25%. This was felt to likely be tachy-mediated in setting of rapid atrial flutter.  - Continue entresto, jardiance, and rhythm control as above. Avoiding BB w/pauses.    Hypertension - well-controlled, continue with medication for CHF as above   Hyperlipidemia  - Continue high intensity statin   CKD stage IIIa: Stable  Confusion and acute metabolic encephalopathy due to delirium with likely progressive Cognitive Decline -Patient back to baseline, EEG and MRI nonrevealing   B12 deficiency - Supplement   Constipation: Abd benign - Continue bowel regimen.  Depression: No changes to home medications - Continue bupropion, paxil, seroquel   Falls with multiple rib fractures: Possibly related to dysrhythmia. Ambulating well now. Initial recommendation was for SNF but now much better and stable for home w/HH arranged. - Continue PT/OT at home frequent supervision - Incentive spirometry.   Consultants: Cardiology Procedures performed: TEE - DCCV 4/1  Disposition: Home Diet recommendation:  Discharge Diet Orders (From admission, onward)     Start     Ordered   05/24/22 0000  Diet - low sodium heart healthy        05/24/22 1032          DISCHARGE MEDICATION: Allergies as of 05/29/2022       Reactions   Levitra  [vardenafil] Other (See Comments)   Blurred vision   Aspirin Hives   Lipitor [atorvastatin] Other (See Comments)   Muscle cramps; patient states he does not take medication at home        Medication List     STOP taking these medications    apixaban 5 MG Tabs tablet Commonly known as: ELIQUIS   clopidogrel 75 MG tablet Commonly known as: PLAVIX   furosemide 20 MG tablet Commonly known as: LASIX   metoprolol tartrate 25 MG tablet Commonly known as: LOPRESSOR   nitroGLYCERIN 0.4 MG SL tablet Commonly known as: NITROSTAT   potassium chloride 10 MEQ tablet Commonly known as: KLOR-CON M   ramelteon 8 MG tablet Commonly known as: ROZEREM       TAKE these medications    amiodarone 200 MG tablet Commonly known as: PACERONE Take 1 tablet (200 mg total) by mouth daily.   buPROPion 150 MG 24 hr tablet Commonly known as: WELLBUTRIN XL Take 1 tablet (150 mg total) by mouth daily. What changed: how much to take   cyanocobalamin 1000 MCG tablet Commonly known as: VITAMIN B12 Take 1 tablet (1,000 mcg total) by mouth daily.   empagliflozin 10 MG Tabs tablet Commonly known as: JARDIANCE Take 1 tablet (10 mg total) by mouth daily.   fluticasone 50 MCG/ACT nasal spray Commonly known as: FLONASE Place 1 spray into both nostrils daily.   pantoprazole 40 MG tablet Commonly known as: PROTONIX Take 1 tablet (40 mg total) by mouth daily.   PARoxetine 30 MG tablet Commonly known as: PAXIL Take 30 mg by mouth daily.   pregabalin 150 MG capsule Commonly known as: LYRICA Take 1 capsule (150 mg total) by mouth 2 (two) times daily. What changed:  how much to take when to take this   QUEtiapine 25 MG tablet Commonly known as: SEROQUEL Take 1 tablet (25 mg total) by mouth at bedtime.   rivaroxaban 20 MG Tabs tablet Commonly known as: XARELTO Take 1 tablet (20 mg total) by mouth daily with supper.   rosuvastatin 20 MG tablet Commonly known as: CRESTOR Take 1 tablet  (20 mg total) by mouth daily. What changed:  medication strength how much to take   sacubitril-valsartan 24-26 MG Commonly known as: ENTRESTO Take 1 tablet by mouth 2 (two) times daily.   spironolactone 25 MG tablet Commonly known as: ALDACTONE Take 0.5 tablets (12.5 mg total) by mouth daily.        Contact information for follow-up providers     Fenton, Clint R, PA Follow up.   Specialty: Cardiology Why: Hospital follow-up in the [facility] scheduled for 06/14/2022 at 2:00pm. If this date/time does not work for you, please call our office to reschedule. Contact information: [place] [address] [phone]         Elgie Collard, PA-C Follow up.   Specialty: Cardiology Why: Hospital follow-up with General Cardiolgoy scheduled for 06/07/2022 at 10:55am. Please arrive 15 minutes early for check-in. If this date/time does not work for you, please call our office to reschedule. Contact information: [place]  [place] [address] [phone]         Wenda Low, MD Follow up.   Specialty: Internal Medicine Contact information: [address] [place] [address] [phone]              Contact information for after-discharge care     Destination     [facility] Preferred SNF .   Service: Skilled Nursing Contact information: [place] [phone]                    Discharge Exam: Danley Danker Weights   05/26/22 0319 05/28/22 0329 05/29/22 0451  Weight: 89.8 kg 90 kg 91.8 kg  BP (!) 156/59 (BP Location: Right Arm) Comment: post-ambulation  Pulse 70   Temp 98 F (36.7 C) (Oral)   Resp 12   Ht 5\' 10"  (1.778 m)   Wt 91.8 kg   SpO2 97%   BMI 29.04 kg/m   No distress, pleasant, alert and oriented Poor dentition without abscess RRR, NSR on monitor, no edema Clear, nonlabored  Condition at discharge: stable  The results of  significant diagnostics from this hospitalization (including imaging, microbiology, ancillary and laboratory) are listed below for reference.   Imaging Studies: ECHO TEE  Result Date: 05/28/2022    TRANSESOPHOGEAL ECHO REPORT   Patient Name:   Brendan Butler Date of Exam: 05/28/2022 Medical Rec #:  [id number]        Height:       70.0 in Accession #:    [id number]       Weight:       198.5 lb Date of Birth:  1950-09-15       BSA:          2.081 m Patient Age:    72 years         BP:           112/68 mmHg Patient Gender: M                HR:           99 bpm. Exam Location:  Inpatient Procedure: Transesophageal Echo and Color Doppler Indications:     Atrial fibrillation requiring DCCV  History:         Patient has prior history of Echocardiogram examinations, most                  recent 05/10/2022. CAD, Arrythmias:Atrial Fibrillation; Risk                  Factors:Hypertension, Dyslipidemia and Former Smoker.  Sonographer:     Rolla Etienne Referring Phys:  [place] Diagnosing Phys: [name] MD PROCEDURE: The transesophogeal probe was passed without difficulty through the esophogus of the patient. Sedation performed by different physician. The patient was monitored while under deep sedation. Anesthestetic sedation was provided intravenously by Anesthesiology: 100mg  of Propofol. Image quality was good. The patient's vital signs; including heart rate, blood pressure, and oxygen saturation; remained stable throughout the procedure. The patient developed no complications during the procedure. A successful direct current cardioversion was performed at 150 joules with 1 attempt.  IMPRESSIONS  1. No LAA thrombus DCC x 1 with 150 J biphasic converted to NSR.  2. Left ventricular ejection fraction, by estimation, is 25 to 30%. The left ventricle has severely decreased function. The left ventricle demonstrates global hypokinesis. The left ventricular internal cavity size was moderately dilated.  3.  Right  ventricular systolic function is normal. The right ventricular size is normal.  4. Left atrial size was moderately dilated. No left atrial/left atrial appendage thrombus was detected.  5. The mitral valve is normal in structure. Mild mitral valve regurgitation. No evidence of mitral stenosis.  6. The aortic valve is tricuspid. Aortic valve regurgitation is not visualized. No aortic stenosis is present.  7. The inferior vena cava is normal in size with greater than 50% respiratory variability, suggesting right atrial pressure of 3 mmHg. Conclusion(s)/Recommendation(s): Normal biventricular function without evidence of hemodynamically significant valvular heart disease. FINDINGS  Left Ventricle: Left ventricular ejection fraction, by estimation, is 25 to 30%. The left ventricle has severely decreased function. The left ventricle demonstrates global hypokinesis. The left ventricular internal cavity size was moderately dilated. There is no left ventricular hypertrophy. Right Ventricle: The right ventricular size is normal. No increase in right ventricular wall thickness. Right ventricular systolic function is normal. Left Atrium: Left atrial size was moderately dilated. No left atrial/left atrial appendage thrombus was detected. Right Atrium: Right atrial size was normal in size. Pericardium: There is no evidence of pericardial effusion. Mitral Valve: The mitral valve is normal in structure. Mild mitral valve regurgitation. No evidence of mitral valve stenosis. Tricuspid Valve: The tricuspid valve is normal in structure. Tricuspid valve regurgitation is not demonstrated. No evidence of tricuspid stenosis. Aortic Valve: The aortic valve is tricuspid. Aortic valve regurgitation is not visualized. No aortic stenosis is present. Pulmonic Valve: The pulmonic valve was normal in structure. Pulmonic valve regurgitation is not visualized. No evidence of pulmonic stenosis. Aorta: The aortic root is normal in size and structure.  Venous: The inferior vena cava is normal in size with greater than 50% respiratory variability, suggesting right atrial pressure of 3 mmHg. IAS/Shunts: No atrial level shunt detected by color flow Doppler. Additional Comments: No LAA thrombus DCC x 1 with 150 J biphasic converted to NSR. [name] MD Electronically signed by [name] MD Signature Date/Time: 05/28/2022/10:13:00 AM    Final    DG CHEST PORT 1 VIEW  Result Date: 05/23/2022 CLINICAL DATA:  Shortness of breath. EXAM: PORTABLE CHEST 1 VIEW COMPARISON:  05/20/2022 FINDINGS: The lungs are clear without focal pneumonia, edema, pneumothorax or pleural effusion. The cardiopericardial silhouette is within normal limits for size. The visualized bony structures of the thorax are unremarkable. IMPRESSION: No active disease. Electronically Signed   By: Misty Stanley M.D.   On: 05/23/2022 07:51   Overnight EEG with video  Result Date: 05/22/2022 Lora Havens, MD     05/23/2022  9:44 AM Patient Name: Brendan Butler MRN: [id number] Epilepsy Attending: Lora Havens Referring Physician/Provider: Kerney Elbe, DO Duration: 05/21/2022 2108 to 05/22/2022 2108  Patient history: 72yo M with ams. EEG to evaluate for seizure  Level of alertness: Awake, asleep  AEDs during EEG study: Pregabalin  Technical aspects: This EEG study was done with scalp electrodes positioned according to the 10-20 International system of electrode placement. Electrical activity was reviewed with band pass filter of 1-70Hz , sensitivity of 7 uV/mm, display speed of 60mm/sec with a 60Hz  notched filter applied as appropriate. EEG data were recorded continuously and digitally stored.  Video monitoring was available and reviewed as appropriate.  Description: The posterior dominant rhythm consists of 7 Hz activity of moderate voltage (25-35 uV) seen predominantly in posterior head regions, symmetric and reactive to eye opening and eye closing. Sleep was characterized by vertex  waves, sleep spindles (12-14hz ), maximal fronto-central  region. . EEG showed continuous generalized 6-7hz  theta slowing.  ABNORMALITY - Background slow, generalized - Continuous slow, generalized  IMPRESSION: This study is suggestive of mild diffuse encephalopathy, nonspecific etiology. No seizures or epileptiform discharges were seen throughout the recording.  Lora Havens   EEG adult  Result Date: 05/22/2022 Lora Havens, MD     05/22/2022  8:54 AM Patient Name: Brendan Butler MRN: [id number] Epilepsy Attending: Lora Havens Referring Physician/Provider: Thomasene Ripple, MD Date: 05/21/2022 Duration: 26.52 mins Patient history: 72yo M with ams. EEG to evaluate for seizure Level of alertness: Awake, drowsy AEDs during EEG study: Pregabalin Technical aspects: This EEG study was done with scalp electrodes positioned according to the 10-20 International system of electrode placement. Electrical activity was reviewed with band pass filter of 1-70Hz , sensitivity of 7 uV/mm, display speed of 16mm/sec with a 60Hz  notched filter applied as appropriate. EEG data were recorded continuously and digitally stored.  Video monitoring was available and reviewed as appropriate. Description: The posterior dominant rhythm consists of 7 Hz activity of moderate voltage (25-35 uV) seen predominantly in posterior head regions, symmetric and reactive to eye opening and eye closing. Drowsiness was characterized by attenuation of the posterior background rhythm. EEG showed continuous generalized 6-7hz  theta slowing. Physiologic photic driving was not seen during photic stimulation.  Hyperventilation was not performed.   ABNORMALITY - Background slow, generalized - Continuous slow, generalized IMPRESSION: This study is suggestive of mild diffuse encephalopathy, nonspecific etiology. No seizures or epileptiform discharges were seen throughout the recording. Lora Havens   CT CHEST ABDOMEN PELVIS W CONTRAST  Result  Date: 05/20/2022 CLINICAL DATA:  Polytrauma, blunt EXAM: CT CHEST, ABDOMEN, AND PELVIS WITH CONTRAST TECHNIQUE: Multidetector CT imaging of the chest, abdomen and pelvis was performed following the standard protocol during bolus administration of intravenous contrast. RADIATION DOSE REDUCTION: This exam was performed according to the departmental dose-optimization program which includes automated exposure control, adjustment of the mA and/or kV according to patient size and/or use of iterative reconstruction technique. CONTRAST:  39mL ISOVUE-370 IOPAMIDOL (ISOVUE-370) INJECTION 76% COMPARISON:  PET CT 04/11/2022 FINDINGS: CHEST: Cardiovascular: No aortic injury. The thoracic aorta is normal in caliber. The heart is normal in size. No significant pericardial effusion. Coronary artery calcification with left anterior descending coronary artery stent. Mediastinum/Nodes: No pneumomediastinum. No mediastinal hematoma. The esophagus is unremarkable. The thyroid is unremarkable. The central airways are patent. No mediastinal, hilar, or axillary lymphadenopathy. Lungs/Pleura: Bilateral upper lobe thin walled subcentimeter cystic lesions. Chronic 3 mm metallic density within the right lower lobe (3:44). No focal consolidation. No pulmonary nodule. No pulmonary mass. No pulmonary contusion or laceration. No pneumatocele formation. Trace right pleural effusion.  No pneumothorax. No hemothorax. Musculoskeletal/Chest wall: No chest wall mass. Acute minimally displaced right posterior eleventh and twelfth ribs fractured in 2 different places. Acute minimally displaced right posterolateral ninth and eighth ribs. No spinal fracture. Surgical hardware of the left humerus. ABDOMEN / PELVIS: Hepatobiliary: Not enlarged. No focal lesion. No laceration or subcapsular hematoma. Status post cholecystectomy.  No biliary ductal dilatation. Pancreas: Normal pancreatic contour. No main pancreatic duct dilatation. Spleen: Not enlarged. No  focal lesion. No laceration, subcapsular hematoma, or vascular injury. Adrenals/Urinary Tract: No nodularity bilaterally. The right kidney is homogeneously enhancing. Status post left nephrectomy. No hydronephrosis. No contusion, laceration, or subcapsular hematoma. No injury to the vascular structures or collecting systems. No hydroureter. The urinary bladder is unremarkable. Stomach/Bowel: No small or large bowel wall thickening or dilatation. Colonic diverticulosis.  The appendix is unremarkable. Vasculature/Lymphatics: Severe atherosclerotic plaque. No abdominal aorta or iliac aneurysm. No active contrast extravasation or pseudoaneurysm. No abdominal, pelvic, inguinal lymphadenopathy. Reproductive: Normal. Other: No simple free fluid ascites. No pneumoperitoneum. No hemoperitoneum. No mesenteric hematoma identified. No organized fluid collection. Musculoskeletal: Subcutaneus soft tissue edema and hematoma of the right flank. No acute pelvic fracture.  Chronic stable L1 compression fracture. Ports and Devices: None. IMPRESSION: 1. Acute minimally displaced right posterior 11th and 12th rib fractures in 2 different places. Acute minimally displaced right posterolateral 8th and 9th ribs. Associated subcutaneus soft tissue edema and hematoma of the right flank. 2. Trace right pleural effusion. 3. No acute intra-abdominal or intrapelvic traumatic injury. 4. No acute fracture or traumatic malalignment of the thoracic or lumbar spine. 5. Other imaging findings of potential clinical significance: Status post left nephrectomy. Aortic Atherosclerosis (ICD10-I70.0). Electronically Signed   By: Iven Finn M.D.   On: 05/20/2022 21:08   CT HEAD WO CONTRAST (5MM)  Result Date: 05/20/2022 CLINICAL DATA:  Head trauma, minor (Age >= 65y) EXAM: CT HEAD WITHOUT CONTRAST TECHNIQUE: Contiguous axial images were obtained from the base of the skull through the vertex without intravenous contrast. RADIATION DOSE REDUCTION: This  exam was performed according to the departmental dose-optimization program which includes automated exposure control, adjustment of the mA and/or kV according to patient size and/or use of iterative reconstruction technique. COMPARISON:  MRI head 05/07/2022, CT head 05/06/2022 FINDINGS: Brain: Patchy and confluent areas of decreased attenuation are noted throughout the deep and periventricular white matter of the cerebral hemispheres bilaterally, compatible with chronic microvascular ischemic disease. No evidence of large-territorial acute infarction. No parenchymal hemorrhage. No mass lesion. No extra-axial collection. No mass effect or midline shift. No hydrocephalus. Basilar cisterns are patent. Vascular: No hyperdense vessel. Atherosclerotic calcifications are present within the cavernous internal carotid arteries. Skull: No acute fracture or focal lesion. Sinuses/Orbits: Paranasal sinuses and mastoid air cells are clear. The orbits are unremarkable. Other: None. IMPRESSION: No acute intracranial abnormality. Electronically Signed   By: Iven Finn M.D.   On: 05/20/2022 19:41   DG Chest Portable 1 View  Result Date: 05/20/2022 CLINICAL DATA:  Fall. EXAM: PORTABLE CHEST 1 VIEW COMPARISON:  May 06, 2022. FINDINGS: The heart size and mediastinal contours are within normal limits. Both lungs are clear. The visualized skeletal structures are unremarkable. IMPRESSION: No active disease. Electronically Signed   By: Marijo Conception M.D.   On: 05/20/2022 16:17   ECHO TEE  Result Date: 05/10/2022    TRANSESOPHOGEAL ECHO REPORT   Patient Name:   JAMAIR NOFTZ Date of Exam: 05/10/2022 Medical Rec #:  [id number]        Height:       70.0 in Accession #:    [id number]       Weight:       206.3 lb Date of Birth:  [birth year]       BSA:          2.115 m Patient Age:    47 years         BP:           155/94 mmHg Patient Gender: M                HR:           68 bpm. Exam Location:  Inpatient Procedure:  Transesophageal Echo, 3D Echo, Color Doppler and Cardiac Doppler Indications:     Atrial flutter  History:  Patient has prior history of Echocardiogram examinations, most                  recent 12/26/2021. CAD; Risk Factors:Former Smoker,                  Hypertension, Dyslipidemia and Sleep Apnea.  Sonographer:     Johny Chess RDCS Referring Phys:  [id number] Darreld Mclean Diagnosing Phys: Eleonore Chiquito MD PROCEDURE: After discussion of the risks and benefits of a TEE, an informed consent was obtained from the patient. TEE procedure time was 10 minutes. The transesophogeal probe was passed without difficulty through the esophogus of the patient. Imaged were obtained with the patient in a left lateral decubitus position. Sedation performed by different physician. The patient was monitored while under deep sedation. Anesthestetic sedation was provided intravenously by Anesthesiology: 285mg  of Propofol, 100mg  of Lidocaine. Image quality was excellent. The patient's vital signs; including heart rate, blood pressure, and oxygen saturation; remained stable throughout the procedure. The patient developed no complications during the procedure. A successful direct current cardioversion was performed at 120 joules with 1 attempt.  IMPRESSIONS  1. Successful TEE/DCCV with return to NSR. No LAA thrombus. LVEF 20-25%. Global HK.  2. Left ventricular ejection fraction, by estimation, is 20 to 25%. The left ventricle has severely decreased function.  3. Right ventricular systolic function is mildly reduced. The right ventricular size is normal.  4. Left atrial size was moderately dilated. No left atrial/left atrial appendage thrombus was detected. The LAA emptying velocity was 39 cm/s.  5. Right atrial size was mildly dilated.  6. The mitral valve is grossly normal. Mild mitral valve regurgitation. No evidence of mitral stenosis.  7. The aortic valve is tricuspid. Aortic valve regurgitation is not visualized. No  aortic stenosis is present.  8. There is Moderate (Grade III) layered plaque involving the descending aorta. FINDINGS  Left Ventricle: Left ventricular ejection fraction, by estimation, is 20 to 25%. The left ventricle has severely decreased function. The left ventricular internal cavity size was normal in size. Right Ventricle: The right ventricular size is normal. No increase in right ventricular wall thickness. Right ventricular systolic function is mildly reduced. Left Atrium: Left atrial size was moderately dilated. No left atrial/left atrial appendage thrombus was detected. The LAA emptying velocity was 39 cm/s. Right Atrium: Right atrial size was mildly dilated. Pericardium: There is no evidence of pericardial effusion. Mitral Valve: The mitral valve is grossly normal. Mild mitral valve regurgitation. No evidence of mitral valve stenosis. Tricuspid Valve: The tricuspid valve is grossly normal. Tricuspid valve regurgitation is trivial. No evidence of tricuspid stenosis. Aortic Valve: The aortic valve is tricuspid. Aortic valve regurgitation is not visualized. No aortic stenosis is present. Pulmonic Valve: The pulmonic valve was grossly normal. Pulmonic valve regurgitation is trivial. No evidence of pulmonic stenosis. Aorta: The aortic root and ascending aorta are structurally normal, with no evidence of dilitation. There is moderate (Grade III) layered plaque involving the descending aorta. Venous: The left upper pulmonary vein, left lower pulmonary vein, right lower pulmonary vein and right upper pulmonary vein are normal. IAS/Shunts: No atrial level shunt detected by color flow Doppler.  LEFT VENTRICLE PLAX 2D LVOT diam:     2.00 cm LV SV:         35 LV SV Index:   17        3D Volume EF LVOT Area:     3.14 cm  LV 3D EDV:   95.68 ml  LV 3D ESV:   74.18 ml AORTIC VALVE LVOT Vmax:   55.50 cm/s LVOT Vmean:  39.100 cm/s LVOT VTI:    0.113 m  AORTA Ao Root diam: 3.85 cm Ao Asc diam:  3.63  cm  SHUNTS Systemic VTI:  0.11 m Systemic Diam: 2.00 cm Eleonore Chiquito MD Electronically signed by Eleonore Chiquito MD Signature Date/Time: 05/10/2022/1:37:52 PM    Final    MR BRAIN WO CONTRAST  Result Date: 05/07/2022 CLINICAL DATA:  Slurred speech EXAM: MRI HEAD WITHOUT CONTRAST TECHNIQUE: Multiplanar, multiecho pulse sequences of the brain and surrounding structures were obtained without intravenous contrast. COMPARISON:  Head CT yesterday FINDINGS: Brain: Diffusion imaging is normal. Chronic small-vessel ischemic changes are seen throughout the pons. Few old small vessel cerebellar infarctions. Cerebral hemispheres show moderate chronic small-vessel ischemic changes of the white matter. No large vessel territory stroke. No mass lesion, hemorrhage, hydrocephalus or extra-axial collection. Vascular: Major vessels at the base of the brain show flow. Skull and upper cervical spine: Negative Sinuses/Orbits: Clear/normal Other: None IMPRESSION: No acute finding. Moderate chronic small-vessel ischemic changes of the pons and cerebral hemispheric white matter. Electronically Signed   By: Nelson Chimes M.D.   On: 05/07/2022 13:39   CT Head Wo Contrast  Result Date: 05/06/2022 CLINICAL DATA:  Headache. EXAM: CT HEAD WITHOUT CONTRAST TECHNIQUE: Contiguous axial images were obtained from the base of the skull through the vertex without intravenous contrast. RADIATION DOSE REDUCTION: This exam was performed according to the departmental dose-optimization program which includes automated exposure control, adjustment of the mA and/or kV according to patient size and/or use of iterative reconstruction technique. COMPARISON:  Head CT dated 12/24/2021. FINDINGS: Brain: Mild age-related atrophy and chronic microvascular ischemic changes. Small old bilateral basal ganglia lacunar infarcts. There is no acute intracranial hemorrhage. No mass effect or midline shift. No extra-axial fluid collection. Vascular: No hyperdense vessel  or unexpected calcification. Skull: Normal. Negative for fracture or focal lesion. Sinuses/Orbits: The visualized paranasal sinuses and the mastoid air cells are clear. Right mastoid effusions. No air-fluid level. Other: None IMPRESSION: 1. No acute intracranial pathology. 2. Mild age-related atrophy and chronic microvascular ischemic changes. Small old bilateral basal ganglia lacunar infarcts. Electronically Signed   By: Anner Crete M.D.   On: 05/06/2022 20:00   DG Chest Portable 1 View  Result Date: 05/06/2022 CLINICAL DATA:  Shortness of breath EXAM: PORTABLE CHEST 1 VIEW COMPARISON:  Chest x-ray 12/28/2021 FINDINGS: The heart size and mediastinal contours are within normal limits. Both lungs are clear. The visualized skeletal structures are unremarkable. There surgical clips in the upper abdomen. IMPRESSION: No active disease. Electronically Signed   By: Ronney Asters M.D.   On: 05/06/2022 19:43    Microbiology: Results for orders placed or performed during the hospital encounter of 12/24/21  Surgical pcr screen     Status: None   Collection Time: 12/25/21  8:20 PM   Specimen: Nasal Mucosa; Nasal Swab  Result Value Ref Range Status   MRSA, PCR NEGATIVE NEGATIVE Final   Staphylococcus aureus NEGATIVE NEGATIVE Final    Comment: (NOTE) The Xpert SA Assay (FDA approved for NASAL specimens in patients 10 years of age and older), is one component of a comprehensive surveillance program. It is not intended to diagnose infection nor to guide or monitor treatment. Performed at [facility], [place] [address] [place], [place] [address]   SARS Coronavirus 2 by RT PCR (hospital order, performed in [facility] hospital lab) *cepheid single result  test* Anterior Nasal Swab     Status: None   Collection Time: 12/27/21  4:18 PM   Specimen: Anterior Nasal Swab  Result Value Ref Range Status   SARS Coronavirus 2 by RT PCR NEGATIVE NEGATIVE Final    Comment: (NOTE) SARS-CoV-2 target nucleic  acids are NOT DETECTED.  The SARS-CoV-2 RNA is generally detectable in upper and lower respiratory specimens during the acute phase of infection. The lowest concentration of SARS-CoV-2 viral copies this assay can detect is 250 copies / mL. A negative result does not preclude SARS-CoV-2 infection and should not be used as the sole basis for treatment or other patient management decisions.  A negative result may occur with improper specimen collection / handling, submission of specimen other than nasopharyngeal swab, presence of viral mutation(s) within the areas targeted by this assay, and inadequate number of viral copies (<250 copies / mL). A negative result must be combined with clinical observations, patient history, and epidemiological information.  Fact Sheet for Patients:   https://www.patel.info/  Fact Sheet for Healthcare Providers: https://hall.com/  This test is not yet approved or  cleared by the [place] FDA and has been authorized for detection and/or diagnosis of SARS-CoV-2 by FDA under an Emergency Use Authorization (EUA).  This EUA will remain in effect (meaning this test can be used) for the duration of the COVID-19 declaration under Section 564(b)(1) of the Act, 21 U.S.C. section 360bbb-3(b)(1), unless the authorization is terminated or revoked sooner.  Performed at [facility], [place] [address] [place], [place] [address]   Fungus Culture With Stain     Status: None   Collection Time: 12/28/21  1:57 PM   Specimen: Bronchial Alveolar Lavage; Respiratory  Result Value Ref Range Status   Fungus Stain Final report  Final   Fungus (Mycology) Culture Final report  Final    Comment: (NOTE) Performed At: [facility] [address] [place], [place] [id number] Rush Farmer MD [id number]    Fungal Source BRONCHIAL ALVEOLAR LAVAGE  Final    Comment: Performed at [facility], [place] [address], [place], [place] [address]  Culture, BAL-quantitative w Gram Stain     Status: None   Collection Time: 12/28/21  1:57 PM   Specimen: Bronchial Alveolar Lavage; Respiratory  Result Value Ref Range Status   Specimen Description BRONCHIAL ALVEOLAR LAVAGE  Final   Special Requests NONE  Final   Gram Stain NO WBC SEEN NO ORGANISMS SEEN   Final   Culture   Final    NO GROWTH 2 DAYS Performed at [facility], [address], [place], [place] [address]    Report Status 12/30/2021 FINAL  Final  Aerobic/Anaerobic Culture w Gram Stain (surgical/deep wound)     Status: None   Collection Time: 12/28/21  1:57 PM   Specimen: Bronchial Alveolar Lavage; Respiratory  Result Value Ref Range Status   Specimen Description BRONCHIAL ALVEOLAR LAVAGE  Final   Special Requests NONE  Final   Gram Stain   Final    RARE WBC PRESENT,BOTH PMN AND MONONUCLEAR NO ORGANISMS SEEN    Culture   Final    No growth aerobically or anaerobically. Performed at [facility], [place] [address], [place],  [address]    Report Status 01/02/2022 FINAL  Final  Fungus Culture Result     Status: None   Collection Time: 12/28/21  1:57 PM  Result Value Ref Range Status   Result 1 Comment  Final  Comment: (NOTE) KOH/Calcofluor preparation:  no fungus observed. Performed At: [facility] [place], [place] [id number] Rush Farmer MD [id number]   Fungal organism reflex     Status: None   Collection Time: 12/28/21  1:57 PM  Result Value Ref Range Status   Fungal result 1 Comment  Final    Comment: (NOTE) No yeast or mold isolated after 4 weeks. Performed At: [facility] [place], [place] [id number] Rush Farmer MD [id number]   Acid Fast Smear (AFB)     Status: None   Collection Time: 12/28/21  2:00 PM   Specimen: Lung; Respiratory  Result Value Ref Range Status   AFB Specimen Processing Concentration  Final   Acid Fast Smear Negative  Final     Comment: (NOTE) Performed At: [facility] [place], [place] [id number] Rush Farmer MD [id number]    Source (AFB) BRONCHIAL ALVEOLAR LAVAGE  Final    Comment: Performed at [facility], [place] [address], [place], [place] [address]  Acid Fast Culture with reflexed sensitivities     Status: None   Collection Time: 12/28/21  2:00 PM   Specimen: Lung; Respiratory  Result Value Ref Range Status   Acid Fast Culture Negative  Final    Comment: (NOTE) No acid fast bacilli isolated after 6 weeks. Performed At: [facility] [place], [place] [id number] Rush Farmer MD [id number]    Source of Sample BRONCHIAL ALVEOLAR LAVAGE  Final    Comment: Performed at [facility], [place] [address], [place], [place] [address]    Labs: CBC: Recent Labs  Lab 05/23/22 0139 05/26/22 0113  WBC 10.9* 10.8*  NEUTROABS 5.5  --   HGB 12.4* 12.6*  HCT 35.4* 38.3*  MCV 83.9 88.2  PLT 246 [id number]   Basic Metabolic Panel: Recent Labs  Lab 05/23/22 0139 05/24/22 0051 05/24/22 0701 05/26/22 0113 05/27/22 0115 05/28/22 0053 05/29/22 0403  NA 139 137  --  139 141 141 141  K 3.8 5.7* 4.0 3.9 3.8 3.8 3.8  CL 106 103  --  107 110 110 111  CO2 23 28  --  22 23 22 23   GLUCOSE 95 86  --  93 98 108* 93  BUN 19 23  --  21 14 13 14   CREATININE 1.41* 1.51*  --  1.59* 1.57* 1.55* 1.40*  CALCIUM 8.4* 8.7*  --  8.2* 8.2* 8.4* 8.2*  MG 2.1  --   --   --   --   --   --   PHOS 3.5  --   --   --   --   --   --    Liver Function Tests: Recent Labs  Lab 05/23/22 0139  AST 19  ALT 21  ALKPHOS 92  BILITOT 1.3*  PROT 5.6*  ALBUMIN 2.7*   CBG: No results for input(s): "GLUCAP" in the last 168 hours.  Discharge time spent: greater than 30 minutes.  Signed: Patrecia Pour, MD [facility] 05/29/2022

## 2022-05-29 NOTE — Progress Notes (Signed)
Rounding Note    Patient Name: Brendan Butler Date of Encounter: 05/29/2022  [place] Cardiologist: [name], MD   Subjective   Pt denies CP or dyspnea  Inpatient Medications    Scheduled Meds:  amiodarone  200 mg Oral Daily   bacitracin   Topical BID   bisacodyl  10 mg Rectal Once   buPROPion  300 mg Oral Daily   cyanocobalamin  1,000 mcg Subcutaneous Daily   Followed by   Derrill Memo ON 05/31/2022] cyanocobalamin  1,000 mcg Subcutaneous Q30 days   empagliflozin  10 mg Oral Daily   pantoprazole  40 mg Oral Daily   PARoxetine  30 mg Oral Daily   polyethylene glycol  17 g Oral Daily   pregabalin  150 mg Oral BID   QUEtiapine  25 mg Oral QHS   rivaroxaban  20 mg Oral Q supper   rosuvastatin  20 mg Oral Daily   sacubitril-valsartan  1 tablet Oral BID   senna-docusate  1 tablet Oral Daily   Continuous Infusions:  sodium chloride 50 mL/hr at 05/28/22 1900   sodium chloride     PRN Meds: acetaminophen, ondansetron   Vital Signs    Vitals:   05/28/22 2317 05/29/22 0105 05/29/22 0451 05/29/22 0800  BP: 132/60 138/62 119/77 (!) 132/58  Pulse: 68  64 64  Resp: 14 12 16 12   Temp: 97.9 F (36.6 C)  (!) 97.1 F (36.2 C) 98 F (36.7 C)  TempSrc: Oral  Axillary Oral  SpO2: 95%   97%  Weight:   91.8 kg   Height:        Intake/Output Summary (Last 24 hours) at 05/29/2022 0956 Last data filed at 05/29/2022 [id number] Gross per 24 hour  Intake 580.8 ml  Output 1175 ml  Net -594.2 ml       05/29/2022    4:51 AM 05/28/2022    3:29 AM 05/26/2022    3:19 AM  Last 3 Weights  Weight (lbs) 202 lb 6.1 oz 198 lb 8 oz 197 lb 15.6 oz  Weight (kg) 91.8 kg 90.039 kg 89.8 kg      Telemetry    Sinus, 3 beats NSVT - Personally Reviewed    Physical Exam   GEN: NAD Neck: Supple Cardiac: RRR Respiratory: CTA GI: Soft, NT/ND MS: No edema Neuro: Grossly intact Psych: Normal affect   Labs    High Sensitivity Troponin:   Recent Labs  Lab 05/06/22 1850  05/06/22 2110 05/20/22 1532 05/20/22 1820  TROPONINIHS 28* 28* 14 12      Chemistry Recent Labs  Lab 05/23/22 0139 05/24/22 0051 05/27/22 0115 05/28/22 0053 05/29/22 0403  NA 139   < > 141 141 141  K 3.8   < > 3.8 3.8 3.8  CL 106   < > 110 110 111  CO2 23   < > 23 22 23   GLUCOSE 95   < > 98 108* 93  BUN 19   < > 14 13 14   CREATININE 1.41*   < > 1.57* 1.55* 1.40*  CALCIUM 8.4*   < > 8.2* 8.4* 8.2*  MG 2.1  --   --   --   --   PROT 5.6*  --   --   --   --   ALBUMIN 2.7*  --   --   --   --   AST 19  --   --   --   --   ALT 21  --   --   --   --  ALKPHOS 92  --   --   --   --   BILITOT 1.3*  --   --   --   --   GFRNONAA 54*   < > 47* 48* 54*  ANIONGAP 10   < > 8 9 7    < > = values in this interval not displayed.     Lipids  Recent Labs  Lab 05/24/22 0051  CHOL 139  TRIG 182*  HDL 27*  LDLCALC 76  CHOLHDL 5.1     Hematology Recent Labs  Lab 05/23/22 0139 05/26/22 0113  WBC 10.9* 10.8*  RBC 4.22 4.34  HGB 12.4* 12.6*  HCT 35.4* 38.3*  MCV 83.9 88.2  MCH 29.4 29.0  MCHC 35.0 32.9  RDW 13.0 13.3  PLT 246 241       Patient Profile     72 y.o. 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 He was recently admitted on 05/06/2022 for altered mental status and incidentally found to be in new onset rapid atrial flutter. He underwent successful TEE/ DCCV at that time but left AMA before medications could be optimized. He was readmitted on 05/20/2022 for worsening altered mental status and was found to be back in atrial flutter with RVR. Cardiology consulted for further evaluation.   Assessment & Plan    1 atrial flutter-patient remains in sinus rhythm status post TEE guided cardioversion.  Continue amiodarone 200 mg daily and Xarelto.    2 history of  pauses-patient has had no recurrent pauses.  Continue amiodarone but avoid other AV nodal blocking agents.  3 history of coronary artery disease-continue statin.  He is not on antiplatelet medication due to need for Xarelto.  4 cardiomyopathy-LV function reduced.  Possibly tachycardia mediated.  Continue Entresto.  Will avoid beta-blocker given recent pauses. Would repeat echocardiogram in 3 months to see if LV function has improved now that sinus has been reestablished.  5 Hypertension-continue present blood pressure medications.  Patient can be discharged from a cardiac standpoint.  We will arrange follow-up in the office in 2 to 4 weeks.  Will check potassium and renal function in 1 week.  Cardiology will sign off.  Please call with questions.  For questions or updates, please contact [place] Please consult [email or web address] for contact info under        Signed, Kirk Ruths, MD  05/29/2022, 9:56 AM

## 2022-05-29 NOTE — TOC Transition Note (Signed)
Transition of Care (TOC) - CM/SW Discharge Note Marvetta Gibbons RN, BSN Transitions of Care Unit 4E- RN Case Manager See Treatment Team for direct phone #   Patient Details  Name: Brendan Butler MRN: [id number] Date of Birth: Nov 07, 1950  Transition of Care [facility]) CM/SW Contact:  Dawayne Patricia, RN Phone Number: 05/29/2022, 12:18 PM   Clinical Narrative:    Pt stable for transition home today, orders placed for HHPT/OT.  CM spoke with pt at bedside- pt agreeable to [facility] services. List provided for [facility] choice Per CMS guidelines from [email or web address] website with star ratings (copy placed in shadow chart), pt has selected [facility] as first choice with [facility] as backup. Pt states he has needed DME at home including RW.  Address, phone # and PCP all confirmed in epic.  Pt voiced his ride will be here in about 1 hour   Call made to [facility] for [facility] referral - referral has been accepted.   No further TOC needs noted.     Final next level of care: Home w Home Health Services Barriers to Discharge: Barriers Resolved   Patient Goals and CMS Choice CMS Medicare.gov Compare Post Acute Care list provided to:: Patient Choice offered to / list presented to : Patient  Discharge Placement               Home w/ [facility]          Discharge Plan and Services Additional resources added to the After Visit Summary for   In-house Referral: Clinical Social Work Discharge Planning Services: CM Consult Post Acute Care Choice: Home Health, Durable Medical Equipment          DME Arranged: N/A DME Agency: NA       HH Arranged: PT, OT HH Agency: [facility] Date [place]: 05/29/22 Time [place]: [id number] Representative spoke with at [place]: [place] Determinants of Health ([place]) Interventions SDOH Screenings   Food Insecurity: No Food Insecurity (05/21/2022)  Housing: Low Risk  (05/21/2022)  Transportation Needs: No Transportation Needs (05/21/2022)   Utilities: Not At Risk (05/21/2022)  Tobacco Use: High Risk (05/20/2022)     Readmission Risk Interventions     No data to display

## 2022-05-31 NOTE — Addendum Note (Signed)
Addendum  created 05/31/22 1229 by Myrtie Soman, MD   Clinical Note Signed

## 2022-06-01 DIAGNOSIS — S90931D Unspecified superficial injury of right great toe, subsequent encounter: Secondary | ICD-10-CM | POA: Diagnosis not present

## 2022-06-01 DIAGNOSIS — N1831 Chronic kidney disease, stage 3a: Secondary | ICD-10-CM | POA: Diagnosis not present

## 2022-06-01 DIAGNOSIS — I4892 Unspecified atrial flutter: Secondary | ICD-10-CM | POA: Diagnosis not present

## 2022-06-01 DIAGNOSIS — I2510 Atherosclerotic heart disease of native coronary artery without angina pectoris: Secondary | ICD-10-CM | POA: Diagnosis not present

## 2022-06-01 DIAGNOSIS — I255 Ischemic cardiomyopathy: Secondary | ICD-10-CM | POA: Diagnosis not present

## 2022-06-01 DIAGNOSIS — I129 Hypertensive chronic kidney disease with stage 1 through stage 4 chronic kidney disease, or unspecified chronic kidney disease: Secondary | ICD-10-CM | POA: Diagnosis not present

## 2022-06-01 DIAGNOSIS — I4819 Other persistent atrial fibrillation: Secondary | ICD-10-CM | POA: Diagnosis not present

## 2022-06-01 DIAGNOSIS — S2241XD Multiple fractures of ribs, right side, subsequent encounter for fracture with routine healing: Secondary | ICD-10-CM | POA: Diagnosis not present

## 2022-06-01 DIAGNOSIS — I252 Old myocardial infarction: Secondary | ICD-10-CM | POA: Diagnosis not present

## 2022-06-04 DIAGNOSIS — J479 Bronchiectasis, uncomplicated: Secondary | ICD-10-CM | POA: Diagnosis not present

## 2022-06-04 DIAGNOSIS — E538 Deficiency of other specified B group vitamins: Secondary | ICD-10-CM | POA: Diagnosis not present

## 2022-06-04 DIAGNOSIS — I509 Heart failure, unspecified: Secondary | ICD-10-CM | POA: Diagnosis not present

## 2022-06-04 DIAGNOSIS — N1831 Chronic kidney disease, stage 3a: Secondary | ICD-10-CM | POA: Diagnosis not present

## 2022-06-04 DIAGNOSIS — R410 Disorientation, unspecified: Secondary | ICD-10-CM | POA: Diagnosis not present

## 2022-06-04 DIAGNOSIS — R269 Unspecified abnormalities of gait and mobility: Secondary | ICD-10-CM | POA: Diagnosis not present

## 2022-06-04 DIAGNOSIS — I4891 Unspecified atrial fibrillation: Secondary | ICD-10-CM | POA: Diagnosis not present

## 2022-06-04 DIAGNOSIS — I700 Atherosclerosis of aorta: Secondary | ICD-10-CM | POA: Diagnosis not present

## 2022-06-04 DIAGNOSIS — F331 Major depressive disorder, recurrent, moderate: Secondary | ICD-10-CM | POA: Diagnosis not present

## 2022-06-05 DIAGNOSIS — I129 Hypertensive chronic kidney disease with stage 1 through stage 4 chronic kidney disease, or unspecified chronic kidney disease: Secondary | ICD-10-CM | POA: Diagnosis not present

## 2022-06-05 DIAGNOSIS — I252 Old myocardial infarction: Secondary | ICD-10-CM | POA: Diagnosis not present

## 2022-06-05 DIAGNOSIS — N1831 Chronic kidney disease, stage 3a: Secondary | ICD-10-CM | POA: Diagnosis not present

## 2022-06-05 DIAGNOSIS — I4819 Other persistent atrial fibrillation: Secondary | ICD-10-CM | POA: Diagnosis not present

## 2022-06-05 DIAGNOSIS — S2241XD Multiple fractures of ribs, right side, subsequent encounter for fracture with routine healing: Secondary | ICD-10-CM | POA: Diagnosis not present

## 2022-06-05 DIAGNOSIS — I255 Ischemic cardiomyopathy: Secondary | ICD-10-CM | POA: Diagnosis not present

## 2022-06-05 DIAGNOSIS — S90931D Unspecified superficial injury of right great toe, subsequent encounter: Secondary | ICD-10-CM | POA: Diagnosis not present

## 2022-06-05 DIAGNOSIS — I2510 Atherosclerotic heart disease of native coronary artery without angina pectoris: Secondary | ICD-10-CM | POA: Diagnosis not present

## 2022-06-05 DIAGNOSIS — I4892 Unspecified atrial flutter: Secondary | ICD-10-CM | POA: Diagnosis not present

## 2022-06-07 ENCOUNTER — Ambulatory Visit: Payer: Medicare HMO | Admitting: Physician Assistant

## 2022-06-07 DIAGNOSIS — S2241XD Multiple fractures of ribs, right side, subsequent encounter for fracture with routine healing: Secondary | ICD-10-CM | POA: Diagnosis not present

## 2022-06-07 DIAGNOSIS — S90931D Unspecified superficial injury of right great toe, subsequent encounter: Secondary | ICD-10-CM | POA: Diagnosis not present

## 2022-06-07 DIAGNOSIS — I2510 Atherosclerotic heart disease of native coronary artery without angina pectoris: Secondary | ICD-10-CM | POA: Diagnosis not present

## 2022-06-07 DIAGNOSIS — N1831 Chronic kidney disease, stage 3a: Secondary | ICD-10-CM | POA: Diagnosis not present

## 2022-06-07 DIAGNOSIS — I255 Ischemic cardiomyopathy: Secondary | ICD-10-CM | POA: Diagnosis not present

## 2022-06-07 DIAGNOSIS — I4892 Unspecified atrial flutter: Secondary | ICD-10-CM | POA: Diagnosis not present

## 2022-06-07 DIAGNOSIS — I252 Old myocardial infarction: Secondary | ICD-10-CM | POA: Diagnosis not present

## 2022-06-07 DIAGNOSIS — I129 Hypertensive chronic kidney disease with stage 1 through stage 4 chronic kidney disease, or unspecified chronic kidney disease: Secondary | ICD-10-CM | POA: Diagnosis not present

## 2022-06-07 DIAGNOSIS — I4819 Other persistent atrial fibrillation: Secondary | ICD-10-CM | POA: Diagnosis not present

## 2022-06-08 DIAGNOSIS — I4892 Unspecified atrial flutter: Secondary | ICD-10-CM | POA: Diagnosis not present

## 2022-06-08 DIAGNOSIS — S2241XD Multiple fractures of ribs, right side, subsequent encounter for fracture with routine healing: Secondary | ICD-10-CM | POA: Diagnosis not present

## 2022-06-08 DIAGNOSIS — N1831 Chronic kidney disease, stage 3a: Secondary | ICD-10-CM | POA: Diagnosis not present

## 2022-06-08 DIAGNOSIS — I255 Ischemic cardiomyopathy: Secondary | ICD-10-CM | POA: Diagnosis not present

## 2022-06-08 DIAGNOSIS — I252 Old myocardial infarction: Secondary | ICD-10-CM | POA: Diagnosis not present

## 2022-06-08 DIAGNOSIS — I4819 Other persistent atrial fibrillation: Secondary | ICD-10-CM | POA: Diagnosis not present

## 2022-06-08 DIAGNOSIS — I2510 Atherosclerotic heart disease of native coronary artery without angina pectoris: Secondary | ICD-10-CM | POA: Diagnosis not present

## 2022-06-08 DIAGNOSIS — I129 Hypertensive chronic kidney disease with stage 1 through stage 4 chronic kidney disease, or unspecified chronic kidney disease: Secondary | ICD-10-CM | POA: Diagnosis not present

## 2022-06-08 DIAGNOSIS — S90931D Unspecified superficial injury of right great toe, subsequent encounter: Secondary | ICD-10-CM | POA: Diagnosis not present

## 2022-06-12 DIAGNOSIS — I129 Hypertensive chronic kidney disease with stage 1 through stage 4 chronic kidney disease, or unspecified chronic kidney disease: Secondary | ICD-10-CM | POA: Diagnosis not present

## 2022-06-12 DIAGNOSIS — S90931D Unspecified superficial injury of right great toe, subsequent encounter: Secondary | ICD-10-CM | POA: Diagnosis not present

## 2022-06-12 DIAGNOSIS — I4892 Unspecified atrial flutter: Secondary | ICD-10-CM | POA: Diagnosis not present

## 2022-06-12 DIAGNOSIS — N1831 Chronic kidney disease, stage 3a: Secondary | ICD-10-CM | POA: Diagnosis not present

## 2022-06-12 DIAGNOSIS — I252 Old myocardial infarction: Secondary | ICD-10-CM | POA: Diagnosis not present

## 2022-06-12 DIAGNOSIS — S2241XD Multiple fractures of ribs, right side, subsequent encounter for fracture with routine healing: Secondary | ICD-10-CM | POA: Diagnosis not present

## 2022-06-12 DIAGNOSIS — I2510 Atherosclerotic heart disease of native coronary artery without angina pectoris: Secondary | ICD-10-CM | POA: Diagnosis not present

## 2022-06-12 DIAGNOSIS — I4819 Other persistent atrial fibrillation: Secondary | ICD-10-CM | POA: Diagnosis not present

## 2022-06-12 DIAGNOSIS — I255 Ischemic cardiomyopathy: Secondary | ICD-10-CM | POA: Diagnosis not present

## 2022-06-13 DIAGNOSIS — N1831 Chronic kidney disease, stage 3a: Secondary | ICD-10-CM | POA: Diagnosis not present

## 2022-06-13 DIAGNOSIS — S2241XD Multiple fractures of ribs, right side, subsequent encounter for fracture with routine healing: Secondary | ICD-10-CM | POA: Diagnosis not present

## 2022-06-13 DIAGNOSIS — S90931D Unspecified superficial injury of right great toe, subsequent encounter: Secondary | ICD-10-CM | POA: Diagnosis not present

## 2022-06-13 DIAGNOSIS — I129 Hypertensive chronic kidney disease with stage 1 through stage 4 chronic kidney disease, or unspecified chronic kidney disease: Secondary | ICD-10-CM | POA: Diagnosis not present

## 2022-06-13 DIAGNOSIS — I2510 Atherosclerotic heart disease of native coronary artery without angina pectoris: Secondary | ICD-10-CM | POA: Diagnosis not present

## 2022-06-13 DIAGNOSIS — I4892 Unspecified atrial flutter: Secondary | ICD-10-CM | POA: Diagnosis not present

## 2022-06-13 DIAGNOSIS — I252 Old myocardial infarction: Secondary | ICD-10-CM | POA: Diagnosis not present

## 2022-06-13 DIAGNOSIS — I4819 Other persistent atrial fibrillation: Secondary | ICD-10-CM | POA: Diagnosis not present

## 2022-06-13 DIAGNOSIS — I255 Ischemic cardiomyopathy: Secondary | ICD-10-CM | POA: Diagnosis not present

## 2022-06-13 NOTE — Progress Notes (Unsigned)
Cardiology Office Note:    Date:  06/18/2022   ID:  Brendan Butler, DOB [birth year], MRN [id number]  PCP:  [name], MD  Cardiologist:  Charlton Haws, MD  Electrophysiologist:  None   Referring MD: [name], MD   Chief Complaint: hospital follow-up of atrial flutter and CHF  History of Present Illness:    Brendan Butler is a 72 y.o. male with a history of  CAD s/p multiple PCIs to LAD (most recently in 2015), chronic HFrEF with EF as low as 35-40% in 12/2012 with normalization to 55-60% in 11/2021 but then down again to 20-25% on TEE in 04/2202, persistent atrial flutter s/p DCCV in 04/2022 and again in 05/2022 on Xarelto, sinus pauses, hypertension, hyperlipidemia, CKD stage III, obstructive sleep apnea, pulmonary nodules, and anxiety/depression who is followed by Dr. Eden Emms and presents today for hospital follow-up of atrial flutter and CHF.   Patient has a long history of CAD with multiple interventions to LAD dating back to 2001. He was admitted in 12/2012 for anterior STEMI and underwent underwent PCI with DES to LAD. He presented back to the hospital one week later for another anterior STEMI complicated by ventricular fibrillation on the cath lab table requiring defibrillation. He was found to have acute in-stent thrombosis of recently placed LAD stent. Intravascular ultrasound was then performed in order to define the mechanism of stent thrombosis. There previously implanted stent appeared well expanded throughout but there was an area of mal apposition in the distal stent. He underwent successful PCI with balloon angioplasty of the entire stented segment with 0% residual stenosis. Echo at that time showed LVEF of 40-45% with hypokinesis sof the anterior, anteroseptal, and apical segments consistent with LAD territory infarct. He was initially treated with IV Amiodarone given ventricular fibrillation but had no recurrence after revascularization so this was ultimately stopped.  He was switched from [place] to Effient at that time. Repeat LHC in 03/2013 for recurrent chest pain showed 70% in-stent restenosis of prior LAD and underwent repeat balloon angioplasty. He was lost to follow-up in our office after 2015 except for being seen in consult in the hospital in 2017 for chest pain (felt to be atypical) and in 11/2021 for pre-op evaluation for lap chole in setting of acute cholecystitis and multiple episodes of near syncope/ syncope with bradycardia noted in the ED. Echo at that time showed normalization of EF to 55-60% with hypokinesis of the entire anterior septum and grade 1 diastolic dysfunction. He was felt to be at acceptable risk for surgery and had no evidence of high grade AV block during admission. He was again lost to follow-up after this admission and not seen again until his most recent hospitalization.  He was admitted on 05/06/2022 for altered mental status and delusional thoughts and was incidentally found to be in new onset rapid atrial flutter. He was started on IV Cardizem and Eliquis and then underwent successful TEE/ DCCV on 05/10/2022 with return of sinus rhythm. TEE showed newly reduced EF of 20-25% with global hypokinesis.  He left AMA later that day before medications could be optimized. However, Effient was switched to Plavix given need for Eliquis. He was readmitted on 05/20/2022 for worsening altered mental status ad found to be back in atrial flutter with RVR. He had not been compliant with his anticoagulation. He was switched from Eliquis to Xarelto to help encourage compliance and was started on IV Amiodarone and Metoprolol. Amiodarone was discontinued due to concern about long-term  use of this in the setting of non-compliance. He remained rate controlled without the Amiodarone so the initial plan was to wait on repeat DCCV until he could prove compliance with anticoagulation as an outpatient. GDMT was adjusted.  He was noted to have some pauses on telemetry so  a live Zio monitor was placed and plan was for discharge to SNF on 05/24/2022; however, while waiting for transportation to SNF, he was noted to have a 4 second pause followed by a 5.8 second pause.  Therefore, his discharge was cancelled and his beta-blocker was stopped. EP was consulted and he was restarted on IV Amiodarone and then underwent repeat TEE/DCCV on 05/28/2022 with restoration of sinus rhythm. EP recommended avoiding permament pacemaker if at all possible given compliance issues. He was switched to PO Amiodarone and no recurrent pauses were noted. Of note, Plavix was also stopped this admission.  He was seen by Alphonzo Severance, PA-C, in the [facility] on 06/14/2022 and was maintaining sinus rhythm at that time. He did report some lower extremity edema since discharge and PCP had started him on PRN Lasix.   Patient presents today for follow-up. Patient is doing better since discharge. He remains in sinus rhythm today. His altered mental status has improved. His main complaints today is lower extremity swelling. He states he has been having lower extremity edema prior to recent hospitalization but it has been getting worse after discharge. His PCP initially started him on PRN Lasix  without significant improvement so 3 days ago he increased the dose to  every other day. He also states his weight has been fluctuating from 212 to 217 lbs at home. He weighs 215 lbs today in the office. His discharge weight was 202 lbs. He denies any other symptoms. No chest pain, shortness of breath, orthopnea, PND, palpitations, lightheadedness, dizziness, or syncope. No abnormal bleeding on Xarelto.   Past Medical History:  Diagnosis Date   Acute myocardial infarction of other anterior wall, initial episode of care 12/29/2012   Promus stent to LAD   Acute ST segment elevation MI 01/05/2013   secondary to thrombus in stent; Brilintta changed to Effient pt with ASA allergy   Coronary artery disease 2001    stent to LAD and patent 2007 on cath   Echocardiogram abnormal 01/06/2013   EF 45-50%, grade I diastolic dysfunction   Erectile dysfunction    Hyperlipidemia LDL goal <70    Hypertension    OSA (obstructive sleep apnea)    No CPAP use overnight    Past Surgical History:  Procedure Laterality Date   BACK SURGERY  2007   Decompression lumbar laminectomy and micro discectomy, L4-5   BRONCHIAL BIOPSY  12/28/2021   Procedure: BRONCHIAL BIOPSIES;  Surgeon: Leslye Peer, MD;  Location: [facility] ENDOSCOPY;  Service: Pulmonary;;   BRONCHIAL BRUSHINGS  12/28/2021   Procedure: BRONCHIAL BRUSHINGS;  Surgeon: Leslye Peer, MD;  Location: [facility] ENDOSCOPY;  Service: Pulmonary;;   BRONCHIAL NEEDLE ASPIRATION BIOPSY  12/28/2021   Procedure: BRONCHIAL NEEDLE ASPIRATION BIOPSIES;  Surgeon: Leslye Peer, MD;  Location: [facility] ENDOSCOPY;  Service: Pulmonary;;   BRONCHIAL WASHINGS  12/28/2021   Procedure: BRONCHIAL WASHINGS;  Surgeon: Leslye Peer, MD;  Location: [facility] ENDOSCOPY;  Service: Pulmonary;;   CARDIAC CATHETERIZATION  2007   patent stent to LAD and normal Cors   CARDIOVERSION N/A 05/10/2022   Procedure: CARDIOVERSION;  Surgeon: Sande Rives, MD;  Location: [facility] ENDOSCOPY;  Service: Cardiovascular;  Laterality: N/A;   CARDIOVERSION  N/A 05/28/2022   Procedure: CARDIOVERSION;  Surgeon: Wendall Stade, MD;  Location: [facility] ENDOSCOPY;  Service: Cardiovascular;  Laterality: N/A;   CHOLECYSTECTOMY N/A 12/26/2021   Procedure: LAPAROSCOPIC CHOLECYSTECTOMY, LAPAROSCOPIC LYSIS OF ADHESIONS GREATER THAN 1 HOUR;  Surgeon: [name], MD;  Location: [facility] OR;  Service: General;  Laterality: N/A;   CORONARY ANGIOPLASTY WITH STENT PLACEMENT  2001   to LAD   CORONARY ANGIOPLASTY WITH STENT PLACEMENT  12/29/12   STEMI with promus DES to LAD   CORONARY ANGIOPLASTY WITH STENT PLACEMENT  01/05/13   STEMI with thrombosis in stent to LAD   FIDUCIAL MARKER PLACEMENT  12/28/2021   Procedure: FIDUCIAL MARKER PLACEMENT;   Surgeon: Leslye Peer, MD;  Location: [facility] ENDOSCOPY;  Service: Pulmonary;;   LEFT HEART CATH N/A 12/29/2012   Procedure: LEFT HEART CATH;  Surgeon: Corky Crafts, MD;  Location: [facility] CATH LAB;  Service: Cardiovascular;  Laterality: N/A;   LEFT HEART CATHETERIZATION WITH CORONARY ANGIOGRAM N/A 01/05/2013   Procedure: LEFT HEART CATHETERIZATION WITH CORONARY ANGIOGRAM;  Surgeon: Micheline Chapman, MD;  Location: [facility] CATH LAB;  Service: Cardiovascular;  Laterality: N/A;   LEFT HEART CATHETERIZATION WITH CORONARY ANGIOGRAM N/A 04/22/2013   Procedure: LEFT HEART CATHETERIZATION WITH CORONARY ANGIOGRAM;  Surgeon: Lennette Bihari, MD;  Location: [facility] CATH LAB;  Service: Cardiovascular;  Laterality: N/A;   PARTIAL NEPHRECTOMY Left 1975   PATIENT ONLY HAS ONE KIDNEY   PERCUTANEOUS CORONARY STENT INTERVENTION (PCI-S)  12/29/2012   Procedure: PERCUTANEOUS CORONARY STENT INTERVENTION (PCI-S);  Surgeon: Corky Crafts, MD;  Location: [facility] CATH LAB;  Service: Cardiovascular;;   TEE WITHOUT CARDIOVERSION N/A 05/10/2022   Procedure: TRANSESOPHAGEAL ECHOCARDIOGRAM (TEE);  Surgeon: Sande Rives, MD;  Location: [facility] ENDOSCOPY;  Service: Cardiovascular;  Laterality: N/A;   TEE WITHOUT CARDIOVERSION N/A 05/28/2022   Procedure: TRANSESOPHAGEAL ECHOCARDIOGRAM (TEE);  Surgeon: Wendall Stade, MD;  Location: [facility] ENDOSCOPY;  Service: Cardiovascular;  Laterality: N/A;    Current Medications: Current Meds  Medication Sig   buPROPion (WELLBUTRIN XL) 150 MG 24 hr tablet Take 1 tablet (150 mg total) by mouth daily.   Cyanocobalamin (VITAMIN B-12 IJ) Inject as directed every 30 (thirty) days.   fluticasone (FLONASE) 50 MCG/ACT nasal spray Place 1 spray into both nostrils daily.   pantoprazole (PROTONIX) 40 MG tablet Take 1 tablet (40 mg total) by mouth daily.   PARoxetine (PAXIL) 30 MG tablet Take 30 mg by mouth daily.   potassium chloride (KLOR-CON M) 10 MEQ tablet Take 1 tablet (10 mEq total) by mouth daily as  needed (take wtih lasix only).   pregabalin (LYRICA) 150 MG capsule Take 1 capsule (150 mg total) by mouth 2 (two) times daily.   QUEtiapine (SEROQUEL) 25 MG tablet Take 1 tablet (25 mg total) by mouth at bedtime.   [DISCONTINUED] amiodarone (PACERONE) 200 MG tablet Take 1 tablet (200 mg total) by mouth daily.   [DISCONTINUED] cyanocobalamin (VITAMIN B12) 1000 MCG tablet Take 1 tablet (1,000 mcg total) by mouth daily.   [DISCONTINUED] empagliflozin (JARDIANCE) 10 MG TABS tablet Take 1 tablet (10 mg total) by mouth daily.   [DISCONTINUED] furosemide (LASIX) 20 MG tablet Take 1 tablet (20 mg total) by mouth daily as needed (swelling/wt gain).   [DISCONTINUED] rivaroxaban (XARELTO) 20 MG TABS tablet Take 1 tablet (20 mg total) by mouth daily with supper.   [DISCONTINUED] rosuvastatin (CRESTOR) 20 MG tablet Take 1 tablet (20 mg total) by mouth daily.   [DISCONTINUED] sacubitril-valsartan (ENTRESTO) 24-26 MG Take  1 tablet by mouth 2 (two) times daily.   [DISCONTINUED] spironolactone (ALDACTONE) 25 MG tablet Take 1/2 tablet (12.5 mg total) by mouth daily.     Allergies:   Levitra [vardenafil], Aspirin, and Lipitor [atorvastatin]   Social History   Socioeconomic History   Marital status: Divorced    Spouse name: Not on file   Number of children: Not on file   Years of education: Not on file   Highest education level: Not on file  Occupational History   Not on file  Tobacco Use   Smoking status: Former    Packs/day: 2.00    Years: 25.00    Additional pack years: 0.00    Total pack years: 50.00    Types: Cigarettes    Quit date: 2001    Years since quitting: 23.3   Smokeless tobacco: Current    Types: Chew   Tobacco comments:    Currently Dip Tobacco     Former smoker 06/14/22  Vaping Use   Vaping Use: Never used  Substance and Sexual Activity   Alcohol use: Not Currently    Comment: 2001 stopped drinking   Drug use: Yes    Types: Marijuana    Comment: 1-2 aweek 06/13/25    Sexual activity: Not on file  Other Topics Concern   Not on file  Social History Narrative   Not on file   Social Determinants of Health   Financial Resource Strain: Not on file  Food Insecurity: No Food Insecurity (05/21/2022)   Hunger Vital Sign    Worried About Running Out of Food in the Last Year: Never true    Ran Out of Food in the Last Year: Never true  Transportation Needs: No Transportation Needs (05/21/2022)   PRAPARE - Administrator, Civil Service (Medical): No    Lack of Transportation (Non-Medical): No  Physical Activity: Not on file  Stress: Not on file  Social Connections: Not on file     Family History: The patient's family history includes Alzheimer's disease in his mother; CAD in his father.  ROS:   Please see the history of present illness.     EKGs/Labs/Other Studies Reviewed:    The following studies were reviewed:  TTE 12/26/2021: Impressions: 1. Left ventricular ejection fraction, by estimation, is 55 to 60%. The  left ventricle has normal function. The left ventricle demonstrates  regional wall motion abnormalities (suggestive of prior anteroseptal/LAD  infarct). The left ventricular internal  cavity size was mildly dilated. Left ventricular diastolic parameters are  consistent with Grade I diastolic dysfunction (impaired relaxation).   2. Right ventricular systolic function is normal. The right ventricular  size is normal. Tricuspid regurgitation signal is inadequate for assessing  PA pressure.   3. The mitral valve is grossly normal. No evidence of mitral valve  regurgitation. No evidence of mitral stenosis.   4. The aortic valve is tricuspid. Aortic valve regurgitation is not  visualized. No aortic stenosis is present.   Comparison(s): LVEF has improve from prior reporting.  _______________  TEE 05/10/2022: Impressions: 1. Successful TEE/DCCV with return to NSR. No LAA thrombus. LVEF 20-25%.  Global HK.   2. Left ventricular  ejection fraction, by estimation, is 20 to 25%. The  left ventricle has severely decreased function.   3. Right ventricular systolic function is mildly reduced. The right  ventricular size is normal.   4. Left atrial size was moderately dilated. No left atrial/left atrial  appendage thrombus was detected. The LAA  emptying velocity was 39 cm/s.   5. Right atrial size was mildly dilated.   6. The mitral valve is grossly normal. Mild mitral valve regurgitation.  No evidence of mitral stenosis.   7. The aortic valve is tricuspid. Aortic valve regurgitation is not  visualized. No aortic stenosis is present.   8. There is Moderate (Grade III) layered plaque involving the descending  aorta.   _______________  TEE 05/28/2022: Impressions: 1. No LAA thrombus DCC x 1 with 150 J biphasic converted to NSR.   2. Left ventricular ejection fraction, by estimation, is 25 to 30%. The  left ventricle has severely decreased function. The left ventricle  demonstrates global hypokinesis. The left ventricular internal cavity size  was moderately dilated.   3. Right ventricular systolic function is normal. The right ventricular  size is normal.   4. Left atrial size was moderately dilated. No left atrial/left atrial  appendage thrombus was detected.   5. The mitral valve is normal in structure. Mild mitral valve  regurgitation. No evidence of mitral stenosis.   6. The aortic valve is tricuspid. Aortic valve regurgitation is not  visualized. No aortic stenosis is present.   7. The inferior vena cava is normal in size with greater than 50%  respiratory variability, suggesting right atrial pressure of 3 mmHg.   Conclusion(s)/Recommendation(s): Normal biventricular function without  evidence of hemodynamically significant valvular heart disease.    EKG:  EKG ordered today. EKG personally reviewed and demonstrates sinus bradycardia, rate 59 bpm, with LVH and some underlying baseline wandering but no acute  ST/T changes. Borderline left axis deviation. Normal PR and QRS intervals. QTc 415 ms.  Recent Labs: 05/06/2022: B Natriuretic Peptide 36.5; TSH 1.856 05/23/2022: ALT 21; Magnesium 2.1 05/26/2022: Hemoglobin 12.6; Platelets 241 05/29/2022: BUN 14; Creatinine, Ser 1.40; Potassium 3.8; Sodium 141  Recent Lipid Panel    Component Value Date/Time   CHOL 139 05/24/2022 0051   TRIG 182 (H) 05/24/2022 0051   HDL 27 (L) 05/24/2022 0051   CHOLHDL 5.1 05/24/2022 0051   VLDL 36 05/24/2022 0051   LDLCALC 76 05/24/2022 0051    Physical Exam:    Vital Signs: BP (!) 118/58   Pulse (!) 59   Ht 5\' 10"  (1.778 m)   Wt 215 lb (97.5 kg)   SpO2 96%   BMI 30.85 kg/m     Wt Readings from Last 3 Encounters:  06/18/22 215 lb (97.5 kg)  06/14/22 216 lb 3.2 oz (98.1 kg)  05/29/22 202 lb 6.1 oz (91.8 kg)     General: 72 y.o. Caucasian male in no acute distress. HEENT: Normocephalic and atraumatic. Sclera clear. EOMs intact. Neck: Supple.  No JVD. Heart: RRR. Distinct S1 and S2. No murmurs, gallops, or rubs.  Lungs: No increased work of breathing. Clear to ausculation bilaterally. No wheezes, rhonchi, or rales.  Abdomen: Soft, non-distended, and non-tender to palpation.  Extremities: 2-3+ tight pitting edema of right lower extremity edema and 1+ edema of left lower extremity.  Skin: Warm and dry. Neuro: Alert and oriented x3. No focal deficits. Psych: Normal affect. Responds appropriately.  Assessment:    1. Atrial flutter, unspecified type   2. Sinus pause   3. Coronary artery disease involving native coronary artery of native heart without angina pectoris   4. Chronic HFrEF (heart failure with reduced ejection fraction)   5. Lower extremity edema   6. Primary hypertension   7. Hyperlipidemia, unspecified hyperlipidemia type   8. Stage 3 chronic kidney disease,  unspecified whether stage 3a or 3b CKD     Plan:    Persistent Atrial Flutter Pauses Initially diagnosed during recent admission  and underwent successful TEE/DCCV on 05/10/2022. He was noted to be back in atrial flutter when he was readmitted on 05/20/2022 for altered mental status. His beta-blocker was increased but then was noted to have increasingly longer pauses (4 second pause followed immediately by a 5.8 second pause). Therefore, beta-blocker was stopped and he was started on Amiodarone and then underwent repeat TEE/DCCV 05/28/2022 with restoration of sinus rhythm and no recurrent pauses. - Maintaining sinus rhythm.  - Continue Amiodarone 200mg  daily. - Continue anticoagulation with Xarelto on 20mg  daily. - Follow-up in the A.Fib Clinic as directed.  CAD Patient has a long history of single vessel CAD involving the LAD s/p multiple PCIs dating back to 2001. Last PCI was in angioplasty for in-stent restenosis of LAD in 2015.  - No chest pain.  - No antiplatelet therapy given need for full anticoagulation. - Continue statin.  Chronic HFrEF Initial diagnosed in 12/2012 in setting of anterior MI with EF of 35-40% at that time. EF later normalized to 55-60% on TTE in 102023. However, TEE during recent admission in 04/2022 showed new drop in EF to 20-25%. This was felt to likely be tachy-mediated in nature.  - Weight is up 13 lbs since discharge and he has lower extremity edema on exam (right leg significantly worse than the left). However, he denies any shortness of breath, orthopnea, or PND and lungs are clear on exam.  - Will increase Lasix to 40mg  once daily.  - Continue Entresto 24-26mg  twice daily.  - Continue Spironolactone 12.5mg  daily.  - Continue Jardiance 10mg  daily.  - No beta-blocker given history of long pauses on Metoprolol. - Discussed imporatnce of daily weights and sodium/ fluid restrictions.  - Will repeat BNP and BMET today.  - Will repeat Echo in 2-3 months to reassess LV function after restoration of sinus rhythm and optimization of GDMT. If no improvement in EF, will need to consider repeat ischemic  evaluation.  Lower Extremity Edema Patient has significant lower extremity edema on exam (right > left). He states he has been having edema since prior to his most recent hospitalization. However, I saw him in the hospital and he did not have any edema in the hospital. - Will increase Lasix as above. - He is on Xarelto which significantly lowers his chance of having a DVT but there have been compliant issues in the past (although daughter is now helping him with his medications). Will order d-dimer at sister's request to rule out DVT. However, explained that if this is positive, he will need a lower extremity venous doppler.   Hypertension BP well controlled.  - Continue medications for CHF as above.  Hyperlipidemia Lipid panel in 04/2022: Total Cholesterol 139, triglycerides 182, HDL 27, LDL 76. LDL goal <55 given multiple MIs.  - Crestor was increased to 20mg  daily during recent admission. Continue.  - Will repeat lipid panel and LFTs in about 1 month.   CKD Stage III Baseline creatinine around 1.3 to 1.8. Stable at 1.40 on discharge on 05/29/2022.  - Will repeat BMET today.   Disposition: Follow up in 3 months after Echo.   Medication Adjustments/Labs and Tests Ordered: Current medicines are reviewed at length with the patient today.  Concerns regarding medicines are outlined above.  Orders Placed This Encounter  Procedures   Brain natriuretic peptide   Basic metabolic panel  D-dimer, quantitative   Lipid panel   Hepatic function panel   EKG 12-Lead   ECHOCARDIOGRAM COMPLETE   Meds ordered this encounter  Medications   spironolactone (ALDACTONE) 25 MG tablet    Sig: Take 1/2 tablet (12.5 mg total) by mouth daily.    Dispense:  45 tablet    Refill:  3   sacubitril-valsartan (ENTRESTO) 24-26 MG    Sig: Take 1 tablet by mouth 2 (two) times daily.    Dispense:  180 tablet    Refill:  3   rosuvastatin (CRESTOR) 20 MG tablet    Sig: Take 1 tablet (20 mg total) by mouth daily.     Dispense:  90 tablet    Refill:  3   rivaroxaban (XARELTO) 20 MG TABS tablet    Sig: Take 1 tablet (20 mg total) by mouth daily with supper.    Dispense:  90 tablet    Refill:  3   empagliflozin (JARDIANCE) 10 MG TABS tablet    Sig: Take 1 tablet (10 mg total) by mouth daily.    Dispense:  90 tablet    Refill:  3   amiodarone (PACERONE) 200 MG tablet    Sig: Take 1 tablet (200 mg total) by mouth daily.    Dispense:  90 tablet    Refill:  3   furosemide (LASIX) 40 MG tablet    Sig: Take 1 tablet (40 mg total) by mouth daily.    Dispense:  90 tablet    Refill:  3    Dose change new Rx    Patient Instructions  Medication Instructions:   INCREASE Lasix to 40 mg daily   *If you need a refill on your cardiac medications before your next appointment, please call your pharmacy*  Lab Work: Marjie Skiff, PA-C recommends that you have lab work TODAY:  BMP BNP D-Dimer   Marjie Skiff, PA-C recommends that you return for lab work in 1 month:  Fasting Lipid Panel-DO NOT eat or drink past midnight. Okay to have water and black coffee only the morning of lab work  Hepatic (Liver) Function Test  If you have labs (blood work) drawn today and your tests are completely normal, you will receive your results only by: MyChart Message (if you have MyChart) OR A paper copy in the mail If you have any lab test that is abnormal or we need to change your treatment, we will call you to review the results.  Testing/Procedures: Your physician has requested that you have an echocardiogram. Echocardiography is a painless test that uses sound waves to create images of your heart. It provides your doctor with information about the size and shape of your heart and how well your heart's chambers and valves are working. This procedure takes approximately one hour. There are no restrictions for this procedure. Please do NOT wear cologne, perfume, aftershave, or lotions (deodorant is allowed). Please  arrive 15 minutes prior to your appointment time.  Please scheduled for 2-3 months   Follow-Up: At [facility], you and your health needs are our priority.  As part of our continuing mission to provide you with exceptional heart care, we have created designated Provider Care Teams.  These Care Teams include your primary Cardiologist (physician) and Advanced Practice Providers (APPs -  Physician Assistants and Nurse Practitioners) who all work together to provide you with the care you need, when you need it.    Your next appointment:   2-3 month(s)  Provider:  Marjie Skiff, PA-C        Other Instructions    Signed, Smitty Knudsen  06/18/2022 6:24 PM    [facility]

## 2022-06-14 ENCOUNTER — Ambulatory Visit (HOSPITAL_COMMUNITY)
Admission: RE | Admit: 2022-06-14 | Discharge: 2022-06-14 | Disposition: A | Discharge status: Home / Self Care | Payer: Medicare HMO | Source: Ambulatory Visit | Attending: Physician Assistant | Admitting: Physician Assistant

## 2022-06-14 ENCOUNTER — Encounter (HOSPITAL_COMMUNITY): Payer: Self-pay | Admitting: Physician Assistant

## 2022-06-14 VITALS — BP 124/80 | HR 60 | Ht 70.0 in | Wt 216.2 lb

## 2022-06-14 DIAGNOSIS — I2510 Atherosclerotic heart disease of native coronary artery without angina pectoris: Secondary | ICD-10-CM | POA: Diagnosis not present

## 2022-06-14 DIAGNOSIS — D6869 Other thrombophilia: Secondary | ICD-10-CM | POA: Diagnosis not present

## 2022-06-14 DIAGNOSIS — Z79899 Other long term (current) drug therapy: Secondary | ICD-10-CM | POA: Diagnosis not present

## 2022-06-14 DIAGNOSIS — G4733 Obstructive sleep apnea (adult) (pediatric): Secondary | ICD-10-CM | POA: Insufficient documentation

## 2022-06-14 DIAGNOSIS — I110 Hypertensive heart disease with heart failure: Secondary | ICD-10-CM | POA: Diagnosis not present

## 2022-06-14 DIAGNOSIS — E785 Hyperlipidemia, unspecified: Secondary | ICD-10-CM | POA: Insufficient documentation

## 2022-06-14 DIAGNOSIS — Z7901 Long term (current) use of anticoagulants: Secondary | ICD-10-CM | POA: Diagnosis not present

## 2022-06-14 DIAGNOSIS — I483 Typical atrial flutter: Secondary | ICD-10-CM

## 2022-06-14 DIAGNOSIS — I252 Old myocardial infarction: Secondary | ICD-10-CM | POA: Diagnosis not present

## 2022-06-14 DIAGNOSIS — I5022 Chronic systolic (congestive) heart failure: Secondary | ICD-10-CM | POA: Diagnosis not present

## 2022-06-14 DIAGNOSIS — I440 Atrioventricular block, first degree: Secondary | ICD-10-CM | POA: Insufficient documentation

## 2022-06-14 MED ORDER — FUROSEMIDE 20 MG PO TABS: 20.0000 mg | ORAL_TABLET | Freq: Every day | ORAL | Status: DC | PRN

## 2022-06-14 MED ORDER — POTASSIUM CHLORIDE CRYS ER 10 MEQ PO TBCR: 10.0000 meq | EXTENDED_RELEASE_TABLET | Freq: Every day | ORAL | Status: DC | PRN

## 2022-06-14 MED ORDER — RIVAROXABAN 20 MG PO TABS: 20.0000 mg | ORAL_TABLET | Freq: Every day | ORAL | 6 refills | Status: DC

## 2022-06-14 MED ORDER — AMIODARONE HCL 200 MG PO TABS: 200.0000 mg | ORAL_TABLET | Freq: Every day | ORAL | 6 refills | Status: DC

## 2022-06-14 NOTE — Progress Notes (Signed)
Primary Care Physician: [name], MD Primary Cardiologist: Dr Eden Emms Primary Electrophysiologist: Dr Lalla Brothers Referring Physician: Dr Nicoletta Ba is a 72 y.o. male with a history of CAD, HLD, HTN, OSA, chronic HFrEF, CKD, intermittent AMS, atrial flutter, atrial fibrillation who presents for follow up in the [facility]. Patient was admitted earlier this month for altered mental status and incidentally found to have new onset atrial flutter with RVR. He underwent successful TEE/ DCCV with restoration of sinus rhythm. TEE showed EF had dropped from 55-60% in 11/2021 to 20-25%. He left AMA before medications could be optimized. He re-presented on 05/20/2022 with worsening mental status and was back in atrial flutter with RVR. Rates were initially in the 130s.    Rates improved on IV diltiazem. He was initially started on amiodarone given reduced EF, but amiodarone was then discontinued given concern for medication compliance.   Pauses on telemetry were noted up to 4.2 seconds (asymptomatic) and Toprol was decreased.   A Zio monitor was applied and plan was to d/c to SNF when pt had a 4 second pauses *beat* 5 second pause back to back. Discharge was held with plans for EP consultation. Seen by Dr Lalla Brothers, BB discontinued, which resolved the pauses. Patient not felt to be a candidate for invasive EP procedures at that time.   Patient is on Xarelto for a CHADS2VASC score of 4.  On follow up today, patient reports that he feels well. He remains in SR. He did have some lower extremity edema since leaving the hospital and was started on PRN lasix and KCL by his PCP. No bleeding issues on anticoagulation. His daughter who accompanies him today has been helping him with his medications.   Today, he denies symptoms of palpitations, chest pain, shortness of breath, orthopnea, PND, lower extremity edema, dizziness, presyncope, syncope, snoring, daytime  somnolence, bleeding, or neurologic sequela. The patient is tolerating medications without difficulties and is otherwise without complaint today.    Atrial Fibrillation Risk Factors:  he does have symptoms or diagnosis of sleep apnea. he is not compliant with CPAP therapy. he does not have a history of rheumatic fever.   he has a BMI of Body mass index is 31.02 kg/m.Marland Kitchen Filed Weights   06/14/22 1429  Weight: 98.1 kg    Family History  Problem Relation Age of Onset   Alzheimer's disease Mother    CAD Father      Atrial Fibrillation Management history:  Previous antiarrhythmic drugs: amiodarone  Previous cardioversions: 05/10/22, 05/28/22 Previous ablations: none Anticoagulation history: Eliquis, Xarelto    Past Medical History:  Diagnosis Date   Acute myocardial infarction of other anterior wall, initial episode of care 12/29/2012   Promus stent to LAD   Acute ST segment elevation MI 01/05/2013   secondary to thrombus in stent; Brilintta changed to Effient pt with ASA allergy   Coronary artery disease 2001   stent to LAD and patent 2007 on cath   Echocardiogram abnormal 01/06/2013   EF 45-50%, grade I diastolic dysfunction   Erectile dysfunction    Hyperlipidemia LDL goal <70    Hypertension    OSA (obstructive sleep apnea)    No CPAP use overnight   Past Surgical History:  Procedure Laterality Date   BACK SURGERY  2007   Decompression lumbar laminectomy and micro discectomy, L4-5   BRONCHIAL BIOPSY  12/28/2021   Procedure: BRONCHIAL BIOPSIES;  Surgeon: Leslye Peer, MD;  Location:  [facility] ENDOSCOPY;  Service: Pulmonary;;   BRONCHIAL BRUSHINGS  12/28/2021   Procedure: BRONCHIAL BRUSHINGS;  Surgeon: Leslye Peer, MD;  Location: [facility] ENDOSCOPY;  Service: Pulmonary;;   BRONCHIAL NEEDLE ASPIRATION BIOPSY  12/28/2021   Procedure: BRONCHIAL NEEDLE ASPIRATION BIOPSIES;  Surgeon: Leslye Peer, MD;  Location: [facility] ENDOSCOPY;  Service: Pulmonary;;   BRONCHIAL WASHINGS   12/28/2021   Procedure: BRONCHIAL WASHINGS;  Surgeon: Leslye Peer, MD;  Location: [facility] ENDOSCOPY;  Service: Pulmonary;;   CARDIAC CATHETERIZATION  2007   patent stent to LAD and normal Cors   CARDIOVERSION N/A 05/10/2022   Procedure: CARDIOVERSION;  Surgeon: Sande Rives, MD;  Location: [facility] ENDOSCOPY;  Service: Cardiovascular;  Laterality: N/A;   CARDIOVERSION N/A 05/28/2022   Procedure: CARDIOVERSION;  Surgeon: Wendall Stade, MD;  Location: [facility] ENDOSCOPY;  Service: Cardiovascular;  Laterality: N/A;   CHOLECYSTECTOMY N/A 12/26/2021   Procedure: LAPAROSCOPIC CHOLECYSTECTOMY, LAPAROSCOPIC LYSIS OF ADHESIONS GREATER THAN 1 HOUR;  Surgeon: [name], MD;  Location: [facility] OR;  Service: General;  Laterality: N/A;   CORONARY ANGIOPLASTY WITH STENT PLACEMENT  2001   to LAD   CORONARY ANGIOPLASTY WITH STENT PLACEMENT  12/29/12   STEMI with promus DES to LAD   CORONARY ANGIOPLASTY WITH STENT PLACEMENT  01/05/13   STEMI with thrombosis in stent to LAD   FIDUCIAL MARKER PLACEMENT  12/28/2021   Procedure: FIDUCIAL MARKER PLACEMENT;  Surgeon: Leslye Peer, MD;  Location: [facility] ENDOSCOPY;  Service: Pulmonary;;   LEFT HEART CATH N/A 12/29/2012   Procedure: LEFT HEART CATH;  Surgeon: Corky Crafts, MD;  Location: [facility] CATH LAB;  Service: Cardiovascular;  Laterality: N/A;   LEFT HEART CATHETERIZATION WITH CORONARY ANGIOGRAM N/A 01/05/2013   Procedure: LEFT HEART CATHETERIZATION WITH CORONARY ANGIOGRAM;  Surgeon: Micheline Chapman, MD;  Location: [facility] CATH LAB;  Service: Cardiovascular;  Laterality: N/A;   LEFT HEART CATHETERIZATION WITH CORONARY ANGIOGRAM N/A 04/22/2013   Procedure: LEFT HEART CATHETERIZATION WITH CORONARY ANGIOGRAM;  Surgeon: Lennette Bihari, MD;  Location: [facility] CATH LAB;  Service: Cardiovascular;  Laterality: N/A;   PARTIAL NEPHRECTOMY Left 1975   PATIENT ONLY HAS ONE KIDNEY   PERCUTANEOUS CORONARY STENT INTERVENTION (PCI-S)  12/29/2012   Procedure: PERCUTANEOUS CORONARY STENT  INTERVENTION (PCI-S);  Surgeon: Corky Crafts, MD;  Location: [facility] CATH LAB;  Service: Cardiovascular;;   TEE WITHOUT CARDIOVERSION N/A 05/10/2022   Procedure: TRANSESOPHAGEAL ECHOCARDIOGRAM (TEE);  Surgeon: Sande Rives, MD;  Location: [facility] ENDOSCOPY;  Service: Cardiovascular;  Laterality: N/A;   TEE WITHOUT CARDIOVERSION N/A 05/28/2022   Procedure: TRANSESOPHAGEAL ECHOCARDIOGRAM (TEE);  Surgeon: Wendall Stade, MD;  Location: [facility] ENDOSCOPY;  Service: Cardiovascular;  Laterality: N/A;    Current Outpatient Medications  Medication Sig Dispense Refill   amiodarone (PACERONE) 200 MG tablet Take 1 tablet (200 mg total) by mouth daily. 30 tablet 0   buPROPion (WELLBUTRIN XL) 150 MG 24 hr tablet Take 1 tablet (150 mg total) by mouth daily. 30 tablet 2   cyanocobalamin (VITAMIN B12) 1000 MCG tablet Take 1 tablet (1,000 mcg total) by mouth daily. 30 tablet 0   empagliflozin (JARDIANCE) 10 MG TABS tablet Take 1 tablet (10 mg total) by mouth daily. 30 tablet 0   fluticasone (FLONASE) 50 MCG/ACT nasal spray Place 1 spray into both nostrils daily.     pantoprazole (PROTONIX) 40 MG tablet Take 1 tablet (40 mg total) by mouth daily. 30 tablet 0   PARoxetine (PAXIL) 30 MG tablet Take 30 mg by mouth daily.  pregabalin (LYRICA) 150 MG capsule Take 1 capsule (150 mg total) by mouth 2 (two) times daily.     QUEtiapine (SEROQUEL) 25 MG tablet Take 1 tablet (25 mg total) by mouth at bedtime. 30 tablet 0   rivaroxaban (XARELTO) 20 MG TABS tablet Take 1 tablet (20 mg total) by mouth daily with supper. 30 tablet 0   rosuvastatin (CRESTOR) 20 MG tablet Take 1 tablet (20 mg total) by mouth daily. 30 tablet 0   sacubitril-valsartan (ENTRESTO) 24-26 MG Take 1 tablet by mouth 2 (two) times daily. 60 tablet 0   spironolactone (ALDACTONE) 25 MG tablet Take 1/2 tablet (12.5 mg total) by mouth daily. 15 tablet 0   No current facility-administered medications for this encounter.    Allergies  Allergen Reactions    Levitra [Vardenafil] Other (See Comments)    Blurred vision   Aspirin Hives   Lipitor [Atorvastatin] Other (See Comments)    Muscle cramps; patient states he does not take medication at home    Social History   Socioeconomic History   Marital status: Divorced    Spouse name: Not on file   Number of children: Not on file   Years of education: Not on file   Highest education level: Not on file  Occupational History   Not on file  Tobacco Use   Smoking status: Former    Packs/day: 2.00    Years: 25.00    Additional pack years: 0.00    Total pack years: 50.00    Types: Cigarettes    Quit date: 2001    Years since quitting: 23.3   Smokeless tobacco: Current    Types: Chew   Tobacco comments:    Currently Dip Tobacco     Former smoker 06/14/22  Vaping Use   Vaping Use: Never used  Substance and Sexual Activity   Alcohol use: Not Currently    Comment: 2001 stopped drinking   Drug use: Yes    Types: Marijuana    Comment: 1-2 aweek 06/13/25   Sexual activity: Not on file  Other Topics Concern   Not on file  Social History Narrative   Not on file   Social Determinants of Health   Financial Resource Strain: Not on file  Food Insecurity: No Food Insecurity (05/21/2022)   Hunger Vital Sign    Worried About Running Out of Food in the Last Year: Never true    Ran Out of Food in the Last Year: Never true  Transportation Needs: No Transportation Needs (05/21/2022)   PRAPARE - Administrator, Civil Service (Medical): No    Lack of Transportation (Non-Medical): No  Physical Activity: Not on file  Stress: Not on file  Social Connections: Not on file  Intimate Partner Violence: Not At Risk (05/21/2022)   Humiliation, Afraid, Rape, and Kick questionnaire    Fear of Current or Ex-Partner: No    Emotionally Abused: No    Physically Abused: No    Sexually Abused: No     ROS- All systems are reviewed and negative except as per the HPI above.  Physical  Exam: Vitals:   06/14/22 1429  BP: 124/80  Pulse: 60  Weight: 98.1 kg  Height:  (1.778 m)    GEN- The patient is a well appearing male, alert and oriented x 3 today.   Head- normocephalic, atraumatic Eyes-  Sclera clear, conjunctiva pink Ears- hearing intact Oropharynx- clear Neck- supple  Lungs- Clear to ausculation bilaterally, normal work of breathing  Heart- Regular rate and rhythm, no murmurs, rubs or gallops  GI- soft, NT, ND, + BS Extremities- no clubbing, cyanosis, 1+ bilateral edema R>L MS- no significant deformity or atrophy Skin- no rash or lesion Psych- euthymic mood, full affect Neuro- strength and sensation are intact  Wt Readings from Last 3 Encounters:  06/14/22 98.1 kg  05/29/22 91.8 kg  05/08/22 93.6 kg    EKG today demonstrates  SR, 1st degree AV block Vent. rate 60 BPM PR interval 208 ms QRS duration 94 ms QT/QTcB 408/408 ms  TEE 05/28/22 demonstrated  1. No LAA thrombus DCC x 1 with 150 J biphasic converted to NSR.   2. Left ventricular ejection fraction, by estimation, is 25 to 30%. The  left ventricle has severely decreased function. The left ventricle  demonstrates global hypokinesis. The left ventricular internal cavity size  was moderately dilated.   3. Right ventricular systolic function is normal. The right ventricular  size is normal.   4. Left atrial size was moderately dilated. No left atrial/left atrial  appendage thrombus was detected.   5. The mitral valve is normal in structure. Mild mitral valve  regurgitation. No evidence of mitral stenosis.   6. The aortic valve is tricuspid. Aortic valve regurgitation is not  visualized. No aortic stenosis is present.   7. The inferior vena cava is normal in size with greater than 50%  respiratory variability, suggesting right atrial pressure of 3 mmHg.   Conclusion(s)/Recommendation(s): Normal biventricular function without  evidence of hemodynamically significant valvular heart  disease.   Epic records are reviewed at length today.  CHA2DS2-VASc Score = 4  The patient's score is based upon: CHF History: 1 HTN History: 1 Diabetes History: 0 Stroke History: 0 Vascular Disease History: 1 Age Score: 1 Gender Score: 0       ASSESSMENT AND PLAN: 1. Atrial flutter The patient's CHA2DS2-VASc score is 4, indicating a 4.8% annual risk of stroke.   Patient appears to be maintaining SR.  Continue amiodarone 200 mg daily.  Will check cmet/TSH at follow up Not felt to be a candidate for invasive EP procedures at this time.  Avoiding BB and CCB due to pauses noted on monitor.  Continue Xarelto 20 mg daily  2. Secondary Hypercoagulable State (ICD10:  D68.69) The patient is at significant risk for stroke/thromboembolism based upon his CHA2DS2-VASc Score of 4.  Continue Apixaban (Eliquis).   3. HTN Stable, no changes today.  4. CAD S/p STEMI 2014 On statin No anginal symptoms.  5. HFrEF TEE showed EF 25-30% Patient started on PRN lasix 20 mg daily by PCP, reviewed PRN instructions.  He does still have some lower extremity edema which daughter says is typical for him. He will take 40 mg of Lasix tomorrow and 20 meq of KCL. Will be cautious with diuresis given h/o CKD s/p nephrectomy.  GDMT per primary cardiology team.   Follow up with Marjie Skiff as scheduled. AF clinic in 6 months.    Jorja Loa PA-C [facility] [address] [place], [place] [address] [phone] 06/14/2022 2:58 PM

## 2022-06-14 NOTE — Addendum Note (Signed)
Encounter addended by: Andee Lineman A on: 06/14/2022 3:47 PM  Actions taken: Imaging Exam ended

## 2022-06-18 ENCOUNTER — Ambulatory Visit: Payer: Medicare HMO | Attending: Physician Assistant | Admitting: Student

## 2022-06-18 ENCOUNTER — Encounter: Payer: Self-pay | Admitting: Student

## 2022-06-18 VITALS — BP 118/58 | HR 59 | Ht 70.0 in | Wt 215.0 lb

## 2022-06-18 DIAGNOSIS — I4892 Unspecified atrial flutter: Secondary | ICD-10-CM

## 2022-06-18 DIAGNOSIS — I5022 Chronic systolic (congestive) heart failure: Secondary | ICD-10-CM | POA: Diagnosis not present

## 2022-06-18 DIAGNOSIS — I455 Other specified heart block: Secondary | ICD-10-CM | POA: Diagnosis not present

## 2022-06-18 DIAGNOSIS — R600 Localized edema: Secondary | ICD-10-CM

## 2022-06-18 DIAGNOSIS — I2510 Atherosclerotic heart disease of native coronary artery without angina pectoris: Secondary | ICD-10-CM | POA: Diagnosis not present

## 2022-06-18 DIAGNOSIS — I10 Essential (primary) hypertension: Secondary | ICD-10-CM | POA: Diagnosis not present

## 2022-06-18 DIAGNOSIS — E785 Hyperlipidemia, unspecified: Secondary | ICD-10-CM | POA: Diagnosis not present

## 2022-06-18 DIAGNOSIS — N183 Chronic kidney disease, stage 3 unspecified: Secondary | ICD-10-CM | POA: Diagnosis not present

## 2022-06-18 MED ORDER — ROSUVASTATIN CALCIUM 20 MG PO TABS: 20.0000 mg | ORAL_TABLET | Freq: Every day | ORAL | 3 refills | Status: DC

## 2022-06-18 MED ORDER — FUROSEMIDE 40 MG PO TABS: 40.0000 mg | ORAL_TABLET | Freq: Every day | ORAL | 3 refills | Status: DC

## 2022-06-18 MED ORDER — SACUBITRIL-VALSARTAN 24-26 MG PO TABS: 1.0000 | ORAL_TABLET | Freq: Two times a day (BID) | ORAL | 3 refills | Status: DC

## 2022-06-18 MED ORDER — EMPAGLIFLOZIN 10 MG PO TABS: 10.0000 mg | ORAL_TABLET | Freq: Every day | ORAL | 3 refills | Status: DC

## 2022-06-18 MED ORDER — RIVAROXABAN 20 MG PO TABS: 20.0000 mg | ORAL_TABLET | Freq: Every day | ORAL | 3 refills | Status: DC

## 2022-06-18 MED ORDER — AMIODARONE HCL 200 MG PO TABS: 200.0000 mg | ORAL_TABLET | Freq: Every day | ORAL | 3 refills | Status: DC

## 2022-06-18 MED ORDER — SPIRONOLACTONE 25 MG PO TABS: 12.5000 mg | ORAL_TABLET | Freq: Every day | ORAL | 3 refills | Status: DC

## 2022-06-18 NOTE — Patient Instructions (Addendum)
Medication Instructions:   INCREASE Lasix to 40 mg daily   *If you need a refill on your cardiac medications before your next appointment, please call your pharmacy*  Lab Work: Marjie Skiff, PA-C recommends that you have lab work TODAY:  BMP BNP D-Dimer   Marjie Skiff, PA-C recommends that you return for lab work in 1 month:  Fasting Lipid Panel-DO NOT eat or drink past midnight. Okay to have water and black coffee only the morning of lab work  Hepatic (Liver) Function Test  If you have labs (blood work) drawn today and your tests are completely normal, you will receive your results only by: MyChart Message (if you have MyChart) OR A paper copy in the mail If you have any lab test that is abnormal or we need to change your treatment, we will call you to review the results.  Testing/Procedures: Your physician has requested that you have an echocardiogram. Echocardiography is a painless test that uses sound waves to create images of your heart. It provides your doctor with information about the size and shape of your heart and how well your heart's chambers and valves are working. This procedure takes approximately one hour. There are no restrictions for this procedure. Please do NOT wear cologne, perfume, aftershave, or lotions (deodorant is allowed). Please arrive 15 minutes prior to your appointment time.  Please scheduled for 2-3 months   Follow-Up: At [facility], you and your health needs are our priority.  As part of our continuing mission to provide you with exceptional heart care, we have created designated Provider Care Teams.  These Care Teams include your primary Cardiologist (physician) and Advanced Practice Providers (APPs -  Physician Assistants and Nurse Practitioners) who all work together to provide you with the care you need, when you need it.    Your next appointment:   2-3 month(s)  Provider:   Marjie Skiff, PA-C        Other Instructions

## 2022-06-19 DIAGNOSIS — I255 Ischemic cardiomyopathy: Secondary | ICD-10-CM | POA: Diagnosis not present

## 2022-06-19 DIAGNOSIS — I4819 Other persistent atrial fibrillation: Secondary | ICD-10-CM | POA: Diagnosis not present

## 2022-06-19 DIAGNOSIS — I4892 Unspecified atrial flutter: Secondary | ICD-10-CM | POA: Diagnosis not present

## 2022-06-19 DIAGNOSIS — S2241XD Multiple fractures of ribs, right side, subsequent encounter for fracture with routine healing: Secondary | ICD-10-CM | POA: Diagnosis not present

## 2022-06-19 DIAGNOSIS — S90931D Unspecified superficial injury of right great toe, subsequent encounter: Secondary | ICD-10-CM | POA: Diagnosis not present

## 2022-06-19 DIAGNOSIS — I129 Hypertensive chronic kidney disease with stage 1 through stage 4 chronic kidney disease, or unspecified chronic kidney disease: Secondary | ICD-10-CM | POA: Diagnosis not present

## 2022-06-19 DIAGNOSIS — I2510 Atherosclerotic heart disease of native coronary artery without angina pectoris: Secondary | ICD-10-CM | POA: Diagnosis not present

## 2022-06-19 DIAGNOSIS — I252 Old myocardial infarction: Secondary | ICD-10-CM | POA: Diagnosis not present

## 2022-06-19 DIAGNOSIS — N1831 Chronic kidney disease, stage 3a: Secondary | ICD-10-CM | POA: Diagnosis not present

## 2022-06-19 LAB — BASIC METABOLIC PANEL
BUN/Creatinine Ratio: 9 — ABNORMAL LOW (ref 10–24)
BUN: 16 mg/dL (ref 8–27)
CO2: 22 mmol/L (ref 20–29)
Calcium: 8.7 mg/dL (ref 8.6–10.2)
Chloride: 105 mmol/L (ref 96–106)
Creatinine, Ser: 1.7 mg/dL — ABNORMAL HIGH (ref 0.76–1.27)
Glucose: 89 mg/dL (ref 70–99)
Potassium: 4.7 mmol/L (ref 3.5–5.2)
Sodium: 142 mmol/L (ref 134–144)
eGFR: 43 mL/min/{1.73_m2} — ABNORMAL LOW (ref >59)

## 2022-06-19 LAB — D-DIMER, QUANTITATIVE: D-DIMER: 0.64 mg/L FEU — ABNORMAL HIGH (ref 0.00–0.49)

## 2022-06-19 LAB — BRAIN NATRIURETIC PEPTIDE: BNP: 38.1 pg/mL (ref 0.0–100.0)

## 2022-06-20 DIAGNOSIS — I252 Old myocardial infarction: Secondary | ICD-10-CM | POA: Diagnosis not present

## 2022-06-20 DIAGNOSIS — I129 Hypertensive chronic kidney disease with stage 1 through stage 4 chronic kidney disease, or unspecified chronic kidney disease: Secondary | ICD-10-CM | POA: Diagnosis not present

## 2022-06-20 DIAGNOSIS — I4892 Unspecified atrial flutter: Secondary | ICD-10-CM | POA: Diagnosis not present

## 2022-06-20 DIAGNOSIS — I2510 Atherosclerotic heart disease of native coronary artery without angina pectoris: Secondary | ICD-10-CM | POA: Diagnosis not present

## 2022-06-20 DIAGNOSIS — S90931D Unspecified superficial injury of right great toe, subsequent encounter: Secondary | ICD-10-CM | POA: Diagnosis not present

## 2022-06-20 DIAGNOSIS — I4819 Other persistent atrial fibrillation: Secondary | ICD-10-CM | POA: Diagnosis not present

## 2022-06-20 DIAGNOSIS — N1831 Chronic kidney disease, stage 3a: Secondary | ICD-10-CM | POA: Diagnosis not present

## 2022-06-20 DIAGNOSIS — S2241XD Multiple fractures of ribs, right side, subsequent encounter for fracture with routine healing: Secondary | ICD-10-CM | POA: Diagnosis not present

## 2022-06-20 DIAGNOSIS — I255 Ischemic cardiomyopathy: Secondary | ICD-10-CM | POA: Diagnosis not present

## 2022-06-26 ENCOUNTER — Other Ambulatory Visit: Payer: Self-pay

## 2022-06-26 DIAGNOSIS — R600 Localized edema: Secondary | ICD-10-CM

## 2022-06-28 DIAGNOSIS — I4819 Other persistent atrial fibrillation: Secondary | ICD-10-CM | POA: Diagnosis not present

## 2022-06-28 DIAGNOSIS — S2241XD Multiple fractures of ribs, right side, subsequent encounter for fracture with routine healing: Secondary | ICD-10-CM | POA: Diagnosis not present

## 2022-06-28 DIAGNOSIS — I252 Old myocardial infarction: Secondary | ICD-10-CM | POA: Diagnosis not present

## 2022-06-28 DIAGNOSIS — S90931D Unspecified superficial injury of right great toe, subsequent encounter: Secondary | ICD-10-CM | POA: Diagnosis not present

## 2022-06-28 DIAGNOSIS — I2510 Atherosclerotic heart disease of native coronary artery without angina pectoris: Secondary | ICD-10-CM | POA: Diagnosis not present

## 2022-06-28 DIAGNOSIS — I255 Ischemic cardiomyopathy: Secondary | ICD-10-CM | POA: Diagnosis not present

## 2022-06-28 DIAGNOSIS — N1831 Chronic kidney disease, stage 3a: Secondary | ICD-10-CM | POA: Diagnosis not present

## 2022-06-28 DIAGNOSIS — I4892 Unspecified atrial flutter: Secondary | ICD-10-CM | POA: Diagnosis not present

## 2022-06-28 DIAGNOSIS — I129 Hypertensive chronic kidney disease with stage 1 through stage 4 chronic kidney disease, or unspecified chronic kidney disease: Secondary | ICD-10-CM | POA: Diagnosis not present

## 2022-07-10 DIAGNOSIS — I129 Hypertensive chronic kidney disease with stage 1 through stage 4 chronic kidney disease, or unspecified chronic kidney disease: Secondary | ICD-10-CM | POA: Diagnosis not present

## 2022-07-10 DIAGNOSIS — S2241XD Multiple fractures of ribs, right side, subsequent encounter for fracture with routine healing: Secondary | ICD-10-CM | POA: Diagnosis not present

## 2022-07-10 DIAGNOSIS — I2510 Atherosclerotic heart disease of native coronary artery without angina pectoris: Secondary | ICD-10-CM | POA: Diagnosis not present

## 2022-07-10 DIAGNOSIS — I255 Ischemic cardiomyopathy: Secondary | ICD-10-CM | POA: Diagnosis not present

## 2022-07-10 DIAGNOSIS — I4819 Other persistent atrial fibrillation: Secondary | ICD-10-CM | POA: Diagnosis not present

## 2022-07-10 DIAGNOSIS — S90931D Unspecified superficial injury of right great toe, subsequent encounter: Secondary | ICD-10-CM | POA: Diagnosis not present

## 2022-07-10 DIAGNOSIS — N1831 Chronic kidney disease, stage 3a: Secondary | ICD-10-CM | POA: Diagnosis not present

## 2022-07-10 DIAGNOSIS — I252 Old myocardial infarction: Secondary | ICD-10-CM | POA: Diagnosis not present

## 2022-07-10 DIAGNOSIS — I4892 Unspecified atrial flutter: Secondary | ICD-10-CM | POA: Diagnosis not present

## 2022-07-13 DIAGNOSIS — M549 Dorsalgia, unspecified: Secondary | ICD-10-CM | POA: Diagnosis not present

## 2022-07-13 DIAGNOSIS — F331 Major depressive disorder, recurrent, moderate: Secondary | ICD-10-CM | POA: Diagnosis not present

## 2022-07-13 DIAGNOSIS — M25512 Pain in left shoulder: Secondary | ICD-10-CM | POA: Diagnosis not present

## 2022-07-13 DIAGNOSIS — I509 Heart failure, unspecified: Secondary | ICD-10-CM | POA: Diagnosis not present

## 2022-07-13 DIAGNOSIS — N1831 Chronic kidney disease, stage 3a: Secondary | ICD-10-CM | POA: Diagnosis not present

## 2022-07-25 ENCOUNTER — Telehealth: Payer: Self-pay | Admitting: Cardiovascular Disease

## 2022-07-25 DIAGNOSIS — I129 Hypertensive chronic kidney disease with stage 1 through stage 4 chronic kidney disease, or unspecified chronic kidney disease: Secondary | ICD-10-CM | POA: Diagnosis not present

## 2022-07-25 DIAGNOSIS — N1831 Chronic kidney disease, stage 3a: Secondary | ICD-10-CM | POA: Diagnosis not present

## 2022-07-25 DIAGNOSIS — I4819 Other persistent atrial fibrillation: Secondary | ICD-10-CM | POA: Diagnosis not present

## 2022-07-25 DIAGNOSIS — S2241XD Multiple fractures of ribs, right side, subsequent encounter for fracture with routine healing: Secondary | ICD-10-CM | POA: Diagnosis not present

## 2022-07-25 DIAGNOSIS — I4892 Unspecified atrial flutter: Secondary | ICD-10-CM | POA: Diagnosis not present

## 2022-07-25 DIAGNOSIS — S90931D Unspecified superficial injury of right great toe, subsequent encounter: Secondary | ICD-10-CM | POA: Diagnosis not present

## 2022-07-25 DIAGNOSIS — I252 Old myocardial infarction: Secondary | ICD-10-CM | POA: Diagnosis not present

## 2022-07-25 DIAGNOSIS — I255 Ischemic cardiomyopathy: Secondary | ICD-10-CM | POA: Diagnosis not present

## 2022-07-25 DIAGNOSIS — I2510 Atherosclerotic heart disease of native coronary artery without angina pectoris: Secondary | ICD-10-CM | POA: Diagnosis not present

## 2022-07-25 NOTE — Telephone Encounter (Signed)
Spoke to April, [facility] RN.   Reports patients weight has increased 4 lbs since Saturday.   Saturday weight was 220 lbs and today was 224 lbs.  She reports he continues to have LE edema R>L, denies SOB or other symptoms.   No increase in sodium.  She reports concern that when they started seeing him beginning of April his weight was 202.  She reports patient saw PCP recently and spironolactone was stopped due to his kidney function (see care everywhere, referred to nephrology as well).   Continues Lasix 40 daily.   She is wondering if they need an updated dry weight or if patient needs to be reassessed.     Echo scheduled 6/3  Will forward to [facility] PA to review.

## 2022-07-25 NOTE — Telephone Encounter (Addendum)
Called patient-follow up scheduled 6/11.   No openings next week (other than Monday AM and patient could not come at that time).   Advised if symptoms change to call office.    RN made aware.

## 2022-07-25 NOTE — Telephone Encounter (Signed)
Weight at last office visit on 06/18/2022 was 215 lbs. I am hesitant to increase his Lasix without seeing him if his PCP stopped his Spironolactone and referred him to Nephrology especially if he is not having any shortness of breath or other CHF symptoms. He was supposed to come in for a repeat BMET in late April/ early May (after last check on 4/22) to recheck his renal function but it does not look like he ever did. I don't see any recent labs in Care Everywhere.  Let's get him a follow-up next week after his with any APP? We can recheck a BMET at that time.   Thanks!

## 2022-07-25 NOTE — Telephone Encounter (Signed)
April with [facility] called to report that pt's weight is now 224.6 and he has gained about 4 lbs in the past week. She would like to know if we have a dry weight on him. Please advise.

## 2022-07-26 NOTE — Telephone Encounter (Signed)
LAB

## 2022-07-27 DIAGNOSIS — I129 Hypertensive chronic kidney disease with stage 1 through stage 4 chronic kidney disease, or unspecified chronic kidney disease: Secondary | ICD-10-CM | POA: Diagnosis not present

## 2022-07-27 DIAGNOSIS — S90931D Unspecified superficial injury of right great toe, subsequent encounter: Secondary | ICD-10-CM | POA: Diagnosis not present

## 2022-07-27 DIAGNOSIS — I4892 Unspecified atrial flutter: Secondary | ICD-10-CM | POA: Diagnosis not present

## 2022-07-27 DIAGNOSIS — I2510 Atherosclerotic heart disease of native coronary artery without angina pectoris: Secondary | ICD-10-CM | POA: Diagnosis not present

## 2022-07-27 DIAGNOSIS — N1831 Chronic kidney disease, stage 3a: Secondary | ICD-10-CM | POA: Diagnosis not present

## 2022-07-27 DIAGNOSIS — I4819 Other persistent atrial fibrillation: Secondary | ICD-10-CM | POA: Diagnosis not present

## 2022-07-27 DIAGNOSIS — I255 Ischemic cardiomyopathy: Secondary | ICD-10-CM | POA: Diagnosis not present

## 2022-07-27 DIAGNOSIS — S2241XD Multiple fractures of ribs, right side, subsequent encounter for fracture with routine healing: Secondary | ICD-10-CM | POA: Diagnosis not present

## 2022-07-27 DIAGNOSIS — I252 Old myocardial infarction: Secondary | ICD-10-CM | POA: Diagnosis not present

## 2022-07-30 ENCOUNTER — Observation Stay (HOSPITAL_COMMUNITY)
Admission: EM | Admit: 2022-07-30 | Discharge: 2022-07-31 | Disposition: A | Discharge status: Home / Self Care | Payer: Medicare HMO | Attending: Internal Medicine | Admitting: Internal Medicine

## 2022-07-30 ENCOUNTER — Encounter (HOSPITAL_COMMUNITY): Payer: Self-pay

## 2022-07-30 ENCOUNTER — Emergency Department (HOSPITAL_BASED_OUTPATIENT_CLINIC_OR_DEPARTMENT_OTHER): Payer: Medicare HMO

## 2022-07-30 ENCOUNTER — Ambulatory Visit (HOSPITAL_COMMUNITY): Payer: Medicare HMO

## 2022-07-30 ENCOUNTER — Other Ambulatory Visit: Payer: Self-pay

## 2022-07-30 DIAGNOSIS — R946 Abnormal results of thyroid function studies: Secondary | ICD-10-CM | POA: Diagnosis not present

## 2022-07-30 DIAGNOSIS — I5022 Chronic systolic (congestive) heart failure: Secondary | ICD-10-CM | POA: Insufficient documentation

## 2022-07-30 DIAGNOSIS — D62 Acute posthemorrhagic anemia: Secondary | ICD-10-CM

## 2022-07-30 DIAGNOSIS — R7303 Prediabetes: Secondary | ICD-10-CM | POA: Diagnosis not present

## 2022-07-30 DIAGNOSIS — I130 Hypertensive heart and chronic kidney disease with heart failure and stage 1 through stage 4 chronic kidney disease, or unspecified chronic kidney disease: Secondary | ICD-10-CM | POA: Insufficient documentation

## 2022-07-30 DIAGNOSIS — Z0000 Encounter for general adult medical examination without abnormal findings: Secondary | ICD-10-CM | POA: Diagnosis not present

## 2022-07-30 DIAGNOSIS — Z7901 Long term (current) use of anticoagulants: Secondary | ICD-10-CM | POA: Insufficient documentation

## 2022-07-30 DIAGNOSIS — I509 Heart failure, unspecified: Secondary | ICD-10-CM | POA: Diagnosis not present

## 2022-07-30 DIAGNOSIS — I4891 Unspecified atrial fibrillation: Secondary | ICD-10-CM | POA: Insufficient documentation

## 2022-07-30 DIAGNOSIS — I2510 Atherosclerotic heart disease of native coronary artery without angina pectoris: Secondary | ICD-10-CM | POA: Diagnosis not present

## 2022-07-30 DIAGNOSIS — M79661 Pain in right lower leg: Secondary | ICD-10-CM | POA: Diagnosis not present

## 2022-07-30 DIAGNOSIS — Z79899 Other long term (current) drug therapy: Secondary | ICD-10-CM | POA: Diagnosis not present

## 2022-07-30 DIAGNOSIS — I700 Atherosclerosis of aorta: Secondary | ICD-10-CM | POA: Diagnosis not present

## 2022-07-30 DIAGNOSIS — Z955 Presence of coronary angioplasty implant and graft: Secondary | ICD-10-CM | POA: Insufficient documentation

## 2022-07-30 DIAGNOSIS — R2240 Localized swelling, mass and lump, unspecified lower limb: Secondary | ICD-10-CM | POA: Diagnosis present

## 2022-07-30 DIAGNOSIS — Z87891 Personal history of nicotine dependence: Secondary | ICD-10-CM | POA: Diagnosis not present

## 2022-07-30 DIAGNOSIS — N1831 Chronic kidney disease, stage 3a: Secondary | ICD-10-CM | POA: Diagnosis not present

## 2022-07-30 DIAGNOSIS — F331 Major depressive disorder, recurrent, moderate: Secondary | ICD-10-CM | POA: Diagnosis not present

## 2022-07-30 DIAGNOSIS — J479 Bronchiectasis, uncomplicated: Secondary | ICD-10-CM | POA: Diagnosis not present

## 2022-07-30 DIAGNOSIS — J869 Pyothorax without fistula: Secondary | ICD-10-CM | POA: Diagnosis not present

## 2022-07-30 DIAGNOSIS — D649 Anemia, unspecified: Secondary | ICD-10-CM

## 2022-07-30 DIAGNOSIS — Z125 Encounter for screening for malignant neoplasm of prostate: Secondary | ICD-10-CM | POA: Diagnosis not present

## 2022-07-30 DIAGNOSIS — E538 Deficiency of other specified B group vitamins: Secondary | ICD-10-CM | POA: Diagnosis not present

## 2022-07-30 HISTORY — DX: Acute posthemorrhagic anemia: D62

## 2022-07-30 LAB — CBC
HCT: 22.1 % — ABNORMAL LOW (ref 39.0–52.0)
Hemoglobin: 6.7 g/dL — CL (ref 13.0–17.0)
MCH: 26 pg (ref 26.0–34.0)
MCHC: 30.3 g/dL (ref 30.0–36.0)
MCV: 85.7 fL (ref 80.0–100.0)
Platelets: 172 10*3/uL (ref 150–400)
RBC: 2.58 MIL/uL — ABNORMAL LOW (ref 4.22–5.81)
RDW: 14.6 % (ref 11.5–15.5)
WBC: 9.4 10*3/uL (ref 4.0–10.5)
nRBC: 0 % (ref 0.0–0.2)

## 2022-07-30 LAB — TYPE AND SCREEN: Unit division: 0

## 2022-07-30 LAB — BASIC METABOLIC PANEL
Anion gap: 9 (ref 5–15)
BUN: 16 mg/dL (ref 8–23)
CO2: 28 mmol/L (ref 22–32)
Calcium: 8.3 mg/dL — ABNORMAL LOW (ref 8.9–10.3)
Chloride: 103 mmol/L (ref 98–111)
Creatinine, Ser: 1.67 mg/dL — ABNORMAL HIGH (ref 0.61–1.24)
GFR, Estimated: 44 mL/min — ABNORMAL LOW (ref >60)
Glucose, Bld: 120 mg/dL — ABNORMAL HIGH (ref 70–99)
Potassium: 3.8 mmol/L (ref 3.5–5.1)
Sodium: 140 mmol/L (ref 135–145)

## 2022-07-30 LAB — PREPARE RBC (CROSSMATCH)

## 2022-07-30 LAB — BPAM RBC: Unit Type and Rh: 5100

## 2022-07-30 LAB — BRAIN NATRIURETIC PEPTIDE: B Natriuretic Peptide: 95.5 pg/mL (ref 0.0–100.0)

## 2022-07-30 MED ORDER — EMPAGLIFLOZIN 10 MG PO TABS
10.0000 mg | ORAL_TABLET | Freq: Every day | ORAL | Status: DC
  Administered 2022-07-31: 10 mg via ORAL
  Filled 2022-07-30: qty 1

## 2022-07-30 MED ORDER — FLUTICASONE PROPIONATE 50 MCG/ACT NA SUSP: 1.0000 | Freq: Every day | NASAL | Status: DC | PRN

## 2022-07-30 MED ORDER — QUETIAPINE FUMARATE 50 MG PO TABS
25.0000 mg | ORAL_TABLET | Freq: Every day | ORAL | Status: DC
  Administered 2022-07-30: 25 mg via ORAL
  Filled 2022-07-30: qty 1

## 2022-07-30 MED ORDER — AMIODARONE HCL 200 MG PO TABS
200.0000 mg | ORAL_TABLET | Freq: Every day | ORAL | Status: DC
  Administered 2022-07-31: 200 mg via ORAL
  Filled 2022-07-30: qty 1

## 2022-07-30 MED ORDER — BUPROPION HCL ER (XL) 150 MG PO TB24
150.0000 mg | ORAL_TABLET | Freq: Every day | ORAL | Status: DC
  Administered 2022-07-31: 150 mg via ORAL
  Filled 2022-07-30: qty 1

## 2022-07-30 MED ORDER — PREGABALIN 75 MG PO CAPS
150.0000 mg | ORAL_CAPSULE | Freq: Three times a day (TID) | ORAL | Status: DC
  Administered 2022-07-30 – 2022-07-31 (×3): 150 mg via ORAL
  Filled 2022-07-30 (×3): qty 2

## 2022-07-30 MED ORDER — FUROSEMIDE 10 MG/ML IJ SOLN
20.0000 mg | Freq: Once | INTRAMUSCULAR | Status: AC
  Administered 2022-07-30: 20 mg via INTRAVENOUS
  Filled 2022-07-30: qty 2

## 2022-07-30 MED ORDER — SPIRONOLACTONE 12.5 MG HALF TABLET
12.5000 mg | ORAL_TABLET | Freq: Every day | ORAL | Status: DC
  Administered 2022-07-31: 12.5 mg via ORAL
  Filled 2022-07-30: qty 1

## 2022-07-30 MED ORDER — PANTOPRAZOLE SODIUM 40 MG PO TBEC
40.0000 mg | DELAYED_RELEASE_TABLET | Freq: Every day | ORAL | Status: DC
  Administered 2022-07-31: 40 mg via ORAL
  Filled 2022-07-30: qty 1

## 2022-07-30 MED ORDER — ACETAMINOPHEN 650 MG RE SUPP: 650.0000 mg | Freq: Four times a day (QID) | RECTAL | Status: DC | PRN

## 2022-07-30 MED ORDER — ACETAMINOPHEN 325 MG PO TABS: 650.0000 mg | ORAL_TABLET | Freq: Four times a day (QID) | ORAL | Status: DC | PRN

## 2022-07-30 MED ORDER — ROSUVASTATIN CALCIUM 20 MG PO TABS
20.0000 mg | ORAL_TABLET | Freq: Every day | ORAL | Status: DC
  Administered 2022-07-31: 20 mg via ORAL
  Filled 2022-07-30: qty 1

## 2022-07-30 MED ORDER — SODIUM CHLORIDE 0.9% IV SOLUTION: Freq: Once | INTRAVENOUS | Status: DC

## 2022-07-30 MED ORDER — FUROSEMIDE 40 MG PO TABS
40.0000 mg | ORAL_TABLET | Freq: Every day | ORAL | Status: DC
  Administered 2022-07-31: 40 mg via ORAL
  Filled 2022-07-30: qty 1

## 2022-07-30 MED ORDER — SACUBITRIL-VALSARTAN 24-26 MG PO TABS
1.0000 | ORAL_TABLET | Freq: Two times a day (BID) | ORAL | Status: DC
  Administered 2022-07-30 – 2022-07-31 (×2): 1 via ORAL
  Filled 2022-07-30 (×3): qty 1

## 2022-07-30 MED ORDER — PAROXETINE HCL 30 MG PO TABS
30.0000 mg | ORAL_TABLET | Freq: Every day | ORAL | Status: DC
  Administered 2022-07-31: 30 mg via ORAL
  Filled 2022-07-30: qty 1

## 2022-07-30 NOTE — ED Provider Notes (Incomplete)
Patient's care resumed at shift change from [facility].  For full HPI, please refer to previous providers note.  Plan is to admit due to symptomatic anemia. Physical Exam  BP (!) 175/67 (BP Location: Right Arm)   Pulse (!) 55   Temp 98 F (36.7 C) (Oral)   Resp 20   Ht 5\' 10"  (1.778 m)   Wt 101.6 kg   SpO2 93%   BMI 32.14 kg/m   Physical Exam Vitals and nursing note reviewed.  Constitutional:      Appearance: Normal appearance.  HENT:     Head: Normocephalic and atraumatic.     Mouth/Throat:     Mouth: Mucous membranes are moist.  Eyes:     General: No scleral icterus. Cardiovascular:     Rate and Rhythm: Normal rate and regular rhythm.     Pulses: Normal pulses.     Heart sounds: Normal heart sounds.  Pulmonary:     Effort: Pulmonary effort is normal.     Breath sounds: Normal breath sounds.  Abdominal:     General: Abdomen is flat.     Palpations: Abdomen is soft.     Tenderness: There is no abdominal tenderness.  Musculoskeletal:        General: No deformity.     Comments: 1-2+ pitting edema noted to right lower extremity.  No pitting edema appreciated to left lower extremity.  No overlying skin changes or palpable deformity noted.  Skin:    General: Skin is warm.     Findings: No rash.  Neurological:     General: No focal deficit present.     Mental Status: He is alert.  Psychiatric:        Mood and Affect: Mood normal.     Procedures  Procedures  ED Course / MDM   Clinical Course as of 07/30/22 1541  Mon Jul 30, 2022  1401 Hemoglobin(!!): 6.7 [SB]  1405 Discussed with patient and family regarding low hgb and plans for admission. Pt and family agreeable to admission at this time.  [SB]    Clinical Course User Index [SB] Blue, Soijett A, PA-C   Medical Decision Making Amount and/or Complexity of Data Reviewed Labs: ordered. Decision-making details documented in ED Course.  Risk Prescription drug management. Decision regarding  hospitalization.   72 year old male presents with a chief complaint of abnormal labs.  CBC significant for hemoglobin of 6.7.  She is currently on Xarelto for A-fib.  On exam he has 1-2+ pitting edema noted to the right lower extremity.  CMP with no acute electrolyte abnormalities or AKI.  Rectal exam done with RN present throughout examination, no hemorrhoids noted.  Rectal Hemoccult positive.  GI bleeding is most likely the culprit of patient's low hemoglobin.  Patient does not have point.  I sent a secure chat to gastroenterology.  Patient will require admission for anemia requiring transfusion.  Imaging including ultrasound of the lower extremities show no DVT.  BNP is normal unlikely CHF.  Consultations obtained: I requested consultation with Dr. Rhona Leavens [facility] hospitalist, and discussed lab and imaging findings as well as pertinent plan.  He/she agreed to admit the patient.  Disposition Admit. This chart was dictated using voice recognition software.  Despite best efforts to proofread,  errors can occur which can change the documentation meaning.         Jeanelle Malling, PA 07/31/22 1102    Jeanelle Malling, [place] 07/31/22 1103    Gloris Manchester, MD 08/01/22 [phone]

## 2022-07-30 NOTE — ED Notes (Signed)
ED TO INPATIENT HANDOFF REPORT  ED Nurse Name and Phone #: Clista Bernhardt Name/Age/Gender Brendan Butler 72 y.o. male Room/Bed: H018C/H018C  Code Status   Code Status: Full Code  Home/SNF/Other Home Patient oriented to: self, place, time, and situation Is this baseline? Yes   Triage Complete: Triage complete  Chief Complaint Acute blood loss anemia [D62]  Triage Note Pt sent here by doctor due to abnormal labs (pt unsure which labs). Pt had recent increase in Lasix due to right leg swelling. Pt denies any significant change in swelling since increase but denies any sob or cp. Pt a.o, nad noted   Allergies Allergies  Allergen Reactions   Levitra [Vardenafil] Other (See Comments)    Blurred vision   Aspirin Hives   Lipitor [Atorvastatin] Other (See Comments)    Muscle cramps; patient states he does not take medication at home    Level of Care/Admitting Diagnosis ED Disposition     ED Disposition  Admit   Condition  --   Comment  Hospital Area: [facility] [[id number]]  Level of Care: Med-Surg [16]  May place patient in observation at [facility] or [facility] if equivalent level of care is available:: No  Covid Evaluation: Asymptomatic - no recent exposure (last 10 days) testing not required  Diagnosis: Acute blood loss anemia [161096]  Admitting Physician: Jerald Kief [[id number]]  Attending Physician: Jerald Kief [[id number]]          B Medical/Surgery History Past Medical History:  Diagnosis Date   Acute myocardial infarction of other anterior wall, initial episode of care 12/29/2012   Promus stent to LAD   Acute ST segment elevation MI (HCC) 01/05/2013   secondary to thrombus in stent; Brilintta changed to Effient pt with ASA allergy   Coronary artery disease 2001   stent to LAD and patent 2007 on cath   Echocardiogram abnormal 01/06/2013   EF 45-50%, grade I diastolic dysfunction   Erectile dysfunction    Hyperlipidemia LDL goal  <70    Hypertension    OSA (obstructive sleep apnea)    No CPAP use overnight   Past Surgical History:  Procedure Laterality Date   BACK SURGERY  2007   Decompression lumbar laminectomy and micro discectomy, L4-5   BRONCHIAL BIOPSY  12/28/2021   Procedure: BRONCHIAL BIOPSIES;  Surgeon: Leslye Peer, MD;  Location: [facility] ENDOSCOPY;  Service: Pulmonary;;   BRONCHIAL BRUSHINGS  12/28/2021   Procedure: BRONCHIAL BRUSHINGS;  Surgeon: Leslye Peer, MD;  Location: [facility] ENDOSCOPY;  Service: Pulmonary;;   BRONCHIAL NEEDLE ASPIRATION BIOPSY  12/28/2021   Procedure: BRONCHIAL NEEDLE ASPIRATION BIOPSIES;  Surgeon: Leslye Peer, MD;  Location: [facility] ENDOSCOPY;  Service: Pulmonary;;   BRONCHIAL WASHINGS  12/28/2021   Procedure: BRONCHIAL WASHINGS;  Surgeon: Leslye Peer, MD;  Location: [facility] ENDOSCOPY;  Service: Pulmonary;;   CARDIAC CATHETERIZATION  2007   patent stent to LAD and normal Cors   CARDIOVERSION N/A 05/10/2022   Procedure: CARDIOVERSION;  Surgeon: Sande Rives, MD;  Location: [facility] ENDOSCOPY;  Service: Cardiovascular;  Laterality: N/A;   CARDIOVERSION N/A 05/28/2022   Procedure: CARDIOVERSION;  Surgeon: Wendall Stade, MD;  Location: [facility] ENDOSCOPY;  Service: Cardiovascular;  Laterality: N/A;   CHOLECYSTECTOMY N/A 12/26/2021   Procedure: LAPAROSCOPIC CHOLECYSTECTOMY, LAPAROSCOPIC LYSIS OF ADHESIONS GREATER THAN 1 HOUR;  Surgeon: [name], MD;  Location: [facility] OR;  Service: General;  Laterality: N/A;   CORONARY ANGIOPLASTY WITH STENT PLACEMENT  2001  to LAD   CORONARY ANGIOPLASTY WITH STENT PLACEMENT  12/29/12   STEMI with promus DES to LAD   CORONARY ANGIOPLASTY WITH STENT PLACEMENT  01/05/13   STEMI with thrombosis in stent to LAD   FIDUCIAL MARKER PLACEMENT  12/28/2021   Procedure: FIDUCIAL MARKER PLACEMENT;  Surgeon: Leslye Peer, MD;  Location: [facility] ENDOSCOPY;  Service: Pulmonary;;   LEFT HEART CATH N/A 12/29/2012   Procedure: LEFT HEART CATH;  Surgeon: Corky Crafts, MD;   Location: [facility] CATH LAB;  Service: Cardiovascular;  Laterality: N/A;   LEFT HEART CATHETERIZATION WITH CORONARY ANGIOGRAM N/A 01/05/2013   Procedure: LEFT HEART CATHETERIZATION WITH CORONARY ANGIOGRAM;  Surgeon: Micheline Chapman, MD;  Location: [facility] CATH LAB;  Service: Cardiovascular;  Laterality: N/A;   LEFT HEART CATHETERIZATION WITH CORONARY ANGIOGRAM N/A 04/22/2013   Procedure: LEFT HEART CATHETERIZATION WITH CORONARY ANGIOGRAM;  Surgeon: Lennette Bihari, MD;  Location: [facility] CATH LAB;  Service: Cardiovascular;  Laterality: N/A;   PARTIAL NEPHRECTOMY Left 1975   PATIENT ONLY HAS ONE KIDNEY   PERCUTANEOUS CORONARY STENT INTERVENTION (PCI-S)  12/29/2012   Procedure: PERCUTANEOUS CORONARY STENT INTERVENTION (PCI-S);  Surgeon: Corky Crafts, MD;  Location: [facility] CATH LAB;  Service: Cardiovascular;;   TEE WITHOUT CARDIOVERSION N/A 05/10/2022   Procedure: TRANSESOPHAGEAL ECHOCARDIOGRAM (TEE);  Surgeon: Sande Rives, MD;  Location: [facility] ENDOSCOPY;  Service: Cardiovascular;  Laterality: N/A;   TEE WITHOUT CARDIOVERSION N/A 05/28/2022   Procedure: TRANSESOPHAGEAL ECHOCARDIOGRAM (TEE);  Surgeon: Wendall Stade, MD;  Location: [facility] ENDOSCOPY;  Service: Cardiovascular;  Laterality: N/A;     A IV Location/Drains/Wounds Patient Lines/Drains/Airways Status     Active Line/Drains/Airways     Name Placement date Placement time Site Days   Peripheral IV 07/30/22 20 G Anterior;Left Forearm 07/30/22  1426  Forearm  less than 1   Wound / Incision (Open or Dehisced) 05/26/22 Laceration Toe (Comment  which one) Right Bottom of R great toe where it flexes and meets ground has split open 05/26/22  1800  Toe (Comment  which one)  65            Intake/Output Last 24 hours No intake or output data in the 24 hours ending 07/30/22 1820  Labs/Imaging Results for orders placed or performed during the hospital encounter of 07/30/22 (from the past 48 hour(s))  Basic metabolic panel     Status: Abnormal    Collection Time: 07/30/22  1:00 PM  Result Value Ref Range   Sodium 140 135 - 145 mmol/L   Potassium 3.8 3.5 - 5.1 mmol/L   Chloride 103 98 - 111 mmol/L   CO2 28 22 - 32 mmol/L   Glucose, Bld 120 (H) 70 - 99 mg/dL    Comment: Glucose reference range applies only to samples taken after fasting for at least 8 hours.   BUN 16 8 - 23 mg/dL   Creatinine, Ser 8.11 (H) 0.61 - 1.24 mg/dL   Calcium 8.3 (L) 8.9 - 10.3 mg/dL   GFR, Estimated 44 (L) >60 mL/min    Comment: (NOTE) Calculated using the CKD-EPI Creatinine Equation (2021)    Anion gap 9 5 - 15    Comment: Performed at [facility], [address], [place], [place] [address]  CBC     Status: Abnormal   Collection Time: 07/30/22  1:00 PM  Result Value Ref Range   WBC 9.4 4.0 - 10.5 K/uL   RBC 2.58 (L) 4.22 - 5.81 MIL/uL   Hemoglobin 6.7 (  LL) 13.0 - 17.0 g/dL    Comment: REPEATED TO VERIFY THIS CRITICAL RESULT HAS VERIFIED AND BEEN CALLED TO B. Olivianna Higley, RN BY SHAY EKDAHL ON 06 03 2024 AT 1356, AND HAS BEEN READ BACK.     HCT 22.1 (L) 39.0 - 52.0 %   MCV 85.7 80.0 - 100.0 fL   MCH 26.0 26.0 - 34.0 pg   MCHC 30.3 30.0 - 36.0 g/dL   RDW 16.1 09.6 - 04.5 %   Platelets 172 150 - 400 K/uL    Comment: REPEATED TO VERIFY   nRBC 0.0 0.0 - 0.2 %    Comment: Performed at [facility], [address] [place], [place] [address]  Prepare RBC (crossmatch)     Status: None   Collection Time: 07/30/22  2:10 PM  Result Value Ref Range   Order Confirmation      ORDER PROCESSED BY BLOOD BANK Performed at [facility], [address] [place], [place] [address]   Type and screen [facility]     Status: None (Preliminary result)   Collection Time: 07/30/22  2:23 PM  Result Value Ref Range   ABO/RH(D) O POS    Antibody Screen NEG    Sample Expiration 08/02/2022,2359    Unit Number [id number]    Blood Component Type RED CELLS,LR    Unit division 00    Status of Unit ISSUED    Transfusion Status OK TO  TRANSFUSE    Crossmatch Result      Compatible Performed at [facility], [address] [place], [place] [address]    VAS [place] LOWER EXTREMITY VENOUS (DVT) (7a-7p)  Result Date: 07/30/2022  Lower Venous DVT Study Patient Name:  HOAN ROUGH  Date of Exam:   07/30/2022 Medical Rec #: [id number]         Accession #:    [id number] Date of Birth: [birth year]        Patient Gender: M Patient Age:   29 years Exam Location:  [facility] Procedure:      VAS [place] LOWER EXTREMITY VENOUS (DVT) Referring Phys: Velda Shell BLUE --------------------------------------------------------------------------------  Indications: RLE swelling after medication changes.  Comparison Study: Previous 01/27/16 negative. Performing Technologist: McKayla Maag RVT, [place]  Examination Guidelines: A complete evaluation includes B-mode imaging, spectral Doppler, color Doppler, and power Doppler as needed of all accessible portions of each vessel. Bilateral testing is considered an integral part of a complete examination. Limited examinations for reoccurring indications may be performed as noted. The reflux portion of the exam is performed with the patient in reverse Trendelenburg.  +---------+---------------+---------+-----------+----------+--------------+ RIGHT    CompressibilityPhasicitySpontaneityPropertiesThrombus Aging +---------+---------------+---------+-----------+----------+--------------+ CFV      Full           Yes      Yes                                 +---------+---------------+---------+-----------+----------+--------------+ SFJ      Full                                                        +---------+---------------+---------+-----------+----------+--------------+ FV Prox  Full                                                        +---------+---------------+---------+-----------+----------+--------------+  FV Mid   Full                                                         +---------+---------------+---------+-----------+----------+--------------+ FV DistalFull                                                        +---------+---------------+---------+-----------+----------+--------------+ PFV      Full                                                        +---------+---------------+---------+-----------+----------+--------------+ POP      Full           Yes      Yes                                 +---------+---------------+---------+-----------+----------+--------------+ PTV      Full                                                        +---------+---------------+---------+-----------+----------+--------------+ PERO     Full                                                        +---------+---------------+---------+-----------+----------+--------------+   +----+---------------+---------+-----------+----------+--------------+ LEFTCompressibilityPhasicitySpontaneityPropertiesThrombus Aging +----+---------------+---------+-----------+----------+--------------+ CFV Full           Yes      Yes                                 +----+---------------+---------+-----------+----------+--------------+ SFJ Full                                                        +----+---------------+---------+-----------+----------+--------------+     Summary: RIGHT: - There is no evidence of deep vein thrombosis in the lower extremity.  - No cystic structure found in the popliteal fossa.  LEFT: - No evidence of common femoral vein obstruction.  *See table(s) above for measurements and observations. Electronically signed by Lemar Livings MD on 07/30/2022 at 4:39:10 PM.    Final     Pending Labs Unresulted Labs (From admission, onward)     Start     Ordered   07/31/22 0500  Comprehensive metabolic panel  Tomorrow morning,   R        07/30/22 1808   07/31/22 0500  CBC  Tomorrow morning,   R  07/30/22 1808   07/30/22 1409  Brain natriuretic  peptide  Add-on,   AD        07/30/22 1411            Vitals/Pain Today's Vitals   07/30/22 1248 07/30/22 1611 07/30/22 1627 07/30/22 1800  BP:  (!) 154/56 (!) 142/53 (!) 148/55  Pulse:  (!) 53 (!) 51 (!) 54  Resp:  15 16 15   Temp:  98.1 F (36.7 C) 98.1 F (36.7 C) 98.2 F (36.8 C)  TempSrc:   Oral Oral  SpO2:  99% 100% 98%  Weight: 101.6 kg     Height: 5\' 10"  (1.778 m)     PainSc: 0-No pain       Isolation Precautions No active isolations  Medications Medications  0.9 %  sodium chloride infusion (Manually program via Guardrails IV Fluids) ( Intravenous Not Given 07/30/22 1724)  acetaminophen (TYLENOL) tablet 650 mg (has no administration in time range)    Or  acetaminophen (TYLENOL) suppository 650 mg (has no administration in time range)    Mobility walks     Focused Assessments Pulmonary Assessment Handoff:  Lung sounds: Bilateral Breath Sounds: Diminished O2 Device: Room Air      R Recommendations: See Admitting Provider Note  Report given to:   Additional Notes:

## 2022-07-30 NOTE — ED Triage Notes (Signed)
Pt sent here by doctor due to abnormal labs (pt unsure which labs). Pt had recent increase in Lasix due to right leg swelling. Pt denies any significant change in swelling since increase but denies any sob or cp. Pt a.o, nad noted

## 2022-07-30 NOTE — ED Provider Notes (Signed)
[facility]GENCY DEPARTMENT AT [facility] Provider Note   CSN: [id number] Arrival date & time: 07/30/22  1234     History  Chief Complaint  Patient presents with   Leg Swelling    Brendan Butler is a 72 y.o. male with a past medical history of CKD, A-fib, CHF who presents emergency department with concerns for abnormal labs.  Patient also voices concern for right lower extremity swelling.  Notes he had an increase in his Lasix due to right lower extremity swelling.  Also voices concerns for shortness of breath, dizziness, fatigue.  Daughter is at bedside and notes that patient was taken off of spironolactone however was placed back department today after his visit with his primary care provider.  Denies blood in stool, coughing up blood, hematemesis, hematuria. Denies urinary symptoms. He also notes that he fell prior to the onset of his symptoms. Denies hitting his head or LOC.  Patient denies history of PE/DVT.  Patient is anticoagulated on Xarelto for A-fib.    Per pt chart review: Pt was evaluated at his PCP office today with [facility] and was referred to the ED due to concern for abnormal labs. Creatinine worse, patient with one kidney recent creatinine at 1.8    The history is provided by the patient and a relative. No language interpreter was used.       Home Medications Prior to Admission medications   Medication Sig Start Date End Date Taking? Authorizing Provider  amiodarone (PACERONE) 200 MG tablet Take 1 tablet (200 mg total) by mouth daily. 06/18/22   Corrin Parker, PA-C  buPROPion (WELLBUTRIN XL) 150 MG 24 hr tablet Take 1 tablet (150 mg total) by mouth daily. 05/24/22   Elgergawy, Leana Roe, MD  Cyanocobalamin (VITAMIN B-12 IJ) Inject as directed every 30 (thirty) days.    [provider]  empagliflozin (JARDIANCE) 10 MG TABS tablet Take 1 tablet (10 mg total) by mouth daily. 06/18/22   Marjie Skiff E, PA-C  fluticasone (FLONASE) 50 MCG/ACT  nasal spray Place 1 spray into both nostrils daily. 12/24/21   [provider]  furosemide (LASIX) 40 MG tablet Take 1 tablet (40 mg total) by mouth daily. 06/18/22   Marjie Skiff E, PA-C  pantoprazole (PROTONIX) 40 MG tablet Take 1 tablet (40 mg total) by mouth daily. 05/11/22   Pokhrel, Rebekah Chesterfield, MD  PARoxetine (PAXIL) 30 MG tablet Take 30 mg by mouth daily.    [provider]  potassium chloride (KLOR-CON M) 10 MEQ tablet Take 1 tablet (10 mEq total) by mouth daily as needed (take wtih lasix only). 06/14/22   Fenton, Clint R, PA  pregabalin (LYRICA) 150 MG capsule Take 1 capsule (150 mg total) by mouth 2 (two) times daily. 05/24/22   Elgergawy, Leana Roe, MD  QUEtiapine (SEROQUEL) 25 MG tablet Take 1 tablet (25 mg total) by mouth at bedtime. 05/10/22   Pokhrel, Rebekah Chesterfield, MD  rivaroxaban (XARELTO) 20 MG TABS tablet Take 1 tablet (20 mg total) by mouth daily with supper. 06/18/22   Marjie Skiff E, PA-C  rosuvastatin (CRESTOR) 20 MG tablet Take 1 tablet (20 mg total) by mouth daily. 06/18/22   Marjie Skiff E, PA-C  sacubitril-valsartan (ENTRESTO) 24-26 MG Take 1 tablet by mouth 2 (two) times daily. 06/18/22   Corrin Parker, PA-C  spironolactone (ALDACTONE) 25 MG tablet Take 1/2 tablet (12.5 mg total) by mouth daily. 06/18/22   Corrin Parker, PA-C      Allergies  Levitra [vardenafil], Aspirin, and Lipitor [atorvastatin]    Review of Systems   Review of Systems  All other systems reviewed and are negative.   Physical Exam Updated Vital Signs BP (!) 175/67 (BP Location: Right Arm)   Pulse (!) 55   Temp 98 F (36.7 C) (Oral)   Resp 20   Ht 5\' 10"  (1.778 m)   Wt 101.6 kg   SpO2 93%   BMI 32.14 kg/m  Physical Exam Vitals and nursing note reviewed.  Constitutional:      General: He is not in acute distress.    Appearance: He is not diaphoretic.  HENT:     Head: Normocephalic and atraumatic.     Mouth/Throat:     Pharynx: No oropharyngeal exudate.  Eyes:      General: No scleral icterus.    Conjunctiva/sclera: Conjunctivae normal.  Cardiovascular:     Rate and Rhythm: Normal rate and regular rhythm.     Pulses: Normal pulses.     Heart sounds: Normal heart sounds.  Pulmonary:     Effort: Pulmonary effort is normal. No respiratory distress.     Breath sounds: Normal breath sounds. No wheezing.  Abdominal:     General: Bowel sounds are normal.     Palpations: Abdomen is soft. There is no mass.     Tenderness: There is no abdominal tenderness. There is no guarding or rebound.  Musculoskeletal:        General: Normal range of motion.     Cervical back: Normal range of motion and neck supple.     Comments: 1-2+ pitting edema noted to right lower extremity.  No pitting edema appreciated to left lower extremity.  No overlying skin changes or palpable deformity noted.  Skin:    General: Skin is warm and dry.  Neurological:     Mental Status: He is alert.  Psychiatric:        Behavior: Behavior normal.     ED Results / Procedures / Treatments   Labs (all labs ordered are listed, but only abnormal results are displayed) Labs Reviewed  BASIC METABOLIC PANEL - Abnormal; Notable for the following components:      Result Value   Glucose, Bld 120 (*)    Creatinine, Ser 1.67 (*)    Calcium 8.3 (*)    GFR, Estimated 44 (*)    All other components within normal limits  CBC - Abnormal; Notable for the following components:   RBC 2.58 (*)    Hemoglobin 6.7 (*)    HCT 22.1 (*)    All other components within normal limits  BRAIN NATRIURETIC PEPTIDE  TYPE AND SCREEN  PREPARE RBC (CROSSMATCH)    EKG None  Radiology No results found.  Procedures Procedures    Medications Ordered in ED Medications  0.9 %  sodium chloride infusion (Manually program via Guardrails IV Fluids) (has no administration in time range)    ED Course/ Medical Decision Making/ A&P Clinical Course as of 07/30/22 1500  Mon Jul 30, 2022  1401 Hemoglobin(!!):  6.7 [SB]  1405 Discussed with patient and family regarding low hgb and plans for admission. Pt and family agreeable to admission at this time.  [SB]    Clinical Course User Index [SB] Khalil Belote A, PA-C                             Medical Decision Making Amount and/or Complexity of Data Reviewed Labs:  ordered. Decision-making details documented in ED Course.  Risk Prescription drug management.   Pt presents with concerns for abnormal labs onset today.  Patient also notes concerns for right leg swelling, shortness of breath, dizziness.  Patient is currently on Xarelto for A-fib.  Patient afebrile.  On exam patient with 1-2+ pitting edema noted to right lower extremity.  No pitting edema appreciated to left lower extremity.  No overlying skin changes or palpable deformity noted.  No acute cardiovascular, respiratory exam findings.  Differential diagnosis includes DVT, CHF exacerbation, anemia, electrolyte abnormality, hypoglycemia.   Co morbidities that complicate the patient evaluation: HTN, CAD, HLD  Additional history obtained:  Additional history obtained from Daughter/Son External records from outside source obtained and reviewed including: Patient was evaluated by his primary care provider today and told to come to the emergency department due to abnormal lab findings.  Labs:  I ordered, and personally interpreted labs.  The pertinent results include:   BMP with slightly elevated creatinine at 1.67, GFR slightly decreased to 44 otherwise unremarkable CBC with slightly decreased hemoglobin of 6.7 otherwise unremarkable.  Imaging: I ordered imaging studies including RLE DVT US study ordered with results pending at time of sign out.   Medications:  I ordered medication including pRBC transfusion for anemia treatment I have reviewed the patients home medicines and have made adjustments as needed  Patient case discussed with Jeanelle Malling, PA-C at sign-out. Plan at sign-out is  pending imaging, likely Admission to the hospital, however, plans may change as per oncoming team. Patient care transferred at sign out.   This chart was dictated using voice recognition software, Dragon. Despite the best efforts of this provider to proofread and correct errors, errors may still occur which can change documentation meaning.   Final Clinical Impression(s) / ED Diagnoses Final diagnoses:  Symptomatic anemia    Rx / DC Orders ED Discharge Orders     None         Lavell Ridings A, PA-C 07/30/22 1517    Wynetta Fines, MD 07/30/22 [phone]

## 2022-07-30 NOTE — Progress Notes (Signed)
Right lower extremity venous study completed.   Preliminary results relayed to MD.  Please see CV Procedures for preliminary results.  Kylynn Street, RVT  4:11 PM 07/30/22

## 2022-07-30 NOTE — H&P (Signed)
History and Physical    Patient: Brendan Butler:[id number] DOB: [birth year] DOA: 07/30/2022 DOS: the patient was seen and examined on 07/30/2022 PCP: [name], MD  Patient coming from: Home  Chief Complaint:  Chief Complaint  Patient presents with   Leg Swelling   HPI: Brendan Butler is a 72 y.o. male with medical history significant of CAD, HLD, OSA not on CPAP, HTN, afib on xarelto who presents to ED with concerns of "abmormal" lab value. Pt was recently discharged on 05/29/22 for afib RVR and falls resulting in rib fractures, ultimately being discharged on continued xarelto. Pt reports being in his usual state of health with rib rx symptoms resolving over time. Denies chest pains, sob, abd pain or nausea. Pt had been tolerating diet well. Denies BRBPR or melena  Chart reviewed. HHRN noted increase in wt prior to admit of with concerns of vol overload. Prior to increasing lasix dose, pt was scheduled for PCP f/u on 6/11 with lab drawn prior. Outpt labs were noted to be "abnormal" and pt was referred to the ED for further work up.  In the ED, pt was found to have Cr 1.67 with hgb 6.7 (was 12.6 on 3/30). LE doppler was done and was neg for DVT. Pt was found to be heme pos. GI was consulted by EDP and hospitalist consulted for consideration for admission  Review of Systems: As mentioned in the history of present illness. All other systems reviewed and are negative.  Past Medical History:  Diagnosis Date   Acute myocardial infarction of other anterior wall, initial episode of care 12/29/2012   Promus stent to LAD   Acute ST segment elevation MI (HCC) 01/05/2013   secondary to thrombus in stent; Brilintta changed to Effient pt with ASA allergy   Coronary artery disease 2001   stent to LAD and patent 2007 on cath   Echocardiogram abnormal 01/06/2013   EF 45-50%, grade I diastolic dysfunction   Erectile dysfunction    Hyperlipidemia LDL goal <70    Hypertension    OSA  (obstructive sleep apnea)    No CPAP use overnight   Past Surgical History:  Procedure Laterality Date   BACK SURGERY  2007   Decompression lumbar laminectomy and micro discectomy, L4-5   BRONCHIAL BIOPSY  12/28/2021   Procedure: BRONCHIAL BIOPSIES;  Surgeon: Leslye Peer, MD;  Location: [facility] ENDOSCOPY;  Service: Pulmonary;;   BRONCHIAL BRUSHINGS  12/28/2021   Procedure: BRONCHIAL BRUSHINGS;  Surgeon: Leslye Peer, MD;  Location: [facility] ENDOSCOPY;  Service: Pulmonary;;   BRONCHIAL NEEDLE ASPIRATION BIOPSY  12/28/2021   Procedure: BRONCHIAL NEEDLE ASPIRATION BIOPSIES;  Surgeon: Leslye Peer, MD;  Location: [facility] ENDOSCOPY;  Service: Pulmonary;;   BRONCHIAL WASHINGS  12/28/2021   Procedure: BRONCHIAL WASHINGS;  Surgeon: Leslye Peer, MD;  Location: [facility] ENDOSCOPY;  Service: Pulmonary;;   CARDIAC CATHETERIZATION  2007   patent stent to LAD and normal Cors   CARDIOVERSION N/A 05/10/2022   Procedure: CARDIOVERSION;  Surgeon: Sande Rives, MD;  Location: [facility] ENDOSCOPY;  Service: Cardiovascular;  Laterality: N/A;   CARDIOVERSION N/A 05/28/2022   Procedure: CARDIOVERSION;  Surgeon: Wendall Stade, MD;  Location: [facility] ENDOSCOPY;  Service: Cardiovascular;  Laterality: N/A;   CHOLECYSTECTOMY N/A 12/26/2021   Procedure: LAPAROSCOPIC CHOLECYSTECTOMY, LAPAROSCOPIC LYSIS OF ADHESIONS GREATER THAN 1 HOUR;  Surgeon: [name], MD;  Location: [facility] OR;  Service: General;  Laterality: N/A;   CORONARY ANGIOPLASTY WITH STENT PLACEMENT  2001   to LAD  CORONARY ANGIOPLASTY WITH STENT PLACEMENT  12/29/12   STEMI with promus DES to LAD   CORONARY ANGIOPLASTY WITH STENT PLACEMENT  01/05/13   STEMI with thrombosis in stent to LAD   FIDUCIAL MARKER PLACEMENT  12/28/2021   Procedure: FIDUCIAL MARKER PLACEMENT;  Surgeon: Leslye Peer, MD;  Location: [facility] ENDOSCOPY;  Service: Pulmonary;;   LEFT HEART CATH N/A 12/29/2012   Procedure: LEFT HEART CATH;  Surgeon: Corky Crafts, MD;  Location: [facility] CATH LAB;   Service: Cardiovascular;  Laterality: N/A;   LEFT HEART CATHETERIZATION WITH CORONARY ANGIOGRAM N/A 01/05/2013   Procedure: LEFT HEART CATHETERIZATION WITH CORONARY ANGIOGRAM;  Surgeon: Micheline Chapman, MD;  Location: [facility] CATH LAB;  Service: Cardiovascular;  Laterality: N/A;   LEFT HEART CATHETERIZATION WITH CORONARY ANGIOGRAM N/A 04/22/2013   Procedure: LEFT HEART CATHETERIZATION WITH CORONARY ANGIOGRAM;  Surgeon: Lennette Bihari, MD;  Location: [facility] CATH LAB;  Service: Cardiovascular;  Laterality: N/A;   PARTIAL NEPHRECTOMY Left 1975   PATIENT ONLY HAS ONE KIDNEY   PERCUTANEOUS CORONARY STENT INTERVENTION (PCI-S)  12/29/2012   Procedure: PERCUTANEOUS CORONARY STENT INTERVENTION (PCI-S);  Surgeon: Corky Crafts, MD;  Location: [facility] CATH LAB;  Service: Cardiovascular;;   TEE WITHOUT CARDIOVERSION N/A 05/10/2022   Procedure: TRANSESOPHAGEAL ECHOCARDIOGRAM (TEE);  Surgeon: Sande Rives, MD;  Location: [facility] ENDOSCOPY;  Service: Cardiovascular;  Laterality: N/A;   TEE WITHOUT CARDIOVERSION N/A 05/28/2022   Procedure: TRANSESOPHAGEAL ECHOCARDIOGRAM (TEE);  Surgeon: Wendall Stade, MD;  Location: [facility] ENDOSCOPY;  Service: Cardiovascular;  Laterality: N/A;   Social History:  reports that he quit smoking about 23 years ago. His smoking use included cigarettes. He has a 50.00 pack-year smoking history. His smokeless tobacco use includes chew. He reports that he does not currently use alcohol. He reports current drug use. Drug: Marijuana.  Allergies  Allergen Reactions   Levitra [Vardenafil] Other (See Comments)    Blurred vision   Aspirin Hives   Lipitor [Atorvastatin] Other (See Comments)    Muscle cramps; patient states he does not take medication at home    Family History  Problem Relation Age of Onset   Alzheimer's disease Mother    CAD Father     Prior to Admission medications   Medication Sig Start Date End Date Taking? Authorizing Provider  amiodarone (PACERONE) 200 MG tablet Take 1  tablet (200 mg total) by mouth daily. 06/18/22   Corrin Parker, PA-C  buPROPion (WELLBUTRIN XL) 150 MG 24 hr tablet Take 1 tablet (150 mg total) by mouth daily. 05/24/22   Elgergawy, Leana Roe, MD  Cyanocobalamin (VITAMIN B-12 IJ) Inject as directed every 30 (thirty) days.    [provider]  empagliflozin (JARDIANCE) 10 MG TABS tablet Take 1 tablet (10 mg total) by mouth daily. 06/18/22   Marjie Skiff E, PA-C  fluticasone (FLONASE) 50 MCG/ACT nasal spray Place 1 spray into both nostrils daily. 12/24/21   [provider]  furosemide (LASIX) 40 MG tablet Take 1 tablet (40 mg total) by mouth daily. 06/18/22   Marjie Skiff E, PA-C  pantoprazole (PROTONIX) 40 MG tablet Take 1 tablet (40 mg total) by mouth daily. 05/11/22   Pokhrel, Rebekah Chesterfield, MD  PARoxetine (PAXIL) 30 MG tablet Take 30 mg by mouth daily.    [provider]  potassium chloride (KLOR-CON M) 10 MEQ tablet Take 1 tablet (10 mEq total) by mouth daily as needed (take wtih lasix only). 06/14/22   Fenton, Clint R, PA  pregabalin (LYRICA) 150 MG capsule  Take 1 capsule (150 mg total) by mouth 2 (two) times daily. 05/24/22   Elgergawy, Leana Roe, MD  QUEtiapine (SEROQUEL) 25 MG tablet Take 1 tablet (25 mg total) by mouth at bedtime. 05/10/22   Pokhrel, Rebekah Chesterfield, MD  rivaroxaban (XARELTO) 20 MG TABS tablet Take 1 tablet (20 mg total) by mouth daily with supper. 06/18/22   Marjie Skiff E, PA-C  rosuvastatin (CRESTOR) 20 MG tablet Take 1 tablet (20 mg total) by mouth daily. 06/18/22   Marjie Skiff E, PA-C  sacubitril-valsartan (ENTRESTO) 24-26 MG Take 1 tablet by mouth 2 (two) times daily. 06/18/22   Corrin Parker, PA-C  spironolactone (ALDACTONE) 25 MG tablet Take 1/2 tablet (12.5 mg total) by mouth daily. 06/18/22   Corrin Parker, PA-C    Physical Exam: Vitals:   07/30/22 1243 07/30/22 1248 07/30/22 1611 07/30/22 1627  BP: (!) 175/67  (!) 154/56 (!) 142/53  Pulse: (!) 55  (!) 53 (!) 51  Resp: 20  15 16    Temp: 98 F (36.7 C)  98.1 F (36.7 C) 98.1 F (36.7 C)  TempSrc: Oral   Oral  SpO2: 93%  99% 100%  Weight:  101.6 kg    Height:  5\' 10"  (1.778 m)     General exam: Awake, laying in bed, in nad Respiratory system: Normal respiratory effort, no wheezing Cardiovascular system: regular rate, s1, s2 Gastrointestinal system: Soft, nondistended, positive BS Central nervous system: CN2-12 grossly intact, strength intact Extremities: Perfused, no clubbing Skin: Normal skin turgor, no notable skin lesions seen Psychiatry: Mood normal // no visual hallucinations   Data Reviewed:  Labs reviewed: Na 140, K 3.8, Cr 1.67, Hgb 6.7 (12.6 on 3/30)   Assessment and Plan: Acute blood loss anemia -New hgb drop of 6.7 from 12.6 on 3/30 -Hemaccult pos in ED per EDP. GI consulted per ED -Currently pt is receiving 1 unit PRBC -Hold Xarelto, last dose 6/2 -Follow CBC trends and transfuse as needed  Afib -Pt is s/p prior DCCV -Currently rate controlled -holding further xarelto per above -cont home amiodarone  CAD -no chest pain or sob  HTN -BP stable -cont home regimen  OSA -reportedly does not use CPAP  HFrEF chronic, present on admit -Most recent EF of 40-45% noted -Recently noted to have increased LE edema -Will continue home lasix for now, would give additional 20mg  IV lasix after blood infusion  CKD IIIa -baseline Cr around 1.5 -Presenting Cr 1.67 -Cont home meds for now -recheck bmet in AM   Advance Care Planning:   Code Status: Prior Full  Consults: GI  Family Communication: Pt in room, family not at bedside  Severity of Illness: The appropriate patient status for this patient is OBSERVATION. Observation status is judged to be reasonable and necessary in order to provide the required intensity of service to ensure the patient's safety. The patient's presenting symptoms, physical exam findings, and initial radiographic and laboratory data in the context of their medical  condition is felt to place them at decreased risk for further clinical deterioration. Furthermore, it is anticipated that the patient will be medically stable for discharge from the hospital within 2 midnights of admission.   Author: Rickey Barbara, MD 07/30/2022 5:16 PM  For on call review [email or web address].

## 2022-07-31 DIAGNOSIS — Z7901 Long term (current) use of anticoagulants: Secondary | ICD-10-CM | POA: Diagnosis not present

## 2022-07-31 DIAGNOSIS — D62 Acute posthemorrhagic anemia: Secondary | ICD-10-CM | POA: Diagnosis not present

## 2022-07-31 DIAGNOSIS — R195 Other fecal abnormalities: Secondary | ICD-10-CM | POA: Diagnosis not present

## 2022-07-31 DIAGNOSIS — D649 Anemia, unspecified: Secondary | ICD-10-CM | POA: Diagnosis not present

## 2022-07-31 LAB — IRON AND TIBC
Iron: 12 ug/dL — ABNORMAL LOW (ref 45–182)
Saturation Ratios: 3 % — ABNORMAL LOW (ref 17.9–39.5)
TIBC: 413 ug/dL (ref 250–450)
UIBC: 401 ug/dL

## 2022-07-31 LAB — COMPREHENSIVE METABOLIC PANEL
ALT: 17 U/L (ref 0–44)
AST: 17 U/L (ref 15–41)
Albumin: 3.1 g/dL — ABNORMAL LOW (ref 3.5–5.0)
Alkaline Phosphatase: 67 U/L (ref 38–126)
Anion gap: 9 (ref 5–15)
BUN: 16 mg/dL (ref 8–23)
CO2: 28 mmol/L (ref 22–32)
Calcium: 8.3 mg/dL — ABNORMAL LOW (ref 8.9–10.3)
Chloride: 105 mmol/L (ref 98–111)
Creatinine, Ser: 1.65 mg/dL — ABNORMAL HIGH (ref 0.61–1.24)
GFR, Estimated: 45 mL/min — ABNORMAL LOW (ref >60)
Glucose, Bld: 88 mg/dL (ref 70–99)
Potassium: 3.3 mmol/L — ABNORMAL LOW (ref 3.5–5.1)
Sodium: 142 mmol/L (ref 135–145)
Total Bilirubin: 0.9 mg/dL (ref 0.3–1.2)
Total Protein: 5.7 g/dL — ABNORMAL LOW (ref 6.5–8.1)

## 2022-07-31 LAB — CBC
HCT: 24.7 % — ABNORMAL LOW (ref 39.0–52.0)
Hemoglobin: 7.7 g/dL — ABNORMAL LOW (ref 13.0–17.0)
MCH: 26.1 pg (ref 26.0–34.0)
MCHC: 31.2 g/dL (ref 30.0–36.0)
MCV: 83.7 fL (ref 80.0–100.0)
Platelets: 172 10*3/uL (ref 150–400)
RBC: 2.95 MIL/uL — ABNORMAL LOW (ref 4.22–5.81)
RDW: 15 % (ref 11.5–15.5)
WBC: 9.1 10*3/uL (ref 4.0–10.5)
nRBC: 0 % (ref 0.0–0.2)

## 2022-07-31 LAB — BPAM RBC
Blood Product Expiration Date: 202406302359
ISSUE DATE / TIME: 202406031550

## 2022-07-31 LAB — TYPE AND SCREEN
ABO/RH(D): O POS
Antibody Screen: NEGATIVE

## 2022-07-31 LAB — VITAMIN B12: Vitamin B-12: 471 pg/mL (ref 180–914)

## 2022-07-31 LAB — FOLATE: Folate: 32.7 ng/mL (ref >5.9)

## 2022-07-31 LAB — OCCULT BLOOD, POC DEVICE
Fecal Occult Bld: POSITIVE — AB
Fecal Occult Bld: POSITIVE — AB
Fecal Occult Bld: POSITIVE — AB

## 2022-07-31 MED ORDER — POTASSIUM CHLORIDE CRYS ER 20 MEQ PO TBCR
60.0000 meq | EXTENDED_RELEASE_TABLET | Freq: Once | ORAL | Status: AC
  Administered 2022-07-31: 60 meq via ORAL
  Filled 2022-07-31: qty 3

## 2022-07-31 MED ORDER — FERROUS SULFATE 325 (65 FE) MG PO TBEC: 325.0000 mg | DELAYED_RELEASE_TABLET | Freq: Two times a day (BID) | ORAL | 0 refills | Status: DC

## 2022-07-31 MED ORDER — DOCUSATE SODIUM 100 MG PO CAPS: 100.0000 mg | ORAL_CAPSULE | Freq: Two times a day (BID) | ORAL | 0 refills | Status: DC

## 2022-07-31 MED ORDER — SODIUM CHLORIDE 0.9 % IV SOLN
250.0000 mg | Freq: Every day | INTRAVENOUS | Status: DC
  Administered 2022-07-31: 250 mg via INTRAVENOUS
  Filled 2022-07-31: qty 20

## 2022-07-31 NOTE — Hospital Course (Signed)
72 y.o. male with medical history significant of CAD, HLD, OSA not on CPAP, HTN, afib on xarelto who presents to ED with concerns of "abmormal" lab value. Pt was recently discharged on 05/29/22 for afib RVR and falls resulting in rib fractures, ultimately being discharged on continued xarelto. Pt reports being in his usual state of health with rib rx symptoms resolving over time. Denies chest pains, sob, abd pain or nausea. Pt had been tolerating diet well. Denies BRBPR or melena   Chart reviewed. HHRN noted increase in wt prior to admit of with concerns of vol overload. Prior to increasing lasix dose, pt was scheduled for PCP f/u on 6/11 with lab drawn prior. Outpt labs were noted to be "abnormal" and pt was referred to the ED for further work up.   In the ED, pt was found to have Cr 1.67 with hgb 6.7 (was 12.6 on 3/30). LE doppler was done and was neg for DVT. Pt was found to be heme pos. GI was consulted by EDP and hospitalist consulted for consideration for admission

## 2022-07-31 NOTE — Discharge Summary (Signed)
Physician Discharge Summary   Patient: Brendan Butler MRN: [id number] DOB: 1950/03/03  Admit date:     07/30/2022  Discharge date: 07/31/22  Discharge Physician: Rickey Barbara   PCP: [name], MD   Recommendations at discharge:    Follow up with PCP in 1-2 weeks Follow up with Dr. Lorenso Quarry as scheduled Recommend repeat CBC in 1 week  Discharge Diagnoses: Principal Problem:   Acute blood loss anemia  Resolved Problems:   * No resolved hospital problems. *  Hospital Course: 73 y.o. male with medical history significant of CAD, HLD, OSA not on CPAP, HTN, afib on xarelto who presents to ED with concerns of "abmormal" lab value. Pt was recently discharged on 05/29/22 for afib RVR and falls resulting in rib fractures, ultimately being discharged on continued xarelto. Pt reports being in his usual state of health with rib rx symptoms resolving over time. Denies chest pains, sob, abd pain or nausea. Pt had been tolerating diet well. Denies BRBPR or melena   Chart reviewed. HHRN noted increase in wt prior to admit of with concerns of vol overload. Prior to increasing lasix dose, pt was scheduled for PCP f/u on 6/11 with lab drawn prior. Outpt labs were noted to be "abnormal" and pt was referred to the ED for further work up.   In the ED, pt was found to have Cr 1.67 with hgb 6.7 (was 12.6 on 3/30). LE doppler was done and was neg for DVT. Pt was found to be heme pos. GI was consulted by EDP and hospitalist consulted for consideration for admission  Assessment and Plan: Acute blood loss anemia -Presented with new hgb drop of 6.7 from 12.6 on 3/30 -Hemaccult pos in ED per EDP. GI consulted per ED -Currently pt responded appropriately to 1 unit PRBC -GI was consulted. Colonoscopy was recommended, however pt declined -Discussed with Dr. Lorenso Quarry. OK to resume anticoagulation on d/c. Pt to f/u closely with GI as outpatient -Iron levels were low, pt now s/p IV iron. Have prescribed iron on  d/c   Afib -Pt is s/p prior DCCV -Currently rate controlled -cont home amiodarone -OK to continue xarelto per Dr. Lorenso Quarry   CAD -no chest pain or sob   HTN -BP stable -cont home regimen   OSA -reportedly does not use CPAP   HFrEF chronic, present on admit -Most recent EF of 40-45% noted -Recently noted to have increased LE edema -Pt was given 20mg  IV lasix x 1 after blood transfusion. Continue diuretic at home   CKD IIIa -baseline Cr around 1.5 -Presenting Cr 1.67 -resumed home meds -Renal function remained stable          Consultants: GI Procedures performed:   Disposition: Home Diet recommendation:  Cardiac diet DISCHARGE MEDICATION: Allergies as of 07/31/2022       Reactions   Levitra [vardenafil] Other (See Comments)   Blurred vision   Aspirin Hives   Lipitor [atorvastatin] Other (See Comments)   Muscle cramps; patient states he does not take medication at home        Medication List     TAKE these medications    amiodarone 200 MG tablet Commonly known as: PACERONE Take 1 tablet (200 mg total) by mouth daily.   buPROPion 150 MG 24 hr tablet Commonly known as: WELLBUTRIN XL Take 1 tablet (150 mg total) by mouth daily.   docusate sodium 100 MG capsule Commonly known as: Colace Take 1 capsule (100 mg total) by mouth 2 (two) times daily.  empagliflozin 10 MG Tabs tablet Commonly known as: JARDIANCE Take 1 tablet (10 mg total) by mouth daily.   ferrous sulfate 325 (65 FE) MG EC tablet Take 1 tablet (325 mg total) by mouth 2 (two) times daily.   fluticasone 50 MCG/ACT nasal spray Commonly known as: FLONASE Place 1 spray into both nostrils daily.   furosemide 40 MG tablet Commonly known as: Lasix Take 1 tablet (40 mg total) by mouth daily.   pantoprazole 40 MG tablet Commonly known as: PROTONIX Take 1 tablet (40 mg total) by mouth daily.   PARoxetine 30 MG tablet Commonly known as: PAXIL Take 30 mg by mouth daily.   potassium  chloride 10 MEQ tablet Commonly known as: KLOR-CON M Take 1 tablet (10 mEq total) by mouth daily as needed (take wtih lasix only).   pregabalin 150 MG capsule Commonly known as: LYRICA Take 1 capsule (150 mg total) by mouth 2 (two) times daily. What changed: when to take this   QUEtiapine 25 MG tablet Commonly known as: SEROQUEL Take 1 tablet (25 mg total) by mouth at bedtime.   rivaroxaban 20 MG Tabs tablet Commonly known as: XARELTO Take 1 tablet (20 mg total) by mouth daily with supper.   rosuvastatin 20 MG tablet Commonly known as: CRESTOR Take 1 tablet (20 mg total) by mouth daily.   sacubitril-valsartan 24-26 MG Commonly known as: ENTRESTO Take 1 tablet by mouth 2 (two) times daily.   spironolactone 25 MG tablet Commonly known as: ALDACTONE Take 1/2 tablet (12.5 mg total) by mouth daily.   VITAMIN B-12 IJ Inject as directed every 30 (thirty) days.        Follow-up Information     [name], MD Follow up in 2 week(s).   Specialty: Internal Medicine Why: Hospital follow up Contact information: [address] [place] [address] [phone]         Liliane Shi H, DO Follow up in 1 week(s).   Specialty: Gastroenterology Why: Hospital follow up Contact information: [address] [place] [address] [place] [address] [phone]                Discharge Exam: Ceasar Mons Weights   07/30/22 1248  Weight: 101.6 kg   General exam: Awake, laying in bed, in nad Respiratory system: Normal respiratory effort, no wheezing Cardiovascular system: regular rate, s1, s2 Gastrointestinal system: Soft, nondistended, positive BS Central nervous system: CN2-12 grossly intact, strength intact Extremities: Perfused, no clubbing Skin: Normal skin turgor, no notable skin lesions seen Psychiatry: Mood normal // no visual hallucinations   Condition at discharge: fair  The results of significant diagnostics from this hospitalization (including  imaging, microbiology, ancillary and laboratory) are listed below for reference.   Imaging Studies: VAS [place] LOWER EXTREMITY VENOUS (DVT) (7a-7p)  Result Date: 07/30/2022  Lower Venous DVT Study Patient Name:  Brendan Butler  Date of Exam:   07/30/2022 Medical Rec #: [id number]         Accession #:    [id number] Date of Birth: [birth year]        Patient Gender: M Patient Age:   14 years Exam Location:  [facility] Procedure:      VAS [place] LOWER EXTREMITY VENOUS (DVT) Referring Phys: Velda Shell BLUE --------------------------------------------------------------------------------  Indications: RLE swelling after medication changes.  Comparison Study: Previous 01/27/16 negative. Performing Technologist: McKayla Maag RVT, [place]  Examination Guidelines: A complete evaluation includes B-mode imaging, spectral Doppler, color Doppler, and power Doppler as needed of  all accessible portions of each vessel. Bilateral testing is considered an integral part of a complete examination. Limited examinations for reoccurring indications may be performed as noted. The reflux portion of the exam is performed with the patient in reverse Trendelenburg.  +---------+---------------+---------+-----------+----------+--------------+ RIGHT    CompressibilityPhasicitySpontaneityPropertiesThrombus Aging +---------+---------------+---------+-----------+----------+--------------+ CFV      Full           Yes      Yes                                 +---------+---------------+---------+-----------+----------+--------------+ SFJ      Full                                                        +---------+---------------+---------+-----------+----------+--------------+ FV Prox  Full                                                        +---------+---------------+---------+-----------+----------+--------------+ FV Mid   Full                                                         +---------+---------------+---------+-----------+----------+--------------+ FV DistalFull                                                        +---------+---------------+---------+-----------+----------+--------------+ PFV      Full                                                        +---------+---------------+---------+-----------+----------+--------------+ POP      Full           Yes      Yes                                 +---------+---------------+---------+-----------+----------+--------------+ PTV      Full                                                        +---------+---------------+---------+-----------+----------+--------------+ PERO     Full                                                        +---------+---------------+---------+-----------+----------+--------------+   +----+---------------+---------+-----------+----------+--------------+ LEFTCompressibilityPhasicitySpontaneityPropertiesThrombus Aging +----+---------------+---------+-----------+----------+--------------+  CFV Full           Yes      Yes                                 +----+---------------+---------+-----------+----------+--------------+ SFJ Full                                                        +----+---------------+---------+-----------+----------+--------------+     Summary: RIGHT: - There is no evidence of deep vein thrombosis in the lower extremity.  - No cystic structure found in the popliteal fossa.  LEFT: - No evidence of common femoral vein obstruction.  *See table(s) above for measurements and observations. Electronically signed by Lemar Livings MD on 07/30/2022 at 4:39:10 PM.    Final     Microbiology: Results for orders placed or performed during the hospital encounter of 12/24/21  Surgical pcr screen     Status: None   Collection Time: 12/25/21  8:20 PM   Specimen: Nasal Mucosa; Nasal Swab  Result Value Ref Range Status   MRSA, PCR NEGATIVE NEGATIVE  Final   Staphylococcus aureus NEGATIVE NEGATIVE Final    Comment: (NOTE) The Xpert SA Assay (FDA approved for NASAL specimens in patients 62 years of age and older), is one component of a comprehensive surveillance program. It is not intended to diagnose infection nor to guide or monitor treatment. Performed at [facility], [address] [place], [place] [address]   SARS Coronavirus 2 by RT PCR (hospital order, performed in [facility] hospital lab) *cepheid single result test* Anterior Nasal Swab     Status: None   Collection Time: 12/27/21  4:18 PM   Specimen: Anterior Nasal Swab  Result Value Ref Range Status   SARS Coronavirus 2 by RT PCR NEGATIVE NEGATIVE Final    Comment: (NOTE) SARS-CoV-2 target nucleic acids are NOT DETECTED.  The SARS-CoV-2 RNA is generally detectable in upper and lower respiratory specimens during the acute phase of infection. The lowest concentration of SARS-CoV-2 viral copies this assay can detect is 250 copies / mL. A negative result does not preclude SARS-CoV-2 infection and should not be used as the sole basis for treatment or other patient management decisions.  A negative result may occur with improper specimen collection / handling, submission of specimen other than nasopharyngeal swab, presence of viral mutation(s) within the areas targeted by this assay, and inadequate number of viral copies (<250 copies / mL). A negative result must be combined with clinical observations, patient history, and epidemiological information.  Fact Sheet for Patients:   RoadLapTop.co.za  Fact Sheet for Healthcare Providers: http://kim-miller.com/  This test is not yet approved or  cleared by the [place] FDA and has been authorized for detection and/or diagnosis of SARS-CoV-2 by FDA under an Emergency Use Authorization (EUA).  This EUA will remain in effect (meaning this test can be used) for the duration  of the COVID-19 declaration under Section 564(b)(1) of the Act, 21 U.S.C. section 360bbb-3(b)(1), unless the authorization is terminated or revoked sooner.  Performed at [facility], [address], [place], [place] [address]   Fungus Culture With Stain     Status: None   Collection Time: 12/28/21  1:57 PM   Specimen:  Bronchial Alveolar Lavage; Respiratory  Result Value Ref Range Status   Fungus Stain Final report  Final   Fungus (Mycology) Culture Final report  Final    Comment: (NOTE) Performed At: [facility] [address] [place], [place] [id number] Jolene Schimke MD WU:[id number]    Fungal Source BRONCHIAL ALVEOLAR LAVAGE  Final    Comment: Performed at [facility], [address], [place], [place] [address]  Culture, BAL-quantitative w Gram Stain     Status: None   Collection Time: 12/28/21  1:57 PM   Specimen: Bronchial Alveolar Lavage; Respiratory  Result Value Ref Range Status   Specimen Description BRONCHIAL ALVEOLAR LAVAGE  Final   Special Requests NONE  Final   Gram Stain NO WBC SEEN NO ORGANISMS SEEN   Final   Culture   Final    NO GROWTH 2 DAYS Performed at [facility], [address] [place], [place] [address]    Report Status 12/30/2021 FINAL  Final  Aerobic/Anaerobic Culture w Gram Stain (surgical/deep wound)     Status: None   Collection Time: 12/28/21  1:57 PM   Specimen: Bronchial Alveolar Lavage; Respiratory  Result Value Ref Range Status   Specimen Description BRONCHIAL ALVEOLAR LAVAGE  Final   Special Requests NONE  Final   Gram Stain   Final    RARE WBC PRESENT,BOTH PMN AND MONONUCLEAR NO ORGANISMS SEEN    Culture   Final    No growth aerobically or anaerobically. Performed at [facility], [address], [place], [place] [address]    Report Status 01/02/2022 FINAL  Final  Fungus Culture Result     Status: None   Collection Time: 12/28/21  1:57 PM  Result Value Ref Range Status   Result 1 Comment   Final    Comment: (NOTE) KOH/Calcofluor preparation:  no fungus observed. Performed At: [facility] [address] [place], [place] [id number] Jolene Schimke MD KG:[id number]   Fungal organism reflex     Status: None   Collection Time: 12/28/21  1:57 PM  Result Value Ref Range Status   Fungal result 1 Comment  Final    Comment: (NOTE) No yeast or mold isolated after 4 weeks. Performed At: [facility] [address] [place], [place] [id number] Jolene Schimke MD VZ:[id number]   Acid Fast Smear (AFB)     Status: None   Collection Time: 12/28/21  2:00 PM   Specimen: Lung; Respiratory  Result Value Ref Range Status   AFB Specimen Processing Concentration  Final   Acid Fast Smear Negative  Final    Comment: (NOTE) Performed At: [facility] [address] [place], [place] [id number] Jolene Schimke MD SA:[id number]    Source (AFB) BRONCHIAL ALVEOLAR LAVAGE  Final    Comment: Performed at [facility], [address], [place], [place] [address]  Acid Fast Culture with reflexed sensitivities     Status: None   Collection Time: 12/28/21  2:00 PM   Specimen: Lung; Respiratory  Result Value Ref Range Status   Acid Fast Culture Negative  Final    Comment: (NOTE) No acid fast bacilli isolated after 6 weeks. Performed At: [facility] [address] [place], [place] [id number] Jolene Schimke MD CW:[id number]    Source of Sample BRONCHIAL ALVEOLAR LAVAGE  Final    Comment: Performed at [facility], [address], [place], [place] [address]    Labs: CBC: Recent Labs  Lab 07/30/22 1300 07/30/22 2358  WBC 9.4 9.1  HGB 6.7* 7.7*  HCT 22.1* 24.7*  MCV 85.7 83.7  PLT 172 172   Basic Metabolic Panel: Recent Labs  Lab 07/30/22 1300 07/30/22 2358  NA 140 142  K 3.8 3.3*  CL 103 105  CO2 28 28  GLUCOSE 120* 88  BUN 16 16  CREATININE 1.67* 1.65*  CALCIUM 8.3* 8.3*   Liver Function Tests: Recent Labs  Lab 07/30/22 2358   AST 17  ALT 17  ALKPHOS 67  BILITOT 0.9  PROT 5.7*  ALBUMIN 3.1*   CBG: No results for input(s): "GLUCAP" in the last 168 hours.  Discharge time spent: less than 30 minutes.  Signed: Rickey Barbara, MD [facility] 07/31/2022
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
## 2022-07-31 NOTE — Progress Notes (Signed)
Discharge instructions given to pt. Pt verbalized understanding of all teaching and had no further questions. 

## 2022-07-31 NOTE — Care Management Obs Status (Signed)
MEDICARE OBSERVATION STATUS NOTIFICATION   Patient Details  Name: Brendan Butler MRN: [id number] Date of Birth: November 08, 1950   Medicare Observation Status Notification Given:  Yes    Kingsley Plan, RN 07/31/2022, 12:15 PM

## 2022-07-31 NOTE — Progress Notes (Signed)
Pt discharged to home via wheelchair with all belongings and paperwork 

## 2022-07-31 NOTE — TOC Initial Note (Signed)
Transition of Care (TOC) - Initial/Assessment Note   Spoke to patient at bedside. Confirmed face sheet information. Has PCP and transportation   Lives alone. Does not use any DME.   No discharge needs identified at this time  Patient Details  Name: Brendan Butler MRN: [id number] Date of Birth: 1950-07-05  Transition of Care [facility]) CM/SW Contact:    Kingsley Plan, RN Phone Number: 07/31/2022, 12:17 PM  Clinical Narrative:                   Expected Discharge Plan: Home/Self Care Barriers to Discharge: Continued Medical Work up   Patient Goals and CMS Choice Patient states their goals for this hospitalization and ongoing recovery are:: to return to home     [place] ownership interest in [facility].provided to:: Patient    Expected Discharge Plan and Services   Discharge Planning Services: CM Consult   Living arrangements for the past 2 months: Single Family Home                 DME Arranged: N/A         HH Arranged: NA          Prior Living Arrangements/Services Living arrangements for the past 2 months: Single Family Home Lives with:: Self Patient language and need for interpreter reviewed:: Yes Do you feel safe going back to the place where you live?: Yes      Need for Family Participation in Patient Care: No (Comment) Care giver support system in place?: Yes (comment)   Criminal Activity/Legal Involvement Pertinent to Current Situation/Hospitalization: No - Comment as needed  Activities of Daily Living Home Assistive Devices/Equipment: None ADL Screening (condition at time of admission) Patient's cognitive ability adequate to safely complete daily activities?: Yes Is the patient deaf or have difficulty hearing?: No Does the patient have difficulty seeing, even when wearing glasses/contacts?: No Does the patient have difficulty concentrating, remembering, or making decisions?: No Patient able to express need for assistance with ADLs?:  Yes Does the patient have difficulty dressing or bathing?: No Independently performs ADLs?: Yes (appropriate for developmental age) Does the patient have difficulty walking or climbing stairs?: No Weakness of Legs: None Weakness of Arms/Hands: None  Permission Sought/Granted   Permission granted to share information with : No              Emotional Assessment Appearance:: Appears stated age Attitude/Demeanor/Rapport: Engaged Affect (typically observed): Accepting Orientation: : Oriented to Self, Oriented to Place, Oriented to  Time, Oriented to Situation Alcohol / Substance Use: Not Applicable Psych Involvement: No (comment)  Admission diagnosis:  Acute blood loss anemia [D62] Symptomatic anemia [D64.9] Patient Active Problem List   Diagnosis Date Noted   Acute blood loss anemia 07/30/2022   Hypercoagulable state due to typical atrial flutter (HCC) 06/14/2022   Falls frequently 05/23/2022   Atrial fibrillation with RVR (HCC) 05/20/2022   MDD (major depressive disorder), recurrent episode, mild (HCC) 05/10/2022   Delirium due to another medical condition 05/10/2022   Atrial flutter with rapid ventricular response (HCC) 05/06/2022   Hypokalemia 05/06/2022   Altered mental status 05/06/2022   Pulmonary nodules 12/27/2021   Acute cholecystitis 12/25/2021   Symptomatic bradycardia 12/25/2021   Preoperative cardiovascular examination    Intra-abdominal abscess (HCC) 01/17/2021   Lung nodule 01/17/2021   Acute-on-chronic kidney injury (HCC) 01/17/2021   Aneurysm of ascending aorta (HCC) 01/17/2021   Stage 3a chronic kidney disease (CKD) (HCC) 01/17/2021   Weight loss  01/17/2021   Bronchiectasis (HCC) 01/17/2021   Chronic cholecystitis 01/16/2021   Traumatic hematoma of left popliteal region 01/28/2016   DJD (degenerative joint disease) of pelvis 01/28/2016   Atypical chest pain    Chest pain 01/27/2016   Unstable angina (HCC) 04/21/2013   Leukocytosis 04/21/2013    Subsequent ST elevation (STEMI) myocardial infarction of anterior wall within 4 weeks of initial infarction [facility]) 01/08/2013    Class: Hospitalized for   Illiteracy and low-level literacy 01/08/2013   Aspirin allergy- hives 01/06/2013   STEMI 12/29/12 Rx'd with LAD DES with early stent thrombosis, STEMI-PCI 01/05/14 01/05/2013   Coronary artery disease involving native coronary artery of native heart without angina pectoris 06/29/2007   Dyslipidemia 06/25/2007   Essential hypertension 06/25/2007   ALLERGIC RHINITIS 06/25/2007   OSA on CPAP 06/25/2007   PCP:  [name], MD Pharmacy:   [facility] [phone] - [place], [place] - [address] AT [facility] [address] [place] [address] Phone: [phone] Fax: [phone]  [facility] - [place], [place] - [address] [place] [address] Phone: [phone] Fax: [phone]  [facility] [address] [place] [address] Phone: [phone] Fax: [phone]     Social Determinants of Health (SDOH) Social History: SDOH Screenings   Food Insecurity: No Food Insecurity (05/21/2022)  Housing: Low Risk  (05/21/2022)  Transportation Needs: No Transportation Needs (05/21/2022)  Utilities: Not At Risk (05/21/2022)  Tobacco Use: High Risk (07/30/2022)   SDOH Interventions:     Readmission Risk Interventions     No data to display

## 2022-08-04 NOTE — Progress Notes (Deleted)
Cardiology Office Note:    Date:  08/04/2022   ID:  [name], DOB [birth year], MRN [id number]  PCP:  [name], MD  Cardiologist:  Charlton Haws, MD  Electrophysiologist:  None   Referring MD: [name], MD   Chief Complaint: routine follow-up of CAD, CHF, and atrial flutter  History of Present Illness:    Brendan Butler is a 72 y.o. male with a history of CAD s/p multiple PCIs to LAD (most recently in 2015), chronic HFrEF with EF as low as 35-40% in 12/2012 with normalization to 55-60% in 11/2021 but then down again to 20-25% on TEE in 04/2202, persistent atrial flutter s/p DCCV in 04/2022 and again in 05/2022 on Xarelto, sinus pauses, hypertension, hyperlipidemia, CKD stage III, obstructive sleep apnea, pulmonary nodules, and anxiety/depression who is followed by Dr. Eden Emms and presents today for routine follow-up.   Patient has a long history of CAD with multiple interventions to LAD dating back to 2001. He was admitted in 12/2012 for anterior STEMI and underwent underwent PCI with DES to LAD. He presented back to the hospital one week later for another anterior STEMI complicated by ventricular fibrillation on the cath lab table requiring defibrillation. He was found to have acute in-stent thrombosis of recently placed LAD stent. Intravascular ultrasound was then performed in order to define the mechanism of stent thrombosis. There previously implanted stent appeared well expanded throughout but there was an area of mal apposition in the distal stent. He underwent successful PCI with balloon angioplasty of the entire stented segment with 0% residual stenosis. Echo at that time showed LVEF of 40-45% with hypokinesis sof the anterior, anteroseptal, and apical segments consistent with LAD territory infarct. He was initially treated with IV Amiodarone given ventricular fibrillation but had no recurrence after revascularization so this was ultimately stopped. He was switched from  [place] to Effient at that time. Repeat LHC in 03/2013 for recurrent chest pain showed 70% in-stent restenosis of prior LAD and underwent repeat balloon angioplasty. He was lost to follow-up in our office after 2015 except for being seen in consult in the hospital in 2017 for chest pain (felt to be atypical) and in 11/2021 for pre-op evaluation for lap chole in setting of acute cholecystitis and multiple episodes of near syncope/ syncope with bradycardia noted in the ED. Echo at that time showed normalization of EF to 55-60% with hypokinesis of the entire anterior septum and grade 1 diastolic dysfunction. He was felt to be at acceptable risk for surgery and had no evidence of high grade AV block during admission. He was again lost to follow-up after this admission and not seen again until his most recent hospitalization.   He was admitted on 05/06/2022 for altered mental status and delusional thoughts and was incidentally found to be in new onset rapid atrial flutter. He was started on IV Cardizem and Eliquis and then underwent successful TEE/ DCCV on 05/10/2022 with return of sinus rhythm. TEE showed newly reduced EF of 20-25% with global hypokinesis.  He left AMA later that day before medications could be optimized. However, Effient was switched to Plavix given need for Eliquis. He was readmitted on 05/20/2022 for worsening altered mental status ad found to be back in atrial flutter with RVR. He had not been compliant with his anticoagulation. He was switched from Eliquis to Xarelto to help encourage compliance and was started on IV Amiodarone and Metoprolol. Amiodarone was discontinued due to concern about long-term use of this in  the setting of non-compliance. He remained rate controlled without the Amiodarone so the initial plan was to wait on repeat DCCV until he could prove compliance with anticoagulation as an outpatient. GDMT was adjusted.  He was noted to have some pauses on telemetry so a live Zio monitor  was placed and plan was for discharge to SNF on 05/24/2022; however, while waiting for transportation to SNF, he was noted to have a 4 second pause followed by a 5.8 second pause.  Therefore, his discharge was cancelled and his beta-blocker was stopped. EP was consulted and he was restarted on IV Amiodarone and then underwent repeat TEE/DCCV on 05/28/2022 with restoration of sinus rhythm. EP recommended avoiding permament pacemaker if at all possible given compliance issues. He was switched to PO Amiodarone and no recurrent pauses were noted. Of note, Plavix was also stopped this admission.    He was last seen by me in 05/2022 at which time he was maintaining sinus rhythm and his mental status had improved. However, he was having some lower extremity edema. His PCP had prescribed Lasix 20mg  but he had no improvement with this so he increased the dose himself to 40mg  every other day. Weight weight was also up about 13 lbs from discharge. BNP was normal and D-dimer was negative when adjusted for age. Lasix was increased to 40mg  daily. Repeat Echo was also oredered to reassess LV function given he was maintaining sinus rhythm.   He was recently admitted from 07/30/2022 to 07/31/2022 for acute blood loss anemia after outpatient labs showed hemoglobin of 6.7. Hemoccult was positive. He was transfused 1 unit of PRBCs GI was consulted and recommended a colonoscopy but patient declined. Hemoglobin improved to 7.7 prior to discharge and GI gave the OK for him to continue Xarelto with close follow-up. Iron levels were low so he was given one dose of IV iron and then prescribed PO iron at discharge. He continued to have lower extremity edema. BNP was again normal. Lower extremity dopplers were negative for DVT. He was given one dose of IV Lasix after his blood transfusion and then put back on home Lasix.   Patient presents today for follow-up. ***   CAD Patient has a long history of single vessel CAD involving the LAD s/p  multiple PCIs dating back to 2001. Last PCI was in angioplasty for in-stent restenosis of LAD in 2015.  - No chest pain.  - No antiplatelet therapy given need for full anticoagulation. - Continue statin.   Chronic HFrEF Lower Extremity Edema Initial diagnosed in 12/2012 in setting of anterior MI with EF of 35-40% at that time. EF later normalized to 55-60% on TTE in 102023. However, TEE during recent admission in 04/2022 showed new drop in EF to 20-25%. This was felt to likely be tachy-mediated in nature. He has also had some significant lower extremity edema since that admission. BNP has been normal and dopplers were negative for DVT.  - *** - Continue Lasix to 40mg  once daily.  - Continue Entresto 24-26mg  twice daily.  - Continue Spironolactone 12.5mg  daily.  - Continue Jardiance 10mg  daily.  - No beta-blocker given history of long pauses on Metoprolol. - Discussed importance of daily weights and sodium/ fluid restrictions.  - Recommend compression stockings to help with his edema.  - Will repeat Echo to reassess LV function after restoration of sinus rhythm and optimization of GDMT. If no improvement in EF, will need to consider repeat ischemic evaluation. ***  Persistent Atrial Flutter Pauses  Initially diagnosed during recent admission and underwent successful TEE/DCCV on 05/10/2022. He was noted to be back in atrial flutter when he was readmitted on 05/20/2022 for altered mental status. His beta-blocker was increased but then was noted to have increasingly longer pauses (4 second pause followed immediately by a 5.8 second pause). Therefore, beta-blocker was stopped and he was started on Amiodarone and then underwent repeat TEE/DCCV 05/28/2022 with restoration of sinus rhythm and no recurrent pauses. - Maintaining sinus rhythm on exam.  - Continue Amiodarone 200mg  daily. - Continue anticoagulation with Xarelto on 20mg  daily. - Follow-up in the [facility] as directed.   Hypertension BP  well controlled.  - Continue medications for CHF as above.   Hyperlipidemia Lipid panel in 04/2022: Total Cholesterol 139, triglycerides 182, HDL 27, LDL 76. LDL goal <55 given multiple MIs.  - Crestor was increased to 20mg  daily during admission in 04/2022. Continue. - Will repeat lipid panel and LFTs. ***   CKD Stage III Baseline creatinine around 1.3 to 1.8. Stable at 1.65 on most recent labs on 6/3.   Hypokalemia Potassium was mildly low at 3.3 on 6/3.  - Will repeat BMET today. ***   Past Medical History:  Diagnosis Date   Acute myocardial infarction of other anterior wall, initial episode of care 12/29/2012   Promus stent to LAD   Acute ST segment elevation MI (HCC) 01/05/2013   secondary to thrombus in stent; Brilintta changed to Effient pt with ASA allergy   Coronary artery disease 2001   stent to LAD and patent 2007 on cath   Echocardiogram abnormal 01/06/2013   EF 45-50%, grade I diastolic dysfunction   Erectile dysfunction    Hyperlipidemia LDL goal <70    Hypertension    OSA (obstructive sleep apnea)    No CPAP use overnight    Past Surgical History:  Procedure Laterality Date   BACK SURGERY  2007   Decompression lumbar laminectomy and micro discectomy, L4-5   BRONCHIAL BIOPSY  12/28/2021   Procedure: BRONCHIAL BIOPSIES;  Surgeon: Leslye Peer, MD;  Location: [facility] ENDOSCOPY;  Service: Pulmonary;;   BRONCHIAL BRUSHINGS  12/28/2021   Procedure: BRONCHIAL BRUSHINGS;  Surgeon: Leslye Peer, MD;  Location[facility] ENDOSCOPY;  Service: Pulmonary;;   BRONCHIAL NEEDLE ASPIRATION BIOPSY  12/28/2021   Procedure: BRONCHIAL NEEDLE ASPIRATION BIOPSIES;  Surgeon: Leslye Peer, MD;  Location: [facility] ENDOSCOPY;  Service: Pulmonary;;   BRONCHIAL WASHINGS  12/28/2021   Procedure: BRONCHIAL WASHINGS;  Surgeon: Leslye Peer, MD;  Location: [facility] ENDOSCOPY;  Service: Pulmonary;;   CARDIAC CATHETERIZATION  2007   patent stent to LAD and normal Cors   CARDIOVERSION N/A 05/10/2022    Procedure: CARDIOVERSION;  Surgeon: Sande Rives, MD;  Location: [facility] ENDOSCOPY;  Service: Cardiovascular;  Laterality: N/A;   CARDIOVERSION N/A 05/28/2022   Procedure: CARDIOVERSION;  Surgeon: Wendall Stade, MD;  Location: [facility] ENDOSCOPY;  Service: Cardiovascular;  Laterality: N/A;   CHOLECYSTECTOMY N/A 12/26/2021   Procedure: LAPAROSCOPIC CHOLECYSTECTOMY, LAPAROSCOPIC LYSIS OF ADHESIONS GREATER THAN 1 HOUR;  Surgeon: [name], MD;  Location: [facility] OR;  Service: General;  Laterality: N/A;   CORONARY ANGIOPLASTY WITH STENT PLACEMENT  2001   to LAD   CORONARY ANGIOPLASTY WITH STENT PLACEMENT  12/29/12   STEMI with promus DES to LAD   CORONARY ANGIOPLASTY WITH STENT PLACEMENT  01/05/13   STEMI with thrombosis in stent to LAD   FIDUCIAL MARKER PLACEMENT  12/28/2021   Procedure: FIDUCIAL MARKER PLACEMENT;  Surgeon: Delton Coombes,  Les Pou, MD;  Location: [facility] ENDOSCOPY;  Service: Pulmonary;;   LEFT HEART CATH N/A 12/29/2012   Procedure: LEFT HEART CATH;  Surgeon: Corky Crafts, MD;  Location: [facility] CATH LAB;  Service: Cardiovascular;  Laterality: N/A;   LEFT HEART CATHETERIZATION WITH CORONARY ANGIOGRAM N/A 01/05/2013   Procedure: LEFT HEART CATHETERIZATION WITH CORONARY ANGIOGRAM;  Surgeon: Micheline Chapman, MD;  Location: [facility] CATH LAB;  Service: Cardiovascular;  Laterality: N/A;   LEFT HEART CATHETERIZATION WITH CORONARY ANGIOGRAM N/A 04/22/2013   Procedure: LEFT HEART CATHETERIZATION WITH CORONARY ANGIOGRAM;  Surgeon: Lennette Bihari, MD;  Location: [facility] CATH LAB;  Service: Cardiovascular;  Laterality: N/A;   PARTIAL NEPHRECTOMY Left 1975   PATIENT ONLY HAS ONE KIDNEY   PERCUTANEOUS CORONARY STENT INTERVENTION (PCI-S)  12/29/2012   Procedure: PERCUTANEOUS CORONARY STENT INTERVENTION (PCI-S);  Surgeon: Corky Crafts, MD;  Location: [facility] CATH LAB;  Service: Cardiovascular;;   TEE WITHOUT CARDIOVERSION N/A 05/10/2022   Procedure: TRANSESOPHAGEAL ECHOCARDIOGRAM (TEE);  Surgeon: Sande Rives,  MD;  Location: [facility] ENDOSCOPY;  Service: Cardiovascular;  Laterality: N/A;   TEE WITHOUT CARDIOVERSION N/A 05/28/2022   Procedure: TRANSESOPHAGEAL ECHOCARDIOGRAM (TEE);  Surgeon: Wendall Stade, MD;  Location: [facility] ENDOSCOPY;  Service: Cardiovascular;  Laterality: N/A;    Current Medications: No outpatient medications have been marked as taking for the 08/07/22 encounter (Appointment) with Corrin Parker, PA-C.     Allergies:   Levitra [vardenafil], Aspirin, and Lipitor [atorvastatin]   Social History   Socioeconomic History   Marital status: Divorced    Spouse name: Not on file   Number of children: Not on file   Years of education: Not on file   Highest education level: Not on file  Occupational History   Not on file  Tobacco Use   Smoking status: Former    Packs/day: 2.00    Years: 25.00    Additional pack years: 0.00    Total pack years: 50.00    Types: Cigarettes    Quit date: 2001    Years since quitting: 23.4   Smokeless tobacco: Current    Types: Chew   Tobacco comments:    Currently Dip Tobacco     Former smoker 06/14/22  Vaping Use   Vaping Use: Never used  Substance and Sexual Activity   Alcohol use: Not Currently    Comment: 2001 stopped drinking   Drug use: Yes    Types: Marijuana    Comment: 1-2 aweek 06/13/25   Sexual activity: Not on file  Other Topics Concern   Not on file  Social History Narrative   Not on file   Social Determinants of Health   Financial Resource Strain: Not on file  Food Insecurity: No Food Insecurity (05/21/2022)   Hunger Vital Sign    Worried About Running Out of Food in the Last Year: Never true    Ran Out of Food in the Last Year: Never true  Transportation Needs: No Transportation Needs (05/21/2022)   PRAPARE - Administrator, Civil Service (Medical): No    Lack of Transportation (Non-Medical): No  Physical Activity: Not on file  Stress: Not on file  Social Connections: Not on file     Family  History: The patient's family history includes Alzheimer's disease in his mother; CAD in his father.  ROS:   Please see the history of present illness.     EKGs/Labs/Other Studies Reviewed:    The following studies were reviewed:  TTE 12/26/2021: Impressions:  1. Left ventricular ejection fraction, by estimation, is 55 to 60%. The  left ventricle has normal function. The left ventricle demonstrates  regional wall motion abnormalities (suggestive of prior anteroseptal/LAD  infarct). The left ventricular internal  cavity size was mildly dilated. Left ventricular diastolic parameters are  consistent with Grade I diastolic dysfunction (impaired relaxation).   2. Right ventricular systolic function is normal. The right ventricular  size is normal. Tricuspid regurgitation signal is inadequate for assessing  PA pressure.   3. The mitral valve is grossly normal. No evidence of mitral valve  regurgitation. No evidence of mitral stenosis.   4. The aortic valve is tricuspid. Aortic valve regurgitation is not  visualized. No aortic stenosis is present.   Comparison(s): LVEF has improve from prior reporting.  _______________   TEE 05/10/2022: Impressions: 1. Successful TEE/DCCV with return to NSR. No LAA thrombus. LVEF 20-25%.  Global HK.   2. Left ventricular ejection fraction, by estimation, is 20 to 25%. The  left ventricle has severely decreased function.   3. Right ventricular systolic function is mildly reduced. The right  ventricular size is normal.   4. Left atrial size was moderately dilated. No left atrial/left atrial  appendage thrombus was detected. The LAA emptying velocity was 39 cm/s.   5. Right atrial size was mildly dilated.   6. The mitral valve is grossly normal. Mild mitral valve regurgitation.  No evidence of mitral stenosis.   7. The aortic valve is tricuspid. Aortic valve regurgitation is not  visualized. No aortic stenosis is present.   8. There is Moderate (Grade  III) layered plaque involving the descending  aorta.    _______________   TEE 05/28/2022: Impressions: 1. No LAA thrombus DCC x 1 with 150 J biphasic converted to NSR.   2. Left ventricular ejection fraction, by estimation, is 25 to 30%. The  left ventricle has severely decreased function. The left ventricle  demonstrates global hypokinesis. The left ventricular internal cavity size  was moderately dilated.   3. Right ventricular systolic function is normal. The right ventricular  size is normal.   4. Left atrial size was moderately dilated. No left atrial/left atrial  appendage thrombus was detected.   5. The mitral valve is normal in structure. Mild mitral valve  regurgitation. No evidence of mitral stenosis.   6. The aortic valve is tricuspid. Aortic valve regurgitation is not  visualized. No aortic stenosis is present.   7. The inferior vena cava is normal in size with greater than 50%  respiratory variability, suggesting right atrial pressure of 3 mmHg.   Conclusion(s)/Recommendation(s): Normal biventricular function without  evidence of hemodynamically significant valvular heart disease. _______________  Lower Extremity Venous Dopplers 07/30/2022: Summary:  RIGHT:  - There is no evidence of deep vein thrombosis in the lower extremity.  - No cystic structure found in the popliteal fossa.    LEFT:  - No evidence of common femoral vein obstruction.     EKG:  EKG not ordered today.   Recent Labs: 05/06/2022: TSH 1.856 05/23/2022: Magnesium 2.1 07/30/2022: ALT 17; B Natriuretic Peptide 95.5; BUN 16; Creatinine, Ser 1.65; Hemoglobin 7.7; Platelets 172; Potassium 3.3; Sodium 142  Recent Lipid Panel    Component Value Date/Time   CHOL 139 05/24/2022 0051   TRIG 182 (H) 05/24/2022 0051   HDL 27 (L) 05/24/2022 0051   CHOLHDL 5.1 05/24/2022 0051   VLDL 36 05/24/2022 0051   LDLCALC 76 05/24/2022 0051    Physical Exam:  Vital Signs: There were no vitals taken for this visit.     Wt Readings from Last 3 Encounters:  07/30/22 224 lb (101.6 kg)  06/18/22 215 lb (97.5 kg)  06/14/22 216 lb 3.2 oz (98.1 kg)     General: 72 y.o. male in no acute distress. HEENT: Normocephalic and atraumatic. Sclera clear. EOMs intact. Neck: Supple. No carotid bruits. No JVD. Heart: *** RRR. Distinct S1 and S2. No murmurs, gallops, or rubs. Radial and distal pedal pulses 2+ and equal bilaterally. Lungs: No increased work of breathing. Clear to ausculation bilaterally. No wheezes, rhonchi, or rales.  Abdomen: Soft, non-distended, and non-tender to palpation. Bowel sounds present in all 4 quadrants.  MSK: Normal strength and tone for age. *** Extremities: No lower extremity edema.    Skin: Warm and dry. Neuro: Alert and oriented x3. No focal deficits. Psych: Normal affect. Responds appropriately.   Assessment:    No diagnosis found.  Plan:     Disposition: Follow up in ***   Medication Adjustments/Labs and Tests Ordered: Current medicines are reviewed at length with the patient today.  Concerns regarding medicines are outlined above.  No orders of the defined types were placed in this encounter.  No orders of the defined types were placed in this encounter.   There are no Patient Instructions on file for this visit.   Signed, Corrin Parker, PA-C  08/04/2022 9:56 AM    [facility]

## 2022-08-07 ENCOUNTER — Ambulatory Visit: Payer: Medicare HMO | Attending: Student | Admitting: Student

## 2022-08-08 DIAGNOSIS — I4819 Other persistent atrial fibrillation: Secondary | ICD-10-CM | POA: Diagnosis not present

## 2022-08-08 DIAGNOSIS — I2510 Atherosclerotic heart disease of native coronary artery without angina pectoris: Secondary | ICD-10-CM | POA: Diagnosis not present

## 2022-08-08 DIAGNOSIS — I130 Hypertensive heart and chronic kidney disease with heart failure and stage 1 through stage 4 chronic kidney disease, or unspecified chronic kidney disease: Secondary | ICD-10-CM | POA: Diagnosis not present

## 2022-08-08 DIAGNOSIS — S2241XD Multiple fractures of ribs, right side, subsequent encounter for fracture with routine healing: Secondary | ICD-10-CM | POA: Diagnosis not present

## 2022-08-08 DIAGNOSIS — I255 Ischemic cardiomyopathy: Secondary | ICD-10-CM | POA: Diagnosis not present

## 2022-08-08 DIAGNOSIS — I509 Heart failure, unspecified: Secondary | ICD-10-CM | POA: Diagnosis not present

## 2022-08-08 DIAGNOSIS — N1831 Chronic kidney disease, stage 3a: Secondary | ICD-10-CM | POA: Diagnosis not present

## 2022-08-08 DIAGNOSIS — I252 Old myocardial infarction: Secondary | ICD-10-CM | POA: Diagnosis not present

## 2022-08-08 DIAGNOSIS — I4892 Unspecified atrial flutter: Secondary | ICD-10-CM | POA: Diagnosis not present

## 2022-08-14 ENCOUNTER — Telehealth: Payer: Self-pay | Admitting: Student

## 2022-08-14 NOTE — Telephone Encounter (Signed)
Spoke with pt daughter, she reports the patient was recently in the hospital for a hgb of 6.7. since discharge, his right leg is very swollen with 3+ pitting edema. His left leg is not as bad. He is having SOB with any exertion, extreme fatigue and pain in his left thigh. She reports he just does not look good. He is also having some dizziness and she feels his heart failure is worse. He only has one kidney so with his kidney function elevated she is not sure adding extra lasix will make a difference. Explained to daughter, we did not see him during his recent inpatient episode. Encouraged her to take the patient back to the hospital for evaluation and treatment. Pt daughter agreed with this plan.

## 2022-08-14 NOTE — Telephone Encounter (Signed)
Pt c/o swelling: STAT is pt has developed SOB within 24 hours  If swelling, where is the swelling located?  Right leg and little in his left leg  How much weight have you gained and in what time span? 6 lbs  in 5 days  Have you gained 3 pounds in a day or 5 pounds in a week? 6lbs in 3 days  Do you have a log of your daily weights (if so, list)?   Are you currently taking a fluid pill? yes  Are you currently SOB?  Only on exertion  Have you traveled recently? No- daughter wants patient seen today or  maybe admitted  to the hospital. She will explain why, when she talks to you

## 2022-08-15 ENCOUNTER — Telehealth (HOSPITAL_COMMUNITY): Payer: Self-pay | Admitting: Student

## 2022-08-15 DIAGNOSIS — S2241XD Multiple fractures of ribs, right side, subsequent encounter for fracture with routine healing: Secondary | ICD-10-CM | POA: Diagnosis not present

## 2022-08-15 DIAGNOSIS — N1831 Chronic kidney disease, stage 3a: Secondary | ICD-10-CM | POA: Diagnosis not present

## 2022-08-15 DIAGNOSIS — I4892 Unspecified atrial flutter: Secondary | ICD-10-CM | POA: Diagnosis not present

## 2022-08-15 DIAGNOSIS — I252 Old myocardial infarction: Secondary | ICD-10-CM | POA: Diagnosis not present

## 2022-08-15 DIAGNOSIS — I2510 Atherosclerotic heart disease of native coronary artery without angina pectoris: Secondary | ICD-10-CM | POA: Diagnosis not present

## 2022-08-15 DIAGNOSIS — I255 Ischemic cardiomyopathy: Secondary | ICD-10-CM | POA: Diagnosis not present

## 2022-08-15 DIAGNOSIS — I509 Heart failure, unspecified: Secondary | ICD-10-CM | POA: Diagnosis not present

## 2022-08-15 DIAGNOSIS — I130 Hypertensive heart and chronic kidney disease with heart failure and stage 1 through stage 4 chronic kidney disease, or unspecified chronic kidney disease: Secondary | ICD-10-CM | POA: Diagnosis not present

## 2022-08-15 DIAGNOSIS — I4819 Other persistent atrial fibrillation: Secondary | ICD-10-CM | POA: Diagnosis not present

## 2022-08-15 NOTE — Telephone Encounter (Signed)
Just an FYI. We have made several attempts to contact this patient to reschedule their echocardiogram. We will be removing the patient from the echo WQ.   08/15/22 [facility] to reschedule x 2 @ 10:48/LBW  08/03/22 [facility] to reschedule @ 1:10/LBW  08/01/22 Called to reschedule and LVM to call office @ 9:16/LBW    07/30/2022 12:02 PM KG:[email or web address], BETHANY D  Cancel Rsn: Patient (Patient waited 10 minutes past appointment time and told front desk he was leaving. )        Thank you

## 2022-08-17 ENCOUNTER — Other Ambulatory Visit: Payer: Self-pay

## 2022-08-17 ENCOUNTER — Emergency Department (HOSPITAL_COMMUNITY): Payer: Medicare HMO

## 2022-08-17 ENCOUNTER — Encounter (HOSPITAL_COMMUNITY): Payer: Self-pay

## 2022-08-17 ENCOUNTER — Observation Stay (HOSPITAL_COMMUNITY): Payer: Medicare HMO

## 2022-08-17 ENCOUNTER — Emergency Department (HOSPITAL_BASED_OUTPATIENT_CLINIC_OR_DEPARTMENT_OTHER)
Admit: 2022-08-17 | Discharge: 2022-08-17 | Disposition: A | Discharge status: Home / Self Care | Payer: Medicare HMO | Attending: Emergency Medicine | Admitting: Emergency Medicine

## 2022-08-17 ENCOUNTER — Inpatient Hospital Stay (HOSPITAL_COMMUNITY)
Admission: EM | Admit: 2022-08-17 | Discharge: 2022-08-28 | DRG: 329 | Disposition: A | Discharge status: Home Health | Payer: Medicare HMO | Attending: Internal Medicine | Admitting: Internal Medicine

## 2022-08-17 DIAGNOSIS — Z7901 Long term (current) use of anticoagulants: Secondary | ICD-10-CM

## 2022-08-17 DIAGNOSIS — Z888 Allergy status to other drugs, medicaments and biological substances status: Secondary | ICD-10-CM

## 2022-08-17 DIAGNOSIS — F17220 Nicotine dependence, chewing tobacco, uncomplicated: Secondary | ICD-10-CM | POA: Diagnosis present

## 2022-08-17 DIAGNOSIS — K5710 Diverticulosis of small intestine without perforation or abscess without bleeding: Secondary | ICD-10-CM | POA: Diagnosis not present

## 2022-08-17 DIAGNOSIS — F1290 Cannabis use, unspecified, uncomplicated: Secondary | ICD-10-CM | POA: Diagnosis present

## 2022-08-17 DIAGNOSIS — I70229 Atherosclerosis of native arteries of extremities with rest pain, unspecified extremity: Secondary | ICD-10-CM

## 2022-08-17 DIAGNOSIS — N179 Acute kidney failure, unspecified: Secondary | ICD-10-CM | POA: Diagnosis present

## 2022-08-17 DIAGNOSIS — Z886 Allergy status to analgesic agent status: Secondary | ICD-10-CM

## 2022-08-17 DIAGNOSIS — R238 Other skin changes: Secondary | ICD-10-CM | POA: Diagnosis not present

## 2022-08-17 DIAGNOSIS — E669 Obesity, unspecified: Secondary | ICD-10-CM | POA: Diagnosis present

## 2022-08-17 DIAGNOSIS — Z91148 Patient's other noncompliance with medication regimen for other reason: Secondary | ICD-10-CM

## 2022-08-17 DIAGNOSIS — E8809 Other disorders of plasma-protein metabolism, not elsewhere classified: Secondary | ICD-10-CM | POA: Diagnosis not present

## 2022-08-17 DIAGNOSIS — I5022 Chronic systolic (congestive) heart failure: Secondary | ICD-10-CM

## 2022-08-17 DIAGNOSIS — K2970 Gastritis, unspecified, without bleeding: Secondary | ICD-10-CM | POA: Diagnosis not present

## 2022-08-17 DIAGNOSIS — M7989 Other specified soft tissue disorders: Secondary | ICD-10-CM

## 2022-08-17 DIAGNOSIS — I428 Other cardiomyopathies: Secondary | ICD-10-CM | POA: Diagnosis present

## 2022-08-17 DIAGNOSIS — F418 Other specified anxiety disorders: Secondary | ICD-10-CM | POA: Diagnosis not present

## 2022-08-17 DIAGNOSIS — I5032 Chronic diastolic (congestive) heart failure: Secondary | ICD-10-CM | POA: Diagnosis not present

## 2022-08-17 DIAGNOSIS — I160 Hypertensive urgency: Secondary | ICD-10-CM | POA: Diagnosis not present

## 2022-08-17 DIAGNOSIS — R911 Solitary pulmonary nodule: Secondary | ICD-10-CM | POA: Diagnosis present

## 2022-08-17 DIAGNOSIS — J479 Bronchiectasis, uncomplicated: Secondary | ICD-10-CM | POA: Diagnosis present

## 2022-08-17 DIAGNOSIS — Z955 Presence of coronary angioplasty implant and graft: Secondary | ICD-10-CM

## 2022-08-17 DIAGNOSIS — D631 Anemia in chronic kidney disease: Secondary | ICD-10-CM | POA: Diagnosis not present

## 2022-08-17 DIAGNOSIS — Z820 Family history of epilepsy and other diseases of the nervous system: Secondary | ICD-10-CM

## 2022-08-17 DIAGNOSIS — R112 Nausea with vomiting, unspecified: Secondary | ICD-10-CM | POA: Diagnosis not present

## 2022-08-17 DIAGNOSIS — I10 Essential (primary) hypertension: Secondary | ICD-10-CM | POA: Diagnosis not present

## 2022-08-17 DIAGNOSIS — N289 Disorder of kidney and ureter, unspecified: Secondary | ICD-10-CM | POA: Diagnosis not present

## 2022-08-17 DIAGNOSIS — D509 Iron deficiency anemia, unspecified: Secondary | ICD-10-CM | POA: Diagnosis not present

## 2022-08-17 DIAGNOSIS — G4733 Obstructive sleep apnea (adult) (pediatric): Secondary | ICD-10-CM | POA: Diagnosis not present

## 2022-08-17 DIAGNOSIS — R0609 Other forms of dyspnea: Secondary | ICD-10-CM

## 2022-08-17 DIAGNOSIS — K5750 Diverticulosis of both small and large intestine without perforation or abscess without bleeding: Secondary | ICD-10-CM | POA: Diagnosis present

## 2022-08-17 DIAGNOSIS — K449 Diaphragmatic hernia without obstruction or gangrene: Secondary | ICD-10-CM | POA: Diagnosis not present

## 2022-08-17 DIAGNOSIS — Z87891 Personal history of nicotine dependence: Secondary | ICD-10-CM | POA: Diagnosis not present

## 2022-08-17 DIAGNOSIS — R197 Diarrhea, unspecified: Secondary | ICD-10-CM | POA: Diagnosis not present

## 2022-08-17 DIAGNOSIS — I4892 Unspecified atrial flutter: Secondary | ICD-10-CM | POA: Diagnosis present

## 2022-08-17 DIAGNOSIS — I129 Hypertensive chronic kidney disease with stage 1 through stage 4 chronic kidney disease, or unspecified chronic kidney disease: Secondary | ICD-10-CM | POA: Diagnosis not present

## 2022-08-17 DIAGNOSIS — D124 Benign neoplasm of descending colon: Secondary | ICD-10-CM | POA: Diagnosis present

## 2022-08-17 DIAGNOSIS — S2241XD Multiple fractures of ribs, right side, subsequent encounter for fracture with routine healing: Secondary | ICD-10-CM | POA: Diagnosis not present

## 2022-08-17 DIAGNOSIS — D500 Iron deficiency anemia secondary to blood loss (chronic): Secondary | ICD-10-CM | POA: Diagnosis present

## 2022-08-17 DIAGNOSIS — F330 Major depressive disorder, recurrent, mild: Secondary | ICD-10-CM | POA: Diagnosis present

## 2022-08-17 DIAGNOSIS — I480 Paroxysmal atrial fibrillation: Secondary | ICD-10-CM | POA: Diagnosis not present

## 2022-08-17 DIAGNOSIS — R0602 Shortness of breath: Secondary | ICD-10-CM | POA: Diagnosis not present

## 2022-08-17 DIAGNOSIS — E876 Hypokalemia: Secondary | ICD-10-CM | POA: Diagnosis present

## 2022-08-17 DIAGNOSIS — D649 Anemia, unspecified: Secondary | ICD-10-CM

## 2022-08-17 DIAGNOSIS — R195 Other fecal abnormalities: Secondary | ICD-10-CM | POA: Diagnosis not present

## 2022-08-17 DIAGNOSIS — I252 Old myocardial infarction: Secondary | ICD-10-CM

## 2022-08-17 DIAGNOSIS — Z7984 Long term (current) use of oral hypoglycemic drugs: Secondary | ICD-10-CM

## 2022-08-17 DIAGNOSIS — Z8249 Family history of ischemic heart disease and other diseases of the circulatory system: Secondary | ICD-10-CM

## 2022-08-17 DIAGNOSIS — I4819 Other persistent atrial fibrillation: Secondary | ICD-10-CM | POA: Insufficient documentation

## 2022-08-17 DIAGNOSIS — Z6829 Body mass index (BMI) 29.0-29.9, adult: Secondary | ICD-10-CM

## 2022-08-17 DIAGNOSIS — Z79899 Other long term (current) drug therapy: Secondary | ICD-10-CM

## 2022-08-17 DIAGNOSIS — K648 Other hemorrhoids: Secondary | ICD-10-CM | POA: Diagnosis present

## 2022-08-17 DIAGNOSIS — R109 Unspecified abdominal pain: Secondary | ICD-10-CM | POA: Diagnosis not present

## 2022-08-17 DIAGNOSIS — R0600 Dyspnea, unspecified: Principal | ICD-10-CM

## 2022-08-17 DIAGNOSIS — C183 Malignant neoplasm of hepatic flexure: Principal | ICD-10-CM | POA: Diagnosis present

## 2022-08-17 DIAGNOSIS — R001 Bradycardia, unspecified: Secondary | ICD-10-CM | POA: Diagnosis present

## 2022-08-17 DIAGNOSIS — R140 Abdominal distension (gaseous): Secondary | ICD-10-CM | POA: Diagnosis present

## 2022-08-17 DIAGNOSIS — I5033 Acute on chronic diastolic (congestive) heart failure: Secondary | ICD-10-CM | POA: Diagnosis present

## 2022-08-17 DIAGNOSIS — I2510 Atherosclerotic heart disease of native coronary artery without angina pectoris: Secondary | ICD-10-CM | POA: Diagnosis not present

## 2022-08-17 DIAGNOSIS — K660 Peritoneal adhesions (postprocedural) (postinfection): Secondary | ICD-10-CM | POA: Diagnosis present

## 2022-08-17 DIAGNOSIS — Z905 Acquired absence of kidney: Secondary | ICD-10-CM

## 2022-08-17 DIAGNOSIS — N1831 Chronic kidney disease, stage 3a: Secondary | ICD-10-CM | POA: Diagnosis present

## 2022-08-17 DIAGNOSIS — R1110 Vomiting, unspecified: Secondary | ICD-10-CM | POA: Diagnosis not present

## 2022-08-17 DIAGNOSIS — D125 Benign neoplasm of sigmoid colon: Secondary | ICD-10-CM | POA: Diagnosis present

## 2022-08-17 DIAGNOSIS — I5023 Acute on chronic systolic (congestive) heart failure: Secondary | ICD-10-CM | POA: Diagnosis not present

## 2022-08-17 DIAGNOSIS — K5730 Diverticulosis of large intestine without perforation or abscess without bleeding: Secondary | ICD-10-CM | POA: Diagnosis not present

## 2022-08-17 DIAGNOSIS — I130 Hypertensive heart and chronic kidney disease with heart failure and stage 1 through stage 4 chronic kidney disease, or unspecified chronic kidney disease: Secondary | ICD-10-CM | POA: Diagnosis present

## 2022-08-17 DIAGNOSIS — N189 Chronic kidney disease, unspecified: Secondary | ICD-10-CM | POA: Diagnosis not present

## 2022-08-17 DIAGNOSIS — K635 Polyp of colon: Secondary | ICD-10-CM | POA: Diagnosis not present

## 2022-08-17 DIAGNOSIS — N1832 Chronic kidney disease, stage 3b: Secondary | ICD-10-CM | POA: Diagnosis not present

## 2022-08-17 DIAGNOSIS — Z5331 Laparoscopic surgical procedure converted to open procedure: Secondary | ICD-10-CM

## 2022-08-17 DIAGNOSIS — E785 Hyperlipidemia, unspecified: Secondary | ICD-10-CM | POA: Diagnosis present

## 2022-08-17 DIAGNOSIS — M79605 Pain in left leg: Secondary | ICD-10-CM | POA: Diagnosis present

## 2022-08-17 DIAGNOSIS — I517 Cardiomegaly: Secondary | ICD-10-CM | POA: Diagnosis not present

## 2022-08-17 DIAGNOSIS — C189 Malignant neoplasm of colon, unspecified: Secondary | ICD-10-CM | POA: Diagnosis not present

## 2022-08-17 DIAGNOSIS — D630 Anemia in neoplastic disease: Secondary | ICD-10-CM | POA: Diagnosis not present

## 2022-08-17 HISTORY — DX: Chronic kidney disease, stage 3 unspecified: N18.30

## 2022-08-17 HISTORY — DX: Other nonspecific abnormal finding of lung field: R91.8

## 2022-08-17 HISTORY — DX: Bradycardia, unspecified: R00.1

## 2022-08-17 HISTORY — DX: Atrial premature depolarization: I49.1

## 2022-08-17 HISTORY — DX: Atherosclerotic heart disease of native coronary artery without angina pectoris: I25.10

## 2022-08-17 HISTORY — DX: Chronic systolic (congestive) heart failure: I50.22

## 2022-08-17 HISTORY — DX: Unspecified atrial flutter: I48.92

## 2022-08-17 HISTORY — DX: Paroxysmal atrial fibrillation: I48.0

## 2022-08-17 HISTORY — DX: Other cerebral infarction due to occlusion or stenosis of small artery: I63.81

## 2022-08-17 LAB — BASIC METABOLIC PANEL
Anion gap: 9 (ref 5–15)
BUN: 23 mg/dL (ref 8–23)
CO2: 25 mmol/L (ref 22–32)
Calcium: 8.3 mg/dL — ABNORMAL LOW (ref 8.9–10.3)
Chloride: 104 mmol/L (ref 98–111)
Creatinine, Ser: 1.99 mg/dL — ABNORMAL HIGH (ref 0.61–1.24)
GFR, Estimated: 36 mL/min — ABNORMAL LOW (ref >60)
Glucose, Bld: 102 mg/dL — ABNORMAL HIGH (ref 70–99)
Potassium: 3.8 mmol/L (ref 3.5–5.1)
Sodium: 138 mmol/L (ref 135–145)

## 2022-08-17 LAB — HEPATIC FUNCTION PANEL
ALT: 15 U/L (ref 0–44)
AST: 16 U/L (ref 15–41)
Albumin: 3.6 g/dL (ref 3.5–5.0)
Alkaline Phosphatase: 73 U/L (ref 38–126)
Bilirubin, Direct: <0.1 mg/dL (ref 0.0–0.2)
Total Bilirubin: 0.7 mg/dL (ref 0.3–1.2)
Total Protein: 6.6 g/dL (ref 6.5–8.1)

## 2022-08-17 LAB — CBC
HCT: 24.3 % — ABNORMAL LOW (ref 39.0–52.0)
Hemoglobin: 7.3 g/dL — ABNORMAL LOW (ref 13.0–17.0)
MCH: 25.7 pg — ABNORMAL LOW (ref 26.0–34.0)
MCHC: 30 g/dL (ref 30.0–36.0)
MCV: 85.6 fL (ref 80.0–100.0)
Platelets: 223 10*3/uL (ref 150–400)
RBC: 2.84 MIL/uL — ABNORMAL LOW (ref 4.22–5.81)
RDW: 17.2 % — ABNORMAL HIGH (ref 11.5–15.5)
WBC: 9.5 10*3/uL (ref 4.0–10.5)
nRBC: 0 % (ref 0.0–0.2)

## 2022-08-17 LAB — VAS US ABI WITH/WO TBI
Left ABI: 1.03
Right ABI: 1.24

## 2022-08-17 LAB — TYPE AND SCREEN
Antibody Screen: NEGATIVE
Unit division: 0

## 2022-08-17 LAB — BPAM RBC
ISSUE DATE / TIME: 202406211737
Unit Type and Rh: 5100

## 2022-08-17 LAB — IRON AND TIBC
Iron: 12 ug/dL — ABNORMAL LOW (ref 45–182)
Saturation Ratios: 3 % — ABNORMAL LOW (ref 17.9–39.5)
TIBC: 420 ug/dL (ref 250–450)
UIBC: 408 ug/dL

## 2022-08-17 LAB — HEMOGLOBIN AND HEMATOCRIT, BLOOD
HCT: 26.4 % — ABNORMAL LOW (ref 39.0–52.0)
Hemoglobin: 7.8 g/dL — ABNORMAL LOW (ref 13.0–17.0)

## 2022-08-17 LAB — TROPONIN I (HIGH SENSITIVITY)
Troponin I (High Sensitivity): 5 ng/L (ref <18)
Troponin I (High Sensitivity): 5 ng/L (ref <18)

## 2022-08-17 LAB — MAGNESIUM: Magnesium: 2.4 mg/dL (ref 1.7–2.4)

## 2022-08-17 LAB — TSH: TSH: 8.374 u[IU]/mL — ABNORMAL HIGH (ref 0.350–4.500)

## 2022-08-17 LAB — D-DIMER, QUANTITATIVE: D-Dimer, Quant: 0.3 ug/mL-FEU (ref 0.00–0.50)

## 2022-08-17 LAB — PREPARE RBC (CROSSMATCH)

## 2022-08-17 LAB — BRAIN NATRIURETIC PEPTIDE: B Natriuretic Peptide: 26.9 pg/mL (ref 0.0–100.0)

## 2022-08-17 LAB — FERRITIN: Ferritin: 17 ng/mL — ABNORMAL LOW (ref 24–336)

## 2022-08-17 MED ORDER — POLYETHYLENE GLYCOL 3350 17 G PO PACK
17.0000 g | PACK | Freq: Every day | ORAL | Status: DC | PRN
  Administered 2022-08-22: 17 g via ORAL
  Filled 2022-08-17 (×2): qty 1

## 2022-08-17 MED ORDER — PAROXETINE HCL 30 MG PO TABS
30.0000 mg | ORAL_TABLET | Freq: Every day | ORAL | Status: DC
  Administered 2022-08-18 – 2022-08-28 (×11): 30 mg via ORAL
  Filled 2022-08-17 (×11): qty 1

## 2022-08-17 MED ORDER — ORAL CARE MOUTH RINSE: 15.0000 mL | OROMUCOSAL | Status: DC | PRN

## 2022-08-17 MED ORDER — PREGABALIN 25 MG PO CAPS
150.0000 mg | ORAL_CAPSULE | Freq: Three times a day (TID) | ORAL | Status: DC
  Administered 2022-08-17 – 2022-08-28 (×31): 150 mg via ORAL
  Filled 2022-08-17 (×33): qty 2

## 2022-08-17 MED ORDER — PANTOPRAZOLE SODIUM 40 MG PO TBEC
40.0000 mg | DELAYED_RELEASE_TABLET | Freq: Every day | ORAL | Status: DC
  Administered 2022-08-18 – 2022-08-28 (×11): 40 mg via ORAL
  Filled 2022-08-17 (×11): qty 1

## 2022-08-17 MED ORDER — EMPAGLIFLOZIN 10 MG PO TABS
10.0000 mg | ORAL_TABLET | Freq: Every day | ORAL | Status: DC
  Administered 2022-08-18 – 2022-08-28 (×11): 10 mg via ORAL
  Filled 2022-08-17 (×11): qty 1

## 2022-08-17 MED ORDER — SODIUM CHLORIDE 0.9% FLUSH
3.0000 mL | Freq: Two times a day (BID) | INTRAVENOUS | Status: DC
  Administered 2022-08-17 – 2022-08-28 (×21): 3 mL via INTRAVENOUS

## 2022-08-17 MED ORDER — FUROSEMIDE 10 MG/ML IJ SOLN
20.0000 mg | Freq: Once | INTRAMUSCULAR | Status: AC
  Administered 2022-08-17: 20 mg via INTRAVENOUS
  Filled 2022-08-17: qty 2

## 2022-08-17 MED ORDER — QUETIAPINE FUMARATE 25 MG PO TABS
25.0000 mg | ORAL_TABLET | Freq: Every day | ORAL | Status: DC
  Administered 2022-08-17 – 2022-08-27 (×11): 25 mg via ORAL
  Filled 2022-08-17 (×11): qty 1

## 2022-08-17 MED ORDER — ACETAMINOPHEN 325 MG PO TABS: 650.0000 mg | ORAL_TABLET | Freq: Four times a day (QID) | ORAL | Status: DC | PRN

## 2022-08-17 MED ORDER — AMIODARONE HCL 200 MG PO TABS: 200.0000 mg | ORAL_TABLET | Freq: Every day | ORAL | Status: DC

## 2022-08-17 MED ORDER — ROSUVASTATIN CALCIUM 20 MG PO TABS
20.0000 mg | ORAL_TABLET | Freq: Every day | ORAL | Status: DC
  Administered 2022-08-17 – 2022-08-28 (×12): 20 mg via ORAL
  Filled 2022-08-17 (×12): qty 1

## 2022-08-17 MED ORDER — SODIUM CHLORIDE 0.9% IV SOLUTION: Freq: Once | INTRAVENOUS | Status: DC

## 2022-08-17 MED ORDER — ACETAMINOPHEN 650 MG RE SUPP: 650.0000 mg | Freq: Four times a day (QID) | RECTAL | Status: DC | PRN

## 2022-08-17 MED ORDER — SACUBITRIL-VALSARTAN 24-26 MG PO TABS
1.0000 | ORAL_TABLET | Freq: Two times a day (BID) | ORAL | Status: DC
  Administered 2022-08-17: 1 via ORAL
  Filled 2022-08-17 (×2): qty 1

## 2022-08-17 MED ORDER — RIVAROXABAN 10 MG PO TABS: 20.0000 mg | ORAL_TABLET | Freq: Every day | ORAL | Status: DC

## 2022-08-17 MED ORDER — BUPROPION HCL ER (XL) 150 MG PO TB24
150.0000 mg | ORAL_TABLET | Freq: Every day | ORAL | Status: DC
  Administered 2022-08-18 – 2022-08-28 (×11): 150 mg via ORAL
  Filled 2022-08-17 (×11): qty 1

## 2022-08-17 MED ORDER — AMIODARONE HCL 100 MG PO TABS
100.0000 mg | ORAL_TABLET | Freq: Every day | ORAL | Status: DC
  Administered 2022-08-18 – 2022-08-28 (×11): 100 mg via ORAL
  Filled 2022-08-17 (×11): qty 1

## 2022-08-17 NOTE — ED Notes (Signed)
Patient transported to X-ray 

## 2022-08-17 NOTE — ED Notes (Signed)
ED TO INPATIENT HANDOFF REPORT  ED Nurse Name and Phone #: Marcelino Duster #[phone]  S Name/Age/Gender Brendan Butler 72 y.o. male Room/Bed: 004C/004C  Code Status   Code Status: Full Code  Home/SNF/Other Home Patient oriented to: self, place, time, and situation Is this baseline? Yes   Triage Complete: Triage complete  Chief Complaint Symptomatic anemia [D64.9]  Triage Note Pt arrived POV. Fluid retention. Family called cardiologist Tues/Wed and was told to come to ED. Unable to increase lasix at home d/t 1 kidney. Fatigue, shob, HH RN told family pt's BP has been soft and pt was dizzy. Currently every other daily spironolactone. Increased pain in L thigh which pt says feels like a pinched nerve. Pt abd distended and pt pale.   Allergies Allergies  Allergen Reactions   Levitra [Vardenafil] Other (See Comments)    Blurred vision   Aspirin Hives   Lipitor [Atorvastatin] Other (See Comments)    Muscle cramps; patient states he does not take medication at home    Level of Care/Admitting Diagnosis ED Disposition     ED Disposition  Admit   Condition  --   Comment  Hospital Area: [facility] [[id number]]  Level of Care: Progressive [102]  Admit to Progressive based on following criteria: CARDIOVASCULAR & THORACIC of moderate stability with acute coronary syndrome symptoms/low risk myocardial infarction/hypertensive urgency/arrhythmias/heart failure potentially compromising stability and stable post cardiovascular intervention patients.  May place patient in observation at [facility] or [facility] if equivalent level of care is available:: No  Covid Evaluation: Asymptomatic - no recent exposure (last 10 days) testing not required  Diagnosis: Symptomatic anemia [4540981]  Admitting Physician: Synetta Fail [[id number]]  Attending Physician: Synetta Fail [[id number]]          B Medical/Surgery History Past Medical History:  Diagnosis Date   Acute  blood loss anemia 07/30/2022   Acute cholecystitis 12/25/2021   Acute myocardial infarction of other anterior wall, initial episode of care 12/29/2012   Promus stent to LAD   Acute ST segment elevation MI (HCC) 01/05/2013   secondary to thrombus in stent; Brilintta changed to Effient pt with ASA allergy   Acute-on-chronic kidney injury (HCC) 01/17/2021   Altered mental status 05/06/2022   Atrial fibrillation with RVR (HCC) 05/20/2022   Atrial flutter with rapid ventricular response (HCC) 05/06/2022   Coronary artery disease 2001   stent to LAD and patent 2007 on cath   Echocardiogram abnormal 01/06/2013   EF 45-50%, grade I diastolic dysfunction   Erectile dysfunction    Hyperlipidemia LDL goal <70    Hypertension    OSA (obstructive sleep apnea)    No CPAP use overnight   Past Surgical History:  Procedure Laterality Date   BACK SURGERY  2007   Decompression lumbar laminectomy and micro discectomy, L4-5   BRONCHIAL BIOPSY  12/28/2021   Procedure: BRONCHIAL BIOPSIES;  Surgeon: Leslye Peer, MD;  Location: [facility] ENDOSCOPY;  Service: Pulmonary;;   BRONCHIAL BRUSHINGS  12/28/2021   Procedure: BRONCHIAL BRUSHINGS;  Surgeon: Leslye Peer, MD;  Location: [facility] ENDOSCOPY;  Service: Pulmonary;;   BRONCHIAL NEEDLE ASPIRATION BIOPSY  12/28/2021   Procedure: BRONCHIAL NEEDLE ASPIRATION BIOPSIES;  Surgeon: Leslye Peer, MD;  Location: [facility] ENDOSCOPY;  Service: Pulmonary;;   BRONCHIAL WASHINGS  12/28/2021   Procedure: BRONCHIAL WASHINGS;  Surgeon: Leslye Peer, MD;  Location: [facility] ENDOSCOPY;  Service: Pulmonary;;   CARDIAC CATHETERIZATION  2007   patent stent to LAD and normal Cors  CARDIOVERSION N/A 05/10/2022   Procedure: CARDIOVERSION;  Surgeon: Sande Rives, MD;  Location: [facility] ENDOSCOPY;  Service: Cardiovascular;  Laterality: N/A;   CARDIOVERSION N/A 05/28/2022   Procedure: CARDIOVERSION;  Surgeon: Wendall Stade, MD;  Location: [facility] ENDOSCOPY;  Service: Cardiovascular;  Laterality:  N/A;   CHOLECYSTECTOMY N/A 12/26/2021   Procedure: LAPAROSCOPIC CHOLECYSTECTOMY, LAPAROSCOPIC LYSIS OF ADHESIONS GREATER THAN 1 HOUR;  Surgeon: [name], MD;  Location: [facility] OR;  Service: General;  Laterality: N/A;   CORONARY ANGIOPLASTY WITH STENT PLACEMENT  2001   to LAD   CORONARY ANGIOPLASTY WITH STENT PLACEMENT  12/29/12   STEMI with promus DES to LAD   CORONARY ANGIOPLASTY WITH STENT PLACEMENT  01/05/13   STEMI with thrombosis in stent to LAD   FIDUCIAL MARKER PLACEMENT  12/28/2021   Procedure: FIDUCIAL MARKER PLACEMENT;  Surgeon: Leslye Peer, MD;  Location: [facility] ENDOSCOPY;  Service: Pulmonary;;   LEFT HEART CATH N/A 12/29/2012   Procedure: LEFT HEART CATH;  Surgeon: Corky Crafts, MD;  Location: [facility] CATH LAB;  Service: Cardiovascular;  Laterality: N/A;   LEFT HEART CATHETERIZATION WITH CORONARY ANGIOGRAM N/A 01/05/2013   Procedure: LEFT HEART CATHETERIZATION WITH CORONARY ANGIOGRAM;  Surgeon: Micheline Chapman, MD;  Location: [facility] CATH LAB;  Service: Cardiovascular;  Laterality: N/A;   LEFT HEART CATHETERIZATION WITH CORONARY ANGIOGRAM N/A 04/22/2013   Procedure: LEFT HEART CATHETERIZATION WITH CORONARY ANGIOGRAM;  Surgeon: Lennette Bihari, MD;  Location: [facility] CATH LAB;  Service: Cardiovascular;  Laterality: N/A;   PARTIAL NEPHRECTOMY Left 1975   PATIENT ONLY HAS ONE KIDNEY   PERCUTANEOUS CORONARY STENT INTERVENTION (PCI-S)  12/29/2012   Procedure: PERCUTANEOUS CORONARY STENT INTERVENTION (PCI-S);  Surgeon: Corky Crafts, MD;  Location: [facility] CATH LAB;  Service: Cardiovascular;;   TEE WITHOUT CARDIOVERSION N/A 05/10/2022   Procedure: TRANSESOPHAGEAL ECHOCARDIOGRAM (TEE);  Surgeon: Sande Rives, MD;  Location: [facility] ENDOSCOPY;  Service: Cardiovascular;  Laterality: N/A;   TEE WITHOUT CARDIOVERSION N/A 05/28/2022   Procedure: TRANSESOPHAGEAL ECHOCARDIOGRAM (TEE);  Surgeon: Wendall Stade, MD;  Location: [facility] ENDOSCOPY;  Service: Cardiovascular;  Laterality: N/A;     A IV  Location/Drains/Wounds Patient Lines/Drains/Airways Status     Active Line/Drains/Airways     Name Placement date Placement time Site Days   Peripheral IV 08/17/22 20 G Right Antecubital 08/17/22  1012  Antecubital  less than 1   Wound / Incision (Open or Dehisced) 05/26/22 Laceration Toe (Comment  which one) Right Bottom of R great toe where it flexes and meets ground has split open 05/26/22  1800  Toe (Comment  which one)  83            Intake/Output Last 24 hours No intake or output data in the 24 hours ending 08/17/22 1441  Labs/Imaging Results for orders placed or performed during the hospital encounter of 08/17/22 (from the past 48 hour(s))  Basic metabolic panel     Status: Abnormal   Collection Time: 08/17/22 10:11 AM  Result Value Ref Range   Sodium 138 135 - 145 mmol/L   Potassium 3.8 3.5 - 5.1 mmol/L   Chloride 104 98 - 111 mmol/L   CO2 25 22 - 32 mmol/L   Glucose, Bld 102 (H) 70 - 99 mg/dL    Comment: Glucose reference range applies only to samples taken after fasting for at least 8 hours.   BUN 23 8 - 23 mg/dL   Creatinine, Ser 4.33 (H) 0.61 - 1.24 mg/dL   Calcium 8.3 (L) 8.9 -  10.3 mg/dL   GFR, Estimated 36 (L) >60 mL/min    Comment: (NOTE) Calculated using the CKD-EPI Creatinine Equation (2021)    Anion gap 9 5 - 15    Comment: Performed at [facility], [address] [place], [place] [address]  CBC     Status: Abnormal   Collection Time: 08/17/22 10:11 AM  Result Value Ref Range   WBC 9.5 4.0 - 10.5 K/uL   RBC 2.84 (L) 4.22 - 5.81 MIL/uL   Hemoglobin 7.3 (L) 13.0 - 17.0 g/dL   HCT 60.4 (L) 54.0 - 98.1 %   MCV 85.6 80.0 - 100.0 fL   MCH 25.7 (L) 26.0 - 34.0 pg   MCHC 30.0 30.0 - 36.0 g/dL   RDW 19.1 (H) 47.8 - 29.5 %   Platelets 223 150 - 400 K/uL   nRBC 0.0 0.0 - 0.2 %    Comment: Performed at [facility], [address], [place], [place] [address]  Troponin I (High Sensitivity)     Status: None   Collection Time: 08/17/22 10:11 AM   Result Value Ref Range   Troponin I (High Sensitivity) 5 <18 ng/L    Comment: (NOTE) Elevated high sensitivity troponin I (hsTnI) values and significant  changes across serial measurements may suggest ACS but many other  chronic and acute conditions are known to elevate hsTnI results.  Refer to the "Links" section for chest pain algorithms and additional  guidance. Performed at [facility], [address], [place], [place] [address]   Brain natriuretic peptide     Status: None   Collection Time: 08/17/22 10:13 AM  Result Value Ref Range   B Natriuretic Peptide 26.9 0.0 - 100.0 pg/mL    Comment: Performed at [facility], [address], [place], [place] [address]  D-dimer, quantitative     Status: None   Collection Time: 08/17/22 10:17 AM  Result Value Ref Range   D-Dimer, Quant 0.30 0.00 - 0.50 ug/mL-FEU    Comment: (NOTE) At the manufacturer cut-off value of 0.5 g/mL FEU, this assay has a negative predictive value of 95-100%.This assay is intended for use in conjunction with a clinical pretest probability (PTP) assessment model to exclude pulmonary embolism (PE) and deep venous thrombosis (DVT) in outpatients suspected of PE or DVT. Results should be correlated with clinical presentation. Performed at [facility], [address] [place], [place] [address]    VAS [place] ABI WITH/WO TBI  Result Date: 08/17/2022  LOWER EXTREMITY DOPPLER STUDY Patient Name:  Brendan Butler  Date of Exam:   08/17/2022 Medical Rec #: [id number]         Accession #:    [id number] Date of Birth: 29-Dec-1950        Patient Gender: M Patient Age:   75 years Exam Location:  [facility] Procedure:      VAS [place] ABI WITH/WO TBI Referring Phys: Duwayne Heck RAY --------------------------------------------------------------------------------  Indications: Rest pain. High Risk Factors: Hypertension, hyperlipidemia, coronary artery disease.  Comparison Study: No previous study. Performing  Technologist: McKayla Maag RVT, [place]  Examination Guidelines: A complete evaluation includes at minimum, Doppler waveform signals and systolic blood pressure reading at the level of bilateral brachial, anterior tibial, and posterior tibial arteries, when vessel segments are accessible. Bilateral testing is considered an integral part of a complete examination. Photoelectric Plethysmograph (PPG) waveforms and toe systolic pressure readings are included as required and additional duplex testing as needed.  Limited examinations for reoccurring indications may be performed as noted.  ABI Findings: +---------+------------------+-----+-----------+-------------------------------+ Right    Rt Pressure (mmHg)IndexWaveform   Comment                         +---------+------------------+-----+-----------+-------------------------------+ Brachial                        triphasic  No pressure obtained due to IV                                             placement                       +---------+------------------+-----+-----------+-------------------------------+ PTA      146               1.24 multiphasic                                +---------+------------------+-----+-----------+-------------------------------+ DP       143               1.21 multiphasic                                +---------+------------------+-----+-----------+-------------------------------+ Great Toe99                0.84 Normal                                     +---------+------------------+-----+-----------+-------------------------------+ +---------+------------------+-----+-----------+-------+ Left     Lt Pressure (mmHg)IndexWaveform   Comment +---------+------------------+-----+-----------+-------+ Brachial 118                    triphasic          +---------+------------------+-----+-----------+-------+ PTA      121               1.03 multiphasic         +---------+------------------+-----+-----------+-------+ DP       121               1.03 multiphasic        +---------+------------------+-----+-----------+-------+ Great Toe99                0.84 Normal             +---------+------------------+-----+-----------+-------+ +-------+-----------+-----------+------------+------------+ ABI/TBIToday's ABIToday's TBIPrevious ABIPrevious TBI +-------+-----------+-----------+------------+------------+ Right  1.24       0.84                                +-------+-----------+-----------+------------+------------+ Left   1.03       0.84                                +-------+-----------+-----------+------------+------------+  Summary: Right: Resting right ankle-brachial index is within normal range. The right toe-brachial index is normal. Left: Resting left ankle-brachial index is within normal range. The left toe-brachial index is normal. *See table(s) above for measurements and observations.     Preliminary    VAS [place] LOWER EXTREMITY  VENOUS (DVT) (7a-7p)  Result Date: 08/17/2022  Lower Venous DVT Study Patient Name:  Brendan Butler  Date of Exam:   08/17/2022 Medical Rec #: [id number]         Accession #:    [id number] Date of Birth: 11-25-1950        Patient Gender: M Patient Age:   22 years Exam Location:  [facility] Procedure:      VAS [place] LOWER EXTREMITY VENOUS (DVT) Referring Phys: Duwayne Heck RAY --------------------------------------------------------------------------------  Indications: Swelling.  Comparison Study: Previous study 07/30/22 negative. Performing Technologist: McKayla Maag RVT, [place]  Examination Guidelines: A complete evaluation includes B-mode imaging, spectral Doppler, color Doppler, and power Doppler as needed of all accessible portions of each vessel. Bilateral testing is considered an integral part of a complete examination. Limited examinations for reoccurring indications may be performed as noted. The reflux  portion of the exam is performed with the patient in reverse Trendelenburg.  +---------+---------------+---------+-----------+----------+--------------+ RIGHT    CompressibilityPhasicitySpontaneityPropertiesThrombus Aging +---------+---------------+---------+-----------+----------+--------------+ CFV      Full           Yes      Yes                                 +---------+---------------+---------+-----------+----------+--------------+ SFJ      Full                                                        +---------+---------------+---------+-----------+----------+--------------+ FV Prox  Full                                                        +---------+---------------+---------+-----------+----------+--------------+ FV Mid   Full                                                        +---------+---------------+---------+-----------+----------+--------------+ FV DistalFull                                                        +---------+---------------+---------+-----------+----------+--------------+ PFV      Full                                                        +---------+---------------+---------+-----------+----------+--------------+ POP      Full           Yes      Yes                                 +---------+---------------+---------+-----------+----------+--------------+ PTV      Full                                                        +---------+---------------+---------+-----------+----------+--------------+  PERO     Full                                                        +---------+---------------+---------+-----------+----------+--------------+   +----+---------------+---------+-----------+----------+--------------+ LEFTCompressibilityPhasicitySpontaneityPropertiesThrombus Aging +----+---------------+---------+-----------+----------+--------------+ CFV Full           Yes      Yes                                  +----+---------------+---------+-----------+----------+--------------+ SFJ Full                                                        +----+---------------+---------+-----------+----------+--------------+     Summary: RIGHT: - There is no evidence of deep vein thrombosis in the lower extremity.  - No cystic structure found in the popliteal fossa.  LEFT: - No evidence of common femoral vein obstruction.  *See table(s) above for measurements and observations.    Preliminary    DG Chest 2 View  Result Date: 08/17/2022 CLINICAL DATA:  Shortness of breath. EXAM: CHEST - 2 VIEW COMPARISON:  05/23/2022 FINDINGS: Mild cardiac enlargement. There is no evidence of pulmonary edema, consolidation, pneumothorax, nodule or pleural fluid. Visualized bony structures are unremarkable. IMPRESSION: Mild cardiac enlargement. Electronically Signed   By: Irish Lack M.D.   On: 08/17/2022 10:00    Pending Labs Unresulted Labs (From admission, onward)     Start     Ordered   08/18/22 0500  Comprehensive metabolic panel  Tomorrow morning,   R        08/17/22 1333   08/18/22 0500  CBC  Tomorrow morning,   R        08/17/22 1333   08/17/22 1342  Iron and TIBC  Add-on,   AD        08/17/22 1341   08/17/22 1342  Ferritin  Add-on,   AD        08/17/22 1341   08/17/22 1332  Hemoglobin and hematocrit, blood  Once,   R        08/17/22 1333   08/17/22 1331  Type and screen [facility]  ONCE - STAT,   STAT       Comments: [facility]    08/17/22 1333   08/17/22 1331  Occult blood card to lab, stool  Once,   R        08/17/22 1333   08/17/22 1330  Magnesium  Add-on,   AD        08/17/22 1333            Vitals/Pain Today's Vitals   08/17/22 0909 08/17/22 0912 08/17/22 1000 08/17/22 1100  BP: (!) 162/58  (!) 107/50 (!) 108/49  Pulse: (!) 55     Resp: 16  12 11   Temp: 97.9 F (36.6 C)     SpO2: 98%     Weight:  100.2 kg    Height:  5\' 10"  (1.778 m)     PainSc:  0-No pain      Isolation Precautions No active isolations  Medications Medications  sacubitril-valsartan (ENTRESTO) 24-26 mg per tablet (has no administration in time range)  rosuvastatin (CRESTOR) tablet 20 mg (has no administration in time range)  amiodarone (PACERONE) tablet 200 mg (has no administration in time range)  QUEtiapine (SEROQUEL) tablet 25 mg (has no administration in time range)  PARoxetine (PAXIL) tablet 30 mg (has no administration in time range)  buPROPion (WELLBUTRIN XL) 24 hr tablet 150 mg (has no administration in time range)  empagliflozin (JARDIANCE) tablet 10 mg (has no administration in time range)  pantoprazole (PROTONIX) EC tablet 40 mg (has no administration in time range)  pregabalin (LYRICA) capsule 150 mg (has no administration in time range)  sodium chloride flush (NS) 0.9 % injection 3 mL (has no administration in time range)  acetaminophen (TYLENOL) tablet 650 mg (has no administration in time range)    Or  acetaminophen (TYLENOL) suppository 650 mg (has no administration in time range)  polyethylene glycol (MIRALAX / GLYCOLAX) packet 17 g (has no administration in time range)    Mobility walks with device     Focused Assessments Cardiac Assessment Handoff:    Lab Results  Component Value Date   CKTOTAL 141 01/05/2013   CKMB 3.7 01/05/2013   TROPONINI <0.03 01/28/2016   Lab Results  Component Value Date   DDIMER 0.30 08/17/2022   Does the Patient currently have chest pain? No   , Pulmonary Assessment Handoff:  Lung sounds:   O2 Device: Room Air      R Recommendations: See Admitting Provider Note  Report given to:   Additional Notes:

## 2022-08-17 NOTE — Consult Note (Addendum)
Cardiology Consultation   Patient ID: Brendan Butler MRN: [id number]; DOB: 02-20-1951  Admit date: 08/17/2022 Date of Consult: 08/17/2022  PCP:  Brendan Housekeeper, MD   [facility] Providers Cardiologist:  Charlton Haws, MD        Patient Profile:   Brendan Butler is a 72 y.o. male with a hx of of CAD s/p multiple PCIs to LAD (most recently in 2015), chronic HFrEF with EF as low as 35-40% in 12/2012 with normalization to 55-60% in 11/2021 but then down again to 20-25% on TEE in 04/2022, persistent atrial flutter s/p DCCV in 04/2022 and again in 05/2022 on Xarelto, sinus pauses/bradycardia, GIB/ABL anemia 07/2022, pulmonary nodule suspicious for lung CA by chest CT 03/2022, hypertension, hyperlipidemia, CKD stage III(borderline a-b), prior L nephrectomy, obstructive sleep apnea (intolerant of CPAP), old cerebral infarcts by brain imaging, anxiety/depression, prior medication nonadherence who is being seen 08/17/2022 for the evaluation of possible CHF at the request of Dr. Alinda Money.  History of Present Illness:   Brendan Butler has h/o multiple interventions to LAD dating back to 2001. He was admitted 12/2012 for anterior STEMI treated with PCI with DES to LAD. At that time his pre-admission Plavix was changed to Brilinta. He presented back to the hospital one week later for another anterior STEMI complicated by VF on the cath table requiring defibrillation. He was found to have acute in-stent thrombosis of recently placed LAD stent. IVUS showed stent was well expanded throughout. There was an area of malopposition in the distal stent which was treated with PTCA. Echo at that time showed LVEF of 40-45%. He was felt to be a possible Brilinta failure so was switched to Effient however notes reflect poor health literacy and potential concern for med adherence so questioned if he had had true med failure. Repeat LHC in 03/2013 for recurrent chest pain showed 70% in-stent restenosis of prior LAD and  underwent repeat balloon angioplasty. He was lost to OP follow-up then seen inpatient by Dr. Eden Emms 11/2021 for pre-op evaluation for lap chole in setting of acute cholecystitis and multiple episodes of near syncope/syncope with bradycardia noted in the ED. Echo at that time showed normalization of EF to 55-60%, felt acceptable risk for surgery and had no evidence of high grade AV block during admission.   He then had multiple admissions in 2024 starting with admission 04/2022 for altered mental status, found to have atrial flutter and decline in LVEF again. Hospital course complicated by agitation and need for psychiatric consultation. He was started on Eliquis in addition to Effient. During that admission we called [facility] to find out who had been prescribing Efifent since he was not being followed by cardiology as outpatient - the last time they filled Effient had been 07/2021 (PCP prescribing) and he had not filled any other meds since 12/2021 therefore the team felt that it was unlikely he'd had failure of other antiplatelets but rather complications due to med nonadherence. Therefore Effient changed to Plavix given new initiation of DAPT and also evidence of old basal ganglia infarcts on CT head. He underwent TEE/DCCV on 3/14 to NSR and unfortunately left overnight into 3/15 and team was unable to contact him. He was readmitted 3/24-05/29/22 with recurrent atrial flutter RVR, AMS, fall, rib fractures. He was switched from Eliquis to Xarelto to help encourage compliance and was started on IV Amiodarone and Metoprolol. Amiodarone was discontinued due to concern about long-term use of this in the setting of non-compliance. He remained rate  controlled without the Amiodarone so the initial plan was to wait on repeat DCCV until he could prove compliance with anticoagulation as an outpatient. GDMT was adjusted. He was noted to have some pauses on telemetry so a live Zio monitor was placed and plan was for discharge to SNF  on 05/24/2022; however, while waiting for transportation, he was noted to have a 4 second pause followed by a 5.8 second pause. Discharge was cancelled and beta-blocker was stopped. EP was consulted and he was restarted on IV Amiodarone and then underwent repeat TEE/DCCV on 05/28/2022 with restoration of sinus rhythm. EP recommended avoiding permament pacemaker if at all possible given compliance issues. He was switched to PO amiodarone and no recurrent pauses were noted. He ended up requiring only PT/OT at DC. Of note, Plavix was also stopped that admission. He did follow up with afib clinic and NL APP following this admission. OP Zio showed 35% burden of atrial flutter with average rate 76bpm, 2 pauses longest 5.8 sec, 7.9% PACs, 88 runs SVT longest 15.5 seconds, rare PVCs - the pauses were the ones recognized during admission. Of note, he also had a recent CT and PET scan concerning for lung CA versus infection and was supposed to follow up with pulmonology but did not attend follow-up.  He was then admitted 6/3-6/4 for ABL anemia with Hgb to the 6 range down from 12, required 1 U PRBC. Colonoscopy recommended but patient declined, cleared to restart anticoag by GI, treated with IV iron. He was supposed to follow up in our clinic 08/07/22 but LWBS due to waiting 10 minutes past his appointment time. His daughter called our office 08/14/22 due to concerns for ongoing RLE edema and SOB with exertion and was advised to return to the hospital. Of note, R>LLE edema was also noted in clinic 05/2022 when venous duplex was negative. He presented back to the hospital with these complaints along with low BP, dizziness, and general malaise. He was found to have continued significant anemia with slight decline from previous DC value, 7.7->7.3. Troponin negative, BNP 26.9, d-dimer negative, Cr 1.99 slightly higher than prior (1.6). Albumin 3.1 last admission. CXR shows mild cardiac enlargement Venous duplex was negative for DVT  in the RLE. ABIs were normal. Repeat echo planned. Cardiology asked to see for symptom management given h/o CHF, also some bradycardia seen in the ED in the 40s-50s. Though EKG read by EDP as junctional bradycardia, appears to be sinus bradycardia 52bpm with low P wave amplitude without acute STT changes. He denies any recent CP, syncope, weight gain or weight loss (reports he's followed closely at home), or palpitations. Weights are fairly variable in EMR from the 190s to 220s, today 216 which is similar to mid April. He reports good med adherence.  Of note, R>LLE edema was also noted in clinic 05/2022.   Past Medical History:  Diagnosis Date   Acute blood loss anemia 07/30/2022   Acute cholecystitis 12/25/2021   Altered mental status 05/06/2022   Atrial flutter with rapid ventricular response (HCC) 05/06/2022   Basal ganglia infarction (HCC)    Bradycardia    CAD (coronary artery disease)    Chronic HFrEF (heart failure with reduced ejection fraction) (HCC)    Chronic kidney disease, stage 3 (HCC)    Erectile dysfunction    Hyperlipidemia LDL goal <70    Hypertension    OSA (obstructive sleep apnea)    No CPAP use overnight   PAF (paroxysmal atrial fibrillation) (HCC)  Paroxysmal atrial flutter (HCC)    Premature atrial contractions    Pulmonary nodules     Past Surgical History:  Procedure Laterality Date   BACK SURGERY  2007   Decompression lumbar laminectomy and micro discectomy, L4-5   BRONCHIAL BIOPSY  12/28/2021   Procedure: BRONCHIAL BIOPSIES;  Surgeon: Leslye Peer, MD;  Location: [facility] ENDOSCOPY;  Service: Pulmonary;;   BRONCHIAL BRUSHINGS  12/28/2021   Procedure: BRONCHIAL BRUSHINGS;  Surgeon: Leslye Peer, MD;  Location: [facility] ENDOSCOPY;  Service: Pulmonary;;   BRONCHIAL NEEDLE ASPIRATION BIOPSY  12/28/2021   Procedure: BRONCHIAL NEEDLE ASPIRATION BIOPSIES;  Surgeon: Leslye Peer, MD;  Location: [facility] ENDOSCOPY;  Service: Pulmonary;;   BRONCHIAL WASHINGS  12/28/2021    Procedure: BRONCHIAL WASHINGS;  Surgeon: Leslye Peer, MD;  Location: [facility] ENDOSCOPY;  Service: Pulmonary;;   CARDIAC CATHETERIZATION  2007   patent stent to LAD and normal Cors   CARDIOVERSION N/A 05/10/2022   Procedure: CARDIOVERSION;  Surgeon: Sande Rives, MD;  Location: [facility] ENDOSCOPY;  Service: Cardiovascular;  Laterality: N/A;   CARDIOVERSION N/A 05/28/2022   Procedure: CARDIOVERSION;  Surgeon: Wendall Stade, MD;  Location: [facility] ENDOSCOPY;  Service: Cardiovascular;  Laterality: N/A;   CHOLECYSTECTOMY N/A 12/26/2021   Procedure: LAPAROSCOPIC CHOLECYSTECTOMY, LAPAROSCOPIC LYSIS OF ADHESIONS GREATER THAN 1 HOUR;  Surgeon: [name], MD;  Location: [facility] OR;  Service: General;  Laterality: N/A;   CORONARY ANGIOPLASTY WITH STENT PLACEMENT  2001   to LAD   CORONARY ANGIOPLASTY WITH STENT PLACEMENT  12/29/12   STEMI with promus DES to LAD   CORONARY ANGIOPLASTY WITH STENT PLACEMENT  01/05/13   STEMI with thrombosis in stent to LAD   FIDUCIAL MARKER PLACEMENT  12/28/2021   Procedure: FIDUCIAL MARKER PLACEMENT;  Surgeon: Leslye Peer, MD;  Location: [facility] ENDOSCOPY;  Service: Pulmonary;;   LEFT HEART CATH N/A 12/29/2012   Procedure: LEFT HEART CATH;  Surgeon: Corky Crafts, MD;  Location: [facility] CATH LAB;  Service: Cardiovascular;  Laterality: N/A;   LEFT HEART CATHETERIZATION WITH CORONARY ANGIOGRAM N/A 01/05/2013   Procedure: LEFT HEART CATHETERIZATION WITH CORONARY ANGIOGRAM;  Surgeon: Micheline Chapman, MD;  Location: [facility] CATH LAB;  Service: Cardiovascular;  Laterality: N/A;   LEFT HEART CATHETERIZATION WITH CORONARY ANGIOGRAM N/A 04/22/2013   Procedure: LEFT HEART CATHETERIZATION WITH CORONARY ANGIOGRAM;  Surgeon: Lennette Bihari, MD;  Location: [facility] CATH LAB;  Service: Cardiovascular;  Laterality: N/A;   PARTIAL NEPHRECTOMY Left 1975   PATIENT ONLY HAS ONE KIDNEY   PERCUTANEOUS CORONARY STENT INTERVENTION (PCI-S)  12/29/2012   Procedure: PERCUTANEOUS CORONARY STENT INTERVENTION (PCI-S);   Surgeon: Corky Crafts, MD;  Location: [facility] CATH LAB;  Service: Cardiovascular;;   TEE WITHOUT CARDIOVERSION N/A 05/10/2022   Procedure: TRANSESOPHAGEAL ECHOCARDIOGRAM (TEE);  Surgeon: Sande Rives, MD;  Location: [facility] ENDOSCOPY;  Service: Cardiovascular;  Laterality: N/A;   TEE WITHOUT CARDIOVERSION N/A 05/28/2022   Procedure: TRANSESOPHAGEAL ECHOCARDIOGRAM (TEE);  Surgeon: Wendall Stade, MD;  Location: [facility] ENDOSCOPY;  Service: Cardiovascular;  Laterality: N/A;     Home Medications:  Prior to Admission medications   Medication Sig Start Date End Date Taking? Authorizing Provider  amiodarone (PACERONE) 200 MG tablet Take 1 tablet (200 mg total) by mouth daily. 06/18/22   Corrin Parker, PA-C  buPROPion (WELLBUTRIN XL) 150 MG 24 hr tablet Take 1 tablet (150 mg total) by mouth daily. 05/24/22   Elgergawy, Leana Roe, MD  Cyanocobalamin (VITAMIN B-12 IJ) Inject as directed every 30 (thirty) days.  [provider]  docusate sodium (COLACE) 100 MG capsule Take 1 capsule (100 mg total) by mouth 2 (two) times daily. 07/31/22 08/30/22  Jerald Kief, MD  empagliflozin (JARDIANCE) 10 MG TABS tablet Take 1 tablet (10 mg total) by mouth daily. 06/18/22   Marjie Skiff E, PA-C  ferrous sulfate 325 (65 FE) MG EC tablet Take 1 tablet (325 mg total) by mouth 2 (two) times daily. 07/31/22 08/30/22  Jerald Kief, MD  fluticasone (FLONASE) 50 MCG/ACT nasal spray Place 1 spray into both nostrils daily. 12/24/21   [provider]  furosemide (LASIX) 40 MG tablet Take 1 tablet (40 mg total) by mouth daily. 06/18/22   Marjie Skiff E, PA-C  pantoprazole (PROTONIX) 40 MG tablet Take 1 tablet (40 mg total) by mouth daily. 05/11/22   Pokhrel, Rebekah Chesterfield, MD  PARoxetine (PAXIL) 30 MG tablet Take 30 mg by mouth daily.    [provider]  potassium chloride (KLOR-CON M) 10 MEQ tablet Take 1 tablet (10 mEq total) by mouth daily as needed (take wtih lasix only). 06/14/22   Fenton, Clint R, PA   pregabalin (LYRICA) 150 MG capsule Take 1 capsule (150 mg total) by mouth 2 (two) times daily. Patient taking differently: Take 150 mg by mouth 3 (three) times daily. 05/24/22   Elgergawy, Leana Roe, MD  QUEtiapine (SEROQUEL) 25 MG tablet Take 1 tablet (25 mg total) by mouth at bedtime. 05/10/22   Pokhrel, Rebekah Chesterfield, MD  rivaroxaban (XARELTO) 20 MG TABS tablet Take 1 tablet (20 mg total) by mouth daily with supper. 06/18/22   Marjie Skiff E, PA-C  rosuvastatin (CRESTOR) 20 MG tablet Take 1 tablet (20 mg total) by mouth daily. 06/18/22   Marjie Skiff E, PA-C  sacubitril-valsartan (ENTRESTO) 24-26 MG Take 1 tablet by mouth 2 (two) times daily. 06/18/22   Corrin Parker, PA-C  spironolactone (ALDACTONE) 25 MG tablet Take 1/2 tablet (12.5 mg total) by mouth daily. 06/18/22   Corrin Parker, PA-C    Inpatient Medications: Scheduled Meds:  [START ON 08/18/2022] amiodarone  200 mg Oral Daily   [START ON 08/18/2022] buPROPion  150 mg Oral Daily   [START ON 08/18/2022] empagliflozin  10 mg Oral Daily   [START ON 08/18/2022] pantoprazole  40 mg Oral Daily   [START ON 08/18/2022] PARoxetine  30 mg Oral Daily   pregabalin  150 mg Oral TID   QUEtiapine  25 mg Oral QHS   rosuvastatin  20 mg Oral Daily   sacubitril-valsartan  1 tablet Oral BID   sodium chloride flush  3 mL Intravenous Q12H   Continuous Infusions:  PRN Meds: acetaminophen **OR** acetaminophen, polyethylene glycol  Allergies:    Allergies  Allergen Reactions   Levitra [Vardenafil] Other (See Comments)    Blurred vision   Aspirin Hives   Lipitor [Atorvastatin] Other (See Comments)    Muscle cramps; patient states he does not take medication at home    Social History:   Social History   Socioeconomic History   Marital status: Divorced    Spouse name: Not on file   Number of children: Not on file   Years of education: Not on file   Highest education level: Not on file  Occupational History   Not on file  Tobacco Use    Smoking status: Former    Packs/day: 2.00    Years: 25.00    Additional pack years: 0.00    Total pack years: 50.00    Types: Cigarettes  Quit date: 2001    Years since quitting: 23.4   Smokeless tobacco: Current    Types: Chew   Tobacco comments:    Currently Dip Tobacco     Former smoker 06/14/22  Vaping Use   Vaping Use: Never used  Substance and Sexual Activity   Alcohol use: Not Currently    Comment: 2001 stopped drinking   Drug use: Yes    Types: Marijuana    Comment: 1-2 aweek 06/13/25   Sexual activity: Not on file  Other Topics Concern   Not on file  Social History Narrative   Not on file   Social Determinants of Health   Financial Resource Strain: Not on file  Food Insecurity: No Food Insecurity (05/21/2022)   Hunger Vital Sign    Worried About Running Out of Food in the Last Year: Never true    Ran Out of Food in the Last Year: Never true  Transportation Needs: No Transportation Needs (05/21/2022)   PRAPARE - Administrator, Civil Service (Medical): No    Lack of Transportation (Non-Medical): No  Physical Activity: Not on file  Stress: Not on file  Social Connections: Not on file  Intimate Partner Violence: Not At Risk (05/21/2022)   Humiliation, Afraid, Rape, and Kick questionnaire    Fear of Current or Ex-Partner: No    Emotionally Abused: No    Physically Abused: No    Sexually Abused: No    Family History:   Family History  Problem Relation Age of Onset   Alzheimer's disease Mother    CAD Father      ROS:  Please see the history of present illness.  All other ROS reviewed and negative.     Physical Exam/Data:   Vitals:   08/17/22 1000 08/17/22 1100 08/17/22 1558 08/17/22 1600  BP: (!) 107/50 (!) 108/49 (!) 147/53   Pulse:   (!) 46   Resp: 12 11 12    Temp:   (!) 97.5 F (36.4 C)   TempSrc:   Oral   SpO2:   98%   Weight:    98.2 kg  Height:       No intake or output data in the 24 hours ending 08/17/22 1609    08/17/2022     4:00 PM 08/17/2022    9:12 AM 07/30/2022   12:48 PM  Last 3 Weights  Weight (lbs) 216 lb 7.9 oz 221 lb 224 lb  Weight (kg) 98.2 kg 100.245 kg 101.606 kg     Body mass index is 31.06 kg/m.  General: Well developed, well nourished WM, in no acute distress. Lying flat in bed without dyspnea. Head: Normocephalic, atraumatic, sclera non-icteric, no xanthomas, nares are without discharge. Neck: Negative for carotid bruits. JVP not elevated. Lungs: Clear bilaterally to auscultation without wheezes, rales, or rhonchi. Breathing is unlabored. Heart: Bradycardic but regular S1 S2 without murmurs, rubs, or gallops.  Abdomen: Soft, non-tender, non-distended with normoactive bowel sounds. No rebound/guarding. Extremities: No clubbing or cyanosis. 1+ RLE edema, trace LLE edema, both very pale. Distal pedal pulses are 1+ and equal bilaterally. Neuro: Alert and oriented X 3. Moves all extremities spontaneously. Psych:  Responds to questions appropriately with a normal affect.    EKG:  The EKG was personally reviewed and demonstrates:  SB 52bpm no acute STT changes  Telemetry:  Telemetry was personally reviewed and demonstrates:  SB upper 40s/low 50s  Relevant CV Studies: TEE 05/28/22   1. No LAA thrombus DCC x 1  with 150 J biphasic converted to NSR.   2. Left ventricular ejection fraction, by estimation, is 25 to 30%. The  left ventricle has severely decreased function. The left ventricle  demonstrates global hypokinesis. The left ventricular internal cavity size  was moderately dilated.   3. Right ventricular systolic function is normal. The right ventricular  size is normal.   4. Left atrial size was moderately dilated. No left atrial/left atrial  appendage thrombus was detected.   5. The mitral valve is normal in structure. Mild mitral valve  regurgitation. No evidence of mitral stenosis.   6. The aortic valve is tricuspid. Aortic valve regurgitation is not  visualized. No aortic stenosis is  present.   7. The inferior vena cava is normal in size with greater than 50%  respiratory variability, suggesting right atrial pressure of 3 mmHg.    Laboratory Data:  High Sensitivity Troponin:   Recent Labs  Lab 08/17/22 1011  TROPONINIHS 5     Chemistry Recent Labs  Lab 08/17/22 1011 08/17/22 1017  NA 138  --   K 3.8  --   CL 104  --   CO2 25  --   GLUCOSE 102*  --   BUN 23  --   CREATININE 1.99*  --   CALCIUM 8.3*  --   MG  --  2.4  GFRNONAA 36*  --   ANIONGAP 9  --     No results for input(s): "PROT", "ALBUMIN", "AST", "ALT", "ALKPHOS", "BILITOT" in the last 168 hours. Lipids No results for input(s): "CHOL", "TRIG", "HDL", "LABVLDL", "LDLCALC", "CHOLHDL" in the last 168 hours.  Hematology Recent Labs  Lab 08/17/22 1011  WBC 9.5  RBC 2.84*  HGB 7.3*  HCT 24.3*  MCV 85.6  MCH 25.7*  MCHC 30.0  RDW 17.2*  PLT 223   Thyroid No results for input(s): "TSH", "FREET4" in the last 168 hours.  BNP Recent Labs  Lab 08/17/22 1013  BNP 26.9    DDimer  Recent Labs  Lab 08/17/22 1017  DDIMER 0.30     Radiology/Studies:  VAS [place] ABI WITH/WO TBI  Result Date: 08/17/2022  LOWER EXTREMITY DOPPLER STUDY Patient Name:  Brendan Butler  Date of Exam:   08/17/2022 Medical Rec #: [id number]         Accession #:    [id number] Date of Birth: Aug 31, 1950        Patient Gender: M Patient Age:   14 years Exam Location:  [facility] Procedure:      VAS [place] ABI WITH/WO TBI Referring Phys: Duwayne Heck RAY --------------------------------------------------------------------------------  Indications: Rest pain. High Risk Factors: Hypertension, hyperlipidemia, coronary artery disease.  Comparison Study: No previous study. Performing Technologist: McKayla Maag RVT, [place]  Examination Guidelines: A complete evaluation includes at minimum, Doppler waveform signals and systolic blood pressure reading at the level of bilateral brachial, anterior tibial, and posterior tibial arteries, when  vessel segments are accessible. Bilateral testing is considered an integral part of a complete examination. Photoelectric Plethysmograph (PPG) waveforms and toe systolic pressure readings are included as required and additional duplex testing as needed. Limited examinations for reoccurring indications may be performed as noted.  ABI Findings: +---------+------------------+-----+-----------+-------------------------------+ Right    Rt Pressure (mmHg)IndexWaveform   Comment                         +---------+------------------+-----+-----------+-------------------------------+ Brachial  triphasic  No pressure obtained due to IV                                             placement                       +---------+------------------+-----+-----------+-------------------------------+ PTA      146               1.24 multiphasic                                +---------+------------------+-----+-----------+-------------------------------+ DP       143               1.21 multiphasic                                +---------+------------------+-----+-----------+-------------------------------+ Great Toe99                0.84 Normal                                     +---------+------------------+-----+-----------+-------------------------------+ +---------+------------------+-----+-----------+-------+ Left     Lt Pressure (mmHg)IndexWaveform   Comment +---------+------------------+-----+-----------+-------+ Brachial 118                    triphasic          +---------+------------------+-----+-----------+-------+ PTA      121               1.03 multiphasic        +---------+------------------+-----+-----------+-------+ DP       121               1.03 multiphasic        +---------+------------------+-----+-----------+-------+ Great Toe99                0.84 Normal             +---------+------------------+-----+-----------+-------+  +-------+-----------+-----------+------------+------------+ ABI/TBIToday's ABIToday's TBIPrevious ABIPrevious TBI +-------+-----------+-----------+------------+------------+ Right  1.24       0.84                                +-------+-----------+-----------+------------+------------+ Left   1.03       0.84                                +-------+-----------+-----------+------------+------------+  Summary: Right: Resting right ankle-brachial index is within normal range. The right toe-brachial index is normal. Left: Resting left ankle-brachial index is within normal range. The left toe-brachial index is normal. *See table(s) above for measurements and observations.     Preliminary    VAS [place] LOWER EXTREMITY VENOUS (DVT) (7a-7p)  Result Date: 08/17/2022  Lower Venous DVT Study Patient Name:  Brendan Butler  Date of Exam:   08/17/2022 Medical Rec #: [id number]         Accession #:    [id number] Date of Birth: May 25, 1950        Patient Gender: M Patient Age:   40 years Exam Location:  [facility] Procedure:  VAS [place] LOWER EXTREMITY VENOUS (DVT) Referring Phys: DANIELLE RAY --------------------------------------------------------------------------------  Indications: Swelling.  Comparison Study: Previous study 07/30/22 negative. Performing Technologist: McKayla Maag RVT, [place]  Examination Guidelines: A complete evaluation includes B-mode imaging, spectral Doppler, color Doppler, and power Doppler as needed of all accessible portions of each vessel. Bilateral testing is considered an integral part of a complete examination. Limited examinations for reoccurring indications may be performed as noted. The reflux portion of the exam is performed with the patient in reverse Trendelenburg.  +---------+---------------+---------+-----------+----------+--------------+ RIGHT    CompressibilityPhasicitySpontaneityPropertiesThrombus Aging  +---------+---------------+---------+-----------+----------+--------------+ CFV      Full           Yes      Yes                                 +---------+---------------+---------+-----------+----------+--------------+ SFJ      Full                                                        +---------+---------------+---------+-----------+----------+--------------+ FV Prox  Full                                                        +---------+---------------+---------+-----------+----------+--------------+ FV Mid   Full                                                        +---------+---------------+---------+-----------+----------+--------------+ FV DistalFull                                                        +---------+---------------+---------+-----------+----------+--------------+ PFV      Full                                                        +---------+---------------+---------+-----------+----------+--------------+ POP      Full           Yes      Yes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

## 2022-08-17 NOTE — Progress Notes (Signed)
Right lower extremity venous and ABI studies completed.   Preliminary results relayed to MD in ER.  Please see CV Procedures for preliminary results.  Ethanael Veith, RVT  12:12 PM 08/17/22

## 2022-08-17 NOTE — Progress Notes (Signed)
  Echocardiogram 2D Echocardiogram has been performed.  Delcie Roch 08/17/2022, 6:16 PM

## 2022-08-17 NOTE — ED Triage Notes (Addendum)
Pt arrived POV. Fluid retention. Family called cardiologist Tues/Wed and was told to come to ED. Unable to increase lasix at home d/t 1 kidney. Fatigue, shob, HH RN told family pt's BP has been soft and pt was dizzy. Currently every other daily spironolactone. Increased pain in L thigh which pt says feels like a pinched nerve. Pt abd distended and pt pale.

## 2022-08-17 NOTE — H&P (Addendum)
History and Physical   Brendan Butler ZOX:[id number] DOB: 1950-05-03 DOA: 08/17/2022  PCP: [name], MD   Patient coming from: Home  Chief Complaint: Shortness of breath, edema  HPI: Brendan Butler is a 72 y.o. male with medical history significant of CAD status post multiple stenting, symptomatic bradycardia, atrial flutter, depression, hypertension, hyperlipidemia, OSA, CKD 3A, bronchiectasis, CHF, status post left nephrectomy, ASA allergy presenting with shortness of breath and edema.  Patient presenting with worsening shortness of breath and lower extremity edema which is greater on the right than the left.  He also is reporting associated increasing fatigue and increasing weight.  He and family are concerned that he may be becoming volume overloaded and he contacted his cardiologist office who recommended he be seen in the ED.  Chart review shows home health also reported low normal blood pressures and some dizziness at home.  He is also reporting some left thigh pain that radiates down his leg.  He denies fevers, chills, chest pain, abdominal pain, constipation, diarrhea, nausea, vomiting.  He was recently admitted earlier this month with worsening anemia believed at least partially GI mediated given hemoglobin drop from previous of 12-6.7 and positive FOBT.  He had appropriate response to 1 unit transfusion and declined colonoscopy.  He was deemed stable for resumption of Xarelto on discharge.  ED Course: Vital signs in the ED notable for blood pressure in the 100s to 120s systolic, heart rate in the 40s to 50s.  Lab workup included BMP with creatinine elevated to 1.99 from baseline of 1.6, glucose 102, calcium 8.3.  CBC with hemoglobin of 7.3 down somewhat from 7.7 on discharge a week ago.  Troponin negative with repeat pending.  BNP normal.  D-dimer normal.  Chest x-ray showing mild cardiac enlargement.  DVT study negative.  EKG was sinus bradycardia versus junctional rhythm at  52 bpm.  No initial interventions in the ED.  Review of Systems: As per HPI otherwise all other systems reviewed and are negative.  Past Medical History:  Diagnosis Date   Acute blood loss anemia 07/30/2022   Acute cholecystitis 12/25/2021   Acute myocardial infarction of other anterior wall, initial episode of care 12/29/2012   Promus stent to LAD   Acute ST segment elevation MI (HCC) 01/05/2013   secondary to thrombus in stent; Brilintta changed to Effient pt with ASA allergy   Acute-on-chronic kidney injury (HCC) 01/17/2021   Altered mental status 05/06/2022   Atrial fibrillation with RVR (HCC) 05/20/2022   Atrial flutter with rapid ventricular response (HCC) 05/06/2022   Coronary artery disease 2001   stent to LAD and patent 2007 on cath   Echocardiogram abnormal 01/06/2013   EF 45-50%, grade I diastolic dysfunction   Erectile dysfunction    Hyperlipidemia LDL goal <70    Hypertension    OSA (obstructive sleep apnea)    No CPAP use overnight    Past Surgical History:  Procedure Laterality Date   BACK SURGERY  2007   Decompression lumbar laminectomy and micro discectomy, L4-5   BRONCHIAL BIOPSY  12/28/2021   Procedure: BRONCHIAL BIOPSIES;  Surgeon: Leslye Peer, MD;  Location: [facility] ENDOSCOPY;  Service: Pulmonary;;   BRONCHIAL BRUSHINGS  12/28/2021   Procedure: BRONCHIAL BRUSHINGS;  Surgeon: Leslye Peer, MD;  Location: [facility] ENDOSCOPY;  Service: Pulmonary;;   BRONCHIAL NEEDLE ASPIRATION BIOPSY  12/28/2021   Procedure: BRONCHIAL NEEDLE ASPIRATION BIOPSIES;  Surgeon: Leslye Peer, MD;  Location: [facility] ENDOSCOPY;  Service: Pulmonary;;   BRONCHIAL  WASHINGS  12/28/2021   Procedure: BRONCHIAL WASHINGS;  Surgeon: Leslye Peer, MD;  Location: [facility] ENDOSCOPY;  Service: Pulmonary;;   CARDIAC CATHETERIZATION  2007   patent stent to LAD and normal Cors   CARDIOVERSION N/A 05/10/2022   Procedure: CARDIOVERSION;  Surgeon: Sande Rives, MD;  Location: [facility] ENDOSCOPY;  Service:  Cardiovascular;  Laterality: N/A;   CARDIOVERSION N/A 05/28/2022   Procedure: CARDIOVERSION;  Surgeon: Wendall Stade, MD;  Location: [facility] ENDOSCOPY;  Service: Cardiovascular;  Laterality: N/A;   CHOLECYSTECTOMY N/A 12/26/2021   Procedure: LAPAROSCOPIC CHOLECYSTECTOMY, LAPAROSCOPIC LYSIS OF ADHESIONS GREATER THAN 1 HOUR;  Surgeon: [name], MD;  Location: [facility] OR;  Service: General;  Laterality: N/A;   CORONARY ANGIOPLASTY WITH STENT PLACEMENT  2001   to LAD   CORONARY ANGIOPLASTY WITH STENT PLACEMENT  12/29/12   STEMI with promus DES to LAD   CORONARY ANGIOPLASTY WITH STENT PLACEMENT  01/05/13   STEMI with thrombosis in stent to LAD   FIDUCIAL MARKER PLACEMENT  12/28/2021   Procedure: FIDUCIAL MARKER PLACEMENT;  Surgeon: Leslye Peer, MD;  Location: [facility] ENDOSCOPY;  Service: Pulmonary;;   LEFT HEART CATH N/A 12/29/2012   Procedure: LEFT HEART CATH;  Surgeon: Corky Crafts, MD;  Location: [facility] CATH LAB;  Service: Cardiovascular;  Laterality: N/A;   LEFT HEART CATHETERIZATION WITH CORONARY ANGIOGRAM N/A 01/05/2013   Procedure: LEFT HEART CATHETERIZATION WITH CORONARY ANGIOGRAM;  Surgeon: Micheline Chapman, MD;  Location: [facility] CATH LAB;  Service: Cardiovascular;  Laterality: N/A;   LEFT HEART CATHETERIZATION WITH CORONARY ANGIOGRAM N/A 04/22/2013   Procedure: LEFT HEART CATHETERIZATION WITH CORONARY ANGIOGRAM;  Surgeon: Lennette Bihari, MD;  Location: [facility] CATH LAB;  Service: Cardiovascular;  Laterality: N/A;   PARTIAL NEPHRECTOMY Left 1975   PATIENT ONLY HAS ONE KIDNEY   PERCUTANEOUS CORONARY STENT INTERVENTION (PCI-S)  12/29/2012   Procedure: PERCUTANEOUS CORONARY STENT INTERVENTION (PCI-S);  Surgeon: Corky Crafts, MD;  Location: [facility] CATH LAB;  Service: Cardiovascular;;   TEE WITHOUT CARDIOVERSION N/A 05/10/2022   Procedure: TRANSESOPHAGEAL ECHOCARDIOGRAM (TEE);  Surgeon: Sande Rives, MD;  Location: [facility] ENDOSCOPY;  Service: Cardiovascular;  Laterality: N/A;   TEE WITHOUT  CARDIOVERSION N/A 05/28/2022   Procedure: TRANSESOPHAGEAL ECHOCARDIOGRAM (TEE);  Surgeon: Wendall Stade, MD;  Location: [facility] ENDOSCOPY;  Service: Cardiovascular;  Laterality: N/A;    Social History  reports that he quit smoking about 23 years ago. His smoking use included cigarettes. He has a 50.00 pack-year smoking history. His smokeless tobacco use includes chew. He reports that he does not currently use alcohol. He reports current drug use. Drug: Marijuana.  Allergies  Allergen Reactions   Levitra [Vardenafil] Other (See Comments)    Blurred vision   Aspirin Hives   Lipitor [Atorvastatin] Other (See Comments)    Muscle cramps; patient states he does not take medication at home    Family History  Problem Relation Age of Onset   Alzheimer's disease Mother    CAD Father   Reviewed on admission  Prior to Admission medications   Medication Sig Start Date End Date Taking? Authorizing Provider  amiodarone (PACERONE) 200 MG tablet Take 1 tablet (200 mg total) by mouth daily. 06/18/22   Corrin Parker, PA-C  buPROPion (WELLBUTRIN XL) 150 MG 24 hr tablet Take 1 tablet (150 mg total) by mouth daily. 05/24/22   Elgergawy, Leana Roe, MD  Cyanocobalamin (VITAMIN B-12 IJ) Inject as directed every 30 (thirty) days.    [provider]  docusate sodium (  COLACE) 100 MG capsule Take 1 capsule (100 mg total) by mouth 2 (two) times daily. 07/31/22 08/30/22  Jerald Kief, MD  empagliflozin (JARDIANCE) 10 MG TABS tablet Take 1 tablet (10 mg total) by mouth daily. 06/18/22   Marjie Skiff E, PA-C  ferrous sulfate 325 (65 FE) MG EC tablet Take 1 tablet (325 mg total) by mouth 2 (two) times daily. 07/31/22 08/30/22  Jerald Kief, MD  fluticasone (FLONASE) 50 MCG/ACT nasal spray Place 1 spray into both nostrils daily. 12/24/21   [provider]  furosemide (LASIX) 40 MG tablet Take 1 tablet (40 mg total) by mouth daily. 06/18/22   Marjie Skiff E, PA-C  pantoprazole (PROTONIX) 40 MG tablet  Take 1 tablet (40 mg total) by mouth daily. 05/11/22   Pokhrel, Rebekah Chesterfield, MD  PARoxetine (PAXIL) 30 MG tablet Take 30 mg by mouth daily.    [provider]  potassium chloride (KLOR-CON M) 10 MEQ tablet Take 1 tablet (10 mEq total) by mouth daily as needed (take wtih lasix only). 06/14/22   Fenton, Clint R, PA  pregabalin (LYRICA) 150 MG capsule Take 1 capsule (150 mg total) by mouth 2 (two) times daily. Patient taking differently: Take 150 mg by mouth 3 (three) times daily. 05/24/22   Elgergawy, Leana Roe, MD  QUEtiapine (SEROQUEL) 25 MG tablet Take 1 tablet (25 mg total) by mouth at bedtime. 05/10/22   Pokhrel, Rebekah Chesterfield, MD  rivaroxaban (XARELTO) 20 MG TABS tablet Take 1 tablet (20 mg total) by mouth daily with supper. 06/18/22   Marjie Skiff E, PA-C  rosuvastatin (CRESTOR) 20 MG tablet Take 1 tablet (20 mg total) by mouth daily. 06/18/22   Marjie Skiff E, PA-C  sacubitril-valsartan (ENTRESTO) 24-26 MG Take 1 tablet by mouth 2 (two) times daily. 06/18/22   Corrin Parker, PA-C  spironolactone (ALDACTONE) 25 MG tablet Take 1/2 tablet (12.5 mg total) by mouth daily. 06/18/22   Corrin Parker, PA-C    Physical Exam: Vitals:   08/17/22 0909 08/17/22 0912  BP: (!) 162/58   Pulse: (!) 55   Resp: 16   Temp: 97.9 F (36.6 C)   SpO2: 98%   Weight:  100.2 kg  Height:  5\' 10"  (1.778 m)    Physical Exam Constitutional:      General: He is not in acute distress.    Appearance: Normal appearance. He is obese.  HENT:     Head: Normocephalic and atraumatic.     Mouth/Throat:     Mouth: Mucous membranes are moist.     Pharynx: Oropharynx is clear.  Eyes:     Extraocular Movements: Extraocular movements intact.     Pupils: Pupils are equal, round, and reactive to light.  Cardiovascular:     Rate and Rhythm: Regular rhythm. Bradycardia present.     Pulses: Normal pulses.     Heart sounds: Normal heart sounds.  Pulmonary:     Effort: Pulmonary effort is normal. No respiratory  distress.     Breath sounds: Rales present.  Abdominal:     General: Bowel sounds are normal. There is no distension.     Palpations: Abdomen is soft.     Tenderness: There is no abdominal tenderness.  Musculoskeletal:        General: No swelling or deformity.     Right lower leg: Edema (R>L) present.     Left lower leg: Edema present.  Skin:    General: Skin is warm and dry.  Neurological:  General: No focal deficit present.     Mental Status: Mental status is at baseline.    Labs on Admission: I have personally reviewed following labs and imaging studies  CBC: Recent Labs  Lab 08/17/22 1011  WBC 9.5  HGB 7.3*  HCT 24.3*  MCV 85.6  PLT 223    Basic Metabolic Panel: Recent Labs  Lab 08/17/22 1011  NA 138  K 3.8  CL 104  CO2 25  GLUCOSE 102*  BUN 23  CREATININE 1.99*  CALCIUM 8.3*    GFR: Estimated Creatinine Clearance: 41.6 mL/min (A) (by C-G formula based on SCr of 1.99 mg/dL (H)).  Liver Function Tests: No results for input(s): "AST", "ALT", "ALKPHOS", "BILITOT", "PROT", "ALBUMIN" in the last 168 hours.  Urine analysis:    Component Value Date/Time   COLORURINE AMBER (A) 05/20/2022 2038   APPEARANCEUR CLEAR 05/20/2022 2038   LABSPEC 1.034 (H) 05/20/2022 2038   PHURINE 5.0 05/20/2022 2038   GLUCOSEU NEGATIVE 05/20/2022 2038   HGBUR NEGATIVE 05/20/2022 2038   BILIRUBINUR NEGATIVE 05/20/2022 2038   KETONESUR NEGATIVE 05/20/2022 2038   PROTEINUR NEGATIVE 05/20/2022 2038   NITRITE NEGATIVE 05/20/2022 2038   LEUKOCYTESUR NEGATIVE 05/20/2022 2038    Radiological Exams on Admission: VAS [place] ABI WITH/WO TBI  Result Date: 08/17/2022  LOWER EXTREMITY DOPPLER STUDY Patient Name:  Brendan Butler  Date of Exam:   08/17/2022 Medical Rec #: [id number]         Accession #:    [id number] Date of Birth: 26-Oct-1950        Patient Gender: M Patient Age:   53 years Exam Location:  [facility] Procedure:      VAS [place] ABI WITH/WO TBI Referring Phys: Duwayne Heck  RAY --------------------------------------------------------------------------------  Indications: Rest pain. High Risk Factors: Hypertension, hyperlipidemia, coronary artery disease.  Comparison Study: No previous study. Performing Technologist: McKayla Maag RVT, [place]  Examination Guidelines: A complete evaluation includes at minimum, Doppler waveform signals and systolic blood pressure reading at the level of bilateral brachial, anterior tibial, and posterior tibial arteries, when vessel segments are accessible. Bilateral testing is considered an integral part of a complete examination. Photoelectric Plethysmograph (PPG) waveforms and toe systolic pressure readings are included as required and additional duplex testing as needed. Limited examinations for reoccurring indications may be performed as noted.  ABI Findings: +---------+------------------+-----+-----------+-------------------------------+ Right    Rt Pressure (mmHg)IndexWaveform   Comment                         +---------+------------------+-----+-----------+-------------------------------+ Brachial                        triphasic  No pressure obtained due to IV                                             placement                       +---------+------------------+-----+-----------+-------------------------------+ PTA      146               1.24 multiphasic                                +---------+------------------+-----+-----------+-------------------------------+ DP  143               1.21 multiphasic                                +---------+------------------+-----+-----------+-------------------------------+ Great Toe99                0.84 Normal                                     +---------+------------------+-----+-----------+-------------------------------+ +---------+------------------+-----+-----------+-------+ Left     Lt Pressure (mmHg)IndexWaveform   Comment  +---------+------------------+-----+-----------+-------+ Brachial 118                    triphasic          +---------+------------------+-----+-----------+-------+ PTA      121               1.03 multiphasic        +---------+------------------+-----+-----------+-------+ DP       121               1.03 multiphasic        +---------+------------------+-----+-----------+-------+ Great Toe99                0.84 Normal             +---------+------------------+-----+-----------+-------+ +-------+-----------+-----------+------------+------------+ ABI/TBIToday's ABIToday's TBIPrevious ABIPrevious TBI +-------+-----------+-----------+------------+------------+ Right  1.24       0.84                                +-------+-----------+-----------+------------+------------+ Left   1.03       0.84                                +-------+-----------+-----------+------------+------------+  Summary: Right: Resting right ankle-brachial index is within normal range. The right toe-brachial index is normal. Left: Resting left ankle-brachial index is within normal range. The left toe-brachial index is normal. *See table(s) above for measurements and observations.     Preliminary    VAS [place] LOWER EXTREMITY VENOUS (DVT) (7a-7p)  Result Date: 08/17/2022  Lower Venous DVT Study Patient Name:  Brendan Butler  Date of Exam:   08/17/2022 Medical Rec #: [id number]         Accession #:    [id number] Date of Birth: November 08, 1950        Patient Gender: M Patient Age:   37 years Exam Location:  [facility] Procedure:      VAS [place] LOWER EXTREMITY VENOUS (DVT) Referring Phys: Duwayne Heck RAY --------------------------------------------------------------------------------  Indications: Swelling.  Comparison Study: Previous study 07/30/22 negative. Performing Technologist: McKayla Maag RVT, [place]  Examination Guidelines: A complete evaluation includes B-mode imaging, spectral Doppler, color Doppler, and  power Doppler as needed of all accessible portions of each vessel. Bilateral testing is considered an integral part of a complete examination. Limited examinations for reoccurring indications may be performed as noted. The reflux portion of the exam is performed with the patient in reverse Trendelenburg.  +---------+---------------+---------+-----------+----------+--------------+ RIGHT    CompressibilityPhasicitySpontaneityPropertiesThrombus Aging +---------+---------------+---------+-----------+----------+--------------+ CFV      Full           Yes      Yes                                 +---------+---------------+---------+-----------+----------+--------------+  SFJ      Full                                                        +---------+---------------+---------+-----------+----------+--------------+ FV Prox  Full                                                        +---------+---------------+---------+-----------+----------+--------------+ FV Mid   Full                                                        +---------+---------------+---------+-----------+----------+--------------+ FV DistalFull                                                        +---------+---------------+---------+-----------+----------+--------------+ PFV      Full                                                        +---------+---------------+---------+-----------+----------+--------------+ POP      Full           Yes      Yes                                 +---------+---------------+---------+-----------+----------+--------------+ PTV      Full                                                        +---------+---------------+---------+-----------+----------+--------------+ PERO     Full                                                        +---------+---------------+---------+-----------+----------+--------------+    +----+---------------+---------+-----------+----------+--------------+ LEFTCompressibilityPhasicitySpontaneityPropertiesThrombus Aging +----+---------------+---------+-----------+----------+--------------+ CFV Full           Yes      Yes                                 +----+---------------+---------+-----------+----------+--------------+ SFJ Full                                                        +----+---------------+---------+-----------+----------+--------------+  Summary: RIGHT: - There is no evidence of deep vein thrombosis in the lower extremity.  - No cystic structure found in the popliteal fossa.  LEFT: - No evidence of common femoral vein obstruction.  *See table(s) above for measurements and observations.    Preliminary    DG Chest 2 View  Result Date: 08/17/2022 CLINICAL DATA:  Shortness of breath. EXAM: CHEST - 2 VIEW COMPARISON:  05/23/2022 FINDINGS: Mild cardiac enlargement. There is no evidence of pulmonary edema, consolidation, pneumothorax, nodule or pleural fluid. Visualized bony structures are unremarkable. IMPRESSION: Mild cardiac enlargement. Electronically Signed   By: Irish Lack M.D.   On: 08/17/2022 10:00    EKG: Independently reviewed.  Chest bradycardia versus junctional rhythm at 52 bpm  Assessment/Plan Principal Problem:   Symptomatic anemia Active Problems:   Bronchiectasis (HCC)   Dyslipidemia   Essential hypertension   Coronary artery disease involving native coronary artery of native heart without angina pectoris   OSA on CPAP   MDD (major depressive disorder), recurrent episode, mild (HCC)   Persistent atrial fibrillation (HCC)   Dyspnea Symptomatic anemia ? CHF exacerbation ? Symptomatic bradycardia > Patient presenting with worsening shortness of breath, edema, weight gain, low normal blood pressure and dizziness. > This appears to be multifactorial in the setting of recent GI bleeding and hemoglobin now around 7.3 in the  setting of CHF as well as bradycardia in the 40s to 50s. > Goal hemoglobin should be around 8.  Recent admission for GI bleed as per HPI with colonoscopy declined but appropriate response to transfusion and stable hemoglobin following this.  Was resumed on Xarelto after discharge no significant bleeding reported and hemoglobin is only mildly down to 7.3 from 7.7.  Iron studies done previously showed iron of 12 but no ferritin tested this decline may be due to iron deficiency/production issue rather than continued bleeding. > Given developing AKI, volume overload, but normal BNP and history of nephrectomy as well as no low normal blood pressure and recent low EF in the 20 to 30% range (believed to be tachycardia mediated), have requested cardiology input transfusion/diuresis. > For now plan to hold off on transfusion pending echocardiogram and cardiology input. - Monitor in progressive unit - Cardiology to be consulted in the ED, appreciate recommendations - Trend hemoglobin, plan for transfusion of 1U pRBCs with goal hemoglobin of 8 - Type and screen, Hemoccult - Add on ferritin and iron - Echocardiogram - Continue home [place] - Holding home spironolactone  AKI on CKD 3A > Creatinine elevated to 1.99 from baseline of 1.6. > Unclear if related to increased diuresis recently versus volume overload vs decreased EAP.  Some contradicting labs and results with his edema on exam but normal BNP. - Likely will need diuresis with blood infusion, will monitor for improvement after intervention - Trend renal function and electrolytes  Bradycardia Atrial flutter > History of persistent atrial flutter with pauses.  Now status post DC cardioversion/TEE in April and has been placed on amiodarone. > Rhythm in the ED is sinus bradycardia versus junctional rhythm at 52 bpm.  Some readings dropping into the 40s. - Appreciate cardiology recommendations - Continue with home amiodarone - Hold Xarelto  for now while working up for any recurrent bleeding  CAD HLD > Status post multiple stenting > Does notably have an aspirin allergy - Continue home Entresto, statin,  - Hold Xarelto as above  Depression - Continue home bupropion, paroxetine, Seroquel  Hypertension - Continue home [place] - Holding  home spironolactone  OSA - Not on CPAP per chart review  DVT prophylaxis: SCDs for now, likely will be able to resume Xarelto Code Status:   Full Family Communication:  None on admission  Disposition Plan:   Patient is from:  Home  Anticipated DC to:  Home  Anticipated DC date:  1 to 3 days  Anticipated DC barriers: None  Consults called:  Cardiology consulted in the ED Admission status:  Observation, progressive  Severity of Illness: The appropriate patient status for this patient is OBSERVATION. Observation status is judged to be reasonable and necessary in order to provide the required intensity of service to ensure the patient's safety. The patient's presenting symptoms, physical exam findings, and initial radiographic and laboratory data in the context of their medical condition is felt to place them at decreased risk for further clinical deterioration. Furthermore, it is anticipated that the patient will be medically stable for discharge from the hospital within 2 midnights of admission.    Synetta Fail MD [facility]  How to contact the [facility] Attending or Consulting provider 7A - 7P or covering provider during after hours 7P -7A, for this patient?   Check the care team in [facility] and look for a) attending/consulting [facility] provider listed and b) the [facility] team listed Log into [email or web address] and use [place]'s universal password to access. If you do not have the password, please contact the hospital operator. Locate the [facility] provider you are looking for under [facility] and page to a number that you can be directly reached. If you still have difficulty reaching  the provider, please page the [facility] (Director on Call) for the Hospitalists listed on amion for assistance.  08/17/2022, 1:33 PM

## 2022-08-17 NOTE — ED Provider Notes (Signed)
[facility] Provider Note   CSN: [id number] Arrival date & time: 08/17/22  0865     History  Chief Complaint  Patient presents with   Fluid Overload    Brendan Butler is a 72 y.o. male.  HPI  72 yo Male history of CHF, recent admission for acute blood loss anemia presents today complaining of right leg swelling more than left leg and dyspnea with laying flat.  He has noted some increased weight.  After his last hospitalization he reports that his Lasix was doubled.  He reports he has previously with his CHF had asymmetrical edema.  He is not any report any injury to his right leg, history of DVT, or PE.  He has not had any headache, head injury, chest pain, nausea, vomiting, diarrhea.  He does note that he has had some increased bloating and some generalized weight gain as well as some left leg pain with exertion that he reports is radiating down from his hip to his foot.  He was a smoker in the past but is not smoked for several years.  Patient had his Xarelto stopped during his last hospitalization and then had it restarted on discharge.  The blood loss was thought to be secondary to a fall with some rib fractures and having recently been started on Xarelto.  They did think there was a possibility of GI bleeding but patient had a lot planned colonoscopy.    Home Medications Prior to Admission medications   Medication Sig Start Date End Date Taking? Authorizing Provider  amiodarone (PACERONE) 200 MG tablet Take 1 tablet (200 mg total) by mouth daily. 06/18/22   Corrin Parker, PA-C  buPROPion (WELLBUTRIN XL) 150 MG 24 hr tablet Take 1 tablet (150 mg total) by mouth daily. 05/24/22   Elgergawy, Leana Roe, MD  Cyanocobalamin (VITAMIN B-12 IJ) Inject as directed every 30 (thirty) days.    [provider]  docusate sodium (COLACE) 100 MG capsule Take 1 capsule (100 mg total) by mouth 2 (two) times daily. 07/31/22 08/30/22  Jerald Kief,  MD  empagliflozin (JARDIANCE) 10 MG TABS tablet Take 1 tablet (10 mg total) by mouth daily. 06/18/22   Marjie Skiff E, PA-C  ferrous sulfate 325 (65 FE) MG EC tablet Take 1 tablet (325 mg total) by mouth 2 (two) times daily. 07/31/22 08/30/22  Jerald Kief, MD  fluticasone (FLONASE) 50 MCG/ACT nasal spray Place 1 spray into both nostrils daily. 12/24/21   [provider]  furosemide (LASIX) 40 MG tablet Take 1 tablet (40 mg total) by mouth daily. 06/18/22   Marjie Skiff E, PA-C  pantoprazole (PROTONIX) 40 MG tablet Take 1 tablet (40 mg total) by mouth daily. 05/11/22   Pokhrel, Rebekah Chesterfield, MD  PARoxetine (PAXIL) 30 MG tablet Take 30 mg by mouth daily.    [provider]  potassium chloride (KLOR-CON M) 10 MEQ tablet Take 1 tablet (10 mEq total) by mouth daily as needed (take wtih lasix only). 06/14/22   Fenton, Clint R, PA  pregabalin (LYRICA) 150 MG capsule Take 1 capsule (150 mg total) by mouth 2 (two) times daily. Patient taking differently: Take 150 mg by mouth 3 (three) times daily. 05/24/22   Elgergawy, Leana Roe, MD  QUEtiapine (SEROQUEL) 25 MG tablet Take 1 tablet (25 mg total) by mouth at bedtime. 05/10/22   Pokhrel, Rebekah Chesterfield, MD  rivaroxaban (XARELTO) 20 MG TABS tablet Take 1 tablet (20 mg total) by mouth  daily with supper. 06/18/22   Marjie Skiff E, PA-C  rosuvastatin (CRESTOR) 20 MG tablet Take 1 tablet (20 mg total) by mouth daily. 06/18/22   Marjie Skiff E, PA-C  sacubitril-valsartan (ENTRESTO) 24-26 MG Take 1 tablet by mouth 2 (two) times daily. 06/18/22   Corrin Parker, PA-C  spironolactone (ALDACTONE) 25 MG tablet Take 1/2 tablet (12.5 mg total) by mouth daily. 06/18/22   Corrin Parker, PA-C      Allergies    Levitra [vardenafil], Aspirin, and Lipitor [atorvastatin]    Review of Systems   Review of Systems  Physical Exam Updated Vital Signs BP (!) 162/58 (BP Location: Right Arm)   Pulse (!) 55   Temp 97.9 F (36.6 C)   Resp 16   Ht 1.778 m (5'  10")   Wt 100.2 kg   SpO2 98%   BMI 31.71 kg/m  Physical Exam Vitals and nursing note reviewed.  Constitutional:      Appearance: Normal appearance. He is obese.  HENT:     Head: Normocephalic.     Right Ear: External ear normal.     Left Ear: External ear normal.     Nose: Nose normal.     Mouth/Throat:     Pharynx: Oropharynx is clear.  Eyes:     Extraocular Movements: Extraocular movements intact.     Pupils: Pupils are equal, round, and reactive to light.  Cardiovascular:     Rate and Rhythm: Normal rate and regular rhythm.  Pulmonary:     Effort: Pulmonary effort is normal.     Breath sounds: Normal breath sounds.  Abdominal:     General: Abdomen is flat.     Palpations: Abdomen is soft.  Musculoskeletal:        General: Swelling present. Normal range of motion.     Cervical back: Normal range of motion.     Comments: Swelling with pitting edema bilateral lower extremities right greater than left Pulses are intact in bilateral feet  Skin:    Coloration: Skin is pale.  Neurological:     General: No focal deficit present.     Mental Status: He is alert.  Psychiatric:        Mood and Affect: Mood normal.        Behavior: Behavior normal.     ED Results / Procedures / Treatments   Labs (all labs ordered are listed, but only abnormal results are displayed) Labs Reviewed  BASIC METABOLIC PANEL - Abnormal; Notable for the following components:      Result Value   Glucose, Bld 102 (*)    Creatinine, Ser 1.99 (*)    Calcium 8.3 (*)    GFR, Estimated 36 (*)    All other components within normal limits  CBC - Abnormal; Notable for the following components:   RBC 2.84 (*)    Hemoglobin 7.3 (*)    HCT 24.3 (*)    MCH 25.7 (*)    RDW 17.2 (*)    All other components within normal limits  BRAIN NATRIURETIC PEPTIDE  D-DIMER, QUANTITATIVE  TROPONIN I (HIGH SENSITIVITY)  TROPONIN I (HIGH SENSITIVITY)    EKG EKG Interpretation  Date/Time:  Friday August 17 2022  09:11:49 EDT Ventricular Rate:  52 PR Interval:    QRS Duration: 102 QT Interval:  430 QTC Calculation: 399 R Axis:   -8 Text Interpretation: Junctional rhythm Abnormal ECG When compared with ECG of 14-Jun-2022 14:27, PREVIOUS ECG IS PRESENT Confirmed by Margarita Grizzle (  [id number]) on 08/17/2022 12:57:36 PM  Radiology VAS [place] ABI WITH/WO TBI  Result Date: 08/17/2022  LOWER EXTREMITY DOPPLER STUDY Patient Name:  Brendan Butler  Date of Exam:   08/17/2022 Medical Rec #: [id number]         Accession #:    [id number] Date of Birth: Mar 04, 1950        Patient Gender: M Patient Age:   44 years Exam Location:  [facility] Procedure:      VAS [place] ABI WITH/WO TBI Referring Phys: Duwayne Heck Henessy Rohrer --------------------------------------------------------------------------------  Indications: Rest pain. High Risk Factors: Hypertension, hyperlipidemia, coronary artery disease.  Comparison Study: No previous study. Performing Technologist: McKayla Maag RVT, [place]  Examination Guidelines: A complete evaluation includes at minimum, Doppler waveform signals and systolic blood pressure reading at the level of bilateral brachial, anterior tibial, and posterior tibial arteries, when vessel segments are accessible. Bilateral testing is considered an integral part of a complete examination. Photoelectric Plethysmograph (PPG) waveforms and toe systolic pressure readings are included as required and additional duplex testing as needed. Limited examinations for reoccurring indications may be performed as noted.  ABI Findings: +---------+------------------+-----+-----------+-------------------------------+ Right    Rt Pressure (mmHg)IndexWaveform   Comment                         +---------+------------------+-----+-----------+-------------------------------+ Brachial                        triphasic  No pressure obtained due to IV                                             placement                        +---------+------------------+-----+-----------+-------------------------------+ PTA      146               1.24 multiphasic                                +---------+------------------+-----+-----------+-------------------------------+ DP       143               1.21 multiphasic                                +---------+------------------+-----+-----------+-------------------------------+ Great Toe99                0.84 Normal                                     +---------+------------------+-----+-----------+-------------------------------+ +---------+------------------+-----+-----------+-------+ Left     Lt Pressure (mmHg)IndexWaveform   Comment +---------+------------------+-----+-----------+-------+ Brachial 118                    triphasic          +---------+------------------+-----+-----------+-------+ PTA      121               1.03 multiphasic        +---------+------------------+-----+-----------+-------+ DP       121               1.03 multiphasic        +---------+------------------+-----+-----------+-------+  Great Toe99                0.84 Normal             +---------+------------------+-----+-----------+-------+ +-------+-----------+-----------+------------+------------+ ABI/TBIToday's ABIToday's TBIPrevious ABIPrevious TBI +-------+-----------+-----------+------------+------------+ Right  1.24       0.84                                +-------+-----------+-----------+------------+------------+ Left   1.03       0.84                                +-------+-----------+-----------+------------+------------+  Summary: Right: Resting right ankle-brachial index is within normal range. The right toe-brachial index is normal. Left: Resting left ankle-brachial index is within normal range. The left toe-brachial index is normal. *See table(s) above for measurements and observations.     Preliminary    VAS [place] LOWER EXTREMITY VENOUS (DVT)  (7a-7p)  Result Date: 08/17/2022  Lower Venous DVT Study Patient Name:  Brendan Butler  Date of Exam:   08/17/2022 Medical Rec #: [id number]         Accession #:    [id number] Date of Birth: Feb 12, 1951        Patient Gender: M Patient Age:   27 years Exam Location:  [facility] Procedure:      VAS [place] LOWER EXTREMITY VENOUS (DVT) Referring Phys: Duwayne Heck Lainie Daubert --------------------------------------------------------------------------------  Indications: Swelling.  Comparison Study: Previous study 07/30/22 negative. Performing Technologist: McKayla Maag RVT, [place]  Examination Guidelines: A complete evaluation includes B-mode imaging, spectral Doppler, color Doppler, and power Doppler as needed of all accessible portions of each vessel. Bilateral testing is considered an integral part of a complete examination. Limited examinations for reoccurring indications may be performed as noted. The reflux portion of the exam is performed with the patient in reverse Trendelenburg.  +---------+---------------+---------+-----------+----------+--------------+ RIGHT    CompressibilityPhasicitySpontaneityPropertiesThrombus Aging +---------+---------------+---------+-----------+----------+--------------+ CFV      Full           Yes      Yes                                 +---------+---------------+---------+-----------+----------+--------------+ SFJ      Full                                                        +---------+---------------+---------+-----------+----------+--------------+ FV Prox  Full                                                        +---------+---------------+---------+-----------+----------+--------------+ FV Mid   Full                                                        +---------+---------------+---------+-----------+----------+--------------+ FV DistalFull                                                         +---------+---------------+---------+-----------+----------+--------------+  PFV      Full                                                        +---------+---------------+---------+-----------+----------+--------------+ POP      Full           Yes      Yes                                 +---------+---------------+---------+-----------+----------+--------------+ PTV      Full                                                        +---------+---------------+---------+-----------+----------+--------------+ PERO     Full                                                        +---------+---------------+---------+-----------+----------+--------------+   +----+---------------+---------+-----------+----------+--------------+ LEFTCompressibilityPhasicitySpontaneityPropertiesThrombus Aging +----+---------------+---------+-----------+----------+--------------+ CFV Full           Yes      Yes                                 +----+---------------+---------+-----------+----------+--------------+ SFJ Full                                                        +----+---------------+---------+-----------+----------+--------------+     Summary: RIGHT: - There is no evidence of deep vein thrombosis in the lower extremity.  - No cystic structure found in the popliteal fossa.  LEFT: - No evidence of common femoral vein obstruction.  *See table(s) above for measurements and observations.    Preliminary    DG Chest 2 View  Result Date: 08/17/2022 CLINICAL DATA:  Shortness of breath. EXAM: CHEST - 2 VIEW COMPARISON:  05/23/2022 FINDINGS: Mild cardiac enlargement. There is no evidence of pulmonary edema, consolidation, pneumothorax, nodule or pleural fluid. Visualized bony structures are unremarkable. IMPRESSION: Mild cardiac enlargement. Electronically Signed   By: Irish Lack M.D.   On: 08/17/2022 10:00    Procedures .Critical Care  Performed by: Margarita Grizzle, MD Authorized  by: Margarita Grizzle, MD   Critical care provider statement:    Critical care time (minutes):  30   Critical care was necessary to treat or prevent imminent or life-threatening deterioration of the following conditions:  Respiratory failure   Critical care was time spent personally by me on the following activities:  Development of treatment plan with patient or surrogate, discussions with consultants, evaluation of patient's response to treatment, examination of patient, ordering and review of laboratory studies, ordering and review of radiographic studies, ordering and performing treatments and interventions, pulse oximetry, re-evaluation of patient's condition and review of old charts     Medications  Ordered in ED Medications - No data to display  ED Course/ Medical Decision Making/ A&P                             Medical Decision Making Amount and/or Complexity of Data Reviewed Labs: ordered. Radiology: ordered.   72 year old male presents today with increased dyspnea and leg swelling. Patient was recently admitted and discharged for blood loss anemia with hemoglobin of 6.  He received packed red blood cells and had some improvement in symptoms.  He has had increasing dyspnea over the past week.  Of note, he was noted to be Hemoccult positive in the hospital and declined colonoscopy.  He is on Xarelto for A-fib and was restarted on his Xarelto on discharge. Differential diagnosis includes but is not limited to CHF, volume overload, DVT and PE, anemia Patient was evaluated here with venous Doppler of the right lower extremity which is more swollen than the left.  There is no evidence of DVT he had some pain in his left leg and had a abi performed of the lower extremities that were normal. Chest x-Roshana Shuffield is significant for mild cardiac enlargement Hemoglobin is 7.3 Creatinine is 1.99, previous creatinine is 1.65.  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 BNP is  26 D-dimer is 0.3 1 dyspnea may have some component of volume overload with increased edema, mild cardiac enlargement, but normal BNP.  Oxygen saturations are normal.  However, I suspect that patient's dyspnea is secondary to his anemia.  Globin has trended down somewhat at 7.3. Patient may have some component of CHF.  He has a history of CHF and does have some cardiomegaly.  However as BNP is not elevated.  He has had increased diuresis and creatinine has increased to 1.99 from baseline of about 1.6.  Had a previous nephrectomy.  Therefore I be hesitant to increase his diuretics at this time. 2 anemia patient originally admitted with hemoglobin of 6.7.  He received packed red blood cells and hemoglobin did go up to 7.7.  However prior to discharge patient had Xarelto restarted.  He was noted to be Hemoccult positive previously but declined colonoscopy.  His hemoglobin has now gone back down to 7.3 which is likely enough to cause him some symptoms. 3 left leg pain patient complains of some left leg pain with ambulating.  Had some decreased pulses and ABI is normal.  Therefore, suspect that this is more secondary to some sciatic type pain he has no specific back pain and is not having further evaluation of this here in the ED. Plan consult to hospitalist for admission and transfusion and will likely need cardiology consult while in the hospital. Placed call to cardiology for consult       Final Clinical Impression(s) / ED Diagnoses Final diagnoses:  Dyspnea, unspecified type    Rx / DC Orders ED Discharge Orders     None         Margarita Grizzle, MD 08/17/22 1328

## 2022-08-17 NOTE — Progress Notes (Signed)
ED to Inpatient Handoff Report reviewed

## 2022-08-18 DIAGNOSIS — I4892 Unspecified atrial flutter: Secondary | ICD-10-CM | POA: Diagnosis present

## 2022-08-18 DIAGNOSIS — Z7901 Long term (current) use of anticoagulants: Secondary | ICD-10-CM | POA: Diagnosis not present

## 2022-08-18 DIAGNOSIS — C189 Malignant neoplasm of colon, unspecified: Secondary | ICD-10-CM | POA: Diagnosis not present

## 2022-08-18 DIAGNOSIS — I5033 Acute on chronic diastolic (congestive) heart failure: Secondary | ICD-10-CM | POA: Diagnosis present

## 2022-08-18 DIAGNOSIS — S2241XD Multiple fractures of ribs, right side, subsequent encounter for fracture with routine healing: Secondary | ICD-10-CM | POA: Diagnosis not present

## 2022-08-18 DIAGNOSIS — F17220 Nicotine dependence, chewing tobacco, uncomplicated: Secondary | ICD-10-CM | POA: Diagnosis present

## 2022-08-18 DIAGNOSIS — N189 Chronic kidney disease, unspecified: Secondary | ICD-10-CM | POA: Diagnosis not present

## 2022-08-18 DIAGNOSIS — I252 Old myocardial infarction: Secondary | ICD-10-CM | POA: Diagnosis not present

## 2022-08-18 DIAGNOSIS — K2970 Gastritis, unspecified, without bleeding: Secondary | ICD-10-CM | POA: Diagnosis not present

## 2022-08-18 DIAGNOSIS — Z87891 Personal history of nicotine dependence: Secondary | ICD-10-CM | POA: Diagnosis not present

## 2022-08-18 DIAGNOSIS — M7989 Other specified soft tissue disorders: Secondary | ICD-10-CM | POA: Diagnosis present

## 2022-08-18 DIAGNOSIS — I4819 Other persistent atrial fibrillation: Secondary | ICD-10-CM | POA: Diagnosis present

## 2022-08-18 DIAGNOSIS — E785 Hyperlipidemia, unspecified: Secondary | ICD-10-CM | POA: Diagnosis present

## 2022-08-18 DIAGNOSIS — I5023 Acute on chronic systolic (congestive) heart failure: Secondary | ICD-10-CM | POA: Diagnosis not present

## 2022-08-18 DIAGNOSIS — K5730 Diverticulosis of large intestine without perforation or abscess without bleeding: Secondary | ICD-10-CM | POA: Diagnosis not present

## 2022-08-18 DIAGNOSIS — I480 Paroxysmal atrial fibrillation: Secondary | ICD-10-CM | POA: Diagnosis not present

## 2022-08-18 DIAGNOSIS — N289 Disorder of kidney and ureter, unspecified: Secondary | ICD-10-CM | POA: Diagnosis not present

## 2022-08-18 DIAGNOSIS — D124 Benign neoplasm of descending colon: Secondary | ICD-10-CM | POA: Diagnosis present

## 2022-08-18 DIAGNOSIS — K449 Diaphragmatic hernia without obstruction or gangrene: Secondary | ICD-10-CM | POA: Diagnosis not present

## 2022-08-18 DIAGNOSIS — I130 Hypertensive heart and chronic kidney disease with heart failure and stage 1 through stage 4 chronic kidney disease, or unspecified chronic kidney disease: Secondary | ICD-10-CM | POA: Diagnosis present

## 2022-08-18 DIAGNOSIS — R195 Other fecal abnormalities: Secondary | ICD-10-CM | POA: Diagnosis not present

## 2022-08-18 DIAGNOSIS — E876 Hypokalemia: Secondary | ICD-10-CM | POA: Diagnosis present

## 2022-08-18 DIAGNOSIS — G4733 Obstructive sleep apnea (adult) (pediatric): Secondary | ICD-10-CM | POA: Diagnosis not present

## 2022-08-18 DIAGNOSIS — D630 Anemia in neoplastic disease: Secondary | ICD-10-CM | POA: Diagnosis not present

## 2022-08-18 DIAGNOSIS — J479 Bronchiectasis, uncomplicated: Secondary | ICD-10-CM | POA: Diagnosis present

## 2022-08-18 DIAGNOSIS — N1831 Chronic kidney disease, stage 3a: Secondary | ICD-10-CM | POA: Diagnosis present

## 2022-08-18 DIAGNOSIS — F330 Major depressive disorder, recurrent, mild: Secondary | ICD-10-CM | POA: Diagnosis present

## 2022-08-18 DIAGNOSIS — Z6829 Body mass index (BMI) 29.0-29.9, adult: Secondary | ICD-10-CM | POA: Diagnosis not present

## 2022-08-18 DIAGNOSIS — I129 Hypertensive chronic kidney disease with stage 1 through stage 4 chronic kidney disease, or unspecified chronic kidney disease: Secondary | ICD-10-CM | POA: Diagnosis not present

## 2022-08-18 DIAGNOSIS — I160 Hypertensive urgency: Secondary | ICD-10-CM | POA: Diagnosis not present

## 2022-08-18 DIAGNOSIS — N179 Acute kidney failure, unspecified: Secondary | ICD-10-CM | POA: Diagnosis present

## 2022-08-18 DIAGNOSIS — D500 Iron deficiency anemia secondary to blood loss (chronic): Secondary | ICD-10-CM | POA: Diagnosis present

## 2022-08-18 DIAGNOSIS — D631 Anemia in chronic kidney disease: Secondary | ICD-10-CM | POA: Diagnosis not present

## 2022-08-18 DIAGNOSIS — D649 Anemia, unspecified: Secondary | ICD-10-CM | POA: Diagnosis present

## 2022-08-18 DIAGNOSIS — R140 Abdominal distension (gaseous): Secondary | ICD-10-CM | POA: Diagnosis present

## 2022-08-18 DIAGNOSIS — C183 Malignant neoplasm of hepatic flexure: Secondary | ICD-10-CM | POA: Diagnosis present

## 2022-08-18 DIAGNOSIS — K5710 Diverticulosis of small intestine without perforation or abscess without bleeding: Secondary | ICD-10-CM | POA: Diagnosis not present

## 2022-08-18 DIAGNOSIS — E8809 Other disorders of plasma-protein metabolism, not elsewhere classified: Secondary | ICD-10-CM | POA: Diagnosis not present

## 2022-08-18 DIAGNOSIS — I428 Other cardiomyopathies: Secondary | ICD-10-CM | POA: Diagnosis present

## 2022-08-18 DIAGNOSIS — F1290 Cannabis use, unspecified, uncomplicated: Secondary | ICD-10-CM | POA: Diagnosis present

## 2022-08-18 DIAGNOSIS — D509 Iron deficiency anemia, unspecified: Secondary | ICD-10-CM | POA: Diagnosis not present

## 2022-08-18 DIAGNOSIS — I2510 Atherosclerotic heart disease of native coronary artery without angina pectoris: Secondary | ICD-10-CM | POA: Diagnosis not present

## 2022-08-18 DIAGNOSIS — K635 Polyp of colon: Secondary | ICD-10-CM | POA: Diagnosis not present

## 2022-08-18 DIAGNOSIS — E669 Obesity, unspecified: Secondary | ICD-10-CM | POA: Diagnosis present

## 2022-08-18 LAB — TYPE AND SCREEN: ABO/RH(D): O POS

## 2022-08-18 LAB — CBC
HCT: 29.7 % — ABNORMAL LOW (ref 39.0–52.0)
Hemoglobin: 9 g/dL — ABNORMAL LOW (ref 13.0–17.0)
MCH: 25.9 pg — ABNORMAL LOW (ref 26.0–34.0)
MCHC: 30.3 g/dL (ref 30.0–36.0)
MCV: 85.3 fL (ref 80.0–100.0)
Platelets: 237 10*3/uL (ref 150–400)
RBC: 3.48 MIL/uL — ABNORMAL LOW (ref 4.22–5.81)
RDW: 17.5 % — ABNORMAL HIGH (ref 11.5–15.5)
WBC: 8.8 10*3/uL (ref 4.0–10.5)
nRBC: 0 % (ref 0.0–0.2)

## 2022-08-18 LAB — COMPREHENSIVE METABOLIC PANEL
ALT: 14 U/L (ref 0–44)
AST: 17 U/L (ref 15–41)
Albumin: 3.4 g/dL — ABNORMAL LOW (ref 3.5–5.0)
Alkaline Phosphatase: 67 U/L (ref 38–126)
Anion gap: 10 (ref 5–15)
BUN: 22 mg/dL (ref 8–23)
CO2: 26 mmol/L (ref 22–32)
Calcium: 8.5 mg/dL — ABNORMAL LOW (ref 8.9–10.3)
Chloride: 104 mmol/L (ref 98–111)
Creatinine, Ser: 2.02 mg/dL — ABNORMAL HIGH (ref 0.61–1.24)
GFR, Estimated: 35 mL/min — ABNORMAL LOW (ref >60)
Glucose, Bld: 92 mg/dL (ref 70–99)
Potassium: 4.4 mmol/L (ref 3.5–5.1)
Sodium: 140 mmol/L (ref 135–145)
Total Bilirubin: 0.7 mg/dL (ref 0.3–1.2)
Total Protein: 6.4 g/dL — ABNORMAL LOW (ref 6.5–8.1)

## 2022-08-18 LAB — BPAM RBC: Blood Product Expiration Date: 202407202359

## 2022-08-18 LAB — ECHOCARDIOGRAM LIMITED
Area-P 1/2: 3.2 cm2
Est EF: 50
Height: 70 in
S' Lateral: 4.3 cm
Single Plane A4C EF: 43.6 %
Weight: 3463.87 oz

## 2022-08-18 MED ORDER — PEG 3350-KCL-NA BICARB-NACL 420 G PO SOLR
4000.0000 mL | Freq: Once | ORAL | Status: AC
  Administered 2022-08-19: 4000 mL via ORAL
  Filled 2022-08-18: qty 4000

## 2022-08-18 MED ORDER — SODIUM CHLORIDE 0.9 % IV SOLN
INTRAVENOUS | Status: DC
  Administered 2022-08-23: 10 mL/h via INTRAVENOUS

## 2022-08-18 MED ORDER — FUROSEMIDE 10 MG/ML IJ SOLN: 20.0000 mg | Freq: Two times a day (BID) | INTRAMUSCULAR | Status: DC

## 2022-08-18 MED ORDER — FUROSEMIDE 10 MG/ML IJ SOLN
40.0000 mg | Freq: Two times a day (BID) | INTRAMUSCULAR | Status: DC
  Administered 2022-08-18 – 2022-08-20 (×5): 40 mg via INTRAVENOUS
  Filled 2022-08-18 (×5): qty 4

## 2022-08-18 MED ORDER — FUROSEMIDE 10 MG/ML IJ SOLN: 40.0000 mg | Freq: Two times a day (BID) | INTRAMUSCULAR | Status: DC

## 2022-08-18 MED ORDER — POLYETHYLENE GLYCOL 3350 17 GM/SCOOP PO POWD
1.0000 | Freq: Once | ORAL | Status: AC
  Administered 2022-08-18: 255 g via ORAL
  Filled 2022-08-18: qty 255

## 2022-08-18 NOTE — Progress Notes (Signed)
Progress Note  Patient Name: Brendan Butler Date of Encounter: 08/18/2022  Primary Cardiologist: Charlton Haws, MD     Patient Profile     72 y.o. male admitted with shortness of breath 6/21 with a recent medical history including recurrent cardioversions for atrial flutter 3/24 and 4/24, coronary artery disease with multiple PCI's, most recently 2015, HFrEF (EF 20-25% 3/24 << 55-60 11/24) lung nodules concerning for lung cancer 2/24 and acute GI bleeding 6/24 with a hemoglobin down to 6 Has had issues with bradycardia including pauses up to 5.8 seconds on a Zio patch prompting the discontinuation of beta-blockers resuming of amiodarone but EP recommendation was to avoid pacing because of issues of compliance (Monitor is only incompletely available for review, 1 pause available for review occurs in the context of atrial flutter; other tracings show atrial fibrillation)  Hemoglobin 7.3, troponin negative, BNP negative, D-dimer negative. Dyspnea thought secondary to anemia less so heart failure; rX WITH DIURETICS >>TRANSFUSION   Sinus bradycardia into the 40/ 50s, amiodarone dose was decreased    Subjective   Feels better less shortness of breath ambulating some  Inpatient Medications    Scheduled Meds:  sodium chloride   Intravenous Once   amiodarone  100 mg Oral Daily   buPROPion  150 mg Oral Daily   empagliflozin  10 mg Oral Daily   furosemide  40 mg Intravenous Q12H   pantoprazole  40 mg Oral Daily   PARoxetine  30 mg Oral Daily   pregabalin  150 mg Oral TID   QUEtiapine  25 mg Oral QHS   rosuvastatin  20 mg Oral Daily   sodium chloride flush  3 mL Intravenous Q12H   Continuous Infusions:  PRN Meds: acetaminophen **OR** acetaminophen, mouth rinse, polyethylene glycol   Vital Signs    Vitals:   08/17/22 1936 08/17/22 2042 08/18/22 0025 08/18/22 0647  BP: (!) 129/51 (!) 135/50 (!) 136/45 (!) 123/53  Pulse: (!) 49 (!) 45 (!) 50 70  Resp: 18 (!) 21 18 18   Temp:  (!) 97 F (36.1 C) (!) 97.5 F (36.4 C) 98.2 F (36.8 C) 98.2 F (36.8 C)  TempSrc: Oral Oral Oral Oral  SpO2: 96% 98%    Weight:      Height:        Intake/Output Summary (Last 24 hours) at 08/18/2022 0855 Last data filed at 08/18/2022 0500 Gross per 24 hour  Intake 590 ml  Output 2600 ml  Net -2010 ml   Filed Weights   08/17/22 0912 08/17/22 1600  Weight: 100.2 kg 98.2 kg    Telemetry    Sinus rhythm with heart rates in the 50s- Personally Reviewed  ECG       Physical Exam    GEN: No acute distress.   Neck: No JVD Cardiac: RRR, no murmurs, rubs, or gallops.  Respiratory: Decreased breath sounds GI: Soft, nontender, non-distended  MS: No edema; No deformity. Neuro:  Nonfocal  Psych: Normal affect   Labs    Chemistry Recent Labs  Lab 08/17/22 1011 08/17/22 1619 08/18/22 0055  NA 138  --  140  K 3.8  --  4.4  CL 104  --  104  CO2 25  --  26  GLUCOSE 102*  --  92  BUN 23  --  22  CREATININE 1.99*  --  2.02*  CALCIUM 8.3*  --  8.5*  PROT  --  6.6 6.4*  ALBUMIN  --  3.6 3.4*  AST  --  16 17  ALT  --  15 14  ALKPHOS  --  73 67  BILITOT  --  0.7 0.7  GFRNONAA 36*  --  35*  ANIONGAP 9  --  10     Hematology Recent Labs  Lab 08/17/22 1011 08/17/22 1619 08/18/22 0055  WBC 9.5  --  8.8  RBC 2.84*  --  3.48*  HGB 7.3* 7.8* 9.0*  HCT 24.3* 26.4* 29.7*  MCV 85.6  --  85.3  MCH 25.7*  --  25.9*  MCHC 30.0  --  30.3  RDW 17.2*  --  17.5*  PLT 223  --  237    Cardiac EnzymesNo results for input(s): "TROPONINI" in the last 168 hours. No results for input(s): "TROPIPOC" in the last 168 hours.   BNP Recent Labs  Lab 08/17/22 1013  BNP 26.9     DDimer  Recent Labs  Lab 08/17/22 1017  DDIMER 0.30     Radiology    ECHOCARDIOGRAM LIMITED  Result Date: 08/18/2022    ECHOCARDIOGRAM REPORT   Patient Name:   Brendan Butler Date of Exam: 08/17/2022 Medical Rec #:  [id number]        Height:       70.0 in Accession #:    [id number]        Weight:       216.5 lb Date of Birth:  27-Jul-1950       BSA:          2.159 m Patient Age:    71 years         BP:           147/53 mmHg Patient Gender: M                HR:           46 bpm. Exam Location:  Inpatient Procedure: Limited Echo, Color Doppler and Cardiac Doppler Indications:    dyspnea  History:        Patient has prior history of Echocardiogram examinations. CHF,                 CAD, chronic kidney disease; Risk Factors:Hypertension,                 Dyslipidemia and Sleep Apnea.  Sonographer:    Delcie Roch RDCS Referring Phys: [id number] Cecille Po MELVIN IMPRESSIONS  1. Left ventricular ejection fraction, by estimation, is 50%. The left ventricle has low normal function. The left ventricle demonstrates regional wall motion abnormalities (see scoring diagram/findings for description). Left ventricular diastolic parameters were grossly normal.  2. Right ventricular systolic function is normal. The right ventricular size is normal. There is normal pulmonary artery systolic pressure. The estimated right ventricular systolic pressure is 20.8 mmHg.  3. The mitral valve is grossly normal. Trivial mitral valve regurgitation.  4. The aortic valve is grossly normal. Aortic valve regurgitation is trivial. No aortic stenosis is present.  5. The inferior vena cava is normal in size with greater than 50% respiratory variability, suggesting right atrial pressure of 3 mmHg. FINDINGS  Left Ventricle: Left ventricular ejection fraction, by estimation, is 50%. The left ventricle has low normal function. The left ventricle demonstrates regional wall motion abnormalities. The left ventricular internal cavity size was normal in size. There is no left ventricular hypertrophy. Left ventricular diastolic parameters were normal.  LV Wall Scoring: The mid anteroseptal segment, mid inferoseptal segment, and basal inferoseptal segment are hypokinetic. Right Ventricle: The right  ventricular size is normal. No increase in  right ventricular wall thickness. Right ventricular systolic function is normal. There is normal pulmonary artery systolic pressure. The tricuspid regurgitant velocity is 2.11 m/s, and  with an assumed right atrial pressure of 3 mmHg, the estimated right ventricular systolic pressure is 20.8 mmHg. Left Atrium: Left atrial size was normal in size. Right Atrium: Right atrial size was normal in size. Pericardium: There is no evidence of pericardial effusion. Mitral Valve: The mitral valve is grossly normal. Trivial mitral valve regurgitation. Tricuspid Valve: The tricuspid valve is grossly normal. Tricuspid valve regurgitation is trivial. Aortic Valve: The aortic valve is grossly normal. Aortic valve regurgitation is trivial. No aortic stenosis is present. Pulmonic Valve: The pulmonic valve was grossly normal. Pulmonic valve regurgitation is trivial. Aorta: The aortic root is normal in size and structure. Venous: The inferior vena cava is normal in size with greater than 50% respiratory variability, suggesting right atrial pressure of 3 mmHg. IAS/Shunts: The interatrial septum was not assessed.  LEFT VENTRICLE PLAX 2D LVIDd:         6.10 cm      Diastology LVIDs:         4.30 cm      LV e' medial:    7.18 cm/s LV PW:         1.00 cm      LV E/e' medial:  12.3 LV IVS:        0.90 cm      LV e' lateral:   12.50 cm/s LVOT diam:     2.20 cm      LV E/e' lateral: 7.0 LV SV:         97 LV SV Index:   45 LVOT Area:     3.80 cm  LV Volumes (MOD) LV vol d, MOD A4C: 118.0 ml LV vol s, MOD A4C: 66.5 ml LV SV MOD A4C:     118.0 ml IVC IVC diam: 1.20 cm LEFT ATRIUM         Index LA diam:    4.60 cm 2.13 cm/m  AORTIC VALVE LVOT Vmax:   104.00 cm/s LVOT Vmean:  66.600 cm/s LVOT VTI:    0.254 m  AORTA Ao Asc diam: 3.60 cm MITRAL VALVE               TRICUSPID VALVE MV Area (PHT): 3.20 cm    TR Peak grad:   17.8 mmHg MV Decel Time: 237 msec    TR Vmax:        211.00 cm/s MV E velocity: 88.00 cm/s MV A velocity: 88.00 cm/s  SHUNTS MV  E/A ratio:  1.00        Systemic VTI:  0.25 m                            Systemic Diam: 2.20 cm Weston Brass MD Electronically signed by Weston Brass MD Signature Date/Time: 08/18/2022/6:33:02 AM    Final    VAS [place] ABI WITH/WO TBI  Result Date: 08/17/2022  LOWER EXTREMITY DOPPLER STUDY Patient Name:  Brendan Butler  Date of Exam:   08/17/2022 Medical Rec #: [id number]         Accession #:    [id number] Date of Birth: [birth year]        Patient Gender: M Patient Age:   52 years Exam Location:  [facility] Procedure:      VAS [place] ABI  WITH/WO TBI Referring Phys: DANIELLE RAY --------------------------------------------------------------------------------  Indications: Rest pain. High Risk Factors: Hypertension, hyperlipidemia, coronary artery disease.  Comparison Study: No previous study. Performing Technologist: McKayla Maag RVT, [place]  Examination Guidelines: A complete evaluation includes at minimum, Doppler waveform signals and systolic blood pressure reading at the level of bilateral brachial, anterior tibial, and posterior tibial arteries, when vessel segments are accessible. Bilateral testing is considered an integral part of a complete examination. Photoelectric Plethysmograph (PPG) waveforms and toe systolic pressure readings are included as required and additional duplex testing as needed. Limited examinations for reoccurring indications may be performed as noted.  ABI Findings: +---------+------------------+-----+-----------+-------------------------------+ Right    Rt Pressure (mmHg)IndexWaveform   Comment                         +---------+------------------+-----+-----------+-------------------------------+ Brachial                        triphasic  No pressure obtained due to IV                                             placement                       +---------+------------------+-----+-----------+-------------------------------+ PTA      146               1.24  multiphasic                                +---------+------------------+-----+-----------+-------------------------------+ DP       143               1.21 multiphasic                                +---------+------------------+-----+-----------+-------------------------------+ Great Toe99                0.84 Normal                                     +---------+------------------+-----+-----------+-------------------------------+ +---------+------------------+-----+-----------+-------+ Left     Lt Pressure (mmHg)IndexWaveform   Comment +---------+------------------+-----+-----------+-------+ Brachial 118                    triphasic          +---------+------------------+-----+-----------+-------+ PTA      121               1.03 multiphasic        +---------+------------------+-----+-----------+-------+ DP       121               1.03 multiphasic        +---------+------------------+-----+-----------+-------+ Great Toe99                0.84 Normal             +---------+------------------+-----+-----------+-------+ +-------+-----------+-----------+------------+------------+ ABI/TBIToday's ABIToday's TBIPrevious ABIPrevious TBI +-------+-----------+-----------+------------+------------+ Right  1.24       0.84                                +-------+-----------+-----------+------------+------------+  Left   1.03       0.84                                +-------+-----------+-----------+------------+------------+  Summary: Right: Resting right ankle-brachial index is within normal range. The right toe-brachial index is normal. Left: Resting left ankle-brachial index is within normal range. The left toe-brachial index is normal. *See table(s) above for measurements and observations.  Electronically signed by Waverly Ferrari MD on 08/17/2022 at 5:06:18 PM.    Final    VAS [place] LOWER EXTREMITY VENOUS (DVT) (7a-7p)  Result Date: 08/17/2022  Lower Venous  DVT Study Patient Name:  Brendan Butler  Date of Exam:   08/17/2022 Medical Rec #: [id number]         Accession #:    [id number] Date of Birth: 1950-09-07        Patient Gender: M Patient Age:   89 years Exam Location:  [facility] Procedure:      VAS [place] LOWER EXTREMITY VENOUS (DVT) Referring Phys: Duwayne Heck RAY --------------------------------------------------------------------------------  Indications: Swelling.  Comparison Study: Previous study 07/30/22 negative. Performing Technologist: McKayla Maag RVT, [place]  Examination Guidelines: A complete evaluation includes B-mode imaging, spectral Doppler, color Doppler, and power Doppler as needed of all accessible portions of each vessel. Bilateral testing is considered an integral part of a complete examination. Limited examinations for reoccurring indications may be performed as noted. The reflux portion of the exam is performed with the patient in reverse Trendelenburg.  +---------+---------------+---------+-----------+----------+--------------+ RIGHT    CompressibilityPhasicitySpontaneityPropertiesThrombus Aging +---------+---------------+---------+-----------+----------+--------------+ CFV      Full           Yes      Yes                                 +---------+---------------+---------+-----------+----------+--------------+ SFJ      Full                                                        +---------+---------------+---------+-----------+----------+--------------+ FV Prox  Full                                                        +---------+---------------+---------+-----------+----------+--------------+ FV Mid   Full                                                        +---------+---------------+---------+-----------+----------+--------------+ FV DistalFull                                                        +---------+---------------+---------+-----------+----------+--------------+ PFV      Full                                                         +---------+---------------+---------+-----------+----------+--------------+  POP      Full           Yes      Yes                                 +---------+---------------+---------+-----------+----------+--------------+ PTV      Full                                                        +---------+---------------+---------+-----------+----------+--------------+ PERO     Full                                                        +---------+---------------+---------+-----------+----------+--------------+   +----+---------------+---------+-----------+----------+--------------+ LEFTCompressibilityPhasicitySpontaneityPropertiesThrombus Aging +----+---------------+---------+-----------+----------+--------------+ CFV Full           Yes      Yes                                 +----+---------------+---------+-----------+----------+--------------+ SFJ Full                                                        +----+---------------+---------+-----------+----------+--------------+     Summary: RIGHT: - There is no evidence of deep vein thrombosis in the lower extremity.  - No cystic structure found in the popliteal fossa.  LEFT: - No evidence of common femoral vein obstruction.  *See table(s) above for measurements and observations. Electronically signed by Waverly Ferrari MD on 08/17/2022 at 5:06:09 PM.    Final    DG Chest 2 View  Result Date: 08/17/2022 CLINICAL DATA:  Shortness of breath. EXAM: CHEST - 2 VIEW COMPARISON:  05/23/2022 FINDINGS: Mild cardiac enlargement. There is no evidence of pulmonary edema, consolidation, pneumothorax, nodule or pleural fluid. Visualized bony structures are unremarkable. IMPRESSION: Mild cardiac enlargement. Electronically Signed   By: Irish Lack M.D.   On: 08/17/2022 10:00      Assessment & Plan    Congestive heart failure acute/chronic-HFrEF class  III  Ischemic/nonischemic cardiomyopathy  GI bleeding-recurrent  Anemia-status posttransfusion  Renal insufficiency class IIIb Estimated Creatinine Clearance: 40.6 mL/min (A) (by C-G formula based on SCr of 2.02 mg/dL (H)).  Atrial fibrillation/flutter with prior cardioversion  Sinus bradycardia  AV conduction pauses  Volume status improving.  Continue diuresis.  Can transition to p.o. tomorrow; renal function stable  Heart rate better on the lower dose amiodarone.  Not sure will be sufficient.     For questions or updates, please contact [facility] Please consult [email or web address] for contact info under Cardiology/STEMI.      Signed, Sherryl Manges, MD  08/18/2022, 8:55 AM

## 2022-08-18 NOTE — Consult Note (Signed)
Referring Provider: Dr. [name] Primary Care Physician:  [name], MD Primary Gastroenterologist:  Gentry Fitz  Reason for Consultation:  Anemia  HPI: Brendan Butler is a 72 y.o. male with multiple medical problems seen for a consult due to iron deficiency anemia (Hgb 7.3; 6.7 on 6/3) and heme positive stool. Denies rectal bleeding or abdominal pain. States that he cannot tell whether his stools have a red or black look because his toilet water flushes blue. Transfused 1 U PRBCs to Hgb 9. He has not had a colonoscopy. EG 25-30% on TEE 05/2022.  Past Medical History:  Diagnosis Date   Acute blood loss anemia 07/30/2022   Acute cholecystitis 12/25/2021   Altered mental status 05/06/2022   Atrial flutter with rapid ventricular response (HCC) 05/06/2022   Basal ganglia infarction (HCC)    Bradycardia    CAD (coronary artery disease)    Chronic HFrEF (heart failure with reduced ejection fraction) (HCC)    Chronic kidney disease, stage 3 (HCC)    Erectile dysfunction    Hyperlipidemia LDL goal <70    Hypertension    OSA (obstructive sleep apnea)    No CPAP use overnight   PAF (paroxysmal atrial fibrillation) (HCC)    Paroxysmal atrial flutter (HCC)    Premature atrial contractions    Pulmonary nodules     Past Surgical History:  Procedure Laterality Date   BACK SURGERY  2007   Decompression lumbar laminectomy and micro discectomy, L4-5   BRONCHIAL BIOPSY  12/28/2021   Procedure: BRONCHIAL BIOPSIES;  Surgeon: Leslye Peer, MD;  Location: [facility] ENDOSCOPY;  Service: Pulmonary;;   BRONCHIAL BRUSHINGS  12/28/2021   Procedure: BRONCHIAL BRUSHINGS;  Surgeon: Leslye Peer, MD;  Location: [facility] ENDOSCOPY;  Service: Pulmonary;;   BRONCHIAL NEEDLE ASPIRATION BIOPSY  12/28/2021   Procedure: BRONCHIAL NEEDLE ASPIRATION BIOPSIES;  Surgeon: Leslye Peer, MD;  Location: [facility] ENDOSCOPY;  Service: Pulmonary;;   BRONCHIAL WASHINGS  12/28/2021   Procedure: BRONCHIAL WASHINGS;  Surgeon: Leslye Peer, MD;  Location: [facility] ENDOSCOPY;  Service: Pulmonary;;   CARDIAC CATHETERIZATION  2007   patent stent to LAD and normal Cors   CARDIOVERSION N/A 05/10/2022   Procedure: CARDIOVERSION;  Surgeon: Sande Rives, MD;  Location: [facility] ENDOSCOPY;  Service: Cardiovascular;  Laterality: N/A;   CARDIOVERSION N/A 05/28/2022   Procedure: CARDIOVERSION;  Surgeon: Wendall Stade, MD;  Location: [facility] ENDOSCOPY;  Service: Cardiovascular;  Laterality: N/A;   CHOLECYSTECTOMY N/A 12/26/2021   Procedure: LAPAROSCOPIC CHOLECYSTECTOMY, LAPAROSCOPIC LYSIS OF ADHESIONS GREATER THAN 1 HOUR;  Surgeon: [name], MD;  Location: [facility] OR;  Service: General;  Laterality: N/A;   CORONARY ANGIOPLASTY WITH STENT PLACEMENT  2001   to LAD   CORONARY ANGIOPLASTY WITH STENT PLACEMENT  12/29/12   STEMI with promus DES to LAD   CORONARY ANGIOPLASTY WITH STENT PLACEMENT  01/05/13   STEMI with thrombosis in stent to LAD   FIDUCIAL MARKER PLACEMENT  12/28/2021   Procedure: FIDUCIAL MARKER PLACEMENT;  Surgeon: Leslye Peer, MD;  Location: [facility] ENDOSCOPY;  Service: Pulmonary;;   LEFT HEART CATH N/A 12/29/2012   Procedure: LEFT HEART CATH;  Surgeon: Corky Crafts, MD;  Location[facility] CATH LAB;  Service: Cardiovascular;  Laterality: N/A;   LEFT HEART CATHETERIZATION WITH CORONARY ANGIOGRAM N/A 01/05/2013   Procedure: LEFT HEART CATHETERIZATION WITH CORONARY ANGIOGRAM;  Surgeon: Micheline Chapman, MD;  Location: [facility] CATH LAB;  Service: Cardiovascular;  Laterality: N/A;   LEFT HEART CATHETERIZATION WITH CORONARY ANGIOGRAM N/A 04/22/2013  Procedure: LEFT HEART CATHETERIZATION WITH CORONARY ANGIOGRAM;  Surgeon: Lennette Bihari, MD;  Location: [facility] CATH LAB;  Service: Cardiovascular;  Laterality: N/A;   PARTIAL NEPHRECTOMY Left 1975   PATIENT ONLY HAS ONE KIDNEY   PERCUTANEOUS CORONARY STENT INTERVENTION (PCI-S)  12/29/2012   Procedure: PERCUTANEOUS CORONARY STENT INTERVENTION (PCI-S);  Surgeon: Corky Crafts, MD;  Location: [facility] CATH  LAB;  Service: Cardiovascular;;   TEE WITHOUT CARDIOVERSION N/A 05/10/2022   Procedure: TRANSESOPHAGEAL ECHOCARDIOGRAM (TEE);  Surgeon: Sande Rives, MD;  Location: [facility] ENDOSCOPY;  Service: Cardiovascular;  Laterality: N/A;   TEE WITHOUT CARDIOVERSION N/A 05/28/2022   Procedure: TRANSESOPHAGEAL ECHOCARDIOGRAM (TEE);  Surgeon: Wendall Stade, MD;  Location: [facility] ENDOSCOPY;  Service: Cardiovascular;  Laterality: N/A;    Prior to Admission medications   Medication Sig Start Date End Date Taking? Authorizing Provider  amiodarone (PACERONE) 200 MG tablet Take 1 tablet (200 mg total) by mouth daily. 06/18/22  Yes Marjie Skiff E, PA-C  buPROPion (WELLBUTRIN XL) 150 MG 24 hr tablet Take 1 tablet (150 mg total) by mouth daily. 05/24/22  Yes Elgergawy, Leana Roe, MD  Cyanocobalamin (VITAMIN B-12 IJ) Inject as directed every 30 (thirty) days.   Yes [provider]  empagliflozin (JARDIANCE) 10 MG TABS tablet Take 1 tablet (10 mg total) by mouth daily. 06/18/22  Yes Marjie Skiff E, PA-C  fluticasone (FLONASE) 50 MCG/ACT nasal spray Place 1 spray into both nostrils daily as needed for allergies. 12/24/21  Yes [provider]  furosemide (LASIX) 40 MG tablet Take 1 tablet (40 mg total) by mouth daily. 06/18/22  Yes Marjie Skiff E, PA-C  pantoprazole (PROTONIX) 40 MG tablet Take 1 tablet (40 mg total) by mouth daily. 05/11/22  Yes Pokhrel, Laxman, MD  PARoxetine (PAXIL) 30 MG tablet Take 30 mg by mouth daily.   Yes [provider]  potassium chloride (KLOR-CON M) 10 MEQ tablet Take 1 tablet (10 mEq total) by mouth daily as needed (take wtih lasix only). 06/14/22  Yes Fenton, Clint R, PA  pregabalin (LYRICA) 150 MG capsule Take 1 capsule (150 mg total) by mouth 2 (two) times daily. Patient taking differently: Take 150 mg by mouth 3 (three) times daily. 05/24/22  Yes Elgergawy, Leana Roe, MD  QUEtiapine (SEROQUEL) 25 MG tablet Take 1 tablet (25 mg total) by mouth at bedtime.  05/10/22  Yes Pokhrel, Rebekah Chesterfield, MD  rivaroxaban (XARELTO) 20 MG TABS tablet Take 1 tablet (20 mg total) by mouth daily with supper. 06/18/22  Yes Marjie Skiff E, PA-C  rosuvastatin (CRESTOR) 20 MG tablet Take 1 tablet (20 mg total) by mouth daily. 06/18/22  Yes Goodrich, Callie E, PA-C  sacubitril-valsartan (ENTRESTO) 24-26 MG Take 1 tablet by mouth 2 (two) times daily. 06/18/22  Yes Marjie Skiff E, PA-C  spironolactone (ALDACTONE) 25 MG tablet Take 1/2 tablet (12.5 mg total) by mouth daily. 06/18/22  Yes Marjie Skiff E, PA-C  docusate sodium (COLACE) 100 MG capsule Take 1 capsule (100 mg total) by mouth 2 (two) times daily. Patient not taking: Reported on 08/17/2022 07/31/22 08/30/22  Jerald Kief, MD  ferrous sulfate 325 (65 FE) MG EC tablet Take 1 tablet (325 mg total) by mouth 2 (two) times daily. Patient not taking: Reported on 08/17/2022 07/31/22 08/30/22  Jerald Kief, MD    Scheduled Meds:  sodium chloride   Intravenous Once   amiodarone  100 mg Oral Daily   buPROPion  150 mg Oral Daily   empagliflozin  10 mg Oral Daily  furosemide  40 mg Intravenous Q12H   pantoprazole  40 mg Oral Daily   PARoxetine  30 mg Oral Daily   pregabalin  150 mg Oral TID   QUEtiapine  25 mg Oral QHS   rosuvastatin  20 mg Oral Daily   sodium chloride flush  3 mL Intravenous Q12H   Continuous Infusions: PRN Meds:.acetaminophen **OR** acetaminophen, mouth rinse, polyethylene glycol  Allergies as of 08/17/2022 - Review Complete 08/17/2022  Allergen Reaction Noted   Levitra [vardenafil] Other (See Comments) 01/12/2013   Aspirin Hives 09/10/2007   Lipitor [atorvastatin] Other (See Comments) 01/05/2013    Family History  Problem Relation Age of Onset   Alzheimer's disease Mother    CAD Father     Social History   Socioeconomic History   Marital status: Divorced    Spouse name: Not on file   Number of children: Not on file   Years of education: Not on file   Highest education level: Not on  file  Occupational History   Not on file  Tobacco Use   Smoking status: Former    Packs/day: 2.00    Years: 25.00    Additional pack years: 0.00    Total pack years: 50.00    Types: Cigarettes    Quit date: 2001    Years since quitting: 23.4   Smokeless tobacco: Current    Types: Chew   Tobacco comments:    Currently Dip Tobacco     Former smoker 06/14/22  Vaping Use   Vaping Use: Never used  Substance and Sexual Activity   Alcohol use: Not Currently    Comment: 2001 stopped drinking   Drug use: Yes    Types: Marijuana    Comment: 1-2 aweek 06/13/25   Sexual activity: Not on file  Other Topics Concern   Not on file  Social History Narrative   Not on file   Social Determinants of Health   Financial Resource Strain: Not on file  Food Insecurity: No Food Insecurity (05/21/2022)   Hunger Vital Sign    Worried About Running Out of Food in the Last Year: Never true    Ran Out of Food in the Last Year: Never true  Transportation Needs: No Transportation Needs (05/21/2022)   PRAPARE - Administrator, Civil Service (Medical): No    Lack of Transportation (Non-Medical): No  Physical Activity: Not on file  Stress: Not on file  Social Connections: Not on file  Intimate Partner Violence: Not At Risk (05/21/2022)   Humiliation, Afraid, Rape, and Kick questionnaire    Fear of Current or Ex-Partner: No    Emotionally Abused: No    Physically Abused: No    Sexually Abused: No    Review of Systems: All negative except as stated above in HPI.  Physical Exam: Vital signs: Vitals:   08/18/22 1200 08/18/22 1716  BP: (!) 118/50 (!) 148/40  Pulse: 68   Resp: 18   Temp: 98.3 F (36.8 C)   SpO2: 98%    Last BM Date : 08/16/22 General:   Lethargic, chronically ill-appearing, Well-developed, well-nourished, pleasant and cooperative in NAD Head: normocephalic, atraumatic Eyes: anicteric sclera ENT: oropharynx clear Neck: supple, nontender Lungs:  Clear throughout to  auscultation.   No wheezes, crackles, or rhonchi. No acute distress. Heart:  Regular rate and rhythm; no murmurs, clicks, rubs,  or gallops. Abdomen: soft, nontender, nondistended, +BS  Rectal:  Deferred Ext: no edema  GI:  Lab Results: Recent Labs  08/17/22 1011 08/17/22 1619 08/18/22 0055  WBC 9.5  --  8.8  HGB 7.3* 7.8* 9.0*  HCT 24.3* 26.4* 29.7*  PLT 223  --  237   BMET Recent Labs    08/17/22 1011 08/18/22 0055  NA 138 140  K 3.8 4.4  CL 104 104  CO2 25 26  GLUCOSE 102* 92  BUN 23 22  CREATININE 1.99* 2.02*  CALCIUM 8.3* 8.5*   LFT Recent Labs    08/17/22 1619 08/18/22 0055  PROT 6.6 6.4*  ALBUMIN 3.6 3.4*  AST 16 17  ALT 15 14  ALKPHOS 73 67  BILITOT 0.7 0.7  BILIDIR <0.1  --   IBILI NOT CALCULATED  --    PT/INR No results for input(s): "LABPROT", "INR" in the last 72 hours.   Studies/Results:  Impression/Plan: Anemia with heme positive stool in the setting of Xarelto, which is on hold. Needs a EGD/colonoscopy and with his severe CHF it will need to be done in a hospital setting. No overt bleeding to warrant an urgent evaluation and would be very hesitant to do his procedures on the weekend with decreased staffing in the unit. Will start colon prep tonight and tomorrow and schedule EGD/colon for Dr. Ewing Schlein to do Monday morning. Clear liquid diet.    LOS: 0 days   [name]  08/18/2022, 6:16 PM  Questions please call [phone]

## 2022-08-18 NOTE — Progress Notes (Addendum)
PROGRESS NOTE    Brendan Butler  ZOX:[id number] DOB: 03-Jan-1951 DOA: 08/17/2022 PCP: [name], MD  71/M with history of CAD, multiple PCI and stenting, atrial flutter, depression, hypertension, OSA, CKD 3, bronchiectasis, left nephrectomy presented to the ED with shortness of breath and edema, progressive weight gain and fatigue. -Recently admitted with worsening anemia and positive Hemoccult, transfused PRBC, declined colonoscopy then. -In the ED heart rate 40-50, creatinine 1.9, hemoglobin 7.3, chest x-ray noted mild cardiomegaly   Subjective: -Feels a little better today, breathing is improving  Assessment and Plan:  Iron deficiency anemia Heme positive stools -Recent diagnosis, had heme positive stools on admission few weeks ago, declined GI workup then, now amenable -Holding Xarelto [facility] gastroenterology consulted -Transfused 1 unit PRBC on admission, add IV iron tomorrow  Acute on chronic diastolic CHF -Last echo 6/24 with EF 50%, regional wall motion abnormalities, normal RV -Worsened in the setting of anemia, troponin is normal -Now improving, continue IV Lasix, continue Jardiance -Hold Entresto today with bump in creatinine -Follow-up repeat echo  AKI on CKD 3a -Likely hemodynamically mediated, compounded by worsening anemia -Hold Entresto today -Clinically volume overloaded, continue IV Lasix, hemoglobin improving  Lung Nodule -Concerning for adeno CA, noted on prior imaging, followed by Dr. Delton Coombes, he missed his appointment for PET scan -This will need to be rescheduled, emphasized with patient regarding importance of this workup  Atrial flutter -With bradycardia, currently on amiodarone, Xarelto on hold   CAD HLD > Status post multiple stenting > Does notably have an aspirin allergy - Continue  statin,  - Hold Xarelto as above   Depression - Continue home bupropion, paroxetine, Seroquel   Hypertension -Holding Entresto and Aldactone today    OSA - Not on CPAP per chart review   DVT prophylaxis: SCDs Code Status: Full code Family Communication: None present Disposition Plan: Home likely 2 to 3 days  Consultants:    Procedures:   Antimicrobials:    Objective: Vitals:   08/17/22 1936 08/17/22 2042 08/18/22 0025 08/18/22 0647  BP: (!) 129/51 (!) 135/50 (!) 136/45 (!) 123/53  Pulse: (!) 49 (!) 45 (!) 50 70  Resp: 18 (!) 21 18 18   Temp: (!) 97 F (36.1 C) (!) 97.5 F (36.4 C) 98.2 F (36.8 C) 98.2 F (36.8 C)  TempSrc: Oral Oral Oral Oral  SpO2: 96% 98%    Weight:      Height:        Intake/Output Summary (Last 24 hours) at 08/18/2022 1111 Last data filed at 08/18/2022 0500 Gross per 24 hour  Intake 590 ml  Output 2600 ml  Net -2010 ml   Filed Weights   08/17/22 0912 08/17/22 1600  Weight: 100.2 kg 98.2 kg    Examination:  Obese male sitting up in bed, AAOx3, no distress HEENT: Positive JVD CVS: S1-S2, regular rhythm Lungs: Few basilar rales Abdomen: Soft, nontender, bowel sounds present Extremities: 1+ edema Skin: No rashes Psychiatry:  Mood & affect appropriate.     Data Reviewed:   CBC: Recent Labs  Lab 08/17/22 1011 08/17/22 1619 08/18/22 0055  WBC 9.5  --  8.8  HGB 7.3* 7.8* 9.0*  HCT 24.3* 26.4* 29.7*  MCV 85.6  --  85.3  PLT 223  --  237   Basic Metabolic Panel: Recent Labs  Lab 08/17/22 1011 08/17/22 1017 08/18/22 0055  NA 138  --  140  K 3.8  --  4.4  CL 104  --  104  CO2 25  --  26  GLUCOSE 102*  --  92  BUN 23  --  22  CREATININE 1.99*  --  2.02*  CALCIUM 8.3*  --  8.5*  MG  --  2.4  --    GFR: Estimated Creatinine Clearance: 40.6 mL/min (A) (by C-G formula based on SCr of 2.02 mg/dL (H)). Liver Function Tests: Recent Labs  Lab 08/17/22 1619 08/18/22 0055  AST 16 17  ALT 15 14  ALKPHOS 73 67  BILITOT 0.7 0.7  PROT 6.6 6.4*  ALBUMIN 3.6 3.4*   No results for input(s): "LIPASE", "AMYLASE" in the last 168 hours. No results for input(s): "AMMONIA" in  the last 168 hours. Coagulation Profile: No results for input(s): "INR", "PROTIME" in the last 168 hours. Cardiac Enzymes: No results for input(s): "CKTOTAL", "CKMB", "CKMBINDEX", "TROPONINI" in the last 168 hours. BNP (last 3 results) No results for input(s): "PROBNP" in the last 8760 hours. HbA1C: No results for input(s): "HGBA1C" in the last 72 hours. CBG: No results for input(s): "GLUCAP" in the last 168 hours. Lipid Profile: No results for input(s): "CHOL", "HDL", "LDLCALC", "TRIG", "CHOLHDL", "LDLDIRECT" in the last 72 hours. Thyroid Function Tests: Recent Labs    08/17/22 1445  TSH 8.374*   Anemia Panel: Recent Labs    08/17/22 1017  FERRITIN 17*  TIBC 420  IRON 12*   Urine analysis:    Component Value Date/Time   COLORURINE AMBER (A) 05/20/2022 2038   APPEARANCEUR CLEAR 05/20/2022 2038   LABSPEC 1.034 (H) 05/20/2022 2038   PHURINE 5.0 05/20/2022 2038   GLUCOSEU NEGATIVE 05/20/2022 2038   HGBUR NEGATIVE 05/20/2022 2038   BILIRUBINUR NEGATIVE 05/20/2022 2038   KETONESUR NEGATIVE 05/20/2022 2038   PROTEINUR NEGATIVE 05/20/2022 2038   NITRITE NEGATIVE 05/20/2022 2038   LEUKOCYTESUR NEGATIVE 05/20/2022 2038   Sepsis Labs: @LABRCNTIP (procalcitonin:4,lacticidven:4)  )No results found for this or any previous visit (from the past 240 hour(s)).   Radiology Studies: ECHOCARDIOGRAM LIMITED  Result Date: 08/18/2022    ECHOCARDIOGRAM REPORT   Patient Name:   Brendan Butler Date of Exam: 08/17/2022 Medical Rec #:  [id number]        Height:       70.0 in Accession #:    [id number]       Weight:       216.5 lb Date of Birth:  1950-11-22       BSA:          2.159 m Patient Age:    71 years         BP:           147/53 mmHg Patient Gender: M                HR:           46 bpm. Exam Location:  Inpatient Procedure: Limited Echo, Color Doppler and Cardiac Doppler Indications:    dyspnea  History:        Patient has prior history of Echocardiogram examinations. CHF,                  CAD, chronic kidney disease; Risk Factors:Hypertension,                 Dyslipidemia and Sleep Apnea.  Sonographer:    Delcie Roch RDCS Referring Phys: [id number] Cecille Po MELVIN IMPRESSIONS  1. Left ventricular ejection fraction, by estimation, is 50%. The left ventricle has low normal function. The left ventricle demonstrates regional wall motion abnormalities (see scoring diagram/findings  for description). Left ventricular diastolic parameters were grossly normal.  2. Right ventricular systolic function is normal. The right ventricular size is normal. There is normal pulmonary artery systolic pressure. The estimated right ventricular systolic pressure is 20.8 mmHg.  3. The mitral valve is grossly normal. Trivial mitral valve regurgitation.  4. The aortic valve is grossly normal. Aortic valve regurgitation is trivial. No aortic stenosis is present.  5. The inferior vena cava is normal in size with greater than 50% respiratory variability, suggesting right atrial pressure of 3 mmHg. FINDINGS  Left Ventricle: Left ventricular ejection fraction, by estimation, is 50%. The left ventricle has low normal function. The left ventricle demonstrates regional wall motion abnormalities. The left ventricular internal cavity size was normal in size. There is no left ventricular hypertrophy. Left ventricular diastolic parameters were normal.  LV Wall Scoring: The mid anteroseptal segment, mid inferoseptal segment, and basal inferoseptal segment are hypokinetic. Right Ventricle: The right ventricular size is normal. No increase in right ventricular wall thickness. Right ventricular systolic function is normal. There is normal pulmonary artery systolic pressure. The tricuspid regurgitant velocity is 2.11 m/s, and  with an assumed right atrial pressure of 3 mmHg, the estimated right ventricular systolic pressure is 20.8 mmHg. Left Atrium: Left atrial size was normal in size. Right Atrium: Right atrial size was normal in  size. Pericardium: There is no evidence of pericardial effusion. Mitral Valve: The mitral valve is grossly normal. Trivial mitral valve regurgitation. Tricuspid Valve: The tricuspid valve is grossly normal. Tricuspid valve regurgitation is trivial. Aortic Valve: The aortic valve is grossly normal. Aortic valve regurgitation is trivial. No aortic stenosis is present. Pulmonic Valve: The pulmonic valve was grossly normal. Pulmonic valve regurgitation is trivial. Aorta: The aortic root is normal in size and structure. Venous: The inferior vena cava is normal in size with greater than 50% respiratory variability, suggesting right atrial pressure of 3 mmHg. IAS/Shunts: The interatrial septum was not assessed.  LEFT VENTRICLE PLAX 2D LVIDd:         6.10 cm      Diastology LVIDs:         4.30 cm      LV e' medial:    7.18 cm/s LV PW:         1.00 cm      LV E/e' medial:  12.3 LV IVS:        0.90 cm      LV e' lateral:   12.50 cm/s LVOT diam:     2.20 cm      LV E/e' lateral: 7.0 LV SV:         97 LV SV Index:   45 LVOT Area:     3.80 cm  LV Volumes (MOD) LV vol d, MOD A4C: 118.0 ml LV vol s, MOD A4C: 66.5 ml LV SV MOD A4C:     118.0 ml IVC IVC diam: 1.20 cm LEFT ATRIUM         Index LA diam:    4.60 cm 2.13 cm/m  AORTIC VALVE LVOT Vmax:   104.00 cm/s LVOT Vmean:  66.600 cm/s LVOT VTI:    0.254 m  AORTA Ao Asc diam: 3.60 cm MITRAL VALVE               TRICUSPID VALVE MV Area (PHT): 3.20 cm    TR Peak grad:   17.8 mmHg MV Decel Time: 237 msec    TR Vmax:        211.00  cm/s MV E velocity: 88.00 cm/s MV A velocity: 88.00 cm/s  SHUNTS MV E/A ratio:  1.00        Systemic VTI:  0.25 m                            Systemic Diam: 2.20 cm Weston Brass MD Electronically signed by Weston Brass MD Signature Date/Time: 08/18/2022/6:33:02 AM    Final    VAS [place] ABI WITH/WO TBI  Result Date: 08/17/2022  LOWER EXTREMITY DOPPLER STUDY Patient Name:  Brendan Butler  Date of Exam:   08/17/2022 Medical Rec #: [id number]          Accession #:    [id number] Date of Birth: Oct 16, 1950        Patient Gender: M Patient Age:   23 years Exam Location:  [facility] Procedure:      VAS [place] ABI WITH/WO TBI Referring Phys: Duwayne Heck RAY --------------------------------------------------------------------------------  Indications: Rest pain. High Risk Factors: Hypertension, hyperlipidemia, coronary artery disease.  Comparison Study: No previous study. Performing Technologist: McKayla Maag RVT, [place]  Examination Guidelines: A complete evaluation includes at minimum, Doppler waveform signals and systolic blood pressure reading at the level of bilateral brachial, anterior tibial, and posterior tibial arteries, when vessel segments are accessible. Bilateral testing is considered an integral part of a complete examination. Photoelectric Plethysmograph (PPG) waveforms and toe systolic pressure readings are included as required and additional duplex testing as needed. Limited examinations for reoccurring indications may be performed as noted.  ABI Findings: +---------+------------------+-----+-----------+-------------------------------+ Right    Rt Pressure (mmHg)IndexWaveform   Comment                         +---------+------------------+-----+-----------+-------------------------------+ Brachial                        triphasic  No pressure obtained due to IV                                             placement                       +---------+------------------+-----+-----------+-------------------------------+ PTA      146               1.24 multiphasic                                +---------+------------------+-----+-----------+-------------------------------+ DP       143               1.21 multiphasic                                +---------+------------------+-----+-----------+-------------------------------+ Great Toe99                0.84 Normal                                      +---------+------------------+-----+-----------+-------------------------------+ +---------+------------------+-----+-----------+-------+ Left     Lt Pressure (mmHg)IndexWaveform   Comment +---------+------------------+-----+-----------+-------+ Brachial 118  triphasic          +---------+------------------+-----+-----------+-------+ PTA      121               1.03 multiphasic        +---------+------------------+-----+-----------+-------+ DP       121               1.03 multiphasic        +---------+------------------+-----+-----------+-------+ Great Toe99                0.84 Normal             +---------+------------------+-----+-----------+-------+ +-------+-----------+-----------+------------+------------+ ABI/TBIToday's ABIToday's TBIPrevious ABIPrevious TBI +-------+-----------+-----------+------------+------------+ Right  1.24       0.84                                +-------+-----------+-----------+------------+------------+ Left   1.03       0.84                                +-------+-----------+-----------+------------+------------+  Summary: Right: Resting right ankle-brachial index is within normal range. The right toe-brachial index is normal. Left: Resting left ankle-brachial index is within normal range. The left toe-brachial index is normal. *See table(s) above for measurements and observations.  Electronically signed by Waverly Ferrari MD on 08/17/2022 at 5:06:18 PM.    Final    VAS [place] LOWER EXTREMITY VENOUS (DVT) (7a-7p)  Result Date: 08/17/2022  Lower Venous DVT Study Patient Name:  Brendan Butler  Date of Exam:   08/17/2022 Medical Rec #: [id number]         Accession #:    [id number] Date of Birth: 05/11/1950        Patient Gender: M Patient Age:   76 years Exam Location:  [facility] Procedure:      VAS [place] LOWER EXTREMITY VENOUS (DVT) Referring Phys: Duwayne Heck RAY  --------------------------------------------------------------------------------  Indications: Swelling.  Comparison Study: Previous study 07/30/22 negative. Performing Technologist: McKayla Maag RVT, [place]  Examination Guidelines: A complete evaluation includes B-mode imaging, spectral Doppler, color Doppler, and power Doppler as needed of all accessible portions of each vessel. Bilateral testing is considered an integral part of a complete examination. Limited examinations for reoccurring indications may be performed as noted. The reflux portion of the exam is performed with the patient in reverse Trendelenburg.  +---------+---------------+---------+-----------+----------+--------------+ RIGHT    CompressibilityPhasicitySpontaneityPropertiesThrombus Aging +---------+---------------+---------+-----------+----------+--------------+ CFV      Full           Yes      Yes                                 +---------+---------------+---------+-----------+----------+--------------+ SFJ      Full                                                        +---------+---------------+---------+-----------+----------+--------------+ FV Prox  Full                                                        +---------+---------------+---------+-----------+----------+--------------+  FV Mid   Full                                                        +---------+---------------+---------+-----------+----------+--------------+ FV DistalFull                                                        +---------+---------------+---------+-----------+----------+--------------+ PFV      Full                                                        +---------+---------------+---------+-----------+----------+--------------+ POP      Full           Yes      Yes                                 +---------+---------------+---------+-----------+----------+--------------+ PTV      Full                                                         +---------+---------------+---------+-----------+----------+--------------+ PERO     Full                                                        +---------+---------------+---------+-----------+----------+--------------+   +----+---------------+---------+-----------+----------+--------------+ LEFTCompressibilityPhasicitySpontaneityPropertiesThrombus Aging +----+---------------+---------+-----------+----------+--------------+ CFV Full           Yes      Yes                                 +----+---------------+---------+-----------+----------+--------------+ SFJ Full                                                        +----+---------------+---------+-----------+----------+--------------+     Summary: RIGHT: - There is no evidence of deep vein thrombosis in the lower extremity.  - No cystic structure found in the popliteal fossa.  LEFT: - No evidence of common femoral vein obstruction.  *See table(s) above for measurements and observations. Electronically signed by Waverly Ferrari MD on 08/17/2022 at 5:06:09 PM.    Final    DG Chest 2 View  Result Date: 08/17/2022 CLINICAL DATA:  Shortness of breath. EXAM: CHEST - 2 VIEW COMPARISON:  05/23/2022 FINDINGS: Mild cardiac enlargement. There is no evidence of pulmonary edema, consolidation, pneumothorax, nodule or pleural fluid. Visualized bony structures are unremarkable. IMPRESSION: Mild cardiac enlargement. Electronically Signed   By: Sherrine Maples  Fredia Sorrow M.D.   On: 08/17/2022 10:00     Scheduled Meds:  sodium chloride   Intravenous Once   amiodarone  100 mg Oral Daily   buPROPion  150 mg Oral Daily   empagliflozin  10 mg Oral Daily   furosemide  40 mg Intravenous Q12H   pantoprazole  40 mg Oral Daily   PARoxetine  30 mg Oral Daily   pregabalin  150 mg Oral TID   QUEtiapine  25 mg Oral QHS   rosuvastatin  20 mg Oral Daily   sodium chloride flush  3 mL Intravenous Q12H   Continuous  Infusions:   LOS: 0 days    Time spent:    Zannie Cove, MD [facility]   08/18/2022, 11:11 AM

## 2022-08-18 NOTE — Evaluation (Signed)
Physical Therapy Evaluation Patient Details Name: Brendan Butler MRN: [id number] DOB: 02-02-1951 Today's Date: 08/18/2022  History of Present Illness  Brendan Butler is a 72 y.o. male presenting with shortness of breath and edema. PMH:CAD status post multiple stenting, symptomatic bradycardia, atrial flutter, depression, hypertension, hyperlipidemia, OSA, CKD 3A, bronchiectasis, CHF, status post left nephrectomy, ASA allergy   Clinical Impression  Pt admitted with above. At baseline pt lives alone and is indep with all ADLs and mobilizes without AD. Pt near baseline but continues to get SOB with mobility. Pt SpO2 at 94% on RA. Acute PT to cont to follow while in hospital.       Recommendations for follow up therapy are one component of a multi-disciplinary discharge planning process, led by the attending physician.  Recommendations may be updated based on patient status, additional functional criteria and insurance authorization.  Follow Up Recommendations       Assistance Recommended at Discharge Intermittent Supervision/Assistance  Patient can return home with the following       Equipment Recommendations None recommended by PT  Recommendations for Other Services       Functional Status Assessment Patient has had a recent decline in their functional status and demonstrates the ability to make significant improvements in function in a reasonable and predictable amount of time.     Precautions / Restrictions Precautions Precautions: Fall Restrictions Weight Bearing Restrictions: No      Mobility  Bed Mobility Overal bed mobility: Independent             General bed mobility comments: HOB flat, no physical assist, pulled self into long sit then brought legs over side of EOB    Transfers Overall transfer level: Needs assistance Equipment used: None Transfers: Sit to/from Stand Sit to Stand: Min guard           General transfer comment: pt with posterior  fall backward onto bed upon initial stand    Ambulation/Gait Ambulation/Gait assistance: Min guard Gait Distance (Feet): 200 Feet Assistive device: None Gait Pattern/deviations: WFL(Within Functional Limits) Gait velocity: wfl Gait velocity interpretation: >2.62 ft/sec, indicative of community ambulatory   General Gait Details: mild SOB, wide base of support with mild antalgia, SpO2 94% on RA  Stairs Stairs: Yes Stairs assistance: Supervision Stair Management: One rail Right Number of Stairs: 3 General stair comments: no LOB  Wheelchair Mobility    Modified Rankin (Stroke Patients Only)       Balance Overall balance assessment: Mild deficits observed, not formally tested                                           Pertinent Vitals/Pain Pain Assessment Pain Assessment: No/denies pain    Home Living Family/patient expects to be discharged to:: Private residence Living Arrangements: Alone Available Help at Discharge: Family;Available 24 hours/day;Friend(s) Type of Home: House Home Access: Stairs to enter Entrance Stairs-Rails: Can reach both;Left;Right Entrance Stairs-Number of Steps: 3   Home Layout: One level Home Equipment: Agricultural consultant (2 wheels);Rollator (4 wheels);Standard Walker;Cane - single point;Wheelchair - manual;Other (comment);Grab bars - tub/shower;Hand held shower head      Prior Function Prior Level of Function : Independent/Modified Independent;Driving;History of Falls (last six months)             Mobility Comments: amb without AD ADLs Comments: indep, drives, grocery shops, works in garden  Hand Dominance   Dominant Hand: Right    Extremity/Trunk Assessment   Upper Extremity Assessment Upper Extremity Assessment: Generalized weakness    Lower Extremity Assessment Lower Extremity Assessment: Generalized weakness    Cervical / Trunk Assessment Cervical / Trunk Assessment: Normal  Communication    Communication: No difficulties  Cognition Arousal/Alertness: Awake/alert Behavior During Therapy: WFL for tasks assessed/performed Overall Cognitive Status: Within Functional Limits for tasks assessed                                 General Comments: pt was re-oriented to date of the "58" pt knew june and 2024. Pt very concerned regarding his upcoming colonoscopy        General Comments General comments (skin integrity, edema, etc.): mild SOB with amb, SpO2 at 94% on RA    Exercises     Assessment/Plan    PT Assessment Patient needs continued PT services  PT Problem List Decreased strength;Decreased activity tolerance;Decreased mobility       PT Treatment Interventions DME instruction;Gait training;Stair training;Functional mobility training;Therapeutic activities;Therapeutic exercise    PT Goals (Current goals can be found in the Care Plan section)  Acute Rehab PT Goals Patient Stated Goal: home PT Goal Formulation: With patient Time For Goal Achievement: 09/01/22 Potential to Achieve Goals: Good    Frequency Min 1X/week     Co-evaluation               AM-PAC PT "6 Clicks" Mobility  Outcome Measure Help needed turning from your back to your side while in a flat bed without using bedrails?: None Help needed moving from lying on your back to sitting on the side of a flat bed without using bedrails?: None Help needed moving to and from a bed to a chair (including a wheelchair)?: None Help needed standing up from a chair using your arms (e.g., wheelchair or bedside chair)?: A Little Help needed to walk in hospital room?: A Little Help needed climbing 3-5 steps with a railing? : A Little 6 Click Score: 21    End of Session   Activity Tolerance: Patient tolerated treatment well Patient left: in chair;with call bell/phone within reach;with chair alarm set Nurse Communication: Mobility status (emptied urine from urinal) PT Visit Diagnosis:  Unsteadiness on feet (R26.81);Muscle weakness (generalized) (M62.81);Difficulty in walking, not elsewhere classified (R26.2)    Time: 1204-1227 PT Time Calculation (min) (ACUTE ONLY): 23 min   Charges:   PT Evaluation $PT Eval Moderate Complexity: 1 Mod PT Treatments $Gait Training: 8-22 mins        Lewis Shock, PT, DPT Acute Rehabilitation Services Secure chat preferred Office #: [phone]   Iona Hansen 08/18/2022, 1:23 PM

## 2022-08-19 DIAGNOSIS — R195 Other fecal abnormalities: Secondary | ICD-10-CM | POA: Diagnosis not present

## 2022-08-19 DIAGNOSIS — D649 Anemia, unspecified: Secondary | ICD-10-CM | POA: Diagnosis not present

## 2022-08-19 DIAGNOSIS — I5023 Acute on chronic systolic (congestive) heart failure: Secondary | ICD-10-CM | POA: Diagnosis not present

## 2022-08-19 DIAGNOSIS — N289 Disorder of kidney and ureter, unspecified: Secondary | ICD-10-CM

## 2022-08-19 DIAGNOSIS — Z7901 Long term (current) use of anticoagulants: Secondary | ICD-10-CM | POA: Diagnosis not present

## 2022-08-19 LAB — CBC
HCT: 30.7 % — ABNORMAL LOW (ref 39.0–52.0)
Hemoglobin: 9.5 g/dL — ABNORMAL LOW (ref 13.0–17.0)
MCH: 25.2 pg — ABNORMAL LOW (ref 26.0–34.0)
MCHC: 30.9 g/dL (ref 30.0–36.0)
MCV: 81.4 fL (ref 80.0–100.0)
Platelets: 267 10*3/uL (ref 150–400)
RBC: 3.77 MIL/uL — ABNORMAL LOW (ref 4.22–5.81)
RDW: 17.7 % — ABNORMAL HIGH (ref 11.5–15.5)
WBC: 9.8 10*3/uL (ref 4.0–10.5)
nRBC: 0 % (ref 0.0–0.2)

## 2022-08-19 LAB — BASIC METABOLIC PANEL
Anion gap: 11 (ref 5–15)
BUN: 22 mg/dL (ref 8–23)
CO2: 26 mmol/L (ref 22–32)
Calcium: 8.7 mg/dL — ABNORMAL LOW (ref 8.9–10.3)
Chloride: 101 mmol/L (ref 98–111)
Creatinine, Ser: 1.81 mg/dL — ABNORMAL HIGH (ref 0.61–1.24)
GFR, Estimated: 40 mL/min — ABNORMAL LOW (ref >60)
Glucose, Bld: 92 mg/dL (ref 70–99)
Potassium: 3.8 mmol/L (ref 3.5–5.1)
Sodium: 138 mmol/L (ref 135–145)

## 2022-08-19 MED ORDER — SODIUM CHLORIDE 0.9 % IV SOLN
12.5000 mg | Freq: Four times a day (QID) | INTRAVENOUS | Status: DC | PRN
  Administered 2022-08-19: 12.5 mg via INTRAVENOUS
  Filled 2022-08-19: qty 12.5

## 2022-08-19 MED ORDER — SODIUM CHLORIDE 0.9 % IV SOLN
250.0000 mg | Freq: Every day | INTRAVENOUS | Status: AC
  Administered 2022-08-19 – 2022-08-22 (×4): 250 mg via INTRAVENOUS
  Filled 2022-08-19 (×4): qty 20

## 2022-08-19 MED ORDER — ONDANSETRON HCL 4 MG/2ML IJ SOLN
4.0000 mg | Freq: Once | INTRAMUSCULAR | Status: AC
  Administered 2022-08-19: 4 mg via INTRAVENOUS
  Filled 2022-08-19: qty 2

## 2022-08-19 MED ORDER — ONDANSETRON HCL 4 MG/2ML IJ SOLN
4.0000 mg | Freq: Three times a day (TID) | INTRAMUSCULAR | Status: DC | PRN
  Administered 2022-08-23: 4 mg via INTRAVENOUS
  Filled 2022-08-19: qty 2

## 2022-08-19 NOTE — Progress Notes (Signed)
Mobility Specialist Progress Note    08/19/22 1128  Mobility  Activity Ambulated with assistance in hallway  Level of Assistance Standby assist, set-up cues, supervision of patient - no hands on  Assistive Device Other (Comment) (IV pole)  Distance Ambulated (ft) 450 ft  Activity Response Tolerated well  Mobility Referral Yes  $Mobility charge 1 Mobility  Mobility Specialist Start Time (ACUTE ONLY) 1117  Mobility Specialist Stop Time (ACUTE ONLY) 1127  Mobility Specialist Time Calculation (min) (ACUTE ONLY) 10 min   Pre-Mobility: 57 HR, 95% SpO2 During Mobility: 77 HR, 94-97% SpO2 Post-Mobility: 73 HR  Pt received in bed and agreeable. No complaints on walk. Returned to sitting EOB with call bell in reach.   [place] Mobility Specialist  Please Neurosurgeon or Rehab Office at [phone]

## 2022-08-19 NOTE — Progress Notes (Signed)
[facility] Gastroenterology Progress Note  Brendan Butler 72 y.o. 03/09/1950   Subjective: Feels nauseous. Denies abdominal pain.   Objective: Vital signs: Vitals:   08/19/22 0400 08/19/22 0732  BP: (!) 130/49 (!) 155/53  Pulse: (!) 57 (!) 53  Resp: 17 18  Temp:  97.9 F (36.6 C)  SpO2: 98% 91%    Physical Exam: Gen: lethargic, well-nourished, no acute distress  HEENT: anicteric sclera CV: RRR Chest: CTA B Abd: soft, nontender, nondistended, +BS Ext: no edema  Lab Results: Recent Labs    08/17/22 1017 08/18/22 0055 08/19/22 0036  NA  --  140 138  K  --  4.4 3.8  CL  --  104 101  CO2  --  26 26  GLUCOSE  --  92 92  BUN  --  22 22  CREATININE  --  2.02* 1.81*  CALCIUM  --  8.5* 8.7*  MG 2.4  --   --    Recent Labs    08/17/22 1619 08/18/22 0055  AST 16 17  ALT 15 14  ALKPHOS 73 67  BILITOT 0.7 0.7  PROT 6.6 6.4*  ALBUMIN 3.6 3.4*   Recent Labs    08/18/22 0055 08/19/22 0036  WBC 8.8 9.8  HGB 9.0* 9.5*  HCT 29.7* 30.7*  MCV 85.3 81.4  PLT 237 267      Assessment/Plan: Anemia with heme positive stool in setting of Xarelto. EGD/colon tomorrow if he can drink colon prep today. Antiemetics. Clear liquid diet. Supportive care.   Brendan Butler 08/19/2022, 9:15 AM  Questions please call [phone] ID: Brendan Butler, male   DOB: 24-Dec-1950, 72 y.o.   MRN: [id number]

## 2022-08-19 NOTE — Progress Notes (Signed)
Progress Note  Patient Name: Brendan Butler Date of Encounter: 08/19/2022  Primary Cardiologist: Charlton Haws, MD     Patient Profile     72 y.o. male admitted with shortness of breath 6/21 with a recent medical history including recurrent cardioversions for atrial flutter 3/24 and 4/24, coronary artery disease with multiple PCI's, most recently 2015, HFrEF (EF 20-25% 3/24 << 55-60 11/24) lung nodules concerning for lung cancer 2/24 and acute GI bleeding 6/24 with a hemoglobin down to 6  Has had issues with bradycardia including pauses up to 5.8 seconds on a Zio patch prompting the discontinuation of beta-blockers resuming of amiodarone but EP recommendation was to avoid pacing because of issues of compliance (Monitor is only incompletely available for review, 1 pause available for review occurs in the context of atrial flutter; other tracings show atrial fibrillation)  Hemoglobin 7.3, troponin negative, BNP negative, D-dimer negative. Dyspnea thought secondary to anemia less so heart failure; rX WITH DIURETICS >>TRANSFUSION   Sinus bradycardia into the 40/ 50s, amiodarone dose was decreased    Subjective   No sob, for colonoscopy in AM   Inpatient Medications    Scheduled Meds:  sodium chloride   Intravenous Once   amiodarone  100 mg Oral Daily   buPROPion  150 mg Oral Daily   empagliflozin  10 mg Oral Daily   furosemide  40 mg Intravenous Q12H   pantoprazole  40 mg Oral Daily   PARoxetine  30 mg Oral Daily   polyethylene glycol-electrolytes  4,000 mL Oral Once   pregabalin  150 mg Oral TID   QUEtiapine  25 mg Oral QHS   rosuvastatin  20 mg Oral Daily   sodium chloride flush  3 mL Intravenous Q12H   Continuous Infusions:  sodium chloride     ferric gluconate (FERRLECIT) IVPB     PRN Meds: acetaminophen **OR** acetaminophen, ondansetron (ZOFRAN) IV, mouth rinse, polyethylene glycol   Vital Signs    Vitals:   08/19/22 0005 08/19/22 0400 08/19/22 0600 08/19/22  0732  BP: (!) 107/50 (!) 130/49  (!) 155/53  Pulse: (!) 55 (!) 57  (!) 53  Resp: 19 17  18   Temp:    97.9 F (36.6 C)  TempSrc:      SpO2: 95% 98%  91%  Weight:   97.7 kg   Height:        Intake/Output Summary (Last 24 hours) at 08/19/2022 1044 Last data filed at 08/19/2022 0803 Gross per 24 hour  Intake 260 ml  Output 3000 ml  Net -2740 ml    Filed Weights   08/17/22 0912 08/17/22 1600 08/19/22 0600  Weight: 100.2 kg 98.2 kg 97.7 kg    Telemetry    Sinus - Personally Reviewed  ECG       Physical Exam    Well developed and nourished in no acute distress HENT normal Neck supple with JVP  Clear Regular rate and rhythm, no murmurs or gallops Abd-soft with active BS No Clubbing cyanosis edema Skin-warm and dry A & Oriented  Grossly normal sensory and motor function       Labs    Chemistry Recent Labs  Lab 08/17/22 1011 08/17/22 1619 08/18/22 0055 08/19/22 0036  NA 138  --  140 138  K 3.8  --  4.4 3.8  CL 104  --  104 101  CO2 25  --  26 26  GLUCOSE 102*  --  92 92  BUN 23  --  22  22  CREATININE 1.99*  --  2.02* 1.81*  CALCIUM 8.3*  --  8.5* 8.7*  PROT  --  6.6 6.4*  --   ALBUMIN  --  3.6 3.4*  --   AST  --  16 17  --   ALT  --  15 14  --   ALKPHOS  --  73 67  --   BILITOT  --  0.7 0.7  --   GFRNONAA 36*  --  35* 40*  ANIONGAP 9  --  10 11      Hematology Recent Labs  Lab 08/17/22 1011 08/17/22 1619 08/18/22 0055 08/19/22 0036  WBC 9.5  --  8.8 9.8  RBC 2.84*  --  3.48* 3.77*  HGB 7.3* 7.8* 9.0* 9.5*  HCT 24.3* 26.4* 29.7* 30.7*  MCV 85.6  --  85.3 81.4  MCH 25.7*  --  25.9* 25.2*  MCHC 30.0  --  30.3 30.9  RDW 17.2*  --  17.5* 17.7*  PLT 223  --  237 267     Cardiac EnzymesNo results for input(s): "TROPONINI" in the last 168 hours. No results for input(s): "TROPIPOC" in the last 168 hours.   BNP Recent Labs  Lab 08/17/22 1013  BNP 26.9      DDimer  Recent Labs  Lab 08/17/22 1017  DDIMER 0.30      Radiology     ECHOCARDIOGRAM LIMITED  Result Date: 08/18/2022    ECHOCARDIOGRAM REPORT   Patient Name:   Brendan Butler Date of Exam: 08/17/2022 Medical Rec #:  [id number]        Height:       70.0 in Accession #:    [id number]       Weight:       216.5 lb Date of Birth:  1950-11-08       BSA:          2.159 m Patient Age:    71 years         BP:           147/53 mmHg Patient Gender: M                HR:           46 bpm. Exam Location:  Inpatient Procedure: Limited Echo, Color Doppler and Cardiac Doppler Indications:    dyspnea  History:        Patient has prior history of Echocardiogram examinations. CHF,                 CAD, chronic kidney disease; Risk Factors:Hypertension,                 Dyslipidemia and Sleep Apnea.  Sonographer:    Delcie Roch RDCS Referring Phys: [id number] Cecille Po MELVIN IMPRESSIONS  1. Left ventricular ejection fraction, by estimation, is 50%. The left ventricle has low normal function. The left ventricle demonstrates regional wall motion abnormalities (see scoring diagram/findings for description). Left ventricular diastolic parameters were grossly normal.  2. Right ventricular systolic function is normal. The right ventricular size is normal. There is normal pulmonary artery systolic pressure. The estimated right ventricular systolic pressure is 20.8 mmHg.  3. The mitral valve is grossly normal. Trivial mitral valve regurgitation.  4. The aortic valve is grossly normal. Aortic valve regurgitation is trivial. No aortic stenosis is present.  5. The inferior vena cava is normal in size with greater than 50% respiratory variability, suggesting right atrial pressure of 3  mmHg. FINDINGS  Left Ventricle: Left ventricular ejection fraction, by estimation, is 50%. The left ventricle has low normal function. The left ventricle demonstrates regional wall motion abnormalities. The left ventricular internal cavity size was normal in size. There is no left ventricular hypertrophy. Left ventricular  diastolic parameters were normal.  LV Wall Scoring: The mid anteroseptal segment, mid inferoseptal segment, and basal inferoseptal segment are hypokinetic. Right Ventricle: The right ventricular size is normal. No increase in right ventricular wall thickness. Right ventricular systolic function is normal. There is normal pulmonary artery systolic pressure. The tricuspid regurgitant velocity is 2.11 m/s, and  with an assumed right atrial pressure of 3 mmHg, the estimated right ventricular systolic pressure is 20.8 mmHg. Left Atrium: Left atrial size was normal in size. Right Atrium: Right atrial size was normal in size. Pericardium: There is no evidence of pericardial effusion. Mitral Valve: The mitral valve is grossly normal. Trivial mitral valve regurgitation. Tricuspid Valve: The tricuspid valve is grossly normal. Tricuspid valve regurgitation is trivial. Aortic Valve: The aortic valve is grossly normal. Aortic valve regurgitation is trivial. No aortic stenosis is present. Pulmonic Valve: The pulmonic valve was grossly normal. Pulmonic valve regurgitation is trivial. Aorta: The aortic root is normal in size and structure. Venous: The inferior vena cava is normal in size with greater than 50% respiratory variability, suggesting right atrial pressure of 3 mmHg. IAS/Shunts: The interatrial septum was not assessed.  LEFT VENTRICLE PLAX 2D LVIDd:         6.10 cm      Diastology LVIDs:         4.30 cm      LV e' medial:    7.18 cm/s LV PW:         1.00 cm      LV E/e' medial:  12.3 LV IVS:        0.90 cm      LV e' lateral:   12.50 cm/s LVOT diam:     2.20 cm      LV E/e' lateral: 7.0 LV SV:         97 LV SV Index:   45 LVOT Area:     3.80 cm  LV Volumes (MOD) LV vol d, MOD A4C: 118.0 ml LV vol s, MOD A4C: 66.5 ml LV SV MOD A4C:     118.0 ml IVC IVC diam: 1.20 cm LEFT ATRIUM         Index LA diam:    4.60 cm 2.13 cm/m  AORTIC VALVE LVOT Vmax:   104.00 cm/s LVOT Vmean:  66.600 cm/s LVOT VTI:    0.254 m  AORTA Ao Asc  diam: 3.60 cm MITRAL VALVE               TRICUSPID VALVE MV Area (PHT): 3.20 cm    TR Peak grad:   17.8 mmHg MV Decel Time: 237 msec    TR Vmax:        211.00 cm/s MV E velocity: 88.00 cm/s MV A velocity: 88.00 cm/s  SHUNTS MV E/A ratio:  1.00        Systemic VTI:  0.25 m                            Systemic Diam: 2.20 cm Weston Brass MD Electronically signed by Weston Brass MD Signature Date/Time: 08/18/2022/6:33:02 AM    Final    VAS [place] ABI WITH/WO TBI  Result Date: 08/17/2022  LOWER EXTREMITY DOPPLER STUDY Patient Name:  Brendan Butler  Date of Exam:   08/17/2022 Medical Rec #: [id number]         Accession #:    [id number] Date of Birth: 1950/11/30        Patient Gender: M Patient Age:   70 years Exam Location:  [facility] Procedure:      VAS [place] ABI WITH/WO TBI Referring Phys: Duwayne Heck RAY --------------------------------------------------------------------------------  Indications: Rest pain. High Risk Factors: Hypertension, hyperlipidemia, coronary artery disease.  Comparison Study: No previous study. Performing Technologist: McKayla Maag RVT, [place]  Examination Guidelines: A complete evaluation includes at minimum, Doppler waveform signals and systolic blood pressure reading at the level of bilateral brachial, anterior tibial, and posterior tibial arteries, when vessel segments are accessible. Bilateral testing is considered an integral part of a complete examination. Photoelectric Plethysmograph (PPG) waveforms and toe systolic pressure readings are included as required and additional duplex testing as needed. Limited examinations for reoccurring indications may be performed as noted.  ABI Findings: +---------+------------------+-----+-----------+-------------------------------+ Right    Rt Pressure (mmHg)IndexWaveform   Comment                         +---------+------------------+-----+-----------+-------------------------------+ Brachial                        triphasic  No  pressure obtained due to IV                                             placement                       +---------+------------------+-----+-----------+-------------------------------+ PTA      146               1.24 multiphasic                                +---------+------------------+-----+-----------+-------------------------------+ DP       143               1.21 multiphasic                                +---------+------------------+-----+-----------+-------------------------------+ Great Toe99                0.84 Normal                                     +---------+------------------+-----+-----------+-------------------------------+ +---------+------------------+-----+-----------+-------+ Left     Lt Pressure (mmHg)IndexWaveform   Comment +---------+------------------+-----+-----------+-------+ Brachial 118                    triphasic          +---------+------------------+-----+-----------+-------+ PTA      121               1.03 multiphasic        +---------+------------------+-----+-----------+-------+ DP       121               1.03 multiphasic        +---------+------------------+-----+-----------+-------+ Tawni Pummel  0.84 Normal             +---------+------------------+-----+-----------+-------+ +-------+-----------+-----------+------------+------------+ ABI/TBIToday's ABIToday's TBIPrevious ABIPrevious TBI +-------+-----------+-----------+------------+------------+ Right  1.24       0.84                                +-------+-----------+-----------+------------+------------+ Left   1.03       0.84                                +-------+-----------+-----------+------------+------------+  Summary: Right: Resting right ankle-brachial index is within normal range. The right toe-brachial index is normal. Left: Resting left ankle-brachial index is within normal range. The left toe-brachial index is normal. *See  table(s) above for measurements and observations.  Electronically signed by Waverly Ferrari MD on 08/17/2022 at 5:06:18 PM.    Final    VAS [place] LOWER EXTREMITY VENOUS (DVT) (7a-7p)  Result Date: 08/17/2022  Lower Venous DVT Study Patient Name:  Brendan Butler  Date of Exam:   08/17/2022 Medical Rec #: [id number]         Accession #:    [id number] Date of Birth: Jul 02, 1950        Patient Gender: M Patient Age:   16 years Exam Location:  [facility] Procedure:      VAS [place] LOWER EXTREMITY VENOUS (DVT) Referring Phys: Duwayne Heck RAY --------------------------------------------------------------------------------  Indications: Swelling.  Comparison Study: Previous study 07/30/22 negative. Performing Technologist: McKayla Maag RVT, [place]  Examination Guidelines: A complete evaluation includes B-mode imaging, spectral Doppler, color Doppler, and power Doppler as needed of all accessible portions of each vessel. Bilateral testing is considered an integral part of a complete examination. Limited examinations for reoccurring indications may be performed as noted. The reflux portion of the exam is performed with the patient in reverse Trendelenburg.  +---------+---------------+---------+-----------+----------+--------------+ RIGHT    CompressibilityPhasicitySpontaneityPropertiesThrombus Aging +---------+---------------+---------+-----------+----------+--------------+ CFV      Full           Yes      Yes                                 +---------+---------------+---------+-----------+----------+--------------+ SFJ      Full                                                        +---------+---------------+---------+-----------+----------+--------------+ FV Prox  Full                                                        +---------+---------------+---------+-----------+----------+--------------+ FV Mid   Full                                                         +---------+---------------+---------+-----------+----------+--------------+ FV DistalFull                                                        +---------+---------------+---------+-----------+----------+--------------+  PFV      Full                                                        +---------+---------------+---------+-----------+----------+--------------+ POP      Full           Yes      Yes                                 +---------+---------------+---------+-----------+----------+--------------+ PTV      Full                                                        +---------+---------------+---------+-----------+----------+--------------+ PERO     Full                                                        +---------+---------------+---------+-----------+----------+--------------+   +----+---------------+---------+-----------+----------+--------------+ LEFTCompressibilityPhasicitySpontaneityPropertiesThrombus Aging +----+---------------+---------+-----------+----------+--------------+ CFV Full           Yes      Yes                                 +----+---------------+---------+-----------+----------+--------------+ SFJ Full                                                        +----+---------------+---------+-----------+----------+--------------+     Summary: RIGHT: - There is no evidence of deep vein thrombosis in the lower extremity.  - No cystic structure found in the popliteal fossa.  LEFT: - No evidence of common femoral vein obstruction.  *See table(s) above for measurements and observations. Electronically signed by Waverly Ferrari MD on 08/17/2022 at 5:06:09 PM.    Final       Assessment & Plan    Congestive heart failure acute/chronic-HFrEF class III  Ischemic/nonischemic cardiomyopathy  GI bleeding-recurrent  Anemia-status posttransfusion  Renal insufficiency class IIIb Estimated Creatinine Clearance: 45.2 mL/min (A) (by  C-G formula based on SCr of 1.81 mg/dL (H)).  Atrial fibrillation/flutter with prior cardioversion  Sinus bradycardia  AV conduction pauses  Volume status stable  changd diuretic to po For colonoscopy in am   Continue amio but will have to follow bradycardia      For questions or updates, please contact [facility] Please consult [email or web address] for contact info under Cardiology/STEMI.      Signed, Sherryl Manges, MD  08/19/2022, 10:44 AM

## 2022-08-19 NOTE — Progress Notes (Signed)
PROGRESS NOTE    Brendan Butler  WUJ:[id number] DOB: 04/25/1950 DOA: 08/17/2022 PCP: [name], MD  72/M with history of CAD, multiple PCI and stenting, atrial flutter, depression, hypertension, OSA, CKD 3, bronchiectasis, left nephrectomy presented to the ED with shortness of breath and edema, progressive weight gain and fatigue. -Recently admitted with worsening anemia and positive Hemoccult, transfused PRBC, declined colonoscopy then. -In the ED heart rate 40-50, creatinine 1.9, hemoglobin 7.3, chest x-ray noted mild cardiomegaly   Subjective: -Feels better overall, breathing is improving  Assessment and Plan:  Iron deficiency anemia Heme positive stools -Recent diagnosis, had heme positive stools on admission few weeks ago, declined GI workup then, now amenable -Holding Xarelto [facility] gastroenterology consulted -Transfused 1 unit PRBC, add IV iron -Hemoglobin stable, improving  Acute on chronic diastolic CHF -Last echo 6/24 with EF 50%, regional wall motion abnormalities, normal RV -Worsened in the setting of anemia, troponin is normal -Now improving, continue IV Lasix, continue Jardiance -Hold Entresto with bump in creatinine, resume Aldactone tomorrow, if creatinine improving  AKI on CKD 3a -Likely hemodynamically mediated, compounded by worsening anemia -Holding Entresto -Clinically volume overloaded, continue IV Lasix, hemoglobin improving  Lung Nodule -Concerning for adeno CA, noted on prior imaging, followed by Dr. Delton Coombes, he missed his appointment for PET scan -This will need to be rescheduled, emphasized with patient regarding importance of this workup  Atrial flutter -With bradycardia, currently on amiodarone, Xarelto on hold   CAD HLD > Status post multiple stenting > Does notably have an aspirin allergy - Continue  statin,  - Hold Xarelto as above   Depression - Continue home bupropion, paroxetine, Seroquel   Hypertension -Holding Entresto and  Aldactone today   OSA - Not on CPAP per chart review   DVT prophylaxis: SCDs Code Status: Full code Family Communication: None present Disposition Plan: Home likely 2 to 3 days  Consultants:    Procedures:   Antimicrobials:    Objective: Vitals:   08/19/22 0005 08/19/22 0400 08/19/22 0600 08/19/22 0732  BP: (!) 107/50 (!) 130/49  (!) 155/53  Pulse: (!) 55 (!) 57  (!) 53  Resp: 19 17  18   Temp:    97.9 F (36.6 C)  TempSrc:      SpO2: 95% 98%  91%  Weight:   97.7 kg   Height:        Intake/Output Summary (Last 24 hours) at 08/19/2022 1101 Last data filed at 08/19/2022 9562 Gross per 24 hour  Intake 260 ml  Output 2600 ml  Net -2340 ml   Filed Weights   08/17/22 0912 08/17/22 1600 08/19/22 0600  Weight: 100.2 kg 98.2 kg 97.7 kg    Examination:  Obese male sitting up in bed, AAOx3, no distress HEENT: Positive JVD CVS: S1-S2, regular rhythm Lungs: Few basilar rales Abdomen: Soft, nontender, bowel sounds present Extremities: 1+ edema Skin: No rashes Psychiatry:  Mood & affect appropriate.     Data Reviewed:   CBC: Recent Labs  Lab 08/17/22 1011 08/17/22 1619 08/18/22 0055 08/19/22 0036  WBC 9.5  --  8.8 9.8  HGB 7.3* 7.8* 9.0* 9.5*  HCT 24.3* 26.4* 29.7* 30.7*  MCV 85.6  --  85.3 81.4  PLT 223  --  237 267   Basic Metabolic Panel: Recent Labs  Lab 08/17/22 1011 08/17/22 1017 08/18/22 0055 08/19/22 0036  NA 138  --  140 138  K 3.8  --  4.4 3.8  CL 104  --  104 101  CO2 25  --  26 26  GLUCOSE 102*  --  92 92  BUN 23  --  22 22  CREATININE 1.99*  --  2.02* 1.81*  CALCIUM 8.3*  --  8.5* 8.7*  MG  --  2.4  --   --    GFR: Estimated Creatinine Clearance: 45.2 mL/min (A) (by C-G formula based on SCr of 1.81 mg/dL (H)). Liver Function Tests: Recent Labs  Lab 08/17/22 1619 08/18/22 0055  AST 16 17  ALT 15 14  ALKPHOS 73 67  BILITOT 0.7 0.7  PROT 6.6 6.4*  ALBUMIN 3.6 3.4*   No results for input(s): "LIPASE", "AMYLASE" in the last  168 hours. No results for input(s): "AMMONIA" in the last 168 hours. Coagulation Profile: No results for input(s): "INR", "PROTIME" in the last 168 hours. Cardiac Enzymes: No results for input(s): "CKTOTAL", "CKMB", "CKMBINDEX", "TROPONINI" in the last 168 hours. BNP (last 3 results) No results for input(s): "PROBNP" in the last 8760 hours. HbA1C: No results for input(s): "HGBA1C" in the last 72 hours. CBG: No results for input(s): "GLUCAP" in the last 168 hours. Lipid Profile: No results for input(s): "CHOL", "HDL", "LDLCALC", "TRIG", "CHOLHDL", "LDLDIRECT" in the last 72 hours. Thyroid Function Tests: Recent Labs    08/17/22 1445  TSH 8.374*   Anemia Panel: Recent Labs    08/17/22 1017  FERRITIN 17*  TIBC 420  IRON 12*   Urine analysis:    Component Value Date/Time   COLORURINE AMBER (A) 05/20/2022 2038   APPEARANCEUR CLEAR 05/20/2022 2038   LABSPEC 1.034 (H) 05/20/2022 2038   PHURINE 5.0 05/20/2022 2038   GLUCOSEU NEGATIVE 05/20/2022 2038   HGBUR NEGATIVE 05/20/2022 2038   BILIRUBINUR NEGATIVE 05/20/2022 2038   KETONESUR NEGATIVE 05/20/2022 2038   PROTEINUR NEGATIVE 05/20/2022 2038   NITRITE NEGATIVE 05/20/2022 2038   LEUKOCYTESUR NEGATIVE 05/20/2022 2038   Sepsis Labs: @LABRCNTIP (procalcitonin:4,lacticidven:4)  )No results found for this or any previous visit (from the past 240 hour(s)).   Radiology Studies: ECHOCARDIOGRAM LIMITED  Result Date: 08/18/2022    ECHOCARDIOGRAM REPORT   Patient Name:   Brendan Butler Date of Exam: 08/17/2022 Medical Rec #:  [id number]        Height:       70.0 in Accession #:    [id number]       Weight:       216.5 lb Date of Birth:  Feb 05, 1951       BSA:          2.159 m Patient Age:    72 years         BP:           147/53 mmHg Patient Gender: M                HR:           46 bpm. Exam Location:  Inpatient Procedure: Limited Echo, Color Doppler and Cardiac Doppler Indications:    dyspnea  History:        Patient has prior history  of Echocardiogram examinations. CHF,                 CAD, chronic kidney disease; Risk Factors:Hypertension,                 Dyslipidemia and Sleep Apnea.  Sonographer:    Delcie Roch RDCS Referring Phys: [id number] Cecille Po MELVIN IMPRESSIONS  1. Left ventricular ejection fraction, by estimation, is 50%. The left ventricle has low  normal function. The left ventricle demonstrates regional wall motion abnormalities (see scoring diagram/findings for description). Left ventricular diastolic parameters were grossly normal.  2. Right ventricular systolic function is normal. The right ventricular size is normal. There is normal pulmonary artery systolic pressure. The estimated right ventricular systolic pressure is 20.8 mmHg.  3. The mitral valve is grossly normal. Trivial mitral valve regurgitation.  4. The aortic valve is grossly normal. Aortic valve regurgitation is trivial. No aortic stenosis is present.  5. The inferior vena cava is normal in size with greater than 50% respiratory variability, suggesting right atrial pressure of 3 mmHg. FINDINGS  Left Ventricle: Left ventricular ejection fraction, by estimation, is 50%. The left ventricle has low normal function. The left ventricle demonstrates regional wall motion abnormalities. The left ventricular internal cavity size was normal in size. There is no left ventricular hypertrophy. Left ventricular diastolic parameters were normal.  LV Wall Scoring: The mid anteroseptal segment, mid inferoseptal segment, and basal inferoseptal segment are hypokinetic. Right Ventricle: The right ventricular size is normal. No increase in right ventricular wall thickness. Right ventricular systolic function is normal. There is normal pulmonary artery systolic pressure. The tricuspid regurgitant velocity is 2.11 m/s, and  with an assumed right atrial pressure of 3 mmHg, the estimated right ventricular systolic pressure is 20.8 mmHg. Left Atrium: Left atrial size was normal in  size. Right Atrium: Right atrial size was normal in size. Pericardium: There is no evidence of pericardial effusion. Mitral Valve: The mitral valve is grossly normal. Trivial mitral valve regurgitation. Tricuspid Valve: The tricuspid valve is grossly normal. Tricuspid valve regurgitation is trivial. Aortic Valve: The aortic valve is grossly normal. Aortic valve regurgitation is trivial. No aortic stenosis is present. Pulmonic Valve: The pulmonic valve was grossly normal. Pulmonic valve regurgitation is trivial. Aorta: The aortic root is normal in size and structure. Venous: The inferior vena cava is normal in size with greater than 50% respiratory variability, suggesting right atrial pressure of 3 mmHg. IAS/Shunts: The interatrial septum was not assessed.  LEFT VENTRICLE PLAX 2D LVIDd:         6.10 cm      Diastology LVIDs:         4.30 cm      LV e' medial:    7.18 cm/s LV PW:         1.00 cm      LV E/e' medial:  12.3 LV IVS:        0.90 cm      LV e' lateral:   12.50 cm/s LVOT diam:     2.20 cm      LV E/e' lateral: 7.0 LV SV:         97 LV SV Index:   45 LVOT Area:     3.80 cm  LV Volumes (MOD) LV vol d, MOD A4C: 118.0 ml LV vol s, MOD A4C: 66.5 ml LV SV MOD A4C:     118.0 ml IVC IVC diam: 1.20 cm LEFT ATRIUM         Index LA diam:    4.60 cm 2.13 cm/m  AORTIC VALVE LVOT Vmax:   104.00 cm/s LVOT Vmean:  66.600 cm/s LVOT VTI:    0.254 m  AORTA Ao Asc diam: 3.60 cm MITRAL VALVE               TRICUSPID VALVE MV Area (PHT): 3.20 cm    TR Peak grad:   17.8 mmHg MV Decel Time: 237 msec  TR Vmax:        211.00 cm/s MV E velocity: 88.00 cm/s MV A velocity: 88.00 cm/s  SHUNTS MV E/A ratio:  1.00        Systemic VTI:  0.25 m                            Systemic Diam: 2.20 cm Weston Brass MD Electronically signed by Weston Brass MD Signature Date/Time: 08/18/2022/6:33:02 AM    Final    VAS [place] ABI WITH/WO TBI  Result Date: 08/17/2022  LOWER EXTREMITY DOPPLER STUDY Patient Name:  KELDON LASSEN  Date of Exam:    08/17/2022 Medical Rec #: [id number]         Accession #:    [id number] Date of Birth: 05-Aug-1950        Patient Gender: M Patient Age:   33 years Exam Location:  [facility] Procedure:      VAS [place] ABI WITH/WO TBI Referring Phys: Duwayne Heck RAY --------------------------------------------------------------------------------  Indications: Rest pain. High Risk Factors: Hypertension, hyperlipidemia, coronary artery disease.  Comparison Study: No previous study. Performing Technologist: McKayla Maag RVT, [place]  Examination Guidelines: A complete evaluation includes at minimum, Doppler waveform signals and systolic blood pressure reading at the level of bilateral brachial, anterior tibial, and posterior tibial arteries, when vessel segments are accessible. Bilateral testing is considered an integral part of a complete examination. Photoelectric Plethysmograph (PPG) waveforms and toe systolic pressure readings are included as required and additional duplex testing as needed. Limited examinations for reoccurring indications may be performed as noted.  ABI Findings: +---------+------------------+-----+-----------+-------------------------------+ Right    Rt Pressure (mmHg)IndexWaveform   Comment                         +---------+------------------+-----+-----------+-------------------------------+ Brachial                        triphasic  No pressure obtained due to IV                                             placement                       +---------+------------------+-----+-----------+-------------------------------+ PTA      146               1.24 multiphasic                                +---------+------------------+-----+-----------+-------------------------------+ DP       143               1.21 multiphasic                                +---------+------------------+-----+-----------+-------------------------------+ Great Toe99                0.84 Normal                                      +---------+------------------+-----+-----------+-------------------------------+ +---------+------------------+-----+-----------+-------+ Left     Lt Pressure (mmHg)IndexWaveform   Comment +---------+------------------+-----+-----------+-------+ Brachial 118  triphasic          +---------+------------------+-----+-----------+-------+ PTA      121               1.03 multiphasic        +---------+------------------+-----+-----------+-------+ DP       121               1.03 multiphasic        +---------+------------------+-----+-----------+-------+ Great Toe99                0.84 Normal             +---------+------------------+-----+-----------+-------+ +-------+-----------+-----------+------------+------------+ ABI/TBIToday's ABIToday's TBIPrevious ABIPrevious TBI +-------+-----------+-----------+------------+------------+ Right  1.24       0.84                                +-------+-----------+-----------+------------+------------+ Left   1.03       0.84                                +-------+-----------+-----------+------------+------------+  Summary: Right: Resting right ankle-brachial index is within normal range. The right toe-brachial index is normal. Left: Resting left ankle-brachial index is within normal range. The left toe-brachial index is normal. *See table(s) above for measurements and observations.  Electronically signed by Waverly Ferrari MD on 08/17/2022 at 5:06:18 PM.    Final    VAS [place] LOWER EXTREMITY VENOUS (DVT) (7a-7p)  Result Date: 08/17/2022  Lower Venous DVT Study Patient Name:  LORING LISKEY  Date of Exam:   08/17/2022 Medical Rec #: [id number]         Accession #:    [id number] Date of Birth: 12/12/1950        Patient Gender: M Patient Age:   30 years Exam Location:  [facility] Procedure:      VAS [place] LOWER EXTREMITY VENOUS (DVT) Referring Phys: Duwayne Heck RAY  --------------------------------------------------------------------------------  Indications: Swelling.  Comparison Study: Previous study 07/30/22 negative. Performing Technologist: McKayla Maag RVT, [place]  Examination Guidelines: A complete evaluation includes B-mode imaging, spectral Doppler, color Doppler, and power Doppler as needed of all accessible portions of each vessel. Bilateral testing is considered an integral part of a complete examination. Limited examinations for reoccurring indications may be performed as noted. The reflux portion of the exam is performed with the patient in reverse Trendelenburg.  +---------+---------------+---------+-----------+----------+--------------+ RIGHT    CompressibilityPhasicitySpontaneityPropertiesThrombus Aging +---------+---------------+---------+-----------+----------+--------------+ CFV      Full           Yes      Yes                                 +---------+---------------+---------+-----------+----------+--------------+ SFJ      Full                                                        +---------+---------------+---------+-----------+----------+--------------+ FV Prox  Full                                                        +---------+---------------+---------+-----------+----------+--------------+  FV Mid   Full                                                        +---------+---------------+---------+-----------+----------+--------------+ FV DistalFull                                                        +---------+---------------+---------+-----------+----------+--------------+ PFV      Full                                                        +---------+---------------+---------+-----------+----------+--------------+ POP      Full           Yes      Yes                                 +---------+---------------+---------+-----------+----------+--------------+ PTV      Full                                                         +---------+---------------+---------+-----------+----------+--------------+ PERO     Full                                                        +---------+---------------+---------+-----------+----------+--------------+   +----+---------------+---------+-----------+----------+--------------+ LEFTCompressibilityPhasicitySpontaneityPropertiesThrombus Aging +----+---------------+---------+-----------+----------+--------------+ CFV Full           Yes      Yes                                 +----+---------------+---------+-----------+----------+--------------+ SFJ Full                                                        +----+---------------+---------+-----------+----------+--------------+     Summary: RIGHT: - There is no evidence of deep vein thrombosis in the lower extremity.  - No cystic structure found in the popliteal fossa.  LEFT: - No evidence of common femoral vein obstruction.  *See table(s) above for measurements and observations. Electronically signed by Waverly Ferrari MD on 08/17/2022 at 5:06:09 PM.    Final      Scheduled Meds:  sodium chloride   Intravenous Once   amiodarone  100 mg Oral Daily   buPROPion  150 mg Oral Daily   empagliflozin  10 mg Oral Daily   furosemide  40 mg Intravenous Q12H   pantoprazole  40 mg Oral Daily   PARoxetine  30 mg Oral Daily   polyethylene glycol-electrolytes  4,000 mL Oral Once   pregabalin  150 mg Oral TID   QUEtiapine  25 mg Oral QHS   rosuvastatin  20 mg Oral Daily   sodium chloride flush  3 mL Intravenous Q12H   Continuous Infusions:  sodium chloride     ferric gluconate (FERRLECIT) IVPB       LOS: 1 day    Time spent:    Zannie Cove, MD [facility]   08/19/2022, 11:01 AM

## 2022-08-19 NOTE — Evaluation (Signed)
Occupational Therapy Evaluation Patient Details Name: Brendan Butler MRN: [id number] DOB: [birth year] Today's Date: 08/19/2022   History of Present Illness Brendan Butler is a 72 y.o. male presenting with shortness of breath and edema. PMH:CAD status post multiple stenting, symptomatic bradycardia, atrial flutter, depression, hypertension, hyperlipidemia, OSA, CKD 3A, bronchiectasis, CHF, status post left nephrectomy, ASA allergy   Clinical Impression   Patient admitted for the diagnosis above.  PTA he lives alone, but has assist from family if needed.  He is up and walking in the room, and is needing only generalized supervision for ADL and in room mobility.  No significant OT needs exist in the acute setting, and no post acute OT is recommended.  Mobility referral placed for continued OOB and mobility.        Recommendations for follow up therapy are one component of a multi-disciplinary discharge planning process, led by the attending physician.  Recommendations may be updated based on patient status, additional functional criteria and insurance authorization.   Assistance Recommended at Discharge Set up Supervision/Assistance  Patient can return home with the following Assist for transportation    Functional Status Assessment  Patient has not had a recent decline in their functional status  Equipment Recommendations  None recommended by OT    Recommendations for Other Services       Precautions / Restrictions Precautions Precautions: Fall Precaution Comments: watch O2 Restrictions Weight Bearing Restrictions: No      Mobility Bed Mobility Overal bed mobility: Independent               Patient Response: Cooperative  Transfers Overall transfer level: Needs assistance   Transfers: Sit to/from Stand, Bed to chair/wheelchair/BSC Sit to Stand: Supervision     Step pivot transfers: Supervision            Balance Overall balance assessment: Mild deficits  observed, not formally tested                                         ADL either performed or assessed with clinical judgement   ADL                                         General ADL Comments: Generalized supervision     Vision Patient Visual Report: No change from baseline       Perception     Praxis      Pertinent Vitals/Pain Pain Assessment Pain Assessment: No/denies pain     Hand Dominance Right   Extremity/Trunk Assessment Upper Extremity Assessment Upper Extremity Assessment: Overall WFL for tasks assessed   Lower Extremity Assessment Lower Extremity Assessment: Defer to PT evaluation   Cervical / Trunk Assessment Cervical / Trunk Assessment: Normal   Communication Communication Communication: No difficulties   Cognition Arousal/Alertness: Awake/alert Behavior During Therapy: WFL for tasks assessed/performed Overall Cognitive Status: Within Functional Limits for tasks assessed                                       General Comments       Exercises     Shoulder Instructions      Home Living Family/patient expects to be discharged to:: Private residence Living  Arrangements: Alone Available Help at Discharge: Family;Available 24 hours/day;Friend(s) Type of Home: House Home Access: Stairs to enter [employer] of Steps: 3 Entrance Stairs-Rails: Can reach both;Left;Right Home Layout: One level     Bathroom Shower/Tub: Chief Strategy Officer: Handicapped height Bathroom Accessibility: Yes   Home Equipment: Agricultural consultant (2 wheels);Rollator (4 wheels);Standard Walker;Cane - single point;Wheelchair - manual;Other (comment);Grab bars - tub/shower;Hand held shower head          Prior Functioning/Environment Prior Level of Function : Independent/Modified Independent;Driving;History of Falls (last six months)             Mobility Comments: amb without AD ADLs Comments:  indep, drives, grocery shops, works in garden        OT Problem List: Impaired balance (sitting and/or standing);Decreased activity tolerance      OT Treatment/Interventions:      OT Goals(Current goals can be found in the care plan section) Acute Rehab OT Goals Patient Stated Goal: Return home OT Goal Formulation: With patient Time For Goal Achievement: 08/24/22 Potential to Achieve Goals: Good  OT Frequency:      Co-evaluation              AM-PAC OT "6 Clicks" Daily Activity     Outcome Measure Help from another person eating meals?: None Help from another person taking care of personal grooming?: None Help from another person toileting, which includes using toliet, bedpan, or urinal?: None Help from another person bathing (including washing, rinsing, drying)?: A Little Help from another person to put on and taking off regular upper body clothing?: None Help from another person to put on and taking off regular lower body clothing?: A Little 6 Click Score: 22   End of Session Equipment Utilized During Treatment: Oxygen Nurse Communication: Mobility status  Activity Tolerance: Patient tolerated treatment well Patient left: in bed;with call bell/phone within reach  OT Visit Diagnosis: Unsteadiness on feet (R26.81)                Time: 1610-9604 OT Time Calculation (min): 21 min Charges:  OT General Charges $OT Visit: 1 Visit OT Evaluation $OT Eval Moderate Complexity: 1 Mod  08/19/2022  RP, OTR/L  Acute Rehabilitation Services  Office:  [phone]   Suzanna Obey 08/19/2022, 7:33 AM

## 2022-08-20 ENCOUNTER — Inpatient Hospital Stay (HOSPITAL_COMMUNITY): Payer: Medicare HMO | Admitting: Anesthesiology

## 2022-08-20 ENCOUNTER — Encounter (HOSPITAL_COMMUNITY)
Admission: EM | Disposition: A | Discharge status: Home / Self Care | Payer: Self-pay | Source: Home / Self Care | Attending: Internal Medicine

## 2022-08-20 ENCOUNTER — Encounter (HOSPITAL_COMMUNITY): Payer: Self-pay | Admitting: Internal Medicine

## 2022-08-20 DIAGNOSIS — D509 Iron deficiency anemia, unspecified: Secondary | ICD-10-CM | POA: Diagnosis not present

## 2022-08-20 DIAGNOSIS — K5730 Diverticulosis of large intestine without perforation or abscess without bleeding: Secondary | ICD-10-CM

## 2022-08-20 DIAGNOSIS — K5710 Diverticulosis of small intestine without perforation or abscess without bleeding: Secondary | ICD-10-CM

## 2022-08-20 DIAGNOSIS — I480 Paroxysmal atrial fibrillation: Secondary | ICD-10-CM | POA: Diagnosis not present

## 2022-08-20 DIAGNOSIS — K635 Polyp of colon: Secondary | ICD-10-CM | POA: Diagnosis not present

## 2022-08-20 DIAGNOSIS — K449 Diaphragmatic hernia without obstruction or gangrene: Secondary | ICD-10-CM | POA: Diagnosis not present

## 2022-08-20 DIAGNOSIS — C183 Malignant neoplasm of hepatic flexure: Secondary | ICD-10-CM | POA: Diagnosis not present

## 2022-08-20 DIAGNOSIS — D500 Iron deficiency anemia secondary to blood loss (chronic): Secondary | ICD-10-CM | POA: Diagnosis not present

## 2022-08-20 DIAGNOSIS — R195 Other fecal abnormalities: Secondary | ICD-10-CM | POA: Diagnosis not present

## 2022-08-20 DIAGNOSIS — K2970 Gastritis, unspecified, without bleeding: Secondary | ICD-10-CM | POA: Diagnosis not present

## 2022-08-20 DIAGNOSIS — D649 Anemia, unspecified: Secondary | ICD-10-CM | POA: Diagnosis not present

## 2022-08-20 HISTORY — PX: BIOPSY: SHX5522

## 2022-08-20 HISTORY — PX: COLONOSCOPY WITH PROPOFOL: SHX5780

## 2022-08-20 HISTORY — PX: ESOPHAGOGASTRODUODENOSCOPY (EGD) WITH PROPOFOL: SHX5813

## 2022-08-20 HISTORY — PX: SUBMUCOSAL TATTOO INJECTION: SHX6856

## 2022-08-20 LAB — BASIC METABOLIC PANEL
Anion gap: 12 (ref 5–15)
BUN: 24 mg/dL — ABNORMAL HIGH (ref 8–23)
CO2: 26 mmol/L (ref 22–32)
Calcium: 8.4 mg/dL — ABNORMAL LOW (ref 8.9–10.3)
Chloride: 99 mmol/L (ref 98–111)
Creatinine, Ser: 1.93 mg/dL — ABNORMAL HIGH (ref 0.61–1.24)
GFR, Estimated: 37 mL/min — ABNORMAL LOW (ref >60)
Glucose, Bld: 90 mg/dL (ref 70–99)
Potassium: 3.5 mmol/L (ref 3.5–5.1)
Sodium: 137 mmol/L (ref 135–145)

## 2022-08-20 LAB — CBC
HCT: 30.6 % — ABNORMAL LOW (ref 39.0–52.0)
Hemoglobin: 9.2 g/dL — ABNORMAL LOW (ref 13.0–17.0)
MCH: 24.7 pg — ABNORMAL LOW (ref 26.0–34.0)
MCHC: 30.1 g/dL (ref 30.0–36.0)
MCV: 82 fL (ref 80.0–100.0)
Platelets: 264 10*3/uL (ref 150–400)
RBC: 3.73 MIL/uL — ABNORMAL LOW (ref 4.22–5.81)
RDW: 17.2 % — ABNORMAL HIGH (ref 11.5–15.5)
WBC: 10.6 10*3/uL — ABNORMAL HIGH (ref 4.0–10.5)
nRBC: 0 % (ref 0.0–0.2)

## 2022-08-20 SURGERY — ESOPHAGOGASTRODUODENOSCOPY (EGD) WITH PROPOFOL
Anesthesia: Monitor Anesthesia Care

## 2022-08-20 MED ORDER — PROPOFOL 500 MG/50ML IV EMUL
INTRAVENOUS | Status: DC | PRN
  Administered 2022-08-20: 125 ug/kg/min via INTRAVENOUS

## 2022-08-20 MED ORDER — FUROSEMIDE 40 MG PO TABS
40.0000 mg | ORAL_TABLET | Freq: Two times a day (BID) | ORAL | Status: DC
  Administered 2022-08-20 – 2022-08-28 (×14): 40 mg via ORAL
  Filled 2022-08-20 (×16): qty 1

## 2022-08-20 MED ORDER — POTASSIUM CHLORIDE CRYS ER 20 MEQ PO TBCR
40.0000 meq | EXTENDED_RELEASE_TABLET | Freq: Once | ORAL | Status: AC
  Administered 2022-08-20: 40 meq via ORAL
  Filled 2022-08-20: qty 2

## 2022-08-20 MED ORDER — SPOT INK MARKER SYRINGE KIT
PACK | SUBMUCOSAL | Status: DC | PRN
  Administered 2022-08-20: 1.5 mL via SUBMUCOSAL

## 2022-08-20 MED ORDER — LACTATED RINGERS IV SOLN: INTRAVENOUS | Status: DC | PRN

## 2022-08-20 MED ORDER — PROPOFOL 10 MG/ML IV BOLUS
INTRAVENOUS | Status: DC | PRN
  Administered 2022-08-20: 60 mg via INTRAVENOUS
  Administered 2022-08-20: 30 mg via INTRAVENOUS

## 2022-08-20 SURGICAL SUPPLY — 25 items

## 2022-08-20 NOTE — Anesthesia Postprocedure Evaluation (Signed)
Anesthesia Post Note  Patient: RIVERS HAMRICK  Procedure(s) Performed: ESOPHAGOGASTRODUODENOSCOPY (EGD) WITH PROPOFOL COLONOSCOPY WITH PROPOFOL BIOPSY SUBMUCOSAL TATTOO INJECTION     Patient location during evaluation: PACU Anesthesia Type: MAC Level of consciousness: awake and alert Pain management: pain level controlled Vital Signs Assessment: post-procedure vital signs reviewed and stable Respiratory status: spontaneous breathing, nonlabored ventilation and respiratory function stable Cardiovascular status: blood pressure returned to baseline and stable Postop Assessment: no apparent nausea or vomiting Anesthetic complications: no   No notable events documented.  Last Vitals:  Vitals:   08/20/22 0915 08/20/22 0945  BP: (!) 140/57 (!) 186/63  Pulse: (!) 54 (!) 54  Resp: 12 18  Temp: 36.6 C 36.5 C  SpO2: 97% 95%    Last Pain:  Vitals:   08/20/22 0945  TempSrc: Oral  PainSc:                  Lannie Fields

## 2022-08-20 NOTE — TOC Initial Note (Signed)
Transition of Care [facility]) - Initial/Assessment Note    Patient Details  Name: Brendan Butler MRN: [id number] Date of Birth: 06/15/1950  Transition of Care [facility]) CM/SW Contact:    Leone Haven, RN Phone Number: 08/20/2022, 3:44 PM  Clinical Narrative:                 From home alone.  He has PCP , Dr. Isaac Laud, he has insurance on file with medication coverage, he has a HHRN, he has a cane and a walker that he does not use.  His daughter, [name] will transport him home  at [employer], she is his support system, he gets his meds from [facility] by mail order and [facility] [place]. He states he would like to continue with his HHRN.     Expected Discharge Plan: Home w Home Health Services Barriers to Discharge: Continued Medical Work up   Patient Goals and CMS Choice Patient states their goals for this hospitalization and ongoing recovery are:: to return home with wife   Choice offered to / list presented to : NA      Expected Discharge Plan and Services In-house Referral: NA Discharge Planning Services: CM Consult Post Acute Care Choice: Resumption of Svcs/PTA Provider Living arrangements for the past 2 months: Single Family Home                 DME Arranged: N/A DME Agency: NA       HH Arranged: NA          Prior Living Arrangements/Services Living arrangements for the past 2 months: Single Family Home Lives with:: Spouse Patient language and need for interpreter reviewed:: Yes Do you feel safe going back to the place where you live?: Yes      Need for Family Participation in Patient Care: Yes (Comment) Care giver support system in place?: Yes (comment) Current home services: DME, Homehealth aide (walker,cane, [facility]) Criminal Activity/Legal Involvement Pertinent to Current Situation/Hospitalization: No - Comment as needed  Activities of Daily Living      Permission Sought/Granted                  Emotional  Assessment Appearance:: Appears stated age Attitude/Demeanor/Rapport: Engaged Affect (typically observed): Appropriate Orientation: : Oriented to Self, Oriented to Place, Oriented to  Time, Oriented to Situation Alcohol / Substance Use: Not Applicable Psych Involvement: No (comment)  Admission diagnosis:  Symptomatic anemia [D64.9] Dyspnea, unspecified type [R06.00] Patient Active Problem List   Diagnosis Date Noted   Persistent atrial fibrillation (HCC) 08/17/2022   Symptomatic anemia 08/17/2022   Hypercoagulable state due to typical atrial flutter (HCC) 06/14/2022   Falls frequently 05/23/2022   MDD (major depressive disorder), recurrent episode, mild (HCC) 05/10/2022   Delirium due to another medical condition 05/10/2022   Hypokalemia 05/06/2022   Pulmonary nodules 12/27/2021   Symptomatic bradycardia 12/25/2021   Preoperative cardiovascular examination    Intra-abdominal abscess (HCC) 01/17/2021   Lung nodule 01/17/2021   Aneurysm of ascending aorta (HCC) 01/17/2021   Stage 3a chronic kidney disease (CKD) (HCC) 01/17/2021   Weight loss 01/17/2021   Bronchiectasis (HCC) 01/17/2021   Chronic cholecystitis 01/16/2021   Traumatic hematoma of left popliteal region 01/28/2016   DJD (degenerative joint disease) of pelvis 01/28/2016   Atypical chest pain    Chest pain 01/27/2016   Unstable angina (HCC) 04/21/2013   Leukocytosis 04/21/2013   Subsequent ST elevation (STEMI) myocardial infarction of anterior wall within 4 weeks  of initial infarction (HCC) 01/08/2013    Class: Hospitalized for   Illiteracy and low-level literacy 01/08/2013   Aspirin allergy- hives 01/06/2013   STEMI 12/29/12 Rx'd with LAD DES with early stent thrombosis, STEMI-PCI 01/05/14 01/05/2013   Coronary artery disease involving native coronary artery of native heart without angina pectoris 06/29/2007   Dyslipidemia 06/25/2007   Essential hypertension 06/25/2007   ALLERGIC RHINITIS 06/25/2007   OSA on CPAP  06/25/2007   PCP:  [name], MD Pharmacy:   [id number] [place], [place] - [address] AT [facility] [address] [place] [address] Phone: [phone] Fax: [phone]  [facility] - [place], [place] - [address] [place] [address] Phone: [phone] Fax: [phone]  [facility] [address] [place] [address] Phone: [phone] Fax: [phone]     Social Determinants of Health (SDOH) Social History: SDOH Screenings   Food Insecurity: No Food Insecurity (08/20/2022)  Housing: Low Risk  (08/20/2022)  Transportation Needs: No Transportation Needs (08/20/2022)  Utilities: Not At Risk (05/21/2022)  Alcohol Screen: Low Risk  (08/20/2022)  Financial Resource Strain: Low Risk  (08/20/2022)  Tobacco Use: High Risk (08/20/2022)   SDOH Interventions: Food Insecurity Interventions: Intervention Not Indicated Housing Interventions: Intervention Not Indicated Transportation Interventions: Intervention Not Indicated Alcohol Usage Interventions: Intervention Not Indicated (Score <7) Financial Strain Interventions: Intervention Not Indicated   Readmission Risk Interventions     No data to display

## 2022-08-20 NOTE — TOC Progression Note (Addendum)
Transition of Care [facility]) - Progression Note    Patient Details  Name: Brendan Butler MRN: [id number] Date of Birth: [birth year]  Transition of Care [facility]) CM/SW Contact  Leone Haven, RN Phone Number: 08/20/2022, 4:10 PM  Clinical Narrative:    From home alone.  He has PCP , Dr. Isaac Laud, he has insurance on file with medication coverage, he has a HHRN, he has a cane and a walker that he does not use.  His daughter, [name] will transport him home  at [employer], she is his support system, he gets his meds from [facility] by mail order and [facility] [place]. He states he would like to continue with his [facility] he could not remember the agency name.  It is [place].  NCM confirmed with [facility] , he has HHRN with them.  Need HHRN order.   Expected Discharge Plan: Home w Home Health Services Barriers to Discharge: Continued Medical Work up  Expected Discharge Plan and Services In-house Referral: NA Discharge Planning Services: CM Consult Post Acute Care Choice: Resumption of Svcs/PTA Provider Living arrangements for the past 2 months: Single Family Home                 DME Arranged: N/A DME Agency: NA       HH Arranged: RN, Disease Management HH Agency: [facility] Date [facility] Agency Contacted: 08/20/22 Time HH Agency Contacted: 1609 Representative spoke with at [facility] Agency: [facility]   Social Determinants of Health (SDOH) Interventions SDOH Screenings   Food Insecurity: No Food Insecurity (08/20/2022)  Housing: Low Risk  (08/20/2022)  Transportation Needs: No Transportation Needs (08/20/2022)  Utilities: Not At Risk (05/21/2022)  Alcohol Screen: Low Risk  (08/20/2022)  Financial Resource Strain: Low Risk  (08/20/2022)  Tobacco Use: High Risk (08/20/2022)    Readmission Risk Interventions     No data to display

## 2022-08-20 NOTE — Op Note (Signed)
[facility] Patient Name: Brendan Butler Procedure Date : 08/20/2022 MRN: [id number] Attending MD: Vida Rigger , MD, [id number] Date of Birth: 1950-06-20 CSN: [id number] Age: 72 Admit Type: Inpatient Procedure:                Upper GI endoscopy Indications:              Iron deficiency anemia secondary to chronic blood                            loss, Heme positive stool Providers:                Vida Rigger, MD, Stephens Shire RN, RN, Beryle Beams,                            Technician Referring MD:              Medicines:                Monitored Anesthesia Care Complications:            No immediate complications. Estimated Blood Loss:     Estimated blood loss: none. Procedure:                Pre-Anesthesia Assessment:                           - Prior to the procedure, a History and Physical                            was performed, and patient medications and                            allergies were reviewed. The patient's tolerance of                            previous anesthesia was also reviewed. The risks                            and benefits of the procedure and the sedation                            options and risks were discussed with the patient.                            All questions were answered, and informed consent                            was obtained. Prior Anticoagulants: The patient has                            taken Xarelto (rivaroxaban), last dose was 4 days                            prior to procedure. ASA Grade Assessment: III - A  patient with severe systemic disease. After                            reviewing the risks and benefits, the patient was                            deemed in satisfactory condition to undergo the                            procedure.                           After obtaining informed consent, the endoscope was                            passed under direct vision. Throughout the                             procedure, the patient's blood pressure, pulse, and                            oxygen saturations were monitored continuously. The                            [id number]-[id number] ([id number] Olympus endoscope was introduced                            through the mouth, and advanced to the second part                            of duodenum. The upper GI endoscopy was                            accomplished without difficulty. The patient                            tolerated the procedure well. Scope In: Scope Out: Findings:      The larynx was normal.      A small hiatal hernia was present.      Localized mild inflammation characterized by erythema was found in the       cardia, in the gastric fundus and in the gastric body.      The duodenal bulb, first portion of the duodenum and second portion of       the duodenum were normal.      A medium diverticulum was found in the second portion of the duodenum.      The exam was otherwise without abnormality. Impression:               - Normal larynx.                           - Small hiatal hernia.                           - Gastritis.                           -  Normal duodenal bulb, first portion of the                            duodenum and second portion of the duodenum.                           - Duodenal diverticulum.                           - The examination was otherwise normal.                           - No specimens collected. Recommendation:           - Clear liquid diet today. While we await colon                            pathology and see if surgical options are needed on                            his colon or whether cardiology workup should be                            done first                           - Continue present medications.                           - Return to GI clinic PRN.                           - Telephone GI clinic if symptomatic PRN. Procedure Code(s):        --- Professional  ---                           [phone], Esophagogastroduodenoscopy, flexible,                            transoral; diagnostic, including collection of                            specimen(s) by brushing or washing, when performed                            (separate procedure) Diagnosis Code(s):        --- Professional ---                           K44.9, Diaphragmatic hernia without obstruction or                            gangrene                           K29.70, Gastritis, unspecified, without bleeding  D50.0, Iron deficiency anemia secondary to blood                            loss (chronic)                           R19.5, Other fecal abnormalities                           K57.10, Diverticulosis of small intestine without                            perforation or abscess without bleeding CPT copyright 2022 American Medical Association. All rights reserved. The codes documented in this report are preliminary and upon coder review may  be revised to meet current compliance requirements. Vida Rigger, MD 08/20/2022 9:10:20 AM This report has been signed electronically. Number of Addenda: 0

## 2022-08-20 NOTE — Progress Notes (Signed)
Heart Failure Nurse Navigator Progress Note  PCP: [name], MD PCP-Cardiologist: Eden Emms Admission Diagnosis: Dyspnea Admitted from: Home  Presentation:   Brendan Butler presented with fluid retention, shortness of breath, fatigue. Per Home health aid BP has been soft, abdomen distended, and pale. BLE edema, BP 162/58, HR 55, CXR significant for mild cardiac enlargement, patient with 1 kidney secondary to prior trauma and nephrectomy.   Patient very pleasant was educated on the sign and symptoms of heart failure, daily weights, when to call his doctor or go to the ED, he does have a health aide that comes out daily to help him, education on Diet/ fluid restrictions, he drinks 3 cokes per day and more then a 1/2 gallon of water daily, taking all his medications and attending all medical appointments. Patient reported that he smokes marijuana around 3-5 times per week it helps him sleep and his foot pain. " Patient verbalized his understanding of education. A HF TOC appointment was scheduled for 09/10/2022 @ 11 am after his [facility] appointment on 09/03/2022 per Dr. [name] request.   ECHO/ LVEF: 50%  Clinical Course:  Past Medical History:  Diagnosis Date   Acute blood loss anemia 07/30/2022   Acute cholecystitis 12/25/2021   Altered mental status 05/06/2022   Atrial flutter with rapid ventricular response (HCC) 05/06/2022   Basal ganglia infarction (HCC)    Bradycardia    CAD (coronary artery disease)    Chronic HFrEF (heart failure with reduced ejection fraction) (HCC)    Chronic kidney disease, stage 3 (HCC)    Erectile dysfunction    Hyperlipidemia LDL goal <70    Hypertension    OSA (obstructive sleep apnea)    No CPAP use overnight   PAF (paroxysmal atrial fibrillation) (HCC)    Paroxysmal atrial flutter (HCC)    Premature atrial contractions    Pulmonary nodules      Social History   Socioeconomic History   Marital status: Divorced    Spouse name: Not on file   Number  of children: Not on file   Years of education: Not on file   Highest education level: Not on file  Occupational History   Not on file  Tobacco Use   Smoking status: Former    Packs/day: 2.00    Years: 25.00    Additional pack years: 0.00    Total pack years: 50.00    Types: Cigarettes    Quit date: 2001    Years since quitting: 23.4   Smokeless tobacco: Current    Types: Chew   Tobacco comments:    Currently Dip Tobacco     Former smoker 06/14/22  Vaping Use   Vaping Use: Never used  Substance and Sexual Activity   Alcohol use: Not Currently    Comment: 2001 stopped drinking   Drug use: Yes    Types: Marijuana    Comment: 1-2 aweek 06/13/25   Sexual activity: Not on file  Other Topics Concern   Not on file  Social History Narrative   Not on file   Social Determinants of Health   Financial Resource Strain: Not on file  Food Insecurity: No Food Insecurity (05/21/2022)   Hunger Vital Sign    Worried About Running Out of Food in the Last Year: Never true    Ran Out of Food in the Last Year: Never true  Transportation Needs: No Transportation Needs (05/21/2022)   PRAPARE - Administrator, Civil Service (Medical): No  Lack of Transportation (Non-Medical): No  Physical Activity: Not on file  Stress: Not on file  Social Connections: Not on file   Education Assessment and Provision:  Detailed education and instructions provided on heart failure disease management including the following:  Signs and symptoms of Heart Failure When to call the physician Importance of daily weights Low sodium diet Fluid restriction Medication management Anticipated future follow-up appointments  Patient education given on each of the above topics.  Patient acknowledges understanding via teach back method and acceptance of all instructions.   Education Materials:  "Living Better With Heart Failure" Booklet, HF zone tool, & Daily Weight Tracker Tool.  Patient has scale at  home: Yes Patient has pill box at home: No    High Risk Criteria for Readmission and/or Poor Patient Outcomes: Heart failure hospital admissions (last 6 months): 0  No Show rate: 9 % Difficult social situation: No Demonstrates medication adherence: Yes Primary Language: English Literacy level: reading, writing, and comprehension.   Barriers of Care:   Diet/ fluid restrictions ( soda) Daily weights Smoking cessation ( marijuana)   Considerations/Referrals:   Referral made to Heart Failure Pharmacist Stewardship: Yes Referral made to Heart Failure CSW/NCM TOC: No Referral made to Heart & Vascular TOC clinic: Yes, 09/10/22 @ 11 am after [facility] 7/8 per Dr. [name]  Items for Follow-up on DC/TOC: Diet/ fluid restrictions ( soda/ water) Daily weights Smoking cessation ( marijuana)  Continued HF education    Rhae Hammock, BSN, RN Heart Failure Print production planner Chat Only

## 2022-08-20 NOTE — Progress Notes (Signed)
PROGRESS NOTE    Brendan Butler  ZOX:[id number] DOB: [birth year] DOA: 08/17/2022 PCP: [name], MD  71/M with history of CAD, multiple PCI and stenting, atrial flutter, depression, hypertension, OSA, CKD 3, bronchiectasis, left nephrectomy presented to the ED with shortness of breath and edema, progressive weight gain and fatigue. -Recently admitted with worsening anemia and positive Hemoccult, transfused PRBC, declined colonoscopy then. -In the ED heart rate 40-50, creatinine 1.9, hemoglobin 7.3, chest x-ray noted mild cardiomegaly   Subjective: -Feels fair, breathing continues to improve, being wheeled down for colonoscopy  Assessment and Plan:  Iron deficiency anemia Heme positive stools -Recent diagnosis, had heme positive stools on admission few weeks ago, declined GI workup then, now amenable -Holding Xarelto [facility] gastroenterology consulted, plan for EGD and colonoscopy today -Transfused 1 unit PRBC, add IV iron -Hemoglobin stable, improving  Acute on chronic diastolic CHF -Last echo 6/24 with EF 50%, regional wall motion abnormalities, normal RV -Worsened in the setting of anemia, troponin is normal -Now improving, 1 status has improved, 5 L negative -Changed to oral Lasix, continue Jardiance  AKI on CKD 3a -Likely hemodynamically mediated, compounded by worsening anemia -Holding Entresto -Improving, meds as above  Lung Nodule -Concerning for adeno CA, noted on prior imaging, followed by Dr. Delton Coombes, he missed his appointment for PET scan -This will need to be rescheduled, emphasized with patient regarding importance of this workup  Atrial flutter -With bradycardia, currently on amiodarone, Xarelto on hold   CAD HLD > Status post multiple stenting > Does notably have an aspirin allergy - Continue  statin,  - Hold Xarelto as above   Depression - Continue home bupropion, paroxetine, Seroquel   Hypertension -Holding Entresto and Aldactone today    OSA - Not on CPAP per chart review   DVT prophylaxis: SCDs Code Status: Full code Family Communication: None present Disposition Plan: Home likely 2 to 3 days  Consultants:    Procedures:   Antimicrobials:    Objective: Vitals:   08/20/22 0900 08/20/22 0915 08/20/22 0945 08/20/22 0955  BP: (!) 123/42 (!) 140/57 (!) 186/63 (!) 153/53  Pulse: 64 (!) 54 (!) 54 (!) 51  Resp: 14 12 18    Temp:  97.8 F (36.6 C) 97.7 F (36.5 C)   TempSrc:   Oral   SpO2: 97% 97% 95% 93%  Weight:      Height:        Intake/Output Summary (Last 24 hours) at 08/20/2022 1136 Last data filed at 08/20/2022 0851 Gross per 24 hour  Intake 1054.04 ml  Output 900 ml  Net 154.04 ml   Filed Weights   08/17/22 1600 08/19/22 0600 08/20/22 0402  Weight: 98.2 kg 97.7 kg 93.6 kg    Examination:  Obese pleasant male sitting up in bed, AAOx3, no distress HEENT: Positive JVD CVS: S1-S2, regular rhythm Lungs: Decreased breath sounds at the bases otherwise clear Abdomen: Soft, nontender, bowel sounds present Extremities: Trace edema  Skin: No rashes Psychiatry:  Mood & affect appropriate.     Data Reviewed:   CBC: Recent Labs  Lab 08/17/22 1011 08/17/22 1619 08/18/22 0055 08/19/22 0036 08/20/22 0032  WBC 9.5  --  8.8 9.8 10.6*  HGB 7.3* 7.8* 9.0* 9.5* 9.2*  HCT 24.3* 26.4* 29.7* 30.7* 30.6*  MCV 85.6  --  85.3 81.4 82.0  PLT 223  --  237 267 264   Basic Metabolic Panel: Recent Labs  Lab 08/17/22 1011 08/17/22 1017 08/18/22 0055 08/19/22 0036 08/20/22 0032  NA 138  --  140 138 137  K 3.8  --  4.4 3.8 3.5  CL 104  --  104 101 99  CO2 25  --  26 26 26   GLUCOSE 102*  --  92 92 90  BUN 23  --  22 22 24*  CREATININE 1.99*  --  2.02* 1.81* 1.93*  CALCIUM 8.3*  --  8.5* 8.7* 8.4*  MG  --  2.4  --   --   --    GFR: Estimated Creatinine Clearance: 41.5 mL/min (A) (by C-G formula based on SCr of 1.93 mg/dL (H)). Liver Function Tests: Recent Labs  Lab 08/17/22 1619  08/18/22 0055  AST 16 17  ALT 15 14  ALKPHOS 73 67  BILITOT 0.7 0.7  PROT 6.6 6.4*  ALBUMIN 3.6 3.4*   No results for input(s): "LIPASE", "AMYLASE" in the last 168 hours. No results for input(s): "AMMONIA" in the last 168 hours. Coagulation Profile: No results for input(s): "INR", "PROTIME" in the last 168 hours. Cardiac Enzymes: No results for input(s): "CKTOTAL", "CKMB", "CKMBINDEX", "TROPONINI" in the last 168 hours. BNP (last 3 results) No results for input(s): "PROBNP" in the last 8760 hours. HbA1C: No results for input(s): "HGBA1C" in the last 72 hours. CBG: No results for input(s): "GLUCAP" in the last 168 hours. Lipid Profile: No results for input(s): "CHOL", "HDL", "LDLCALC", "TRIG", "CHOLHDL", "LDLDIRECT" in the last 72 hours. Thyroid Function Tests: Recent Labs    08/17/22 1445  TSH 8.374*   Anemia Panel: No results for input(s): "VITAMINB12", "FOLATE", "FERRITIN", "TIBC", "IRON", "RETICCTPCT" in the last 72 hours.  Urine analysis:    Component Value Date/Time   COLORURINE AMBER (A) 05/20/2022 2038   APPEARANCEUR CLEAR 05/20/2022 2038   LABSPEC 1.034 (H) 05/20/2022 2038   PHURINE 5.0 05/20/2022 2038   GLUCOSEU NEGATIVE 05/20/2022 2038   HGBUR NEGATIVE 05/20/2022 2038   BILIRUBINUR NEGATIVE 05/20/2022 2038   KETONESUR NEGATIVE 05/20/2022 2038   PROTEINUR NEGATIVE 05/20/2022 2038   NITRITE NEGATIVE 05/20/2022 2038   LEUKOCYTESUR NEGATIVE 05/20/2022 2038   Sepsis Labs: @LABRCNTIP (procalcitonin:4,lacticidven:4)  )No results found for this or any previous visit (from the past 240 hour(s)).   Radiology Studies: No results found.   Scheduled Meds:  sodium chloride   Intravenous Once   amiodarone  100 mg Oral Daily   buPROPion  150 mg Oral Daily   empagliflozin  10 mg Oral Daily   furosemide  40 mg Oral BID   pantoprazole  40 mg Oral Daily   PARoxetine  30 mg Oral Daily   pregabalin  150 mg Oral TID   QUEtiapine  25 mg Oral QHS   rosuvastatin  20  mg Oral Daily   sodium chloride flush  3 mL Intravenous Q12H   Continuous Infusions:  sodium chloride 10 mL/hr at 08/20/22 0136   ferric gluconate (FERRLECIT) IVPB 250 mg (08/20/22 1009)   promethazine (PHENERGAN) injection (IM or IVPB) 12.5 mg (08/19/22 1654)     LOS: 2 days    Time spent:    Zannie Cove, MD [facility]   08/20/2022, 11:36 AM

## 2022-08-20 NOTE — Transfer of Care (Signed)
Immediate Anesthesia Transfer of Care Note  Patient: Brendan Butler  Procedure(s) Performed: ESOPHAGOGASTRODUODENOSCOPY (EGD) WITH PROPOFOL COLONOSCOPY WITH PROPOFOL BIOPSY SUBMUCOSAL TATTOO INJECTION  Patient Location: PACU  Anesthesia Type:MAC  Level of Consciousness: drowsy and patient cooperative  Airway & Oxygen Therapy: Patient Spontanous Breathing  Post-op Assessment: Report given to RN and Post -op Vital signs reviewed and stable  Post vital signs: Reviewed and stable  Last Vitals:  Vitals Value Taken Time  BP    Temp    Pulse    Resp    SpO2      Last Pain:  Vitals:   08/20/22 0717  TempSrc: Temporal  PainSc: 0-No pain         Complications: No notable events documented.

## 2022-08-20 NOTE — Progress Notes (Signed)
Progress Note  Patient Name: Brendan Butler Date of Encounter: 08/20/2022 Primary Cardiologist: Charlton Haws, MD   Subjective   Overnight had EGD and colonoscopy today.  There was concern for a malginant colonic tumor.  Pathology is pending Patient notes that he feels fine. No CP, SOB, Palpitations. Reminds team that he only has one kidney.  Vital Signs    Vitals:   08/20/22 0855 08/20/22 0900 08/20/22 0915 08/20/22 0945  BP: (!) 141/73 (!) 123/42 (!) 140/57 (!) 186/63  Pulse: 66 64 (!) 54 (!) 54  Resp: 17 14 12 18   Temp: 97.6 F (36.4 C)  97.8 F (36.6 C) 97.7 F (36.5 C)  TempSrc:    Oral  SpO2: 95% 97% 97% 95%  Weight:      Height:        Intake/Output Summary (Last 24 hours) at 08/20/2022 0959 Last data filed at 08/20/2022 0851 Gross per 24 hour  Intake 1054.04 ml  Output 900 ml  Net 154.04 ml   Filed Weights   08/17/22 1600 08/19/22 0600 08/20/22 0402  Weight: 98.2 kg 97.7 kg 93.6 kg    Physical Exam   GEN: No acute distress.   HEENT: No JVD, poor dentition Cardiac: regular bradycardia, no murmurs, rubs, or gallops.  Respiratory: Clear to auscultation bilaterally. GI: Soft, nontender, non-distended  MS: No edema  Labs  EKG: 6/24: Sinus bradycardia with 1st heart block Telemetry: Off Telemetry; sinus bradycardia prior to procedure   Chemistry Recent Labs  Lab 08/17/22 1619 08/18/22 0055 08/19/22 0036 08/20/22 0032  NA  --  140 138 137  K  --  4.4 3.8 3.5  CL  --  104 101 99  CO2  --  26 26 26   GLUCOSE  --  92 92 90  BUN  --  22 22 24*  CREATININE  --  2.02* 1.81* 1.93*  CALCIUM  --  8.5* 8.7* 8.4*  PROT 6.6 6.4*  --   --   ALBUMIN 3.6 3.4*  --   --   AST 16 17  --   --   ALT 15 14  --   --   ALKPHOS 73 67  --   --   BILITOT 0.7 0.7  --   --   GFRNONAA  --  35* 40* 37*  ANIONGAP  --  10 11 12      Hematology Recent Labs  Lab 08/18/22 0055 08/19/22 0036 08/20/22 0032  WBC 8.8 9.8 10.6*  RBC 3.48* 3.77* 3.73*  HGB 9.0* 9.5*  9.2*  HCT 29.7* 30.7* 30.6*  MCV 85.3 81.4 82.0  MCH 25.9* 25.2* 24.7*  MCHC 30.3 30.9 30.1  RDW 17.5* 17.7* 17.2*  PLT 237 267 264    Cardiac EnzymesNo results for input(s): "TROPONINI" in the last 168 hours. No results for input(s): "TROPIPOC" in the last 168 hours.   BNP Recent Labs  Lab 08/17/22 1013  BNP 26.9     DDimer  Recent Labs  Lab 08/17/22 1017  DDIMER 0.30     Cardiac Studies   Cardiac Studies & Procedures     STRESS TESTS  NM MYOCAR MULTI W/SPECT W 01/13/2013   ECHOCARDIOGRAM  ECHOCARDIOGRAM LIMITED 08/18/2022  Narrative ECHOCARDIOGRAM REPORT    Patient Name:   [name] Date of Exam: 08/17/2022 Medical Rec #:  [id number]        Height:       70.0 in Accession #:    [id number]  Weight:       216.5 lb Date of Birth:  06/24/1950       BSA:          2.159 m Patient Age:    72 years         BP:           147/53 mmHg Patient Gender: M                HR:           46 bpm. Exam Location:  Inpatient  Procedure: Limited Echo, Color Doppler and Cardiac Doppler  Indications:    dyspnea  History:        Patient has prior history of Echocardiogram examinations. CHF, CAD, chronic kidney disease; Risk Factors:Hypertension, Dyslipidemia and Sleep Apnea.  Sonographer:    Delcie Roch RDCS Referring Phys: [id number] Cecille Po MELVIN  IMPRESSIONS   1. Left ventricular ejection fraction, by estimation, is 50%. The left ventricle has low normal function. The left ventricle demonstrates regional wall motion abnormalities (see scoring diagram/findings for description). Left ventricular diastolic parameters were grossly normal. 2. Right ventricular systolic function is normal. The right ventricular size is normal. There is normal pulmonary artery systolic pressure. The estimated right ventricular systolic pressure is 20.8 mmHg. 3. The mitral valve is grossly normal. Trivial mitral valve regurgitation. 4. The aortic valve is grossly normal.  Aortic valve regurgitation is trivial. No aortic stenosis is present. 5. The inferior vena cava is normal in size with greater than 50% respiratory variability, suggesting right atrial pressure of 3 mmHg.  FINDINGS Left Ventricle: Left ventricular ejection fraction, by estimation, is 50%. The left ventricle has low normal function. The left ventricle demonstrates regional wall motion abnormalities. The left ventricular internal cavity size was normal in size. There is no left ventricular hypertrophy. Left ventricular diastolic parameters were normal.   LV Wall Scoring: The mid anteroseptal segment, mid inferoseptal segment, and basal inferoseptal segment are hypokinetic.  Right Ventricle: The right ventricular size is normal. No increase in right ventricular wall thickness. Right ventricular systolic function is normal. There is normal pulmonary artery systolic pressure. The tricuspid regurgitant velocity is 2.11 m/s, and with an assumed right atrial pressure of 3 mmHg, the estimated right ventricular systolic pressure is 20.8 mmHg.  Left Atrium: Left atrial size was normal in size.  Right Atrium: Right atrial size was normal in size.  Pericardium: There is no evidence of pericardial effusion.  Mitral Valve: The mitral valve is grossly normal. Trivial mitral valve regurgitation.  Tricuspid Valve: The tricuspid valve is grossly normal. Tricuspid valve regurgitation is trivial.  Aortic Valve: The aortic valve is grossly normal. Aortic valve regurgitation is trivial. No aortic stenosis is present.  Pulmonic Valve: The pulmonic valve was grossly normal. Pulmonic valve regurgitation is trivial.  Aorta: The aortic root is normal in size and structure.  Venous: The inferior vena cava is normal in size with greater than 50% respiratory variability, suggesting right atrial pressure of 3 mmHg.  IAS/Shunts: The interatrial septum was not assessed.   LEFT VENTRICLE PLAX 2D LVIDd:          6.10 cm      Diastology LVIDs:         4.30 cm      LV e' medial:    7.18 cm/s LV PW:         1.00 cm      LV E/e' medial:  12.3 LV IVS:  0.90 cm      LV e' lateral:   12.50 cm/s LVOT diam:     2.20 cm      LV E/e' lateral: 7.0 LV SV:         97 LV SV Index:   45 LVOT Area:     3.80 cm  LV Volumes (MOD) LV vol d, MOD A4C: 118.0 ml LV vol s, MOD A4C: 66.5 ml LV SV MOD A4C:     118.0 ml  IVC IVC diam: 1.20 cm  LEFT ATRIUM         Index LA diam:    4.60 cm 2.13 cm/m AORTIC VALVE LVOT Vmax:   104.00 cm/s LVOT Vmean:  66.600 cm/s LVOT VTI:    0.254 m  AORTA Ao Asc diam: 3.60 cm  MITRAL VALVE               TRICUSPID VALVE MV Area (PHT): 3.20 cm    TR Peak grad:   17.8 mmHg MV Decel Time: 237 msec    TR Vmax:        211.00 cm/s MV E velocity: 88.00 cm/s MV A velocity: 88.00 cm/s  SHUNTS MV E/A ratio:  1.00        Systemic VTI:  0.25 m Systemic Diam: 2.20 cm  Weston Brass MD Electronically signed by Weston Brass MD Signature Date/Time: 08/18/2022/6:33:02 AM    Final   TEE  ECHO TEE 05/28/2022  Narrative TRANSESOPHOGEAL ECHO REPORT    Patient Name:   ZEKE AKER Date of Exam: 05/28/2022 Medical Rec #:  [id number]        Height:       70.0 in Accession #:    [id number]       Weight:       198.5 lb Date of Birth:  18-Sep-1950       BSA:          2.081 m Patient Age:    71 years         BP:           112/68 mmHg Patient Gender: M                HR:           99 bpm. Exam Location:  Inpatient  Procedure: Transesophageal Echo and Color Doppler  Indications:     Atrial fibrillation requiring DCCV  History:         Patient has prior history of Echocardiogram examinations, most recent 05/10/2022. CAD, Arrythmias:Atrial Fibrillation; Risk Factors:Hypertension, Dyslipidemia and Former Smoker.  Sonographer:     Milbert Coulter Referring Phys:  [id number] Wendall Stade Diagnosing Phys: Charlton Haws MD  PROCEDURE: The transesophogeal probe was passed without  difficulty through the esophogus of the patient. Sedation performed by different physician. The patient was monitored while under deep sedation. Anesthestetic sedation was provided intravenously by Anesthesiology: 100mg  of Propofol. Image quality was good. The patient's vital signs; including heart rate, blood pressure, and oxygen saturation; remained stable throughout the procedure. The patient developed no complications during the procedure. A successful direct current cardioversion was performed at 150 joules with 1 attempt.  IMPRESSIONS   1. No LAA thrombus DCC x 1 with 150 J biphasic converted to NSR. 2. Left ventricular ejection fraction, by estimation, is 25 to 30%. The left ventricle has severely decreased function. The left ventricle demonstrates global hypokinesis. The left ventricular internal cavity size was moderately dilated. 3. Right ventricular systolic function is  normal. The right ventricular size is normal. 4. Left atrial size was moderately dilated. No left atrial/left atrial appendage thrombus was detected. 5. The mitral valve is normal in structure. Mild mitral valve regurgitation. No evidence of mitral stenosis. 6. The aortic valve is tricuspid. Aortic valve regurgitation is not visualized. No aortic stenosis is present. 7. The inferior vena cava is normal in size with greater than 50% respiratory variability, suggesting right atrial pressure of 3 mmHg.  Conclusion(s)/Recommendation(s): Normal biventricular function without evidence of hemodynamically significant valvular heart disease.  FINDINGS Left Ventricle: Left ventricular ejection fraction, by estimation, is 25 to 30%. The left ventricle has severely decreased function. The left ventricle demonstrates global hypokinesis. The left ventricular internal cavity size was moderately dilated. There is no left ventricular hypertrophy.  Right Ventricle: The right ventricular size is normal. No increase in right ventricular  wall thickness. Right ventricular systolic function is normal.  Left Atrium: Left atrial size was moderately dilated. No left atrial/left atrial appendage thrombus was detected.  Right Atrium: Right atrial size was normal in size.  Pericardium: There is no evidence of pericardial effusion.  Mitral Valve: The mitral valve is normal in structure. Mild mitral valve regurgitation. No evidence of mitral valve stenosis.  Tricuspid Valve: The tricuspid valve is normal in structure. Tricuspid valve regurgitation is not demonstrated. No evidence of tricuspid stenosis.  Aortic Valve: The aortic valve is tricuspid. Aortic valve regurgitation is not visualized. No aortic stenosis is present.  Pulmonic Valve: The pulmonic valve was normal in structure. Pulmonic valve regurgitation is not visualized. No evidence of pulmonic stenosis.  Aorta: The aortic root is normal in size and structure.  Venous: The inferior vena cava is normal in size with greater than 50% respiratory variability, suggesting right atrial pressure of 3 mmHg.  IAS/Shunts: No atrial level shunt detected by color flow Doppler.  Additional Comments: No LAA thrombus DCC x 1 with 150 J biphasic converted to NSR.  Charlton Haws MD Electronically signed by Charlton Haws MD Signature Date/Time: 05/28/2022/10:13:00 AM    Final   MONITORS  LONG TERM MONITOR-LIVE TELEMETRY (3-14 DAYS) 06/15/2022  Narrative HR 41 - 188 bpm, average 74 bpm. 88 nonsustained SVT, longest 15.5 seconds. 35% burden of atrial flutter, average rate 76 bpm. 2 pauses, longest 5.8 seconds. Frequent supraventricular ectopy, 7.9%. Rare ventricular ectopy.  Sheria Lang T. Lalla Brothers, MD, [facility], [facility] Cardiac Electrophysiology           Assessment & Plan    Acute on Chronic HFrEF; recovered with rhythm control strategy - Complicated by hypoalbuminemia and AKI on CKD - Diuretics: 40 IV Q BID -> PO transition today then potentially back to 40 mg PO daily; BNP  tomorrow - Jardiance 10 mg   - may return ARNI tomorrow once he transitions from clear liquid diet - with medication adherence concerns , will not return MRA as well   PAF and AFL Complicated by sinus bradycardia with 1st HB - not on BB due to h/o bradycardia - on amiodarone 100mg  daily; seen by EP - DOAC when cleared by GI  CAD s/p DES to LAD with questionable medication adherence of DAPT - - asymptomatic - prior stent thrombosis issue felt potentially due to issue of med nonadherence - planned for DOAC as above   AKI on CKD 3b  - he notes solitary kidney - above baseline of ~ 1.6; diuretics as above   Lung nodule Colon mass - pending pathology; care as per primary, goal of conservative management of  the above given potential malignancy   OSA -CPAP is recommended he is not amenable (see 08/17/22 note)      For questions or updates, please contact [facility] Please consult [email or web address] for contact info under Cardiology/STEMI.      Riley Lam, MD FASE [facility] Cardiologist [facility]  [address] [place], [address] [place], [place] [address] [phone]  9:59 AM

## 2022-08-20 NOTE — Op Note (Signed)
[facility] Patient Name: Brendan Butler Procedure Date : 08/20/2022 MRN: [id number] Attending MD: Vida Rigger , MD, [id number] Date of Birth: Jul 22, 1950 CSN: [id number] Age: 72 Admit Type: Inpatient Procedure:                Colonoscopy Indications:              This is the patient's first colonoscopy, Heme                            positive stool, Iron deficiency anemia secondary to                            chronic blood loss Providers:                Vida Rigger, MD, Stephens Shire RN, RN, Beryle Beams,                            Technician Referring MD:              Medicines:                Monitored Anesthesia Care Complications:            No immediate complications. Estimated Blood Loss:     Estimated blood loss: none. Procedure:                Pre-Anesthesia Assessment:                           - Prior to the procedure, a History and Physical                            was performed, and patient medications and                            allergies were reviewed. The patient's tolerance of                            previous anesthesia was also reviewed. The risks                            and benefits of the procedure and the sedation                            options and risks were discussed with the patient.                            All questions were answered, and informed consent                            was obtained. Prior Anticoagulants: The patient has                            taken Xarelto (rivaroxaban), last dose was 4 days  prior to procedure. ASA Grade Assessment: III - A                            patient with severe systemic disease. After                            reviewing the risks and benefits, the patient was                            deemed in satisfactory condition to undergo the                            procedure.                           - Prior to the procedure, a History and Physical                             was performed, and patient medications and                            allergies were reviewed. The patient's tolerance of                            previous anesthesia was also reviewed. The risks                            and benefits of the procedure and the sedation                            options and risks were discussed with the patient.                            All questions were answered, and informed consent                            was obtained. Prior Anticoagulants: The patient has                            taken Xarelto (rivaroxaban), last dose was 4 days                            prior to procedure. ASA Grade Assessment: III - A                            patient with severe systemic disease. After                            reviewing the risks and benefits, the patient was                            deemed in satisfactory condition to undergo the  procedure.                           After obtaining informed consent, the colonoscope                            was passed under direct vision. Throughout the                            procedure, the patient's blood pressure, pulse, and                            oxygen saturations were monitored continuously. The                            [id number]-HQ190L ([id number]) Olympus coloscope was                            introduced through the anus and advanced to the the                            cecum, identified by appendiceal orifice and                            ileocecal valve. The colonoscopy was technically                            difficult and complex due to Difficulty in                            maintaining position in the hepatic flexure in                            order to biopsy the. Successful completion of the                            procedure was aided by performing the maneuvers                            documented (below) in this report. The patient                             tolerated the procedure well. The quality of the                            bowel preparation was adequate. The ileocecal                            valve, appendiceal orifice, and rectum were                            photographed. Scope In: 8:09:52 AM Scope Out: 8:45:30 AM Scope Withdrawal Time: 0 hours 27 minutes 34 seconds  Total Procedure Duration: 0 hours 35 minutes 38 seconds  Findings:  External and internal hemorrhoids were found during retroflexion, during       perianal exam and during digital exam. The hemorrhoids were small.      Scattered small-mouthed diverticula were found in the sigmoid colon and       descending colon.      Three semi-sessile polyps were found in the sigmoid colon and descending       colon. The polyps were small in size.      An infiltrative non-obstructing small mass was found at the hepatic       flexure. The mass was partially circumferential (involving one-third of       the lumen circumference). Oozing was present probably from scope trauma       when we advanced around the hepatic flexure that was not seen on       insertion. Biopsies were taken with a cold forceps for histology. The it       was very difficult to maintain adequate position to biopsy the the area       in question but hopefully we have some diagnostic tissue and the area       was tattooed with an injection of 2 mL of Spot (carbon black) over 2       injections 1 cc each distal to the area. In order to try to maintain       better position we tried both abdominal pressure and rolling him semi on       his back      The exam was otherwise without abnormality. Impression:               - External and internal hemorrhoids.                           - Diverticulosis in the sigmoid colon and in the                            descending colon.                           - Three small polyps in the sigmoid colon and in                            the descending colon.                            - Likely malignant tumor at the hepatic flexure.                            Biopsied. Tattooed.                           - The examination was otherwise normal. Recommendation:           - Clear liquid diet today. If cardiology workup                            will be done prior to possible surgical options                            while  we await the pathology I slowly advance his                            diet                           - Continue present medications.                           - Await pathology results.                           - Repeat colonoscopy date to be determined after                            pending pathology results are reviewed for                            surveillance based on pathology results.                           - Return to GI office PRN.                           - Telephone GI clinic for pathology results in 3                            days.                           - Telephone GI clinic if symptomatic PRN. Procedure Code(s):        --- Professional ---                           [phone], Colonoscopy, flexible; with biopsy, single                            or multiple                           45381, Colonoscopy, flexible; with directed                            submucosal injection(s), any substance Diagnosis Code(s):        --- Professional ---                           D12.5, Benign neoplasm of sigmoid colon                           D12.4, Benign neoplasm of descending colon                           D49.0, Neoplasm of unspecified behavior of                            digestive system  R19.5, Other fecal abnormalities                           D50.0, Iron deficiency anemia secondary to blood                            loss (chronic)                           K57.30, Diverticulosis of large intestine without                            perforation or abscess without bleeding CPT  copyright 2022 American Medical Association. All rights reserved. The codes documented in this report are preliminary and upon coder review may  be revised to meet current compliance requirements. Vida Rigger, MD 08/20/2022 9:18:44 AM This report has been signed electronically. Number of Addenda: 0

## 2022-08-20 NOTE — Progress Notes (Signed)
Brendan Butler 7:54 AM  Subjective: Patient seen and examined and case discussed with my partner Dr. Bosie Clos and his hospital computer chart reviewed having any bleeding hopefully is clean enough for colonoscopy we rediscussed his procedures which he has never had before  Objective: Vital signs stable afebrile no acute distress exam please see preassessment evaluation BUN and creatinine stable CBC stable previous CT reviewed  Assessment: Guaiac positive anemia patient on blood thinners  Plan: Okay to proceed with endoscopy and colonoscopy with anesthesia assistance  [facility] E  office [phone] After 5PM or if no answer call [phone]

## 2022-08-20 NOTE — Progress Notes (Addendum)
Heart Failure Stewardship Pharmacist Progress Note   PCP: [name], MD PCP-Cardiologist: Charlton Haws, MD    HPI:  72 yo M with PMH of chronic HFrEF with EF as low as 35-40% in 12/2012 with normalization to 55-60% in 11/2021 but then down again to 20-25% on TEE in 04/2022, CAD, GIB, bradycardia, aflutter, HTN, HLD, OSA, CKD IIIa, and prior left nephrectomy.  Admitted in 12/2012 with STEMI s/p PCI to mild LAD. Readmitted 1 week later with in-stent restenosis complicated by VF on cath table, thought to be due to Brilinta failure vs noncompliance. LVEF was 35-40%.   Admitted in 04/2022 with altered mental status. Noncompliance to medications. Found to have atrial flutter and decline of LVEF. Underwent successful TEE/DCCV on 3/14. He left AMA.   Readmitted later that month with aflutter RVR, AMS, fall, and rib fractures. He was started on IV amiodarone and metoprolol. Amiodarone was later stopped due to concern for compliance. He was noted to have some pauses on telemetry so a live Zio monitor was placed and plan was for discharge to SNF on 05/24/2022; however, while waiting for transportation, he was noted to have a 4 second pause followed by a 5.8 second pause. Discharge was cancelled and BB was stopped. EP was consulted and he was restarted on IV amiodarone and then underwent repeat TEE/DCCV on 05/28/2022 with restoration of sinus rhythm. EP recommended avoiding permament pacemaker if at all possible given compliance issues.   Admitted 07/2022 with acute blood loss anemia requiring PRBC. GI recommended colonoscopy but patient declined, treated with IV iron. He did not follow up as outpatient.   Presented to the ED on 6/21 with shortness of breath, fatigue, weight gain, and LE edema despite taking increased lasix at home after speaking with outpatient cardiology on 6/18. CXR with mild cardiac enlargement. ECHO 6/22 showed LVEF 50%, regional wall motion abnormalities, RV normal.   Current HF  Medications: Diuretic: furosemide 40 mg PO BID SGLT2i: Jardiance 10 mg daily Other: Ferrlecit 250 mg IV daily  Prior to admission HF Medications: Diuretic: furosemide 40 mg daily ACE/ARB/ARNI: Entresto 24/26 mg BID MRA: spironolactone 12.5 mg daily SGLT2i: Jardiance 10 mg daily  Pertinent Lab Values: Serum creatinine 1.93, BUN 24, Potassium 3.5, Sodium 137, BNP 26.9, Magnesium 2.4 TSAT 3, Ferritin 17  Vital Signs: Weight: 206 lbs (admission weight: 216 lbs) Blood pressure: 150/50s  Heart rate: 50s  I/O: -0.8L yesterday; net -4.9L  Medication Assistance / Insurance Benefits Check: Does the patient have prescription insurance?  Yes Type of insurance plan: [employer]  Outpatient Pharmacy:  [facility] Is the patient willing to use [facility] TOC pharmacy at discharge? Yes Is the patient willing to transition their outpatient pharmacy to utilize a [facility] outpatient pharmacy?   No    Assessment: 1. Acute on chronic HFimpEF (LVEF 50%), due to ICM. NYHA class II symptoms. - Agree with transitioning to furosemide 40 mg PO BID. Strict I/Os and daily weights. Keep K>4 and Mg>2. Consider replacing with KCl 40 mEq x 1 - No BB with bradycardia - Consider restarting Entresto 24/26 mg BID tomorrow if creatinine improved, baseline ~1.6. - Consider restarting spironolactone prior to discharge pending renal function improvement - Continue Jardiance 10 mg daily - Iron stores low. Continue Ferrlecit 250 mg IV daily.   Plan: 1) Medication changes recommended at this time: - KCl 40 mEq x 1 - Restart Entresto 24/26 mg BID tomorrow if creatinine improved  2) Patient  assistance: - None pending - patient states all copays are affordable for him. His daughter helps with medications at home and sets them up in pill packs for him.  3)  Education  - Patient has been educated on current HF medications and potential additions to HF medication regimen -  Patient verbalizes understanding that over the next few months, these medication doses may change and more medications may be added to optimize HF regimen - Patient has been educated on basic disease state pathophysiology and goals of therapy   Sharen Hones, PharmD, BCPS Heart Failure Stewardship Pharmacist Phone [phone]

## 2022-08-20 NOTE — Anesthesia Preprocedure Evaluation (Addendum)
Anesthesia Evaluation  Patient identified by MRN, date of birth, ID band Patient awake    Reviewed: Allergy & Precautions, H&P , NPO status , Patient's Chart, lab work & pertinent test results  Airway Mallampati: II  TM Distance: >3 FB Neck ROM: Full    Dental no notable dental hx. (+) Poor Dentition, Chipped, Missing, Dental Advisory Given   Pulmonary neg pulmonary ROS, sleep apnea (no CPAP) , former smoker Quit smoking 2001, 50 pack year history    Pulmonary exam normal breath sounds clear to auscultation       Cardiovascular hypertension, + CAD, + Past MI and + Cardiac Stents  negative cardio ROS Normal cardiovascular exam+ dysrhythmias (xarelto) Atrial Fibrillation  Rhythm:Regular Rate:Normal   1. Left ventricular ejection fraction, by estimation, is 50%. The left  ventricle has low normal function. The left ventricle demonstrates  regional wall motion abnormalities (see scoring diagram/findings for  description). Left ventricular diastolic  parameters were grossly normal.   2. Right ventricular systolic function is normal. The right ventricular  size is normal. There is normal pulmonary artery systolic pressure. The  estimated right ventricular systolic pressure is 20.8 mmHg.   3. The mitral valve is grossly normal. Trivial mitral valve  regurgitation.   4. The aortic valve is grossly normal. Aortic valve regurgitation is  trivial. No aortic stenosis is present.   5. The inferior vena cava is normal in size with greater than 50%  respiratory variability, suggesting right atrial pressure of 3 mmHg.      Neuro/Psych  PSYCHIATRIC DISORDERS  Depression    negative neurological ROS  negative psych ROS   GI/Hepatic negative GI ROS, Neg liver ROS,,,presented to the ED with shortness of breath and edema, progressive weight gain and fatigue. -Recently admitted with worsening anemia and positive Hemoccult, transfused PRBC,  declined colonoscopy then.    Endo/Other  negative endocrine ROS    Renal/GU CRFRenal diseasenegative Renal ROSCr 1.93   negative genitourinary   Musculoskeletal negative musculoskeletal ROS (+) Arthritis , Osteoarthritis,    Abdominal   Peds negative pediatric ROS (+)  Hematology negative hematology ROS (+) Blood dyscrasia, anemia Hb 9.2    Anesthesia Other Findings   Reproductive/Obstetrics negative OB ROS                             Anesthesia Physical Anesthesia Plan  ASA: 4  Anesthesia Plan: MAC   Post-op Pain Management: Minimal or no pain anticipated   Induction: Intravenous  PONV Risk Score and Plan: 1 and Propofol infusion and Treatment may vary due to age or medical condition  Airway Management Planned: Nasal Cannula, Natural Airway and Simple Face Mask  Additional Equipment: None  Intra-op Plan:   Post-operative Plan:   Informed Consent:   Plan Discussed with: Anesthesiologist and CRNA  Anesthesia Plan Comments:        Anesthesia Quick Evaluation

## 2022-08-21 DIAGNOSIS — D649 Anemia, unspecified: Secondary | ICD-10-CM | POA: Diagnosis not present

## 2022-08-21 LAB — CBC
HCT: 29.5 % — ABNORMAL LOW (ref 39.0–52.0)
Hemoglobin: 9.1 g/dL — ABNORMAL LOW (ref 13.0–17.0)
MCH: 25.2 pg — ABNORMAL LOW (ref 26.0–34.0)
MCHC: 30.8 g/dL (ref 30.0–36.0)
MCV: 81.7 fL (ref 80.0–100.0)
Platelets: 261 10*3/uL (ref 150–400)
RBC: 3.61 MIL/uL — ABNORMAL LOW (ref 4.22–5.81)
RDW: 17.2 % — ABNORMAL HIGH (ref 11.5–15.5)
WBC: 11.9 10*3/uL — ABNORMAL HIGH (ref 4.0–10.5)
nRBC: 0 % (ref 0.0–0.2)

## 2022-08-21 LAB — BASIC METABOLIC PANEL
Anion gap: 10 (ref 5–15)
BUN: 25 mg/dL — ABNORMAL HIGH (ref 8–23)
CO2: 27 mmol/L (ref 22–32)
Calcium: 8.6 mg/dL — ABNORMAL LOW (ref 8.9–10.3)
Chloride: 101 mmol/L (ref 98–111)
Creatinine, Ser: 1.99 mg/dL — ABNORMAL HIGH (ref 0.61–1.24)
GFR, Estimated: 36 mL/min — ABNORMAL LOW (ref >60)
Glucose, Bld: 98 mg/dL (ref 70–99)
Potassium: 3.5 mmol/L (ref 3.5–5.1)
Sodium: 138 mmol/L (ref 135–145)

## 2022-08-21 LAB — BRAIN NATRIURETIC PEPTIDE: B Natriuretic Peptide: 41.5 pg/mL (ref 0.0–100.0)

## 2022-08-21 MED ORDER — ALPRAZOLAM 0.25 MG PO TABS
0.2500 mg | ORAL_TABLET | Freq: Two times a day (BID) | ORAL | Status: DC | PRN
  Administered 2022-08-21 – 2022-08-28 (×6): 0.25 mg via ORAL
  Filled 2022-08-21 (×7): qty 1

## 2022-08-21 MED ORDER — POTASSIUM CHLORIDE CRYS ER 20 MEQ PO TBCR
40.0000 meq | EXTENDED_RELEASE_TABLET | Freq: Once | ORAL | Status: AC
  Administered 2022-08-21: 40 meq via ORAL
  Filled 2022-08-21: qty 2

## 2022-08-21 NOTE — Progress Notes (Signed)
PROGRESS NOTE    Brendan Butler  OZH:[id number] DOB: Mar 03, 1950 DOA: 08/17/2022 PCP: [name], MD  71/M with history of CAD, multiple PCI and stenting, atrial flutter, depression, hypertension, OSA, CKD 3, bronchiectasis, left nephrectomy presented to the ED with shortness of breath and edema, progressive weight gain and fatigue. -Recently admitted with worsening anemia and positive Hemoccult, transfused PRBC, declined colonoscopy then. -In the ED heart rate 40-50, creatinine 1.9, hemoglobin 7.3, chest x-ray noted mild cardiomegaly. -Admitted, improving with diuretics, blood transfusion -Gastroenterology consulted for iron deficiency anemia, colonoscopy 6/24 noted colon mass, biopsy pending   Subjective: -Feels better, breathing is improving, anxious to go home soon, biopsy pending  Assessment and Plan:  Colon mass Iron deficiency anemia -Recent diagnosis, had heme positive stools on admission few weeks ago, declined GI workup then, now amenable -Holding Xarelto [facility] gastroenterology consulted, EGD/colonoscopy 6/25, colon mass noted, biopsy pending -Transfused 1 unit PRBC, given IV iron -Hemoglobin stable, improving -GI following, will request surgical eval after biopsy results  Acute on chronic diastolic CHF -Last echo 6/24 with EF 50%, regional wall motion abnormalities, normal RV -Worsened in the setting of anemia, troponin is normal -Now improving, 1 status has improved, 5 L negative -Changed to oral Lasix, continue Jardiance  AKI on CKD 3a -Likely hemodynamically mediated, compounded by worsening anemia -Holding Entresto, baseline creatinine around 1.6, now 1.9 -Improving, meds as above  Lung Nodule -Concerning for adeno CA, noted on prior imaging, followed by Dr. Delton Coombes, he missed his appointment for PET scan -This will need to be rescheduled, emphasized with patient regarding importance of this workup  Atrial flutter -With bradycardia, currently on  amiodarone, Xarelto held with colon mass and GI bleeding  CAD HLD > Status post multiple stenting > Does notably have an aspirin allergy - Continue  statin,  - Hold Xarelto as above   Depression - Continue home bupropion, paroxetine, Seroquel   Hypertension -Holding Entresto and Aldactone    OSA - Not on CPAP per chart review   DVT prophylaxis: SCDs Code Status: Full code Family Communication: None present Disposition Plan: Home likely tomorrow pending biopsy results  Consultants: Gastroenterology, cards  Antimicrobials:    Objective: Vitals:   08/20/22 1920 08/21/22 0016 08/21/22 0439 08/21/22 0735  BP: (!) 156/56 (!) 133/53 (!) 126/52 (!) 145/54  Pulse: 64 67 65 66  Resp: 18 18 18 18   Temp: 98.3 F (36.8 C) 98.1 F (36.7 C) 98.4 F (36.9 C) 98.1 F (36.7 C)  TempSrc: Oral Oral Oral Oral  SpO2: 92% 93% 91% 95%  Weight:   92.3 kg   Height:        Intake/Output Summary (Last 24 hours) at 08/21/2022 1135 Last data filed at 08/21/2022 6295 Gross per 24 hour  Intake 360 ml  Output 1505 ml  Net -1145 ml   Filed Weights   08/19/22 0600 08/20/22 0402 08/21/22 0439  Weight: 97.7 kg 93.6 kg 92.3 kg    Examination:  Obese pleasant male sitting up in bed, AAOx3, no distress HEENT: No JVD CVS: S1-S2, regular rhythm Lungs: Decreased breath sounds at the bases otherwise clear Abdomen: Soft, nontender, bowel sounds present Extremities: Trace edema  Skin: No rashes Psychiatry:  Mood & affect appropriate.     Data Reviewed:   CBC: Recent Labs  Lab 08/17/22 1011 08/17/22 1619 08/18/22 0055 08/19/22 0036 08/20/22 0032 08/21/22 0041  WBC 9.5  --  8.8 9.8 10.6* 11.9*  HGB 7.3* 7.8* 9.0* 9.5* 9.2* 9.1*  HCT 24.3*  26.4* 29.7* 30.7* 30.6* 29.5*  MCV 85.6  --  85.3 81.4 82.0 81.7  PLT 223  --  237 267 264 261   Basic Metabolic Panel: Recent Labs  Lab 08/17/22 1011 08/17/22 1017 08/18/22 0055 08/19/22 0036 08/20/22 0032 08/21/22 0041  NA 138  --   140 138 137 138  K 3.8  --  4.4 3.8 3.5 3.5  CL 104  --  104 101 99 101  CO2 25  --  26 26 26 27   GLUCOSE 102*  --  92 92 90 98  BUN 23  --  22 22 24* 25*  CREATININE 1.99*  --  2.02* 1.81* 1.93* 1.99*  CALCIUM 8.3*  --  8.5* 8.7* 8.4* 8.6*  MG  --  2.4  --   --   --   --    GFR: Estimated Creatinine Clearance: 40 mL/min (A) (by C-G formula based on SCr of 1.99 mg/dL (H)). Liver Function Tests: Recent Labs  Lab 08/17/22 1619 08/18/22 0055  AST 16 17  ALT 15 14  ALKPHOS 73 67  BILITOT 0.7 0.7  PROT 6.6 6.4*  ALBUMIN 3.6 3.4*   No results for input(s): "LIPASE", "AMYLASE" in the last 168 hours. No results for input(s): "AMMONIA" in the last 168 hours. Coagulation Profile: No results for input(s): "INR", "PROTIME" in the last 168 hours. Cardiac Enzymes: No results for input(s): "CKTOTAL", "CKMB", "CKMBINDEX", "TROPONINI" in the last 168 hours. BNP (last 3 results) No results for input(s): "PROBNP" in the last 8760 hours. HbA1C: No results for input(s): "HGBA1C" in the last 72 hours. CBG: No results for input(s): "GLUCAP" in the last 168 hours. Lipid Profile: No results for input(s): "CHOL", "HDL", "LDLCALC", "TRIG", "CHOLHDL", "LDLDIRECT" in the last 72 hours. Thyroid Function Tests: No results for input(s): "TSH", "T4TOTAL", "FREET4", "T3FREE", "THYROIDAB" in the last 72 hours.  Anemia Panel: No results for input(s): "VITAMINB12", "FOLATE", "FERRITIN", "TIBC", "IRON", "RETICCTPCT" in the last 72 hours.  Urine analysis:    Component Value Date/Time   COLORURINE AMBER (A) 05/20/2022 2038   APPEARANCEUR CLEAR 05/20/2022 2038   LABSPEC 1.034 (H) 05/20/2022 2038   PHURINE 5.0 05/20/2022 2038   GLUCOSEU NEGATIVE 05/20/2022 2038   HGBUR NEGATIVE 05/20/2022 2038   BILIRUBINUR NEGATIVE 05/20/2022 2038   KETONESUR NEGATIVE 05/20/2022 2038   PROTEINUR NEGATIVE 05/20/2022 2038   NITRITE NEGATIVE 05/20/2022 2038   LEUKOCYTESUR NEGATIVE 05/20/2022 2038   Sepsis  Labs: @LABRCNTIP (procalcitonin:4,lacticidven:4)  )No results found for this or any previous visit (from the past 240 hour(s)).   Radiology Studies: No results found.   Scheduled Meds:  sodium chloride   Intravenous Once   amiodarone  100 mg Oral Daily   buPROPion  150 mg Oral Daily   empagliflozin  10 mg Oral Daily   furosemide  40 mg Oral BID   pantoprazole  40 mg Oral Daily   PARoxetine  30 mg Oral Daily   pregabalin  150 mg Oral TID   QUEtiapine  25 mg Oral QHS   rosuvastatin  20 mg Oral Daily   sodium chloride flush  3 mL Intravenous Q12H   Continuous Infusions:  sodium chloride 10 mL/hr at 08/20/22 0136   ferric gluconate (FERRLECIT) IVPB 250 mg (08/21/22 1020)   promethazine (PHENERGAN) injection (IM or IVPB) 12.5 mg (08/19/22 1654)     LOS: 3 days    Time spent:    Zannie Cove, MD [facility]   08/21/2022, 11:35 AM

## 2022-08-21 NOTE — Care Management Important Message (Signed)
Important Message  Patient Details  Name: Brendan Butler MRN: [id number] Date of Birth: 1951/01/02   Medicare Important Message Given:  Yes     Renie Ora 08/21/2022, 11:32 AM

## 2022-08-21 NOTE — Progress Notes (Signed)
Heart Failure Stewardship Pharmacist Progress Note   PCP: [name], MD PCP-Cardiologist: Charlton Haws, MD    HPI:  72 yo M with PMH of chronic HFrEF with EF as low as 35-40% in 12/2012 with normalization to 55-60% in 11/2021 but then down again to 20-25% on TEE in 04/2022, CAD, GIB, bradycardia, aflutter, HTN, HLD, OSA, CKD IIIa, and prior left nephrectomy.  Admitted in 12/2012 with STEMI s/p PCI to mild LAD. Readmitted 1 week later with in-stent restenosis complicated by VF on cath table, thought to be due to Brilinta failure vs noncompliance. LVEF was 35-40%.   Admitted in 04/2022 with altered mental status. Noncompliance to medications. Found to have atrial flutter and decline of LVEF. Underwent successful TEE/DCCV on 3/14. He left AMA.   Readmitted later that month with aflutter RVR, AMS, fall, and rib fractures. He was started on IV amiodarone and metoprolol. Amiodarone was later stopped due to concern for compliance. He was noted to have some pauses on telemetry so a live Zio monitor was placed and plan was for discharge to SNF on 05/24/2022; however, while waiting for transportation, he was noted to have a 4 second pause followed by a 5.8 second pause. Discharge was cancelled and BB was stopped. EP was consulted and he was restarted on IV amiodarone and then underwent repeat TEE/DCCV on 05/28/2022 with restoration of sinus rhythm. EP recommended avoiding permament pacemaker if at all possible given compliance issues.   Admitted 07/2022 with acute blood loss anemia requiring PRBC. GI recommended colonoscopy but patient declined, treated with IV iron. He did not follow up as outpatient.   Presented to the ED on 6/21 with shortness of breath, fatigue, weight gain, and LE edema despite taking increased lasix at home after speaking with outpatient cardiology on 6/18. CXR with mild cardiac enlargement. ECHO 6/22 showed LVEF 50%, regional wall motion abnormalities, RV normal.   Current HF  Medications: Diuretic: furosemide 40 mg PO BID SGLT2i: Jardiance 10 mg daily Other: Ferrlecit 250 mg IV daily  Prior to admission HF Medications: Diuretic: furosemide 40 mg daily ACE/ARB/ARNI: Entresto 24/26 mg BID MRA: spironolactone 12.5 mg daily SGLT2i: Jardiance 10 mg daily  Pertinent Lab Values: Serum creatinine 1.99, BUN 25, Potassium 3.5, Sodium 138, BNP 26.9, Magnesium 2.4 TSAT 3, Ferritin 17  Vital Signs: Weight: 203 lbs (admission weight: 216 lbs) Blood pressure: 100/50s  Heart rate: 60s  I/O: -0.5L yesterday; net -5.7L  Medication Assistance / Insurance Benefits Check: Does the patient have prescription insurance?  Yes Type of insurance plan: [employer]  Outpatient Pharmacy:  [facility] Is the patient willing to use [facility] TOC pharmacy at discharge? Yes Is the patient willing to transition their outpatient pharmacy to utilize a [facility] outpatient pharmacy?   No    Assessment: 1. Acute on chronic HFimpEF (LVEF 50%), due to ICM. NYHA class II symptoms. - Continue furosemide 40 mg PO BID. Strict I/Os and daily weights. Keep K>4 and Mg>2. Consider replacing with KCl 40 mEq x 1 - No BB with bradycardia - Consider restarting Entresto 24/26 mg BID or spironolactone prior to discharge vs as outpatient pending renal function improvement. Baseline creatinine ~1.6 - Continue Jardiance 10 mg daily - Iron stores low. Continue Ferrlecit 250 mg IV daily.   Plan: 1) Medication changes recommended at this time: - KCl 40 mEq x 1 - Restart Entresto 24/26 mg BID or spironolactone prior to discharge vs as outpatient if creatinine improves  2) Patient assistance: - None pending - patient states all copays are affordable for him. His daughter helps with medications at home and sets them up in pill packs for him.  3)  Education  - Patient has been educated on current HF medications and potential additions to HF medication regimen -  Patient verbalizes understanding that over the next few months, these medication doses may change and more medications may be added to optimize HF regimen - Patient has been educated on basic disease state pathophysiology and goals of therapy   Sharen Hones, PharmD, BCPS Heart Failure Stewardship Pharmacist Phone [phone]

## 2022-08-21 NOTE — Progress Notes (Signed)
Physical Therapy Treatment Patient Details Name: Brendan Butler MRN: [id number] DOB: 05/29/1950 Today's Date: 08/21/2022   History of Present Illness Brendan Butler is a 72 y.o. male presenting with shortness of breath and edema. PMH:CAD status post multiple stenting, symptomatic bradycardia, atrial flutter, depression, hypertension, hyperlipidemia, OSA, CKD 3A, bronchiectasis, CHF, status post left nephrectomy, ASA allergy    PT Comments    Pt received in supine and agreeable to session. Pt reporting spilling water in bed and needing new linens and a gown. Pt able to perform bed mobility without assist or increased effort. Pt standing from EOB with supervision due to slight unsteadiness, but pt is able to change gown while standing without LOB. Pt demonstrating one slight LOB while turning, but pt reaches out to sink and is able to maintain balance without assist. Pt is motivated to increase gait distance this session and demonstrates improved stability without AD, however continues to demonstrate some unsteadiness and drifting with pt relating this to neuropathy. Pt's SpO2 remains at 92-93% on RA throughout ambulation. Pt reports feeling comfortable with navigating stairs after previous session. Pt continues to benefit from PT services to progress toward functional mobility goals.     Recommendations for follow up therapy are one component of a multi-disciplinary discharge planning process, led by the attending physician.  Recommendations may be updated based on patient status, additional functional criteria and insurance authorization.     Assistance Recommended at Discharge Intermittent Supervision/Assistance  Patient can return home with the following     Equipment Recommendations  None recommended by PT    Recommendations for Other Services       Precautions / Restrictions Precautions Precautions: Fall Precaution Comments: watch O2 Restrictions Weight Bearing Restrictions: No      Mobility  Bed Mobility Overal bed mobility: Independent                  Transfers Overall transfer level: Needs assistance Equipment used: None Transfers: Sit to/from Stand Sit to Stand: Supervision           General transfer comment: from low EOB with supervision for safety due to slight unsteadiness    Ambulation/Gait Ambulation/Gait assistance: Supervision Gait Distance (Feet): 940 Feet Assistive device: None Gait Pattern/deviations: Step-through pattern, Drifts right/left       General Gait Details: Pt demonstrating good pacing. slight unsteadiness and intermittent drifting, but no overt LOB. Pt reports neuropathy that causes some impaired balance.      Balance Overall balance assessment: Mild deficits observed, not formally tested                                          Cognition Arousal/Alertness: Awake/alert Behavior During Therapy: WFL for tasks assessed/performed Overall Cognitive Status: Within Functional Limits for tasks assessed                                          Exercises      General Comments General comments (skin integrity, edema, etc.): Pt reporting no DOE and SpO2 92-93% and HR in the 80's throughout ambulation      Pertinent Vitals/Pain Pain Assessment Pain Assessment: No/denies pain     PT Goals (current goals can now be found in the care plan section) Acute Rehab PT Goals Patient Stated  Goal: home PT Goal Formulation: With patient Time For Goal Achievement: 09/01/22 Potential to Achieve Goals: Good Progress towards PT goals: Progressing toward goals    Frequency    Min 1X/week      PT Plan Current plan remains appropriate       AM-PAC PT "6 Clicks" Mobility   Outcome Measure  Help needed turning from your back to your side while in a flat bed without using bedrails?: None Help needed moving from lying on your back to sitting on the side of a flat bed without using  bedrails?: None Help needed moving to and from a bed to a chair (including a wheelchair)?: None Help needed standing up from a chair using your arms (e.g., wheelchair or bedside chair)?: A Little Help needed to walk in hospital room?: A Little Help needed climbing 3-5 steps with a railing? : A Little 6 Click Score: 21    End of Session   Activity Tolerance: Patient tolerated treatment well Patient left: in chair;with call bell/phone within reach Nurse Communication: Mobility status PT Visit Diagnosis: Unsteadiness on feet (R26.81);Muscle weakness (generalized) (M62.81);Difficulty in walking, not elsewhere classified (R26.2)     Time: 2536-6440 PT Time Calculation (min) (ACUTE ONLY): 19 min  Charges:  $Gait Training: 8-22 mins                     Johny Shock, PTA Acute Rehabilitation Services Secure Chat Preferred  [phone]    Johny Shock 08/21/2022, 10:24 AM

## 2022-08-21 NOTE — Progress Notes (Addendum)
Progress Note  Patient Name: Brendan Butler Date of Encounter: 08/21/2022 Primary Cardiologist: Charlton Haws, MD   Subjective   No events overnight. Patient notes that he is likely going to leave tomorrow or soon after. His team has been asking him to get a colonoscopy for years and he did not want to because he felt they would find something.  Is contemplating just living his life with this. He quit smoking and drinking in 2001; he now just uses occasional smoked marijuana.  Vital Signs    Vitals:   08/20/22 1920 08/21/22 0016 08/21/22 0439 08/21/22 0735  BP: (!) 156/56 (!) 133/53 (!) 126/52 (!) 145/54  Pulse: 64 67 65 66  Resp: 18 18 18 18   Temp: 98.3 F (36.8 C) 98.1 F (36.7 C) 98.4 F (36.9 C) 98.1 F (36.7 C)  TempSrc: Oral Oral Oral Oral  SpO2: 92% 93% 91% 95%  Weight:   92.3 kg   Height:        Intake/Output Summary (Last 24 hours) at 08/21/2022 0810 Last data filed at 08/21/2022 0737 Gross per 24 hour  Intake 860 ml  Output 1505 ml  Net -645 ml   Filed Weights   08/19/22 0600 08/20/22 0402 08/21/22 0439  Weight: 97.7 kg 93.6 kg 92.3 kg    Physical Exam   GEN: No acute distress.   HEENT: No JVD, poor dentition Cardiac: no murmur, no rubs, or gallops.  Respiratory: Clear to auscultation bilaterally. GI: Soft, nontender, non-distended  MS: No edema  Labs   Telemetry: SR   Chemistry Recent Labs  Lab 08/17/22 1619 08/18/22 0055 08/19/22 0036 08/20/22 0032 08/21/22 0041  NA  --  140 138 137 138  K  --  4.4 3.8 3.5 3.5  CL  --  104 101 99 101  CO2  --  26 26 26 27   GLUCOSE  --  92 92 90 98  BUN  --  22 22 24* 25*  CREATININE  --  2.02* 1.81* 1.93* 1.99*  CALCIUM  --  8.5* 8.7* 8.4* 8.6*  PROT 6.6 6.4*  --   --   --   ALBUMIN 3.6 3.4*  --   --   --   AST 16 17  --   --   --   ALT 15 14  --   --   --   ALKPHOS 73 67  --   --   --   BILITOT 0.7 0.7  --   --   --   GFRNONAA  --  35* 40* 37* 36*  ANIONGAP  --  10 11 12 10       Hematology Recent Labs  Lab 08/19/22 0036 08/20/22 0032 08/21/22 0041  WBC 9.8 10.6* 11.9*  RBC 3.77* 3.73* 3.61*  HGB 9.5* 9.2* 9.1*  HCT 30.7* 30.6* 29.5*  MCV 81.4 82.0 81.7  MCH 25.2* 24.7* 25.2*  MCHC 30.9 30.1 30.8  RDW 17.7* 17.2* 17.2*  PLT 267 264 261    Cardiac EnzymesNo results for input(s): "TROPONINI" in the last 168 hours. No results for input(s): "TROPIPOC" in the last 168 hours.   BNP Recent Labs  Lab 08/17/22 1013 08/21/22 0041  BNP 26.9 41.5     DDimer  Recent Labs  Lab 08/17/22 1017  DDIMER 0.30     Cardiac Studies   Cardiac Studies & Procedures     STRESS TESTS  NM MYOCAR MULTI W/SPECT W 01/13/2013   ECHOCARDIOGRAM  ECHOCARDIOGRAM LIMITED 08/18/2022  Narrative ECHOCARDIOGRAM REPORT    Patient Name:   Brendan Butler Date of Exam: 08/17/2022 Medical Rec #:  [id number]        Height:       70.0 in Accession #:    [id number]       Weight:       216.5 lb Date of Birth:  [birth year]       BSA:          2.159 m Patient Age:    71 years         BP:           147/53 mmHg Patient Gender: M                HR:           46 bpm. Exam Location:  Inpatient  Procedure: Limited Echo, Color Doppler and Cardiac Doppler  Indications:    dyspnea  History:        Patient has prior history of Echocardiogram examinations. CHF, CAD, chronic kidney disease; Risk Factors:Hypertension, Dyslipidemia and Sleep Apnea.  Sonographer:    Delcie Roch RDCS Referring Phys: [id number] Cecille Po MELVIN  IMPRESSIONS   1. Left ventricular ejection fraction, by estimation, is 50%. The left ventricle has low normal function. The left ventricle demonstrates regional wall motion abnormalities (see scoring diagram/findings for description). Left ventricular diastolic parameters were grossly normal. 2. Right ventricular systolic function is normal. The right ventricular size is normal. There is normal pulmonary artery systolic pressure. The estimated right  ventricular systolic pressure is 20.8 mmHg. 3. The mitral valve is grossly normal. Trivial mitral valve regurgitation. 4. The aortic valve is grossly normal. Aortic valve regurgitation is trivial. No aortic stenosis is present. 5. The inferior vena cava is normal in size with greater than 50% respiratory variability, suggesting right atrial pressure of 3 mmHg.  FINDINGS Left Ventricle: Left ventricular ejection fraction, by estimation, is 50%. The left ventricle has low normal function. The left ventricle demonstrates regional wall motion abnormalities. The left ventricular internal cavity size was normal in size. There is no left ventricular hypertrophy. Left ventricular diastolic parameters were normal.   LV Wall Scoring: The mid anteroseptal segment, mid inferoseptal segment, and basal inferoseptal segment are hypokinetic.  Right Ventricle: The right ventricular size is normal. No increase in right ventricular wall thickness. Right ventricular systolic function is normal. There is normal pulmonary artery systolic pressure. The tricuspid regurgitant velocity is 2.11 m/s, and with an assumed right atrial pressure of 3 mmHg, the estimated right ventricular systolic pressure is 20.8 mmHg.  Left Atrium: Left atrial size was normal in size.  Right Atrium: Right atrial size was normal in size.  Pericardium: There is no evidence of pericardial effusion.  Mitral Valve: The mitral valve is grossly normal. Trivial mitral valve regurgitation.  Tricuspid Valve: The tricuspid valve is grossly normal. Tricuspid valve regurgitation is trivial.  Aortic Valve: The aortic valve is grossly normal. Aortic valve regurgitation is trivial. No aortic stenosis is present.  Pulmonic Valve: The pulmonic valve was grossly normal. Pulmonic valve regurgitation is trivial.  Aorta: The aortic root is normal in size and structure.  Venous: The inferior vena cava is normal in size with greater than 50% respiratory  variability, suggesting right atrial pressure of 3 mmHg.  IAS/Shunts: The interatrial septum was not assessed.   LEFT VENTRICLE PLAX 2D LVIDd:         6.10 cm      Diastology LVIDs:  4.30 cm      LV e' medial:    7.18 cm/s LV PW:         1.00 cm      LV E/e' medial:  12.3 LV IVS:        0.90 cm      LV e' lateral:   12.50 cm/s LVOT diam:     2.20 cm      LV E/e' lateral: 7.0 LV SV:         97 LV SV Index:   45 LVOT Area:     3.80 cm  LV Volumes (MOD) LV vol d, MOD A4C: 118.0 ml LV vol s, MOD A4C: 66.5 ml LV SV MOD A4C:     118.0 ml  IVC IVC diam: 1.20 cm  LEFT ATRIUM         Index LA diam:    4.60 cm 2.13 cm/m AORTIC VALVE LVOT Vmax:   104.00 cm/s LVOT Vmean:  66.600 cm/s LVOT VTI:    0.254 m  AORTA Ao Asc diam: 3.60 cm  MITRAL VALVE               TRICUSPID VALVE MV Area (PHT): 3.20 cm    TR Peak grad:   17.8 mmHg MV Decel Time: 237 msec    TR Vmax:        211.00 cm/s MV E velocity: 88.00 cm/s MV A velocity: 88.00 cm/s  SHUNTS MV E/A ratio:  1.00        Systemic VTI:  0.25 m Systemic Diam: 2.20 cm  Weston Brass MD Electronically signed by Weston Brass MD Signature Date/Time: 08/18/2022/6:33:02 AM    Final   TEE  ECHO TEE 05/28/2022  Narrative TRANSESOPHOGEAL ECHO REPORT    Patient Name:   Brendan Butler Date of Exam: 05/28/2022 Medical Rec #:  [id number]        Height:       70.0 in Accession #:    [id number]       Weight:       198.5 lb Date of Birth:  14-Nov-1950       BSA:          2.081 m Patient Age:    71 years         BP:           112/68 mmHg Patient Gender: M                HR:           99 bpm. Exam Location:  Inpatient  Procedure: Transesophageal Echo and Color Doppler  Indications:     Atrial fibrillation requiring DCCV  History:         Patient has prior history of Echocardiogram examinations, most recent 05/10/2022. CAD, Arrythmias:Atrial Fibrillation; Risk Factors:Hypertension, Dyslipidemia and Former  Smoker.  Sonographer:     Milbert Coulter Referring Phys:  [id number] Wendall Stade Diagnosing Phys: Charlton Haws MD  PROCEDURE: The transesophogeal probe was passed without difficulty through the esophogus of the patient. Sedation performed by different physician. The patient was monitored while under deep sedation. Anesthestetic sedation was provided intravenously by Anesthesiology: 100mg  of Propofol. Image quality was good. The patient's vital signs; including heart rate, blood pressure, and oxygen saturation; remained stable throughout the procedure. The patient developed no complications during the procedure. A successful direct current cardioversion was performed at 150 joules with 1 attempt.  IMPRESSIONS   1. No LAA thrombus DCC x 1  with 150 J biphasic converted to NSR. 2. Left ventricular ejection fraction, by estimation, is 25 to 30%. The left ventricle has severely decreased function. The left ventricle demonstrates global hypokinesis. The left ventricular internal cavity size was moderately dilated. 3. Right ventricular systolic function is normal. The right ventricular size is normal. 4. Left atrial size was moderately dilated. No left atrial/left atrial appendage thrombus was detected. 5. The mitral valve is normal in structure. Mild mitral valve regurgitation. No evidence of mitral stenosis. 6. The aortic valve is tricuspid. Aortic valve regurgitation is not visualized. No aortic stenosis is present. 7. The inferior vena cava is normal in size with greater than 50% respiratory variability, suggesting right atrial pressure of 3 mmHg.  Conclusion(s)/Recommendation(s): Normal biventricular function without evidence of hemodynamically significant valvular heart disease.  FINDINGS Left Ventricle: Left ventricular ejection fraction, by estimation, is 25 to 30%. The left ventricle has severely decreased function. The left ventricle demonstrates global hypokinesis. The left ventricular internal  cavity size was moderately dilated. There is no left ventricular hypertrophy.  Right Ventricle: The right ventricular size is normal. No increase in right ventricular wall thickness. Right ventricular systolic function is normal.  Left Atrium: Left atrial size was moderately dilated. No left atrial/left atrial appendage thrombus was detected.  Right Atrium: Right atrial size was normal in size.  Pericardium: There is no evidence of pericardial effusion.  Mitral Valve: The mitral valve is normal in structure. Mild mitral valve regurgitation. No evidence of mitral valve stenosis.  Tricuspid Valve: The tricuspid valve is normal in structure. Tricuspid valve regurgitation is not demonstrated. No evidence of tricuspid stenosis.  Aortic Valve: The aortic valve is tricuspid. Aortic valve regurgitation is not visualized. No aortic stenosis is present.  Pulmonic Valve: The pulmonic valve was normal in structure. Pulmonic valve regurgitation is not visualized. No evidence of pulmonic stenosis.  Aorta: The aortic root is normal in size and structure.  Venous: The inferior vena cava is normal in size with greater than 50% respiratory variability, suggesting right atrial pressure of 3 mmHg.  IAS/Shunts: No atrial level shunt detected by color flow Doppler.  Additional Comments: No LAA thrombus DCC x 1 with 150 J biphasic converted to NSR.  Charlton Haws MD Electronically signed by Charlton Haws MD Signature Date/Time: 05/28/2022/10:13:00 AM    Final   MONITORS  LONG TERM MONITOR-LIVE TELEMETRY (3-14 DAYS) 06/15/2022  Narrative HR 41 - 188 bpm, average 74 bpm. 88 nonsustained SVT, longest 15.5 seconds. 35% burden of atrial flutter, average rate 76 bpm. 2 pauses, longest 5.8 seconds. Frequent supraventricular ectopy, 7.9%. Rare ventricular ectopy.  Sheria Lang T. Lalla Brothers, MD, [facility], [facility] Cardiac Electrophysiology           Assessment & Plan    Acute on Chronic HFrEF; recovered with  rhythm control strategy - Complicated by hypoalbuminemia and AKI on CKD - Diuretics: 40 PO BID - Jardiance 10 mg   - Creatinine is still above baseline - at this point, will not return Entresto 24/26 and MRA 12.5 mg PO Daily until either outpatient (ARNI favored over MRA with transition to lasix 40 mg PO Daily) unless this is new baseline creatinine or until his creatinine normalizes   PAF and AFL Complicated by sinus bradycardia with 1st HB - not on BB due to h/o bradycardia - on amiodarone 100mg  daily; seen by EP - DOAC when cleared by GI  CAD s/p DES to LAD with questionable medication adherence of DAPT - - asymptomatic - prior stent thrombosis  issue felt potentially due to issue of med nonadherence - planned for DOAC as above   AKI on CKD 3b  - above baseline of ~ 1.6; diuretics as above   Lung nodule Colon mass - pending pathology; care as per primary, goal of conservative management of the above given potential malignancy   OSA -CPAP is recommended he is not amenable (see 08/17/22 note)  Will chart check 08/22/22 if he is still here; at that point either continue current therapy or add back ARNI/MRA as above if creatinine returns to baseline Working on outpatient follow up Addendum: Creatinine is still elevated: has 09/03/22 visit.  Will recheck creatinine at that visit, if Creatinine is stable, this may be new baseline.  Will attempt ARNI restart at that time. Discussed with [facility] Colleagues: he is high risk for surgery but he has been well optimized through this admission.  Surgery is reasonable    For questions or updates, please contact [facility] Please consult [email or web address] for contact info under Cardiology/STEMI.      Riley Lam, MD FASE [facility] Cardiologist [facility]  [address] [place], [address] [place], [place] [address] [phone]  8:10 AM

## 2022-08-21 NOTE — Progress Notes (Signed)
Mobility Specialist Progress Note:   08/21/22 1400  Mobility  Activity Ambulated independently in hallway  Level of Assistance Independent  Assistive Device None  Distance Ambulated (ft) 1000 ft  Activity Response Tolerated well  Mobility Referral Yes  $Mobility charge 1 Mobility  Mobility Specialist Start Time (ACUTE ONLY) 1340  Mobility Specialist Stop Time (ACUTE ONLY) 1355  Mobility Specialist Time Calculation (min) (ACUTE ONLY) 15 min    Pt received in chair, agreeable to mobility. Ambulated independently in hallway under SV. Experienced LOB x1 but self corrected, otherwise asymptomatic. Pt left in chair with call bell and RN present.  [place]ante Earlene Plater Mobility Specialist Please contact via Special educational needs teacher or Rehab office at [phone]

## 2022-08-21 NOTE — Progress Notes (Addendum)
Cardiology Office Note:    Date:  09/03/2022   ID:  [name], DOB 11/05/1950, MRN [id number]  PCP:  [name], MD  Cardiologist:  Charlton Haws, MD  Electrophysiologist:  None   Referring MD: [name], MD   Chief Complaint: follow-up of CAD, CHF, and atrial flutter  History of Present Illness:    Brendan Butler is a 72 y.o. male with a history of CAD s/p multiple PCIs to LAD (most recently in 2015), chronic HFrEF with EF as low as 20-25% on TEE in 04/2022 but improved to 50% in 07/2022, persistent atrial flutter s/p DCCV in 04/2022 and again in 05/2022 on Xarelto, sinus pauses, hypertension, hyperlipidemia, s/p left nephrectomy with CKD stage III, obstructive sleep apnea, pulmonary nodules, GI bleeding requiring blood transfusions secondary to recently diagnosed colon cancer s/p right hemicolectomy on 08/23/2022, and anxiety/depression who is followed by Dr. Eden Emms and presents today for routine follow-up.   Patient has a long history of CAD with multiple interventions to LAD dating back to 2001. He was admitted in 12/2012 for anterior STEMI and underwent underwent PCI with DES to LAD. He presented back to the hospital one week later for another anterior STEMI complicated by ventricular fibrillation on the cath lab table requiring defibrillation. He was found to have acute in-stent thrombosis of recently placed LAD stent. Intravascular ultrasound was then performed in order to define the mechanism of stent thrombosis. There previously implanted stent appeared well expanded throughout but there was an area of mal apposition in the distal stent. He underwent successful PCI with balloon angioplasty of the entire stented segment with 0% residual stenosis. Echo at that time showed LVEF of 40-45% with hypokinesis sof the anterior, anteroseptal, and apical segments consistent with LAD territory infarct. He was initially treated with IV Amiodarone given ventricular fibrillation but had no  recurrence after revascularization so this was ultimately stopped. He was switched from [place] to Effient at that time. Repeat LHC in 03/2013 for recurrent chest pain showed 70% in-stent restenosis of prior LAD and underwent repeat balloon angioplasty. He was lost to follow-up in our office after 2015 except for being seen in consult in the hospital in 2017 for chest pain (felt to be atypical) and in 11/2021 for pre-op evaluation for lap chole in setting of acute cholecystitis and multiple episodes of near syncope/ syncope with bradycardia noted in the ED. Echo at that time showed normalization of EF to 55-60% with hypokinesis of the entire anterior septum and grade 1 diastolic dysfunction. He was felt to be at acceptable risk for surgery and had no evidence of high grade AV block during admission. He was again lost to follow-up after this admission and not seen again until his most recent hospitalization.   He was admitted on 05/06/2022 for altered mental status and delusional thoughts and was incidentally found to be in new onset rapid atrial flutter. He was started on IV Cardizem and Eliquis and then underwent successful TEE/ DCCV on 05/10/2022 with return of sinus rhythm. TEE showed newly reduced EF of 20-25% with global hypokinesis.  He left AMA later that day before medications could be optimized. However, Effient was switched to Plavix given need for Eliquis. He was readmitted on 05/20/2022 for worsening altered mental status ad found to be back in atrial flutter with RVR. He had not been compliant with his anticoagulation. He was switched from Eliquis to Xarelto to help encourage compliance and was started on IV Amiodarone and Metoprolol. Amiodarone  was discontinued due to concern about long-term use of this in the setting of non-compliance. He remained rate controlled without the Amiodarone so the initial plan was to wait on repeat DCCV until he could prove compliance with anticoagulation as an outpatient.  GDMT was adjusted.  He was noted to have some pauses on telemetry so a live Zio monitor was placed and plan was for discharge to SNF on 05/24/2022; however, while waiting for transportation to SNF, he was noted to have a 4 second pause followed by a 5.8 second pause.  Therefore, his discharge was cancelled and his beta-blocker was stopped. EP was consulted and he was restarted on IV Amiodarone and then underwent repeat TEE/DCCV on 05/28/2022 with restoration of sinus rhythm. EP recommended avoiding permament pacemaker if at all possible given compliance issues. He was switched to PO Amiodarone and no recurrent pauses were noted. Of note, Plavix was also stopped this admission.    He was last seen by me in 05/2022 at which time he was maintaining sinus rhythm and his mental status had improved. However, he was having some lower extremity edema. His PCP had prescribed Lasix 20mg  but he had no improvement with this so he increased the dose himself to 40mg  every other day. Weight weight was also up about 13 lbs from discharge. BNP was normal and D-dimer was negative when adjusted for age. Lasix was increased to 40mg  daily. Repeat Echo was also oredered to reassess LV function given he was maintaining sinus rhythm.   He was recently admitted from 07/30/2022 to 07/31/2022 for acute blood loss anemia after outpatient labs showed hemoglobin of 6.7. Hemoccult was positive. He was transfused 1 unit of PRBCs GI was consulted and recommended a colonoscopy but patient declined. Hemoglobin improved to 7.7 prior to discharge and GI gave the OK for him to continue Xarelto with close follow-up. Iron levels were low so he was given one dose of IV iron and then prescribed PO iron at discharge. He continued to have lower extremity edema. BNP was again normal. Lower extremity dopplers were negative for DVT. He was given one dose of IV Lasix after his blood transfusion and then put back on home Lasix.   Patient was then admitted again from  08/17/2022 to 08/28/2022 after presenting with multiple complaints including worsening dyspnea, worsening lower extremity edema (right > left), dizziness, general malaise, and low BP.  High-sensitivity troponin and D-dimer were negative. BNP was normal. Chest x-ray showed mild cardiac enlargement but no overt edema. Venous dopplers were negative for DVT in the right lower extremity. ABIs were normal. Echo showed LVEF of 50% with hypokinesis of the mid anteroseptal segment, mid inferoseptal segment, and basal inferoseptal segment. Admission labs were also significant for AKI with creatinine of 1.99 (baseline 1.6).  He was ultimately admitted for symptomatic anemia and acute on chronic CHF. He was noted to be in sinus bradycardia on arrival with rates in the 40s to 50s and there was initial concern if this was playing a role in his symptoms. Amiodarone was decreased to 100mg  daily but symptoms were felt to primarily be due to her anemia. Xarelto was held and he was transfused 1 unit of PRBCs.Marland Kitchen He was started on IV Lasix and then transitioned to PO. GI was consulted and he underwent EGD/ colonoscopy on 08/20/2022 which showed a likely malignant tumor at the hepatic flexure. General Surgery was consulted and he underwent a right hemicolectomy with ileocolonic anastomosis on 08/23/2022. Xarelto was able to be restarted  prior to discharge. He was net negative 5.5 L during admission and discharge weight was 202 lbs.  Patient presents today for follow-up. Here with daughter [name]. Overall, patient doing okay from a cardiac standpoint. He states his breathing has improved since hospitalization. He does describe some dyspnea on exertion but he is still in a quite a bit of pain from his abdominal surgery so suspect this is contributing. No orthopnea or PND since discharge. He still has some lower extremity edema (right chronically > right) but improved from where it was prior to hospitalizations. He endorses a high sodium diet  - he is eating a lot of canned foods as well as [employer]. No chest pain, palpitations, lightheadedness, dizziness, or syncope. He denies any abnormal bleeding in urine or stools.  Of note, daughters states they have not heard back about final pathology report; however, per report in Epic, biopsy was positive for invasive moderately differentiated adenocarcinoma that invades into pericolonic soft tissue. Resection margins were negative for carcinoma and negative for lymphovascular or perineural invasion. Daughter was able to see this on MyChart. They have not heard from Oncology. Recommended following back up with General Surgery about this at follow-up visit with them.    EKGs/Labs/Other Studies Reviewed:    The following studies were reviewed:  TTE 12/26/2021: Impressions: 1. Left ventricular ejection fraction, by estimation, is 55 to 60%. The  left ventricle has normal function. The left ventricle demonstrates  regional wall motion abnormalities (suggestive of prior anteroseptal/LAD  infarct). The left ventricular internal  cavity size was mildly dilated. Left ventricular diastolic parameters are  consistent with Grade I diastolic dysfunction (impaired relaxation).   2. Right ventricular systolic function is normal. The right ventricular  size is normal. Tricuspid regurgitation signal is inadequate for assessing  PA pressure.   3. The mitral valve is grossly normal. No evidence of mitral valve  regurgitation. No evidence of mitral stenosis.   4. The aortic valve is tricuspid. Aortic valve regurgitation is not  visualized. No aortic stenosis is present.   Comparison(s): LVEF has improve from prior reporting.  _______________   TEE 05/10/2022: Impressions: 1. Successful TEE/DCCV with return to NSR. No LAA thrombus. LVEF 20-25%.  Global HK.   2. Left ventricular ejection fraction, by estimation, is 20 to 25%. The  left ventricle has severely decreased function.   3. Right  ventricular systolic function is mildly reduced. The right  ventricular size is normal.   4. Left atrial size was moderately dilated. No left atrial/left atrial  appendage thrombus was detected. The LAA emptying velocity was 39 cm/s.   5. Right atrial size was mildly dilated.   6. The mitral valve is grossly normal. Mild mitral valve regurgitation.  No evidence of mitral stenosis.   7. The aortic valve is tricuspid. Aortic valve regurgitation is not  visualized. No aortic stenosis is present.   8. There is Moderate (Grade III) layered plaque involving the descending  aorta.    _______________   TEE 05/28/2022: Impressions: 1. No LAA thrombus DCC x 1 with 150 J biphasic converted to NSR.   2. Left ventricular ejection fraction, by estimation, is 25 to 30%. The  left ventricle has severely decreased function. The left ventricle  demonstrates global hypokinesis. The left ventricular internal cavity size  was moderately dilated.   3. Right ventricular systolic function is normal. The right ventricular  size is normal.   4. Left atrial size was moderately dilated. No left atrial/left atrial  appendage  thrombus was detected.   5. The mitral valve is normal in structure. Mild mitral valve  regurgitation. No evidence of mitral stenosis.   6. The aortic valve is tricuspid. Aortic valve regurgitation is not  visualized. No aortic stenosis is present.   7. The inferior vena cava is normal in size with greater than 50%  respiratory variability, suggesting right atrial pressure of 3 mmHg.   Conclusion(s)/Recommendation(s): Normal biventricular function without  evidence of hemodynamically significant valvular heart disease.  _______________  TTE 08/17/2022: Impressions: 1. Left ventricular ejection fraction, by estimation, is 50%. The left  ventricle has low normal function. The left ventricle demonstrates  regional wall motion abnormalities (see scoring diagram/findings for  description).  Left ventricular diastolic  parameters were grossly normal.   2. Right ventricular systolic function is normal. The right ventricular  size is normal. There is normal pulmonary artery systolic pressure. The  estimated right ventricular systolic pressure is 20.8 mmHg.   3. The mitral valve is grossly normal. Trivial mitral valve  regurgitation.   4. The aortic valve is grossly normal. Aortic valve regurgitation is  trivial. No aortic stenosis is present.   5. The inferior vena cava is normal in size with greater than 50%  respiratory variability, suggesting right atrial pressure of 3 mmHg.   EKG:  EKG not ordered today.  Recent Labs: 08/17/2022: TSH 8.374 08/18/2022: ALT 14 08/21/2022: B Natriuretic Peptide 41.5 08/27/2022: Hemoglobin 8.5; Platelets 195 08/28/2022: BUN 25; Creatinine, Ser 1.76; Magnesium 2.4; Potassium 3.9; Sodium 139  Recent Lipid Panel    Component Value Date/Time   CHOL 139 05/24/2022 0051   TRIG 182 (H) 05/24/2022 0051   HDL 27 (L) 05/24/2022 0051   CHOLHDL 5.1 05/24/2022 0051   VLDL 36 05/24/2022 0051   LDLCALC 76 05/24/2022 0051    Physical Exam:    Vital Signs: BP (!) 138/52 (BP Location: Left Arm, Patient Position: Sitting, Cuff Size: Normal)   Pulse 68   Ht 5\' 11"  (1.803 m)   Wt 212 lb 9.6 oz (96.4 kg)   SpO2 99%   BMI 29.65 kg/m     Wt Readings from Last 3 Encounters:  09/03/22 212 lb 9.6 oz (96.4 kg)  08/28/22 202 lb 12.8 oz (92 kg)  07/30/22 224 lb (101.6 kg)     General: 72 y.o. Caucasian male in no acute distress. HEENT: Normocephalic and atraumatic. Sclera clear.  Neck: Supple. No carotid bruits. No JVD. Heart: RRR. Distinct S1 and S2. No murmurs, gallops, or rubs.  Lungs: No increased work of breathing. Clear to ausculation bilaterally. No wheezes, rhonchi, or rales.  Abdomen: Soft, non-distended, and non-tender to palpation. Abdominal decision slightly erythematous around border but no drainage - staples still in place.  Extremities: 1+  pitting edema of right lower extremity. Trace pitting edema of left lower extremity Skin: Warm and dry. Neuro: Alert and oriented x3. No focal deficits. Psych: Normal affect. Responds appropriately.   Assessment:    1. Coronary artery disease involving native coronary artery of native heart without angina pectoris   2. Chronic HFrEF (heart failure with reduced ejection fraction) (HCC)   3. Lower extremity edema   4. Atrial flutter, unspecified type (HCC)   5. Primary hypertension   6. Hyperlipidemia, unspecified hyperlipidemia type   7. Stage 3 chronic kidney disease, unspecified whether stage 3a or 3b CKD (HCC)   8. Hypokalemia   9. Pre-op evaluation     Plan:    CAD Patient has a long history  of single vessel CAD involving the LAD s/p multiple PCIs dating back to 2001. Last PCI was in angioplasty for in-stent restenosis of LAD in 2015.  - No chest pain.  - No antiplatelet therapy given need for full anticoagulation. - Continue statin.   Chronic HFrEF Lower Extremity Edema Initial diagnosed in 12/2012 in setting of anterior MI with EF of 35-40% at that time. EF later normalized to 55-60% on TTE in 102023. However, TEE during recent admission in 04/2022 showed new drop in EF to 20-25%. This was felt to likely be tachy-mediated in nature. Repeat TTE in 07/2022 showed LVEF of 50% with hypokinesis of the mid anteroseptal segment, mid inferoseptal segment, and basal inferoseptal segment. Lower extremity doppler negative for DVT on the right. - Mild lower extremity edema (right leg chronically larger than right) and weight is up 10 lbs from where it was at discharge (but down from where it was in 05/2022). Does not appear significantly volume overloaded today. - Continue Lasix 40mg  twice daily. - Continue Entresto 24-26mg  twice daily. - Continue Spironolactone 12.5mg  daily. - Continue Jardiance 10mg  daily. - No beta-blocker due to history of bradycardia and pauses.  -  He admits to sodium  discretion which suspect is contributing to lower extremity edema. Discussed importance of daily weights and sodium restrictions. Also recommend compression stockings.  - Will check BNP and BMET today.  Persistent Atrial Flutter Pauses Initially diagnosed during recent admission and underwent successful TEE/DCCV on 04/2022. He was noted to be back in atrial flutter when he was readmitted later that month for altered mental status. His beta-blocker was increased but then was noted to have increasingly longer pauses (4 second pause followed immediately by a 5.8 second pause). Therefore, beta-blocker was stopped and he was started on Amiodarone and then underwent repeat TEE/DCCV in 05/2022 with restoration of sinus rhythm and no recurrent pauses. Amiodarone decreased during recent admission in 07/2022 due to bradycardia. - Maintaining sinus rhythm on exam.  - Continue Amiodarone 100mg  daily. - Currently on anticoagulation with Xarelto on 15mg  daily. He currently meets criteria for full dose of 20mg  daily based of CrCl >50. Will recheck BMET today and if CrCl remains above 50, will increase Xarelto to 20mg  daily.    Hypertension BP reasonably well controlled.  - Continue medications for CHF as above.   Hyperlipidemia Lipid panel in 07/2022: Total Cholesterol 148, Triglycerides 149, HDL 50, LDL 72. LDL goal <55 given multiple MIs.  - Continue Crestor 20mg  daily.  - LDL not quite at goal. Did not have time to discuss this today. Can discuss increasing to 40mg  daily at next visit.   CKD Stage III Baseline creatinine around 1.3 to 1.8. Stable at 1.76 on 08/28/2022.  - Will repeat BMET today.  Hypokalemia He had some intermittent hypokalemia during recent admission in 07/2022 with potassium as low as 2.9. He was supplemented and potassium stabilized to 3.9 on day of discharge.  - Will repeat BMET today.  Iron Deficiency Anemia Colon Adenocarcinoma Recently admitted with symptomatic anemia requiring  blood transfusion. Ultimately diagnosed and underwent right hemicolectomy. Biopsy was positive for invasive moderately differentiated adenocarcinoma that invades into pericolonic soft tissue. Resection margins were negative for carcinoma and negative for lymphovascular or perineural invasion. Hemoglobin 8.5 on discharge. - Still having a lot of abdominal pain following surgery.  - Will recheck CBC today to ensure hemoglobin is stable on Xarelto. - Discussed importance of keeping following up with General Surgery. He has not heard form Oncology  yet. Recommend following up with General Surgery about this.  Pre-Chart Evaluation Patient mentioned need for multiple teeth extraction in the near future. He is stable from a cardiac standpoint for this. Asked patient to have dentist send [place] a pre-op clearance form with the exact number of teeth needing to be extracted so that we can comment on whether anticoagulation needs to be held.  Disposition: Keep follow-up in the [facility] on 09/10/2022. Follow up with [place] in 3-4 months.   Medication Adjustments/Labs and Tests Ordered: Current medicines are reviewed at length with the patient today.  Concerns regarding medicines are outlined above.  Orders Placed This Encounter  Procedures   Basic metabolic panel   Brain natriuretic peptide   CBC   No orders of the defined types were placed in this encounter.   Patient Instructions  Medication Instructions:  Your physician recommends that you continue on your current medications as directed. Please refer to the Current Medication list given to you today.  *If you need a refill on your cardiac medications before your next appointment, please call your pharmacy*   Lab Work: Labs will be drawn today If you have labs (blood work) drawn today and your tests are completely normal, you will receive your results only by: MyChart Message (if you have MyChart) OR A paper copy in the mail If you have any  lab test that is abnormal or we need to change your treatment, we will call you to review the results.   Testing/Procedures: None ordered   Follow-Up: At [facility], you and your health needs are our priority.  As part of our continuing mission to provide you with exceptional heart care, we have created designated Provider Care Teams.  These Care Teams include your primary Cardiologist (physician) and Advanced Practice Providers (APPs -  Physician Assistants and Nurse Practitioners) who all work together to provide you with the care you need, when you need it.  We recommend signing up for the patient portal called "MyChart".  Sign up information is provided on this After Visit Summary.  MyChart is used to connect with patients for Virtual Visits (Telemedicine).  Patients are able to view lab/test results, encounter notes, upcoming appointments, etc.  Non-urgent messages can be sent to your provider as well.   To learn more about what you can do with MyChart, go to [email or web address].    Your next appointment:   3 month(s)  Provider:   Marjie Skiff, PA-C        Also, keep your already scheduled appointment with the Heart Failure Clinic 09/10/2022  Heart Failure Education: Weigh yourself EVERY morning after you go to the bathroom but before you eat or drink anything. Write this number down in a weight log/diary. If you gain 3 pounds overnight or 5 pounds in a week, call the office. Take your medicines as prescribed. If you have concerns about your medications, please call us before you stop taking them.  Eat low salt foods--Limit salt (sodium) to 2000 mg per day. This will help prevent your body from holding onto fluid. Read food labels as many processed foods have a lot of sodium, especially canned goods and prepackaged meats. If you would like some assistance choosing low sodium foods, we would be happy to set you up with a nutritionist. Limit all fluids for the day to less  than 2 liters (64 ounces). Fluid includes all drinks, coffee, juice, ice chips, soup, jello, and all other liquids. Stay as active  as you can everyday. Staying active will give you more energy and make your muscles stronger. Start with 5 minutes at a time and work your way up to 30 minutes a day. Break up your activities--do some in the morning and some in the afternoon. Start with 3 days per week and work your way up to 5 days as you can.  If you have chest pain, feel short of breath, dizzy, or lightheaded, STOP. If you don't feel better after a short rest, call 911. If you do feel better, call the office to let us know you have symptoms with exercise.     Signed, Corrin Parker, PA-C  09/03/2022 12:54 PM    [facility]

## 2022-08-22 ENCOUNTER — Encounter (HOSPITAL_COMMUNITY): Payer: Self-pay | Admitting: Internal Medicine

## 2022-08-22 ENCOUNTER — Inpatient Hospital Stay (HOSPITAL_COMMUNITY): Payer: Medicare HMO

## 2022-08-22 DIAGNOSIS — C183 Malignant neoplasm of hepatic flexure: Secondary | ICD-10-CM | POA: Diagnosis not present

## 2022-08-22 DIAGNOSIS — K449 Diaphragmatic hernia without obstruction or gangrene: Secondary | ICD-10-CM | POA: Diagnosis not present

## 2022-08-22 DIAGNOSIS — K635 Polyp of colon: Secondary | ICD-10-CM | POA: Diagnosis not present

## 2022-08-22 DIAGNOSIS — R195 Other fecal abnormalities: Secondary | ICD-10-CM | POA: Diagnosis not present

## 2022-08-22 DIAGNOSIS — K2970 Gastritis, unspecified, without bleeding: Secondary | ICD-10-CM | POA: Diagnosis not present

## 2022-08-22 DIAGNOSIS — K5730 Diverticulosis of large intestine without perforation or abscess without bleeding: Secondary | ICD-10-CM | POA: Diagnosis not present

## 2022-08-22 DIAGNOSIS — D500 Iron deficiency anemia secondary to blood loss (chronic): Secondary | ICD-10-CM | POA: Diagnosis not present

## 2022-08-22 DIAGNOSIS — K5710 Diverticulosis of small intestine without perforation or abscess without bleeding: Secondary | ICD-10-CM | POA: Diagnosis not present

## 2022-08-22 DIAGNOSIS — D649 Anemia, unspecified: Secondary | ICD-10-CM | POA: Diagnosis not present

## 2022-08-22 DIAGNOSIS — S2241XD Multiple fractures of ribs, right side, subsequent encounter for fracture with routine healing: Secondary | ICD-10-CM | POA: Diagnosis not present

## 2022-08-22 LAB — BASIC METABOLIC PANEL
Anion gap: 11 (ref 5–15)
BUN: 21 mg/dL (ref 8–23)
CO2: 25 mmol/L (ref 22–32)
Calcium: 8.7 mg/dL — ABNORMAL LOW (ref 8.9–10.3)
Chloride: 101 mmol/L (ref 98–111)
Creatinine, Ser: 2 mg/dL — ABNORMAL HIGH (ref 0.61–1.24)
GFR, Estimated: 35 mL/min — ABNORMAL LOW (ref >60)
Glucose, Bld: 102 mg/dL — ABNORMAL HIGH (ref 70–99)
Potassium: 3.5 mmol/L (ref 3.5–5.1)
Sodium: 137 mmol/L (ref 135–145)

## 2022-08-22 LAB — CBC
HCT: 29.2 % — ABNORMAL LOW (ref 39.0–52.0)
Hemoglobin: 9 g/dL — ABNORMAL LOW (ref 13.0–17.0)
MCH: 25.3 pg — ABNORMAL LOW (ref 26.0–34.0)
MCHC: 30.8 g/dL (ref 30.0–36.0)
MCV: 82 fL (ref 80.0–100.0)
Platelets: 253 10*3/uL (ref 150–400)
RBC: 3.56 MIL/uL — ABNORMAL LOW (ref 4.22–5.81)
RDW: 17.2 % — ABNORMAL HIGH (ref 11.5–15.5)
WBC: 11.8 10*3/uL — ABNORMAL HIGH (ref 4.0–10.5)
nRBC: 0 % (ref 0.0–0.2)

## 2022-08-22 LAB — SURGICAL PCR SCREEN
MRSA, PCR: NEGATIVE
Staphylococcus aureus: NEGATIVE

## 2022-08-22 MED ORDER — IOHEXOL 9 MG/ML PO SOLN
500.0000 mL | ORAL | Status: AC
  Administered 2022-08-22 (×2): 500 mL via ORAL

## 2022-08-22 MED ORDER — HEPARIN SODIUM (PORCINE) 5000 UNIT/ML IJ SOLN
5000.0000 [IU] | Freq: Once | INTRAMUSCULAR | Status: AC
  Administered 2022-08-23: 5000 [IU] via SUBCUTANEOUS
  Filled 2022-08-22: qty 1

## 2022-08-22 MED ORDER — ENSURE PRE-SURGERY PO LIQD
296.0000 mL | Freq: Once | ORAL | Status: AC
  Administered 2022-08-23: 296 mL via ORAL
  Filled 2022-08-22: qty 296

## 2022-08-22 MED ORDER — ALVIMOPAN 12 MG PO CAPS
12.0000 mg | ORAL_CAPSULE | ORAL | Status: AC
  Administered 2022-08-23: 12 mg via ORAL
  Filled 2022-08-22: qty 1

## 2022-08-22 MED ORDER — ACETAMINOPHEN 500 MG PO TABS
1000.0000 mg | ORAL_TABLET | ORAL | Status: AC
  Administered 2022-08-23: 1000 mg via ORAL
  Filled 2022-08-22: qty 2

## 2022-08-22 MED ORDER — POTASSIUM CHLORIDE CRYS ER 20 MEQ PO TBCR
40.0000 meq | EXTENDED_RELEASE_TABLET | ORAL | Status: AC
  Administered 2022-08-22 (×2): 40 meq via ORAL
  Filled 2022-08-22 (×2): qty 2

## 2022-08-22 MED ORDER — CHLORHEXIDINE GLUCONATE CLOTH 2 % EX PADS
6.0000 | MEDICATED_PAD | Freq: Once | CUTANEOUS | Status: AC
  Administered 2022-08-23: 6 via TOPICAL

## 2022-08-22 MED ORDER — SODIUM CHLORIDE 0.9 % IV SOLN
2.0000 g | INTRAVENOUS | Status: AC
  Administered 2022-08-23: 2 g via INTRAVENOUS
  Filled 2022-08-22: qty 2

## 2022-08-22 MED ORDER — NEOMYCIN SULFATE 500 MG PO TABS
1000.0000 mg | ORAL_TABLET | ORAL | Status: AC
  Administered 2022-08-22 – 2022-08-23 (×3): 1000 mg via ORAL
  Filled 2022-08-22 (×3): qty 2

## 2022-08-22 MED ORDER — ENSURE PRE-SURGERY PO LIQD
592.0000 mL | Freq: Once | ORAL | Status: AC
  Administered 2022-08-22: 592 mL via ORAL
  Filled 2022-08-22 (×2): qty 592

## 2022-08-22 MED ORDER — METRONIDAZOLE 500 MG PO TABS
1000.0000 mg | ORAL_TABLET | ORAL | Status: AC
  Administered 2022-08-22 – 2022-08-23 (×2): 1000 mg via ORAL
  Filled 2022-08-22 (×2): qty 2

## 2022-08-22 MED ORDER — CHLORHEXIDINE GLUCONATE CLOTH 2 % EX PADS
6.0000 | MEDICATED_PAD | Freq: Once | CUTANEOUS | Status: AC
  Administered 2022-08-22: 6 via TOPICAL

## 2022-08-22 NOTE — Progress Notes (Signed)
Patient for surgery tomorrow and he has due medications (Neomycin and Metronidazole) which should be given 2 hours apart after Miralax bowel prep the day prior to surgery however, the only Miralax ordered was the PRN. MD paged and asked about giving Miralax. He was fine giving the PRN one which is already on the patient's MAR.

## 2022-08-22 NOTE — Progress Notes (Signed)
Heart Failure Stewardship Pharmacist Progress Note   PCP: [name], MD PCP-Cardiologist: Charlton Haws, MD    HPI:  72 yo M with PMH of chronic HFrEF with EF as low as 35-40% in 12/2012 with normalization to 55-60% in 11/2021 but then down again to 20-25% on TEE in 04/2022, CAD, GIB, bradycardia, aflutter, HTN, HLD, OSA, CKD IIIa, and prior left nephrectomy.  Admitted in 12/2012 with STEMI s/p PCI to mild LAD. Readmitted 1 week later with in-stent restenosis complicated by VF on cath table, thought to be due to Brilinta failure vs noncompliance. LVEF was 35-40%.   Admitted in 04/2022 with altered mental status. Noncompliance to medications. Found to have atrial flutter and decline of LVEF. Underwent successful TEE/DCCV on 3/14. He left AMA.   Readmitted later that month with aflutter RVR, AMS, fall, and rib fractures. He was started on IV amiodarone and metoprolol. Amiodarone was later stopped due to concern for compliance. He was noted to have some pauses on telemetry so a live Zio monitor was placed and plan was for discharge to SNF on 05/24/2022; however, while waiting for transportation, he was noted to have a 4 second pause followed by a 5.8 second pause. Discharge was cancelled and BB was stopped. EP was consulted and he was restarted on IV amiodarone and then underwent repeat TEE/DCCV on 05/28/2022 with restoration of sinus rhythm. EP recommended avoiding permament pacemaker if at all possible given compliance issues.   Admitted 07/2022 with acute blood loss anemia requiring PRBC. GI recommended colonoscopy but patient declined, treated with IV iron. He did not follow up as outpatient.   Presented to the ED on 6/21 with shortness of breath, fatigue, weight gain, and LE edema despite taking increased lasix at home after speaking with outpatient cardiology on 6/18. CXR with mild cardiac enlargement. ECHO 6/22 showed LVEF 50%, regional wall motion abnormalities, RV normal.   Current HF  Medications: Diuretic: furosemide 40 mg PO BID SGLT2i: Jardiance 10 mg daily Other: Ferrlecit 250 mg IV daily  Prior to admission HF Medications: Diuretic: furosemide 40 mg daily ACE/ARB/ARNI: Entresto 24/26 mg BID MRA: spironolactone 12.5 mg daily SGLT2i: Jardiance 10 mg daily  Pertinent Lab Values: Serum creatinine 2.00, BUN 21, Potassium 3.5, Sodium 137, BNP 26.9>41.5, Magnesium 2.4 TSAT 3, Ferritin 17  Vital Signs: Weight: 203 lbs (admission weight: 216 lbs) Blood pressure: 120-170/50s  Heart rate: 60s  I/O: -0.7L yesterday; net -6.1L  Medication Assistance / Insurance Benefits Check: Does the patient have prescription insurance?  Yes Type of insurance plan: [employer]  Outpatient Pharmacy:  Prior to admission outpatient pharmacy: [facility], mail order Is the patient willing to use [facility] TOC pharmacy at discharge? Yes Is the patient willing to transition their outpatient pharmacy to utilize a [facility] outpatient pharmacy?   No    Assessment: 1. Acute on chronic HFimpEF (LVEF 50%), due to ICM. NYHA class II symptoms. - Continue furosemide 40 mg PO BID. Strict I/Os and daily weights. Keep K>4 and Mg>2. Consider replacing with KCl 40 mEq x 2. K has remained 3.5-3.6 with 40 mEq daily. - No BB with bradycardia - Consider restarting Entresto 24/26 mg BID or spironolactone prior to discharge vs as outpatient pending renal function improvement. Baseline creatinine ~1.6 - Continue Jardiance 10 mg daily - Iron stores low. Continue Ferrlecit 250 mg IV daily.   Plan: 1) Medication changes recommended at this time: - KCl 40 mEq x 2 - Restart Entresto 24/26 mg BID or spironolactone prior to  discharge vs as outpatient if creatinine improves  2) Patient assistance: - None pending - patient states all copays are affordable for him. His daughter helps with medications at home and sets them up in pill packs for him.  3)  Education  - Patient has been educated on current HF medications  and potential additions to HF medication regimen - Patient verbalizes understanding that over the next few months, these medication doses may change and more medications may be added to optimize HF regimen - Patient has been educated on basic disease state pathophysiology and goals of therapy   Sharen Hones, PharmD, BCPS Heart Failure Stewardship Pharmacist Phone [phone]

## 2022-08-22 NOTE — Progress Notes (Signed)
Patient doing fine without signs of bleeding we reviewed his biopsies discussed the need for surgical consultation and I will call their office and ask them to see today and he is eating regular food please let me know if I could be of any further assistance with this hospital stay

## 2022-08-22 NOTE — Progress Notes (Signed)
PROGRESS NOTE    Brendan Butler  GMW:[id number] DOB: 03-Jan-1951 DOA: 08/17/2022 PCP: [name], MD  71/M with history of CAD, multiple PCI and stenting, atrial flutter, depression, hypertension, OSA, CKD 3, bronchiectasis, left nephrectomy presented to the ED with shortness of breath and edema, progressive weight gain and fatigue. -Recently admitted with worsening anemia and positive Hemoccult, transfused PRBC, declined colonoscopy then. -In the ED heart rate 40-50, creatinine 1.9, hemoglobin 7.3, chest x-ray noted mild cardiomegaly. -Admitted, improving with diuretics, blood transfusion -Gastroenterology consulted for iron deficiency anemia, colonoscopy 6/24 noted colon mass, biopsy noted invasive moderate to well-differentiated adeno CA -6/26, surgery consulted  Subjective: -Feels okay, breathing improving, ambulating now, discussed biopsy results,  Assessment and Plan:  Colon adenocarcinoma, new diagnosis Iron deficiency anemia -Recent diagnosis, had heme positive stools on admission few weeks ago, declined GI workup then, now amenable -Holding Xarelto [facility] gastroenterology consulted, EGD/colonoscopy 6/25, colon mass noted, biopsy pending -Transfused 1 unit PRBC, given IV iron -Hemoglobin stable, improving -Request surgery evaluation  Acute on chronic diastolic CHF -Last echo 6/24 with EF 50%, regional wall motion abnormalities, normal RV -Worsened in the setting of anemia, troponin is normal -Now improving, volume status has improved, he is 5.7 L negative, weight down 13 LB -Changed to oral Lasix, continue Jardiance -CHF TOC follow-up made  AKI on CKD 3a -Likely hemodynamically mediated, compounded by worsening anemia -Holding Entresto, baseline creatinine around 1.6, now 1.9-2 -Creatinine plateauing around this level  Lung Nodule -Concerning for adeno CA, noted on prior imaging, followed by Dr. Delton Coombes, he missed his appointment for PET scan -This will need to be  rescheduled, emphasized with patient regarding importance of this workup  Atrial flutter -With bradycardia, currently on amiodarone,  -Heart rate improved, Xarelto held with colon cancer and GI bleed  CAD HLD > Status post multiple stenting > Does notably have an aspirin allergy - Continue  statin,  - Hold Xarelto as above   Depression - Continue home bupropion, paroxetine, Seroquel   Hypertension -Holding Entresto and Aldactone    OSA - Not on CPAP per chart review   DVT prophylaxis: SCDs Code Status: Full code Family Communication: None present Disposition Plan: Home pending ongoing workup  Consultants: Gastroenterology, cards, general surgery  Antimicrobials:    Objective: Vitals:   08/21/22 2124 08/22/22 0018 08/22/22 0444 08/22/22 0824  BP: (!) 141/61 (!) 122/40 (!) 148/49 (!) 172/55  Pulse:  (!) 59 (!) 56 65  Resp: 16 18 20    Temp: 97.9 F (36.6 C) 97.9 F (36.6 C) 97.8 F (36.6 C) 97.6 F (36.4 C)  TempSrc: Oral Oral Oral Oral  SpO2: 95% 95% 94% 96%  Weight:   92.3 kg   Height:        Intake/Output Summary (Last 24 hours) at 08/22/2022 1114 Last data filed at 08/22/2022 0800 Gross per 24 hour  Intake 3 ml  Output 325 ml  Net -322 ml   Filed Weights   08/20/22 0402 08/21/22 0439 08/22/22 0444  Weight: 93.6 kg 92.3 kg 92.3 kg    Examination:  Obese pleasant male sitting up in bed, AAOx3, no distress HEENT: No JVD CVS: S1-S2, regular rhythm Lungs: Decreased breath sounds at the bases Abdomen: Soft, nontender, bowel sounds present Extremities: Trace edema  Skin: No rashes Psychiatry:  Mood & affect appropriate.     Data Reviewed:   CBC: Recent Labs  Lab 08/18/22 0055 08/19/22 0036 08/20/22 0032 08/21/22 0041 08/22/22 0048  WBC 8.8 9.8 10.6* 11.9* 11.8*  HGB 9.0* 9.5* 9.2* 9.1* 9.0*  HCT 29.7* 30.7* 30.6* 29.5* 29.2*  MCV 85.3 81.4 82.0 81.7 82.0  PLT 237 267 264 261 253   Basic Metabolic Panel: Recent Labs  Lab  08/17/22 1017 08/18/22 0055 08/19/22 0036 08/20/22 0032 08/21/22 0041 08/22/22 0048  NA  --  140 138 137 138 137  K  --  4.4 3.8 3.5 3.5 3.5  CL  --  104 101 99 101 101  CO2  --  26 26 26 27 25   GLUCOSE  --  92 92 90 98 102*  BUN  --  22 22 24* 25* 21  CREATININE  --  2.02* 1.81* 1.93* 1.99* 2.00*  CALCIUM  --  8.5* 8.7* 8.4* 8.6* 8.7*  MG 2.4  --   --   --   --   --    GFR: Estimated Creatinine Clearance: 39.8 mL/min (A) (by C-G formula based on SCr of 2 mg/dL (H)). Liver Function Tests: Recent Labs  Lab 08/17/22 1619 08/18/22 0055  AST 16 17  ALT 15 14  ALKPHOS 73 67  BILITOT 0.7 0.7  PROT 6.6 6.4*  ALBUMIN 3.6 3.4*   No results for input(s): "LIPASE", "AMYLASE" in the last 168 hours. No results for input(s): "AMMONIA" in the last 168 hours. Coagulation Profile: No results for input(s): "INR", "PROTIME" in the last 168 hours. Cardiac Enzymes: No results for input(s): "CKTOTAL", "CKMB", "CKMBINDEX", "TROPONINI" in the last 168 hours. BNP (last 3 results) No results for input(s): "PROBNP" in the last 8760 hours. HbA1C: No results for input(s): "HGBA1C" in the last 72 hours. CBG: No results for input(s): "GLUCAP" in the last 168 hours. Lipid Profile: No results for input(s): "CHOL", "HDL", "LDLCALC", "TRIG", "CHOLHDL", "LDLDIRECT" in the last 72 hours. Thyroid Function Tests: No results for input(s): "TSH", "T4TOTAL", "FREET4", "T3FREE", "THYROIDAB" in the last 72 hours.  Anemia Panel: No results for input(s): "VITAMINB12", "FOLATE", "FERRITIN", "TIBC", "IRON", "RETICCTPCT" in the last 72 hours.  Urine analysis:    Component Value Date/Time   COLORURINE AMBER (A) 05/20/2022 2038   APPEARANCEUR CLEAR 05/20/2022 2038   LABSPEC 1.034 (H) 05/20/2022 2038   PHURINE 5.0 05/20/2022 2038   GLUCOSEU NEGATIVE 05/20/2022 2038   HGBUR NEGATIVE 05/20/2022 2038   BILIRUBINUR NEGATIVE 05/20/2022 2038   KETONESUR NEGATIVE 05/20/2022 2038   PROTEINUR NEGATIVE 05/20/2022  2038   NITRITE NEGATIVE 05/20/2022 2038   LEUKOCYTESUR NEGATIVE 05/20/2022 2038   Sepsis Labs: @LABRCNTIP (procalcitonin:4,lacticidven:4)  )No results found for this or any previous visit (from the past 240 hour(s)).   Radiology Studies: No results found.   Scheduled Meds:  sodium chloride   Intravenous Once   amiodarone  100 mg Oral Daily   buPROPion  150 mg Oral Daily   empagliflozin  10 mg Oral Daily   furosemide  40 mg Oral BID   pantoprazole  40 mg Oral Daily   PARoxetine  30 mg Oral Daily   potassium chloride  40 mEq Oral Q4H   pregabalin  150 mg Oral TID   QUEtiapine  25 mg Oral QHS   rosuvastatin  20 mg Oral Daily   sodium chloride flush  3 mL Intravenous Q12H   Continuous Infusions:  sodium chloride 10 mL/hr at 08/20/22 0136   ferric gluconate (FERRLECIT) IVPB 250 mg (08/22/22 1010)   promethazine (PHENERGAN) injection (IM or IVPB) 12.5 mg (08/19/22 1654)     LOS: 4 days    Time spent:    Zannie Cove, MD  [facility]   08/22/2022, 11:14 AM
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
## 2022-08-23 ENCOUNTER — Inpatient Hospital Stay (HOSPITAL_COMMUNITY): Payer: Medicare HMO | Admitting: Anesthesiology

## 2022-08-23 ENCOUNTER — Encounter (HOSPITAL_COMMUNITY): Payer: Self-pay | Admitting: Internal Medicine

## 2022-08-23 ENCOUNTER — Encounter (HOSPITAL_COMMUNITY)
Admission: EM | Disposition: A | Discharge status: Home / Self Care | Payer: Self-pay | Source: Home / Self Care | Attending: Internal Medicine

## 2022-08-23 DIAGNOSIS — D631 Anemia in chronic kidney disease: Secondary | ICD-10-CM | POA: Diagnosis not present

## 2022-08-23 DIAGNOSIS — I129 Hypertensive chronic kidney disease with stage 1 through stage 4 chronic kidney disease, or unspecified chronic kidney disease: Secondary | ICD-10-CM

## 2022-08-23 DIAGNOSIS — N189 Chronic kidney disease, unspecified: Secondary | ICD-10-CM

## 2022-08-23 DIAGNOSIS — C189 Malignant neoplasm of colon, unspecified: Secondary | ICD-10-CM

## 2022-08-23 DIAGNOSIS — I2510 Atherosclerotic heart disease of native coronary artery without angina pectoris: Secondary | ICD-10-CM

## 2022-08-23 DIAGNOSIS — D630 Anemia in neoplastic disease: Secondary | ICD-10-CM | POA: Diagnosis not present

## 2022-08-23 DIAGNOSIS — I252 Old myocardial infarction: Secondary | ICD-10-CM | POA: Diagnosis not present

## 2022-08-23 DIAGNOSIS — C183 Malignant neoplasm of hepatic flexure: Secondary | ICD-10-CM | POA: Diagnosis not present

## 2022-08-23 DIAGNOSIS — D649 Anemia, unspecified: Secondary | ICD-10-CM | POA: Diagnosis not present

## 2022-08-23 DIAGNOSIS — Z87891 Personal history of nicotine dependence: Secondary | ICD-10-CM | POA: Diagnosis not present

## 2022-08-23 HISTORY — PX: LAPAROSCOPIC PARTIAL COLECTOMY: SHX5907

## 2022-08-23 LAB — CBC
HCT: 32.8 % — ABNORMAL LOW (ref 39.0–52.0)
Hemoglobin: 9.8 g/dL — ABNORMAL LOW (ref 13.0–17.0)
MCH: 24.6 pg — ABNORMAL LOW (ref 26.0–34.0)
MCHC: 29.9 g/dL — ABNORMAL LOW (ref 30.0–36.0)
MCV: 82.2 fL (ref 80.0–100.0)
Platelets: 271 10*3/uL (ref 150–400)
RBC: 3.99 MIL/uL — ABNORMAL LOW (ref 4.22–5.81)
RDW: 17.5 % — ABNORMAL HIGH (ref 11.5–15.5)
WBC: 14.5 10*3/uL — ABNORMAL HIGH (ref 4.0–10.5)
nRBC: 0 % (ref 0.0–0.2)

## 2022-08-23 LAB — BASIC METABOLIC PANEL
Anion gap: 11 (ref 5–15)
BUN: 19 mg/dL (ref 8–23)
CO2: 25 mmol/L (ref 22–32)
Calcium: 8.9 mg/dL (ref 8.9–10.3)
Chloride: 100 mmol/L (ref 98–111)
Creatinine, Ser: 1.96 mg/dL — ABNORMAL HIGH (ref 0.61–1.24)
GFR, Estimated: 36 mL/min — ABNORMAL LOW (ref >60)
Glucose, Bld: 93 mg/dL (ref 70–99)
Potassium: 3.2 mmol/L — ABNORMAL LOW (ref 3.5–5.1)
Sodium: 136 mmol/L (ref 135–145)

## 2022-08-23 LAB — CANCER ANTIGEN 19-9: CA 19-9: 25 U/mL (ref 0–35)

## 2022-08-23 LAB — SURGICAL PATHOLOGY

## 2022-08-23 LAB — TYPE AND SCREEN
ABO/RH(D): O POS
Antibody Screen: NEGATIVE

## 2022-08-23 LAB — CEA: CEA: 6.6 ng/mL — ABNORMAL HIGH (ref 0.0–4.7)

## 2022-08-23 SURGERY — LAPAROSCOPIC PARTIAL COLECTOMY
Anesthesia: General | Laterality: Right

## 2022-08-23 MED ORDER — ONDANSETRON HCL 4 MG/2ML IJ SOLN: 4.0000 mg | Freq: Once | INTRAMUSCULAR | Status: DC | PRN

## 2022-08-23 MED ORDER — LACTATED RINGERS IV SOLN: INTRAVENOUS | Status: DC

## 2022-08-23 MED ORDER — SODIUM CHLORIDE 0.9 % IR SOLN
Status: DC | PRN
  Administered 2022-08-23: 1000 mL

## 2022-08-23 MED ORDER — OXYCODONE HCL 5 MG/5ML PO SOLN: 5.0000 mg | Freq: Once | ORAL | Status: DC | PRN

## 2022-08-23 MED ORDER — ONDANSETRON HCL 4 MG/2ML IJ SOLN
INTRAMUSCULAR | Status: DC | PRN
  Administered 2022-08-23: 4 mg via INTRAVENOUS

## 2022-08-23 MED ORDER — ONDANSETRON HCL 4 MG/2ML IJ SOLN
INTRAMUSCULAR | Status: AC
  Filled 2022-08-23: qty 2

## 2022-08-23 MED ORDER — BUPIVACAINE HCL (PF) 0.25 % IJ SOLN
INTRAMUSCULAR | Status: AC
  Filled 2022-08-23: qty 30

## 2022-08-23 MED ORDER — PROPOFOL 500 MG/50ML IV EMUL
INTRAVENOUS | Status: DC | PRN
  Administered 2022-08-23: 25 ug/kg/min via INTRAVENOUS

## 2022-08-23 MED ORDER — ROCURONIUM BROMIDE 10 MG/ML (PF) SYRINGE
PREFILLED_SYRINGE | INTRAVENOUS | Status: AC
  Filled 2022-08-23: qty 10

## 2022-08-23 MED ORDER — LIDOCAINE 2% (20 MG/ML) 5 ML SYRINGE
INTRAMUSCULAR | Status: DC | PRN
  Administered 2022-08-23: 60 mg via INTRAVENOUS

## 2022-08-23 MED ORDER — POTASSIUM CHLORIDE CRYS ER 20 MEQ PO TBCR
40.0000 meq | EXTENDED_RELEASE_TABLET | ORAL | Status: AC
  Administered 2022-08-23 (×2): 40 meq via ORAL
  Filled 2022-08-23 (×2): qty 2

## 2022-08-23 MED ORDER — ORAL CARE MOUTH RINSE: 15.0000 mL | Freq: Once | OROMUCOSAL | Status: AC

## 2022-08-23 MED ORDER — OXYCODONE HCL 5 MG PO TABS
5.0000 mg | ORAL_TABLET | ORAL | Status: DC | PRN
  Administered 2022-08-23 – 2022-08-25 (×7): 10 mg via ORAL
  Administered 2022-08-26: 5 mg via ORAL
  Filled 2022-08-23: qty 1
  Filled 2022-08-23 (×7): qty 2

## 2022-08-23 MED ORDER — BUPIVACAINE HCL 0.25 % IJ SOLN
INTRAMUSCULAR | Status: DC | PRN
  Administered 2022-08-23: 12 mL

## 2022-08-23 MED ORDER — LIDOCAINE 2% (20 MG/ML) 5 ML SYRINGE
INTRAMUSCULAR | Status: AC
  Filled 2022-08-23: qty 5

## 2022-08-23 MED ORDER — ROCURONIUM BROMIDE 10 MG/ML (PF) SYRINGE
PREFILLED_SYRINGE | INTRAVENOUS | Status: DC | PRN
  Administered 2022-08-23: 60 mg via INTRAVENOUS
  Administered 2022-08-23: 40 mg via INTRAVENOUS

## 2022-08-23 MED ORDER — HYDROMORPHONE HCL 1 MG/ML IJ SOLN
INTRAMUSCULAR | Status: DC | PRN
  Administered 2022-08-23 (×2): .5 mg via INTRAVENOUS

## 2022-08-23 MED ORDER — MORPHINE SULFATE (PF) 2 MG/ML IV SOLN
1.0000 mg | INTRAVENOUS | Status: DC | PRN
  Administered 2022-08-23 – 2022-08-26 (×4): 2 mg via INTRAVENOUS
  Filled 2022-08-23 (×4): qty 1

## 2022-08-23 MED ORDER — FENTANYL CITRATE (PF) 250 MCG/5ML IJ SOLN
INTRAMUSCULAR | Status: DC | PRN
  Administered 2022-08-23: 100 ug via INTRAVENOUS
  Administered 2022-08-23 (×3): 50 ug via INTRAVENOUS
  Administered 2022-08-23 (×2): 100 ug via INTRAVENOUS
  Administered 2022-08-23: 50 ug via INTRAVENOUS

## 2022-08-23 MED ORDER — PROPOFOL 10 MG/ML IV BOLUS
INTRAVENOUS | Status: DC | PRN
  Administered 2022-08-23: 130 mg via INTRAVENOUS

## 2022-08-23 MED ORDER — HYDROMORPHONE HCL 1 MG/ML IJ SOLN
INTRAMUSCULAR | Status: AC
  Filled 2022-08-23: qty 0.5

## 2022-08-23 MED ORDER — FENTANYL CITRATE (PF) 250 MCG/5ML IJ SOLN
INTRAMUSCULAR | Status: AC
  Filled 2022-08-23: qty 5

## 2022-08-23 MED ORDER — CHLORHEXIDINE GLUCONATE 0.12 % MT SOLN
OROMUCOSAL | Status: AC
  Administered 2022-08-23: 15 mL via OROMUCOSAL
  Filled 2022-08-23: qty 15

## 2022-08-23 MED ORDER — ACETAMINOPHEN 500 MG PO TABS
1000.0000 mg | ORAL_TABLET | Freq: Four times a day (QID) | ORAL | Status: DC
  Administered 2022-08-23 – 2022-08-28 (×17): 1000 mg via ORAL
  Filled 2022-08-23 (×20): qty 2

## 2022-08-23 MED ORDER — FENTANYL CITRATE (PF) 100 MCG/2ML IJ SOLN: 25.0000 ug | INTRAMUSCULAR | Status: DC | PRN

## 2022-08-23 MED ORDER — OXYCODONE HCL 5 MG PO TABS: 5.0000 mg | ORAL_TABLET | Freq: Once | ORAL | Status: DC | PRN

## 2022-08-23 MED ORDER — METHOCARBAMOL 500 MG PO TABS: 500.0000 mg | ORAL_TABLET | Freq: Three times a day (TID) | ORAL | Status: DC | PRN

## 2022-08-23 MED ORDER — LABETALOL HCL 5 MG/ML IV SOLN
INTRAVENOUS | Status: AC
  Filled 2022-08-23: qty 4

## 2022-08-23 MED ORDER — LABETALOL HCL 5 MG/ML IV SOLN
10.0000 mg | INTRAVENOUS | Status: DC | PRN
  Administered 2022-08-23: 10 mg via INTRAVENOUS

## 2022-08-23 MED ORDER — PROPOFOL 10 MG/ML IV BOLUS
INTRAVENOUS | Status: AC
  Filled 2022-08-23: qty 20

## 2022-08-23 MED ORDER — SUGAMMADEX SODIUM 200 MG/2ML IV SOLN
INTRAVENOUS | Status: DC | PRN
  Administered 2022-08-23: 400 mg via INTRAVENOUS

## 2022-08-23 MED ORDER — DEXAMETHASONE SODIUM PHOSPHATE 10 MG/ML IJ SOLN
INTRAMUSCULAR | Status: AC
  Filled 2022-08-23: qty 1

## 2022-08-23 MED ORDER — CHLORHEXIDINE GLUCONATE 0.12 % MT SOLN: 15.0000 mL | Freq: Once | OROMUCOSAL | Status: AC

## 2022-08-23 SURGICAL SUPPLY — 81 items
APL PRP STRL LF DISP 70% ISPRP (MISCELLANEOUS) ×1
APPLIER CLIP ROT 10 11.4 M/L (STAPLE)
APR CLP MED LRG 11.4X10 (STAPLE)
BAG COUNTER SPONGE SURGICOUNT (BAG) ×1 IMPLANT
BAG SPNG CNTER NS LX DISP (BAG) ×1
BLADE CLIPPER SURG (BLADE) IMPLANT
CANISTER SUCT 3000ML PPV (MISCELLANEOUS) ×1 IMPLANT
CELLS DAT CNTRL 66122 CELL SVR (MISCELLANEOUS) IMPLANT
CHLORAPREP W/TINT 26 (MISCELLANEOUS) ×1 IMPLANT
CLIP APPLIE ROT 10 11.4 M/L (STAPLE) IMPLANT
COVER MAYO STAND STRL (DRAPES) ×1 IMPLANT
COVER SURGICAL LIGHT HANDLE (MISCELLANEOUS) ×2 IMPLANT
DEFOGGER SCOPE WARMER CLEARIFY (MISCELLANEOUS) ×1 IMPLANT
DISSECTOR BLUNT TIP ENDO 5MM (MISCELLANEOUS) IMPLANT
DRAPE HALF SHEET 40X57 (DRAPES) IMPLANT
DRAPE UTILITY XL STRL (DRAPES) ×1 IMPLANT
DRAPE WARM FLUID 44X44 (DRAPES) ×1 IMPLANT
DRSG COVADERM PLUS 2X2 (GAUZE/BANDAGES/DRESSINGS) IMPLANT
DRSG OPSITE POSTOP 4X10 (GAUZE/BANDAGES/DRESSINGS) IMPLANT
DRSG OPSITE POSTOP 4X8 (GAUZE/BANDAGES/DRESSINGS) IMPLANT
ELECT BLADE 6.5 EXT (BLADE) ×1 IMPLANT
ELECT CAUTERY BLADE 6.4 (BLADE) ×1 IMPLANT
ELECT REM PT RETURN 9FT ADLT (ELECTROSURGICAL) ×1
ELECTRODE REM PT RTRN 9FT ADLT (ELECTROSURGICAL) ×1 IMPLANT
GEL ULTRASOUND 20GR AQUASONIC (MISCELLANEOUS) IMPLANT
GLOVE BIO SURGEON STRL SZ7.5 (GLOVE) ×4 IMPLANT
GLOVE BIOGEL PI IND STRL 8 (GLOVE) ×2 IMPLANT
GOWN STRL REUS W/ TWL LRG LVL3 (GOWN DISPOSABLE) ×6 IMPLANT
GOWN STRL REUS W/ TWL XL LVL3 (GOWN DISPOSABLE) ×2 IMPLANT
GOWN STRL REUS W/TWL LRG LVL3 (GOWN DISPOSABLE) ×6
GOWN STRL REUS W/TWL XL LVL3 (GOWN DISPOSABLE) ×2
IRRIG SUCT STRYKERFLOW 2 WTIP (MISCELLANEOUS)
IRRIGATION SUCT STRKRFLW 2 WTP (MISCELLANEOUS) IMPLANT
KIT BASIN OR (CUSTOM PROCEDURE TRAY) ×1 IMPLANT
KIT SIGMOIDOSCOPE (SET/KITS/TRAYS/PACK) IMPLANT
KIT TURNOVER KIT B (KITS) ×1 IMPLANT
LEGGING LITHOTOMY PAIR STRL (DRAPES) IMPLANT
LIGASURE IMPACT 36 18CM CVD LR (INSTRUMENTS) IMPLANT
LIGASURE LAP MARYLAND 5MM 37CM (ELECTROSURGICAL) IMPLANT
NDL INSUFFLATION 14GA 120MM (NEEDLE) IMPLANT
NEEDLE INSUFFLATION 14GA 120MM (NEEDLE) IMPLANT
NS IRRIG 1000ML POUR BTL (IV SOLUTION) ×2 IMPLANT
PAD ARMBOARD 7.5X6 YLW CONV (MISCELLANEOUS) ×2 IMPLANT
PENCIL SMOKE EVACUATOR (MISCELLANEOUS) ×2 IMPLANT
POUCH LAPAROSCOPIC INSTRUMENT (MISCELLANEOUS) ×1 IMPLANT
RELOAD PROXIMATE 75MM BLUE (ENDOMECHANICALS) ×3 IMPLANT
RELOAD STAPLE 75 3.8 BLU REG (ENDOMECHANICALS) IMPLANT
RETRACTOR WND ALEXIS 18 MED (MISCELLANEOUS) IMPLANT
RTRCTR WOUND ALEXIS 18CM MED (MISCELLANEOUS)
SCISSORS LAP 5X35 DISP (ENDOMECHANICALS) ×1 IMPLANT
SET TUBE SMOKE EVAC HIGH FLOW (TUBING) ×1 IMPLANT
SHEARS HARMONIC ACE PLUS 36CM (ENDOMECHANICALS) ×1 IMPLANT
SLEEVE Z-THREAD 5X100MM (TROCAR) ×1 IMPLANT
SPECIMEN JAR LARGE (MISCELLANEOUS) ×1 IMPLANT
STAPLER PROXIMATE 75MM BLUE (STAPLE) IMPLANT
STAPLER VISISTAT 35W (STAPLE) ×1 IMPLANT
SURGILUBE 2OZ TUBE FLIPTOP (MISCELLANEOUS) IMPLANT
SUT PDS AB 1 CT 36 (SUTURE) ×2 IMPLANT
SUT PDS AB 1 TP1 96 (SUTURE) IMPLANT
SUT PROLENE 2 0 CT2 30 (SUTURE) IMPLANT
SUT PROLENE 2 0 KS (SUTURE) IMPLANT
SUT SILK 2 0 (SUTURE) ×1
SUT SILK 2 0 SH CR/8 (SUTURE) ×1 IMPLANT
SUT SILK 2-0 18XBRD TIE 12 (SUTURE) ×1 IMPLANT
SUT SILK 3 0 (SUTURE) ×1
SUT SILK 3 0 SH CR/8 (SUTURE) ×1 IMPLANT
SUT SILK 3-0 18XBRD TIE 12 (SUTURE) ×1 IMPLANT
SYR BULB IRRIG 60ML STRL (SYRINGE) ×1 IMPLANT
SYS LAPSCP GELPORT 120MM (MISCELLANEOUS)
SYSTEM LAPSCP GELPORT 120MM (MISCELLANEOUS) IMPLANT
TOWEL GREEN STERILE (TOWEL DISPOSABLE) ×2 IMPLANT
TRAY FOLEY MTR SLVR 16FR STAT (SET/KITS/TRAYS/PACK) ×1 IMPLANT
TRAY LAPAROSCOPIC MC (CUSTOM PROCEDURE TRAY) ×1 IMPLANT
TROCAR 11X100 Z THREAD (TROCAR) IMPLANT
TROCAR BALLN 12MMX100 BLUNT (TROCAR) IMPLANT
TROCAR Z THREAD OPTICAL 12X100 (TROCAR) IMPLANT
TROCAR Z-THREAD OPTICAL 5X100M (TROCAR) ×1 IMPLANT
TUBE CONNECTING 12X1/4 (SUCTIONS) ×2 IMPLANT
WARMER LAPAROSCOPE (MISCELLANEOUS) ×1 IMPLANT
WATER STERILE IRR 1000ML POUR (IV SOLUTION) ×1 IMPLANT
YANKAUER SUCT BULB TIP NO VENT (SUCTIONS) ×2 IMPLANT

## 2022-08-23 NOTE — Op Note (Signed)
08/23/2022  11:18 AM  PATIENT:  Brendan Butler  72 y.o. male  PRE-OPERATIVE DIAGNOSIS: Right colon mass, anemia  POST-OPERATIVE DIAGNOSIS: Right colon mass, anemia  PROCEDURE:  Procedure(s): LAPAROSCOPIC CONVERTED TO OPEN HEMICOLECTOMY (Right) with ileocolonic anastomosis  SURGEON:  Surgeon(s) and Role:    Axel Filler, MD - Primary   ASSISTANTS: Rockwell Germany, RNFA  ANESTHESIA:   local and general  EBL:  200 mL   BLOOD ADMINISTERED:none  DRAINS: none   LOCAL MEDICATIONS USED:  BUPIVICAINE   SPECIMEN:  Source of Specimen: Right colon with tattoo at the hepatic flexure  DISPOSITION OF SPECIMEN:  PATHOLOGY  COUNTS:  YES  TOURNIQUET:  * No tourniquets in log *  DICTATION: .Dragon Dictation  Indication procedure: Patient is a 72 year old male with a history of anemia.  Patient was admitted to the hospital underwent colonoscopy.  He was found to have a right colonic mass.    Patient was counseled and had this and was taken to the OR for right colectomy.   Findings: Patient with a hepatic flexure tattoo and mass.  Patient underwent right colectomy.  The anastomosis and transverse colon transection site was just proximal to the right middle colonic vessels.   Details of procedure: After the patient was consented he was taken back to the OR and placed supine position with bilateral SCDs in place. General endotracheal intubation.  He was then prepped and draped in standard fashion.  A timeout was called and all facts verified.  At this time a Veress needle technique was used insufflate the abdomen to 15 mmHg in the left subcostal margin.  Subsequent to the 5 mm trocar and camera placed intra-abdominal.  There was no injury to any intra-abdominal organs.  At this time it appeared there was a significant amount of omental adhesions to the midline abdominal wall.  A second 5 mm trocar was placed in the left lower quadrant.  At this time I was able to bluntly sharply take  these down from the anterior abdominal wall.  A third infraumbilical midline incision was and trocar was then placed as was the right lower quadrant 5 mm trocar.  At this time the patient was position.  I was able to visualize the tattoo.  The right colon was adherent to the right lateral abdominal wall.  I proceeded from a lateral to medial direction to help freed this off the lateral abdominal wall.  The omentum was retracted cephalad.  I attempted to access the mesenteric window over the duodenum however secondary to the patient's habitus this was difficult to achieve.  I did continue to mobilize the colon from a lateral medial direction.  Secondary to exposure I decided to proceed with a laparotomy. At this time a #10 blade was used to make a midline incision.  Cautery was used to maintain hemostasis and dissection was taken down linea alba.  This was incised.  The fascia was extended to length of skin incision.  At this time the right colon was medialized from the retroperitoneum.  There was some thin adhesions of the terminal ileum down to the right pelvis.  These were incised to help bring this up to the wound.  An area just proximal to the right middle colic artery was chosen for the transection of the transverse colon.  At this time the terminal ileum was transected after a mesenteric window was created.  This was done using a 75 GIA stapler.  The LigaSure device was then used to  ligate the mesentery towards the transverse colon transection site.  At this time the transverse colon mesenteric window was created.  A 75 GIA stapler was then used to transect the transverse colon.  At this time we placed towels down.  The the colon protocol was undertaken.  At this time the edge of the staple line was transected with cautery and removed to help create the anastomosis.  This was done using a 75 GIA stapler.  The anastomosis was closed using another firing of the 75 GIA stapler.  The staple lines were  offset.  A 3-0 silk was used and placed as an apex stitch at the anastomosis. The staple lines were hemostatic.  At this time the mesenteric defect was reapproximated using figure-of-eight 3 oh silks in a figure-of-eight fashion.  At this time the abdominal cavity was irrigated out with sterile saline.  The omentum was brought over the midline wound.  We changed drapes and gloves and gowns as per the colon protocol.  At this time #1 PDS single-stranded x 2 was used to run and closed the fascial layer.  The skin was loosely closed with skin staples.  All trocar sites were closed with skin staples.  A honeycomb dressing was placed over the midline wound.  Patient tolerated the procedure well was taken to the recovery in stable condition.    PLAN OF CARE: Admit to inpatient   PATIENT DISPOSITION:  PACU - hemodynamically stable.   Delay start of Pharmacological VTE agent (>24hrs) due to surgical blood loss or risk of bleeding: yes

## 2022-08-23 NOTE — Progress Notes (Signed)
PT Cancellation Note  Patient Details Name: Brendan Butler MRN: [id number] DOB: Aug 07, 1950   Cancelled Treatment:    Reason Eval/Treat Not Completed: (P) Patient declined, no reason specified (Pt reporting nausea and fatigue from surgery earlier. Pt requests that PT return tomorrow.)   Johny Shock 08/23/2022, 2:17 PM

## 2022-08-23 NOTE — Anesthesia Preprocedure Evaluation (Addendum)
Anesthesia Evaluation  Patient identified by MRN, date of birth, ID band Patient awake    Reviewed: Allergy & Precautions, NPO status , Patient's Chart, lab work & pertinent test results  History of Anesthesia Complications Negative for: history of anesthetic complications  Airway Mallampati: II  TM Distance: >3 FB Neck ROM: Full    Dental  (+) Dental Advisory Given, Poor Dentition, Loose,    Pulmonary sleep apnea (noncompliant) , former smoker   Pulmonary exam normal        Cardiovascular hypertension, Pt. on medications + CAD, + Past MI and + Cardiac Stents  Normal cardiovascular exam+ dysrhythmias Atrial Fibrillation    '24 TTE - EF 50%. The left ventricle demonstrates regional wall motion abnormalities (see scoring diagram/findings for description). Trivial mitral valve regurgitation. Aortic valve regurgitation is trivial.     Neuro/Psych  PSYCHIATRIC DISORDERS  Depression    negative neurological ROS     GI/Hepatic ,,,(+)     substance abuse  marijuana use Colon cancer    Endo/Other  negative endocrine ROS    Renal/GU CRFRenal disease     Musculoskeletal  (+) Arthritis ,    Abdominal   Peds  Hematology  (+) Blood dyscrasia, anemia  On xarelto    Anesthesia Other Findings   Reproductive/Obstetrics                              Anesthesia Physical Anesthesia Plan  ASA: 3  Anesthesia Plan: General   Post-op Pain Management: Tylenol PO (pre-op)*   Induction: Intravenous  PONV Risk Score and Plan: 2 and Treatment may vary due to age or medical condition, Ondansetron and Propofol infusion  Airway Management Planned: Oral ETT  Additional Equipment: None  Intra-op Plan:   Post-operative Plan: Extubation in OR  Informed Consent: I have reviewed the patients History and Physical, chart, labs and discussed the procedure including the risks, benefits and alternatives for  the proposed anesthesia with the patient or authorized representative who has indicated his/her understanding and acceptance.     Dental advisory given  Plan Discussed with: CRNA and Anesthesiologist  Anesthesia Plan Comments:         Anesthesia Quick Evaluation

## 2022-08-23 NOTE — Progress Notes (Signed)
Pt transferred to room from PACU. Assessed pts incision site and dressing on abd. This nurse circled 2 areas of new drainage on the honeycomb dressing. Pt placed back on room tele, pt drowsy but easily arousable. Pt resting on 2L Asher O2 sats are 96%

## 2022-08-23 NOTE — Progress Notes (Signed)
Brendan Butler  BMW:[id number] DOB: December 07, 1950 DOA: 08/17/2022 PCP: [name], MD    As per prior documentation done by Dr. [name]: "71/M with history of CAD, multiple PCI and stenting, atrial flutter, depression, hypertension, OSA, CKD 3, bronchiectasis, left nephrectomy presented to the ED with shortness of breath and edema, progressive weight gain and fatigue. -Recently admitted with worsening anemia and positive Hemoccult, transfused PRBC, declined colonoscopy then. -In the ED heart rate 40-50, creatinine 1.9, hemoglobin 7.3, chest x-ray noted mild cardiomegaly. -Admitted, improving with diuretics, blood transfusion -Gastroenterology consulted for iron deficiency anemia, colonoscopy 6/24 noted colon mass, biopsy noted invasive moderate to well-differentiated adeno CA -6/26, surgery consulted"  08/23/2022: Patient underwent open hemicolectomy with ileocolonic anastomosis for right colon mass.  Surgery teams input is highly appreciated.  Patient seems stable postop.  Assessment and Plan:  Colon adenocarcinoma, new diagnosis Iron deficiency anemia -Recent diagnosis, had heme positive stools on admission few weeks ago, declined GI workup then, now amenable -Holding Xarelto [facility] gastroenterology consulted, EGD/colonoscopy 6/25, colon mass noted, biopsy pending -Transfused 1 unit PRBC, given IV iron -Hemoglobin stable, improving -Request surgery evaluation 08/23/2022: Status post right hemicolectomy (open), with ileocolonic anastomosis.  Acute on chronic diastolic CHF -Last echo 6/24 with EF 50%, regional wall motion abnormalities, normal RV -Worsened in the setting of anemia, troponin is normal -Now improving, volume status has improved, he is 5.7 L negative, weight down 13 LB -Changed to oral Lasix, continue Jardiance -CHF TOC follow-up made 08/23/2022: Stable.  AKI on CKD 3a -Likely hemodynamically mediated, compounded by worsening anemia -Holding  Entresto, baseline creatinine around 1.6, now 1.9-2 -Creatinine plateauing around this level 08/23/2022: Baseline serum creatinine is 1.65.  Serum creatinine continues to improve.  Serum creatinine today is 1.96.  Hypokalemia: -Potassium of 3.2 today. -Replete potassium cautiously, considering impaired renal function.  Renal panel in the morning.    Lung Nodule -Concerning for adeno CA, noted on prior imaging, followed by Dr. Delton Coombes, he missed his appointment for PET scan -This will need to be rescheduled, emphasized with patient regarding importance of this workup  Atrial flutter -With bradycardia, currently on amiodarone,  -Heart rate improved, Xarelto held with colon cancer and GI bleed  CAD HLD > Status post multiple stenting > Does notably have an aspirin allergy - Continue  statin,  - Hold Xarelto as above   Depression - Continue home bupropion, paroxetine, Seroquel   Hypertension -Holding Entresto and Aldactone    OSA - Not on CPAP per chart review   DVT prophylaxis: SCDs Code Status: Full code Family Communication: None present Disposition Plan: Home pending ongoing workup  Consultants: Gastroenterology, cards, general surgery  Antimicrobials:    Objective: Vitals:   08/23/22 1145 08/23/22 1153 08/23/22 1200 08/23/22 1224  BP: (!) 196/93 (!) 171/80 (!) 165/79 (!) 143/50  Pulse: 81 71 67 64  Resp: 12 (!) 9 12 10   Temp:   98 F (36.7 C)   TempSrc:      SpO2: 95% 94% 90% 96%  Weight:      Height:        Intake/Output Summary (Last 24 hours) at 08/23/2022 1526 Last data filed at 08/23/2022 1248 Gross per 24 hour  Intake 3580.55 ml  Output 3410 ml  Net 170.55 ml    Filed Weights   08/22/22 0444 08/23/22 0600 08/23/22 0751  Weight: 92.3 kg 93.6 kg 93.4 kg    Examination: General condition: Not in any distress.  Awake and alert.  HEENT: Patient is pale. Neck: Supple. Lungs: Clear to auscultation. CVS: S1-S2. Abdomen: Obese, soft and nontender.   Surgical wound is noted. Neuro: Awake and alert.  Moves all extremities. Extremities: Mild edema of the upper and lower extremities.   Data Reviewed:   CBC: Recent Labs  Lab 08/19/22 0036 08/20/22 0032 08/21/22 0041 08/22/22 0048 08/23/22 0053  WBC 9.8 10.6* 11.9* 11.8* 14.5*  HGB 9.5* 9.2* 9.1* 9.0* 9.8*  HCT 30.7* 30.6* 29.5* 29.2* 32.8*  MCV 81.4 82.0 81.7 82.0 82.2  PLT 267 264 261 253 271    Basic Metabolic Panel: Recent Labs  Lab 08/17/22 1017 08/18/22 0055 08/19/22 0036 08/20/22 0032 08/21/22 0041 08/22/22 0048 08/23/22 0053  NA  --    < > 138 137 138 137 136  K  --    < > 3.8 3.5 3.5 3.5 3.2*  CL  --    < > 101 99 101 101 100  CO2  --    < > 26 26 27 25 25   GLUCOSE  --    < > 92 90 98 102* 93  BUN  --    < > 22 24* 25* 21 19  CREATININE  --    < > 1.81* 1.93* 1.99* 2.00* 1.96*  CALCIUM  --    < > 8.7* 8.4* 8.6* 8.7* 8.9  MG 2.4  --   --   --   --   --   --    < > = values in this interval not displayed.    GFR: Estimated Creatinine Clearance: 40.9 mL/min (A) (by C-G formula based on SCr of 1.96 mg/dL (H)). Liver Function Tests: Recent Labs  Lab 08/17/22 1619 08/18/22 0055  AST 16 17  ALT 15 14  ALKPHOS 73 67  BILITOT 0.7 0.7  PROT 6.6 6.4*  ALBUMIN 3.6 3.4*    No results for input(s): "LIPASE", "AMYLASE" in the last 168 hours. No results for input(s): "AMMONIA" in the last 168 hours. Coagulation Profile: No results for input(s): "INR", "PROTIME" in the last 168 hours. Cardiac Enzymes: No results for input(s): "CKTOTAL", "CKMB", "CKMBINDEX", "TROPONINI" in the last 168 hours. BNP (last 3 results) No results for input(s): "PROBNP" in the last 8760 hours. HbA1C: No results for input(s): "HGBA1C" in the last 72 hours. CBG: No results for input(s): "GLUCAP" in the last 168 hours. Lipid Profile: No results for input(s): "CHOL", "HDL", "LDLCALC", "TRIG", "CHOLHDL", "LDLDIRECT" in the last 72 hours. Thyroid Function Tests: No results for  input(s): "TSH", "T4TOTAL", "FREET4", "T3FREE", "THYROIDAB" in the last 72 hours.  Anemia Panel: No results for input(s): "VITAMINB12", "FOLATE", "FERRITIN", "TIBC", "IRON", "RETICCTPCT" in the last 72 hours.  Urine analysis:    Component Value Date/Time   COLORURINE AMBER (A) 05/20/2022 2038   APPEARANCEUR CLEAR 05/20/2022 2038   LABSPEC 1.034 (H) 05/20/2022 2038   PHURINE 5.0 05/20/2022 2038   GLUCOSEU NEGATIVE 05/20/2022 2038   HGBUR NEGATIVE 05/20/2022 2038   BILIRUBINUR NEGATIVE 05/20/2022 2038   KETONESUR NEGATIVE 05/20/2022 2038   PROTEINUR NEGATIVE 05/20/2022 2038   NITRITE NEGATIVE 05/20/2022 2038   LEUKOCYTESUR NEGATIVE 05/20/2022 2038   Sepsis Labs: @LABRCNTIP (procalcitonin:4,lacticidven:4)  ) Recent Results (from the past 240 hour(s))  Surgical pcr screen     Status: None   Collection Time: 08/22/22  9:15 PM   Specimen: Nasal Mucosa; Nasal Swab  Result Value Ref Range Status   MRSA, PCR NEGATIVE NEGATIVE Final   Staphylococcus aureus NEGATIVE NEGATIVE Final    Comment: (  NOTE) The Xpert SA Assay (FDA approved for NASAL specimens in patients 59 years of age and older), is one component of a comprehensive surveillance program. It is not intended to diagnose infection nor to guide or monitor treatment. Performed at [facility], [address], [place], [place] [address]      Radiology Studies: CT CHEST ABDOMEN PELVIS WO CONTRAST  Result Date: 08/22/2022 CLINICAL DATA:  Colon cancer staging.  * Tracking Code: BO * EXAM: CT CHEST, ABDOMEN AND PELVIS WITHOUT CONTRAST TECHNIQUE: Multidetector CT imaging of the chest, abdomen and pelvis was performed following the standard protocol without IV contrast. RADIATION DOSE REDUCTION: This exam was performed according to the departmental dose-optimization program which includes automated exposure control, adjustment of the mA and/or kV according to patient size and/or use of iterative reconstruction technique.  COMPARISON:  CT 05/20/2022 chest abdomen pelvis. FINDINGS: CT CHEST FINDINGS Cardiovascular: Heart is nonenlarged. No pericardial effusion. Coronary artery calcifications are seen. The thoracic aorta on this non IV contrast exam has a normal course and caliber with scattered vascular calcifications. Mediastinum/Nodes: No specific abnormal lymph node enlargement identified in the axillary regions, hilum or mediastinum. A few small less than 1 cm in size in short axis lymph nodes are seen, nonpathologic by size criteria. Slightly patulous thoracic esophagus with small hiatal hernia. Preserved thyroid gland. Lungs/Pleura: Bilateral small upper lobe air cysts identified. There are some dependent atelectasis with some scarring and subtle ground-glass. There is metallic marker in the right lower lobe on series 4, image 106. Minimal areas of ground-glass in the upper lobes as well. No pneumothorax, effusion or consolidation. The tiny right effusion is no longer identified. No new discrete dominant lung nodule. Musculoskeletal: Some interval healing of the previously seen posterior right lower rib fractures. Pins along the left shoulder. Degenerative changes of the spine. There is moderate compression deformity of L1 along the superior endplate with some kyphosis and sclerosis. This is unchanged from the study of March 2024. CT ABDOMEN PELVIS FINDINGS Hepatobiliary: Evaluation for solid organ pathology including metastatic disease is limited without the advantage of IV contrast. Liver is grossly preserved. Previous cholecystectomy. Pancreas: Unremarkable. No pancreatic ductal dilatation or surrounding inflammatory changes. Spleen: Normal in size without focal abnormality. Adrenals/Urinary Tract: Adrenal glands are preserved. Parapelvic lower pole right-sided renal cysts. No right renal or ureteral stone. No collecting system dilatation on the right. Left kidney is surgically absent with surgical clips in the nephrectomy  bed. Bladder is underdistended. Stomach/Bowel: Oral contrast was administered. Normal appendix in the right lower quadrant. Large bowel has a normal course and caliber with scattered stool and diverticula. There is some debris in the stomach. Small bowel is nondilated. Vascular/Lymphatic: Aortic atherosclerosis. No enlarged abdominal or pelvic lymph nodes. Reproductive: Prostate is unremarkable. Other: No free air or free fluid. Musculoskeletal: Scattered degenerative changes of the spine and pelvis. There are some disc bulging and stenosis identified along the lower lumbar spine at L4-5. IMPRESSION: Surgical changes of prior left nephrectomy. No recurrent mass lesion, fluid collection or lymph node enlargement in the chest, abdomen or pelvis. Improving right tiny pleural effusion. Colonic diverticula.  No bowel obstruction. Stable compression of L1.  Healing right-sided rib fractures. Evaluation for malignancy and solid organ pathology is limited without the advantage of IV contrast Electronically Signed   By: Karen Kays M.D.   On: 08/22/2022 16:04     Scheduled Meds:  sodium chloride   Intravenous Once   acetaminophen  1,000 mg Oral Q6H  amiodarone  100 mg Oral Daily   buPROPion  150 mg Oral Daily   empagliflozin  10 mg Oral Daily   furosemide  40 mg Oral BID   pantoprazole  40 mg Oral Daily   PARoxetine  30 mg Oral Daily   potassium chloride  40 mEq Oral Q4H   pregabalin  150 mg Oral TID   QUEtiapine  25 mg Oral QHS   rosuvastatin  20 mg Oral Daily   sodium chloride flush  3 mL Intravenous Q12H   Continuous Infusions:  sodium chloride 10 mL/hr at 08/23/22 1248   promethazine (PHENERGAN) injection (IM or IVPB) 200 mL/hr at 08/23/22 1248     LOS: 5 days    Time spent:    Berton Mount, MD.   [facility]   08/23/2022, 3:26 PM

## 2022-08-23 NOTE — Progress Notes (Signed)
Heart Failure Stewardship Pharmacist Progress Note   PCP: [name], MD PCP-Cardiologist: Charlton Haws, MD    HPI:  72 yo M with PMH of chronic HFrEF with EF as low as 35-40% in 12/2012 with normalization to 55-60% in 11/2021 but then down again to 20-25% on TEE in 04/2022, CAD, GIB, bradycardia, aflutter, HTN, HLD, OSA, CKD IIIa, and prior left nephrectomy.  Admitted in 12/2012 with STEMI s/p PCI to mild LAD. Readmitted 1 week later with in-stent restenosis complicated by VF on cath table, thought to be due to Brilinta failure vs noncompliance. LVEF was 35-40%.   Admitted in 04/2022 with altered mental status. Noncompliance to medications. Found to have atrial flutter and decline of LVEF. Underwent successful TEE/DCCV on 3/14. He left AMA.   Readmitted later that month with aflutter RVR, AMS, fall, and rib fractures. He was started on IV amiodarone and metoprolol. Amiodarone was later stopped due to concern for compliance. He was noted to have some pauses on telemetry so a live Zio monitor was placed and plan was for discharge to SNF on 05/24/2022; however, while waiting for transportation, he was noted to have a 4 second pause followed by a 5.8 second pause. Discharge was cancelled and BB was stopped. EP was consulted and he was restarted on IV amiodarone and then underwent repeat TEE/DCCV on 05/28/2022 with restoration of sinus rhythm. EP recommended avoiding permament pacemaker if at all possible given compliance issues.   Admitted 07/2022 with acute blood loss anemia requiring PRBC. GI recommended colonoscopy but patient declined, treated with IV iron. He did not follow up as outpatient.   Presented to the ED on 6/21 with shortness of breath, fatigue, weight gain, and LE edema despite taking increased lasix at home after speaking with outpatient cardiology on 6/18. CXR with mild cardiac enlargement. ECHO 6/22 showed LVEF 50%, regional wall motion abnormalities, RV normal.   Current HF  Medications: Diuretic: furosemide 40 mg PO BID SGLT2i: Jardiance 10 mg daily  Prior to admission HF Medications: Diuretic: furosemide 40 mg daily ACE/ARB/ARNI: Entresto 24/26 mg BID MRA: spironolactone 12.5 mg daily SGLT2i: Jardiance 10 mg daily  Pertinent Lab Values: Serum creatinine 1.96, BUN 19, Potassium 3.2, Sodium 136, BNP 26.9>41.5, Magnesium 2.4 TSAT 3, Ferritin 17  Vital Signs: Weight: 206 lbs (admission weight: 216 lbs) Blood pressure: 140-170/50s  Heart rate: 60s  I/O: -2L yesterday; net -5.9L  Medication Assistance / Insurance Benefits Check: Does the patient have prescription insurance?  Yes Type of insurance plan: [employer]  Outpatient Pharmacy:  Prior to admission outpatient pharmacy: [facility], mail order Is the patient willing to use [facility] TOC pharmacy at discharge? Yes Is the patient willing to transition their outpatient pharmacy to utilize a [facility] outpatient pharmacy?   No    Assessment: 1. Acute on chronic HFimpEF (LVEF 50%), due to ICM. NYHA class II symptoms. - Continue furosemide 40 mg PO BID. Strict I/Os and daily weights. Keep K>4 and Mg>2. Consider replacing with KCl 40 mEq x 2.  - No BB with bradycardia - Consider restarting Entresto 24/26 mg BID or spironolactone prior to discharge vs as outpatient pending renal function improvement. Baseline creatinine ~1.6 - Continue Jardiance 10 mg daily - Iron stores low. S/p Ferrlecit 250 mg IV x 4 doses.   Plan: 1) Medication changes recommended at this time: - KCl 40 mEq x 2 - Restart Entresto 24/26 mg BID or spironolactone prior to discharge vs as outpatient if creatinine improves  2) Patient assistance: -  None pending - patient states all copays are affordable for him. His daughter helps with medications at home and sets them up in pill packs for him.  3)  Education  - Patient has been educated on current HF medications and potential additions to HF medication regimen - Patient verbalizes  understanding that over the next few months, these medication doses may change and more medications may be added to optimize HF regimen - Patient has been educated on basic disease state pathophysiology and goals of therapy   Sharen Hones, PharmD, BCPS Heart Failure Stewardship Pharmacist Phone [phone]

## 2022-08-23 NOTE — Transfer of Care (Signed)
Immediate Anesthesia Transfer of Care Note  Patient: Brendan Butler  Procedure(s) Performed: LAPAROSCOPIC CONVERTED TO OPEN HEMICOLECTOMY (Right)  Patient Location: PACU  Anesthesia Type:General  Level of Consciousness: awake, alert , and oriented  Airway & Oxygen Therapy: Patient Spontanous Breathing  Post-op Assessment: Report given to RN and Post -op Vital signs reviewed and stable  Post vital signs: Reviewed and stable  Last Vitals:  Vitals Value Taken Time  BP 188/79 08/23/22 1130  Temp    Pulse 84 08/23/22 1134  Resp 12 08/23/22 1134  SpO2 94 % 08/23/22 1134  Vitals shown include unvalidated device data.  Last Pain:  Vitals:   08/23/22 0806  TempSrc:   PainSc: 0-No pain         Complications: No notable events documented.

## 2022-08-23 NOTE — Anesthesia Procedure Notes (Signed)
Procedure Name: Intubation Date/Time: 08/23/2022 9:18 AM  Performed by: Sheppard Evens, CRNAPre-anesthesia Checklist: Patient identified, Emergency Drugs available, Suction available and Patient being monitored Patient Re-evaluated:Patient Re-evaluated prior to induction Oxygen Delivery Method: Circle System Utilized Preoxygenation: Pre-oxygenation with 100% oxygen Induction Type: IV induction Ventilation: Mask ventilation without difficulty Laryngoscope Size: Mac and 3 Grade View: Grade II Tube type: Oral Tube size: 7.5 mm Number of attempts: 1 Airway Equipment and Method: Stylet and Oral airway Placement Confirmation: ETT inserted through vocal cords under direct vision, positive ETCO2 and breath sounds checked- equal and bilateral Secured at: 22 cm Tube secured with: Tape Dental Injury: Teeth and Oropharynx as per pre-operative assessment

## 2022-08-24 ENCOUNTER — Encounter (HOSPITAL_COMMUNITY): Payer: Self-pay | Admitting: General Surgery

## 2022-08-24 DIAGNOSIS — D649 Anemia, unspecified: Secondary | ICD-10-CM | POA: Diagnosis not present

## 2022-08-24 LAB — RENAL FUNCTION PANEL
Albumin: 3.1 g/dL — ABNORMAL LOW (ref 3.5–5.0)
Anion gap: 9 (ref 5–15)
BUN: 17 mg/dL (ref 8–23)
CO2: 26 mmol/L (ref 22–32)
Calcium: 8.4 mg/dL — ABNORMAL LOW (ref 8.9–10.3)
Chloride: 105 mmol/L (ref 98–111)
Creatinine, Ser: 1.77 mg/dL — ABNORMAL HIGH (ref 0.61–1.24)
GFR, Estimated: 41 mL/min — ABNORMAL LOW (ref >60)
Glucose, Bld: 115 mg/dL — ABNORMAL HIGH (ref 70–99)
Phosphorus: 3.9 mg/dL (ref 2.5–4.6)
Potassium: 4.5 mmol/L (ref 3.5–5.1)
Sodium: 140 mmol/L (ref 135–145)

## 2022-08-24 LAB — CBC
HCT: 31.8 % — ABNORMAL LOW (ref 39.0–52.0)
Hemoglobin: 9.7 g/dL — ABNORMAL LOW (ref 13.0–17.0)
MCH: 25.3 pg — ABNORMAL LOW (ref 26.0–34.0)
MCHC: 30.5 g/dL (ref 30.0–36.0)
MCV: 83 fL (ref 80.0–100.0)
Platelets: 202 10*3/uL (ref 150–400)
RBC: 3.83 MIL/uL — ABNORMAL LOW (ref 4.22–5.81)
RDW: 18.1 % — ABNORMAL HIGH (ref 11.5–15.5)
WBC: 14.3 10*3/uL — ABNORMAL HIGH (ref 4.0–10.5)
nRBC: 0 % (ref 0.0–0.2)

## 2022-08-24 LAB — MAGNESIUM: Magnesium: 2 mg/dL (ref 1.7–2.4)

## 2022-08-24 MED ORDER — BOOST / RESOURCE BREEZE PO LIQD CUSTOM
1.0000 | Freq: Three times a day (TID) | ORAL | Status: DC
  Administered 2022-08-24 – 2022-08-26 (×5): 1 via ORAL

## 2022-08-24 MED ORDER — ADULT MULTIVITAMIN W/MINERALS CH
1.0000 | ORAL_TABLET | Freq: Every day | ORAL | Status: DC
  Administered 2022-08-24 – 2022-08-28 (×5): 1 via ORAL
  Filled 2022-08-24 (×5): qty 1

## 2022-08-24 MED ORDER — PROSOURCE PLUS PO LIQD
30.0000 mL | Freq: Three times a day (TID) | ORAL | Status: DC
  Administered 2022-08-24 – 2022-08-28 (×10): 30 mL via ORAL
  Filled 2022-08-24 (×12): qty 30

## 2022-08-24 NOTE — Anesthesia Postprocedure Evaluation (Signed)
Anesthesia Post Note  Patient: Brendan Butler  Procedure(s) Performed: LAPAROSCOPIC CONVERTED TO OPEN HEMICOLECTOMY (Right)     Patient location during evaluation: PACU Anesthesia Type: General Level of consciousness: awake and alert Pain management: pain level controlled Vital Signs Assessment: post-procedure vital signs reviewed and stable Respiratory status: spontaneous breathing, nonlabored ventilation, respiratory function stable and patient connected to nasal cannula oxygen Cardiovascular status: blood pressure returned to baseline and stable Postop Assessment: no apparent nausea or vomiting Anesthetic complications: no   No notable events documented.  Last Vitals:  Vitals:   08/24/22 0001 08/24/22 0439  BP: (!) 152/50 (!) 133/52  Pulse: 63 61  Resp: 19 19  Temp: 36.7 C 36.7 C  SpO2: 95% 97%    Last Pain:  Vitals:   08/24/22 0439  TempSrc: Oral  PainSc:                  Beryle Lathe

## 2022-08-24 NOTE — Progress Notes (Signed)
PROGRESS NOTE    Brendan Butler  JXB:[id number] DOB: [birth year] DOA: 08/17/2022 PCP: [name], MD    As per prior documentation done by Dr. [name]: "72/M with history of CAD, multiple PCI and stenting, atrial flutter, depression, hypertension, OSA, CKD 3, bronchiectasis, left nephrectomy presented to the ED with shortness of breath and edema, progressive weight gain and fatigue. -Recently admitted with worsening anemia and positive Hemoccult, transfused PRBC, declined colonoscopy then. -In the ED heart rate 40-50, creatinine 1.9, hemoglobin 7.3, chest x-ray noted mild cardiomegaly. -Admitted, improving with diuretics, blood transfusion -Gastroenterology consulted for iron deficiency anemia, colonoscopy 6/24 noted colon mass, biopsy noted invasive moderate to well-differentiated adeno CA -6/26, surgery consulted"  08/23/2022: Patient underwent open hemicolectomy with ileocolonic anastomosis for right colon mass.  Surgery teams input is highly appreciated.  Patient seems stable postop.  08/24/2022: Patient seen.  Post op day 1.  Surgical team is directing care.  No new complaints today.  Assessment and Plan:  Colon adenocarcinoma, new diagnosis Iron deficiency anemia -Recent diagnosis, had heme positive stools on admission few weeks ago, declined GI workup then, now amenable -Holding Xarelto [facility] gastroenterology consulted, EGD/colonoscopy 6/25, colon mass noted, biopsy pending -Transfused 1 unit PRBC, given IV iron -Hemoglobin stable, improving -Request surgery evaluation 08/23/2022: Status post right hemicolectomy (open), with ileocolonic anastomosis.  Acute on chronic diastolic CHF -Last echo 6/24 with EF 50%, regional wall motion abnormalities, normal RV -Worsened in the setting of anemia, troponin is normal -Now improving, volume status has improved, he is 5.7 L negative, weight down 13 LB -Changed to oral Lasix, continue Jardiance -CHF TOC follow-up made 08/24/2022:  Stable.  AKI on CKD 3a -Likely hemodynamically mediated, compounded by worsening anemia -Holding Entresto, baseline creatinine around 1.6, now 1.9-2 -Creatinine plateauing around this level 08/23/2022: Baseline serum creatinine is 1.65.  Serum creatinine continues to improve.  Serum creatinine today is 1.96. 08/24/2022: Serum creatinine today is down to 1.77.  Hypokalemia: -Potassium of 3.2 today. -Replete potassium cautiously, considering impaired renal function.  Renal panel in the morning.   08/24/2022: Resolved.  Potassium is 4.5 today.  Lung Nodule -Concerning for adeno CA, noted on prior imaging, followed by Dr. Delton Coombes, he missed his appointment for PET scan -This will need to be rescheduled, emphasized with patient regarding importance of this workup  Atrial flutter -With bradycardia, currently on amiodarone,  -Heart rate improved, Xarelto held with colon cancer and GI bleed  CAD HLD > Status post multiple stenting > Does notably have an aspirin allergy - Continue  statin,  - Hold Xarelto as above   Depression - Continue home bupropion, paroxetine, Seroquel   Hypertension -Holding Entresto and Aldactone    OSA - Not on CPAP per chart review   DVT prophylaxis: SCDs Code Status: Full code Family Communication: None present Disposition Plan: Home pending ongoing workup  Consultants: Gastroenterology, cards, general surgery  Antimicrobials:    Objective: Vitals:   08/23/22 1902 08/24/22 0001 08/24/22 0439 08/24/22 0907  BP: (!) 143/53 (!) 152/50 (!) 133/52   Pulse: 60 63 61 68  Resp: 20 19 19    Temp: 98.3 F (36.8 C) 98 F (36.7 C) 98.1 F (36.7 C) 98.2 F (36.8 C)  TempSrc: Oral Oral Oral Oral  SpO2: 98% 95% 97%   Weight:   93.4 kg   Height:        Intake/Output Summary (Last 24 hours) at 08/24/2022 1057 Last data filed at 08/24/2022 0900 Gross per 24 hour  Intake 2238.55 ml  Output 1550 ml  Net 688.55 ml    Filed Weights   08/23/22 0600  08/23/22 0751 08/24/22 0439  Weight: 93.6 kg 93.4 kg 93.4 kg    Examination: General condition: Not in any distress.  Awake and alert. HEENT: Patient is pale. Neck: Supple. Lungs: Clear to auscultation. CVS: S1-S2. Abdomen: Obese, soft and nontender.  Surgical wound is noted. Neuro: Awake and alert.  Moves all extremities. Extremities: Mild edema of the upper and lower extremities.   Data Reviewed:   CBC: Recent Labs  Lab 08/20/22 0032 08/21/22 0041 08/22/22 0048 08/23/22 0053 08/24/22 0024  WBC 10.6* 11.9* 11.8* 14.5* 14.3*  HGB 9.2* 9.1* 9.0* 9.8* 9.7*  HCT 30.6* 29.5* 29.2* 32.8* 31.8*  MCV 82.0 81.7 82.0 82.2 83.0  PLT 264 261 253 271 202    Basic Metabolic Panel: Recent Labs  Lab 08/20/22 0032 08/21/22 0041 08/22/22 0048 08/23/22 0053 08/24/22 0025  NA 137 138 137 136 140  K 3.5 3.5 3.5 3.2* 4.5  CL 99 101 101 100 105  CO2 26 27 25 25 26   GLUCOSE 90 98 102* 93 115*  BUN 24* 25* 21 19 17   CREATININE 1.93* 1.99* 2.00* 1.96* 1.77*  CALCIUM 8.4* 8.6* 8.7* 8.9 8.4*  MG  --   --   --   --  2.0  PHOS  --   --   --   --  3.9    GFR: Estimated Creatinine Clearance: 45.2 mL/min (A) (by C-G formula based on SCr of 1.77 mg/dL (H)). Liver Function Tests: Recent Labs  Lab 08/17/22 1619 08/18/22 0055 08/24/22 0025  AST 16 17  --   ALT 15 14  --   ALKPHOS 73 67  --   BILITOT 0.7 0.7  --   PROT 6.6 6.4*  --   ALBUMIN 3.6 3.4* 3.1*    No results for input(s): "LIPASE", "AMYLASE" in the last 168 hours. No results for input(s): "AMMONIA" in the last 168 hours. Coagulation Profile: No results for input(s): "INR", "PROTIME" in the last 168 hours. Cardiac Enzymes: No results for input(s): "CKTOTAL", "CKMB", "CKMBINDEX", "TROPONINI" in the last 168 hours. BNP (last 3 results) No results for input(s): "PROBNP" in the last 8760 hours. HbA1C: No results for input(s): "HGBA1C" in the last 72 hours. CBG: No results for input(s): "GLUCAP" in the last 168  hours. Lipid Profile: No results for input(s): "CHOL", "HDL", "LDLCALC", "TRIG", "CHOLHDL", "LDLDIRECT" in the last 72 hours. Thyroid Function Tests: No results for input(s): "TSH", "T4TOTAL", "FREET4", "T3FREE", "THYROIDAB" in the last 72 hours.  Anemia Panel: No results for input(s): "VITAMINB12", "FOLATE", "FERRITIN", "TIBC", "IRON", "RETICCTPCT" in the last 72 hours.  Urine analysis:    Component Value Date/Time   COLORURINE AMBER (A) 05/20/2022 2038   APPEARANCEUR CLEAR 05/20/2022 2038   LABSPEC 1.034 (H) 05/20/2022 2038   PHURINE 5.0 05/20/2022 2038   GLUCOSEU NEGATIVE 05/20/2022 2038   HGBUR NEGATIVE 05/20/2022 2038   BILIRUBINUR NEGATIVE 05/20/2022 2038   KETONESUR NEGATIVE 05/20/2022 2038   PROTEINUR NEGATIVE 05/20/2022 2038   NITRITE NEGATIVE 05/20/2022 2038   LEUKOCYTESUR NEGATIVE 05/20/2022 2038   Sepsis Labs: @LABRCNTIP (procalcitonin:4,lacticidven:4)  ) Recent Results (from the past 240 hour(s))  Surgical pcr screen     Status: None   Collection Time: 08/22/22  9:15 PM   Specimen: Nasal Mucosa; Nasal Swab  Result Value Ref Range Status   MRSA, PCR NEGATIVE NEGATIVE Final   Staphylococcus aureus NEGATIVE NEGATIVE Final    Comment: (NOTE) The  Xpert SA Assay (FDA approved for NASAL specimens in patients 75 years of age and older), is one component of a comprehensive surveillance program. It is not intended to diagnose infection nor to guide or monitor treatment. Performed at [facility]: [facility], [address], [place], [place] [address]       Radiology Studies: CT CHEST ABDOMEN PELVIS WO CONTRAST  Result Date: 08/22/2022 CLINICAL DATA:  Colon cancer staging.  * Tracking Code: BO * EXAM: CT CHEST, ABDOMEN AND PELVIS WITHOUT CONTRAST TECHNIQUE: Multidetector CT imaging of the chest, abdomen and pelvis was performed following the standard protocol without IV contrast. RADIATION DOSE REDUCTION: This exam was performed according to the departmental  dose-optimization program which includes automated exposure control, adjustment of the mA and/or kV according to patient size and/or use of iterative reconstruction technique. COMPARISON:  CT 05/20/2022 chest abdomen pelvis. FINDINGS: CT CHEST FINDINGS Cardiovascular: Heart is nonenlarged. No pericardial effusion. Coronary artery calcifications are seen. The thoracic aorta on this non IV contrast exam has a normal course and caliber with scattered vascular calcifications. Mediastinum/Nodes: No specific abnormal lymph node enlargement identified in the axillary regions, hilum or mediastinum. A few small less than 1 cm in size in short axis lymph nodes are seen, nonpathologic by size criteria. Slightly patulous thoracic esophagus with small hiatal hernia. Preserved thyroid gland. Lungs/Pleura: Bilateral small upper lobe air cysts identified. There are some dependent atelectasis with some scarring and subtle ground-glass. There is metallic marker in the right lower lobe on series 4, image 106. Minimal areas of ground-glass in the upper lobes as well. No pneumothorax, effusion or consolidation. The tiny right effusion is no longer identified. No new discrete dominant lung nodule. Musculoskeletal: Some interval healing of the previously seen posterior right lower rib fractures. Pins along the left shoulder. Degenerative changes of the spine. There is moderate compression deformity of L1 along the superior endplate with some kyphosis and sclerosis. This is unchanged from the study of March 2024. CT ABDOMEN PELVIS FINDINGS Hepatobiliary: Evaluation for solid organ pathology including metastatic disease is limited without the advantage of IV contrast. Liver is grossly preserved. Previous cholecystectomy. Pancreas: Unremarkable. No pancreatic ductal dilatation or surrounding inflammatory changes. Spleen: Normal in size without focal abnormality. Adrenals/Urinary Tract: Adrenal glands are preserved. Parapelvic lower pole  right-sided renal cysts. No right renal or ureteral stone. No collecting system dilatation on the right. Left kidney is surgically absent with surgical clips in the nephrectomy bed. Bladder is underdistended. Stomach/Bowel: Oral contrast was administered. Normal appendix in the right lower quadrant. Large bowel has a normal course and caliber with scattered stool and diverticula. There is some debris in the stomach. Small bowel is nondilated. Vascular/Lymphatic: Aortic atherosclerosis. No enlarged abdominal or pelvic lymph nodes. Reproductive: Prostate is unremarkable. Other: No free air or free fluid. Musculoskeletal: Scattered degenerative changes of the spine and pelvis. There are some disc bulging and stenosis identified along the lower lumbar spine at L4-5. IMPRESSION: Surgical changes of prior left nephrectomy. No recurrent mass lesion, fluid collection or lymph node enlargement in the chest, abdomen or pelvis. Improving right tiny pleural effusion. Colonic diverticula.  No bowel obstruction. Stable compression of L1.  Healing right-sided rib fractures. Evaluation for malignancy and solid organ pathology is limited without the advantage of IV contrast Electronically Signed   By: Karen Kays M.D.   On: 08/22/2022 16:04     Scheduled Meds:  sodium chloride   Intravenous Once   acetaminophen  1,000 mg Oral Q6H  amiodarone  100 mg Oral Daily   buPROPion  150 mg Oral Daily   empagliflozin  10 mg Oral Daily   furosemide  40 mg Oral BID   pantoprazole  40 mg Oral Daily   PARoxetine  30 mg Oral Daily   pregabalin  150 mg Oral TID   QUEtiapine  25 mg Oral QHS   rosuvastatin  20 mg Oral Daily   sodium chloride flush  3 mL Intravenous Q12H   Continuous Infusions:  sodium chloride 10 mL/hr (08/23/22 1810)   promethazine (PHENERGAN) injection (IM or IVPB) 200 mL/hr at 08/23/22 1248     LOS: 6 days    Time spent:    Berton Mount, MD.   [facility]   08/24/2022, 10:57 AM

## 2022-08-24 NOTE — Progress Notes (Signed)
1 Day Post-Op   Subjective/Chief Complaint: PT doing well this AM Tol po    Objective: Vital signs in last 24 hours: Temp:  [98 F (36.7 C)-98.3 F (36.8 C)] 98.1 F (36.7 C) (06/28 0439) Pulse Rate:  [58-82] 61 (06/28 0439) Resp:  [9-20] 19 (06/28 0439) BP: (132-196)/(43-93) 133/52 (06/28 0439) SpO2:  [90 %-98 %] 97 % (06/28 0439) Weight:  [93.4 kg] 93.4 kg (06/28 0439) Last BM Date : 08/22/22  Intake/Output from previous day: 06/27 0701 - 06/28 0700 In: 2738.6 [I.V.:2632; IV Piggyback:106.6] Out: 1950 [Urine:1750; Blood:200] Intake/Output this shift: No intake/output data recorded.  General appearance: alert and cooperative GI: soft, approp ttp, inc c/d/i  Lab Results:  Recent Labs    08/23/22 0053 08/24/22 0024  WBC 14.5* 14.3*  HGB 9.8* 9.7*  HCT 32.8* 31.8*  PLT 271 202   BMET Recent Labs    08/23/22 0053 08/24/22 0025  NA 136 140  K 3.2* 4.5  CL 100 105  CO2 25 26  GLUCOSE 93 115*  BUN 19 17  CREATININE 1.96* 1.77*  CALCIUM 8.9 8.4*   Studies/Results: CT CHEST ABDOMEN PELVIS WO CONTRAST  Result Date: 08/22/2022 CLINICAL DATA:  Colon cancer staging.  * Tracking Code: BO * EXAM: CT CHEST, ABDOMEN AND PELVIS WITHOUT CONTRAST TECHNIQUE: Multidetector CT imaging of the chest, abdomen and pelvis was performed following the standard protocol without IV contrast. RADIATION DOSE REDUCTION: This exam was performed according to the departmental dose-optimization program which includes automated exposure control, adjustment of the mA and/or kV according to patient size and/or use of iterative reconstruction technique. COMPARISON:  CT 05/20/2022 chest abdomen pelvis. FINDINGS: CT CHEST FINDINGS Cardiovascular: Heart is nonenlarged. No pericardial effusion. Coronary artery calcifications are seen. The thoracic aorta on this non IV contrast exam has a normal course and caliber with scattered vascular calcifications. Mediastinum/Nodes: No specific abnormal lymph node  enlargement identified in the axillary regions, hilum or mediastinum. A few small less than 1 cm in size in short axis lymph nodes are seen, nonpathologic by size criteria. Slightly patulous thoracic esophagus with small hiatal hernia. Preserved thyroid gland. Lungs/Pleura: Bilateral small upper lobe air cysts identified. There are some dependent atelectasis with some scarring and subtle ground-glass. There is metallic marker in the right lower lobe on series 4, image 106. Minimal areas of ground-glass in the upper lobes as well. No pneumothorax, effusion or consolidation. The tiny right effusion is no longer identified. No new discrete dominant lung nodule. Musculoskeletal: Some interval healing of the previously seen posterior right lower rib fractures. Pins along the left shoulder. Degenerative changes of the spine. There is moderate compression deformity of L1 along the superior endplate with some kyphosis and sclerosis. This is unchanged from the study of March 2024. CT ABDOMEN PELVIS FINDINGS Hepatobiliary: Evaluation for solid organ pathology including metastatic disease is limited without the advantage of IV contrast. Liver is grossly preserved. Previous cholecystectomy. Pancreas: Unremarkable. No pancreatic ductal dilatation or surrounding inflammatory changes. Spleen: Normal in size without focal abnormality. Adrenals/Urinary Tract: Adrenal glands are preserved. Parapelvic lower pole right-sided renal cysts. No right renal or ureteral stone. No collecting system dilatation on the right. Left kidney is surgically absent with surgical clips in the nephrectomy bed. Bladder is underdistended. Stomach/Bowel: Oral contrast was administered. Normal appendix in the right lower quadrant. Large bowel has a normal course and caliber with scattered stool and diverticula. There is some debris in the stomach. Small bowel is nondilated. Vascular/Lymphatic: Aortic atherosclerosis. No enlarged  abdominal or pelvic lymph  nodes. Reproductive: Prostate is unremarkable. Other: No free air or free fluid. Musculoskeletal: Scattered degenerative changes of the spine and pelvis. There are some disc bulging and stenosis identified along the lower lumbar spine at L4-5. IMPRESSION: Surgical changes of prior left nephrectomy. No recurrent mass lesion, fluid collection or lymph node enlargement in the chest, abdomen or pelvis. Improving right tiny pleural effusion. Colonic diverticula.  No bowel obstruction. Stable compression of L1.  Healing right-sided rib fractures. Evaluation for malignancy and solid organ pathology is limited without the advantage of IV contrast Electronically Signed   By: Karen Kays M.D.   On: 08/22/2022 16:04    Anti-infectives: Anti-infectives (From admission, onward)    Start     Dose/Rate Route Frequency Ordered Stop   08/23/22 0700  cefoTEtan (CEFOTAN) 2 g in sodium chloride 0.9 % 100 mL IVPB        2 g 200 mL/hr over 30 Minutes Intravenous On call to O.R. 08/22/22 2158 08/23/22 0955   08/22/22 2215  neomycin (MYCIFRADIN) tablet 1,000 mg       See Hyperspace for full Linked Orders Report.   1,000 mg Oral Every 2 hours 08/22/22 2158 08/23/22 0405   08/22/22 2215  metroNIDAZOLE (FLAGYL) tablet 1,000 mg       See Hyperspace for full Linked Orders Report.   1,000 mg Oral Every 2 hours 08/22/22 2158 08/23/22 0204       Assessment/Plan: s/p Procedure(s): LAPAROSCOPIC CONVERTED TO OPEN HEMICOLECTOMY (Right) POD 1 -con't liquids for now -entereg -mobilize -await bowel fxn -awaiting path   LOS: 6 days    Axel Filler 08/24/2022

## 2022-08-24 NOTE — Progress Notes (Signed)
Initial Nutrition Assessment  DOCUMENTATION CODES:   Not applicable  INTERVENTION:  Boost Breeze po TID, each supplement provides 250 kcal and 9 grams of protein 30 ml ProSource Plus TID, each supplement provides 100 kcals and 15 grams protein.  MVI with minerals daily Monitor for ability to advance diet versus need for supplemental nutrition   NUTRITION DIAGNOSIS:   Increased nutrient needs related to acute illness, cancer and cancer related treatments as evidenced by estimated needs.  GOAL:   Patient will meet greater than or equal to 90% of their needs  MONITOR:   PO intake, Supplement acceptance, Diet advancement, Labs, Weight trends  REASON FOR ASSESSMENT:   NPO/Clear Liquid Diet    ASSESSMENT:   Pt admitted with SOB, edema, progressive weight gain and fatigue. Work up with findings of symptomatic anemia. PMH significant for CAD, multiple PCI and stenting, aflutter, depression, HTN, CKD 3, bronchiectasis, L nephrectomy.  6/24 - s/p colonoscopy- findings of colon mass, biopsy noted invasive moderate to well -differentiated adenocarcinoma 6/27 - s/p open hemicolectomy with ileocolonic anastomosis for R colon mass  Per Surgery, continue with liquids, awaiting bowel function.   Spoke with pt at bedside. He reports no significant changes to his intake PTA. He typically eats when/what he wants. He enjoys hotdogs and hamburgers but has tries to switch to eating more chicken and vegetables. At home he was consuming Boost 2-3 times per day. He has previously tried Ensure but does not like these as much.   100% x breakfast and lunch today on clear liquids. He would benefit and is agreeable to protein supplements until diet advanced further and is better able to adequately meet nutrition needs.   Pt noted to have poor dentition. He did not report difficulty chewing but mentions plans to have teeth pulled and being fit for dentures.   Pt states that his weight has been increasing  d/t edema and fluid retention.  Admit measured weight: 98.2 kg  Current weight: 93.4 kg  Edema: mild pitting BLE  Medications: jardiance, lasix 40mg  BID, protonix  Labs: Cr 1.77, GFR 41  UOP: x24 hours I/O's: - since admit  NUTRITION - FOCUSED PHYSICAL EXAM:  Flowsheet Row Most Recent Value  Orbital Region No depletion  Upper Arm Region No depletion  Thoracic and Lumbar Region No depletion  Buccal Region Mild depletion  Temple Region Mild depletion  Clavicle Bone Region No depletion  Clavicle and Acromion Bone Region No depletion  Scapular Bone Region No depletion  Dorsal Hand No depletion  Patellar Region Mild depletion  Anterior Thigh Region No depletion  Posterior Calf Region No depletion  Edema (RD Assessment) None  Hair Reviewed  Eyes Reviewed  Mouth Other (Comment)  [poor dentition]  Skin Reviewed  Nails Reviewed      Diet Order:   Diet Order             Diet clear liquid Fluid consistency: Thin  Diet effective now                   EDUCATION NEEDS:   Education needs have been addressed  Skin:  Skin Assessment: Reviewed RN Assessment (closed abdominal incision)  Last BM:  6/26  Height:   Ht Readings from Last 1 Encounters:  08/23/22 5\' 10"  (1.778 m)    Weight:   Wt Readings from Last 1 Encounters:  08/24/22 93.4 kg   BMI:  Body mass index is 29.54 kg/m.  Estimated Nutritional Needs:   Kcal:  2200-2400  Protein:  120-135g  Fluid:  >/=2L  Drusilla Kanner, RDN, LDN Clinical Nutrition

## 2022-08-24 NOTE — Progress Notes (Signed)
Physical Therapy Treatment Patient Details Name: Brendan Butler MRN: [id number] DOB: 09/07/1950 Today's Date: 08/24/2022   History of Present Illness Brendan Butler is a 72 y.o. male presenting with shortness of breath and edema. Pt with colonic mass and underwent open hemicolectomy on 6/27.  PMH:CAD status post multiple stenting, symptomatic bradycardia, atrial flutter, depression, hypertension, hyperlipidemia, OSA, CKD 3A, bronchiectasis, CHF, status post left nephrectomy, ASA allergy    PT Comments    Pt now s/p hemicolectomy so not moving as well as preop but expect he will make good progress back toward baseline.    Recommendations for follow up therapy are one component of a multi-disciplinary discharge planning process, led by the attending physician.  Recommendations may be updated based on patient status, additional functional criteria and insurance authorization.  Follow Up Recommendations       Assistance Recommended at Discharge Intermittent Supervision/Assistance  Patient can return home with the following     Equipment Recommendations  None recommended by PT    Recommendations for Other Services       Precautions / Restrictions Precautions Precautions: Fall     Mobility  Bed Mobility Overal bed mobility: Needs Assistance Bed Mobility: Rolling, Sidelying to Sit Rolling: Min guard Sidelying to sit: Min assist       General bed mobility comments: Assist to elevate trunk into sitting due to abd pain    Transfers Overall transfer level: Needs assistance Equipment used: None Transfers: Sit to/from Stand Sit to Stand: Min assist           General transfer comment: Assist to stabilize    Ambulation/Gait Ambulation/Gait assistance: Min guard Gait Distance (Feet): 250 Feet Assistive device: Rollator (4 wheels) Gait Pattern/deviations: Step-through pattern, Decreased stride length, Trunk flexed Gait velocity: decr Gait velocity interpretation:  1.31 - 2.62 ft/sec, indicative of limited [employer]chand[employer], retail Details: Assist for safety   Stairs             Wheelchair Mobility    Modified Rankin (Stroke Patients Only)       Balance Overall balance assessment: Mild deficits observed, not formally tested                                          Cognition Arousal/Alertness: Awake/alert Behavior During Therapy: WFL for tasks assessed/performed Overall Cognitive Status: Within Functional Limits for tasks assessed                                          Exercises      General Comments        Pertinent Vitals/Pain Pain Assessment Pain Assessment: 0-10 Pain Score: 8  Pain Location: abdomen Pain Descriptors / Indicators: Sore Pain Intervention(s): Limited activity within patient's tolerance, Repositioned, Patient requesting pain meds-RN notified    Home Living                          Prior Function            PT Goals (current goals can now be found in the care plan section) Progress towards PT goals: Not progressing toward goals - comment (surgery yesterday)    Frequency    Min 1X/week      PT Plan Current  plan remains appropriate    Co-evaluation              AM-PAC PT "6 Clicks" Mobility   Outcome Measure  Help needed turning from your back to your side while in a flat bed without using bedrails?: A Little Help needed moving from lying on your back to sitting on the side of a flat bed without using bedrails?: A Little Help needed moving to and from a bed to a chair (including a wheelchair)?: A Little Help needed standing up from a chair using your arms (e.g., wheelchair or bedside chair)?: A Little Help needed to walk in hospital room?: A Little Help needed climbing 3-5 steps with a railing? : A Little 6 Click Score: 18    End of Session   Activity Tolerance: Patient limited by pain Patient left: in chair;with call  bell/phone within reach Nurse Communication: Mobility status;Patient requests pain meds PT Visit Diagnosis: Unsteadiness on feet (R26.81);Muscle weakness (generalized) (M62.81);Difficulty in walking, not elsewhere classified (R26.2);Pain Pain - part of body:  (abdomen)     Time: 4782-9562 PT Time Calculation (min) (ACUTE ONLY): 18 min  Charges:  $Gait Training: 8-22 mins                     [facility] PT Acute Rehabilitation Services Office [phone]    Angelina Ok [facility] 08/24/2022, 12:28 PM

## 2022-08-24 NOTE — Progress Notes (Signed)
Heart Failure Stewardship Pharmacist Progress Note   PCP: [name], MD PCP-Cardiologist: Charlton Haws, MD    HPI:  72 yo M with PMH of chronic HFrEF with EF as low as 35-40% in 12/2012 with normalization to 55-60% in 11/2021 but then down again to 20-25% on TEE in 04/2022, CAD, GIB, bradycardia, aflutter, HTN, HLD, OSA, CKD IIIa, and prior left nephrectomy.  Admitted in 12/2012 with STEMI s/p PCI to mild LAD. Readmitted 1 week later with in-stent restenosis complicated by VF on cath table, thought to be due to Brilinta failure vs noncompliance. LVEF was 35-40%.   Admitted in 04/2022 with altered mental status. Noncompliance to medications. Found to have atrial flutter and decline of LVEF. Underwent successful TEE/DCCV on 3/14. He left AMA.   Readmitted later that month with aflutter RVR, AMS, fall, and rib fractures. He was started on IV amiodarone and metoprolol. Amiodarone was later stopped due to concern for compliance. He was noted to have some pauses on telemetry so a live Zio monitor was placed and plan was for discharge to SNF on 05/24/2022; however, while waiting for transportation, he was noted to have a 4 second pause followed by a 5.8 second pause. Discharge was cancelled and BB was stopped. EP was consulted and he was restarted on IV amiodarone and then underwent repeat TEE/DCCV on 05/28/2022 with restoration of sinus rhythm. EP recommended avoiding permament pacemaker if at all possible given compliance issues.   Admitted 07/2022 with acute blood loss anemia requiring PRBC. GI recommended colonoscopy but patient declined, treated with IV iron. He did not follow up as outpatient.   Presented to the ED on 6/21 with shortness of breath, fatigue, weight gain, and LE edema despite taking increased lasix at home after speaking with outpatient cardiology on 6/18. CXR with mild cardiac enlargement. ECHO 6/22 showed LVEF 50%, regional wall motion abnormalities, RV normal.   Underwent  surgery for R colon mass on 6/27.  Current HF Medications: Diuretic: furosemide 40 mg PO BID SGLT2i: Jardiance 10 mg daily  Prior to admission HF Medications: Diuretic: furosemide 40 mg daily ACE/ARB/ARNI: Entresto 24/26 mg BID MRA: spironolactone 12.5 mg daily SGLT2i: Jardiance 10 mg daily  Pertinent Lab Values: Serum creatinine 1.77, BUN 17, Potassium 4.5, Sodium 140, BNP 26.9>41.5, Magnesium 2.0 TSAT 3, Ferritin 17  Vital Signs: Weight: 205 lbs (admission weight: 216 lbs) Blood pressure: 120-140/50s  Heart rate: 60s  I/O: +1.8L yesterday; net -6.7L  Medication Assistance / Insurance Benefits Check: Does the patient have prescription insurance?  Yes Type of insurance plan: [employer]  Outpatient Pharmacy:  Prior to admission outpatient pharmacy: [facility], mail order Is the patient willing to use [facility] TOC pharmacy at discharge? Yes Is the patient willing to transition their outpatient pharmacy to utilize a [facility] outpatient pharmacy?   No    Assessment: 1. Acute on chronic HFimpEF (LVEF 50%), due to ICM. NYHA class II symptoms. - Continue furosemide 40 mg PO BID. Strict I/Os and daily weights. Keep K>4 and Mg>2.  - No BB with bradycardia - Consider restarting Entresto 24/26 mg BID or spironolactone prior to discharge vs as outpatient pending renal function improvement. Baseline creatinine ~1.6 - Continue Jardiance 10 mg daily - Iron stores low. S/p Ferrlecit 250 mg IV x 4 doses.   Plan: 1) Medication changes recommended at this time: - Restart Entresto 24/26 mg BID or spironolactone prior to discharge vs as outpatient if creatinine improves  2) Patient assistance: - None pending - patient  states all copays are affordable for him. His daughter helps with medications at home and sets them up in pill packs for him.  3)  Education  - Patient has been educated on current HF medications and potential additions to HF medication regimen - Patient verbalizes understanding  that over the next few months, these medication doses may change and more medications may be added to optimize HF regimen - Patient has been educated on basic disease state pathophysiology and goals of therapy   Sharen Hones, PharmD, BCPS Heart Failure Stewardship Pharmacist Phone [phone]

## 2022-08-25 DIAGNOSIS — D649 Anemia, unspecified: Secondary | ICD-10-CM | POA: Diagnosis not present

## 2022-08-25 MED ORDER — ENOXAPARIN SODIUM 40 MG/0.4ML IJ SOSY
40.0000 mg | PREFILLED_SYRINGE | INTRAMUSCULAR | Status: DC
  Administered 2022-08-25 – 2022-08-27 (×3): 40 mg via SUBCUTANEOUS
  Filled 2022-08-25 (×3): qty 0.4

## 2022-08-25 MED ORDER — METHOCARBAMOL 500 MG PO TABS
500.0000 mg | ORAL_TABLET | Freq: Three times a day (TID) | ORAL | Status: DC
  Administered 2022-08-25 – 2022-08-28 (×10): 500 mg via ORAL
  Filled 2022-08-25 (×10): qty 1

## 2022-08-25 NOTE — Progress Notes (Signed)
Mobility Specialist Progress Note:   08/25/22 0925  Mobility  Activity Ambulated independently in hallway;Ambulated with assistance in hallway  Level of Assistance Standby assist, set-up cues, supervision of patient - no hands on  Assistive Device Four wheel walker;None  Distance Ambulated (ft) 500 ft  Activity Response Tolerated well  Mobility Referral Yes  $Mobility charge 1 Mobility  Mobility Specialist Start Time (ACUTE ONLY) [id number]  Mobility Specialist Stop Time (ACUTE ONLY) 0935  Mobility Specialist Time Calculation (min) (ACUTE ONLY) 10 min   Pt eager for mobility session. Required no physical assistance. Ambulated with rollator to begin with, then progressed to no AD use. No LOB or unsteadiness noted. Pt back in bed with all needs met.   Addison Lank Mobility Specialist Please contact via SecureChat or  Rehab office at [phone]

## 2022-08-25 NOTE — Progress Notes (Signed)
2 Days Post-Op   Subjective/Chief Complaint: PT doing well this AM Tol CLD and had small BM and flatus.  No n/v   Objective: Vital signs in last 24 hours: Temp:  [97.7 F (36.5 C)-98.6 F (37 C)] 97.9 F (36.6 C) (06/29 0801) Pulse Rate:  [67-82] 67 (06/29 0801) Resp:  [14-18] 18 (06/29 0801) BP: (148-173)/(52-66) 159/61 (06/29 0801) SpO2:  [94 %-96 %] 96 % (06/29 0801) Weight:  [93 kg] 93 kg (06/29 0534) Last BM Date : 08/22/22  Intake/Output from previous day: 06/28 0701 - 06/29 0700 In: 1923 [P.O.:1920; I.V.:3] Out: 1250 [Urine:1250] Intake/Output this shift: No intake/output data recorded.  General appearance: alert and cooperative GI: soft, approp ttp, inc c/d/i  with staples and honeycomb present, dressings over lap incisions  Lab Results:  Recent Labs    08/23/22 0053 08/24/22 0024  WBC 14.5* 14.3*  HGB 9.8* 9.7*  HCT 32.8* 31.8*  PLT 271 202   BMET Recent Labs    08/23/22 0053 08/24/22 0025  NA 136 140  K 3.2* 4.5  CL 100 105  CO2 25 26  GLUCOSE 93 115*  BUN 19 17  CREATININE 1.96* 1.77*  CALCIUM 8.9 8.4*   Studies/Results: No results found.  Anti-infectives: Anti-infectives (From admission, onward)    Start     Dose/Rate Route Frequency Ordered Stop   08/23/22 0700  cefoTEtan (CEFOTAN) 2 g in sodium chloride 0.9 % 100 mL IVPB        2 g 200 mL/hr over 30 Minutes Intravenous On call to O.R. 08/22/22 2158 08/23/22 0955   08/22/22 2215  neomycin (MYCIFRADIN) tablet 1,000 mg       See Hyperspace for full Linked Orders Report.   1,000 mg Oral Every 2 hours 08/22/22 2158 08/23/22 0405   08/22/22 2215  metroNIDAZOLE (FLAGYL) tablet 1,000 mg       See Hyperspace for full Linked Orders Report.   1,000 mg Oral Every 2 hours 08/22/22 2158 08/23/22 0204       Assessment/Plan: s/p Procedure(s): LAPAROSCOPIC CONVERTED TO OPEN HEMICOLECTOMY (Right) Dr. Derrell Lolling POD 2 -adv to FLD -entereg -mobilize and doing well with mobilization -awaiting  path -WBC stable yesterday at 14 as well as hgb.  Recheck in am -start lovenox today, if hgb stable can likely resume Xarelto tomorrow  FEN - FLD VTE - lovenox ID - none needed  PAF CKD CAD CHF HTN HLD   LOS: 7 days    Brendan Butler 08/25/2022

## 2022-08-25 NOTE — Progress Notes (Signed)
PROGRESS NOTE    Brendan Butler  ZOX:[id number] DOB: [birth year] DOA: 08/17/2022 PCP: [name], MD    As per prior documentation done by Dr. [name]: "71/M with history of CAD, multiple PCI and stenting, atrial flutter, depression, hypertension, OSA, CKD 3, bronchiectasis, left nephrectomy presented to the ED with shortness of breath and edema, progressive weight gain and fatigue. -Recently admitted with worsening anemia and positive Hemoccult, transfused PRBC, declined colonoscopy then. -In the ED heart rate 40-50, creatinine 1.9, hemoglobin 7.3, chest x-ray noted mild cardiomegaly. -Admitted, improving with diuretics, blood transfusion -Gastroenterology consulted for iron deficiency anemia, colonoscopy 6/24 noted colon mass, biopsy noted invasive moderate to well-differentiated adeno CA -6/26, surgery consulted"  08/23/2022: Patient underwent open hemicolectomy with ileocolonic anastomosis for right colon mass.  Surgery teams input is highly appreciated.  Patient seems stable postop.  08/25/2022: Patient seen.  Post op day 2.  No new complaints.  Subcutaneous Lovenox to be restarted today.  Full dose anticoagulation when okay with the surgical team.  Assessment and Plan:  Colon adenocarcinoma, new diagnosis Iron deficiency anemia -Recent diagnosis, had heme positive stools on admission few weeks ago, declined GI workup then, now amenable -Holding Xarelto [facility] gastroenterology consulted, EGD/colonoscopy 6/25, colon mass noted, biopsy pending -Transfused 1 unit PRBC, given IV iron -Hemoglobin stable, improving -Request surgery evaluation 08/25/2022: Status post right hemicolectomy (open), with ileocolonic anastomosis.  Patient is stable.  Acute on chronic diastolic CHF -Last echo 6/24 with EF 50%, regional wall motion abnormalities, normal RV -Worsened in the setting of anemia, troponin is normal -Now improving, volume status has improved, he is 5.7 L negative, weight down 13  LB -Changed to oral Lasix, continue Jardiance -CHF TOC follow-up made 08/25/2022: Consulted.  AKI on CKD 3a -Likely hemodynamically mediated, compounded by worsening anemia -Holding Entresto, baseline creatinine around 1.6, now 1.9-2 -Creatinine plateauing around this level 08/23/2022: Baseline serum creatinine is 1.65.  Serum creatinine continues to improve.  Serum creatinine today is 1.96. 08/24/2022: Serum creatinine today is down to 1.77.  Hypokalemia: -Potassium of 3.2 today. -Replete potassium cautiously, considering impaired renal function.  Renal panel in the morning.   08/24/2022: Resolved.  Potassium is 4.5 today.  Lung Nodule -Concerning for adeno CA, noted on prior imaging, followed by Dr. Delton Coombes, he missed his appointment for PET scan -This will need to be rescheduled, emphasized with patient regarding importance of this workup  Atrial flutter -With bradycardia, currently on amiodarone,  -Heart rate improved, Xarelto held with colon cancer and GI bleed  CAD HLD > Status post multiple stenting > Does notably have an aspirin allergy - Continue  statin,  - Hold Xarelto as above   Depression - Continue home bupropion, paroxetine, Seroquel   Hypertension -Holding Entresto and Aldactone    OSA - Not on CPAP per chart review   DVT prophylaxis: SCDs Code Status: Full code Family Communication: None present Disposition Plan: Home pending ongoing workup  Consultants: Gastroenterology, cards, general surgery  Antimicrobials:    Objective: Vitals:   08/24/22 2003 08/25/22 0042 08/25/22 0534 08/25/22 0801  BP: (!) 148/52 (!) 173/65 (!) 153/55 (!) 159/61  Pulse: 69 77 71 67  Resp: 15 14  18   Temp: 98.4 F (36.9 C) 97.7 F (36.5 C) 98.6 F (37 C) 97.9 F (36.6 C)  TempSrc: Oral Oral Oral Oral  SpO2: 94% 94% 96% 96%  Weight:   93 kg   Height:        Intake/Output Summary (Last 24 hours) at 08/25/2022  1119 Last data filed at 08/25/2022 0600 Gross per 24  hour  Intake 1323 ml  Output 1050 ml  Net 273 ml    Filed Weights   08/23/22 0751 08/24/22 0439 08/25/22 0534  Weight: 93.4 kg 93.4 kg 93 kg    Examination: General condition: Not in any distress.  Awake and alert. HEENT: Patient is pale. Neck: Supple. Lungs: Clear to auscultation. CVS: S1-S2. Abdomen: Obese, soft and nontender.  Surgical wound is noted. Neuro: Awake and alert.  Moves all extremities. Extremities: Mild edema of the upper and lower extremities.   Data Reviewed:   CBC: Recent Labs  Lab 08/20/22 0032 08/21/22 0041 08/22/22 0048 08/23/22 0053 08/24/22 0024  WBC 10.6* 11.9* 11.8* 14.5* 14.3*  HGB 9.2* 9.1* 9.0* 9.8* 9.7*  HCT 30.6* 29.5* 29.2* 32.8* 31.8*  MCV 82.0 81.7 82.0 82.2 83.0  PLT 264 261 253 271 202    Basic Metabolic Panel: Recent Labs  Lab 08/20/22 0032 08/21/22 0041 08/22/22 0048 08/23/22 0053 08/24/22 0025  NA 137 138 137 136 140  K 3.5 3.5 3.5 3.2* 4.5  CL 99 101 101 100 105  CO2 26 27 25 25 26   GLUCOSE 90 98 102* 93 115*  BUN 24* 25* 21 19 17   CREATININE 1.93* 1.99* 2.00* 1.96* 1.77*  CALCIUM 8.4* 8.6* 8.7* 8.9 8.4*  MG  --   --   --   --  2.0  PHOS  --   --   --   --  3.9    GFR: Estimated Creatinine Clearance: 45.1 mL/min (A) (by C-G formula based on SCr of 1.77 mg/dL (H)). Liver Function Tests: Recent Labs  Lab 08/24/22 0025  ALBUMIN 3.1*    No results for input(s): "LIPASE", "AMYLASE" in the last 168 hours. No results for input(s): "AMMONIA" in the last 168 hours. Coagulation Profile: No results for input(s): "INR", "PROTIME" in the last 168 hours. Cardiac Enzymes: No results for input(s): "CKTOTAL", "CKMB", "CKMBINDEX", "TROPONINI" in the last 168 hours. BNP (last 3 results) No results for input(s): "PROBNP" in the last 8760 hours. HbA1C: No results for input(s): "HGBA1C" in the last 72 hours. CBG: No results for input(s): "GLUCAP" in the last 168 hours. Lipid Profile: No results for input(s): "CHOL",  "HDL", "LDLCALC", "TRIG", "CHOLHDL", "LDLDIRECT" in the last 72 hours. Thyroid Function Tests: No results for input(s): "TSH", "T4TOTAL", "FREET4", "T3FREE", "THYROIDAB" in the last 72 hours.  Anemia Panel: No results for input(s): "VITAMINB12", "FOLATE", "FERRITIN", "TIBC", "IRON", "RETICCTPCT" in the last 72 hours.  Urine analysis:    Component Value Date/Time   COLORURINE AMBER (A) 05/20/2022 2038   APPEARANCEUR CLEAR 05/20/2022 2038   LABSPEC 1.034 (H) 05/20/2022 2038   PHURINE 5.0 05/20/2022 2038   GLUCOSEU NEGATIVE 05/20/2022 2038   HGBUR NEGATIVE 05/20/2022 2038   BILIRUBINUR NEGATIVE 05/20/2022 2038   KETONESUR NEGATIVE 05/20/2022 2038   PROTEINUR NEGATIVE 05/20/2022 2038   NITRITE NEGATIVE 05/20/2022 2038   LEUKOCYTESUR NEGATIVE 05/20/2022 2038   Sepsis Labs: @LABRCNTIP (procalcitonin:4,lacticidven:4)  ) Recent Results (from the past 240 hour(s))  Surgical pcr screen     Status: None   Collection Time: 08/22/22  9:15 PM   Specimen: Nasal Mucosa; Nasal Swab  Result Value Ref Range Status   MRSA, PCR NEGATIVE NEGATIVE Final   Staphylococcus aureus NEGATIVE NEGATIVE Final    Comment: (NOTE) The Xpert SA Assay (FDA approved for NASAL specimens in patients 73 years of age and older), is one component of a comprehensive surveillance program. It  is not intended to diagnose infection nor to guide or monitor treatment. Performed at [facility], [address] [place], [place] [address]       Radiology Studies: No results found.   Scheduled Meds:  (feeding supplement) PROSource Plus  30 mL Oral TID BM   sodium chloride   Intravenous Once   acetaminophen  1,000 mg Oral Q6H   amiodarone  100 mg Oral Daily   buPROPion  150 mg Oral Daily   empagliflozin  10 mg Oral Daily   enoxaparin (LOVENOX) injection  40 mg Subcutaneous Q24H   feeding supplement  1 Container Oral TID BM   furosemide  40 mg Oral BID   methocarbamol  500 mg Oral TID   multivitamin with  minerals  1 tablet Oral Daily   pantoprazole  40 mg Oral Daily   PARoxetine  30 mg Oral Daily   pregabalin  150 mg Oral TID   QUEtiapine  25 mg Oral QHS   rosuvastatin  20 mg Oral Daily   sodium chloride flush  3 mL Intravenous Q12H   Continuous Infusions:  sodium chloride 10 mL/hr (08/23/22 1810)   promethazine (PHENERGAN) injection (IM or IVPB) 200 mL/hr at 08/23/22 1248     LOS: 7 days    Time spent:    Berton Mount, MD.   [facility]   08/25/2022, 11:19 AM

## 2022-08-26 DIAGNOSIS — C189 Malignant neoplasm of colon, unspecified: Secondary | ICD-10-CM

## 2022-08-26 DIAGNOSIS — D649 Anemia, unspecified: Secondary | ICD-10-CM | POA: Diagnosis not present

## 2022-08-26 HISTORY — DX: Malignant neoplasm of colon, unspecified: C18.9

## 2022-08-26 LAB — CBC WITH DIFFERENTIAL/PLATELET
Abs Immature Granulocytes: 0.04 10*3/uL (ref 0.00–0.07)
Basophils Absolute: 0.1 10*3/uL (ref 0.0–0.1)
Basophils Relative: 0 %
Eosinophils Absolute: 0.5 10*3/uL (ref 0.0–0.5)
Eosinophils Relative: 4 %
HCT: 28.6 % — ABNORMAL LOW (ref 39.0–52.0)
Hemoglobin: 8.8 g/dL — ABNORMAL LOW (ref 13.0–17.0)
Immature Granulocytes: 0 %
Lymphocytes Relative: 25 %
Lymphs Abs: 3 10*3/uL (ref 0.7–4.0)
MCH: 25.8 pg — ABNORMAL LOW (ref 26.0–34.0)
MCHC: 30.8 g/dL (ref 30.0–36.0)
MCV: 83.9 fL (ref 80.0–100.0)
Monocytes Absolute: 1.6 10*3/uL — ABNORMAL HIGH (ref 0.1–1.0)
Monocytes Relative: 14 %
Neutro Abs: 6.6 10*3/uL (ref 1.7–7.7)
Neutrophils Relative %: 57 %
Platelets: 187 10*3/uL (ref 150–400)
RBC: 3.41 MIL/uL — ABNORMAL LOW (ref 4.22–5.81)
RDW: 18.1 % — ABNORMAL HIGH (ref 11.5–15.5)
WBC: 11.8 10*3/uL — ABNORMAL HIGH (ref 4.0–10.5)
nRBC: 0 % (ref 0.0–0.2)

## 2022-08-26 LAB — RENAL FUNCTION PANEL
Albumin: 3 g/dL — ABNORMAL LOW (ref 3.5–5.0)
Anion gap: 10 (ref 5–15)
BUN: 20 mg/dL (ref 8–23)
CO2: 29 mmol/L (ref 22–32)
Calcium: 8.6 mg/dL — ABNORMAL LOW (ref 8.9–10.3)
Chloride: 99 mmol/L (ref 98–111)
Creatinine, Ser: 1.85 mg/dL — ABNORMAL HIGH (ref 0.61–1.24)
GFR, Estimated: 39 mL/min — ABNORMAL LOW (ref >60)
Glucose, Bld: 94 mg/dL (ref 70–99)
Phosphorus: 3.5 mg/dL (ref 2.5–4.6)
Potassium: 3.2 mmol/L — ABNORMAL LOW (ref 3.5–5.1)
Sodium: 138 mmol/L (ref 135–145)

## 2022-08-26 LAB — MAGNESIUM: Magnesium: 2.1 mg/dL (ref 1.7–2.4)

## 2022-08-26 MED ORDER — POTASSIUM CHLORIDE 20 MEQ PO PACK
20.0000 meq | PACK | Freq: Once | ORAL | Status: AC
  Administered 2022-08-26: 20 meq via ORAL
  Filled 2022-08-26: qty 1

## 2022-08-26 NOTE — Progress Notes (Signed)
PROGRESS NOTE    Brendan Butler  JYN:[id number] DOB: 07-21-1950 DOA: 08/17/2022 PCP: [name], MD    As per prior documentation done by Dr. [name]: "72/M with history of CAD, multiple PCI and stenting, atrial flutter, depression, hypertension, OSA, CKD 3, bronchiectasis, left nephrectomy presented to the ED with shortness of breath and edema, progressive weight gain and fatigue. -Recently admitted with worsening anemia and positive Hemoccult, transfused PRBC, declined colonoscopy then. -In the ED heart rate 40-50, creatinine 1.9, hemoglobin 7.3, chest x-ray noted mild cardiomegaly. -Admitted, improving with diuretics, blood transfusion -Gastroenterology consulted for iron deficiency anemia, colonoscopy 6/24 noted colon mass, biopsy noted invasive moderate to well-differentiated adeno CA -6/26, surgery consulted"  08/23/2022: Patient underwent open hemicolectomy with ileocolonic anastomosis for right colon mass.  Surgery teams input is highly appreciated.  Patient seems stable postop.  08/25/2022: Patient seen.  Post op day 2.  No new complaints.  Subcutaneous Lovenox to be restarted today.  Full dose anticoagulation when okay with the surgical team.  08/26/2022: Postop day 3.  Patient continues to improve.  Surgery team input is appreciated.  Query resume Xarelto tomorrow i.e. if okay with the surgical team.  Assessment and Plan:  Colon adenocarcinoma, new diagnosis Iron deficiency anemia -Recent diagnosis, had heme positive stools on admission few weeks ago, declined GI workup then, now amenable -Holding Xarelto [facility] gastroenterology consulted, EGD/colonoscopy 6/25, colon mass noted, biopsy pending -Transfused 1 unit PRBC, given IV iron -Hemoglobin stable, improving -Request surgery evaluation 08/26/2022: Status post right hemicolectomy (open), with ileocolonic anastomosis.  Patient is stable.  Acute on chronic diastolic CHF -Last echo 6/24 with EF 50%, regional wall motion  abnormalities, normal RV -Worsened in the setting of anemia, troponin is normal -Now improving, volume status has improved, he is 5.7 L negative, weight down 13 LB -Changed to oral Lasix, continue Jardiance -CHF TOC follow-up made 08/26/2022: Compensated.  AKI on CKD 3a -Likely hemodynamically mediated, compounded by worsening anemia -Holding Entresto, baseline creatinine around 1.6, now 1.9-2 -Creatinine plateauing around this level 08/23/2022: Baseline serum creatinine is 1.65.  Serum creatinine continues to improve.  Serum creatinine today is 1.96. 08/24/2022: Serum creatinine today is down to 1.77. 08/26/2022: Serum creatinine of 1.85 noted.  Continue to monitor renal function and electrolytes.  Hypokalemia: -Potassium of 3.2 today. -Replete potassium cautiously.  KCl 20 mEq p.o. x 1 dose ordered.  Renal panel in AM. -Continue to monitor renal function and electrolytes.  Lung Nodule -Concerning for adeno CA, noted on prior imaging, followed by Dr. Delton Coombes, he missed his appointment for PET scan -This will need to be rescheduled, emphasized with patient regarding importance of this workup  Atrial flutter -With bradycardia, currently on amiodarone,  -Heart rate improved, Xarelto held with colon cancer and GI bleed  CAD HLD > Status post multiple stenting > Does notably have an aspirin allergy - Continue  statin,  - Hold Xarelto as above   Depression - Continue home bupropion, paroxetine, Seroquel   Hypertension -Holding Entresto and Aldactone    OSA - Not on CPAP per chart review   DVT prophylaxis: SCDs Code Status: Full code Family Communication: None present Disposition Plan: Home pending ongoing workup  Consultants: Gastroenterology, cards, general surgery  Antimicrobials:    Objective: Vitals:   08/26/22 0500 08/26/22 0721 08/26/22 0740 08/26/22 1124  BP:  (!) 125/53  (!) 139/56  Pulse:  69 66 70  Resp:  18  19  Temp:  98.5 F (36.9 C)  98.6 F (37  C)   TempSrc:  Oral  Oral  SpO2:  93% 95% 95%  Weight: 94.1 kg     Height:        Intake/Output Summary (Last 24 hours) at 08/26/2022 1320 Last data filed at 08/26/2022 0981 Gross per 24 hour  Intake 960 ml  Output 1100 ml  Net -140 ml    Filed Weights   08/24/22 0439 08/25/22 0534 08/26/22 0500  Weight: 93.4 kg 93 kg 94.1 kg    Examination: General condition: Not in any distress.  Awake and alert. HEENT: Patient is pale. Neck: Supple. Lungs: Clear to auscultation. CVS: S1-S2. Abdomen: Obese, soft and nontender.  Surgical wound is noted. Neuro: Awake and alert.  Moves all extremities. Extremities: Mild edema of the upper and lower extremities.   Data Reviewed:   CBC: Recent Labs  Lab 08/21/22 0041 08/22/22 0048 08/23/22 0053 08/24/22 0024 08/26/22 0040  WBC 11.9* 11.8* 14.5* 14.3* 11.8*  NEUTROABS  --   --   --   --  6.6  HGB 9.1* 9.0* 9.8* 9.7* 8.8*  HCT 29.5* 29.2* 32.8* 31.8* 28.6*  MCV 81.7 82.0 82.2 83.0 83.9  PLT 261 253 271 202 187    Basic Metabolic Panel: Recent Labs  Lab 08/21/22 0041 08/22/22 0048 08/23/22 0053 08/24/22 0025 08/26/22 0040  NA 138 137 136 140 138  K 3.5 3.5 3.2* 4.5 3.2*  CL 101 101 100 105 99  CO2 27 25 25 26 29   GLUCOSE 98 102* 93 115* 94  BUN 25* 21 19 17 20   CREATININE 1.99* 2.00* 1.96* 1.77* 1.85*  CALCIUM 8.6* 8.7* 8.9 8.4* 8.6*  MG  --   --   --  2.0 2.1  PHOS  --   --   --  3.9 3.5    GFR: Estimated Creatinine Clearance: 43.4 mL/min (A) (by C-G formula based on SCr of 1.85 mg/dL (H)). Liver Function Tests: Recent Labs  Lab 08/24/22 0025 08/26/22 0040  ALBUMIN 3.1* 3.0*    No results for input(s): "LIPASE", "AMYLASE" in the last 168 hours. No results for input(s): "AMMONIA" in the last 168 hours. Coagulation Profile: No results for input(s): "INR", "PROTIME" in the last 168 hours. Cardiac Enzymes: No results for input(s): "CKTOTAL", "CKMB", "CKMBINDEX", "TROPONINI" in the last 168 hours. BNP (last 3  results) No results for input(s): "PROBNP" in the last 8760 hours. HbA1C: No results for input(s): "HGBA1C" in the last 72 hours. CBG: No results for input(s): "GLUCAP" in the last 168 hours. Lipid Profile: No results for input(s): "CHOL", "HDL", "LDLCALC", "TRIG", "CHOLHDL", "LDLDIRECT" in the last 72 hours. Thyroid Function Tests: No results for input(s): "TSH", "T4TOTAL", "FREET4", "T3FREE", "THYROIDAB" in the last 72 hours.  Anemia Panel: No results for input(s): "VITAMINB12", "FOLATE", "FERRITIN", "TIBC", "IRON", "RETICCTPCT" in the last 72 hours.  Urine analysis:    Component Value Date/Time   COLORURINE AMBER (A) 05/20/2022 2038   APPEARANCEUR CLEAR 05/20/2022 2038   LABSPEC 1.034 (H) 05/20/2022 2038   PHURINE 5.0 05/20/2022 2038   GLUCOSEU NEGATIVE 05/20/2022 2038   HGBUR NEGATIVE 05/20/2022 2038   BILIRUBINUR NEGATIVE 05/20/2022 2038   KETONESUR NEGATIVE 05/20/2022 2038   PROTEINUR NEGATIVE 05/20/2022 2038   NITRITE NEGATIVE 05/20/2022 2038   LEUKOCYTESUR NEGATIVE 05/20/2022 2038   Sepsis Labs: @LABRCNTIP (procalcitonin:4,lacticidven:4)  ) Recent Results (from the past 240 hour(s))  Surgical pcr screen     Status: None   Collection Time: 08/22/22  9:15 PM   Specimen: Nasal Mucosa; Nasal Swab  Result Value Ref Range Status   MRSA, PCR NEGATIVE NEGATIVE Final   Staphylococcus aureus NEGATIVE NEGATIVE Final    Comment: (NOTE) The Xpert SA Assay (FDA approved for NASAL specimens in patients 62 years of age and older), is one component of a comprehensive surveillance program. It is not intended to diagnose infection nor to guide or monitor treatment. Performed at [facility], [address], [place], [place] [address]       Radiology Studies: No results found.   Scheduled Meds:  (feeding supplement) PROSource Plus  30 mL Oral TID BM   sodium chloride   Intravenous Once   acetaminophen  1,000 mg Oral Q6H   amiodarone  100 mg Oral Daily   buPROPion   150 mg Oral Daily   empagliflozin  10 mg Oral Daily   enoxaparin (LOVENOX) injection  40 mg Subcutaneous Q24H   feeding supplement  1 Container Oral TID BM   furosemide  40 mg Oral BID   methocarbamol  500 mg Oral TID   multivitamin with minerals  1 tablet Oral Daily   pantoprazole  40 mg Oral Daily   PARoxetine  30 mg Oral Daily   pregabalin  150 mg Oral TID   QUEtiapine  25 mg Oral QHS   rosuvastatin  20 mg Oral Daily   sodium chloride flush  3 mL Intravenous Q12H   Continuous Infusions:  sodium chloride 10 mL/hr (08/23/22 1810)   promethazine (PHENERGAN) injection (IM or IVPB) 200 mL/hr at 08/23/22 1248     LOS: 8 days    Time spent:    Berton Mount, MD.   [facility]   08/26/2022, 1:20 PM

## 2022-08-26 NOTE — Progress Notes (Signed)
  3 Days Post-Op   Subjective/Chief Complaint: PT doing well this AM Tol FLD and having soft/loose stools.  No n/v No cp/sob Walked in halls Family at bs Some abd discomfort with activity   Objective: Vital signs in last 24 hours: Temp:  [97.9 F (36.6 C)-98.5 F (36.9 C)] 98.5 F (36.9 C) (06/30 0721) Pulse Rate:  [64-69] 66 (06/30 0740) Resp:  [18] 18 (06/30 0721) BP: (125-151)/(51-59) 125/53 (06/30 0721) SpO2:  [93 %-97 %] 95 % (06/30 0740) Weight:  [94.1 kg] 94.1 kg (06/30 0500) Last BM Date : 08/23/22  Intake/Output from previous day: 06/29 0701 - 06/30 0700 In: 363 [P.O.:360; I.V.:3] Out: -  Intake/Output this shift: Total I/O In: -  Out: 1100 [Urine:1100]  General appearance: alert and cooperative GI: soft, approp ttp, inc c/d/i  with staples and honeycomb present, dressings over lap incisions  Lab Results:  Recent Labs    08/24/22 0024 08/26/22 0040  WBC 14.3* 11.8*  HGB 9.7* 8.8*  HCT 31.8* 28.6*  PLT 202 187    BMET Recent Labs    08/24/22 0025 08/26/22 0040  NA 140 138  K 4.5 3.2*  CL 105 99  CO2 26 29  GLUCOSE 115* 94  BUN 17 20  CREATININE 1.77* 1.85*  CALCIUM 8.4* 8.6*    Studies/Results: No results found.  Anti-infectives: Anti-infectives (From admission, onward)    Start     Dose/Rate Route Frequency Ordered Stop   08/23/22 0700  cefoTEtan (CEFOTAN) 2 g in sodium chloride 0.9 % 100 mL IVPB        2 g 200 mL/hr over 30 Minutes Intravenous On call to O.R. 08/22/22 2158 08/23/22 0955   08/22/22 2215  neomycin (MYCIFRADIN) tablet 1,000 mg       See Hyperspace for full Linked Orders Report.   1,000 mg Oral Every 2 hours 08/22/22 2158 08/23/22 0405   08/22/22 2215  metroNIDAZOLE (FLAGYL) tablet 1,000 mg       See Hyperspace for full Linked Orders Report.   1,000 mg Oral Every 2 hours 08/22/22 2158 08/23/22 0204       Assessment/Plan: s/p Procedure(s): LAPAROSCOPIC CONVERTED TO OPEN HEMICOLECTOMY (Right) Dr. Derrell Lolling POD  3 -adv to soft -entereg -mobilize and doing well with mobilization -awaiting path -WBC decreasing  -Hgb down a little - Recheck in am -cont lovenox today, if hgb stable can likely resume Xarelto tomorrow  FEN - soft diet VTE - lovenox ID - none needed  PAF CKD CAD CHF HTN HLD  Dispo - likely home Monday from a abdominal surgery point of view   LOS: 8 days    Gaynelle Adu 08/26/2022

## 2022-08-26 NOTE — Progress Notes (Signed)
Mobility Specialist Progress Note:   08/26/22 1000  Mobility  Activity Ambulated with assistance in hallway  Level of Assistance Standby assist, set-up cues, supervision of patient - no hands on  Assistive Device None  Distance Ambulated (ft) 800 ft  Activity Response Tolerated well  Mobility Referral Yes  $Mobility charge 1 Mobility  Mobility Specialist Start Time (ACUTE ONLY) 1000  Mobility Specialist Stop Time (ACUTE ONLY) 1014  Mobility Specialist Time Calculation (min) (ACUTE ONLY) 14 min   Pt eager for mobility session. No physical assistance required. Minor LOBs noted throughout ambulation, however pt able to self-correct. Pt c/o minor abdominal soreness, otherwise asx. Pt to BR before returning to bed.   Addison Lank Mobility Specialist Please contact via SecureChat or  Rehab office at [phone]

## 2022-08-27 DIAGNOSIS — D649 Anemia, unspecified: Secondary | ICD-10-CM | POA: Diagnosis not present

## 2022-08-27 LAB — BASIC METABOLIC PANEL
Anion gap: 11 (ref 5–15)
BUN: 24 mg/dL — ABNORMAL HIGH (ref 8–23)
CO2: 27 mmol/L (ref 22–32)
Calcium: 8.6 mg/dL — ABNORMAL LOW (ref 8.9–10.3)
Chloride: 101 mmol/L (ref 98–111)
Creatinine, Ser: 1.95 mg/dL — ABNORMAL HIGH (ref 0.61–1.24)
GFR, Estimated: 37 mL/min — ABNORMAL LOW (ref >60)
Glucose, Bld: 126 mg/dL — ABNORMAL HIGH (ref 70–99)
Potassium: 3.1 mmol/L — ABNORMAL LOW (ref 3.5–5.1)
Sodium: 139 mmol/L (ref 135–145)

## 2022-08-27 LAB — RENAL FUNCTION PANEL
Albumin: 2.7 g/dL — ABNORMAL LOW (ref 3.5–5.0)
Anion gap: 10 (ref 5–15)
BUN: 23 mg/dL (ref 8–23)
CO2: 29 mmol/L (ref 22–32)
Calcium: 8.5 mg/dL — ABNORMAL LOW (ref 8.9–10.3)
Chloride: 99 mmol/L (ref 98–111)
Creatinine, Ser: 1.92 mg/dL — ABNORMAL HIGH (ref 0.61–1.24)
GFR, Estimated: 37 mL/min — ABNORMAL LOW (ref >60)
Glucose, Bld: 108 mg/dL — ABNORMAL HIGH (ref 70–99)
Phosphorus: 3.2 mg/dL (ref 2.5–4.6)
Potassium: 2.9 mmol/L — ABNORMAL LOW (ref 3.5–5.1)
Sodium: 138 mmol/L (ref 135–145)

## 2022-08-27 LAB — CBC
HCT: 27.4 % — ABNORMAL LOW (ref 39.0–52.0)
Hemoglobin: 8.5 g/dL — ABNORMAL LOW (ref 13.0–17.0)
MCH: 25.3 pg — ABNORMAL LOW (ref 26.0–34.0)
MCHC: 31 g/dL (ref 30.0–36.0)
MCV: 81.5 fL (ref 80.0–100.0)
Platelets: 195 10*3/uL (ref 150–400)
RBC: 3.36 MIL/uL — ABNORMAL LOW (ref 4.22–5.81)
RDW: 18 % — ABNORMAL HIGH (ref 11.5–15.5)
WBC: 11 10*3/uL — ABNORMAL HIGH (ref 4.0–10.5)
nRBC: 0 % (ref 0.0–0.2)

## 2022-08-27 LAB — MAGNESIUM: Magnesium: 2.2 mg/dL (ref 1.7–2.4)

## 2022-08-27 MED ORDER — POTASSIUM CHLORIDE 20 MEQ PO PACK
40.0000 meq | PACK | Freq: Four times a day (QID) | ORAL | Status: DC
  Filled 2022-08-27: qty 2

## 2022-08-27 MED ORDER — POTASSIUM CHLORIDE CRYS ER 20 MEQ PO TBCR
40.0000 meq | EXTENDED_RELEASE_TABLET | ORAL | Status: AC
  Administered 2022-08-27 (×2): 40 meq via ORAL
  Filled 2022-08-27 (×2): qty 2

## 2022-08-27 MED ORDER — POTASSIUM CHLORIDE 20 MEQ PO PACK: 40.0000 meq | PACK | Freq: Four times a day (QID) | ORAL | Status: DC

## 2022-08-27 MED ORDER — SACUBITRIL-VALSARTAN 24-26 MG PO TABS
1.0000 | ORAL_TABLET | Freq: Two times a day (BID) | ORAL | Status: DC
  Administered 2022-08-27 – 2022-08-28 (×3): 1 via ORAL
  Filled 2022-08-27 (×3): qty 1

## 2022-08-27 MED ORDER — RIVAROXABAN 15 MG PO TABS
15.0000 mg | ORAL_TABLET | Freq: Every day | ORAL | Status: DC
  Administered 2022-08-27: 15 mg via ORAL
  Filled 2022-08-27: qty 1

## 2022-08-27 MED ORDER — POTASSIUM CHLORIDE CRYS ER 20 MEQ PO TBCR
40.0000 meq | EXTENDED_RELEASE_TABLET | Freq: Four times a day (QID) | ORAL | Status: AC
  Administered 2022-08-27 – 2022-08-28 (×2): 40 meq via ORAL
  Filled 2022-08-27 (×2): qty 2

## 2022-08-27 MED ORDER — HYDRALAZINE HCL 25 MG PO TABS: 25.0000 mg | ORAL_TABLET | Freq: Four times a day (QID) | ORAL | Status: DC | PRN

## 2022-08-27 MED ORDER — SPIRONOLACTONE 12.5 MG HALF TABLET
12.5000 mg | ORAL_TABLET | Freq: Every day | ORAL | Status: DC
  Administered 2022-08-27 – 2022-08-28 (×2): 12.5 mg via ORAL
  Filled 2022-08-27 (×2): qty 1

## 2022-08-27 NOTE — Progress Notes (Signed)
Heart Failure Stewardship Pharmacist Progress Note   PCP: [name], MD PCP-Cardiologist: Charlton Haws, MD    HPI:  72 yo M with PMH of chronic HFrEF with EF as low as 35-40% in 12/2012 with normalization to 55-60% in 11/2021 but then down again to 20-25% on TEE in 04/2022, CAD, GIB, bradycardia, aflutter, HTN, HLD, OSA, CKD IIIa, and prior left nephrectomy.  Admitted in 12/2012 with STEMI s/p PCI to mild LAD. Readmitted 1 week later with in-stent restenosis complicated by VF on cath table, thought to be due to Brilinta failure vs noncompliance. LVEF was 35-40%.   Admitted in 04/2022 with altered mental status. Noncompliance to medications. Found to have atrial flutter and decline of LVEF. Underwent successful TEE/DCCV on 3/14. He left AMA.   Readmitted later that month with aflutter RVR, AMS, fall, and rib fractures. He was started on IV amiodarone and metoprolol. Amiodarone was later stopped due to concern for compliance. He was noted to have some pauses on telemetry so a live Zio monitor was placed and plan was for discharge to SNF on 05/24/2022; however, while waiting for transportation, he was noted to have a 4 second pause followed by a 5.8 second pause. Discharge was cancelled and BB was stopped. EP was consulted and he was restarted on IV amiodarone and then underwent repeat TEE/DCCV on 05/28/2022 with restoration of sinus rhythm. EP recommended avoiding permament pacemaker if at all possible given compliance issues.   Admitted 07/2022 with acute blood loss anemia requiring PRBC. GI recommended colonoscopy but patient declined, treated with IV iron. He did not follow up as outpatient.   Presented to the ED on 6/21 with shortness of breath, fatigue, weight gain, and LE edema despite taking increased lasix at home after speaking with outpatient cardiology on 6/18. CXR with mild cardiac enlargement. ECHO 6/22 showed LVEF 50%, regional wall motion abnormalities, RV normal.   Underwent  surgery for R colon mass on 6/27. Resumed Xarelto on 7/1.  Current HF Medications: Diuretic: furosemide 40 mg PO BID SGLT2i: Jardiance 10 mg daily  Prior to admission HF Medications: Diuretic: furosemide 40 mg daily ACE/ARB/ARNI: Entresto 24/26 mg BID MRA: spironolactone 12.5 mg daily SGLT2i: Jardiance 10 mg daily  Pertinent Lab Values: Serum creatinine 1.92, BUN 23, Potassium 2.9, Sodium 138, BNP 26.9>41.5, Magnesium 2.2 TSAT 3, Ferritin 17  Vital Signs: Weight: 207 lbs (admission weight: 216 lbs) Blood pressure: 150/50s  Heart rate: 50-60s  I/O: net -6.5L  Medication Assistance / Insurance Benefits Check: Does the patient have prescription insurance?  Yes Type of insurance plan: [employer]  Outpatient Pharmacy:  Prior to admission outpatient pharmacy: [facility], mail order Is the patient willing to use [facility] TOC pharmacy at discharge? Yes Is the patient willing to transition their outpatient pharmacy to utilize a [facility] outpatient pharmacy?   No    Assessment: 1. Acute on chronic HFimpEF (LVEF 50%), due to ICM. NYHA class II symptoms. - Continue furosemide 40 mg PO BID. Strict I/Os and daily weights. Keep K>4 and Mg>2. KCl 40 mEq x 2 ordered for replacement.  - No BB with bradycardia - Consider restarting Entresto 24/26 mg BID or spironolactone as outpatient pending renal function improvement. Baseline creatinine ~1.6 - Continue Jardiance 10 mg daily - Iron stores low. S/p Ferrlecit 250 mg IV x 4 doses.   Plan: 1) Medication changes recommended at this time: - Restart Entresto 24/26 mg BID or spironolactone as outpatient if creatinine improves  2) Patient assistance: - None pending -  patient states all copays are affordable for him. His daughter helps with medications at home and sets them up in pill packs for him.  3)  Education  - Patient has been educated on current HF medications and potential additions to HF medication regimen - Patient verbalizes  understanding that over the next few months, these medication doses may change and more medications may be added to optimize HF regimen - Patient has been educated on basic disease state pathophysiology and goals of therapy   Sharen Hones, PharmD, BCPS Heart Failure Stewardship Pharmacist Phone [phone]

## 2022-08-27 NOTE — Progress Notes (Signed)
Physical Therapy Treatment Patient Details Name: Brendan Butler MRN: [id number] DOB: Oct 09, 1950 Today's Date: 08/27/2022   History of Present Illness Brendan Butler is a 72 y.o. male presenting with shortness of breath and edema. Pt with colonic mass and underwent open hemicolectomy on 6/27.  PMH:CAD status post multiple stenting, symptomatic bradycardia, atrial flutter, depression, hypertension, hyperlipidemia, OSA, CKD 3A, bronchiectasis, CHF, status post left nephrectomy, ASA allergy    PT Comments  Pt is supervision to mod I for all mobility at  this time, reporting little to no abdominal discomfort with mobility. Pt proficiently navigated steps with supervision for safety, but no physical assist. Pt reporting feeling better, and no longer requires AD for mobility. Pt appropriate to d/c from a mobility standpoint.      Assistance Recommended at Discharge PRN  If plan is discharge home, recommend the following:  Can travel by private vehicle           Equipment Recommendations  None recommended by PT    Recommendations for Other Services       Precautions / Restrictions Precautions Precautions: Fall Precaution Comments: watch O2 Restrictions Weight Bearing Restrictions: No     Mobility  Bed Mobility Overal bed mobility: Modified Independent             General bed mobility comments: increased time and use of bedrail, good log roll technique without cuing    Transfers Overall transfer level: Modified independent   Transfers: Sit to/from Stand Sit to Stand: Modified independent (Device/Increase time)           General transfer comment: increased time, no physical assist    Ambulation/Gait Ambulation/Gait assistance: Supervision Gait Distance (Feet): 800 Feet Assistive device: None Gait Pattern/deviations: Step-through pattern, Trunk flexed Gait velocity: decr     General Gait Details: for safety, min anterior trunk lean but otherwise much  improved even without AD   Stairs Stairs: Yes Stairs assistance: Supervision Stair Management: One rail Right, Step to pattern, Forwards, Alternating pattern Number of Stairs: 8 General stair comments: for safety, cues for step-to pattern but does alternating with ascending and step to with descending   Wheelchair Mobility     Tilt Bed    Modified Rankin (Stroke Patients Only)       Balance Overall balance assessment: Mild deficits observed, not formally tested                                          Cognition Arousal/Alertness: Awake/alert Behavior During Therapy: WFL for tasks assessed/performed Overall Cognitive Status: Within Functional Limits for tasks assessed                                          Exercises      General Comments General comments (skin integrity, edema, etc.): vss, HR <100 throughout      Pertinent Vitals/Pain Pain Assessment Pain Assessment: Faces Faces Pain Scale: Hurts a little bit Pain Location: abdomen Pain Descriptors / Indicators: Discomfort Pain Intervention(s): Monitored during session    Home Living                          Prior Function            PT Goals (current goals can  now be found in the care plan section) Acute Rehab PT Goals Patient Stated Goal: home PT Goal Formulation: With patient Time For Goal Achievement: 09/01/22 Potential to Achieve Goals: Good Progress towards PT goals: Progressing toward goals    Frequency    Min 1X/week      PT Plan Current plan remains appropriate    Co-evaluation              AM-PAC PT "6 Clicks" Mobility   Outcome Measure  Help needed turning from your back to your side while in a flat bed without using bedrails?: None Help needed moving from lying on your back to sitting on the side of a flat bed without using bedrails?: None Help needed moving to and from a bed to a chair (including a wheelchair)?: None Help  needed standing up from a chair using your arms (e.g., wheelchair or bedside chair)?: None Help needed to walk in hospital room?: None Help needed climbing 3-5 steps with a railing? : A Little 6 Click Score: 23    End of Session   Activity Tolerance: Patient limited by pain Patient left: with call bell/phone within reach;in bed Nurse Communication: Mobility status PT Visit Diagnosis: Unsteadiness on feet (R26.81);Muscle weakness (generalized) (M62.81);Difficulty in walking, not elsewhere classified (R26.2);Pain     Time: 1610-9604 PT Time Calculation (min) (ACUTE ONLY): 14 min  Charges:    $Therapeutic Activity: 8-22 mins PT General Charges $$ ACUTE PT VISIT: 1 Visit                     Marye Round, PT DPT Acute Rehabilitation Services Secure Chat Preferred  Office [phone]    Cormick Moss Sheliah Plane 08/27/2022, 4:13 PM

## 2022-08-27 NOTE — Progress Notes (Signed)
   08/27/22 1812  Vitals  BP (!) 110/45  MAP (mmHg) (!) 61  BP Location Left Arm  BP Method Automatic  Patient Position (if appropriate) Lying  Pulse Rate 61  Pulse Rate Source Monitor  ECG Heart Rate (!) 59  Resp 16  MEWS COLOR  MEWS Score Color Green  Oxygen Therapy  SpO2 95 %  O2 Device Room Air  Pain Assessment  Pain Scale 0-10  Pain Score 0  MEWS Score  MEWS Temp 0  MEWS Systolic 0  MEWS Pulse 0  MEWS RR 0  MEWS LOC 0  MEWS Score 0   Patient BP has had significant drop after entresto initiation. MD notified. Verbal order to hold PO lasix.

## 2022-08-27 NOTE — Progress Notes (Signed)
  4 Days Post-Op   Subjective/Chief Complaint: States he is doing well this AM and he wants to go home. Pain controlled. Tolerating PO. Reports multiple loose BMs. Denies nausea or vomiting. States he lives alone but has 3 children in the area who can help him if he needs it. Worked with PT who recommends no follow up.     Objective: Vital signs in last 24 hours: Temp:  [98.3 F (36.8 C)-98.7 F (37.1 C)] 98.5 F (36.9 C) (07/01 0758) Pulse Rate:  [57-70] 58 (07/01 0758) Resp:  [18-20] 18 (07/01 0758) BP: (133-159)/(47-72) 159/50 (07/01 0758) SpO2:  [93 %-95 %] 95 % (07/01 0758) Weight:  [94 kg] 94 kg (07/01 0440) Last BM Date : 08/26/22  Intake/Output from previous day: 06/30 0701 - 07/01 0700 In: 1317 [P.O.:1317] Out: 1100 [Urine:1100] Intake/Output this shift: No intake/output data recorded.  General appearance: alert and cooperative GI: soft, approp ttp, inc c/d/i  with staples - honeycomb removed. Incision with mild surrounding erythema - I do not think this is cellulitis. Skin is soft. No drainage.   Lab Results:  Recent Labs    08/26/22 0040 08/27/22 0033  WBC 11.8* 11.0*  HGB 8.8* 8.5*  HCT 28.6* 27.4*  PLT 187 195   BMET Recent Labs    08/26/22 0040 08/27/22 0033  NA 138 138  K 3.2* 2.9*  CL 99 99  CO2 29 29  GLUCOSE 94 108*  BUN 20 23  CREATININE 1.85* 1.92*  CALCIUM 8.6* 8.5*   Studies/Results: No results found.  Anti-infectives: Anti-infectives (From admission, onward)    Start     Dose/Rate Route Frequency Ordered Stop   08/23/22 0700  cefoTEtan (CEFOTAN) 2 g in sodium chloride 0.9 % 100 mL IVPB        2 g 200 mL/hr over 30 Minutes Intravenous On call to O.R. 08/22/22 2158 08/23/22 0955   08/22/22 2215  neomycin (MYCIFRADIN) tablet 1,000 mg       See Hyperspace for full Linked Orders Report.   1,000 mg Oral Every 2 hours 08/22/22 2158 08/23/22 0405   08/22/22 2215  metroNIDAZOLE (FLAGYL) tablet 1,000 mg       See Hyperspace for full  Linked Orders Report.   1,000 mg Oral Every 2 hours 08/22/22 2158 08/23/22 0204       Assessment/Plan: s/p Procedure(s): LAPAROSCOPIC CONVERTED TO OPEN HEMICOLECTOMY (Right) Dr. Derrell Lolling POD 4 -soft diet, having bowel function. -awaiting path -WBC decreasing  -Hgb 9.7 > 8.8 > 8.5, stable. ok to resume xarelto today from CCS standpoint - continue to mobilize   FEN - soft diet, hypokalemia (2.9) - repleted PO by medicine. Mg is 2.2  VTE - lovenox ID - none needed  PAF CKD CAD CHF HTN HLD  Dispo - stable for discharge from a surgical perspective. Follow up visits for staple removal and with his surgeon have been arranged. We can call him with his final path results.    LOS: 9 days    Adam Phenix PA-C 08/27/2022

## 2022-08-27 NOTE — Progress Notes (Signed)
Mobility Specialist Progress Note:    08/27/22 1142  Mobility  Activity Ambulated with assistance in hallway  Level of Assistance Standby assist, set-up cues, supervision of patient - no hands on  Assistive Device None  Distance Ambulated (ft) 500 ft  Activity Response Tolerated well  Mobility Referral Yes  $Mobility charge 1 Mobility  Mobility Specialist Start Time (ACUTE ONLY) 1133  Mobility Specialist Stop Time (ACUTE ONLY) 1142  Mobility Specialist Time Calculation (min) (ACUTE ONLY) 9 min   Received pt supine in bed having no complaints and agreeable to mobility. Pt was asymptomatic throughout ambulation and returned to room w/o fault. Left sitting EOB, w/ NT in room. Call bell in reach and all needs met.   Moorehead Grayer Mobility Specialist  Please contact vis Secure Chat or  Rehab Office [phone]

## 2022-08-27 NOTE — Progress Notes (Signed)
PROGRESS NOTE    Brendan Butler  ZOX:[id number] DOB: [birth year] DOA: 08/17/2022 PCP: [name], MD    As per prior documentation done by Dr. [name]: "Brendan Butler with history of CAD, multiple PCI and stenting, atrial flutter, depression, hypertension, OSA, CKD 3, bronchiectasis, left nephrectomy presented to the ED with shortness of breath and edema, progressive weight gain and fatigue. -Recently admitted with worsening anemia and positive Hemoccult, transfused PRBC, declined colonoscopy then. -In the ED heart rate 40-50, creatinine 1.9, hemoglobin 7.3, chest x-ray noted mild cardiomegaly. -Admitted, improving with diuretics, blood transfusion -Gastroenterology consulted for iron deficiency anemia, colonoscopy 6/24 noted colon mass, biopsy noted invasive moderate to well-differentiated adeno CA -6/26, surgery consulted"  08/23/2022: Patient underwent open hemicolectomy with ileocolonic anastomosis for right colon mass.  Surgery teams input is highly appreciated.  Patient seems stable postop.  08/25/2022: Patient seen.  Post op day 2.  No new complaints.  Subcutaneous Lovenox to be restarted today.  Full dose anticoagulation when okay with the surgical team.  08/26/2022: Postop day 3.  Patient continues to improve.  Surgery team input is appreciated.  Query resume Xarelto tomorrow i.e. if okay with the surgical team.  08/27/2022: Potassium of 2.9 noted.  Systolic blood pressure was as high as 189 mmHg.  Will continue to replete potassium.  Monitor renal function and electrolytes.  Gradually optimize blood pressure.  DC subcutaneous Lovenox.  Start Xarelto.  Likely discharge in the next 1 to 2 days.  Assessment and Plan:  Colon adenocarcinoma, new diagnosis Iron deficiency anemia -Recent diagnosis, had heme positive stools on admission few weeks ago, declined GI workup then, now amenable -Holding Xarelto [facility] gastroenterology consulted, EGD/colonoscopy 6/25, colon mass noted, biopsy  pending -Transfused 1 unit PRBC, given IV iron -Hemoglobin stable, improving -Request surgery evaluation 08/26/2022: Status post right hemicolectomy (open), with ileocolonic anastomosis.  Patient is stable. 08/27/2022: Stable postop.  Cleared for discharge from surgical point.  Low potassium and hypertensive urgency noted.  See above documentation.  Acute on chronic diastolic CHF -Last echo 6/24 with EF 50%, regional wall motion abnormalities, normal RV -Worsened in the setting of anemia, troponin is normal -Now improving, volume status has improved, he is 5.7 L negative, weight down 13 LB -Changed to oral Lasix, continue Jardiance -CHF TOC follow-up made 08/26/2022: Compensated.  AKI on CKD 3a -Likely hemodynamically mediated, compounded by worsening anemia -Holding Entresto, baseline creatinine around 1.6, now 1.9-2 -Creatinine plateauing around this level 08/23/2022: Baseline serum creatinine is 1.65.  Serum creatinine continues to improve.  Serum creatinine today is 1.96. 08/24/2022: Serum creatinine today is down to 1.77. 08/26/2022: Serum creatinine of 1.85 noted.  Continue to monitor renal function and electrolytes.  Hypokalemia: -Potassium of 3.2 today. -Replete potassium cautiously.  KCl 20 mEq p.o. x 1 dose ordered.  Renal panel in AM. -Continue to monitor renal function and electrolytes. 08/27/2022: Potassium of 2.9.  Continue to replete.  Follow renal panel and electrolytes.  Lung Nodule -Concerning for adeno CA, noted on prior imaging, followed by Dr. Delton Coombes, he missed his appointment for PET scan -This will need to be rescheduled, emphasized with patient regarding importance of this workup  Atrial flutter -With bradycardia, currently on amiodarone,  -Heart rate improved, Xarelto held with colon cancer and GI bleed  CAD HLD > Status post multiple stenting > Does notably have an aspirin allergy - Continue  statin,  - Hold Xarelto as above   Depression - Continue home  bupropion, paroxetine, Seroquel   Hypertension -Holding [employer]  and Aldactone    OSA - Not on CPAP per chart review   DVT prophylaxis: SCDs Code Status: Full code Family Communication: None present Disposition Plan: Home pending ongoing workup  Consultants: Gastroenterology, cards, general surgery  Antimicrobials:    Objective: Vitals:   08/27/22 1139 08/27/22 1157 08/27/22 1337 08/27/22 1649  BP: (!) 189/64 (!) 161/83 (!) 132/57 (!) 122/45  Pulse: 65 69 68 60  Resp: 18 18 16 18   Temp: 97.9 F (36.6 C)   97.9 F (36.6 C)  TempSrc: Oral   Oral  SpO2: 100%   95%  Weight:      Height:        Intake/Output Summary (Last 24 hours) at 08/27/2022 1739 Last data filed at 08/27/2022 0830 Gross per 24 hour  Intake 240 ml  Output 325 ml  Net -85 ml    Filed Weights   08/25/22 0534 08/26/22 0500 08/27/22 0440  Weight: 93 kg 94.1 kg 94 kg    Examination: General condition: Not in any distress.  Awake and alert. HEENT: Patient is pale. Neck: Supple. Lungs: Clear to auscultation. CVS: S1-S2. Abdomen: Obese, soft and nontender.  Surgical wound is noted. Neuro: Awake and alert.  Moves all extremities. Extremities: Mild edema of the upper and lower extremities.   Data Reviewed:   CBC: Recent Labs  Lab 08/22/22 0048 08/23/22 0053 08/24/22 0024 08/26/22 0040 08/27/22 0033  WBC 11.8* 14.5* 14.3* 11.8* 11.0*  NEUTROABS  --   --   --  6.6  --   HGB 9.0* 9.8* 9.7* 8.8* 8.5*  HCT 29.2* 32.8* 31.8* 28.6* 27.4*  MCV 82.0 82.2 83.0 83.9 81.5  PLT 253 271 202 187 195    Basic Metabolic Panel: Recent Labs  Lab 08/23/22 0053 08/24/22 0025 08/26/22 0040 08/27/22 0033 08/27/22 1315  NA 136 140 138 138 139  K 3.2* 4.5 3.2* 2.9* 3.1*  CL 100 105 99 99 101  CO2 25 26 29 29 27   GLUCOSE 93 115* 94 108* 126*  BUN 19 17 20 23  24*  CREATININE 1.96* 1.77* 1.85* 1.92* 1.95*  CALCIUM 8.9 8.4* 8.6* 8.5* 8.6*  MG  --  2.0 2.1 2.2  --   PHOS  --  3.9 3.5 3.2  --      GFR: Estimated Creatinine Clearance: 41.2 mL/min (A) (by C-G formula based on SCr of 1.95 mg/dL (H)). Liver Function Tests: Recent Labs  Lab 08/24/22 0025 08/26/22 0040 08/27/22 0033  ALBUMIN 3.1* 3.0* 2.7*    No results for input(s): "LIPASE", "AMYLASE" in the last 168 hours. No results for input(s): "AMMONIA" in the last 168 hours. Coagulation Profile: No results for input(s): "INR", "PROTIME" in the last 168 hours. Cardiac Enzymes: No results for input(s): "CKTOTAL", "CKMB", "CKMBINDEX", "TROPONINI" in the last 168 hours. BNP (last 3 results) No results for input(s): "PROBNP" in the last 8760 hours. HbA1C: No results for input(s): "HGBA1C" in the last 72 hours. CBG: No results for input(s): "GLUCAP" in the last 168 hours. Lipid Profile: No results for input(s): "CHOL", "HDL", "LDLCALC", "TRIG", "CHOLHDL", "LDLDIRECT" in the last 72 hours. Thyroid Function Tests: No results for input(s): "TSH", "T4TOTAL", "FREET4", "T3FREE", "THYROIDAB" in the last 72 hours.  Anemia Panel: No results for input(s): "VITAMINB12", "FOLATE", "FERRITIN", "TIBC", "IRON", "RETICCTPCT" in the last 72 hours.  Urine analysis:    Component Value Date/Time   COLORURINE AMBER (A) 05/20/2022 2038   APPEARANCEUR CLEAR 05/20/2022 2038   LABSPEC 1.034 (H) 05/20/2022 2038   PHURINE  5.0 05/20/2022 2038   GLUCOSEU NEGATIVE 05/20/2022 2038   HGBUR NEGATIVE 05/20/2022 2038   BILIRUBINUR NEGATIVE 05/20/2022 2038   KETONESUR NEGATIVE 05/20/2022 2038   PROTEINUR NEGATIVE 05/20/2022 2038   NITRITE NEGATIVE 05/20/2022 2038   LEUKOCYTESUR NEGATIVE 05/20/2022 2038   Sepsis Labs: @LABRCNTIP (procalcitonin:4,lacticidven:4)  ) Recent Results (from the past 240 hour(s))  Surgical pcr screen     Status: None   Collection Time: 08/22/22  9:15 PM   Specimen: Nasal Mucosa; Nasal Swab  Result Value Ref Range Status   MRSA, PCR NEGATIVE NEGATIVE Final   Staphylococcus aureus NEGATIVE NEGATIVE Final     Comment: (NOTE) The Xpert SA Assay (FDA approved for NASAL specimens in patients 37 years of age and older), is one component of a comprehensive surveillance program. It is not intended to diagnose infection nor to guide or monitor treatment. Performed at [facility], [address] [place], [place] [address]       Radiology Studies: No results found.   Scheduled Meds:  (feeding supplement) PROSource Plus  30 mL Oral TID BM   sodium chloride   Intravenous Once   acetaminophen  1,000 mg Oral Q6H   amiodarone  100 mg Oral Daily   buPROPion  150 mg Oral Daily   empagliflozin  10 mg Oral Daily   feeding supplement  1 Container Oral TID BM   furosemide  40 mg Oral BID   methocarbamol  500 mg Oral TID   multivitamin with minerals  1 tablet Oral Daily   pantoprazole  40 mg Oral Daily   PARoxetine  30 mg Oral Daily   pregabalin  150 mg Oral TID   QUEtiapine  25 mg Oral QHS   rivaroxaban  15 mg Oral Q supper   rosuvastatin  20 mg Oral Daily   sacubitril-valsartan  1 tablet Oral BID   sodium chloride flush  3 mL Intravenous Q12H   spironolactone  12.5 mg Oral Daily   Continuous Infusions:  sodium chloride 10 mL/hr (08/23/22 1810)   promethazine (PHENERGAN) injection (IM or IVPB) 200 mL/hr at 08/23/22 1248     LOS: 9 days    Time spent:    Berton Mount, MD.   [facility]   08/27/2022, 5:39 PM

## 2022-08-27 NOTE — Progress Notes (Signed)
ANTICOAGULATION CONSULT NOTE - Initial Consult  Pharmacy Consult for Xarelto Indication:  atrial flutter  Allergies  Allergen Reactions   Levitra [Vardenafil] Other (See Comments)    Blurred vision   Aspirin Hives   Lipitor [Atorvastatin] Other (See Comments)    Muscle cramps; patient states he does not take medication at home    Patient Measurements: Height: 5\' 10"  (177.8 cm) Weight: 94 kg (207 lb 3.7 oz) IBW/kg (Calculated) : 73  Vital Signs: Temp: 97.9 F (36.6 C) (07/01 1139) Temp Source: Oral (07/01 1139) BP: 161/83 (07/01 1157) Pulse Rate: Brendan (07/01 1157)  Labs: Recent Labs    08/26/22 0040 08/27/22 0033  HGB 8.8* 8.5*  HCT 28.6* 27.4*  PLT 187 195  CREATININE 1.85* 1.92*    Estimated Creatinine Clearance: 41.8 mL/min (A) (by C-G formula based on SCr of 1.92 mg/dL (H)).   Medical History: Past Medical History:  Diagnosis Date   Acute blood loss anemia 07/30/2022   Acute cholecystitis 12/25/2021   Altered mental status 05/06/2022   Atrial flutter with rapid ventricular response (HCC) 05/06/2022   Basal ganglia infarction (HCC)    Bradycardia    CAD (coronary artery disease)    Chronic HFrEF (heart failure with reduced ejection fraction) (HCC)    Chronic kidney disease, stage 3 (HCC)    Erectile dysfunction    Hyperlipidemia LDL goal <70    Hypertension    OSA (obstructive sleep apnea)    No CPAP use overnight   PAF (paroxysmal atrial fibrillation) (HCC)    Paroxysmal atrial flutter (HCC)    Premature atrial contractions    Pulmonary nodules     Medications:  Medications Prior to Admission  Medication Sig Dispense Refill Last Dose   amiodarone (PACERONE) 200 MG tablet Take 1 tablet (200 mg total) by mouth daily. 90 tablet 3 08/16/2022   buPROPion (WELLBUTRIN XL) 150 MG 24 hr tablet Take 1 tablet (150 mg total) by mouth daily. 30 tablet 2 08/16/2022   Cyanocobalamin (VITAMIN B-12 IJ) Inject as directed every 30 (thirty) days.   Past Month    empagliflozin (JARDIANCE) 10 MG TABS tablet Take 1 tablet (10 mg total) by mouth daily. 90 tablet 3 08/16/2022   fluticasone (FLONASE) 50 MCG/ACT nasal spray Place 1 spray into both nostrils daily as needed for allergies.   Past Week   furosemide (LASIX) 40 MG tablet Take 1 tablet (40 mg total) by mouth daily. 90 tablet 3 08/16/2022   pantoprazole (PROTONIX) 40 MG tablet Take 1 tablet (40 mg total) by mouth daily. 30 tablet 0 08/16/2022   PARoxetine (PAXIL) 30 MG tablet Take 30 mg by mouth daily.   08/16/2022   potassium chloride (KLOR-CON M) 10 MEQ tablet Take 1 tablet (10 mEq total) by mouth daily as needed (take wtih lasix only).   08/16/2022   pregabalin (LYRICA) 150 MG capsule Take 1 capsule (150 mg total) by mouth 2 (two) times daily. (Patient taking differently: Take 150 mg by mouth 3 (three) times daily.)   08/16/2022   QUEtiapine (SEROQUEL) 25 MG tablet Take 1 tablet (25 mg total) by mouth at bedtime. 30 tablet 0 08/16/2022   rivaroxaban (XARELTO) 20 MG TABS tablet Take 1 tablet (20 mg total) by mouth daily with supper. 90 tablet 3 08/16/2022 at 1800   rosuvastatin (CRESTOR) 20 MG tablet Take 1 tablet (20 mg total) by mouth daily. 90 tablet 3 08/16/2022   sacubitril-valsartan (ENTRESTO) 24-26 MG Take 1 tablet by mouth 2 (two) times daily. 180  tablet 3 08/16/2022   spironolactone (ALDACTONE) 25 MG tablet Take 1/2 tablet (12.5 mg total) by mouth daily. 45 tablet 3 08/16/2022   docusate sodium (COLACE) 100 MG capsule Take 1 capsule (100 mg total) by mouth 2 (two) times daily. (Patient not taking: Reported on 08/17/2022) 60 capsule 0 Not Taking   ferrous sulfate 325 (65 FE) MG EC tablet Take 1 tablet (325 mg total) by mouth 2 (two) times daily. (Patient not taking: Reported on 08/17/2022) 30 tablet 0 Not Taking    Assessment: Brendan Butler on Xarelto PTA for atrial flutter admitted with worsening anemia. He had a colonoscopy on 6/24 which showed a colon mass and is s/p open right hemicolectomy on 6/27. Pharmacy  consulted to resume Xarelto. His SCr is 1.92 and above baseline ~1.5-1.6 making CrCl <50 ml/min so his dose will be adjusted. No bleeding noted, Hgb low stable 8s, platelets are normal.   Goal of Therapy:  Full anticoagulation Monitor platelets by anticoagulation protocol: Yes   Plan:  Xarelto 15 mg PO daily with supper F/u ability to increase Xarelto to 20 mg as PTA Monitor for s/sx of bleeding   Thank you for involving pharmacy in this patient's care.  Loura Back, PharmD, BCPS Clinical Pharmacist Clinical phone for 08/27/2022 is [phone] 08/27/2022 1:33 PM

## 2022-08-28 ENCOUNTER — Other Ambulatory Visit (HOSPITAL_COMMUNITY): Payer: Self-pay

## 2022-08-28 ENCOUNTER — Telehealth (HOSPITAL_COMMUNITY): Payer: Self-pay | Admitting: Pharmacy Technician

## 2022-08-28 ENCOUNTER — Other Ambulatory Visit: Payer: Self-pay

## 2022-08-28 DIAGNOSIS — D649 Anemia, unspecified: Secondary | ICD-10-CM | POA: Diagnosis not present

## 2022-08-28 LAB — RENAL FUNCTION PANEL
Albumin: 2.7 g/dL — ABNORMAL LOW (ref 3.5–5.0)
Anion gap: 7 (ref 5–15)
BUN: 25 mg/dL — ABNORMAL HIGH (ref 8–23)
CO2: 28 mmol/L (ref 22–32)
Calcium: 8.3 mg/dL — ABNORMAL LOW (ref 8.9–10.3)
Chloride: 104 mmol/L (ref 98–111)
Creatinine, Ser: 1.76 mg/dL — ABNORMAL HIGH (ref 0.61–1.24)
GFR, Estimated: 41 mL/min — ABNORMAL LOW (ref >60)
Glucose, Bld: 101 mg/dL — ABNORMAL HIGH (ref 70–99)
Phosphorus: 2.8 mg/dL (ref 2.5–4.6)
Potassium: 3.9 mmol/L (ref 3.5–5.1)
Sodium: 139 mmol/L (ref 135–145)

## 2022-08-28 LAB — SURGICAL PATHOLOGY

## 2022-08-28 LAB — MAGNESIUM: Magnesium: 2.4 mg/dL (ref 1.7–2.4)

## 2022-08-28 MED ORDER — METHOCARBAMOL 500 MG PO TABS
500.0000 mg | ORAL_TABLET | Freq: Three times a day (TID) | ORAL | 0 refills | Status: DC | PRN
  Filled 2022-08-28: qty 60, 20d supply, fill #0

## 2022-08-28 MED ORDER — OXYCODONE HCL 5 MG PO TABS
5.0000 mg | ORAL_TABLET | Freq: Four times a day (QID) | ORAL | 0 refills | Status: DC | PRN
  Filled 2022-08-28: qty 12, 3d supply, fill #0

## 2022-08-28 MED ORDER — FUROSEMIDE 40 MG PO TABS
40.0000 mg | ORAL_TABLET | Freq: Two times a day (BID) | ORAL | 0 refills | Status: DC
  Filled 2022-08-28: qty 60, 30d supply, fill #0

## 2022-08-28 MED ORDER — ACETAMINOPHEN 500 MG PO TABS
1000.0000 mg | ORAL_TABLET | Freq: Four times a day (QID) | ORAL | 0 refills | Status: DC
  Filled 2022-08-28: qty 100, 13d supply, fill #0

## 2022-08-28 MED ORDER — POTASSIUM CHLORIDE CRYS ER 20 MEQ PO TBCR
40.0000 meq | EXTENDED_RELEASE_TABLET | Freq: Every day | ORAL | 0 refills | Status: DC
  Filled 2022-08-28: qty 60, 30d supply, fill #0

## 2022-08-28 MED ORDER — BOOST PLUS PO LIQD
237.0000 mL | Freq: Two times a day (BID) | ORAL | Status: DC
  Administered 2022-08-28 (×2): 237 mL via ORAL
  Filled 2022-08-28 (×2): qty 237

## 2022-08-28 MED ORDER — ALPRAZOLAM 0.25 MG PO TABS
0.2500 mg | ORAL_TABLET | Freq: Two times a day (BID) | ORAL | 0 refills | Status: DC | PRN
  Filled 2022-08-28: qty 10, 5d supply, fill #0

## 2022-08-28 MED ORDER — AMIODARONE HCL 200 MG PO TABS
100.0000 mg | ORAL_TABLET | Freq: Every day | ORAL | 0 refills | Status: DC
  Filled 2022-08-28: qty 15, 30d supply, fill #0

## 2022-08-28 MED ORDER — POLYETHYLENE GLYCOL 3350 17 GM/SCOOP PO POWD
17.0000 g | Freq: Every day | ORAL | 0 refills | Status: DC | PRN
  Filled 2022-08-28: qty 238, 14d supply, fill #0

## 2022-08-28 MED ORDER — ADULT MULTIVITAMIN W/MINERALS CH
1.0000 | ORAL_TABLET | Freq: Every day | ORAL | 0 refills | Status: DC
  Filled 2022-08-28: qty 130, 130d supply, fill #0

## 2022-08-28 MED ORDER — RIVAROXABAN 15 MG PO TABS
15.0000 mg | ORAL_TABLET | Freq: Every day | ORAL | 0 refills | Status: DC
  Filled 2022-08-28: qty 30, 30d supply, fill #0

## 2022-08-28 MED ORDER — BOOST PLUS PO LIQD
237.0000 mL | Freq: Two times a day (BID) | ORAL | 0 refills | Status: DC
  Filled 2022-08-28 – 2022-09-27 (×2): qty 237, 1d supply, fill #0

## 2022-08-28 NOTE — Telephone Encounter (Signed)
Pharmacy Patient Advocate Encounter   Received notification that prior authorization for ALPRAZolam 0.25MG  tablets is required/requested.    PA submitted to [facility] via CoverMyMeds Key/confirmation #/EOC [id number] Status is pending

## 2022-08-28 NOTE — Telephone Encounter (Signed)
Pharmacy Patient Advocate Encounter  Received notification from [facility] that Prior Authorization for oxyCODONE HCl 5MG  tablets  has been APPROVED from 08/28/2022 to 01/27/2023.Marland Kitchen  PA #/Case ID/Reference #: [id number]

## 2022-08-28 NOTE — Care Management Important Message (Signed)
Important Message  Patient Details  Name: Brendan Butler MRN: [id number] Date of Birth: Jun 24, 1950   Medicare Important Message Given:  Yes     Renie Ora 08/28/2022, 10:45 AM

## 2022-08-28 NOTE — TOC Transition Note (Signed)
Transition of Care [facility]) - CM/SW Discharge Note   Patient Details  Name: Brendan Butler MRN: [id number] Date of Birth: Oct 17, 1950  Transition of Care [facility]) CM/SW Contact:  Leone Haven, RN Phone Number: 08/28/2022, 11:14 AM   Clinical Narrative:    Patient may be for possible dc today, he is active with [facility] for [facility], he has transport at dc.    Final next level of care: Home w Home Health Services Barriers to Discharge: Continued Medical Work up   Patient Goals and CMS Choice CMS Medicare.gov Compare Post Acute Care list provided to:: Patient Choice offered to / list presented to : Patient  Discharge Placement                         Discharge Plan and Services Additional resources added to the After Visit Summary for   In-house Referral: NA Discharge Planning Services: CM Consult Post Acute Care Choice: Resumption of Svcs/PTA Provider          DME Arranged: N/A DME Agency: NA       HH Arranged: RN, Disease Management HH Agency: [facility] Date [facility] Agency Contacted: 08/20/22 Time HH Agency Contacted: 1609 Representative spoke with at [facility] Agency: Kandee Keen  Social Determinants of Health (SDOH) Interventions SDOH Screenings   Food Insecurity: No Food Insecurity (08/20/2022)  Housing: Low Risk  (08/20/2022)  Transportation Needs: No Transportation Needs (08/20/2022)  Utilities: Not At Risk (08/24/2022)  Alcohol Screen: Low Risk  (08/20/2022)  Financial Resource Strain: Low Risk  (08/20/2022)  Tobacco Use: High Risk (08/24/2022)     Readmission Risk Interventions     No data to display

## 2022-08-28 NOTE — Discharge Summary (Signed)
Physician Discharge Summary   Patient: Brendan Butler MRN: [id number] DOB: 09/05/1950  Admit date:     08/17/2022  Discharge date: 08/28/22  Discharge Physician: Fran Lowes   PCP: [name], MD   Recommendations at discharge:    Discharge to home with home health (RN/PT/OT). Chemistry to be drawn on 09/01/2022 and reported to PCP. Pt to follow up with general surgery as directed. Pt to follow up with PCP in 7-10 days. Chemistry is to be drawn at that visit to be reported to PCP. Pt to reschedule PET scan as outpatient.  Discharge Diagnoses: Principal Problem:   Symptomatic anemia Active Problems:   Bronchiectasis (HCC)   Dyslipidemia   Essential hypertension   Coronary artery disease involving native coronary artery of native heart without angina pectoris   OSA on CPAP   MDD (major depressive disorder), recurrent episode, mild (HCC)   Persistent atrial fibrillation (HCC)  Resolved Problems:   * No resolved hospital problems. [facility] Hospital Course: As per prior documentation done by Dr. [name]: "72/M with history of CAD, multiple PCI and stenting, atrial flutter, depression, hypertension, OSA, CKD 3, bronchiectasis, left nephrectomy presented to the ED with shortness of breath and edema, progressive weight gain and fatigue. -Recently admitted with worsening anemia and positive Hemoccult, transfused PRBC, declined colonoscopy then. -In the ED heart rate 40-50, creatinine 1.9, hemoglobin 7.3, chest x-ray noted mild cardiomegaly. -Admitted, improving with diuretics, blood transfusion -Gastroenterology consulted for iron deficiency anemia, colonoscopy 6/24 noted colon mass, biopsy noted invasive moderate to well-differentiated adeno CA -6/26, surgery consulted"   08/23/2022: Patient underwent open hemicolectomy with ileocolonic anastomosis for right colon mass.  Surgery teams input is highly appreciated.  Patient seems stable postop.   08/25/2022: Patient seen.  Post op day 2.   No new complaints.  Subcutaneous Lovenox to be restarted today.  Full dose anticoagulation when okay with the surgical team.   08/26/2022: Postop day 3.  Patient continues to improve.  Surgery team input is appreciated.  Query resume Xarelto tomorrow i.e. if okay with the surgical team.   08/27/2022: Potassium of 2.9 noted.  Systolic blood pressure was as high as 189 mmHg.  Will continue to replete potassium.  Monitor renal function and electrolytes.  Gradually optimize blood pressure.  DC subcutaneous Lovenox.  Start Xarelto.  Likely discharge in the next 1 to 2 days.  Assessment and Plan: Colon adenocarcinoma, new diagnosis Iron deficiency anemia -Recent diagnosis, had heme positive stools on admission few weeks ago, declined GI workup then, now amenable -Holding Xarelto [facility] gastroenterology consulted, EGD/colonoscopy 6/25, colon mass noted, biopsy pending -Transfused 1 unit PRBC, given IV iron -Hemoglobin stable, improving -Request surgery evaluation 08/26/2022: Status post right hemicolectomy (open), with ileocolonic anastomosis.  Patient is stable. 08/27/2022: Stable postop.  Cleared for discharge from surgical point.  Low potassium and hypertensive urgency noted.   08/28/2022: Patient is appropriate for discharge to home with home health. See above documentation.   Acute on chronic diastolic CHF -Last echo 6/24 with EF 50%, regional wall motion abnormalities, normal RV -Worsened in the setting of anemia, troponin is normal -Now improving, volume status has improved, he is 5.7 L negative, weight down 13 LB -Changed to oral Lasix, continue Jardiance -CHF TOC follow-up made 08/26/2022: Compensated. 08/28/2022: BP stable after Entresto and lasix. Discharge to home.   AKI on CKD 3a -Likely hemodynamically mediated, compounded by worsening anemia -Holding Entresto, baseline creatinine around 1.6, now 1.9-2 -Creatinine plateauing around this level 08/23/2022: Baseline serum  creatinine is 1.65.   Serum creatinine continues to improve.  Serum creatinine today is 1.96. 08/24/2022: Serum creatinine today is down to 1.77. 08/26/2022: Serum creatinine of 1.85 noted.  Continue to monitor renal function and electrolytes. 08/28/2022: Creatinine is improved to 1.76. Discharge to home. Chemistry to be repeated on 09/01/2022 and reported to PCP.   Hypokalemia: -Potassium of 3.2 today. -Replete potassium cautiously.  KCl 20 mEq p.o. x 1 dose ordered.  Renal panel in AM. -Continue to monitor renal function and electrolytes. 08/27/2022: Potassium of 2.9.  Continue to replete.  Follow renal panel and electrolytes. 08/28/2022: Potassium is corrected to 3.9 this am. He will be discharged home on 40 mEq daily of KCL. Chemistry to be repeated on 09/01/2022 to be reported to PCP and upon follow up visit with PCP.   Lung Nodule -Concerning for adeno CA, noted on prior imaging, followed by Dr. Delton Coombes, he missed his appointment for PET scan -This will need to be rescheduled, emphasized with patient regarding importance of this workup   Atrial flutter -With bradycardia, currently on amiodarone,  -Heart rate improved, Xarelto held with colon cancer and GI bleed   CAD HLD > Status post multiple stenting > Does notably have an aspirin allergy - Continue  statin,  - Hold Xarelto as above   Depression - Continue home bupropion, paroxetine, Seroquel   Hypertension -Holding Entresto and Aldactone    OSA - Not on CPAP per chart review     DVT prophylaxis: SCDs Code Status: Full code Family Communication: None present Disposition Plan: Home pending ongoing workup   Consultants: Gastroenterology, cards, general surgery          Consultants: Gastroenterology, cards, general surgery Procedures performed: EGD, Colonoscopy, Right hemicolectomy with anastamosis.  Disposition: Home health Diet recommendation:  Discharge Diet Orders (From admission, onward)     Start     Ordered   08/28/22 0000  Diet -  low sodium heart healthy       Comments: Soft.   08/28/22 1423           Cardiac diet DISCHARGE MEDICATION: Allergies as of 08/28/2022       Reactions   Levitra [vardenafil] Other (See Comments)   Blurred vision   Aspirin Hives   Lipitor [atorvastatin] Other (See Comments)   Muscle cramps; patient states he does not take medication at home        Medication List     STOP taking these medications    docusate sodium 100 MG capsule Commonly known as: Colace   VITAMIN B-12 IJ       TAKE these medications    acetaminophen 500 MG tablet Commonly known as: TYLENOL Take 2 tablets (1,000 mg total) by mouth every 6 (six) hours.   ALPRAZolam 0.25 MG tablet Commonly known as: XANAX Take 1 tablet (0.25 mg total) by mouth 2 (two) times daily as needed for anxiety.   amiodarone 100 MG tablet Commonly known as: PACERONE Take 1 tablet (100 mg total) by mouth daily. Start taking on: August 29, 2022 What changed:  medication strength how much to take   buPROPion 150 MG 24 hr tablet Commonly known as: WELLBUTRIN XL Take 1 tablet (150 mg total) by mouth daily.   empagliflozin 10 MG Tabs tablet Commonly known as: JARDIANCE Take 1 tablet (10 mg total) by mouth daily.   ferrous sulfate 325 (65 FE) MG EC tablet Take 1 tablet (325 mg total) by mouth 2 (two) times daily.   fluticasone  50 MCG/ACT nasal spray Commonly known as: FLONASE Place 1 spray into both nostrils daily as needed for allergies.   furosemide 40 MG tablet Commonly known as: LASIX Take 1 tablet (40 mg total) by mouth 2 (two) times daily. What changed: when to take this   lactose free nutrition Liqd Take 237 mLs by mouth 2 (two) times daily between meals. Start taking on: August 29, 2022   methocarbamol 500 MG tablet Commonly known as: ROBAXIN Take 1 tablet (500 mg total) by mouth every 8 (eight) hours as needed for muscle spasms.   multivitamin with minerals Tabs tablet Take 1 tablet by mouth  daily. Start taking on: August 29, 2022   oxyCODONE 5 MG immediate release tablet Commonly known as: Oxy IR/ROXICODONE Take 1 tablet (5 mg total) by mouth every 6 (six) hours as needed for moderate pain.   pantoprazole 40 MG tablet Commonly known as: PROTONIX Take 1 tablet (40 mg total) by mouth daily.   PARoxetine 30 MG tablet Commonly known as: PAXIL Take 30 mg by mouth daily.   polyethylene glycol 17 g packet Commonly known as: MIRALAX / GLYCOLAX Take 17 g by mouth daily as needed for mild constipation.   potassium chloride SA 20 MEQ tablet Commonly known as: KLOR-CON M Take 2 tablets (40 mEq total) by mouth daily. What changed:  medication strength how much to take when to take this reasons to take this   pregabalin 150 MG capsule Commonly known as: LYRICA Take 1 capsule (150 mg total) by mouth 2 (two) times daily. What changed: when to take this   QUEtiapine 25 MG tablet Commonly known as: SEROQUEL Take 1 tablet (25 mg total) by mouth at bedtime.   Rivaroxaban 15 MG Tabs tablet Commonly known as: XARELTO Take 1 tablet (15 mg total) by mouth daily with supper. What changed:  medication strength how much to take   rosuvastatin 20 MG tablet Commonly known as: CRESTOR Take 1 tablet (20 mg total) by mouth daily.   sacubitril-valsartan 24-26 MG Commonly known as: ENTRESTO Take 1 tablet by mouth 2 (two) times daily.   spironolactone 25 MG tablet Commonly known as: ALDACTONE Take 1/2 tablet (12.5 mg total) by mouth daily.        Follow-up Information     [facility]. Go in 20 day(s).   Specialty: Cardiology Why: Hospital follow up 09/10/2022 @ 11 am PLEASE bring a current medication list to appointment FREE valet parking, Entrance C, off [address] information: [address] [id number] mc [place] [address] [phone]        [facility], [facility] Follow up.    Specialty: Home Health Services Why: Agency will call you to set up apt times Contact information: [address] [place] [address] [phone]         Marjie Skiff E, PA-C Follow up on 09/03/2022.   Specialty: Cardiology Why: at 11:20 AM for hospital follow up please note address Contact information: [address] [place] [address] [phone]         Axel Filler, MD. Go on 09/24/2022.   Specialty: General Surgery Why: at 9 :00 AM for post-operative follow up. pleasearrive 20 minutes early. Contact information: [address] [place] [address] [phone]         [facility]. Go on 09/06/2022.   Specialty: General Surgery Why: For staple removal. call to confirm appointment time.  Contact information: [address] [place] [address] [phone]         [name], MD. Go on 09/06/2022.   Specialty: Internal Medicine Why: @3 :30pm Contact information: [address] [place] [address] [phone]                Discharge Exam: Ceasar Mons Weights   08/26/22 0500 08/27/22 0440 08/28/22 0424  Weight: 94.1 kg 94 kg 92 kg   Exam:  Constitutional:  The patient is awake, alert, and oriented x 3. No acute distress. Respiratory:  No increased work of breathing. No wheezes, rales, or rhonchi No tactile fremitus Cardiovascular:  Regular rate and rhythm No murmurs, ectopy, or gallups. No lateral PMI. No thrills. Abdomen:  Abdomen is soft, non-tender, non-distended No hernias, masses, or organomegaly Normoactive bowel sounds.  Musculoskeletal:  No cyanosis, clubbing, or edema Skin:  No rashes, lesions, ulcers palpation of skin: no induration or nodules Neurologic:  CN 2-12 intact Sensation all 4 extremities intact Psychiatric:  Mental status Mood, affect appropriate Orientation to person, place, time  judgment and insight appear  intact   Condition at discharge: fair  The results of significant diagnostics from this hospitalization (including imaging, microbiology, ancillary and laboratory) are listed below for reference.   Imaging Studies: CT CHEST ABDOMEN PELVIS WO CONTRAST  Result Date: 08/22/2022 CLINICAL DATA:  Colon cancer staging.  * Tracking Code: BO * EXAM: CT CHEST, ABDOMEN AND PELVIS WITHOUT CONTRAST TECHNIQUE: Multidetector CT imaging of the chest, abdomen and pelvis was performed following the standard protocol without IV contrast. RADIATION DOSE REDUCTION: This exam was performed according to the departmental dose-optimization program which includes automated exposure control, adjustment of the mA and/or kV according to patient size and/or use of iterative reconstruction technique. COMPARISON:  CT 05/20/2022 chest abdomen pelvis. FINDINGS: CT CHEST FINDINGS Cardiovascular: Heart is nonenlarged. No pericardial effusion. Coronary artery calcifications are seen. The thoracic aorta on this non IV contrast exam has a normal course and caliber with scattered vascular calcifications. Mediastinum/Nodes: No specific abnormal lymph node enlargement identified in the axillary regions, hilum or mediastinum. A few small less than 1 cm in size in short axis lymph nodes are seen, nonpathologic by size criteria. Slightly patulous thoracic esophagus with small hiatal hernia. Preserved thyroid gland. Lungs/Pleura: Bilateral small upper lobe air cysts identified. There are some dependent atelectasis with some scarring and subtle ground-glass. There is metallic marker in the right lower lobe on series 4, image 106. Minimal areas of ground-glass in the upper lobes as well. No pneumothorax, effusion or consolidation. The tiny right effusion is no longer identified. No new discrete dominant lung nodule. Musculoskeletal: Some interval healing of the previously seen posterior right lower rib fractures. Pins along the left shoulder.  Degenerative changes of the spine. There is moderate compression deformity of L1 along the superior endplate with some kyphosis and sclerosis. This is unchanged from the study of March 2024. CT ABDOMEN PELVIS FINDINGS Hepatobiliary: Evaluation for solid organ pathology including metastatic disease is limited without the advantage of IV contrast. Liver is grossly preserved. Previous cholecystectomy. Pancreas: Unremarkable. No pancreatic ductal dilatation or surrounding inflammatory changes. Spleen: Normal in size without focal abnormality. Adrenals/Urinary Tract: Adrenal glands are preserved. Parapelvic lower pole right-sided renal cysts. No right renal or ureteral stone. No collecting system dilatation on the right. Left kidney is surgically absent with surgical clips in the nephrectomy bed. Bladder is underdistended. Stomach/Bowel: Oral contrast was administered. Normal appendix in the right  lower quadrant. Large bowel has a normal course and caliber with scattered stool and diverticula. There is some debris in the stomach. Small bowel is nondilated. Vascular/Lymphatic: Aortic atherosclerosis. No enlarged abdominal or pelvic lymph nodes. Reproductive: Prostate is unremarkable. Other: No free air or free fluid. Musculoskeletal: Scattered degenerative changes of the spine and pelvis. There are some disc bulging and stenosis identified along the lower lumbar spine at L4-5. IMPRESSION: Surgical changes of prior left nephrectomy. No recurrent mass lesion, fluid collection or lymph node enlargement in the chest, abdomen or pelvis. Improving right tiny pleural effusion. Colonic diverticula.  No bowel obstruction. Stable compression of L1.  Healing right-sided rib fractures. Evaluation for malignancy and solid organ pathology is limited without the advantage of IV contrast Electronically Signed   By: Karen Kays M.D.   On: 08/22/2022 16:04   ECHOCARDIOGRAM LIMITED  Result Date: 08/18/2022    ECHOCARDIOGRAM REPORT    Patient Name:   Brendan Butler Date of Exam: 08/17/2022 Medical Rec #:  [id number]        Height:       70.0 in Accession #:    [id number]       Weight:       216.5 lb Date of Birth:  03-Aug-1950       BSA:          2.159 m Patient Age:    71 years         BP:           147/53 mmHg Patient Gender: M                HR:           46 bpm. Exam Location:  Inpatient Procedure: Limited Echo, Color Doppler and Cardiac Doppler Indications:    dyspnea  History:        Patient has prior history of Echocardiogram examinations. CHF,                 CAD, chronic kidney disease; Risk Factors:Hypertension,                 Dyslipidemia and Sleep Apnea.  Sonographer:    Delcie Roch RDCS Referring Phys: [id number] Cecille Po MELVIN IMPRESSIONS  1. Left ventricular ejection fraction, by estimation, is 50%. The left ventricle has low normal function. The left ventricle demonstrates regional wall motion abnormalities (see scoring diagram/findings for description). Left ventricular diastolic parameters were grossly normal.  2. Right ventricular systolic function is normal. The right ventricular size is normal. There is normal pulmonary artery systolic pressure. The estimated right ventricular systolic pressure is 20.8 mmHg.  3. The mitral valve is grossly normal. Trivial mitral valve regurgitation.  4. The aortic valve is grossly normal. Aortic valve regurgitation is trivial. No aortic stenosis is present.  5. The inferior vena cava is normal in size with greater than 50% respiratory variability, suggesting right atrial pressure of 3 mmHg. FINDINGS  Left Ventricle: Left ventricular ejection fraction, by estimation, is 50%. The left ventricle has low normal function. The left ventricle demonstrates regional wall motion abnormalities. The left ventricular internal cavity size was normal in size. There is no left ventricular hypertrophy. Left ventricular diastolic parameters were normal.  LV Wall Scoring: The mid anteroseptal segment,  mid inferoseptal segment, and basal inferoseptal segment are hypokinetic. Right Ventricle: The right ventricular size is normal. No increase in right ventricular wall thickness. Right ventricular systolic function is normal. There is normal pulmonary artery systolic pressure.  The tricuspid regurgitant velocity is 2.11 m/s, and  with an assumed right atrial pressure of 3 mmHg, the estimated right ventricular systolic pressure is 20.8 mmHg. Left Atrium: Left atrial size was normal in size. Right Atrium: Right atrial size was normal in size. Pericardium: There is no evidence of pericardial effusion. Mitral Valve: The mitral valve is grossly normal. Trivial mitral valve regurgitation. Tricuspid Valve: The tricuspid valve is grossly normal. Tricuspid valve regurgitation is trivial. Aortic Valve: The aortic valve is grossly normal. Aortic valve regurgitation is trivial. No aortic stenosis is present. Pulmonic Valve: The pulmonic valve was grossly normal. Pulmonic valve regurgitation is trivial. Aorta: The aortic root is normal in size and structure. Venous: The inferior vena cava is normal in size with greater than 50% respiratory variability, suggesting right atrial pressure of 3 mmHg. IAS/Shunts: The interatrial septum was not assessed.  LEFT VENTRICLE PLAX 2D LVIDd:         6.10 cm      Diastology LVIDs:         4.30 cm      LV e' medial:    7.18 cm/s LV PW:         1.00 cm      LV E/e' medial:  12.3 LV IVS:        0.90 cm      LV e' lateral:   12.50 cm/s LVOT diam:     2.20 cm      LV E/e' lateral: 7.0 LV SV:         97 LV SV Index:   45 LVOT Area:     3.80 cm  LV Volumes (MOD) LV vol d, MOD A4C: 118.0 ml LV vol s, MOD A4C: 66.5 ml LV SV MOD A4C:     118.0 ml IVC IVC diam: 1.20 cm LEFT ATRIUM         Index LA diam:    4.60 cm 2.13 cm/m  AORTIC VALVE LVOT Vmax:   104.00 cm/s LVOT Vmean:  66.600 cm/s LVOT VTI:    0.254 m  AORTA Ao Asc diam: 3.60 cm MITRAL VALVE               TRICUSPID VALVE MV Area (PHT): 3.20 cm     TR Peak grad:   17.8 mmHg MV Decel Time: 237 msec    TR Vmax:        211.00 cm/s MV E velocity: 88.00 cm/s MV A velocity: 88.00 cm/s  SHUNTS MV E/A ratio:  1.00        Systemic VTI:  0.25 m                            Systemic Diam: 2.20 cm Weston Brass MD Electronically signed by Weston Brass MD Signature Date/Time: 08/18/2022/6:33:02 AM    Final    VAS [place] ABI WITH/WO TBI  Result Date: 08/17/2022  LOWER EXTREMITY DOPPLER STUDY Patient Name:  Brendan Butler  Date of Exam:   08/17/2022 Medical Rec #: [id number]         Accession #:    [id number] Date of Birth: 1950/07/10        Patient Gender: M Patient Age:   25 years Exam Location:  [facility] Procedure:      VAS [place] ABI WITH/WO TBI Referring Phys: Duwayne Heck RAY --------------------------------------------------------------------------------  Indications: Rest pain. High Risk Factors: Hypertension, hyperlipidemia, coronary artery disease.  Comparison Study: No previous study.  Performing Technologist: McKayla Maag RVT, [place]  Examination Guidelines: A complete evaluation includes at minimum, Doppler waveform signals and systolic blood pressure reading at the level of bilateral brachial, anterior tibial, and posterior tibial arteries, when vessel segments are accessible. Bilateral testing is considered an integral part of a complete examination. Photoelectric Plethysmograph (PPG) waveforms and toe systolic pressure readings are included as required and additional duplex testing as needed. Limited examinations for reoccurring indications may be performed as noted.  ABI Findings: +---------+------------------+-----+-----------+-------------------------------+ Right    Rt Pressure (mmHg)IndexWaveform   Comment                         +---------+------------------+-----+-----------+-------------------------------+ Brachial                        triphasic  No pressure obtained due to IV                                             placement                        +---------+------------------+-----+-----------+-------------------------------+ PTA      146               1.24 multiphasic                                +---------+------------------+-----+-----------+-------------------------------+ DP       143               1.21 multiphasic                                +---------+------------------+-----+-----------+-------------------------------+ Great Toe99                0.84 Normal                                     +---------+------------------+-----+-----------+-------------------------------+ +---------+------------------+-----+-----------+-------+ Left     Lt Pressure (mmHg)IndexWaveform   Comment +---------+------------------+-----+-----------+-------+ Brachial 118                    triphasic          +---------+------------------+-----+-----------+-------+ PTA      121               1.03 multiphasic        +---------+------------------+-----+-----------+-------+ DP       121               1.03 multiphasic        +---------+------------------+-----+-----------+-------+ Great Toe99                0.84 Normal             +---------+------------------+-----+-----------+-------+ +-------+-----------+-----------+------------+------------+ ABI/TBIToday's ABIToday's TBIPrevious ABIPrevious TBI +-------+-----------+-----------+------------+------------+ Right  1.24       0.84                                +-------+-----------+-----------+------------+------------+ Left   1.03       0.84                                +-------+-----------+-----------+------------+------------+  Summary: Right: Resting right ankle-brachial index is within normal range. The right toe-brachial index is normal. Left: Resting left ankle-brachial index is within normal range. The left toe-brachial index is normal. *See table(s) above for measurements and observations.  Electronically signed by Waverly Ferrari MD on 08/17/2022 at 5:06:18 PM.    Final    VAS [place] LOWER EXTREMITY VENOUS (DVT) (7a-7p)  Result Date: 08/17/2022  Lower Venous DVT Study Patient Name:  Brendan Butler  Date of Exam:   08/17/2022 Medical Rec #: [id number]         Accession #:    [id number] Date of Birth: [birth year]        Patient Gender: M Patient Age:   26 years Exam Location:  [facility] Procedure:      VAS [place] LOWER EXTREMITY VENOUS (DVT) Referring Phys: Duwayne Heck RAY --------------------------------------------------------------------------------  Indications: Swelling.  Comparison Study: Previous study 07/30/22 negative. Performing Technologist: McKayla Maag RVT, [place]  Examination Guidelines: A complete evaluation includes B-mode imaging, spectral Doppler, color Doppler, and power Doppler as needed of all accessible portions of each vessel. Bilateral testing is considered an integral part of a complete examination. Limited examinations for reoccurring indications may be performed as noted. The reflux portion of the exam is performed with the patient in reverse Trendelenburg.  +---------+---------------+---------+-----------+----------+--------------+ RIGHT    CompressibilityPhasicitySpontaneityPropertiesThrombus Aging +---------+---------------+---------+-----------+----------+--------------+ CFV      Full           Yes      Yes                                 +---------+---------------+---------+-----------+----------+--------------+ SFJ      Full                                                        +---------+---------------+---------+-----------+----------+--------------+ FV Prox  Full                                                        +---------+---------------+---------+-----------+----------+--------------+ FV Mid   Full                                                        +---------+---------------+---------+-----------+----------+--------------+ FV DistalFull                                                         +---------+---------------+---------+-----------+----------+--------------+ PFV      Full                                                        +---------+---------------+---------+-----------+----------+--------------+ POP  Full           Yes      Yes                                 +---------+---------------+---------+-----------+----------+--------------+ PTV      Full                                                        +---------+---------------+---------+-----------+----------+--------------+ PERO     Full                                                        +---------+---------------+---------+-----------+----------+--------------+   +----+---------------+---------+-----------+----------+--------------+ LEFTCompressibilityPhasicitySpontaneityPropertiesThrombus Aging +----+---------------+---------+-----------+----------+--------------+ CFV Full           Yes      Yes                                 +----+---------------+---------+-----------+----------+--------------+ SFJ Full                                                        +----+---------------+---------+-----------+----------+--------------+     Summary: RIGHT: - There is no evidence of deep vein thrombosis in the lower extremity.  - No cystic structure found in the popliteal fossa.  LEFT: - No evidence of common femoral vein obstruction.  *See table(s) above for measurements and observations. Electronically signed by Waverly Ferrari MD on 08/17/2022 at 5:06:09 PM.    Final    DG Chest 2 View  Result Date: 08/17/2022 CLINICAL DATA:  Shortness of breath. EXAM: CHEST - 2 VIEW COMPARISON:  05/23/2022 FINDINGS: Mild cardiac enlargement. There is no evidence of pulmonary edema, consolidation, pneumothorax, nodule or pleural fluid. Visualized bony structures are unremarkable. IMPRESSION: Mild cardiac enlargement. Electronically Signed   By: Irish Lack M.D.   On: 08/17/2022 10:00   VAS [place] LOWER EXTREMITY VENOUS (DVT) (7a-7p)  Result Date: 07/30/2022  Lower Venous DVT Study Patient Name:  Brendan Butler  Date of Exam:   07/30/2022 Medical Rec #: [id number]         Accession #:    [id number] Date of Birth: [birth year]        Patient Gender: M Patient Age:   37 years Exam Location:  [facility] Procedure:      VAS [place] LOWER EXTREMITY VENOUS (DVT) Referring Phys: Velda Shell BLUE --------------------------------------------------------------------------------  Indications: RLE swelling after medication changes.  Comparison Study: Previous 01/27/16 negative. Performing Technologist: McKayla Maag RVT, VT  Examination Guidelines: A complete evaluation includes B-mode imaging, spectral Doppler, color Doppler, and power Doppler as needed of all accessible portions of each vessel. Bilateral testing is considered an integral part of a complete examination. Limited examinations for reoccurring indications may be performed as noted. The reflux portion of the exam is performed with the patient in reverse Trendelenburg.  +---------+---------------+---------+-----------+----------+--------------+ RIGHT    CompressibilityPhasicitySpontaneityPropertiesThrombus Aging +---------+---------------+---------+-----------+----------+--------------+  CFV      Full           Yes      Yes                                 +---------+---------------+---------+-----------+----------+--------------+ SFJ      Full                                                        +---------+---------------+---------+-----------+----------+--------------+ FV Prox  Full                                                        +---------+---------------+---------+-----------+----------+--------------+ FV Mid   Full                                                        +---------+---------------+---------+-----------+----------+--------------+ FV DistalFull                                                         +---------+---------------+---------+-----------+----------+--------------+ PFV      Full                                                        +---------+---------------+---------+-----------+----------+--------------+ POP      Full           Yes      Yes                                 +---------+---------------+---------+-----------+----------+--------------+ PTV      Full                                                        +---------+---------------+---------+-----------+----------+--------------+ PERO     Full                                                        +---------+---------------+---------+-----------+----------+--------------+   +----+---------------+---------+-----------+----------+--------------+ LEFTCompressibilityPhasicitySpontaneityPropertiesThrombus Aging +----+---------------+---------+-----------+----------+--------------+ CFV Full           Yes      Yes                                 +----+---------------+---------+-----------+----------+--------------+  SFJ Full                                                        +----+---------------+---------+-----------+----------+--------------+     Summary: RIGHT: - There is no evidence of deep vein thrombosis in the lower extremity.  - No cystic structure found in the popliteal fossa.  LEFT: - No evidence of common femoral vein obstruction.  *See table(s) above for measurements and observations. Electronically signed by Lemar Livings MD on 07/30/2022 at 4:39:10 PM.    Final     Microbiology: Results for orders placed or performed during the hospital encounter of 08/17/22  Surgical pcr screen     Status: None   Collection Time: 08/22/22  9:15 PM   Specimen: Nasal Mucosa; Nasal Swab  Result Value Ref Range Status   MRSA, PCR NEGATIVE NEGATIVE Final   Staphylococcus aureus NEGATIVE NEGATIVE Final    Comment: (NOTE) The Xpert SA Assay (FDA  approved for NASAL specimens in patients 54 years of age and older), is one component of a comprehensive surveillance program. It is not intended to diagnose infection nor to guide or monitor treatment. Performed at [facility], [address], [place], [place] [address]     Labs: CBC: Recent Labs  Lab 08/22/22 0048 08/23/22 0053 08/24/22 0024 08/26/22 0040 08/27/22 0033  WBC 11.8* 14.5* 14.3* 11.8* 11.0*  NEUTROABS  --   --   --  6.6  --   HGB 9.0* 9.8* 9.7* 8.8* 8.5*  HCT 29.2* 32.8* 31.8* 28.6* 27.4*  MCV 82.0 82.2 83.0 83.9 81.5  PLT 253 271 202 187 195   Basic Metabolic Panel: Recent Labs  Lab 08/24/22 0025 08/26/22 0040 08/27/22 0033 08/27/22 1315 08/28/22 0041  NA 140 138 138 139 139  K 4.5 3.2* 2.9* 3.1* 3.9  CL 105 99 99 101 104  CO2 26 29 29 27 28   GLUCOSE 115* 94 108* 126* 101*  BUN 17 20 23  24* 25*  CREATININE 1.77* 1.85* 1.92* 1.95* 1.76*  CALCIUM 8.4* 8.6* 8.5* 8.6* 8.3*  MG 2.0 2.1 2.2  --  2.4  PHOS 3.9 3.5 3.2  --  2.8   Liver Function Tests: Recent Labs  Lab 08/24/22 0025 08/26/22 0040 08/27/22 0033 08/28/22 0041  ALBUMIN 3.1* 3.0* 2.7* 2.7*   CBG: No results for input(s): "GLUCAP" in the last 168 hours.  Discharge time spent: greater than 30 minutes.  Signed: Gian Ybarra, DO [facility] 08/28/2022

## 2022-08-28 NOTE — Progress Notes (Signed)
Heart Failure Stewardship Pharmacist Progress Note   PCP: [name], MD PCP-Cardiologist: Charlton Haws, MD    HPI:  72 yo M with PMH of chronic HFrEF with EF as low as 35-40% in 12/2012 with normalization to 55-60% in 11/2021 but then down again to 20-25% on TEE in 04/2022, CAD, GIB, bradycardia, aflutter, HTN, HLD, OSA, CKD IIIa, and prior left nephrectomy.  Admitted in 12/2012 with STEMI s/p PCI to mild LAD. Readmitted 1 week later with in-stent restenosis complicated by VF on cath table, thought to be due to Brilinta failure vs noncompliance. LVEF was 35-40%.   Admitted in 04/2022 with altered mental status. Noncompliance to medications. Found to have atrial flutter and decline of LVEF. Underwent successful TEE/DCCV on 3/14. He left AMA.   Readmitted later that month with aflutter RVR, AMS, fall, and rib fractures. He was started on IV amiodarone and metoprolol. Amiodarone was later stopped due to concern for compliance. He was noted to have some pauses on telemetry so a live Zio monitor was placed and plan was for discharge to SNF on 05/24/2022; however, while waiting for transportation, he was noted to have a 4 second pause followed by a 5.8 second pause. Discharge was cancelled and BB was stopped. EP was consulted and he was restarted on IV amiodarone and then underwent repeat TEE/DCCV on 05/28/2022 with restoration of sinus rhythm. EP recommended avoiding permament pacemaker if at all possible given compliance issues.   Admitted 07/2022 with acute blood loss anemia requiring PRBC. GI recommended colonoscopy but patient declined, treated with IV iron. He did not follow up as outpatient.   Presented to the ED on 6/21 with shortness of breath, fatigue, weight gain, and LE edema despite taking increased lasix at home after speaking with outpatient cardiology on 6/18. CXR with mild cardiac enlargement. ECHO 6/22 showed LVEF 50%, regional wall motion abnormalities, RV normal.   Underwent  surgery for R colon mass on 6/27. Resumed Xarelto on 7/1.  Current HF Medications: Diuretic: furosemide 40 mg PO BID ACE/ARB/ARNI: Entresto 24/26 mg BID MRA: spironolactone 12.5 mg daily SGLT2i: Jardiance 10 mg daily  Prior to admission HF Medications: Diuretic: furosemide 40 mg daily ACE/ARB/ARNI: Entresto 24/26 mg BID MRA: spironolactone 12.5 mg daily SGLT2i: Jardiance 10 mg daily  Pertinent Lab Values: Serum creatinine 1.76, BUN 25, Potassium 3.9, Sodium 139, BNP 26.9>41.5, Magnesium 2.4 TSAT 3, Ferritin 17  Vital Signs: Weight: 202 lbs (admission weight: 216 lbs) Blood pressure: 140/50s  Heart rate: 60s  I/O: net -5.4L  Medication Assistance / Insurance Benefits Check: Does the patient have prescription insurance?  Yes Type of insurance plan: [employer]  Outpatient Pharmacy:  Prior to admission outpatient pharmacy: [facility], mail order Is the patient willing to use [facility] TOC pharmacy at discharge? Yes Is the patient willing to transition their outpatient pharmacy to utilize a [facility] outpatient pharmacy?   No    Assessment: 1. Acute on chronic HFimpEF (LVEF 50%), due to ICM. NYHA class II symptoms. - Continue furosemide 40 mg PO BID. Strict I/Os and daily weights. Keep K>4 and Mg>2. KCl 40 mEq x 2 ordered for replacement.  - No BB with bradycardia - Continue Entresto 24/26 mg BID - Continue spironolactone 12.5 mg daily  - Continue Jardiance 10 mg daily - Iron stores low. S/p Ferrlecit 250 mg IV x 4 doses.   Plan: 1) Medication changes recommended at this time: - Continue current regimen  2) Patient assistance: - None pending - patient states all  copays are affordable for him. His daughter helps with medications at home and sets them up in pill packs for him.  3)  Education  - Patient has been educated on current HF medications and potential additions to HF medication regimen - Patient verbalizes understanding that over the next few months, these medication  doses may change and more medications may be added to optimize HF regimen - Patient has been educated on basic disease state pathophysiology and goals of therapy   Sharen Hones, PharmD, BCPS Heart Failure Stewardship Pharmacist Phone [phone]

## 2022-08-28 NOTE — Plan of Care (Signed)

## 2022-08-28 NOTE — Telephone Encounter (Signed)
Pharmacy Patient Advocate Encounter   Received notification that prior authorization for oxyCODONE HCl 5MG  tablets is required/requested.  PA submitted to [facility] via CoverMyMeds Key/confirmation #/EOC [id number] Status is pending

## 2022-08-28 NOTE — Progress Notes (Signed)
Discharge instructions provided. Patient verbalizes understanding of all instructions and follow-up care. Patient discharged to home with all belongings and TOC meds via wheelchair by NT.

## 2022-08-28 NOTE — Telephone Encounter (Signed)
Pharmacy Patient Advocate Encounter  Received notification from [facility] that Prior Authorization for ALPRAZolam 0.25MG  tablets  has been APPROVED from 08/28/2022 to 02/26/2023.Marland Kitchen  PA #/Case ID/Reference #: [id number]

## 2022-08-28 NOTE — Plan of Care (Signed)
  Problem: Education: Goal: Knowledge of General Education information will improve Description: Including pain rating scale, medication(s)/side effects and non-pharmacologic comfort measures 08/28/2022 1634 by Reynold Bowen, RN Outcome: Adequate for Discharge 08/28/2022 1133 by Reynold Bowen, RN Outcome: Progressing   Problem: Health Behavior/Discharge Planning: Goal: Ability to manage health-related needs will improve 08/28/2022 1634 by Reynold Bowen, RN Outcome: Adequate for Discharge 08/28/2022 1133 by Reynold Bowen, RN Outcome: Progressing   Problem: Clinical Measurements: Goal: Ability to maintain clinical measurements within normal limits will improve 08/28/2022 1634 by Reynold Bowen, RN Outcome: Adequate for Discharge 08/28/2022 1133 by Reynold Bowen, RN Outcome: Progressing Goal: Will remain free from infection 08/28/2022 1634 by Reynold Bowen, RN Outcome: Adequate for Discharge 08/28/2022 1133 by Reynold Bowen, RN Outcome: Progressing Goal: Diagnostic test results will improve Outcome: Adequate for Discharge Goal: Respiratory complications will improve Outcome: Adequate for Discharge Goal: Cardiovascular complication will be avoided Outcome: Adequate for Discharge   Problem: Activity: Goal: Risk for activity intolerance will decrease Outcome: Adequate for Discharge   Problem: Nutrition: Goal: Adequate nutrition will be maintained Outcome: Adequate for Discharge   Problem: Coping: Goal: Level of anxiety will decrease Outcome: Adequate for Discharge   Problem: Elimination: Goal: Will not experience complications related to bowel motility Outcome: Adequate for Discharge Goal: Will not experience complications related to urinary retention Outcome: Adequate for Discharge   Problem: Pain Managment: Goal: General experience of comfort will improve Outcome: Adequate for Discharge   Problem: Safety: Goal:  Ability to remain free from injury will improve Outcome: Adequate for Discharge   Problem: Skin Integrity: Goal: Risk for impaired skin integrity will decrease Outcome: Adequate for Discharge   Problem: Education: Goal: Understanding of discharge needs will improve Outcome: Adequate for Discharge Goal: Verbalization of understanding of the causes of altered bowel function will improve Outcome: Adequate for Discharge   Problem: Activity: Goal: Ability to tolerate increased activity will improve Outcome: Adequate for Discharge   Problem: Bowel/Gastric: Goal: Gastrointestinal status for postoperative course will improve Outcome: Adequate for Discharge   Problem: Health Behavior/Discharge Planning: Goal: Identification of community resources to assist with postoperative recovery needs will improve Outcome: Adequate for Discharge   Problem: Nutritional: Goal: Will attain and maintain optimal nutritional status will improve Outcome: Adequate for Discharge   Problem: Clinical Measurements: Goal: Postoperative complications will be avoided or minimized Outcome: Adequate for Discharge   Problem: Respiratory: Goal: Respiratory status will improve Outcome: Adequate for Discharge   Problem: Skin Integrity: Goal: Will show signs of wound healing Outcome: Adequate for Discharge

## 2022-08-28 NOTE — Progress Notes (Signed)
Nutrition Follow-up  DOCUMENTATION CODES:   Not applicable  INTERVENTION:  Discontinue Boost Breeze and ProSource  Boost Plus po BID, each supplement provides 360 kcal and 14 grams of protein MVI with minerals daily  NUTRITION DIAGNOSIS:   Increased nutrient needs related to acute illness, cancer and cancer related treatments as evidenced by estimated needs. - remains applicable   GOAL:   Patient will meet greater than or equal to 90% of their needs - progressing  MONITOR:   PO intake, Supplement acceptance, Diet advancement, Labs, Weight trends  REASON FOR ASSESSMENT:   NPO/Clear Liquid Diet    ASSESSMENT:   Pt admitted with SOB, edema, progressive weight gain and fatigue. Work up with findings of symptomatic anemia. PMH significant for CAD, multiple PCI and stenting, aflutter, depression, HTN, CKD 3, bronchiectasis, L nephrectomy.  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 Hopeful for discharge today. He endorses having a good appetite, ate 100% of breakfast. He does not like Boost Breeze so has not been consuming but was tolerating prosource. Adjusted order to Boost Plus and RN provided one during visit. He likes these supplements and is agreeable to continuing them at home to optimize intake.   Meal completions: 7/1: 50% breakfast, 10% lunch, 25% dinner 7/2: 100% breakfast, 25% lunch  Admit weight: 98.2 kg Current weight: 92 kg  Medications: jardiance, lasix, MVI, protonix  Labs: BUN 25, Cr 1.76, GFR 41  Diet Order:   Diet Order             Diet - low sodium heart healthy           DIET SOFT Room service appropriate? Yes; Fluid consistency: Thin  Diet effective now                   EDUCATION NEEDS:   Education needs have  been addressed  Skin:  Skin Assessment: Reviewed RN Assessment (closed abdominal incision)  Last BM:  6/30  Height:   Ht Readings from Last 1 Encounters:  08/23/22 5\' 10"  (1.778 m)    Weight:   Wt Readings from Last 1 Encounters:  08/28/22 92 kg   BMI:  Body mass index is 29.1 kg/m.  Estimated Nutritional Needs:   Kcal:  2200-2400  Protein:  120-135g  Fluid:  >/=2L  Drusilla Kanner, RDN, LDN Clinical Nutrition

## 2022-08-28 NOTE — Progress Notes (Signed)
Mobility Specialist Progress Note:    08/28/22 1102  Mobility  Activity Ambulated with assistance in hallway  Level of Assistance Standby assist, set-up cues, supervision of patient - no hands on  Assistive Device None  Distance Ambulated (ft) 500 ft  Activity Response Tolerated well  Mobility Referral Yes  $Mobility charge 1 Mobility  Mobility Specialist Start Time (ACUTE ONLY) [id number]  Mobility Specialist Stop Time (ACUTE ONLY) 0956  Mobility Specialist Time Calculation (min) (ACUTE ONLY) 8 min   Pt received in bed agreeable to ambulate. Pt needed no physical assistance throughout session. Pt c/o pain in his stomach rating it an 8/10, otherwise no c/o. Assisted back to bed w/ call bell and personal belongings at hand. All needs met.  Thorner Grayer Mobility Specialist  Please contact vis Secure Chat or  Rehab Office [phone]

## 2022-09-03 ENCOUNTER — Ambulatory Visit (INDEPENDENT_AMBULATORY_CARE_PROVIDER_SITE_OTHER): Payer: Medicare HMO | Admitting: Student

## 2022-09-03 ENCOUNTER — Other Ambulatory Visit (HOSPITAL_BASED_OUTPATIENT_CLINIC_OR_DEPARTMENT_OTHER): Payer: Self-pay

## 2022-09-03 ENCOUNTER — Encounter: Payer: Self-pay | Admitting: Student

## 2022-09-03 VITALS — BP 138/52 | HR 68 | Ht 71.0 in | Wt 212.6 lb

## 2022-09-03 DIAGNOSIS — I2510 Atherosclerotic heart disease of native coronary artery without angina pectoris: Secondary | ICD-10-CM | POA: Diagnosis not present

## 2022-09-03 DIAGNOSIS — I5022 Chronic systolic (congestive) heart failure: Secondary | ICD-10-CM

## 2022-09-03 DIAGNOSIS — Z01818 Encounter for other preprocedural examination: Secondary | ICD-10-CM | POA: Diagnosis not present

## 2022-09-03 DIAGNOSIS — I4892 Unspecified atrial flutter: Secondary | ICD-10-CM

## 2022-09-03 DIAGNOSIS — R600 Localized edema: Secondary | ICD-10-CM | POA: Diagnosis not present

## 2022-09-03 DIAGNOSIS — N183 Chronic kidney disease, stage 3 unspecified: Secondary | ICD-10-CM

## 2022-09-03 DIAGNOSIS — E876 Hypokalemia: Secondary | ICD-10-CM

## 2022-09-03 DIAGNOSIS — I10 Essential (primary) hypertension: Secondary | ICD-10-CM | POA: Diagnosis not present

## 2022-09-03 DIAGNOSIS — E785 Hyperlipidemia, unspecified: Secondary | ICD-10-CM

## 2022-09-03 LAB — CBC

## 2022-09-03 LAB — BASIC METABOLIC PANEL
CO2: 26 mmol/L (ref 20–29)
Calcium: 8.6 mg/dL (ref 8.6–10.2)
Sodium: 139 mmol/L (ref 134–144)

## 2022-09-03 NOTE — Patient Instructions (Addendum)
Medication Instructions:  Your physician recommends that you continue on your current medications as directed. Please refer to the Current Medication list given to you today.  *If you need a refill on your cardiac medications before your next appointment, please call your pharmacy*   Lab Work: Labs will be drawn today If you have labs (blood work) drawn today and your tests are completely normal, you will receive your results only by: MyChart Message (if you have MyChart) OR A paper copy in the mail If you have any lab test that is abnormal or we need to change your treatment, we will call you to review the results.   Testing/Procedures: None ordered   Follow-Up: At [facility], you and your health needs are our priority.  As part of our continuing mission to provide you with exceptional heart care, we have created designated Provider Care Teams.  These Care Teams include your primary Cardiologist (physician) and Advanced Practice Providers (APPs -  Physician Assistants and Nurse Practitioners) who all work together to provide you with the care you need, when you need it.  We recommend signing up for the patient portal called "MyChart".  Sign up information is provided on this After Visit Summary.  MyChart is used to connect with patients for Virtual Visits (Telemedicine).  Patients are able to view lab/test results, encounter notes, upcoming appointments, etc.  Non-urgent messages can be sent to your provider as well.   To learn more about what you can do with MyChart, go to [email or web address].    Your next appointment:   3 month(s)  Provider:   Marjie Skiff, PA-C        Also, keep your already scheduled appointment with the Heart Failure Clinic 09/10/2022  Heart Failure Education: Weigh yourself EVERY morning after you go to the bathroom but before you eat or drink anything. Write this number down in a weight log/diary. If you gain 3 pounds overnight or 5 pounds in  a week, call the office. Take your medicines as prescribed. If you have concerns about your medications, please call us before you stop taking them.  Eat low salt foods--Limit salt (sodium) to 2000 mg per day. This will help prevent your body from holding onto fluid. Read food labels as many processed foods have a lot of sodium, especially canned goods and prepackaged meats. If you would like some assistance choosing low sodium foods, we would be happy to set you up with a nutritionist. Limit all fluids for the day to less than 2 liters (64 ounces). Fluid includes all drinks, coffee, juice, ice chips, soup, jello, and all other liquids. Stay as active as you can everyday. Staying active will give you more energy and make your muscles stronger. Start with 5 minutes at a time and work your way up to 30 minutes a day. Break up your activities--do some in the morning and some in the afternoon. Start with 3 days per week and work your way up to 5 days as you can.  If you have chest pain, feel short of breath, dizzy, or lightheaded, STOP. If you don't feel better after a short rest, call 911. If you do feel better, call the office to let us know you have symptoms with exercise.

## 2022-09-04 LAB — CBC
Hematocrit: 29.6 % — ABNORMAL LOW (ref 37.5–51.0)
Hemoglobin: 9 g/dL — ABNORMAL LOW (ref 13.0–17.7)
MCHC: 30.4 g/dL — ABNORMAL LOW (ref 31.5–35.7)
MCV: 84 fL (ref 79–97)
Platelets: 331 10*3/uL (ref 150–450)
WBC: 12.3 10*3/uL — ABNORMAL HIGH (ref 3.4–10.8)

## 2022-09-04 LAB — BASIC METABOLIC PANEL
BUN/Creatinine Ratio: 7 — ABNORMAL LOW (ref 10–24)
BUN: 15 mg/dL (ref 8–27)
Chloride: 101 mmol/L (ref 96–106)
Creatinine, Ser: 2.04 mg/dL — ABNORMAL HIGH (ref 0.76–1.27)
Glucose: 80 mg/dL (ref 70–99)
Potassium: 5.2 mmol/L (ref 3.5–5.2)
eGFR: 35 mL/min/{1.73_m2} — ABNORMAL LOW (ref >59)

## 2022-09-04 LAB — BRAIN NATRIURETIC PEPTIDE: BNP: 22.6 pg/mL (ref 0.0–100.0)

## 2022-09-06 DIAGNOSIS — Z9049 Acquired absence of other specified parts of digestive tract: Secondary | ICD-10-CM | POA: Diagnosis not present

## 2022-09-06 DIAGNOSIS — N1832 Chronic kidney disease, stage 3b: Secondary | ICD-10-CM | POA: Diagnosis not present

## 2022-09-06 DIAGNOSIS — I5033 Acute on chronic diastolic (congestive) heart failure: Secondary | ICD-10-CM | POA: Diagnosis not present

## 2022-09-07 ENCOUNTER — Telehealth (HOSPITAL_COMMUNITY): Payer: Self-pay

## 2022-09-07 NOTE — Telephone Encounter (Signed)
Called to confirm Heart & Vascular Transitions of Care appointment. No answer, Left message to return call to Confirm appointment

## 2022-09-08 ENCOUNTER — Other Ambulatory Visit (HOSPITAL_BASED_OUTPATIENT_CLINIC_OR_DEPARTMENT_OTHER): Payer: Self-pay

## 2022-09-10 ENCOUNTER — Encounter (HOSPITAL_COMMUNITY): Payer: Self-pay

## 2022-09-10 ENCOUNTER — Inpatient Hospital Stay (HOSPITAL_COMMUNITY)
Admission: EM | Admit: 2022-09-10 | Discharge: 2022-09-13 | DRG: 392 | Disposition: A | Discharge status: Home / Self Care | Payer: Medicare HMO | Attending: Internal Medicine | Admitting: Internal Medicine

## 2022-09-10 ENCOUNTER — Encounter (HOSPITAL_COMMUNITY): Payer: Medicare HMO

## 2022-09-10 ENCOUNTER — Emergency Department (HOSPITAL_COMMUNITY): Payer: Medicare HMO

## 2022-09-10 ENCOUNTER — Other Ambulatory Visit: Payer: Self-pay

## 2022-09-10 DIAGNOSIS — R112 Nausea with vomiting, unspecified: Secondary | ICD-10-CM | POA: Diagnosis present

## 2022-09-10 DIAGNOSIS — E872 Acidosis, unspecified: Secondary | ICD-10-CM | POA: Diagnosis present

## 2022-09-10 DIAGNOSIS — Z8673 Personal history of transient ischemic attack (TIA), and cerebral infarction without residual deficits: Secondary | ICD-10-CM

## 2022-09-10 DIAGNOSIS — R1110 Vomiting, unspecified: Secondary | ICD-10-CM

## 2022-09-10 DIAGNOSIS — I130 Hypertensive heart and chronic kidney disease with heart failure and stage 1 through stage 4 chronic kidney disease, or unspecified chronic kidney disease: Secondary | ICD-10-CM | POA: Diagnosis not present

## 2022-09-10 DIAGNOSIS — D631 Anemia in chronic kidney disease: Secondary | ICD-10-CM | POA: Diagnosis not present

## 2022-09-10 DIAGNOSIS — F418 Other specified anxiety disorders: Secondary | ICD-10-CM | POA: Diagnosis not present

## 2022-09-10 DIAGNOSIS — I482 Chronic atrial fibrillation, unspecified: Secondary | ICD-10-CM | POA: Diagnosis present

## 2022-09-10 DIAGNOSIS — I480 Paroxysmal atrial fibrillation: Secondary | ICD-10-CM | POA: Diagnosis present

## 2022-09-10 DIAGNOSIS — Z955 Presence of coronary angioplasty implant and graft: Secondary | ICD-10-CM

## 2022-09-10 DIAGNOSIS — I252 Old myocardial infarction: Secondary | ICD-10-CM

## 2022-09-10 DIAGNOSIS — Z9049 Acquired absence of other specified parts of digestive tract: Secondary | ICD-10-CM

## 2022-09-10 DIAGNOSIS — A419 Sepsis, unspecified organism: Secondary | ICD-10-CM | POA: Diagnosis not present

## 2022-09-10 DIAGNOSIS — Z7984 Long term (current) use of oral hypoglycemic drugs: Secondary | ICD-10-CM

## 2022-09-10 DIAGNOSIS — E785 Hyperlipidemia, unspecified: Secondary | ICD-10-CM | POA: Diagnosis present

## 2022-09-10 DIAGNOSIS — N1832 Chronic kidney disease, stage 3b: Secondary | ICD-10-CM | POA: Diagnosis not present

## 2022-09-10 DIAGNOSIS — Z1152 Encounter for screening for COVID-19: Secondary | ICD-10-CM

## 2022-09-10 DIAGNOSIS — G4733 Obstructive sleep apnea (adult) (pediatric): Secondary | ICD-10-CM

## 2022-09-10 DIAGNOSIS — R109 Unspecified abdominal pain: Secondary | ICD-10-CM | POA: Diagnosis not present

## 2022-09-10 DIAGNOSIS — I484 Atypical atrial flutter: Secondary | ICD-10-CM | POA: Diagnosis not present

## 2022-09-10 DIAGNOSIS — Z905 Acquired absence of kidney: Secondary | ICD-10-CM

## 2022-09-10 DIAGNOSIS — Z8249 Family history of ischemic heart disease and other diseases of the circulatory system: Secondary | ICD-10-CM

## 2022-09-10 DIAGNOSIS — D72829 Elevated white blood cell count, unspecified: Secondary | ICD-10-CM | POA: Diagnosis present

## 2022-09-10 DIAGNOSIS — Z820 Family history of epilepsy and other diseases of the nervous system: Secondary | ICD-10-CM

## 2022-09-10 DIAGNOSIS — F32A Depression, unspecified: Secondary | ICD-10-CM | POA: Diagnosis present

## 2022-09-10 DIAGNOSIS — I4892 Unspecified atrial flutter: Secondary | ICD-10-CM | POA: Diagnosis present

## 2022-09-10 DIAGNOSIS — Z7901 Long term (current) use of anticoagulants: Secondary | ICD-10-CM

## 2022-09-10 DIAGNOSIS — Z79899 Other long term (current) drug therapy: Secondary | ICD-10-CM

## 2022-09-10 DIAGNOSIS — E861 Hypovolemia: Secondary | ICD-10-CM | POA: Diagnosis present

## 2022-09-10 DIAGNOSIS — Z888 Allergy status to other drugs, medicaments and biological substances status: Secondary | ICD-10-CM | POA: Diagnosis not present

## 2022-09-10 DIAGNOSIS — I10 Essential (primary) hypertension: Secondary | ICD-10-CM | POA: Diagnosis present

## 2022-09-10 DIAGNOSIS — I5042 Chronic combined systolic (congestive) and diastolic (congestive) heart failure: Secondary | ICD-10-CM | POA: Diagnosis not present

## 2022-09-10 DIAGNOSIS — I5032 Chronic diastolic (congestive) heart failure: Secondary | ICD-10-CM | POA: Diagnosis not present

## 2022-09-10 DIAGNOSIS — Z886 Allergy status to analgesic agent status: Secondary | ICD-10-CM

## 2022-09-10 DIAGNOSIS — Z720 Tobacco use: Secondary | ICD-10-CM

## 2022-09-10 DIAGNOSIS — R197 Diarrhea, unspecified: Secondary | ICD-10-CM | POA: Diagnosis not present

## 2022-09-10 DIAGNOSIS — I2510 Atherosclerotic heart disease of native coronary artery without angina pectoris: Secondary | ICD-10-CM | POA: Diagnosis present

## 2022-09-10 DIAGNOSIS — K529 Noninfective gastroenteritis and colitis, unspecified: Principal | ICD-10-CM | POA: Diagnosis present

## 2022-09-10 DIAGNOSIS — Z6829 Body mass index (BMI) 29.0-29.9, adult: Secondary | ICD-10-CM | POA: Diagnosis not present

## 2022-09-10 DIAGNOSIS — C189 Malignant neoplasm of colon, unspecified: Secondary | ICD-10-CM | POA: Diagnosis present

## 2022-09-10 DIAGNOSIS — F419 Anxiety disorder, unspecified: Secondary | ICD-10-CM | POA: Diagnosis present

## 2022-09-10 DIAGNOSIS — K5730 Diverticulosis of large intestine without perforation or abscess without bleeding: Secondary | ICD-10-CM | POA: Diagnosis not present

## 2022-09-10 DIAGNOSIS — R059 Cough, unspecified: Secondary | ICD-10-CM | POA: Diagnosis not present

## 2022-09-10 DIAGNOSIS — E876 Hypokalemia: Secondary | ICD-10-CM | POA: Diagnosis present

## 2022-09-10 DIAGNOSIS — R627 Adult failure to thrive: Secondary | ICD-10-CM | POA: Diagnosis present

## 2022-09-10 DIAGNOSIS — K5289 Other specified noninfective gastroenteritis and colitis: Secondary | ICD-10-CM | POA: Diagnosis not present

## 2022-09-10 LAB — CBC WITH DIFFERENTIAL/PLATELET
Abs Immature Granulocytes: 0.11 10*3/uL — ABNORMAL HIGH (ref 0.00–0.07)
Basophils Absolute: 0.1 10*3/uL (ref 0.0–0.1)
Basophils Relative: 0 %
Eosinophils Absolute: 0 10*3/uL (ref 0.0–0.5)
Eosinophils Relative: 0 %
HCT: 35.4 % — ABNORMAL LOW (ref 39.0–52.0)
Hemoglobin: 10.7 g/dL — ABNORMAL LOW (ref 13.0–17.0)
Immature Granulocytes: 1 %
Lymphocytes Relative: 17 %
Lymphs Abs: 3.6 10*3/uL (ref 0.7–4.0)
MCH: 25 pg — ABNORMAL LOW (ref 26.0–34.0)
MCHC: 30.2 g/dL (ref 30.0–36.0)
MCV: 82.7 fL (ref 80.0–100.0)
Monocytes Absolute: 1.6 10*3/uL — ABNORMAL HIGH (ref 0.1–1.0)
Monocytes Relative: 8 %
Neutro Abs: 16.2 10*3/uL — ABNORMAL HIGH (ref 1.7–7.7)
Neutrophils Relative %: 74 %
Platelets: 535 10*3/uL — ABNORMAL HIGH (ref 150–400)
RBC: 4.28 MIL/uL (ref 4.22–5.81)
RDW: 19.9 % — ABNORMAL HIGH (ref 11.5–15.5)
WBC: 21.6 10*3/uL — ABNORMAL HIGH (ref 4.0–10.5)
nRBC: 0 % (ref 0.0–0.2)

## 2022-09-10 LAB — COMPREHENSIVE METABOLIC PANEL
ALT: 15 U/L (ref 0–44)
AST: 25 U/L (ref 15–41)
Albumin: 3.7 g/dL (ref 3.5–5.0)
Alkaline Phosphatase: 81 U/L (ref 38–126)
Anion gap: 14 (ref 5–15)
BUN: 18 mg/dL (ref 8–23)
CO2: 20 mmol/L — ABNORMAL LOW (ref 22–32)
Calcium: 9.4 mg/dL (ref 8.9–10.3)
Chloride: 108 mmol/L (ref 98–111)
Creatinine, Ser: 1.76 mg/dL — ABNORMAL HIGH (ref 0.61–1.24)
GFR, Estimated: 41 mL/min — ABNORMAL LOW (ref >60)
Glucose, Bld: 120 mg/dL — ABNORMAL HIGH (ref 70–99)
Potassium: 3.5 mmol/L (ref 3.5–5.1)
Sodium: 142 mmol/L (ref 135–145)
Total Bilirubin: 0.8 mg/dL (ref 0.3–1.2)
Total Protein: 7.4 g/dL (ref 6.5–8.1)

## 2022-09-10 LAB — LIPASE, BLOOD: Lipase: 34 U/L (ref 11–51)

## 2022-09-10 LAB — RESP PANEL BY RT-PCR (RSV, FLU A&B, COVID)  RVPGX2
Influenza A by PCR: NEGATIVE
Influenza B by PCR: NEGATIVE
Resp Syncytial Virus by PCR: NEGATIVE
SARS Coronavirus 2 by RT PCR: NEGATIVE

## 2022-09-10 LAB — LACTIC ACID, PLASMA
Lactic Acid, Venous: 2.9 mmol/L (ref 0.5–1.9)
Lactic Acid, Venous: 1.3 mmol/L (ref 0.5–1.9)

## 2022-09-10 MED ORDER — SODIUM CHLORIDE 0.9 % IV BOLUS
1000.0000 mL | Freq: Once | INTRAVENOUS | Status: AC
  Administered 2022-09-10: 1000 mL via INTRAVENOUS

## 2022-09-10 MED ORDER — ONDANSETRON HCL 4 MG/2ML IJ SOLN
4.0000 mg | Freq: Once | INTRAMUSCULAR | Status: AC
  Administered 2022-09-10: 4 mg via INTRAVENOUS
  Filled 2022-09-10: qty 2

## 2022-09-10 MED ORDER — SODIUM CHLORIDE 0.9 % IV SOLN
1.0000 g | Freq: Once | INTRAVENOUS | Status: AC
  Administered 2022-09-10: 1 g via INTRAVENOUS
  Filled 2022-09-10: qty 10

## 2022-09-10 MED ORDER — IOHEXOL 350 MG/ML SOLN
60.0000 mL | Freq: Once | INTRAVENOUS | Status: AC | PRN
  Administered 2022-09-10: 60 mL via INTRAVENOUS

## 2022-09-10 MED ORDER — METRONIDAZOLE 500 MG/100ML IV SOLN
500.0000 mg | Freq: Once | INTRAVENOUS | Status: AC
  Administered 2022-09-10: 500 mg via INTRAVENOUS
  Filled 2022-09-10: qty 100

## 2022-09-10 MED ORDER — VANCOMYCIN HCL 1750 MG/350ML IV SOLN
1750.0000 mg | Freq: Once | INTRAVENOUS | Status: AC
  Administered 2022-09-11: 1750 mg via INTRAVENOUS
  Filled 2022-09-10: qty 350

## 2022-09-10 NOTE — ED Triage Notes (Signed)
Patient arrives with family from home-had recent surgery and since the weekend, patient having extreme fatigue, n/v, and non-productive cough. Patient isn't taking his medication at home d/t the nausea. Patient also adds that there was blood in the toilet when he had a bowel movement this morning.

## 2022-09-10 NOTE — Progress Notes (Signed)
ED Pharmacy Antibiotic Sign Off An antibiotic consult was received from an ED provider for Vancomycin per pharmacy dosing for sepsis. A chart review was completed to assess appropriateness.   The following one time order(s) were placed:  Vancomycin 1750 mg IV x 1  Further antibiotic and/or antibiotic pharmacy consults should be ordered by the admitting provider if indicated.   Thank you for allowing pharmacy to be a part of this patient's care.   Abran Duke, PharmD, BCPS Clinical Pharmacist Phone: [phone]

## 2022-09-10 NOTE — Consult Note (Signed)
Reason for Consult/Chief Complaint:*** Consultant: ***, {Blank single:19197::"DO","PA","MD"}  Brendan Butler is an 72 y.o. male.   HPI: ***  Past Medical History:  Diagnosis Date   Acute blood loss anemia 07/30/2022   Acute cholecystitis 12/25/2021   Altered mental status 05/06/2022   Atrial flutter with rapid ventricular response (HCC) 05/06/2022   Basal ganglia infarction (HCC)    Bradycardia    CAD (coronary artery disease)    Chronic HFrEF (heart failure with reduced ejection fraction) (HCC)    Chronic kidney disease, stage 3 (HCC)    Erectile dysfunction    Hyperlipidemia LDL goal <70    Hypertension    OSA (obstructive sleep apnea)    No CPAP use overnight   PAF (paroxysmal atrial fibrillation) (HCC)    Paroxysmal atrial flutter (HCC)    Premature atrial contractions    Pulmonary nodules     Past Surgical History:  Procedure Laterality Date   BACK SURGERY  2007   Decompression lumbar laminectomy and micro discectomy, L4-5   BIOPSY  08/20/2022   Procedure: BIOPSY;  Surgeon: Vida Rigger, MD;  Location: [facility] ENDOSCOPY;  Service: Gastroenterology;;   BRONCHIAL BIOPSY  12/28/2021   Procedure: BRONCHIAL BIOPSIES;  Surgeon: Leslye Peer, MD;  Location: [facility] ENDOSCOPY;  Service: Pulmonary;;   BRONCHIAL BRUSHINGS  12/28/2021   Procedure: BRONCHIAL BRUSHINGS;  Surgeon: Leslye Peer, MD;  Location: [facility] ENDOSCOPY;  Service: Pulmonary;;   BRONCHIAL NEEDLE ASPIRATION BIOPSY  12/28/2021   Procedure: BRONCHIAL NEEDLE ASPIRATION BIOPSIES;  Surgeon: Leslye Peer, MD;  Location: [facility] ENDOSCOPY;  Service: Pulmonary;;   BRONCHIAL WASHINGS  12/28/2021   Procedure: BRONCHIAL WASHINGS;  Surgeon: Leslye Peer, MD;  Location: [facility] ENDOSCOPY;  Service: Pulmonary;;   CARDIAC CATHETERIZATION  2007   patent stent to LAD and normal Cors   CARDIOVERSION N/A 05/10/2022   Procedure: CARDIOVERSION;  Surgeon: Sande Rives, MD;  Location: [facility] ENDOSCOPY;  Service: Cardiovascular;   Laterality: N/A;   CARDIOVERSION N/A 05/28/2022   Procedure: CARDIOVERSION;  Surgeon: Wendall Stade, MD;  Location: [facility] ENDOSCOPY;  Service: Cardiovascular;  Laterality: N/A;   CHOLECYSTECTOMY N/A 12/26/2021   Procedure: LAPAROSCOPIC CHOLECYSTECTOMY, LAPAROSCOPIC LYSIS OF ADHESIONS GREATER THAN 1 HOUR;  Surgeon: [name], MD;  Location: [facility] OR;  Service: General;  Laterality: N/A;   COLONOSCOPY WITH PROPOFOL N/A 08/20/2022   Procedure: COLONOSCOPY WITH PROPOFOL;  Surgeon: Vida Rigger, MD;  Location: [facility] ENDOSCOPY;  Service: Gastroenterology;  Laterality: N/A;   CORONARY ANGIOPLASTY WITH STENT PLACEMENT  2001   to LAD   CORONARY ANGIOPLASTY WITH STENT PLACEMENT  12/29/12   STEMI with promus DES to LAD   CORONARY ANGIOPLASTY WITH STENT PLACEMENT  01/05/13   STEMI with thrombosis in stent to LAD   ESOPHAGOGASTRODUODENOSCOPY (EGD) WITH PROPOFOL N/A 08/20/2022   Procedure: ESOPHAGOGASTRODUODENOSCOPY (EGD) WITH PROPOFOL;  Surgeon: Vida Rigger, MD;  Location: [facility] ENDOSCOPY;  Service: Gastroenterology;  Laterality: N/A;   FIDUCIAL MARKER PLACEMENT  12/28/2021   Procedure: FIDUCIAL MARKER PLACEMENT;  Surgeon: Leslye Peer, MD;  Location: [facility] ENDOSCOPY;  Service: Pulmonary;;   LAPAROSCOPIC PARTIAL COLECTOMY Right 08/23/2022   Procedure: LAPAROSCOPIC CONVERTED TO OPEN HEMICOLECTOMY;  Surgeon: Axel Filler, MD;  Location: [facility] OR;  Service: General;  Laterality: Right;   LEFT HEART CATH N/A 12/29/2012   Procedure: LEFT HEART CATH;  Surgeon: Corky Crafts, MD;  Location: [facility] CATH LAB;  Service: Cardiovascular;  Laterality: N/A;   LEFT HEART CATHETERIZATION WITH CORONARY ANGIOGRAM N/A 01/05/2013  Procedure: LEFT HEART CATHETERIZATION WITH CORONARY ANGIOGRAM;  Surgeon: Micheline Chapman, MD;  Location: [facility] CATH LAB;  Service: Cardiovascular;  Laterality: N/A;   LEFT HEART CATHETERIZATION WITH CORONARY ANGIOGRAM N/A 04/22/2013   Procedure: LEFT HEART CATHETERIZATION WITH CORONARY ANGIOGRAM;  Surgeon:  Lennette Bihari, MD;  Location: [facility] CATH LAB;  Service: Cardiovascular;  Laterality: N/A;   PARTIAL NEPHRECTOMY Left 1975   PATIENT ONLY HAS ONE KIDNEY   PERCUTANEOUS CORONARY STENT INTERVENTION (PCI-S)  12/29/2012   Procedure: PERCUTANEOUS CORONARY STENT INTERVENTION (PCI-S);  Surgeon: Corky Crafts, MD;  Location: [facility] CATH LAB;  Service: Cardiovascular;;   SUBMUCOSAL TATTOO INJECTION  08/20/2022   Procedure: SUBMUCOSAL TATTOO INJECTION;  Surgeon: Vida Rigger, MD;  Location: [facility] ENDOSCOPY;  Service: Gastroenterology;;   TEE WITHOUT CARDIOVERSION N/A 05/10/2022   Procedure: TRANSESOPHAGEAL ECHOCARDIOGRAM (TEE);  Surgeon: Sande Rives, MD;  Location: [facility] ENDOSCOPY;  Service: Cardiovascular;  Laterality: N/A;   TEE WITHOUT CARDIOVERSION N/A 05/28/2022   Procedure: TRANSESOPHAGEAL ECHOCARDIOGRAM (TEE);  Surgeon: Wendall Stade, MD;  Location: [facility] ENDOSCOPY;  Service: Cardiovascular;  Laterality: N/A;    Family History  Problem Relation Age of Onset   Alzheimer's disease Mother    CAD Father     Social History:  reports that he quit smoking about 23 years ago. His smoking use included cigarettes. He started smoking about 48 years ago. He has a 50 pack-year smoking history. His smokeless tobacco use includes chew. He reports that he does not currently use alcohol. He reports current drug use. Drug: Marijuana.  Allergies:  Allergies  Allergen Reactions   Levitra [Vardenafil] Other (See Comments)    Blurred vision   Aspirin Hives   Lipitor [Atorvastatin] Other (See Comments)    Muscle cramps; patient states he does not take medication at home    Medications: I have reviewed the patient's current medications.  Results for orders placed or performed during the hospital encounter of 09/10/22 (from the past 48 hour(s))  Resp panel by RT-PCR (RSV, Flu A&B, Covid) Anterior Nasal Swab     Status: None   Collection Time: 09/10/22  4:48 PM   Specimen: Anterior Nasal Swab  Result Value Ref  Range   SARS Coronavirus 2 by RT PCR NEGATIVE NEGATIVE   Influenza A by PCR NEGATIVE NEGATIVE   Influenza B by PCR NEGATIVE NEGATIVE    Comment: (NOTE) The Xpert Xpress SARS-CoV-2/FLU/RSV plus assay is intended as an aid in the diagnosis of influenza from Nasopharyngeal swab specimens and should not be used as a sole basis for treatment. Nasal washings and aspirates are unacceptable for Xpert Xpress SARS-CoV-2/FLU/RSV testing.  Fact Sheet for Patients: BloggerCourse.com  Fact Sheet for Healthcare Providers: SeriousBroker.it  This test is not yet approved or cleared by the [place] FDA and has been authorized for detection and/or diagnosis of SARS-CoV-2 by FDA under an Emergency Use Authorization (EUA). This EUA will remain in effect (meaning this test can be used) for the duration of the COVID-19 declaration under Section 564(b)(1) of the Act, 21 U.S.C. section 360bbb-3(b)(1), unless the authorization is terminated or revoked.     Resp Syncytial Virus by PCR NEGATIVE NEGATIVE    Comment: (NOTE) Fact Sheet for Patients: BloggerCourse.com  Fact Sheet for Healthcare Providers: SeriousBroker.it  This test is not yet approved or cleared by the [place] FDA and has been authorized for detection and/or diagnosis of SARS-CoV-2 by FDA under an Emergency Use Authorization (EUA). This EUA will remain in effect (meaning this test  can be used) for the duration of the COVID-19 declaration under Section 564(b)(1) of the Act, 21 U.S.C. section 360bbb-3(b)(1), unless the authorization is terminated or revoked.  Performed at [facility] Lab, [address] [place], [place] [address]   CBC with Differential     Status: Abnormal   Collection Time: 09/10/22  5:14 PM  Result Value Ref Range   WBC 21.6 (H) 4.0 - 10.5 K/uL   RBC 4.28 4.22 - 5.81 MIL/uL   Hemoglobin 10.7 (L) 13.0 -  17.0 g/dL   HCT 19.1 (L) 47.8 - 29.5 %   MCV 82.7 80.0 - 100.0 fL   MCH 25.0 (L) 26.0 - 34.0 pg   MCHC 30.2 30.0 - 36.0 g/dL   RDW 62.1 (H) 30.8 - 65.7 %   Platelets 535 (H) 150 - 400 K/uL   nRBC 0.0 0.0 - 0.2 %   Neutrophils Relative % 74 %   Neutro Abs 16.2 (H) 1.7 - 7.7 K/uL   Lymphocytes Relative 17 %   Lymphs Abs 3.6 0.7 - 4.0 K/uL   Monocytes Relative 8 %   Monocytes Absolute 1.6 (H) 0.1 - 1.0 K/uL   Eosinophils Relative 0 %   Eosinophils Absolute 0.0 0.0 - 0.5 K/uL   Basophils Relative 0 %   Basophils Absolute 0.1 0.0 - 0.1 K/uL   Immature Granulocytes 1 %   Abs Immature Granulocytes 0.11 (H) 0.00 - 0.07 K/uL    Comment: Performed at [facility], [address], [place], [place] [address]  Comprehensive metabolic panel     Status: Abnormal   Collection Time: 09/10/22  5:14 PM  Result Value Ref Range   Sodium 142 135 - 145 mmol/L   Potassium 3.5 3.5 - 5.1 mmol/L   Chloride 108 98 - 111 mmol/L   CO2 20 (L) 22 - 32 mmol/L   Glucose, Bld 120 (H) 70 - 99 mg/dL    Comment: Glucose reference range applies only to samples taken after fasting for at least 8 hours.   BUN 18 8 - 23 mg/dL   Creatinine, Ser 2.95 (H) 0.61 - 1.24 mg/dL   Calcium 9.4 8.9 - 28.4 mg/dL   Total Protein 7.4 6.5 - 8.1 g/dL   Albumin 3.7 3.5 - 5.0 g/dL   AST 25 15 - 41 U/L   ALT 15 0 - 44 U/L   Alkaline Phosphatase 81 38 - 126 U/L   Total Bilirubin 0.8 0.3 - 1.2 mg/dL   GFR, Estimated 41 (L) >60 mL/min    Comment: (NOTE) Calculated using the CKD-EPI Creatinine Equation (2021)    Anion gap 14 5 - 15    Comment: Performed at [facility], [address], [place], [place] [address]  Lipase, blood     Status: None   Collection Time: 09/10/22  5:14 PM  Result Value Ref Range   Lipase 34 11 - 51 U/L    Comment: Performed at [facility], [address], [place], [place] [address]  Lactic acid, plasma     Status: Abnormal   Collection Time: 09/10/22  7:57 PM  Result Value Ref Range    Lactic Acid, Venous 2.9 (HH) 0.5 - 1.9 mmol/L    Comment: CRITICAL RESULT CALLED TO, READ BACK BY AND VERIFIED WITH J.[email or web address],RN @2035  09/10/2022 [email or web address] Performed at [facility], [address] [place], [place] [address]     CT ABDOMEN PELVIS W CONTRAST  Result Date: 09/10/2022 CLINICAL DATA:  Abdominal pain, postop. EXAM: CT ABDOMEN AND PELVIS WITH CONTRAST TECHNIQUE: Multidetector CT imaging of the abdomen and pelvis was performed using the standard protocol following bolus administration of intravenous contrast. RADIATION DOSE REDUCTION: This exam was performed according to the departmental dose-optimization program which includes automated exposure control, adjustment of the mA and/or kV according to patient size and/or use of iterative reconstruction technique. CONTRAST:  60mL OMNIPAQUE IOHEXOL 350 MG/ML SOLN COMPARISON:  None Available. FINDINGS: Lower chest: No acute abnormality. Hepatobiliary: No focal liver abnormality is seen. Status post cholecystectomy. No biliary dilatation. Pancreas: Unremarkable. No pancreatic ductal dilatation or surrounding inflammatory changes. Spleen: Normal in size without focal abnormality. Adrenals/Urinary Tract: Adrenal glands are unremarkable. Postsurgical changes for prior left nephrectomy. Right kidney and ureter are unremarkable. Bladder is unremarkable. Stomach/Bowel: Stomach is within normal limits. Small bowel loops are normal in caliber. Appendix not visualized. Postsurgical changes in the ascending colon with mild wall thickening and adjacent fat stranding. No fluid collection. Colonic diverticulosis prominent in the sigmoid colon without evidence of acute diverticulitis. Vascular/Lymphatic: Aortic atherosclerosis. No enlarged abdominal or pelvic lymph nodes. Reproductive: Prostate is unremarkable. Other: No abdominal wall hernia or abnormality. No abdominopelvic ascites. Musculoskeletal: Compression deformity of the L1 vertebral body, a chronic  process. No acute osseous abnormality. Multilevel degenerate disc disease of the lumbar spine with facet joint arthropathy. IMPRESSION: 1. Postsurgical changes in the ascending colon with mild wall thickening and adjacent fat stranding. No fluid collection. 2. Colonic diverticulosis without evidence of acute diverticulitis. 3. Postsurgical changes for prior left nephrectomy. Right kidney and ureter are unremarkable. 4. Chronic compression deformity of the L1 vertebral body. Aortic Atherosclerosis (ICD10-I70.0). Electronically Signed   By: Larose Hires D.O.   On: 09/10/2022 19:59   DG Chest 2 View  Result Date: 09/10/2022 CLINICAL DATA:  Cough. EXAM: CHEST - 2 VIEW COMPARISON:  August 17, 2022 FINDINGS: The heart size and mediastinal contours are within normal limits. Both lungs are clear. The visualized skeletal structures are unremarkable. IMPRESSION: No active cardiopulmonary disease. Electronically Signed   By: Ted Mcalpine M.D.   On: 09/10/2022 17:54    ROS 10 point review of systems is negative except as listed above in HPI.   Physical Exam Blood pressure (!) 170/69, pulse 62, temperature 98.5 F (36.9 C), temperature source Oral, resp. rate 18, height 5\' 11"  (1.803 m), weight 96.4 kg, SpO2 100%. Constitutional: well-developed, well-nourished*** HEENT: pupils equal, round, reactive to light, 2***mm b/l, moist conjunctiva, external inspection of ears and nose normal, hearing {Blank single:19197::"intact","diminished","unable to be assessed"} Oropharynx: normal oropharyngeal mucosa, {Blank single:19197::"normal","poor"} dentition Neck: no thyromegaly, trachea midline, {Blank single:19197::"+","no","unable to assess"} midline cervical tenderness to palpation Chest: breath sounds equal bilaterally, {Blank single:19197::"normal","absent","labored"} respiratory effort, {Blank single:19197::"+","no"} midline or lateral chest wall tenderness to palpation/deformity Abdomen: soft, NT, no bruising,  no hepatosplenomegaly GU: {Blank single:19197::"no blood at urethral meatus of penis, no scrotal masses or abnormality","normal male genitalia"}  Back: no wounds, {Blank single:19197::"+","no","unable to assess"} thoracic/lumbar spine tenderness to palpation, {Blank single:19197::"+","no"} thoracic/lumbar spine stepoffs Rectal: {Blank single:19197::"good tone, no blood","deferred"} Extremities: 2+ radial and pedal pulses bilaterally, {Blank single:19197::"intact","unable to assess"} motor and sensation bilateral UE and LE, {Blank single:19197::"+","no"} peripheral edema MSK: {Blank single:19197::"normal","abnormal","unable to assess"} gait/station, no clubbing/cyanosis of fingers/toes, {Blank single:19197::"normal","limited","unable to assess"} ROM of all four extremities Skin: warm, dry, no rashes Psych: {Blank single:19197::"normal memory, normal mood/affect","unable to assess"}     Assessment/Plan: ***   Diamantina Monks, MD General and Trauma Surgery [facility]

## 2022-09-10 NOTE — ED Provider Notes (Signed)
[place] EMERGENCY DEPARTMENT AT [facility] Provider Note   CSN: [id number] Arrival date & time: 09/10/22  1648     History {Add pertinent medical, surgical, social history, OB history to HPI:1} Chief Complaint  Patient presents with   Fatigue    Brendan Butler is a 72 y.o. male with medical history includes coronary disease, cardiac stenting, a flutter on Eliquis, stage III kidney disease, left nephrectomy, recent open hemicolectomy in June 2024 for right colon mass, presenting to ED with 1 week of worsening confusion, nausea, vomiting, bloody stools.  The patient's daughter at the bedside is concerned because the patient has had fatigue, poor intake, cannot keep down any of his medications for 3 days, also having loose bowel movements that are blood-tinged.  She also ports she has had a dry cough.  She reports that his mental status appears "off".  Typically he is quite independent.  HPI     Home Medications Prior to Admission medications   Medication Sig Start Date End Date Taking? Authorizing Provider  acetaminophen (TYLENOL) 500 MG tablet Take 2 tablets (1,000 mg total) by mouth every 6 (six) hours. 08/28/22   Swayze, Ava, DO  ALPRAZolam (XANAX) 0.25 MG tablet Take 1 tablet (0.25 mg total) by mouth 2 (two) times daily as needed for anxiety. 08/28/22   Swayze, Ava, DO  amiodarone (PACERONE) 200 MG tablet Take 1/2 tablet (100 mg total) by mouth daily. 08/29/22   Swayze, Ava, DO  buPROPion (WELLBUTRIN XL) 150 MG 24 hr tablet Take 1 tablet (150 mg total) by mouth daily. 05/24/22   Elgergawy, Leana Roe, MD  empagliflozin (JARDIANCE) 10 MG TABS tablet Take 1 tablet (10 mg total) by mouth daily. 06/18/22   Marjie Skiff E, PA-C  ferrous sulfate 325 (65 FE) MG EC tablet Take 1 tablet (325 mg total) by mouth 2 (two) times daily. 07/31/22 09/03/22  Jerald Kief, MD  fluticasone (FLONASE) 50 MCG/ACT nasal spray Place 1 spray into both nostrils daily as needed for allergies. 12/24/21    [provider]  furosemide (LASIX) 40 MG tablet Take 1 tablet (40 mg total) by mouth 2 (two) times daily. 08/28/22   Swayze, Ava, DO  lactose free nutrition (BOOST PLUS) LIQD Take 237 mLs by mouth 2 (two) times daily between meals. 08/29/22   Swayze, Ava, DO  methocarbamol (ROBAXIN) 500 MG tablet Take 1 tablet (500 mg total) by mouth every 8 (eight) hours as needed for muscle spasms. 08/28/22   Swayze, Ava, DO  Multiple Vitamin (MULTIVITAMIN WITH MINERALS) TABS tablet Take 1 tablet by mouth daily. 08/29/22   Swayze, Ava, DO  oxyCODONE (OXY IR/ROXICODONE) 5 MG immediate release tablet Take 1 tablet (5 mg total) by mouth every 6 (six) hours as needed for moderate pain. 08/28/22   Swayze, Ava, DO  pantoprazole (PROTONIX) 40 MG tablet Take 1 tablet (40 mg total) by mouth daily. 05/11/22   Pokhrel, Rebekah Chesterfield, MD  PARoxetine (PAXIL) 30 MG tablet Take 30 mg by mouth daily.    [provider]  polyethylene glycol powder (GLYCOLAX/MIRALAX) 17 GM/SCOOP powder Take 17g (1 capful) as directed by mouth daily as needed for mild constipation. 08/28/22   Swayze, Ava, DO  potassium chloride SA (KLOR-CON M) 20 MEQ tablet Take 2 tablets (40 mEq total) by mouth daily. 08/28/22   Swayze, Ava, DO  pregabalin (LYRICA) 150 MG capsule Take 1 capsule (150 mg total) by mouth 2 (two) times daily. Patient taking differently: Take 150 mg by mouth  3 (three) times daily. 05/24/22   Elgergawy, Leana Roe, MD  QUEtiapine (SEROQUEL) 25 MG tablet Take 1 tablet (25 mg total) by mouth at bedtime. 05/10/22   Pokhrel, Rebekah Chesterfield, MD  Rivaroxaban (XARELTO) 15 MG TABS tablet Take 1 tablet (15 mg total) by mouth daily with supper. 08/28/22   Swayze, Ava, DO  rosuvastatin (CRESTOR) 20 MG tablet Take 1 tablet (20 mg total) by mouth daily. 06/18/22   Marjie Skiff E, PA-C  sacubitril-valsartan (ENTRESTO) 24-26 MG Take 1 tablet by mouth 2 (two) times daily. 06/18/22   Corrin Parker, PA-C  spironolactone (ALDACTONE) 25 MG tablet Take 1/2 tablet (12.5  mg total) by mouth daily. 06/18/22   Corrin Parker, PA-C      Allergies    Levitra [vardenafil], Aspirin, and Lipitor [atorvastatin]    Review of Systems   Review of Systems  Physical Exam Updated Vital Signs BP (!) 152/86 (BP Location: Right Arm)   Pulse 61   Temp 98.3 F (36.8 C)   Resp 19   SpO2 99%  Physical Exam Constitutional:      General: He is not in acute distress. HENT:     Head: Normocephalic and atraumatic.  Eyes:     Conjunctiva/sclera: Conjunctivae normal.     Pupils: Pupils are equal, round, and reactive to light.  Cardiovascular:     Rate and Rhythm: Normal rate. Rhythm irregular.  Pulmonary:     Effort: Pulmonary effort is normal. No respiratory distress.  Abdominal:     General: There is no distension.     Tenderness: There is no abdominal tenderness.  Skin:    General: Skin is warm and dry.  Neurological:     General: No focal deficit present.     Mental Status: He is alert. Mental status is at baseline.  Psychiatric:        Mood and Affect: Mood normal.        Behavior: Behavior normal.     ED Results / Procedures / Treatments   Labs (all labs ordered are listed, but only abnormal results are displayed) Labs Reviewed  CBC WITH DIFFERENTIAL/PLATELET - Abnormal; Notable for the following components:      Result Value   WBC 21.6 (*)    Hemoglobin 10.7 (*)    HCT 35.4 (*)    MCH 25.0 (*)    RDW 19.9 (*)    Platelets 535 (*)    Neutro Abs 16.2 (*)    Monocytes Absolute 1.6 (*)    Abs Immature Granulocytes 0.11 (*)    All other components within normal limits  COMPREHENSIVE METABOLIC PANEL - Abnormal; Notable for the following components:   CO2 20 (*)    Glucose, Bld 120 (*)    Creatinine, Ser 1.76 (*)    GFR, Estimated 41 (*)    All other components within normal limits  RESP PANEL BY RT-PCR (RSV, FLU A&B, COVID)  RVPGX2  CULTURE, BLOOD (ROUTINE X 2)  CULTURE, BLOOD (ROUTINE X 2)  LIPASE, BLOOD  URINALYSIS, ROUTINE W REFLEX  MICROSCOPIC  LACTIC ACID, PLASMA  LACTIC ACID, PLASMA    EKG None  Radiology DG Chest 2 View  Result Date: 09/10/2022 CLINICAL DATA:  Cough. EXAM: CHEST - 2 VIEW COMPARISON:  August 17, 2022 FINDINGS: The heart size and mediastinal contours are within normal limits. Both lungs are clear. The visualized skeletal structures are unremarkable. IMPRESSION: No active cardiopulmonary disease. Electronically Signed   By: Ted Mcalpine M.D.   On:  09/10/2022 17:54    Procedures Procedures  {Document cardiac monitor, telemetry assessment procedure when appropriate:1}  Medications Ordered in ED Medications  sodium chloride 0.9 % bolus 1,000 mL (has no administration in time range)  ondansetron (ZOFRAN) injection 4 mg (has no administration in time range)    ED Course/ Medical Decision Making/ A&P   {   Click here for ABCD2, HEART and other calculatorsREFRESH Note before signing :1}                          Medical Decision Making Amount and/or Complexity of Data Reviewed Labs: ordered.  Risk Prescription drug management.   This patient presents to the ED with concern for nausea, vomiting, diarrhea, fatigue, confusion. This involves an extensive number of treatment options, and is a complaint that carries with it a high risk of complications and morbidity.  The differential diagnosis includes metabolic derangement versus infection versus other  Co-morbidities that complicate the patient evaluation: History of surgery 1 month ago at high risk for postoperative infection, abscess  Additional history obtained from patient's daughter at the bedside  External records from outside source obtained and reviewed including hospital discharge records from June as noted above  I ordered and personally interpreted labs.  The pertinent results include:  ***  I ordered imaging studies including x-ray of the chest, CT abdomen pelvis I independently visualized and interpreted imaging which  showed *** I agree with the radiologist interpretation  The patient was maintained on a cardiac monitor.  I personally viewed and interpreted the cardiac monitored which showed an underlying rhythm of: ***  Per my interpretation the patient's ECG shows sinus rhythm versus rate controlled a flutter pattern  I ordered medication including IV fluids and Zofran for nausea and hydration  I have reviewed the patients home medicines and have made adjustments as needed  Test Considered: ***  I requested consultation with the ***,  and discussed lab and imaging findings as well as pertinent plan - they recommend: ***  After the interventions noted above, I reevaluated the patient and found that they have: {resolved/improved/worsened:23923::"improved"}  Social Determinants of Health:***  Dispostion:  After consideration of the diagnostic results and the patients response to treatment, I feel that the patent would benefit from ***.   {Document critical care time when appropriate:1} {Document review of labs and clinical decision tools ie heart score, Chads2Vasc2 etc:1}  {Document your independent review of radiology images, and any outside records:1} {Document your discussion with family members, caretakers, and with consultants:1} {Document social determinants of health affecting pt's care:1} {Document your decision making why or why not admission, treatments were needed:1} Final Clinical Impression(s) / ED Diagnoses Final diagnoses:  None    Rx / DC Orders ED Discharge Orders     None

## 2022-09-10 NOTE — ED Provider Triage Note (Signed)
Emergency Medicine Provider Triage Evaluation Note  Brendan Butler , a 72 y.o. male  was evaluated in triage.  Pt complains of nausea vomiting with failure to thrive.  Patient had surgery at the end of June to remove a "cancerous mass" from his colon.  He is was discharged from the hospital in July per family number in the room.  He has been having decreased appetite with nausea, questionable vomiting.  Reports he thinks he may have had some blood in his stool today as well.  Unsure about melena.  Reports nonproductive cough starting today as well.  Review of Systems  Positive:  Negative:   Physical Exam  BP (!) 152/86 (BP Location: Right Arm)   Pulse 61   Temp 98.3 F (36.8 C)   Resp 19   SpO2 99%  Gen:   Awake, no distress   Resp:  Normal effort  MSK:   Moves extremities without difficulty  Other:  Midline surgical incision present appears to be well-healing.  Diffuse abdominal tenderness palpation.  Soft.  No guarding or rebound.  Lung sounds are clear to auscultation bilaterally.  Medical Decision Making  Medically screening exam initiated at 5:11 PM.  Appropriate orders placed.  [name] was informed that the remainder of the evaluation will be completed by another provider, this initial triage assessment does not replace that evaluation, and the importance of remaining in the ED until their evaluation is complete.  CT of the belly ordered as well as labs.   Achille Rich, [place] 09/10/22 1713

## 2022-09-11 DIAGNOSIS — Z888 Allergy status to other drugs, medicaments and biological substances status: Secondary | ICD-10-CM | POA: Diagnosis not present

## 2022-09-11 DIAGNOSIS — K5289 Other specified noninfective gastroenteritis and colitis: Secondary | ICD-10-CM | POA: Diagnosis not present

## 2022-09-11 DIAGNOSIS — F32A Depression, unspecified: Secondary | ICD-10-CM | POA: Diagnosis present

## 2022-09-11 DIAGNOSIS — R109 Unspecified abdominal pain: Secondary | ICD-10-CM | POA: Diagnosis present

## 2022-09-11 DIAGNOSIS — I5032 Chronic diastolic (congestive) heart failure: Secondary | ICD-10-CM | POA: Diagnosis not present

## 2022-09-11 DIAGNOSIS — I2510 Atherosclerotic heart disease of native coronary artery without angina pectoris: Secondary | ICD-10-CM | POA: Diagnosis present

## 2022-09-11 DIAGNOSIS — Z8673 Personal history of transient ischemic attack (TIA), and cerebral infarction without residual deficits: Secondary | ICD-10-CM | POA: Diagnosis not present

## 2022-09-11 DIAGNOSIS — R112 Nausea with vomiting, unspecified: Secondary | ICD-10-CM | POA: Diagnosis not present

## 2022-09-11 DIAGNOSIS — I480 Paroxysmal atrial fibrillation: Secondary | ICD-10-CM | POA: Diagnosis present

## 2022-09-11 DIAGNOSIS — G4733 Obstructive sleep apnea (adult) (pediatric): Secondary | ICD-10-CM | POA: Diagnosis present

## 2022-09-11 DIAGNOSIS — E876 Hypokalemia: Secondary | ICD-10-CM | POA: Diagnosis present

## 2022-09-11 DIAGNOSIS — C189 Malignant neoplasm of colon, unspecified: Secondary | ICD-10-CM | POA: Diagnosis present

## 2022-09-11 DIAGNOSIS — I482 Chronic atrial fibrillation, unspecified: Secondary | ICD-10-CM | POA: Diagnosis present

## 2022-09-11 DIAGNOSIS — K529 Noninfective gastroenteritis and colitis, unspecified: Secondary | ICD-10-CM | POA: Diagnosis present

## 2022-09-11 DIAGNOSIS — I4892 Unspecified atrial flutter: Secondary | ICD-10-CM | POA: Diagnosis present

## 2022-09-11 DIAGNOSIS — I484 Atypical atrial flutter: Secondary | ICD-10-CM | POA: Diagnosis not present

## 2022-09-11 DIAGNOSIS — Z9049 Acquired absence of other specified parts of digestive tract: Secondary | ICD-10-CM | POA: Diagnosis not present

## 2022-09-11 DIAGNOSIS — R197 Diarrhea, unspecified: Secondary | ICD-10-CM | POA: Diagnosis not present

## 2022-09-11 DIAGNOSIS — I130 Hypertensive heart and chronic kidney disease with heart failure and stage 1 through stage 4 chronic kidney disease, or unspecified chronic kidney disease: Secondary | ICD-10-CM | POA: Diagnosis present

## 2022-09-11 DIAGNOSIS — R627 Adult failure to thrive: Secondary | ICD-10-CM | POA: Diagnosis present

## 2022-09-11 DIAGNOSIS — Z6829 Body mass index (BMI) 29.0-29.9, adult: Secondary | ICD-10-CM | POA: Diagnosis not present

## 2022-09-11 DIAGNOSIS — N1832 Chronic kidney disease, stage 3b: Secondary | ICD-10-CM | POA: Diagnosis present

## 2022-09-11 DIAGNOSIS — Z1152 Encounter for screening for COVID-19: Secondary | ICD-10-CM | POA: Diagnosis not present

## 2022-09-11 DIAGNOSIS — E872 Acidosis, unspecified: Secondary | ICD-10-CM | POA: Diagnosis present

## 2022-09-11 DIAGNOSIS — E861 Hypovolemia: Secondary | ICD-10-CM | POA: Diagnosis present

## 2022-09-11 DIAGNOSIS — D631 Anemia in chronic kidney disease: Secondary | ICD-10-CM | POA: Diagnosis present

## 2022-09-11 DIAGNOSIS — E785 Hyperlipidemia, unspecified: Secondary | ICD-10-CM | POA: Diagnosis present

## 2022-09-11 DIAGNOSIS — F419 Anxiety disorder, unspecified: Secondary | ICD-10-CM | POA: Diagnosis present

## 2022-09-11 DIAGNOSIS — Z886 Allergy status to analgesic agent status: Secondary | ICD-10-CM | POA: Diagnosis not present

## 2022-09-11 DIAGNOSIS — I5042 Chronic combined systolic (congestive) and diastolic (congestive) heart failure: Secondary | ICD-10-CM | POA: Diagnosis present

## 2022-09-11 DIAGNOSIS — A419 Sepsis, unspecified organism: Secondary | ICD-10-CM | POA: Diagnosis not present

## 2022-09-11 LAB — URINALYSIS, ROUTINE W REFLEX MICROSCOPIC
Bilirubin Urine: NEGATIVE
Glucose, UA: NEGATIVE mg/dL
Hgb urine dipstick: NEGATIVE
Ketones, ur: NEGATIVE mg/dL
Leukocytes,Ua: NEGATIVE
Nitrite: NEGATIVE
Protein, ur: NEGATIVE mg/dL
Specific Gravity, Urine: >1.046 — ABNORMAL HIGH (ref 1.005–1.030)
pH: 5 (ref 5.0–8.0)

## 2022-09-11 LAB — CBC
HCT: 27.6 % — ABNORMAL LOW (ref 39.0–52.0)
Hemoglobin: 8.3 g/dL — ABNORMAL LOW (ref 13.0–17.0)
MCH: 25.3 pg — ABNORMAL LOW (ref 26.0–34.0)
MCHC: 30.1 g/dL (ref 30.0–36.0)
MCV: 84.1 fL (ref 80.0–100.0)
Platelets: 319 10*3/uL (ref 150–400)
RBC: 3.28 MIL/uL — ABNORMAL LOW (ref 4.22–5.81)
RDW: 20.1 % — ABNORMAL HIGH (ref 11.5–15.5)
WBC: 11.8 10*3/uL — ABNORMAL HIGH (ref 4.0–10.5)
nRBC: 0 % (ref 0.0–0.2)

## 2022-09-11 LAB — BASIC METABOLIC PANEL
Anion gap: 8 (ref 5–15)
BUN: 17 mg/dL (ref 8–23)
CO2: 21 mmol/L — ABNORMAL LOW (ref 22–32)
Calcium: 8.3 mg/dL — ABNORMAL LOW (ref 8.9–10.3)
Chloride: 112 mmol/L — ABNORMAL HIGH (ref 98–111)
Creatinine, Ser: 1.39 mg/dL — ABNORMAL HIGH (ref 0.61–1.24)
GFR, Estimated: 55 mL/min — ABNORMAL LOW (ref >60)
Glucose, Bld: 98 mg/dL (ref 70–99)
Potassium: 3.3 mmol/L — ABNORMAL LOW (ref 3.5–5.1)
Sodium: 141 mmol/L (ref 135–145)

## 2022-09-11 LAB — GLUCOSE, CAPILLARY: Glucose-Capillary: 96 mg/dL (ref 70–99)

## 2022-09-11 LAB — MAGNESIUM: Magnesium: 2 mg/dL (ref 1.7–2.4)

## 2022-09-11 MED ORDER — RIVAROXABAN 15 MG PO TABS
15.0000 mg | ORAL_TABLET | Freq: Every day | ORAL | Status: DC
  Administered 2022-09-11: 15 mg via ORAL
  Filled 2022-09-11: qty 1

## 2022-09-11 MED ORDER — METHOCARBAMOL 500 MG PO TABS: 500.0000 mg | ORAL_TABLET | Freq: Three times a day (TID) | ORAL | Status: DC | PRN

## 2022-09-11 MED ORDER — MELATONIN 3 MG PO TABS
3.0000 mg | ORAL_TABLET | Freq: Every evening | ORAL | Status: DC | PRN
  Administered 2022-09-11 (×2): 3 mg via ORAL
  Filled 2022-09-11 (×2): qty 1

## 2022-09-11 MED ORDER — METRONIDAZOLE 500 MG/100ML IV SOLN
500.0000 mg | Freq: Two times a day (BID) | INTRAVENOUS | Status: DC
  Administered 2022-09-11 – 2022-09-12 (×4): 500 mg via INTRAVENOUS
  Filled 2022-09-11 (×4): qty 100

## 2022-09-11 MED ORDER — PANTOPRAZOLE SODIUM 40 MG PO TBEC
40.0000 mg | DELAYED_RELEASE_TABLET | Freq: Every day | ORAL | Status: DC
  Administered 2022-09-11 – 2022-09-13 (×3): 40 mg via ORAL
  Filled 2022-09-11 (×3): qty 1

## 2022-09-11 MED ORDER — OXYCODONE HCL 5 MG PO TABS
5.0000 mg | ORAL_TABLET | Freq: Four times a day (QID) | ORAL | Status: DC | PRN
  Administered 2022-09-11: 5 mg via ORAL
  Filled 2022-09-11: qty 1

## 2022-09-11 MED ORDER — SODIUM CHLORIDE 0.9 % IV SOLN
2.0000 g | INTRAVENOUS | Status: DC
  Administered 2022-09-11 – 2022-09-12 (×2): 2 g via INTRAVENOUS
  Filled 2022-09-11 (×2): qty 20

## 2022-09-11 MED ORDER — BUPROPION HCL ER (XL) 150 MG PO TB24
150.0000 mg | ORAL_TABLET | Freq: Every day | ORAL | Status: DC
  Administered 2022-09-11 – 2022-09-13 (×3): 150 mg via ORAL
  Filled 2022-09-11 (×4): qty 1

## 2022-09-11 MED ORDER — SODIUM CHLORIDE 0.9% FLUSH
3.0000 mL | Freq: Two times a day (BID) | INTRAVENOUS | Status: DC
  Administered 2022-09-11 – 2022-09-13 (×5): 3 mL via INTRAVENOUS

## 2022-09-11 MED ORDER — POTASSIUM CHLORIDE CRYS ER 20 MEQ PO TBCR
40.0000 meq | EXTENDED_RELEASE_TABLET | ORAL | Status: AC
  Filled 2022-09-11: qty 2

## 2022-09-11 MED ORDER — PROCHLORPERAZINE EDISYLATE 10 MG/2ML IJ SOLN: 5.0000 mg | INTRAMUSCULAR | Status: DC | PRN

## 2022-09-11 MED ORDER — PREGABALIN 25 MG PO CAPS: 150.0000 mg | ORAL_CAPSULE | Freq: Two times a day (BID) | ORAL | Status: DC

## 2022-09-11 MED ORDER — ACETAMINOPHEN 325 MG PO TABS
650.0000 mg | ORAL_TABLET | Freq: Four times a day (QID) | ORAL | Status: DC | PRN
  Administered 2022-09-12: 650 mg via ORAL
  Filled 2022-09-11: qty 2

## 2022-09-11 MED ORDER — PAROXETINE HCL 30 MG PO TABS
30.0000 mg | ORAL_TABLET | Freq: Every day | ORAL | Status: DC
  Administered 2022-09-11 – 2022-09-13 (×3): 30 mg via ORAL
  Filled 2022-09-11 (×4): qty 1

## 2022-09-11 MED ORDER — ROSUVASTATIN CALCIUM 20 MG PO TABS
20.0000 mg | ORAL_TABLET | Freq: Every day | ORAL | Status: DC
  Administered 2022-09-11 – 2022-09-13 (×3): 20 mg via ORAL
  Filled 2022-09-11 (×3): qty 1

## 2022-09-11 MED ORDER — QUETIAPINE FUMARATE 25 MG PO TABS
25.0000 mg | ORAL_TABLET | Freq: Every day | ORAL | Status: DC
  Administered 2022-09-11 – 2022-09-12 (×3): 25 mg via ORAL
  Filled 2022-09-11 (×3): qty 1

## 2022-09-11 MED ORDER — ACETAMINOPHEN 650 MG RE SUPP: 650.0000 mg | Freq: Four times a day (QID) | RECTAL | Status: DC | PRN

## 2022-09-11 MED ORDER — SACUBITRIL-VALSARTAN 24-26 MG PO TABS
1.0000 | ORAL_TABLET | Freq: Two times a day (BID) | ORAL | Status: DC
  Administered 2022-09-11 – 2022-09-13 (×5): 1 via ORAL
  Filled 2022-09-11 (×6): qty 1

## 2022-09-11 MED ORDER — AMIODARONE HCL 200 MG PO TABS
100.0000 mg | ORAL_TABLET | Freq: Every day | ORAL | Status: DC
  Administered 2022-09-11 – 2022-09-13 (×3): 100 mg via ORAL
  Filled 2022-09-11 (×3): qty 1

## 2022-09-11 MED ORDER — POTASSIUM CHLORIDE CRYS ER 20 MEQ PO TBCR
40.0000 meq | EXTENDED_RELEASE_TABLET | ORAL | Status: DC
  Administered 2022-09-11: 40 meq via ORAL

## 2022-09-11 NOTE — Progress Notes (Signed)
[facility] Progress Note Patient: Brendan Butler ZOX:[id number] DOB: 01-27-1951 DOA: 09/10/2022  DOS: the patient was seen and examined on 09/11/2022  Brief hospital course: PMH of CAD, chronic A-fib on Xarelto, depression, anxiety, CKD 3B, chronic HFpEF, adenocarcinoma of the colon with recent right hemicolectomy on 08/26/2022 presented to the hospital with complaints of nausea vomiting as well as fever. Found to have colitis.  Currently receiving IV antibiotic. Assessment and Plan: Concern for infectious colitis. Currently no diarrhea reported. No abdominal pain. Had leukocytosis, CT scan shows evidence of colonic wall thickening as well as fat stranding. General surgery consulted, do not think the patient has postop infection. Will continue antibiotic and monitor.  CAD Currently no symptoms. Continue.  Crestor continue Xarelto.  Chronic HFpEF. Volume status are appears to be adequate. Actually receiving IV hydration Holding home medication.  Chronic atrial fibrillation. Bradycardia. Seen on telemetry. Continue amiodarone. Continue anticoagulation.  Depression and anxiety. Appears somewhat withdrawn. Continue Celexa, Paxil and Seroquel.  CKD 3B. Renal function appears to be close to baseline.  Baseline in the range of 1.5.  Recently as high as 2.0. Monitor.  Normocytic anemia. Baseline hemoglobin around 12.  Presented with anemia in June.  After his surgery hemoglobin has been stable around 8.  Monitor.  Hypokalemia. Currently being replaced.  Lactic acidosis. Resolved with IV fluid.  L1 compression deformity. Appears to be chronic. Monitor.   Subjective: No acute complaint.  Abdominal pain improving.  No nausea or vomiting.  Physical Exam: In mild distress. No rash. Somewhat withdrawn on exam. Clear to auscultation S1-S2 present Bowel sound present. Mild diffuse tenderness primarily in the left side. No edema.  Data Reviewed: I have Reviewed  nursing notes, Vitals, and Lab results. Since last encounter, pertinent lab results CBC and BMP   . I have ordered test including CBC and BMP  .   Disposition: Status is: Inpatient Remains inpatient appropriate because: Needing IV antibiotics  Rivaroxaban (XARELTO) tablet 15 mg   Family Communication: No one at bedside Level of care: Telemetry Medical   Vitals:   09/11/22 0545 09/11/22 0830 09/11/22 0908 09/11/22 1617  BP: (!) 133/47 (!) 154/56 (!) 172/63 (!) 142/50  Pulse: (!) 52 (!) 47 (!) 59   Resp: 18  17 16   Temp: 98.2 F (36.8 C)  98 F (36.7 C) 98.2 F (36.8 C)  TempSrc: Oral  Oral   SpO2: 94% 95% 97% 98%  Weight:      Height:         Author: Lynden Oxford, MD 09/11/2022 7:14 PM  Please look on [email or web address] to find out who is on call.

## 2022-09-11 NOTE — Evaluation (Signed)
Physical Therapy Evaluation Patient Details Name: Brendan Butler MRN: [id number] DOB: 1950/05/18 Today's Date: 09/11/2022  History of Present Illness  72 y.o. male presents to [facility] hospital on 09/10/2022 with nausea, poor PO intake and AMS. Pt recently underwent R hemicolectomy on 08/26/2022 and was found to have adenocarcinoma. PMH includes aflutter, CAD, CKD III, L nephrectomy, CVA, HFrEF, HLD, HTN, OSA.  Clinical Impression  Pt presents to PT with deficits in cognition. Pt is mobilizing well, ambulating for limited community distances without significant balance deviations. Pt is able to don socks with increased time during session. PT notes cognitive deficits, as pt is not oriented to time, unaware of day and month. Pt is also unable to answer basic money management questions and has poor recall of multi-step commands throughout session. Pt will benefit from frequent mobilization during this admission to maintain independence in mobility. PT anticipates no post-acute PT needs.        Assistance Recommended at Discharge Frequent or constant Supervision/Assistance  If plan is discharge home, recommend the following:  Can travel by private vehicle  A little help with bathing/dressing/bathroom;Assistance with cooking/housework;Direct supervision/assist for medications management;Direct supervision/assist for financial management;Assist for transportation        Equipment Recommendations None recommended by PT  Recommendations for Other Services       Functional Status Assessment Patient has had a recent decline in their functional status and demonstrates the ability to make significant improvements in function in a reasonable and predictable amount of time.     Precautions / Restrictions Precautions Precautions: Fall Restrictions Weight Bearing Restrictions: No      Mobility  Bed Mobility Overal bed mobility: Modified Independent Bed Mobility: Supine to Sit, Sit to Supine      Supine to sit: Modified independent (Device/Increase time) Sit to supine: Modified independent (Device/Increase time)        Transfers Overall transfer level: Needs assistance Equipment used: None Transfers: Sit to/from Stand Sit to Stand: Supervision                Ambulation/Gait Ambulation/Gait assistance: Supervision Gait Distance (Feet): 400 Feet Assistive device: None Gait Pattern/deviations: WFL(Within Functional Limits) Gait velocity: functional Gait velocity interpretation: >2.62 ft/sec, indicative of community ambulatory   General Gait Details: steady step-through gait, supervision for direction  Stairs            Wheelchair Mobility     Tilt Bed    Modified Rankin (Stroke Patients Only)       Balance Overall balance assessment: Mild deficits observed, not formally tested                                           Pertinent Vitals/Pain Pain Assessment Pain Assessment: No/denies pain    Home Living Family/patient expects to be discharged to:: Private residence Living Arrangements: Alone Available Help at Discharge: Family;Available 24 hours/day;Friend(s) Type of Home: House Home Access: Stairs to enter Entrance Stairs-Rails: Can reach both;Left;Right Entrance Stairs-Number of Steps: 3   Home Layout: One level Home Equipment: Agricultural consultant (2 wheels);Rollator (4 wheels);Standard Walker;Cane - single point;Wheelchair - manual;Other (comment);Grab bars - tub/shower;Hand held shower head      Prior Function Prior Level of Function : Independent/Modified Independent;Driving;History of Falls (last six months)             Mobility Comments: amb without AD       Hand  Dominance   Dominant Hand: Right    Extremity/Trunk Assessment   Upper Extremity Assessment Upper Extremity Assessment: Overall WFL for tasks assessed    Lower Extremity Assessment Lower Extremity Assessment: Overall WFL for tasks assessed     Cervical / Trunk Assessment Cervical / Trunk Assessment: Normal  Communication   Communication: No difficulties  Cognition Arousal/Alertness: Awake/alert Behavior During Therapy: WFL for tasks assessed/performed Overall Cognitive Status: Impaired/Different from baseline Area of Impairment: Orientation, Memory, Following commands, Problem solving                 Orientation Level: Disoriented to, Time   Memory: Decreased short-term memory Following Commands: Follows multi-step commands inconsistently     Problem Solving: Slow processing          General Comments General comments (skin integrity, edema, etc.): VSS on RA    Exercises     Assessment/Plan    PT Assessment Patient needs continued PT services  PT Problem List Decreased activity tolerance;Decreased cognition       PT Treatment Interventions DME instruction;Functional mobility training;Therapeutic activities;Balance training;Cognitive remediation;Patient/family education;Gait training    PT Goals (Current goals can be found in the Care Plan section)  Acute Rehab PT Goals Patient Stated Goal: to return home PT Goal Formulation: With patient Time For Goal Achievement: 09/25/22 Potential to Achieve Goals: Good Additional Goals Additional Goal #1: Pt will score >19/24 on the DGI to indicate a reduced risk for falls Additional Goal #2: Pt will score >45/56 on the BERG to indicate a reduced risk for falls    Frequency Min 1X/week     Co-evaluation               AM-PAC PT "6 Clicks" Mobility  Outcome Measure Help needed turning from your back to your side while in a flat bed without using bedrails?: None Help needed moving from lying on your back to sitting on the side of a flat bed without using bedrails?: None Help needed moving to and from a bed to a chair (including a wheelchair)?: A Little Help needed standing up from a chair using your arms (e.g., wheelchair or bedside chair)?: A  Little Help needed to walk in hospital room?: A Little Help needed climbing 3-5 steps with a railing? : A Little 6 Click Score: 20    End of Session   Activity Tolerance: Patient tolerated treatment well Patient left: in bed;with call bell/phone within reach;with bed alarm set Nurse Communication: Mobility status PT Visit Diagnosis: Other abnormalities of gait and mobility (R26.89);Other symptoms and signs involving the nervous system (R29.898)    Time: 3295-1884 PT Time Calculation (min) (ACUTE ONLY): 14 min   Charges:   PT Evaluation $PT Eval Low Complexity: 1 Low   PT General Charges $$ ACUTE PT VISIT: 1 Visit       Arlyss Gandy, PT, DPT Acute Rehabilitation Office [phone]   Arlyss Gandy 09/11/2022, 10:26 AM

## 2022-09-11 NOTE — ED Notes (Signed)
ED TO INPATIENT HANDOFF REPORT  ED Nurse Name and Phone #:  Nehemiah Settle [phone]  S Name/Age/Gender Brendan Butler 72 y.o. male Room/Bed: 038C/038C  Code Status   Code Status: Full Code  Home/SNF/Other Home Patient oriented to: self, place, time, and situation Is this baseline? Yes   Triage Complete: Triage complete  Chief Complaint Abdominal pain with vomiting [R10.9, R11.10]  Triage Note Patient arrives with family from home-had recent surgery and since the weekend, patient having extreme fatigue, n/v, and non-productive cough. Patient isn't taking his medication at home d/t the nausea. Patient also adds that there was blood in the toilet when he had a bowel movement this morning.    Allergies Allergies  Allergen Reactions   Levitra [Vardenafil] Other (See Comments)    Blurred vision   Aspirin Hives   Lipitor [Atorvastatin] Other (See Comments)    Muscle cramps; patient states he does not take medication at home    Level of Care/Admitting Diagnosis ED Disposition     ED Disposition  Admit   Condition  --   Comment  Hospital Area: [facility] [[id number]]  Level of Care: Telemetry Medical [104]  May place patient in observation at [facility] or [facility] if equivalent level of care is available:: Yes  Covid Evaluation: Confirmed COVID Negative  Diagnosis: Abdominal pain with vomiting [[id number]]  Admitting Physician: Briscoe Deutscher [[id number]]  Attending Physician: Briscoe Deutscher [[id number]]          B Medical/Surgery History Past Medical History:  Diagnosis Date   Acute blood loss anemia 07/30/2022   Acute cholecystitis 12/25/2021   Altered mental status 05/06/2022   Atrial flutter with rapid ventricular response (HCC) 05/06/2022   Basal ganglia infarction (HCC)    Bradycardia    CAD (coronary artery disease)    Chronic HFrEF (heart failure with reduced ejection fraction) (HCC)    Chronic kidney disease, stage 3 (HCC)    Erectile dysfunction     Hyperlipidemia LDL goal <70    Hypertension    OSA (obstructive sleep apnea)    No CPAP use overnight   PAF (paroxysmal atrial fibrillation) (HCC)    Paroxysmal atrial flutter (HCC)    Premature atrial contractions    Pulmonary nodules    Past Surgical History:  Procedure Laterality Date   BACK SURGERY  2007   Decompression lumbar laminectomy and micro discectomy, L4-5   BIOPSY  08/20/2022   Procedure: BIOPSY;  Surgeon: Vida Rigger, MD;  Location: [facility] ENDOSCOPY;  Service: Gastroenterology;;   BRONCHIAL BIOPSY  12/28/2021   Procedure: BRONCHIAL BIOPSIES;  Surgeon: Leslye Peer, MD;  Location: [facility] ENDOSCOPY;  Service: Pulmonary;;   BRONCHIAL BRUSHINGS  12/28/2021   Procedure: BRONCHIAL BRUSHINGS;  Surgeon: Leslye Peer, MD;  Location: [facility] ENDOSCOPY;  Service: Pulmonary;;   BRONCHIAL NEEDLE ASPIRATION BIOPSY  12/28/2021   Procedure: BRONCHIAL NEEDLE ASPIRATION BIOPSIES;  Surgeon: Leslye Peer, MD;  Location: [facility] ENDOSCOPY;  Service: Pulmonary;;   BRONCHIAL WASHINGS  12/28/2021   Procedure: BRONCHIAL WASHINGS;  Surgeon: Leslye Peer, MD;  Location: [facility] ENDOSCOPY;  Service: Pulmonary;;   CARDIAC CATHETERIZATION  2007   patent stent to LAD and normal Cors   CARDIOVERSION N/A 05/10/2022   Procedure: CARDIOVERSION;  Surgeon: Sande Rives, MD;  Location: [facility] ENDOSCOPY;  Service: Cardiovascular;  Laterality: N/A;   CARDIOVERSION N/A 05/28/2022   Procedure: CARDIOVERSION;  Surgeon: Wendall Stade, MD;  Location: [facility] ENDOSCOPY;  Service: Cardiovascular;  Laterality: N/A;  CHOLECYSTECTOMY N/A 12/26/2021   Procedure: LAPAROSCOPIC CHOLECYSTECTOMY, LAPAROSCOPIC LYSIS OF ADHESIONS GREATER THAN 1 HOUR;  Surgeon: [name], MD;  Location: [facility] OR;  Service: General;  Laterality: N/A;   COLONOSCOPY WITH PROPOFOL N/A 08/20/2022   Procedure: COLONOSCOPY WITH PROPOFOL;  Surgeon: Vida Rigger, MD;  Location: [facility] ENDOSCOPY;  Service: Gastroenterology;  Laterality: N/A;   CORONARY ANGIOPLASTY  WITH STENT PLACEMENT  2001   to LAD   CORONARY ANGIOPLASTY WITH STENT PLACEMENT  12/29/12   STEMI with promus DES to LAD   CORONARY ANGIOPLASTY WITH STENT PLACEMENT  01/05/13   STEMI with thrombosis in stent to LAD   ESOPHAGOGASTRODUODENOSCOPY (EGD) WITH PROPOFOL N/A 08/20/2022   Procedure: ESOPHAGOGASTRODUODENOSCOPY (EGD) WITH PROPOFOL;  Surgeon: Vida Rigger, MD;  Location: [facility] ENDOSCOPY;  Service: Gastroenterology;  Laterality: N/A;   FIDUCIAL MARKER PLACEMENT  12/28/2021   Procedure: FIDUCIAL MARKER PLACEMENT;  Surgeon: Leslye Peer, MD;  Location: [facility] ENDOSCOPY;  Service: Pulmonary;;   LAPAROSCOPIC PARTIAL COLECTOMY Right 08/23/2022   Procedure: LAPAROSCOPIC CONVERTED TO OPEN HEMICOLECTOMY;  Surgeon: Axel Filler, MD;  Location: [facility] OR;  Service: General;  Laterality: Right;   LEFT HEART CATH N/A 12/29/2012   Procedure: LEFT HEART CATH;  Surgeon: Corky Crafts, MD;  Location: [facility] CATH LAB;  Service: Cardiovascular;  Laterality: N/A;   LEFT HEART CATHETERIZATION WITH CORONARY ANGIOGRAM N/A 01/05/2013   Procedure: LEFT HEART CATHETERIZATION WITH CORONARY ANGIOGRAM;  Surgeon: Micheline Chapman, MD;  Location: [facility] CATH LAB;  Service: Cardiovascular;  Laterality: N/A;   LEFT HEART CATHETERIZATION WITH CORONARY ANGIOGRAM N/A 04/22/2013   Procedure: LEFT HEART CATHETERIZATION WITH CORONARY ANGIOGRAM;  Surgeon: Lennette Bihari, MD;  Location: [facility] CATH LAB;  Service: Cardiovascular;  Laterality: N/A;   PARTIAL NEPHRECTOMY Left 1975   PATIENT ONLY HAS ONE KIDNEY   PERCUTANEOUS CORONARY STENT INTERVENTION (PCI-S)  12/29/2012   Procedure: PERCUTANEOUS CORONARY STENT INTERVENTION (PCI-S);  Surgeon: Corky Crafts, MD;  Location: [facility] CATH LAB;  Service: Cardiovascular;;   SUBMUCOSAL TATTOO INJECTION  08/20/2022   Procedure: SUBMUCOSAL TATTOO INJECTION;  Surgeon: Vida Rigger, MD;  Location: [facility] ENDOSCOPY;  Service: Gastroenterology;;   TEE WITHOUT CARDIOVERSION N/A 05/10/2022   Procedure:  TRANSESOPHAGEAL ECHOCARDIOGRAM (TEE);  Surgeon: Sande Rives, MD;  Location: [facility] ENDOSCOPY;  Service: Cardiovascular;  Laterality: N/A;   TEE WITHOUT CARDIOVERSION N/A 05/28/2022   Procedure: TRANSESOPHAGEAL ECHOCARDIOGRAM (TEE);  Surgeon: Wendall Stade, MD;  Location: [facility] ENDOSCOPY;  Service: Cardiovascular;  Laterality: N/A;     A IV Location/Drains/Wounds Patient Lines/Drains/Airways Status     Active Line/Drains/Airways     Name Placement date Placement time Site Days   Peripheral IV 09/10/22 Anterior;Left Forearm 09/10/22  1926  Forearm  1   Incision - 3 Ports Abdomen 08/23/22  1610  -- 19   Wound / Incision (Open or Dehisced) 05/26/22 Laceration Toe (Comment  which one) Right Bottom of R great toe where it flexes and meets ground has split open 05/26/22  1800  Toe (Comment  which one)  108            Intake/Output Last 24 hours No intake or output data in the 24 hours ending 09/11/22 9604  Labs/Imaging Results for orders placed or performed during the hospital encounter of 09/10/22 (from the past 48 hour(s))  Resp panel by RT-PCR (RSV, Flu A&B, Covid) Anterior Nasal Swab     Status: None   Collection Time: 09/10/22  4:48 PM   Specimen: Anterior Nasal Swab  Result Value  Ref Range   SARS Coronavirus 2 by RT PCR NEGATIVE NEGATIVE   Influenza A by PCR NEGATIVE NEGATIVE   Influenza B by PCR NEGATIVE NEGATIVE    Comment: (NOTE) The Xpert Xpress SARS-CoV-2/FLU/RSV plus assay is intended as an aid in the diagnosis of influenza from Nasopharyngeal swab specimens and should not be used as a sole basis for treatment. Nasal washings and aspirates are unacceptable for Xpert Xpress SARS-CoV-2/FLU/RSV testing.  Fact Sheet for Patients: BloggerCourse.com  Fact Sheet for Healthcare Providers: SeriousBroker.it  This test is not yet approved or cleared by the [place] FDA and has been authorized for detection and/or  diagnosis of SARS-CoV-2 by FDA under an Emergency Use Authorization (EUA). This EUA will remain in effect (meaning this test can be used) for the duration of the COVID-19 declaration under Section 564(b)(1) of the Act, 21 U.S.C. section 360bbb-3(b)(1), unless the authorization is terminated or revoked.     Resp Syncytial Virus by PCR NEGATIVE NEGATIVE    Comment: (NOTE) Fact Sheet for Patients: BloggerCourse.com  Fact Sheet for Healthcare Providers: SeriousBroker.it  This test is not yet approved or cleared by the [place] FDA and has been authorized for detection and/or diagnosis of SARS-CoV-2 by FDA under an Emergency Use Authorization (EUA). This EUA will remain in effect (meaning this test can be used) for the duration of the COVID-19 declaration under Section 564(b)(1) of the Act, 21 U.S.C. section 360bbb-3(b)(1), unless the authorization is terminated or revoked.  Performed at [facility], [address], [place], [place] [address]   CBC with Differential     Status: Abnormal   Collection Time: 09/10/22  5:14 PM  Result Value Ref Range   WBC 21.6 (H) 4.0 - 10.5 K/uL   RBC 4.28 4.22 - 5.81 MIL/uL   Hemoglobin 10.7 (L) 13.0 - 17.0 g/dL   HCT 69.6 (L) 29.5 - 28.4 %   MCV 82.7 80.0 - 100.0 fL   MCH 25.0 (L) 26.0 - 34.0 pg   MCHC 30.2 30.0 - 36.0 g/dL   RDW 13.2 (H) 44.0 - 10.2 %   Platelets 535 (H) 150 - 400 K/uL   nRBC 0.0 0.0 - 0.2 %   Neutrophils Relative % 74 %   Neutro Abs 16.2 (H) 1.7 - 7.7 K/uL   Lymphocytes Relative 17 %   Lymphs Abs 3.6 0.7 - 4.0 K/uL   Monocytes Relative 8 %   Monocytes Absolute 1.6 (H) 0.1 - 1.0 K/uL   Eosinophils Relative 0 %   Eosinophils Absolute 0.0 0.0 - 0.5 K/uL   Basophils Relative 0 %   Basophils Absolute 0.1 0.0 - 0.1 K/uL   Immature Granulocytes 1 %   Abs Immature Granulocytes 0.11 (H) 0.00 - 0.07 K/uL    Comment: Performed at [facility], [address] [place], [place] [address]  Comprehensive metabolic panel     Status: Abnormal   Collection Time: 09/10/22  5:14 PM  Result Value Ref Range   Sodium 142 135 - 145 mmol/L   Potassium 3.5 3.5 - 5.1 mmol/L   Chloride 108 98 - 111 mmol/L   CO2 20 (L) 22 - 32 mmol/L   Glucose, Bld 120 (H) 70 - 99 mg/dL    Comment: Glucose reference range applies only to samples taken after fasting for at least 8 hours.   BUN 18 8 - 23 mg/dL   Creatinine, Ser 6.44 (H) 0.61 - 1.24 mg/dL   Calcium 9.4 8.9 - 10.3  mg/dL   Total Protein 7.4 6.5 - 8.1 g/dL   Albumin 3.7 3.5 - 5.0 g/dL   AST 25 15 - 41 U/L   ALT 15 0 - 44 U/L   Alkaline Phosphatase 81 38 - 126 U/L   Total Bilirubin 0.8 0.3 - 1.2 mg/dL   GFR, Estimated 41 (L) >60 mL/min    Comment: (NOTE) Calculated using the CKD-EPI Creatinine Equation (2021)    Anion gap 14 5 - 15    Comment: Performed at [facility], [address], [place], [place] [address]  Lipase, blood     Status: None   Collection Time: 09/10/22  5:14 PM  Result Value Ref Range   Lipase 34 11 - 51 U/L    Comment: Performed at [facility], [address] [place], [place] [address]  Lactic acid, plasma     Status: Abnormal   Collection Time: 09/10/22  7:57 PM  Result Value Ref Range   Lactic Acid, Venous 2.9 (HH) 0.5 - 1.9 mmol/L    Comment: CRITICAL RESULT CALLED TO, READ BACK BY AND VERIFIED WITH J.TRICHE,RN @2035  09/10/2022 [email or web address] Performed at [facility], [address] [place], [place] [address]   Lactic acid, plasma     Status: None   Collection Time: 09/10/22 10:59 PM  Result Value Ref Range   Lactic Acid, Venous 1.3 0.5 - 1.9 mmol/L    Comment: Performed at [facility], [address], [place], [place] [address]  Urinalysis, Routine w reflex microscopic -Urine, Clean Catch     Status: Abnormal   Collection Time: 09/11/22  2:38 AM  Result Value Ref Range   Color, Urine YELLOW YELLOW   APPearance CLEAR CLEAR   Specific Gravity, Urine >1.046  (H) 1.005 - 1.030   pH 5.0 5.0 - 8.0   Glucose, UA NEGATIVE NEGATIVE mg/dL   Hgb urine dipstick NEGATIVE NEGATIVE   Bilirubin Urine NEGATIVE NEGATIVE   Ketones, ur NEGATIVE NEGATIVE mg/dL   Protein, ur NEGATIVE NEGATIVE mg/dL   Nitrite NEGATIVE NEGATIVE   Leukocytes,Ua NEGATIVE NEGATIVE    Comment: Performed at [facility], [address] [place], [place] [address]  Basic metabolic panel     Status: Abnormal   Collection Time: 09/11/22  3:27 AM  Result Value Ref Range   Sodium 141 135 - 145 mmol/L   Potassium 3.3 (L) 3.5 - 5.1 mmol/L   Chloride 112 (H) 98 - 111 mmol/L   CO2 21 (L) 22 - 32 mmol/L   Glucose, Bld 98 70 - 99 mg/dL    Comment: Glucose reference range applies only to samples taken after fasting for at least 8 hours.   BUN 17 8 - 23 mg/dL   Creatinine, Ser 1.30 (H) 0.61 - 1.24 mg/dL   Calcium 8.3 (L) 8.9 - 10.3 mg/dL   GFR, Estimated 55 (L) >60 mL/min    Comment: (NOTE) Calculated using the CKD-EPI Creatinine Equation (2021)    Anion gap 8 5 - 15    Comment: Performed at [facility], [address], [place], [place] [address]  Magnesium     Status: None   Collection Time: 09/11/22  3:27 AM  Result Value Ref Range   Magnesium 2.0 1.7 - 2.4 mg/dL    Comment: Performed at [facility], [address] [place], [place] [address]  CBC     Status: Abnormal   Collection Time: 09/11/22  3:27 AM  Result Value Ref Range   WBC 11.8 (H) 4.0 - 10.5 K/uL   RBC 3.28 (L) 4.22 - 5.81 MIL/uL   Hemoglobin 8.3 (L) 13.0 - 17.0 g/dL   HCT 24.4 (L) 01.0 - 27.2 %   MCV 84.1 80.0 - 100.0 fL   MCH 25.3 (L) 26.0 - 34.0 pg   MCHC 30.1 30.0 - 36.0 g/dL   RDW 53.6 (H) 64.4 - 03.4 %   Platelets 319 150 - 400 K/uL   nRBC 0.0 0.0 - 0.2 %    Comment: Performed at [facility], [address] [place], [place] [address]   CT ABDOMEN PELVIS W CONTRAST  Result Date: 09/10/2022 CLINICAL DATA:  Abdominal pain, postop. EXAM: CT ABDOMEN AND PELVIS WITH CONTRAST  TECHNIQUE: Multidetector CT imaging of the abdomen and pelvis was performed using the standard protocol following bolus administration of intravenous contrast. RADIATION DOSE REDUCTION: This exam was performed according to the departmental dose-optimization program which includes automated exposure control, adjustment of the mA and/or kV according to patient size and/or use of iterative reconstruction technique. CONTRAST:  60mL OMNIPAQUE IOHEXOL 350 MG/ML SOLN COMPARISON:  None Available. FINDINGS: Lower chest: No acute abnormality. Hepatobiliary: No focal liver abnormality is seen. Status post cholecystectomy. No biliary dilatation. Pancreas: Unremarkable. No pancreatic ductal dilatation or surrounding inflammatory changes. Spleen: Normal in size without focal abnormality. Adrenals/Urinary Tract: Adrenal glands are unremarkable. Postsurgical changes for prior left nephrectomy. Right kidney and ureter are unremarkable. Bladder is unremarkable. Stomach/Bowel: Stomach is within normal limits. Small bowel loops are normal in caliber. Appendix not visualized. Postsurgical changes in the ascending colon with mild wall thickening and adjacent fat stranding. No fluid collection. Colonic diverticulosis prominent in the sigmoid colon without evidence of acute diverticulitis. Vascular/Lymphatic: Aortic atherosclerosis. No enlarged abdominal or pelvic lymph nodes. Reproductive: Prostate is unremarkable. Other: No abdominal wall hernia or abnormality. No abdominopelvic ascites. Musculoskeletal: Compression deformity of the L1 vertebral body, a chronic process. No acute osseous abnormality. Multilevel degenerate disc disease of the lumbar spine with facet joint arthropathy. IMPRESSION: 1. Postsurgical changes in the ascending colon with mild wall thickening and adjacent fat stranding. No fluid collection. 2. Colonic diverticulosis without evidence of acute diverticulitis. 3. Postsurgical changes for prior left nephrectomy. Right  kidney and ureter are unremarkable. 4. Chronic compression deformity of the L1 vertebral body. Aortic Atherosclerosis (ICD10-I70.0). Electronically Signed   By: Larose Hires D.O.   On: 09/10/2022 19:59   DG Chest 2 View  Result Date: 09/10/2022 CLINICAL DATA:  Cough. EXAM: CHEST - 2 VIEW COMPARISON:  August 17, 2022 FINDINGS: The heart size and mediastinal contours are within normal limits. Both lungs are clear. The visualized skeletal structures are unremarkable. IMPRESSION: No active cardiopulmonary disease. Electronically Signed   By: Ted Mcalpine M.D.   On: 09/10/2022 17:54    Pending Labs Unresulted Labs (From admission, onward)     Start     Ordered   09/11/22 0500  Basic metabolic panel  Daily,   R      09/11/22 0055   09/11/22 0500  CBC  Daily,   R      09/11/22 0055   09/10/22 1843  Blood culture (routine x 2)  BLOOD CULTURE X 2,   R      09/10/22 1842            Vitals/Pain Today's Vitals   09/11/22 0245 09/11/22 0537 09/11/22 0545 09/11/22 0743  BP: (!) 148/61  (!) 133/47   Pulse: Marland Kitchen)  53  (!) 52   Resp: 18  18   Temp: 98.2 F (36.8 C)  98.2 F (36.8 C)   TempSrc: Oral  Oral   SpO2: 97%  94%   Weight:      Height:      PainSc:  0-No pain 0-No pain 4     Isolation Precautions No active isolations  Medications Medications  melatonin tablet 3 mg (3 mg Oral Given 09/11/22 0020)  oxyCODONE (Oxy IR/ROXICODONE) immediate release tablet 5 mg (5 mg Oral Given 09/11/22 0130)  amiodarone (PACERONE) tablet 100 mg (has no administration in time range)  rosuvastatin (CRESTOR) tablet 20 mg (has no administration in time range)  sacubitril-valsartan (ENTRESTO) 24-26 mg per tablet (has no administration in time range)  buPROPion (WELLBUTRIN XL) 24 hr tablet 150 mg (has no administration in time range)  PARoxetine (PAXIL) tablet 30 mg (has no administration in time range)  QUEtiapine (SEROQUEL) tablet 25 mg (25 mg Oral Given 09/11/22 0130)  pantoprazole (PROTONIX) EC  tablet 40 mg (has no administration in time range)  methocarbamol (ROBAXIN) tablet 500 mg (has no administration in time range)  Rivaroxaban (XARELTO) tablet 15 mg (has no administration in time range)  sodium chloride flush (NS) 0.9 % injection 3 mL (3 mLs Intravenous Given 09/11/22 0100)  acetaminophen (TYLENOL) tablet 650 mg (has no administration in time range)    Or  acetaminophen (TYLENOL) suppository 650 mg (has no administration in time range)  prochlorperazine (COMPAZINE) injection 5 mg (has no administration in time range)  cefTRIAXone (ROCEPHIN) 2 g in sodium chloride 0.9 % 100 mL IVPB (has no administration in time range)  metroNIDAZOLE (FLAGYL) IVPB 500 mg (has no administration in time range)  potassium chloride SA (KLOR-CON M) CR tablet 40 mEq (has no administration in time range)  sodium chloride 0.9 % bolus 1,000 mL (0 mLs Intravenous Stopped 09/10/22 2315)  ondansetron (ZOFRAN) injection 4 mg (4 mg Intravenous Given 09/10/22 2028)  iohexol (OMNIPAQUE) 350 MG/ML injection 60 mL (60 mLs Intravenous Contrast Given 09/10/22 1926)  cefTRIAXone (ROCEPHIN) 1 g in sodium chloride 0.9 % 100 mL IVPB (0 g Intravenous Stopped 09/10/22 2254)  metroNIDAZOLE (FLAGYL) IVPB 500 mg (0 mg Intravenous Stopped 09/11/22 0004)  sodium chloride 0.9 % bolus 1,000 mL (0 mLs Intravenous Stopped 09/11/22 0004)  vancomycin (VANCOREADY) IVPB 1750 mg/350 mL (0 mg Intravenous Stopped 09/11/22 0433)    Mobility walks with person assist     Focused Assessments    R Recommendations: See Admitting Provider Note  Report given to:   Additional Notes:  clear liquid diet, resting comfortably no N/V episodes

## 2022-09-11 NOTE — H&P (Addendum)
History and Physical    Brendan Butler JWJ:[id number] DOB: 1950/05/18 DOA: 09/10/2022  PCP: [name], MD   Patient coming from: Home   Chief Complaint: N/V, loose stools, subjective fever, fatigue   HPI: Brendan Butler is a pleasant 72 y.o. male with medical history significant for CAD, atrial flutter on Xarelto, depression, anxiety, CKD 3B, chronic HFpEF, and adenocarcinoma of the colon status post right hemicolectomy on 08/26/2022 who presents with nausea, vomiting, subjective fever, chills, and fatigue.  Patient reports that he has had some fatigue and mild nausea since his surgery but is now experiencing more severe nausea with recurrent bouts of nonbloody vomiting.  His fatigue has worsened.  He also reports subjective fever.  He denies abdominal pain, chest pain, or shortness of breath but reports a new mild nonproductive cough.  ED Course: Upon arrival to the ED, patient is found to be afebrile and saturating well on room air with normal heart rate and stable blood pressure.  Labs are most notable for lactic acid 2.9, WBC 21,600, hemoglobin 10.7, and platelets 535,000.  Surgery (Dr. Lowell Bouton) was consulted by the ED physician, blood cultures were collected, and the patient was given 2 L of NS, vancomycin, Rocephin, Flagyl, and Zofran.  Review of Systems:  All other systems reviewed and apart from HPI, are negative.  Past Medical History:  Diagnosis Date   Acute blood loss anemia 07/30/2022   Acute cholecystitis 12/25/2021   Altered mental status 05/06/2022   Atrial flutter with rapid ventricular response (HCC) 05/06/2022   Basal ganglia infarction (HCC)    Bradycardia    CAD (coronary artery disease)    Chronic HFrEF (heart failure with reduced ejection fraction) (HCC)    Chronic kidney disease, stage 3 (HCC)    Erectile dysfunction    Hyperlipidemia LDL goal <70    Hypertension    OSA (obstructive sleep apnea)    No CPAP use overnight   PAF (paroxysmal atrial  fibrillation) (HCC)    Paroxysmal atrial flutter (HCC)    Premature atrial contractions    Pulmonary nodules     Past Surgical History:  Procedure Laterality Date   BACK SURGERY  2007   Decompression lumbar laminectomy and micro discectomy, L4-5   BIOPSY  08/20/2022   Procedure: BIOPSY;  Surgeon: Vida Rigger, MD;  Location: [facility] ENDOSCOPY;  Service: Gastroenterology;;   BRONCHIAL BIOPSY  12/28/2021   Procedure: BRONCHIAL BIOPSIES;  Surgeon: Leslye Peer, MD;  Location: [facility] ENDOSCOPY;  Service: Pulmonary;;   BRONCHIAL BRUSHINGS  12/28/2021   Procedure: BRONCHIAL BRUSHINGS;  Surgeon: Leslye Peer, MD;  Location: [facility] ENDOSCOPY;  Service: Pulmonary;;   BRONCHIAL NEEDLE ASPIRATION BIOPSY  12/28/2021   Procedure: BRONCHIAL NEEDLE ASPIRATION BIOPSIES;  Surgeon: Leslye Peer, MD;  Location: [facility] ENDOSCOPY;  Service: Pulmonary;;   BRONCHIAL WASHINGS  12/28/2021   Procedure: BRONCHIAL WASHINGS;  Surgeon: Leslye Peer, MD;  Location: [facility] ENDOSCOPY;  Service: Pulmonary;;   CARDIAC CATHETERIZATION  2007   patent stent to LAD and normal Cors   CARDIOVERSION N/A 05/10/2022   Procedure: CARDIOVERSION;  Surgeon: Sande Rives, MD;  Location: [facility] ENDOSCOPY;  Service: Cardiovascular;  Laterality: N/A;   CARDIOVERSION N/A 05/28/2022   Procedure: CARDIOVERSION;  Surgeon: Wendall Stade, MD;  Location: [facility] ENDOSCOPY;  Service: Cardiovascular;  Laterality: N/A;   CHOLECYSTECTOMY N/A 12/26/2021   Procedure: LAPAROSCOPIC CHOLECYSTECTOMY, LAPAROSCOPIC LYSIS OF ADHESIONS GREATER THAN 1 HOUR;  Surgeon: [name], MD;  Location: [facility] OR;  Service: General;  Laterality: N/A;   COLONOSCOPY WITH PROPOFOL N/A 08/20/2022   Procedure: COLONOSCOPY WITH PROPOFOL;  Surgeon: Vida Rigger, MD;  Location: [facility] ENDOSCOPY;  Service: Gastroenterology;  Laterality: N/A;   CORONARY ANGIOPLASTY WITH STENT PLACEMENT  2001   to LAD   CORONARY ANGIOPLASTY WITH STENT PLACEMENT  12/29/12   STEMI with promus DES to LAD   CORONARY  ANGIOPLASTY WITH STENT PLACEMENT  01/05/13   STEMI with thrombosis in stent to LAD   ESOPHAGOGASTRODUODENOSCOPY (EGD) WITH PROPOFOL N/A 08/20/2022   Procedure: ESOPHAGOGASTRODUODENOSCOPY (EGD) WITH PROPOFOL;  Surgeon: Vida Rigger, MD;  Location: [facility] ENDOSCOPY;  Service: Gastroenterology;  Laterality: N/A;   FIDUCIAL MARKER PLACEMENT  12/28/2021   Procedure: FIDUCIAL MARKER PLACEMENT;  Surgeon: Leslye Peer, MD;  Location: [facility] ENDOSCOPY;  Service: Pulmonary;;   LAPAROSCOPIC PARTIAL COLECTOMY Right 08/23/2022   Procedure: LAPAROSCOPIC CONVERTED TO OPEN HEMICOLECTOMY;  Surgeon: Axel Filler, MD;  Location: [facility] OR;  Service: General;  Laterality: Right;   LEFT HEART CATH N/A 12/29/2012   Procedure: LEFT HEART CATH;  Surgeon: Corky Crafts, MD;  Location: [facility] CATH LAB;  Service: Cardiovascular;  Laterality: N/A;   LEFT HEART CATHETERIZATION WITH CORONARY ANGIOGRAM N/A 01/05/2013   Procedure: LEFT HEART CATHETERIZATION WITH CORONARY ANGIOGRAM;  Surgeon: Micheline Chapman, MD;  Location: [facility] CATH LAB;  Service: Cardiovascular;  Laterality: N/A;   LEFT HEART CATHETERIZATION WITH CORONARY ANGIOGRAM N/A 04/22/2013   Procedure: LEFT HEART CATHETERIZATION WITH CORONARY ANGIOGRAM;  Surgeon: Lennette Bihari, MD;  Location: [facility] CATH LAB;  Service: Cardiovascular;  Laterality: N/A;   PARTIAL NEPHRECTOMY Left 1975   PATIENT ONLY HAS ONE KIDNEY   PERCUTANEOUS CORONARY STENT INTERVENTION (PCI-S)  12/29/2012   Procedure: PERCUTANEOUS CORONARY STENT INTERVENTION (PCI-S);  Surgeon: Corky Crafts, MD;  Location: [facility] CATH LAB;  Service: Cardiovascular;;   SUBMUCOSAL TATTOO INJECTION  08/20/2022   Procedure: SUBMUCOSAL TATTOO INJECTION;  Surgeon: Vida Rigger, MD;  Location: [facility] ENDOSCOPY;  Service: Gastroenterology;;   TEE WITHOUT CARDIOVERSION N/A 05/10/2022   Procedure: TRANSESOPHAGEAL ECHOCARDIOGRAM (TEE);  Surgeon: Sande Rives, MD;  Location: [facility] ENDOSCOPY;  Service: Cardiovascular;  Laterality: N/A;    TEE WITHOUT CARDIOVERSION N/A 05/28/2022   Procedure: TRANSESOPHAGEAL ECHOCARDIOGRAM (TEE);  Surgeon: Wendall Stade, MD;  Location: [facility] ENDOSCOPY;  Service: Cardiovascular;  Laterality: N/A;    Social History:   reports that he quit smoking about 23 years ago. His smoking use included cigarettes. He started smoking about 48 years ago. He has a 50 pack-year smoking history. His smokeless tobacco use includes chew. He reports that he does not currently use alcohol. He reports current drug use. Drug: Marijuana.  Allergies  Allergen Reactions   Levitra [Vardenafil] Other (See Comments)    Blurred vision   Aspirin Hives   Lipitor [Atorvastatin] Other (See Comments)    Muscle cramps; patient states he does not take medication at home    Family History  Problem Relation Age of Onset   Alzheimer's disease Mother    CAD Father      Prior to Admission medications   Medication Sig Start Date End Date Taking? Authorizing Provider  acetaminophen (TYLENOL) 500 MG tablet Take 2 tablets (1,000 mg total) by mouth every 6 (six) hours. 08/28/22  Yes Swayze, Ava, DO  amiodarone (PACERONE) 200 MG tablet Take 1/2 tablet (100 mg total) by mouth daily. 08/29/22  Yes Swayze, Ava, DO  buPROPion (WELLBUTRIN XL) 150 MG 24 hr tablet Take 1 tablet (150 mg total) by mouth daily. 05/24/22  Yes Elgergawy, Leana Roe, MD  empagliflozin (JARDIANCE) 10 MG TABS tablet Take 1 tablet (10 mg total) by mouth daily. 06/18/22  Yes Marjie Skiff E, PA-C  ferrous sulfate 325 (65 FE) MG EC tablet Take 1 tablet (325 mg total) by mouth 2 (two) times daily. 07/31/22 09/10/22 Yes Jerald Kief, MD  fluticasone [facility]) 50 MCG/ACT nasal spray Place 1 spray into both nostrils daily as needed for allergies. 12/24/21  Yes [provider]  furosemide (LASIX) 40 MG tablet Take 1 tablet (40 mg total) by mouth 2 (two) times daily. 08/28/22  Yes Swayze, Ava, DO  methocarbamol (ROBAXIN) 500 MG tablet Take 1 tablet (500 mg total) by mouth  every 8 (eight) hours as needed for muscle spasms. 08/28/22  Yes Swayze, Ava, DO  Multiple Vitamin (MULTIVITAMIN WITH MINERALS) TABS tablet Take 1 tablet by mouth daily. 08/29/22  Yes Swayze, Ava, DO  oxyCODONE (OXY IR/ROXICODONE) 5 MG immediate release tablet Take 1 tablet (5 mg total) by mouth every 6 (six) hours as needed for moderate pain. 08/28/22  Yes Swayze, Ava, DO  pantoprazole (PROTONIX) 40 MG tablet Take 1 tablet (40 mg total) by mouth daily. 05/11/22  Yes Pokhrel, Laxman, MD  PARoxetine (PAXIL) 30 MG tablet Take 30 mg by mouth daily.   Yes [provider]  polyethylene glycol powder (GLYCOLAX/MIRALAX) 17 GM/SCOOP powder Take 17g (1 capful) as directed by mouth daily as needed for mild constipation. 08/28/22  Yes Swayze, Ava, DO  potassium chloride SA (KLOR-CON M) 20 MEQ tablet Take 2 tablets (40 mEq total) by mouth daily. 08/28/22  Yes Swayze, Ava, DO  pregabalin (LYRICA) 150 MG capsule Take 1 capsule (150 mg total) by mouth 2 (two) times daily. 05/24/22  Yes Elgergawy, Leana Roe, MD  QUEtiapine (SEROQUEL) 25 MG tablet Take 1 tablet (25 mg total) by mouth at bedtime. 05/10/22  Yes Pokhrel, Rebekah Chesterfield, MD  Rivaroxaban (XARELTO) 15 MG TABS tablet Take 1 tablet (15 mg total) by mouth daily with supper. 08/28/22  Yes Swayze, Ava, DO  rosuvastatin (CRESTOR) 20 MG tablet Take 1 tablet (20 mg total) by mouth daily. 06/18/22  Yes Goodrich, Callie E, PA-C  sacubitril-valsartan (ENTRESTO) 24-26 MG Take 1 tablet by mouth 2 (two) times daily. 06/18/22  Yes Marjie Skiff E, PA-C  spironolactone (ALDACTONE) 25 MG tablet Take 1/2 tablet (12.5 mg total) by mouth daily. 06/18/22  Yes Marjie Skiff E, PA-C  lactose free nutrition (BOOST PLUS) LIQD Take 237 mLs by mouth 2 (two) times daily between meals. Patient not taking: Reported on 09/10/2022 08/29/22   Fran Lowes, DO    Physical Exam: Vitals:   09/10/22 2100 09/10/22 2109 09/10/22 2215 09/11/22 0000  BP: (!) 170/69  111/75 (!) 140/60  Pulse: 62  68 60   Resp: 18   18  Temp:  98.5 F (36.9 C)  98.3 F (36.8 C)  TempSrc:  Oral  Oral  SpO2: 100%  97% 97%  Weight:      Height:         Constitutional: NAD, calm  Eyes: PERTLA, lids and conjunctivae normal ENMT: Mucous membranes are moist. Posterior pharynx clear of any exudate or lesions.   Neck: supple, no masses  Respiratory: no wheezing, no crackles. No accessory muscle use.  Cardiovascular: S1 & S2 heard, regular rate and rhythm. No extremity edema.   Abdomen: No distension, soft, no rebound pain or guarding. Bowel sounds active.  Musculoskeletal: no clubbing / cyanosis. No joint deformity upper and lower extremities.  Skin: no significant rashes, lesions, ulcers. Warm, dry, well-perfused. Neurologic: CN 2-12 grossly intact. Moving all extremities. Alert and oriented.  Psychiatric: Pleasant. Cooperative.    Labs and Imaging on Admission: I have personally reviewed following labs and imaging studies  CBC: Recent Labs  Lab 09/10/22 1714  WBC 21.6*  NEUTROABS 16.2*  HGB 10.7*  HCT 35.4*  MCV 82.7  PLT 535*   Basic Metabolic Panel: Recent Labs  Lab 09/10/22 1714  NA 142  K 3.5  CL 108  CO2 20*  GLUCOSE 120*  BUN 18  CREATININE 1.76*  CALCIUM 9.4   GFR: Estimated Creatinine Clearance: 46.9 mL/min (A) (by C-G formula based on SCr of 1.76 mg/dL (H)). Liver Function Tests: Recent Labs  Lab 09/10/22 1714  AST 25  ALT 15  ALKPHOS 81  BILITOT 0.8  PROT 7.4  ALBUMIN 3.7   Recent Labs  Lab 09/10/22 1714  LIPASE 34   No results for input(s): "AMMONIA" in the last 168 hours. Coagulation Profile: No results for input(s): "INR", "PROTIME" in the last 168 hours. Cardiac Enzymes: No results for input(s): "CKTOTAL", "CKMB", "CKMBINDEX", "TROPONINI" in the last 168 hours. BNP (last 3 results) No results for input(s): "PROBNP" in the last 8760 hours. HbA1C: No results for input(s): "HGBA1C" in the last 72 hours. CBG: No results for input(s): "GLUCAP" in the  last 168 hours. Lipid Profile: No results for input(s): "CHOL", "HDL", "LDLCALC", "TRIG", "CHOLHDL", "LDLDIRECT" in the last 72 hours. Thyroid Function Tests: No results for input(s): "TSH", "T4TOTAL", "FREET4", "T3FREE", "THYROIDAB" in the last 72 hours. Anemia Panel: No results for input(s): "VITAMINB12", "FOLATE", "FERRITIN", "TIBC", "IRON", "RETICCTPCT" in the last 72 hours. Urine analysis:    Component Value Date/Time   COLORURINE AMBER (A) 05/20/2022 2038   APPEARANCEUR CLEAR 05/20/2022 2038   LABSPEC 1.034 (H) 05/20/2022 2038   PHURINE 5.0 05/20/2022 2038   GLUCOSEU NEGATIVE 05/20/2022 2038   HGBUR NEGATIVE 05/20/2022 2038   BILIRUBINUR NEGATIVE 05/20/2022 2038   KETONESUR NEGATIVE 05/20/2022 2038   PROTEINUR NEGATIVE 05/20/2022 2038   NITRITE NEGATIVE 05/20/2022 2038   LEUKOCYTESUR NEGATIVE 05/20/2022 2038   Sepsis Labs: @LABRCNTIP (procalcitonin:4,lacticidven:4) ) Recent Results (from the past 240 hour(s))  Resp panel by RT-PCR (RSV, Flu A&B, Covid) Anterior Nasal Swab     Status: None   Collection Time: 09/10/22  4:48 PM   Specimen: Anterior Nasal Swab  Result Value Ref Range Status   SARS Coronavirus 2 by RT PCR NEGATIVE NEGATIVE Final   Influenza A by PCR NEGATIVE NEGATIVE Final   Influenza B by PCR NEGATIVE NEGATIVE Final    Comment: (NOTE) The Xpert Xpress SARS-CoV-2/FLU/RSV plus assay is intended as an aid in the diagnosis of influenza from Nasopharyngeal swab specimens and should not be used as a sole basis for treatment. Nasal washings and aspirates are unacceptable for Xpert Xpress SARS-CoV-2/FLU/RSV testing.  Fact Sheet for Patients: BloggerCourse.com  Fact Sheet for Healthcare Providers: SeriousBroker.it  This test is not yet approved or cleared by the [place] FDA and has been authorized for detection and/or diagnosis of SARS-CoV-2 by FDA under an Emergency Use Authorization (EUA). This EUA will  remain in effect (meaning this test can be used) for the duration of the COVID-19 declaration under Section 564(b)(1) of the Act, 21 U.S.C. section 360bbb-3(b)(1), unless the authorization is terminated or revoked.     Resp Syncytial Virus by PCR NEGATIVE NEGATIVE Final    Comment: (NOTE) Fact Sheet for Patients: BloggerCourse.com  Fact Sheet for Healthcare Providers:  SeriousBroker.it  This test is not yet approved or cleared by the [place] and has been authorized for detection and/or diagnosis of SARS-CoV-2 by FDA under an Emergency Use Authorization (EUA). This EUA will remain in effect (meaning this test can be used) for the duration of the COVID-19 declaration under Section 564(b)(1) of the Act, 21 U.S.C. section 360bbb-3(b)(1), unless the authorization is terminated or revoked.  Performed at [facility], [address], [place], [place] [address]      Radiological Exams on Admission: CT ABDOMEN PELVIS W CONTRAST  Result Date: 09/10/2022 CLINICAL DATA:  Abdominal pain, postop. EXAM: CT ABDOMEN AND PELVIS WITH CONTRAST TECHNIQUE: Multidetector CT imaging of the abdomen and pelvis was performed using the standard protocol following bolus administration of intravenous contrast. RADIATION DOSE REDUCTION: This exam was performed according to the departmental dose-optimization program which includes automated exposure control, adjustment of the mA and/or kV according to patient size and/or use of iterative reconstruction technique. CONTRAST:  60mL OMNIPAQUE IOHEXOL 350 MG/ML SOLN COMPARISON:  None Available. FINDINGS: Lower chest: No acute abnormality. Hepatobiliary: No focal liver abnormality is seen. Status post cholecystectomy. No biliary dilatation. Pancreas: Unremarkable. No pancreatic ductal dilatation or surrounding inflammatory changes. Spleen: Normal in size without focal abnormality. Adrenals/Urinary Tract:  Adrenal glands are unremarkable. Postsurgical changes for prior left nephrectomy. Right kidney and ureter are unremarkable. Bladder is unremarkable. Stomach/Bowel: Stomach is within normal limits. Small bowel loops are normal in caliber. Appendix not visualized. Postsurgical changes in the ascending colon with mild wall thickening and adjacent fat stranding. No fluid collection. Colonic diverticulosis prominent in the sigmoid colon without evidence of acute diverticulitis. Vascular/Lymphatic: Aortic atherosclerosis. No enlarged abdominal or pelvic lymph nodes. Reproductive: Prostate is unremarkable. Other: No abdominal wall hernia or abnormality. No abdominopelvic ascites. Musculoskeletal: Compression deformity of the L1 vertebral body, a chronic process. No acute osseous abnormality. Multilevel degenerate disc disease of the lumbar spine with facet joint arthropathy. IMPRESSION: 1. Postsurgical changes in the ascending colon with mild wall thickening and adjacent fat stranding. No fluid collection. 2. Colonic diverticulosis without evidence of acute diverticulitis. 3. Postsurgical changes for prior left nephrectomy. Right kidney and ureter are unremarkable. 4. Chronic compression deformity of the L1 vertebral body. Aortic Atherosclerosis (ICD10-I70.0). Electronically Signed   By: Larose Hires D.O.   On: 09/10/2022 19:59   DG Chest 2 View  Result Date: 09/10/2022 CLINICAL DATA:  Cough. EXAM: CHEST - 2 VIEW COMPARISON:  August 17, 2022 FINDINGS: The heart size and mediastinal contours are within normal limits. Both lungs are clear. The visualized skeletal structures are unremarkable. IMPRESSION: No active cardiopulmonary disease. Electronically Signed   By: Ted Mcalpine M.D.   On: 09/10/2022 17:54    EKG: Independently reviewed. Atrial fibrillation.   Assessment/Plan   1. ?infectious colitis   - Presents with subjective fever, N/V, fatigue, and loose stools and is found to have colonic wall thickening  and adjacent fat-stranding  - Increased leukocytosis noted  - No fluid collection on CT; ?post-surgical findings vs infection  - Continue empiric antibiotics for now and symptomatic care, follow-up surgery recommendations    2. CAD  - No anginal symptoms  - Continue Crestor and Xarelto    3. Chronic HFpEF  - He was hypovolemic on presentation and given 2 liters NS in ED  - Hold diuretics, continue Entresto, monitor weight and I/Os    4. Atrial flutter  - Continue amiodarone and Xarelto    5. Depression, anxiety  -  Continue Celexa, Paxil, and Seroquel    6. CKD 3B  - SCr is 1.76 on admission, appears close to baseline  - Renally-dose medications, monitor     DVT prophylaxis: Xarelto  Code Status: Full  Level of Care: Level of care: Telemetry Medical Family Communication: none present  Disposition Plan:  Patient is from: home  Anticipated d/c is to: TBD Anticipated d/c date is: Possibly as early as 7/16 or 09/12/22  Patient currently: Pending surgery consultation, cultures, PT eval Consults called: Surgery  Admission status: Observation     Briscoe Deutscher, MD [facility]  09/11/2022, 12:55 AM

## 2022-09-12 ENCOUNTER — Encounter: Payer: Self-pay | Admitting: *Deleted

## 2022-09-12 ENCOUNTER — Other Ambulatory Visit: Payer: Self-pay | Admitting: *Deleted

## 2022-09-12 DIAGNOSIS — A419 Sepsis, unspecified organism: Secondary | ICD-10-CM

## 2022-09-12 DIAGNOSIS — I4892 Unspecified atrial flutter: Secondary | ICD-10-CM | POA: Diagnosis not present

## 2022-09-12 DIAGNOSIS — N1832 Chronic kidney disease, stage 3b: Secondary | ICD-10-CM | POA: Diagnosis not present

## 2022-09-12 DIAGNOSIS — K529 Noninfective gastroenteritis and colitis, unspecified: Principal | ICD-10-CM

## 2022-09-12 DIAGNOSIS — C182 Malignant neoplasm of ascending colon: Secondary | ICD-10-CM

## 2022-09-12 DIAGNOSIS — R112 Nausea with vomiting, unspecified: Secondary | ICD-10-CM | POA: Diagnosis not present

## 2022-09-12 LAB — CBC
HCT: 27.7 % — ABNORMAL LOW (ref 39.0–52.0)
Hemoglobin: 8.3 g/dL — ABNORMAL LOW (ref 13.0–17.0)
MCH: 25.5 pg — ABNORMAL LOW (ref 26.0–34.0)
MCHC: 30 g/dL (ref 30.0–36.0)
MCV: 85 fL (ref 80.0–100.0)
Platelets: 252 10*3/uL (ref 150–400)
RBC: 3.26 MIL/uL — ABNORMAL LOW (ref 4.22–5.81)
RDW: 19.4 % — ABNORMAL HIGH (ref 11.5–15.5)
WBC: 8.4 10*3/uL (ref 4.0–10.5)
nRBC: 0 % (ref 0.0–0.2)

## 2022-09-12 LAB — BASIC METABOLIC PANEL
Anion gap: 11 (ref 5–15)
BUN: 12 mg/dL (ref 8–23)
CO2: 19 mmol/L — ABNORMAL LOW (ref 22–32)
Calcium: 8.4 mg/dL — ABNORMAL LOW (ref 8.9–10.3)
Chloride: 112 mmol/L — ABNORMAL HIGH (ref 98–111)
Creatinine, Ser: 1.36 mg/dL — ABNORMAL HIGH (ref 0.61–1.24)
GFR, Estimated: 56 mL/min — ABNORMAL LOW (ref >60)
Glucose, Bld: 92 mg/dL (ref 70–99)
Potassium: 3.5 mmol/L (ref 3.5–5.1)
Sodium: 142 mmol/L (ref 135–145)

## 2022-09-12 MED ORDER — RIVAROXABAN 10 MG PO TABS
20.0000 mg | ORAL_TABLET | Freq: Every day | ORAL | Status: DC
  Administered 2022-09-12: 20 mg via ORAL
  Filled 2022-09-12: qty 2

## 2022-09-12 MED ORDER — HYDRALAZINE HCL 20 MG/ML IJ SOLN
10.0000 mg | Freq: Four times a day (QID) | INTRAMUSCULAR | Status: DC | PRN
  Filled 2022-09-12: qty 1

## 2022-09-12 NOTE — Progress Notes (Signed)
Subjective: CC: No abdominal pain. Tolerating liquids without n/v. Doesn't like options or how they smell. Passing flatus. Was having diarrhea (non-bloody, no melena) before admission per report to me (told Dr. Bedelia Person yesterday that he was having BM's every other day of normal size, color, and consistency). No further diarrhea or BM since admission.   Afebrile, no tachycardia or hypotension. WBC wnl. Lactic normalized. Hgb stable. No prn pain meds in the last 24 hours.   He has not followed up with oncology.   Objective: Vital signs in last 24 hours: Temp:  [98 F (36.7 C)-98.5 F (36.9 C)] 98.3 F (36.8 C) (07/17 0443) Pulse Rate:  [47-59] 59 (07/16 0908) Resp:  [16-18] 18 (07/17 0443) BP: (142-172)/(50-63) 160/55 (07/17 0443) SpO2:  [94 %-98 %] 97 % (07/17 0443)    Intake/Output from previous day: 07/16 0701 - 07/17 0700 In: 340 [P.O.:240; IV Piggyback:100] Out: 525 [Urine:525] Intake/Output this shift: No intake/output data recorded.  PE: Gen:  Alert, NAD, pleasant Abd: Soft, ND, NT, +BS. Midline incision cdi and healing well.   Lab Results:  Recent Labs    09/11/22 0327 09/12/22 0241  WBC 11.8* 8.4  HGB 8.3* 8.3*  HCT 27.6* 27.7*  PLT 319 252   BMET Recent Labs    09/11/22 0327 09/12/22 0241  NA 141 142  K 3.3* 3.5  CL 112* 112*  CO2 21* 19*  GLUCOSE 98 92  BUN 17 12  CREATININE 1.39* 1.36*  CALCIUM 8.3* 8.4*   PT/INR No results for input(s): "LABPROT", "INR" in the last 72 hours. CMP     Component Value Date/Time   NA 142 09/12/2022 0241   NA 139 09/03/2022 1100   NA 139 11/07/2016 1155   K 3.5 09/12/2022 0241   K 4.0 11/07/2016 1155   CL 112 (H) 09/12/2022 0241   CO2 19 (L) 09/12/2022 0241   CO2 25 11/07/2016 1155   GLUCOSE 92 09/12/2022 0241   GLUCOSE 103 11/07/2016 1155   BUN 12 09/12/2022 0241   BUN 15 09/03/2022 1100   BUN 12.6 11/07/2016 1155   CREATININE 1.36 (H) 09/12/2022 0241   CREATININE 1.2 11/07/2016 1155   CALCIUM  8.4 (L) 09/12/2022 0241   CALCIUM 9.1 11/07/2016 1155   PROT 7.4 09/10/2022 1714   PROT 7.0 11/07/2016 1155   ALBUMIN 3.7 09/10/2022 1714   ALBUMIN 3.7 11/07/2016 1155   AST 25 09/10/2022 1714   AST 30 11/07/2016 1155   ALT 15 09/10/2022 1714   ALT 49 11/07/2016 1155   ALKPHOS 81 09/10/2022 1714   ALKPHOS 84 11/07/2016 1155   BILITOT 0.8 09/10/2022 1714   BILITOT 0.70 11/07/2016 1155   GFRNONAA 56 (L) 09/12/2022 0241   GFRAA >60 06/20/2019 1955   Lipase     Component Value Date/Time   LIPASE 34 09/10/2022 1714    Studies/Results: CT ABDOMEN PELVIS W CONTRAST  Result Date: 09/10/2022 CLINICAL DATA:  Abdominal pain, postop. EXAM: CT ABDOMEN AND PELVIS WITH CONTRAST TECHNIQUE: Multidetector CT imaging of the abdomen and pelvis was performed using the standard protocol following bolus administration of intravenous contrast. RADIATION DOSE REDUCTION: This exam was performed according to the departmental dose-optimization program which includes automated exposure control, adjustment of the mA and/or kV according to patient size and/or use of iterative reconstruction technique. CONTRAST:  60mL OMNIPAQUE IOHEXOL 350 MG/ML SOLN COMPARISON:  None Available. FINDINGS: Lower chest: No acute abnormality. Hepatobiliary: No focal liver abnormality is seen. Status post cholecystectomy.  No biliary dilatation. Pancreas: Unremarkable. No pancreatic ductal dilatation or surrounding inflammatory changes. Spleen: Normal in size without focal abnormality. Adrenals/Urinary Tract: Adrenal glands are unremarkable. Postsurgical changes for prior left nephrectomy. Right kidney and ureter are unremarkable. Bladder is unremarkable. Stomach/Bowel: Stomach is within normal limits. Small bowel loops are normal in caliber. Appendix not visualized. Postsurgical changes in the ascending colon with mild wall thickening and adjacent fat stranding. No fluid collection. Colonic diverticulosis prominent in the sigmoid colon  without evidence of acute diverticulitis. Vascular/Lymphatic: Aortic atherosclerosis. No enlarged abdominal or pelvic lymph nodes. Reproductive: Prostate is unremarkable. Other: No abdominal wall hernia or abnormality. No abdominopelvic ascites. Musculoskeletal: Compression deformity of the L1 vertebral body, a chronic process. No acute osseous abnormality. Multilevel degenerate disc disease of the lumbar spine with facet joint arthropathy. IMPRESSION: 1. Postsurgical changes in the ascending colon with mild wall thickening and adjacent fat stranding. No fluid collection. 2. Colonic diverticulosis without evidence of acute diverticulitis. 3. Postsurgical changes for prior left nephrectomy. Right kidney and ureter are unremarkable. 4. Chronic compression deformity of the L1 vertebral body. Aortic Atherosclerosis (ICD10-I70.0). Electronically Signed   By: Larose Hires D.O.   On: 09/10/2022 19:59   DG Chest 2 View  Result Date: 09/10/2022 CLINICAL DATA:  Cough. EXAM: CHEST - 2 VIEW COMPARISON:  August 17, 2022 FINDINGS: The heart size and mediastinal contours are within normal limits. Both lungs are clear. The visualized skeletal structures are unremarkable. IMPRESSION: No active cardiopulmonary disease. Electronically Signed   By: Ted Mcalpine M.D.   On: 09/10/2022 17:54    Anti-infectives: Anti-infectives (From admission, onward)    Start     Dose/Rate Route Frequency Ordered Stop   09/11/22 1600  cefTRIAXone (ROCEPHIN) 2 g in sodium chloride 0.9 % 100 mL IVPB        2 g 200 mL/hr over 30 Minutes Intravenous Every 24 hours 09/11/22 0055     09/11/22 1000  metroNIDAZOLE (FLAGYL) IVPB 500 mg        500 mg 100 mL/hr over 60 Minutes Intravenous Every 12 hours 09/11/22 0055     09/10/22 2300  vancomycin (VANCOREADY) IVPB 1750 mg/350 mL        1,750 mg 175 mL/hr over 120 Minutes Intravenous  Once 09/10/22 2257 09/11/22 0433   09/10/22 2100  cefTRIAXone (ROCEPHIN) 1 g in sodium chloride 0.9 % 100 mL  IVPB        1 g 200 mL/hr over 30 Minutes Intravenous  Once 09/10/22 2055 09/10/22 2254   09/10/22 2100  metroNIDAZOLE (FLAGYL) IVPB 500 mg        500 mg 100 mL/hr over 60 Minutes Intravenous  Once 09/10/22 2055 09/11/22 0004      Final Path A. COLON, RIGHT, HEMICOLECTOMY:  - Invasive moderately differentiated adenocarcinoma, 2.5 cm involving  ascending colon  - Carcinoma invades into pericolonic soft tissue  - Resection margins are negative for carcinoma  - Negative for lymphovascular or perineural invasion  - Twenty-one benign lymph nodes, negative for carcinoma (0/21)  - Benign unremarkable appendix  - Separate tubular adenoma without high-grade dysplasia  - See oncology table   Assessment/Plan Hx lap converted to pen R hemicolectomy with ileocolonic anastomosis by Dr. Derrell Lolling on 08/23/22 for Right colon mass - Final path with invasive well to moderately differentiated adenocarcinoma. CEA 6.6. he did have CT chest during last admission. Patient has not followed up with Oncology. Please ensure patient has Oncology f/u.  - Agree with Dr. Marvetta Gibbons note from  7/16.  Does not seem likely that sepsis is post-operative, but may have some colitis. Stool guaiac has been ordered but has not been obtained. Could consider GI panel. Would prefer to avoid colonoscopy given temporal relationship to partial colectomy.  - Clinically improved. Afebrile without tachycardia or hypotension. WBC wnl. Lactic normalized. Cr stable (hx CKD3). Hgb stable. Tolerating liquids without abdominal pain, n/v and continues to pass flatus. NT on exam. Will discuss with MD but anticipate we will sign off. Will ensure he has post surgical f/u. Please see recs above. Abx per primary team. Please call back with any questions or concerns.   FEN - Soft diet.  VTE - SCDs, still on Xarelto ID - Rocephin/Flagyl per primary   CAD CHF A. Fib  CKD 3B Anemia  L1 compression deformity   LOS: 1 day    Jacinto Halim ,  [facility] 09/12/2022, 7:46 AM Please see Amion for pager number during day hours 7:00am-4:30pm

## 2022-09-12 NOTE — Plan of Care (Signed)

## 2022-09-12 NOTE — Progress Notes (Signed)
Oncology Discharge Planning Note  [facility] Address: [address], [place], [place] [address] Hours of Operation:  Lewayne Bunting, Monday - Friday  Clinic Contact Information:  [phone]) [phone]  Oncology Care Team: Medical Oncologist:  Truett Perna  Patient Details: Name:  Brendan Butler, Brendan Butler MRN:   [id number] DOB:   [birth year] Reason for Current Admission: @PPROB @  Discharge Planning Narrative: Notification of admission received by inpatient team for [name].  Discharge follow-up appointments for oncology are current and available on the AVS and MyChart.   Upon discharge from the hospital, hematology/oncology's post discharge plan of care for the outpatient setting is:   September 27, 2022 at 11:20am  [facility] [address] [place], [place] [address]  [phone]    [name] will be called within two business days after discharge to review hematology/oncology's plan of care for full understanding.    Outpatient Oncology Specific Care Only: Oncology appointment transportation needs addressed?:  no Oncology medication management for symptom management addressed?:  no Chemo Alert Card reviewed?:  not applicable Immunotherapy Alert Card reviewed?:  not applicable

## 2022-09-12 NOTE — Progress Notes (Signed)
[facility]                                                                              Brendan Butler, is a 72 y.o. male, DOB - [birth year], YSA:[id number] Admit date - 09/10/2022    Outpatient Primary MD for the patient is [name], MD  LOS - 1  days  Chief Complaint  Patient presents with   Fatigue       Brief summary    Patient is a 72 year old male with CAD, chronic A-fib on Xarelto, depression, anxiety, CKD 3B, chronic HFpEF, adenocarcinoma of the colon with recent right hemicolectomy on 08/26/2022 presented to the hospital with complaints of nausea vomiting as well as fever.  Found to have colitis.    Assessment & Plan    Principal Problem:   Nausea vomiting and diarrhea, ?  Colitis -Recent history of right hemicolectomy with ileocolonic anastomosis on 08/23/2022 for right colon mass -CT abdomen showed evidence of colonic wall thickening as well as fat stranding. -General surgery consulted, appreciate recommendations. -Clinically improving, afebrile, no tachycardia or hypotension, leukocytosis improving.  Lactic acidosis improving -FOBT ordered, patient reported no melena or hematochezia.  Active Problems:   Leukocytosis -Resolved  Right colon mass/adeno CA - Recent history of right hemicolectomy with ileocolonic anastomosis by Dr. Derrell Lolling on 08/23/2022 for right colon mass - CEA 6.6, final path with invasive well to moderately differentiated adeno CA -Discussed with oncology on call, Dr. Shirline Frees, patient has not had a follow-up appointment with oncology since surgery. Dr Shirline Frees will arrange outpatient referral, consultation and follow-up with oncology.  Updated patient's daughter.     CKD stage 3b, GFR 30-44 ml/min (HCC) -Baseline creatinine 1.7-3.0 -Creatinine currently at baseline 1.3     Essential hypertension -  BP stable  Normocytic anemia -H&H stable, hemoglobin has been stable around 8.    Coronary artery disease involving  native coronary artery of native heart without angina pectoris -Currently no acute cardiac symptoms    OSA on CPAP  Chronic atrial flutter (HCC) -Continue amiodarone, Xarelto     Chronic heart failure with preserved ejection fraction (HFpEF) (HCC), hypertension -Currently stable, no volume overload, continue to follow volume status -Continue Entresto.  Lasix currently on hold, will resume at discharge  -Add IV hydralazine as needed with parameters  Estimated body mass index is 29.64 kg/m as calculated from the following:   Height as of this encounter: 5\' 11"  (1.803 m).   Weight as of this encounter: 96.4 kg.  Code Status: full  DVT Prophylaxis:  rivaroxaban (XARELTO) tablet 20 mg Start: 09/12/22 1700 rivaroxaban (XARELTO) tablet 20 mg   Level of Care: Level of care: Telemetry Medical Family Communication: Updated patient's daughter on the phone x 2 Disposition Plan:      Remains inpatient appropriate: Hopefully DC home tomorrow if tolerating diet and no acute issues today   Procedures:    Consultants:   General surgery Oncology  Antimicrobials:   Anti-infectives (From admission, onward)    Start     Dose/Rate Route Frequency Ordered Stop   09/11/22 1600  cefTRIAXone (ROCEPHIN) 2 g in sodium chloride 0.9 % 100  mL IVPB        2 g 200 mL/hr over 30 Minutes Intravenous Every 24 hours 09/11/22 0055     09/11/22 1000  metroNIDAZOLE (FLAGYL) IVPB 500 mg        500 mg 100 mL/hr over 60 Minutes Intravenous Every 12 hours 09/11/22 0055     09/10/22 2300  vancomycin (VANCOREADY) IVPB 1750 mg/350 mL        1,750 mg 175 mL/hr over 120 Minutes Intravenous  Once 09/10/22 2257 09/11/22 0433   09/10/22 2100  cefTRIAXone (ROCEPHIN) 1 g in sodium chloride 0.9 % 100 mL IVPB        1 g 200 mL/hr over 30 Minutes Intravenous  Once 09/10/22 2055 09/10/22 2254   09/10/22 2100  metroNIDAZOLE (FLAGYL) IVPB 500 mg        500 mg 100 mL/hr over 60 Minutes Intravenous  Once 09/10/22 2055  09/11/22 0004          Medications  amiodarone  100 mg Oral Daily   buPROPion  150 mg Oral Daily   pantoprazole  40 mg Oral Daily   PARoxetine  30 mg Oral Daily   QUEtiapine  25 mg Oral QHS   Rivaroxaban  20 mg Oral Q supper   rosuvastatin  20 mg Oral Daily   sacubitril-valsartan  1 tablet Oral BID   sodium chloride flush  3 mL Intravenous Q12H      Subjective:   Brendan Butler was seen and examined today.  No acute complaints this morning, no chest pain, shortness of breath, nausea vomiting or any diarrhea.  Passing flatus.  No acute abdominal pain.  No fevers.  Objective:   Vitals:   09/11/22 2002 09/12/22 0443 09/12/22 0815 09/12/22 1236  BP: (!) 166/55 (!) 160/55 (!) 176/56 (!) 155/54  Pulse:   (!) 50 (!) 52  Resp: 18 18 18 18   Temp: 98.5 F (36.9 C) 98.3 F (36.8 C) 97.8 F (36.6 C) 98.6 F (37 C)  TempSrc:   Oral Oral  SpO2: 94% 97% 100% 97%  Weight:      Height:        Intake/Output Summary (Last 24 hours) at 09/12/2022 1257 Last data filed at 09/12/2022 0827 Gross per 24 hour  Intake 340 ml  Output 725 ml  Net -385 ml     Wt Readings from Last 3 Encounters:  09/10/22 96.4 kg  09/03/22 96.4 kg  08/28/22 92 kg     Exam General: Alert and oriented x 3, NAD Cardiovascular: S1 S2 auscultated,  RRR Respiratory: Clear to auscultation bilaterally, no wheezing, Gastrointestinal: Soft, midline incision, CDI, NT, ND Ext: no pedal edema bilaterally Neuro: no new deficits Psych: Normal affect     Data Reviewed:  I have personally reviewed following labs    CBC Lab Results  Component Value Date   WBC 8.4 09/12/2022   RBC 3.26 (L) 09/12/2022   HGB 8.3 (L) 09/12/2022   HCT 27.7 (L) 09/12/2022   MCV 85.0 09/12/2022   MCH 25.5 (L) 09/12/2022   PLT 252 09/12/2022   MCHC 30.0 09/12/2022   RDW 19.4 (H) 09/12/2022   LYMPHSABS 3.6 09/10/2022   MONOABS 1.6 (H) 09/10/2022   EOSABS 0.0 09/10/2022   BASOSABS 0.1 09/10/2022     Last metabolic  panel Lab Results  Component Value Date   NA 142 09/12/2022   K 3.5 09/12/2022   CL 112 (H) 09/12/2022   CO2 19 (L) 09/12/2022   BUN 12 09/12/2022  CREATININE 1.36 (H) 09/12/2022   GLUCOSE 92 09/12/2022   GFRNONAA 56 (L) 09/12/2022   GFRAA >60 06/20/2019   CALCIUM 8.4 (L) 09/12/2022   PHOS 2.8 08/28/2022   PROT 7.4 09/10/2022   ALBUMIN 3.7 09/10/2022   BILITOT 0.8 09/10/2022   ALKPHOS 81 09/10/2022   AST 25 09/10/2022   ALT 15 09/10/2022   ANIONGAP 11 09/12/2022    CBG (last 3)  Recent Labs    09/11/22 2002  GLUCAP 96      Coagulation Profile: No results for input(s): "INR", "PROTIME" in the last 168 hours.   Radiology Studies: I have personally reviewed the imaging studies  CT ABDOMEN PELVIS W CONTRAST  Result Date: 09/10/2022 CLINICAL DATA:  Abdominal pain, postop. EXAM: CT ABDOMEN AND PELVIS WITH CONTRAST TECHNIQUE: Multidetector CT imaging of the abdomen and pelvis was performed using the standard protocol following bolus administration of intravenous contrast. RADIATION DOSE REDUCTION: This exam was performed according to the departmental dose-optimization program which includes automated exposure control, adjustment of the mA and/or kV according to patient size and/or use of iterative reconstruction technique. CONTRAST:  60mL OMNIPAQUE IOHEXOL 350 MG/ML SOLN COMPARISON:  None Available. FINDINGS: Lower chest: No acute abnormality. Hepatobiliary: No focal liver abnormality is seen. Status post cholecystectomy. No biliary dilatation. Pancreas: Unremarkable. No pancreatic ductal dilatation or surrounding inflammatory changes. Spleen: Normal in size without focal abnormality. Adrenals/Urinary Tract: Adrenal glands are unremarkable. Postsurgical changes for prior left nephrectomy. Right kidney and ureter are unremarkable. Bladder is unremarkable. Stomach/Bowel: Stomach is within normal limits. Small bowel loops are normal in caliber. Appendix not visualized. Postsurgical  changes in the ascending colon with mild wall thickening and adjacent fat stranding. No fluid collection. Colonic diverticulosis prominent in the sigmoid colon without evidence of acute diverticulitis. Vascular/Lymphatic: Aortic atherosclerosis. No enlarged abdominal or pelvic lymph nodes. Reproductive: Prostate is unremarkable. Other: No abdominal wall hernia or abnormality. No abdominopelvic ascites. Musculoskeletal: Compression deformity of the L1 vertebral body, a chronic process. No acute osseous abnormality. Multilevel degenerate disc disease of the lumbar spine with facet joint arthropathy. IMPRESSION: 1. Postsurgical changes in the ascending colon with mild wall thickening and adjacent fat stranding. No fluid collection. 2. Colonic diverticulosis without evidence of acute diverticulitis. 3. Postsurgical changes for prior left nephrectomy. Right kidney and ureter are unremarkable. 4. Chronic compression deformity of the L1 vertebral body. Aortic Atherosclerosis (ICD10-I70.0). Electronically Signed   By: Larose Hires D.O.   On: 09/10/2022 19:59   DG Chest 2 View  Result Date: 09/10/2022 CLINICAL DATA:  Cough. EXAM: CHEST - 2 VIEW COMPARISON:  August 17, 2022 FINDINGS: The heart size and mediastinal contours are within normal limits. Both lungs are clear. The visualized skeletal structures are unremarkable. IMPRESSION: No active cardiopulmonary disease. Electronically Signed   By: Ted Mcalpine M.D.   On: 09/10/2022 17:54       Beverly Suriano M.D. [facility] 09/12/2022, 12:57 PM  Available via Epic secure chat 7am-7pm After 7 pm, please refer to night coverage provider listed on amion.

## 2022-09-13 ENCOUNTER — Other Ambulatory Visit (HOSPITAL_COMMUNITY): Payer: Self-pay

## 2022-09-13 DIAGNOSIS — I484 Atypical atrial flutter: Secondary | ICD-10-CM

## 2022-09-13 DIAGNOSIS — R112 Nausea with vomiting, unspecified: Secondary | ICD-10-CM | POA: Diagnosis not present

## 2022-09-13 DIAGNOSIS — N1832 Chronic kidney disease, stage 3b: Secondary | ICD-10-CM | POA: Diagnosis not present

## 2022-09-13 DIAGNOSIS — A419 Sepsis, unspecified organism: Secondary | ICD-10-CM | POA: Diagnosis not present

## 2022-09-13 LAB — BASIC METABOLIC PANEL
Anion gap: 9 (ref 5–15)
BUN: 9 mg/dL (ref 8–23)
CO2: 21 mmol/L — ABNORMAL LOW (ref 22–32)
Calcium: 8.6 mg/dL — ABNORMAL LOW (ref 8.9–10.3)
Chloride: 110 mmol/L (ref 98–111)
Creatinine, Ser: 1.31 mg/dL — ABNORMAL HIGH (ref 0.61–1.24)
GFR, Estimated: 59 mL/min — ABNORMAL LOW (ref >60)
Glucose, Bld: 88 mg/dL (ref 70–99)
Potassium: 3.5 mmol/L (ref 3.5–5.1)
Sodium: 140 mmol/L (ref 135–145)

## 2022-09-13 LAB — CBC
HCT: 36.4 % — ABNORMAL LOW (ref 39.0–52.0)
Hemoglobin: 11.2 g/dL — ABNORMAL LOW (ref 13.0–17.0)
MCH: 26.2 pg (ref 26.0–34.0)
MCHC: 30.8 g/dL (ref 30.0–36.0)
MCV: 85.2 fL (ref 80.0–100.0)
Platelets: 294 10*3/uL (ref 150–400)
RBC: 4.27 MIL/uL (ref 4.22–5.81)
RDW: 18.5 % — ABNORMAL HIGH (ref 11.5–15.5)
WBC: 10.9 10*3/uL — ABNORMAL HIGH (ref 4.0–10.5)
nRBC: 0 % (ref 0.0–0.2)

## 2022-09-13 MED ORDER — AMOXICILLIN-POT CLAVULANATE 875-125 MG PO TABS
1.0000 | ORAL_TABLET | Freq: Two times a day (BID) | ORAL | 0 refills | Status: AC
  Filled 2022-09-13: qty 14, 7d supply, fill #0

## 2022-09-13 MED ORDER — AMOXICILLIN-POT CLAVULANATE 875-125 MG PO TABS
1.0000 | ORAL_TABLET | Freq: Two times a day (BID) | ORAL | Status: DC
  Administered 2022-09-13: 1 via ORAL
  Filled 2022-09-13: qty 1

## 2022-09-13 MED ORDER — ONDANSETRON HCL 4 MG PO TABS
4.0000 mg | ORAL_TABLET | Freq: Three times a day (TID) | ORAL | 1 refills | Status: DC | PRN
  Filled 2022-09-13: qty 30, 10d supply, fill #0

## 2022-09-13 NOTE — Discharge Summary (Signed)
Physician Discharge Summary   Patient: Brendan Butler MRN: [id number] DOB: Apr 20, 1950  Admit date:     09/10/2022  Discharge date: 09/13/22  Discharge Physician: Thad Ranger, MD    PCP: [name], MD   Recommendations at discharge:   Continue Augmentin 875-125 mg twice daily for 7 days Oncology referral sent and follow-up scheduled with Dr. Alcide Evener on 09/27/2022  Discharge Diagnoses:    Nausea vomiting and diarrhea   Colitis   Leukocytosis   CKD stage 3b, GFR 30-44 ml/min (HCC)   Essential hypertension   Coronary artery disease involving native coronary artery of native heart without angina pectoris   OSA on CPAP   Atrial flutter (HCC)   Depression with anxiety   Chronic heart failure with preserved ejection fraction (HFpEF) [facility])    Hospital Course:  Patient is a 72 year old male with CAD, chronic A-fib on Xarelto, depression, anxiety, CKD 3B, chronic HFpEF, adenocarcinoma of the colon with recent right hemicolectomy on 08/26/2022 presented to the hospital with complaints of nausea vomiting as well as fever.  Found to have colitis.   Assessment and Plan:   Nausea vomiting and diarrhea, ?  Colitis - Recent history of right hemicolectomy with ileocolonic anastomosis on 08/23/2022 for right colon mass - CT abdomen showed evidence of colonic wall thickening as well as fat stranding. - General surgery was consulted, recommended antibiotics - Clinically improving, afebrile, no tachycardia or hypotension, leukocytosis improving.  Lactic acidosis improving - patient reported no melena or hematochezia. - tolerating diet, cleared  for discharge     Leukocytosis -Resolved   Right colon mass/adeno CA - Recent history of right hemicolectomy with ileocolonic anastomosis by Dr. Derrell Lolling on 08/23/2022 for right colon mass - CEA 6.6, final path with invasive well to moderately differentiated adeno CA -Discussed with oncology on call, Dr. Shirline Frees, outpatient follow-up with oncology  arranged on 09/27/22 with Dr Myrle Sheng. Updated patient's daughter.       CKD stage 3b, GFR 30-44 ml/min (HCC) -Baseline creatinine 1.7-3.0 -Creatinine currently at baseline 1.3       Essential hypertension -  BP stable   Normocytic anemia -H&H stable, Hb 11.2     Coronary artery disease involving native coronary artery of native heart without angina pectoris -Currently no acute cardiac symptoms     OSA on CPAP   Chronic atrial flutter (HCC) -Continue amiodarone, Xarelto       Chronic heart failure with preserved ejection fraction (HFpEF) (HCC), hypertension -Currently stable, no volume overload, continue to follow volume status -Continue Entresto, Lasix   Estimated body mass index is 29.64 kg/m as calculated from the following:   Height as of this encounter: 5\' 11"  (1.803 m).   Weight as of this encounter: 96.4 kg.       Pain control - [employer] Controlled Substance Reporting System database was reviewed. and patient was instructed, not to drive, operate heavy machinery, perform activities at heights, swimming or participation in water activities or provide baby-sitting services while on Pain, Sleep and Anxiety Medications; until their outpatient Physician has advised to do so again. Also recommended to not to take more than prescribed Pain, Sleep and Anxiety Medications.  Consultants: surgery  Procedures performed:   Disposition: Home Diet recommendation:  Discharge Diet Orders (From admission, onward)     Start     Ordered   09/13/22 0000  Diet - low sodium heart healthy        09/13/22 2595  DISCHARGE MEDICATION: Allergies as of 09/13/2022       Reactions   Levitra [vardenafil] Other (See Comments)   Blurred vision   Aspirin Hives   Lipitor [atorvastatin] Other (See Comments)   Muscle cramps; patient states he does not take medication at home        Medication List     TAKE these medications    Acetaminophen Extra Strength 500 MG  Tabs Take 2 tablets (1,000 mg total) by mouth every 6 (six) hours.   amiodarone 200 MG tablet Commonly known as: PACERONE Take 1/2 tablet (100 mg total) by mouth daily.   amoxicillin-clavulanate 875-125 MG tablet Commonly known as: AUGMENTIN Take 1 tablet by mouth 2 (two) times daily for 7 days.   buPROPion 150 MG 24 hr tablet Commonly known as: WELLBUTRIN XL Take 1 tablet (150 mg total) by mouth daily.   CertaVite/Antioxidants Tabs Take 1 tablet by mouth daily.   empagliflozin 10 MG Tabs tablet Commonly known as: JARDIANCE Take 1 tablet (10 mg total) by mouth daily.   ferrous sulfate 325 (65 FE) MG EC tablet Take 1 tablet (325 mg total) by mouth 2 (two) times daily.   fluticasone 50 MCG/ACT nasal spray Commonly known as: FLONASE Place 1 spray into both nostrils daily as needed for allergies.   furosemide 40 MG tablet Commonly known as: LASIX Take 1 tablet (40 mg total) by mouth 2 (two) times daily.   lactose free nutrition Liqd Take 237 mLs by mouth 2 (two) times daily between meals.   methocarbamol 500 MG tablet Commonly known as: ROBAXIN Take 1 tablet (500 mg total) by mouth every 8 (eight) hours as needed for muscle spasms.   ondansetron 4 MG tablet Commonly known as: Zofran Take 1 tablet (4 mg total) by mouth every 8 (eight) hours as needed for nausea or vomiting.   oxyCODONE 5 MG immediate release tablet Commonly known as: Oxy IR/ROXICODONE Take 1 tablet (5 mg total) by mouth every 6 (six) hours as needed for moderate pain.   pantoprazole 40 MG tablet Commonly known as: PROTONIX Take 1 tablet (40 mg total) by mouth daily.   PARoxetine 30 MG tablet Commonly known as: PAXIL Take 30 mg by mouth daily.   polyethylene glycol powder 17 GM/SCOOP powder Commonly known as: GLYCOLAX/MIRALAX Take 17g (1 capful) as directed by mouth daily as needed for mild constipation.   potassium chloride SA 20 MEQ tablet Commonly known as: KLOR-CON M Take 2 tablets (40 mEq  total) by mouth daily.   pregabalin 150 MG capsule Commonly known as: LYRICA Take 1 capsule (150 mg total) by mouth 2 (two) times daily.   QUEtiapine 25 MG tablet Commonly known as: SEROQUEL Take 1 tablet (25 mg total) by mouth at bedtime.   rosuvastatin 20 MG tablet Commonly known as: CRESTOR Take 1 tablet (20 mg total) by mouth daily.   sacubitril-valsartan 24-26 MG Commonly known as: ENTRESTO Take 1 tablet by mouth 2 (two) times daily.   spironolactone 25 MG tablet Commonly known as: ALDACTONE Take 1/2 tablet (12.5 mg total) by mouth daily.   Xarelto 15 MG Tabs tablet Generic drug: Rivaroxaban Take 1 tablet (15 mg total) by mouth daily with supper.        Follow-up Information     Axel Filler, MD Follow up on 09/24/2022.   Specialty: General Surgery Why: 9am. Bring a copy of your photo ID, insurance card and arrive 30 minutes prior to your appointment Contact information: [address]  [address] [place] [address] [phone]         Ladene Artist, MD Follow up on 09/27/2022.   Specialty: Oncology Why: at 11:20 AM, for hospital follow-up Contact information: [address] [place] [address] [phone]         [name], MD. Schedule an appointment as soon as possible for a visit in 2 week(s).   Specialty: Internal Medicine Why: for hospital follow-up Contact information: [address] [place] [address] [phone]                Discharge Exam: Ceasar Mons Weights   09/10/22 2017  Weight: 96.4 kg   S: Feeling better, tolerating diet. No fevers, no nausea, no vomiting.     BP (!) 160/80   Pulse (!) 50   Temp 97.7 F (36.5 C)   Resp 18   Ht 5\' 11"  (1.803 m)   Wt 96.4 kg   SpO2 100%   BMI 29.64 kg/m    Physical Exam General: Alert and oriented x 3, NAD Cardiovascular: S1 S2 clear, RRR.  Respiratory: CTAB, no wheezing Gastrointestinal: Soft, midline incision, NT, nondistended,  NBS Ext: no pedal edema bilaterally Neuro: no new deficits Psych: Normal affect   Condition at discharge: fair  The results of significant diagnostics from this hospitalization (including imaging, microbiology, ancillary and laboratory) are listed below for reference.   Imaging Studies: CT ABDOMEN PELVIS W CONTRAST  Result Date: 09/10/2022 CLINICAL DATA:  Abdominal pain, postop. EXAM: CT ABDOMEN AND PELVIS WITH CONTRAST TECHNIQUE: Multidetector CT imaging of the abdomen and pelvis was performed using the standard protocol following bolus administration of intravenous contrast. RADIATION DOSE REDUCTION: This exam was performed according to the departmental dose-optimization program which includes automated exposure control, adjustment of the mA and/or kV according to patient size and/or use of iterative reconstruction technique. CONTRAST:  60mL OMNIPAQUE IOHEXOL 350 MG/ML SOLN COMPARISON:  None Available. FINDINGS: Lower chest: No acute abnormality. Hepatobiliary: No focal liver abnormality is seen. Status post cholecystectomy. No biliary dilatation. Pancreas: Unremarkable. No pancreatic ductal dilatation or surrounding inflammatory changes. Spleen: Normal in size without focal abnormality. Adrenals/Urinary Tract: Adrenal glands are unremarkable. Postsurgical changes for prior left nephrectomy. Right kidney and ureter are unremarkable. Bladder is unremarkable. Stomach/Bowel: Stomach is within normal limits. Small bowel loops are normal in caliber. Appendix not visualized. Postsurgical changes in the ascending colon with mild wall thickening and adjacent fat stranding. No fluid collection. Colonic diverticulosis prominent in the sigmoid colon without evidence of acute diverticulitis. Vascular/Lymphatic: Aortic atherosclerosis. No enlarged abdominal or pelvic lymph nodes. Reproductive: Prostate is unremarkable. Other: No abdominal wall hernia or abnormality. No abdominopelvic ascites. Musculoskeletal:  Compression deformity of the L1 vertebral body, a chronic process. No acute osseous abnormality. Multilevel degenerate disc disease of the lumbar spine with facet joint arthropathy. IMPRESSION: 1. Postsurgical changes in the ascending colon with mild wall thickening and adjacent fat stranding. No fluid collection. 2. Colonic diverticulosis without evidence of acute diverticulitis. 3. Postsurgical changes for prior left nephrectomy. Right kidney and ureter are unremarkable. 4. Chronic compression deformity of the L1 vertebral body. Aortic Atherosclerosis (ICD10-I70.0). Electronically Signed   By: Larose Hires D.O.   On: 09/10/2022 19:59   DG Chest 2 View  Result Date: 09/10/2022 CLINICAL DATA:  Cough. EXAM: CHEST - 2 VIEW COMPARISON:  August 17, 2022 FINDINGS: The heart size and mediastinal contours are within normal limits. Both lungs are clear. The visualized skeletal structures are unremarkable. IMPRESSION: No active  cardiopulmonary disease. Electronically Signed   By: Ted Mcalpine M.D.   On: 09/10/2022 17:54   CT CHEST ABDOMEN PELVIS WO CONTRAST  Result Date: 08/22/2022 CLINICAL DATA:  Colon cancer staging.  * Tracking Code: BO * EXAM: CT CHEST, ABDOMEN AND PELVIS WITHOUT CONTRAST TECHNIQUE: Multidetector CT imaging of the chest, abdomen and pelvis was performed following the standard protocol without IV contrast. RADIATION DOSE REDUCTION: This exam was performed according to the departmental dose-optimization program which includes automated exposure control, adjustment of the mA and/or kV according to patient size and/or use of iterative reconstruction technique. COMPARISON:  CT 05/20/2022 chest abdomen pelvis. FINDINGS: CT CHEST FINDINGS Cardiovascular: Heart is nonenlarged. No pericardial effusion. Coronary artery calcifications are seen. The thoracic aorta on this non IV contrast exam has a normal course and caliber with scattered vascular calcifications. Mediastinum/Nodes: No specific abnormal  lymph node enlargement identified in the axillary regions, hilum or mediastinum. A few small less than 1 cm in size in short axis lymph nodes are seen, nonpathologic by size criteria. Slightly patulous thoracic esophagus with small hiatal hernia. Preserved thyroid gland. Lungs/Pleura: Bilateral small upper lobe air cysts identified. There are some dependent atelectasis with some scarring and subtle ground-glass. There is metallic marker in the right lower lobe on series 4, image 106. Minimal areas of ground-glass in the upper lobes as well. No pneumothorax, effusion or consolidation. The tiny right effusion is no longer identified. No new discrete dominant lung nodule. Musculoskeletal: Some interval healing of the previously seen posterior right lower rib fractures. Pins along the left shoulder. Degenerative changes of the spine. There is moderate compression deformity of L1 along the superior endplate with some kyphosis and sclerosis. This is unchanged from the study of March 2024. CT ABDOMEN PELVIS FINDINGS Hepatobiliary: Evaluation for solid organ pathology including metastatic disease is limited without the advantage of IV contrast. Liver is grossly preserved. Previous cholecystectomy. Pancreas: Unremarkable. No pancreatic ductal dilatation or surrounding inflammatory changes. Spleen: Normal in size without focal abnormality. Adrenals/Urinary Tract: Adrenal glands are preserved. Parapelvic lower pole right-sided renal cysts. No right renal or ureteral stone. No collecting system dilatation on the right. Left kidney is surgically absent with surgical clips in the nephrectomy bed. Bladder is underdistended. Stomach/Bowel: Oral contrast was administered. Normal appendix in the right lower quadrant. Large bowel has a normal course and caliber with scattered stool and diverticula. There is some debris in the stomach. Small bowel is nondilated. Vascular/Lymphatic: Aortic atherosclerosis. No enlarged abdominal or  pelvic lymph nodes. Reproductive: Prostate is unremarkable. Other: No free air or free fluid. Musculoskeletal: Scattered degenerative changes of the spine and pelvis. There are some disc bulging and stenosis identified along the lower lumbar spine at L4-5. IMPRESSION: Surgical changes of prior left nephrectomy. No recurrent mass lesion, fluid collection or lymph node enlargement in the chest, abdomen or pelvis. Improving right tiny pleural effusion. Colonic diverticula.  No bowel obstruction. Stable compression of L1.  Healing right-sided rib fractures. Evaluation for malignancy and solid organ pathology is limited without the advantage of IV contrast Electronically Signed   By: Karen Kays M.D.   On: 08/22/2022 16:04   ECHOCARDIOGRAM LIMITED  Result Date: 08/18/2022    ECHOCARDIOGRAM REPORT   Patient Name:   PRANAY HILBUN Date of Exam: 08/17/2022 Medical Rec #:  [id number]        Height:       70.0 in Accession #:    [id number]       Weight:  216.5 lb Date of Birth:  12-19-1950       BSA:          2.159 m Patient Age:    89 years         BP:           147/53 mmHg Patient Gender: M                HR:           46 bpm. Exam Location:  Inpatient Procedure: Limited Echo, Color Doppler and Cardiac Doppler Indications:    dyspnea  History:        Patient has prior history of Echocardiogram examinations. CHF,                 CAD, chronic kidney disease; Risk Factors:Hypertension,                 Dyslipidemia and Sleep Apnea.  Sonographer:    Delcie Roch RDCS Referring Phys: [id number] Cecille Po MELVIN IMPRESSIONS  1. Left ventricular ejection fraction, by estimation, is 50%. The left ventricle has low normal function. The left ventricle demonstrates regional wall motion abnormalities (see scoring diagram/findings for description). Left ventricular diastolic parameters were grossly normal.  2. Right ventricular systolic function is normal. The right ventricular size is normal. There is normal pulmonary  artery systolic pressure. The estimated right ventricular systolic pressure is 20.8 mmHg.  3. The mitral valve is grossly normal. Trivial mitral valve regurgitation.  4. The aortic valve is grossly normal. Aortic valve regurgitation is trivial. No aortic stenosis is present.  5. The inferior vena cava is normal in size with greater than 50% respiratory variability, suggesting right atrial pressure of 3 mmHg. FINDINGS  Left Ventricle: Left ventricular ejection fraction, by estimation, is 50%. The left ventricle has low normal function. The left ventricle demonstrates regional wall motion abnormalities. The left ventricular internal cavity size was normal in size. There is no left ventricular hypertrophy. Left ventricular diastolic parameters were normal.  LV Wall Scoring: The mid anteroseptal segment, mid inferoseptal segment, and basal inferoseptal segment are hypokinetic. Right Ventricle: The right ventricular size is normal. No increase in right ventricular wall thickness. Right ventricular systolic function is normal. There is normal pulmonary artery systolic pressure. The tricuspid regurgitant velocity is 2.11 m/s, and  with an assumed right atrial pressure of 3 mmHg, the estimated right ventricular systolic pressure is 20.8 mmHg. Left Atrium: Left atrial size was normal in size. Right Atrium: Right atrial size was normal in size. Pericardium: There is no evidence of pericardial effusion. Mitral Valve: The mitral valve is grossly normal. Trivial mitral valve regurgitation. Tricuspid Valve: The tricuspid valve is grossly normal. Tricuspid valve regurgitation is trivial. Aortic Valve: The aortic valve is grossly normal. Aortic valve regurgitation is trivial. No aortic stenosis is present. Pulmonic Valve: The pulmonic valve was grossly normal. Pulmonic valve regurgitation is trivial. Aorta: The aortic root is normal in size and structure. Venous: The inferior vena cava is normal in size with greater than 50%  respiratory variability, suggesting right atrial pressure of 3 mmHg. IAS/Shunts: The interatrial septum was not assessed.  LEFT VENTRICLE PLAX 2D LVIDd:         6.10 cm      Diastology LVIDs:         4.30 cm      LV e' medial:    7.18 cm/s LV PW:         1.00 cm  LV E/e' medial:  12.3 LV IVS:        0.90 cm      LV e' lateral:   12.50 cm/s LVOT diam:     2.20 cm      LV E/e' lateral: 7.0 LV SV:         97 LV SV Index:   45 LVOT Area:     3.80 cm  LV Volumes (MOD) LV vol d, MOD A4C: 118.0 ml LV vol s, MOD A4C: 66.5 ml LV SV MOD A4C:     118.0 ml IVC IVC diam: 1.20 cm LEFT ATRIUM         Index LA diam:    4.60 cm 2.13 cm/m  AORTIC VALVE LVOT Vmax:   104.00 cm/s LVOT Vmean:  66.600 cm/s LVOT VTI:    0.254 m  AORTA Ao Asc diam: 3.60 cm MITRAL VALVE               TRICUSPID VALVE MV Area (PHT): 3.20 cm    TR Peak grad:   17.8 mmHg MV Decel Time: 237 msec    TR Vmax:        211.00 cm/s MV E velocity: 88.00 cm/s MV A velocity: 88.00 cm/s  SHUNTS MV E/A ratio:  1.00        Systemic VTI:  0.25 m                            Systemic Diam: 2.20 cm Weston Brass MD Electronically signed by Weston Brass MD Signature Date/Time: 08/18/2022/6:33:02 AM    Final    VAS [place] ABI WITH/WO TBI  Result Date: 08/17/2022  LOWER EXTREMITY DOPPLER STUDY Patient Name:  JAQUELL SEDDON  Date of Exam:   08/17/2022 Medical Rec #: [id number]         Accession #:    [id number] Date of Birth: [birth year]        Patient Gender: M Patient Age:   86 years Exam Location:  [facility] Procedure:      VAS [place] ABI WITH/WO TBI Referring Phys: Duwayne Heck RAY --------------------------------------------------------------------------------  Indications: Rest pain. High Risk Factors: Hypertension, hyperlipidemia, coronary artery disease.  Comparison Study: No previous study. Performing Technologist: McKayla Maag RVT, [place]  Examination Guidelines: A complete evaluation includes at minimum, Doppler waveform signals and systolic blood pressure reading  at the level of bilateral brachial, anterior tibial, and posterior tibial arteries, when vessel segments are accessible. Bilateral testing is considered an integral part of a complete examination. Photoelectric Plethysmograph (PPG) waveforms and toe systolic pressure readings are included as required and additional duplex testing as needed. Limited examinations for reoccurring indications may be performed as noted.  ABI Findings: +---------+------------------+-----+-----------+-------------------------------+ Right    Rt Pressure (mmHg)IndexWaveform   Comment                         +---------+------------------+-----+-----------+-------------------------------+ Brachial                        triphasic  No pressure obtained due to IV                                             placement                       +---------+------------------+-----+-----------+-------------------------------+  PTA      146               1.24 multiphasic                                +---------+------------------+-----+-----------+-------------------------------+ DP       143               1.21 multiphasic                                +---------+------------------+-----+-----------+-------------------------------+ Great Toe99                0.84 Normal                                     +---------+------------------+-----+-----------+-------------------------------+ +---------+------------------+-----+-----------+-------+ Left     Lt Pressure (mmHg)IndexWaveform   Comment +---------+------------------+-----+-----------+-------+ Brachial 118                    triphasic          +---------+------------------+-----+-----------+-------+ PTA      121               1.03 multiphasic        +---------+------------------+-----+-----------+-------+ DP       121               1.03 multiphasic        +---------+------------------+-----+-----------+-------+ Great Toe99                 0.84 Normal             +---------+------------------+-----+-----------+-------+ +-------+-----------+-----------+------------+------------+ ABI/TBIToday's ABIToday's TBIPrevious ABIPrevious TBI +-------+-----------+-----------+------------+------------+ Right  1.24       0.84                                +-------+-----------+-----------+------------+------------+ Left   1.03       0.84                                +-------+-----------+-----------+------------+------------+  Summary: Right: Resting right ankle-brachial index is within normal range. The right toe-brachial index is normal. Left: Resting left ankle-brachial index is within normal range. The left toe-brachial index is normal. *See table(s) above for measurements and observations.  Electronically signed by Waverly Ferrari MD on 08/17/2022 at 5:06:18 PM.    Final    VAS [place] LOWER EXTREMITY VENOUS (DVT) (7a-7p)  Result Date: 08/17/2022  Lower Venous DVT Study Patient Name:  TEDFORD BERG  Date of Exam:   08/17/2022 Medical Rec #: [id number]         Accession #:    [id number] Date of Birth: Apr 24, 1950        Patient Gender: M Patient Age:   72 years Exam Location:  [facility] Procedure:      VAS [place] LOWER EXTREMITY VENOUS (DVT) Referring Phys: Duwayne Heck RAY --------------------------------------------------------------------------------  Indications: Swelling.  Comparison Study: Previous study 07/30/22 negative. Performing Technologist: McKayla Maag RVT, [place]  Examination Guidelines: A complete evaluation includes B-mode imaging, spectral Doppler, color Doppler, and power Doppler as needed of all accessible portions of each vessel. Bilateral testing is considered an integral part of a complete examination.  Limited examinations for reoccurring indications may be performed as noted. The reflux portion of the exam is performed with the patient in reverse Trendelenburg.   +---------+---------------+---------+-----------+----------+--------------+ RIGHT    CompressibilityPhasicitySpontaneityPropertiesThrombus Aging +---------+---------------+---------+-----------+----------+--------------+ CFV      Full           Yes      Yes                                 +---------+---------------+---------+-----------+----------+--------------+ SFJ      Full                                                        +---------+---------------+---------+-----------+----------+--------------+ FV Prox  Full                                                        +---------+---------------+---------+-----------+----------+--------------+ FV Mid   Full                                                        +---------+---------------+---------+-----------+----------+--------------+ FV DistalFull                                                        +---------+---------------+---------+-----------+----------+--------------+ PFV      Full                                                        +---------+---------------+---------+-----------+----------+--------------+ POP      Full           Yes      Yes                                 +---------+---------------+---------+-----------+----------+--------------+ PTV      Full                                                        +---------+---------------+---------+-----------+----------+--------------+ PERO     Full                                                        +---------+---------------+---------+-----------+----------+--------------+   +----+---------------+---------+-----------+----------+--------------+ LEFTCompressibilityPhasicitySpontaneityPropertiesThrombus Aging +----+---------------+---------+-----------+----------+--------------+ CFV Full           Yes  Yes                                  +----+---------------+---------+-----------+----------+--------------+ SFJ Full                                                        +----+---------------+---------+-----------+----------+--------------+     Summary: RIGHT: - There is no evidence of deep vein thrombosis in the lower extremity.  - No cystic structure found in the popliteal fossa.  LEFT: - No evidence of common femoral vein obstruction.  *See table(s) above for measurements and observations. Electronically signed by Waverly Ferrari MD on 08/17/2022 at 5:06:09 PM.    Final    DG Chest 2 View  Result Date: 08/17/2022 CLINICAL DATA:  Shortness of breath. EXAM: CHEST - 2 VIEW COMPARISON:  05/23/2022 FINDINGS: Mild cardiac enlargement. There is no evidence of pulmonary edema, consolidation, pneumothorax, nodule or pleural fluid. Visualized bony structures are unremarkable. IMPRESSION: Mild cardiac enlargement. Electronically Signed   By: Irish Lack M.D.   On: 08/17/2022 10:00    Microbiology: Results for orders placed or performed during the hospital encounter of 09/10/22  Resp panel by RT-PCR (RSV, Flu A&B, Covid) Anterior Nasal Swab     Status: None   Collection Time: 09/10/22  4:48 PM   Specimen: Anterior Nasal Swab  Result Value Ref Range Status   SARS Coronavirus 2 by RT PCR NEGATIVE NEGATIVE Final   Influenza A by PCR NEGATIVE NEGATIVE Final   Influenza B by PCR NEGATIVE NEGATIVE Final    Comment: (NOTE) The Xpert Xpress SARS-CoV-2/FLU/RSV plus assay is intended as an aid in the diagnosis of influenza from Nasopharyngeal swab specimens and should not be used as a sole basis for treatment. Nasal washings and aspirates are unacceptable for Xpert Xpress SARS-CoV-2/FLU/RSV testing.  Fact Sheet for Patients: BloggerCourse.com  Fact Sheet for Healthcare Providers: SeriousBroker.it  This test is not yet approved or cleared by the [place] FDA and has been  authorized for detection and/or diagnosis of SARS-CoV-2 by FDA under an Emergency Use Authorization (EUA). This EUA will remain in effect (meaning this test can be used) for the duration of the COVID-19 declaration under Section 564(b)(1) of the Act, 21 U.S.C. section 360bbb-3(b)(1), unless the authorization is terminated or revoked.     Resp Syncytial Virus by PCR NEGATIVE NEGATIVE Final    Comment: (NOTE) Fact Sheet for Patients: BloggerCourse.com  Fact Sheet for Healthcare Providers: SeriousBroker.it  This test is not yet approved or cleared by the [place] FDA and has been authorized for detection and/or diagnosis of SARS-CoV-2 by FDA under an Emergency Use Authorization (EUA). This EUA will remain in effect (meaning this test can be used) for the duration of the COVID-19 declaration under Section 564(b)(1) of the Act, 21 U.S.C. section 360bbb-3(b)(1), unless the authorization is terminated or revoked.  Performed at [facility], [address] [place], [place] [address]   Blood culture (routine x 2)     Status: None (Preliminary result)   Collection Time: 09/10/22  6:43 PM   Specimen: BLOOD LEFT ARM  Result Value Ref Range Status   Specimen Description BLOOD LEFT ARM  Final   Special Requests   Final    BOTTLES DRAWN AEROBIC  AND ANAEROBIC Blood Culture results may not be optimal due to an inadequate volume of blood received in culture bottles   Culture   Final    NO GROWTH 3 DAYS Performed at [facility], [address] [place], [place] [address]    Report Status PENDING  Incomplete  Blood culture (routine x 2)     Status: None (Preliminary result)   Collection Time: 09/10/22 10:59 PM   Specimen: BLOOD  Result Value Ref Range Status   Specimen Description BLOOD SITE NOT SPECIFIED  Final   Special Requests   Final    BOTTLES DRAWN AEROBIC AND ANAEROBIC Blood Culture adequate volume   Culture   Final     NO GROWTH 3 DAYS Performed at [facility], [address] [place], [place] [address]    Report Status PENDING  Incomplete    Labs: CBC: Recent Labs  Lab 09/10/22 1714 09/11/22 0327 09/12/22 0241 09/13/22 0016  WBC 21.6* 11.8* 8.4 10.9*  NEUTROABS 16.2*  --   --   --   HGB 10.7* 8.3* 8.3* 11.2*  HCT 35.4* 27.6* 27.7* 36.4*  MCV 82.7 84.1 85.0 85.2  PLT 535* 319 252 294   Basic Metabolic Panel: Recent Labs  Lab 09/10/22 1714 09/11/22 0327 09/12/22 0241 09/13/22 0016  NA 142 141 142 140  K 3.5 3.3* 3.5 3.5  CL 108 112* 112* 110  CO2 20* 21* 19* 21*  GLUCOSE 120* 98 92 88  BUN 18 17 12 9   CREATININE 1.76* 1.39* 1.36* 1.31*  CALCIUM 9.4 8.3* 8.4* 8.6*  MG  --  2.0  --   --    Liver Function Tests: Recent Labs  Lab 09/10/22 1714  AST 25  ALT 15  ALKPHOS 81  BILITOT 0.8  PROT 7.4  ALBUMIN 3.7   CBG: Recent Labs  Lab 09/11/22 2002  GLUCAP 96    Discharge time spent: greater than 30 minutes.  Signed: Thad Ranger, MD [facility] 09/13/2022

## 2022-09-13 NOTE — Plan of Care (Signed)
  Problem: Education: Goal: Understanding of discharge needs will improve Outcome: Adequate for Discharge Goal: Verbalization of understanding of the causes of altered bowel function will improve Outcome: Adequate for Discharge   Problem: Activity: Goal: Ability to tolerate increased activity will improve Outcome: Adequate for Discharge   Problem: Bowel/Gastric: Goal: Gastrointestinal status for postoperative course will improve Outcome: Adequate for Discharge   Problem: Health Behavior/Discharge Planning: Goal: Identification of community resources to assist with postoperative recovery needs will improve Outcome: Adequate for Discharge   Problem: Nutritional: Goal: Will attain and maintain optimal nutritional status will improve Outcome: Adequate for Discharge   Problem: Clinical Measurements: Goal: Postoperative complications will be avoided or minimized Outcome: Adequate for Discharge   Problem: Respiratory: Goal: Respiratory status will improve Outcome: Adequate for Discharge   Problem: Skin Integrity: Goal: Will show signs of wound healing Outcome: Adequate for Discharge   Problem: Education: Goal: Knowledge of General Education information will improve Description: Including pain rating scale, medication(s)/side effects and non-pharmacologic comfort measures Outcome: Adequate for Discharge   Problem: Health Behavior/Discharge Planning: Goal: Ability to manage health-related needs will improve Outcome: Adequate for Discharge   Problem: Clinical Measurements: Goal: Ability to maintain clinical measurements within normal limits will improve Outcome: Adequate for Discharge Goal: Will remain free from infection Outcome: Adequate for Discharge Goal: Diagnostic test results will improve Outcome: Adequate for Discharge Goal: Respiratory complications will improve Outcome: Adequate for Discharge Goal: Cardiovascular complication will be avoided Outcome: Adequate for  Discharge   Problem: Activity: Goal: Risk for activity intolerance will decrease Outcome: Adequate for Discharge   Problem: Nutrition: Goal: Adequate nutrition will be maintained Outcome: Adequate for Discharge   Problem: Coping: Goal: Level of anxiety will decrease Outcome: Adequate for Discharge   Problem: Elimination: Goal: Will not experience complications related to bowel motility Outcome: Adequate for Discharge Goal: Will not experience complications related to urinary retention Outcome: Adequate for Discharge   Problem: Pain Managment: Goal: General experience of comfort will improve Outcome: Adequate for Discharge   Problem: Safety: Goal: Ability to remain free from injury will improve Outcome: Adequate for Discharge   Problem: Skin Integrity: Goal: Risk for impaired skin integrity will decrease Outcome: Adequate for Discharge   Problem: Acute Rehab PT Goals(only PT should resolve) Goal: Pt Will Ambulate Outcome: Adequate for Discharge Goal: Pt Will Go Up/Down Stairs Outcome: Adequate for Discharge Goal: PT Additional Goal #1 Outcome: Adequate for Discharge Goal: PT Additional Goal #2 Outcome: Adequate for Discharge

## 2022-09-13 NOTE — Final Progress Note (Signed)
Discharge instructions (including medications) discussed with and copy provided to patient/caregiver  Patient TOC meds were given to him and his daughter Brendan Butler was called to bring his clothes and that she can pick him up.

## 2022-09-13 NOTE — Care Management Important Message (Signed)
Important Message  Patient Details  Name: Brendan Butler MRN: [id number] Date of Birth: 02-Jan-1951   Medicare Important Message Given:  Yes     Dorena Bodo 09/13/2022, 2:22 PM

## 2022-09-14 LAB — CULTURE, BLOOD (ROUTINE X 2)
Culture: NO GROWTH
Culture: NO GROWTH

## 2022-09-15 LAB — CULTURE, BLOOD (ROUTINE X 2): Special Requests: ADEQUATE

## 2022-09-27 ENCOUNTER — Other Ambulatory Visit (HOSPITAL_COMMUNITY): Payer: Self-pay

## 2022-09-27 ENCOUNTER — Other Ambulatory Visit: Payer: Self-pay

## 2022-09-27 ENCOUNTER — Inpatient Hospital Stay: Payer: Medicare HMO | Admitting: Oncology

## 2022-09-27 ENCOUNTER — Emergency Department (HOSPITAL_COMMUNITY): Payer: Medicare HMO

## 2022-09-27 ENCOUNTER — Encounter (HOSPITAL_COMMUNITY): Payer: Self-pay

## 2022-09-27 ENCOUNTER — Inpatient Hospital Stay (HOSPITAL_COMMUNITY)
Admission: EM | Admit: 2022-09-27 | Discharge: 2022-09-30 | DRG: 683 | Disposition: A | Discharge status: Home / Self Care | Payer: Medicare HMO | Attending: Internal Medicine | Admitting: Internal Medicine

## 2022-09-27 DIAGNOSIS — E43 Unspecified severe protein-calorie malnutrition: Secondary | ICD-10-CM | POA: Insufficient documentation

## 2022-09-27 DIAGNOSIS — R112 Nausea with vomiting, unspecified: Secondary | ICD-10-CM | POA: Diagnosis present

## 2022-09-27 DIAGNOSIS — N179 Acute kidney failure, unspecified: Principal | ICD-10-CM | POA: Diagnosis present

## 2022-09-27 DIAGNOSIS — F39 Unspecified mood [affective] disorder: Secondary | ICD-10-CM | POA: Diagnosis not present

## 2022-09-27 DIAGNOSIS — Z7901 Long term (current) use of anticoagulants: Secondary | ICD-10-CM | POA: Diagnosis not present

## 2022-09-27 DIAGNOSIS — Z905 Acquired absence of kidney: Secondary | ICD-10-CM

## 2022-09-27 DIAGNOSIS — Z79899 Other long term (current) drug therapy: Secondary | ICD-10-CM | POA: Diagnosis not present

## 2022-09-27 DIAGNOSIS — N281 Cyst of kidney, acquired: Secondary | ICD-10-CM | POA: Diagnosis not present

## 2022-09-27 DIAGNOSIS — Z888 Allergy status to other drugs, medicaments and biological substances status: Secondary | ICD-10-CM | POA: Diagnosis not present

## 2022-09-27 DIAGNOSIS — K5732 Diverticulitis of large intestine without perforation or abscess without bleeding: Secondary | ICD-10-CM | POA: Diagnosis present

## 2022-09-27 DIAGNOSIS — I2510 Atherosclerotic heart disease of native coronary artery without angina pectoris: Secondary | ICD-10-CM | POA: Diagnosis present

## 2022-09-27 DIAGNOSIS — I4819 Other persistent atrial fibrillation: Secondary | ICD-10-CM | POA: Diagnosis not present

## 2022-09-27 DIAGNOSIS — E785 Hyperlipidemia, unspecified: Secondary | ICD-10-CM | POA: Diagnosis not present

## 2022-09-27 DIAGNOSIS — C189 Malignant neoplasm of colon, unspecified: Secondary | ICD-10-CM | POA: Diagnosis present

## 2022-09-27 DIAGNOSIS — N189 Chronic kidney disease, unspecified: Secondary | ICD-10-CM | POA: Diagnosis present

## 2022-09-27 DIAGNOSIS — Z820 Family history of epilepsy and other diseases of the nervous system: Secondary | ICD-10-CM

## 2022-09-27 DIAGNOSIS — Z87891 Personal history of nicotine dependence: Secondary | ICD-10-CM

## 2022-09-27 DIAGNOSIS — G4733 Obstructive sleep apnea (adult) (pediatric): Secondary | ICD-10-CM | POA: Diagnosis present

## 2022-09-27 DIAGNOSIS — Z9049 Acquired absence of other specified parts of digestive tract: Secondary | ICD-10-CM

## 2022-09-27 DIAGNOSIS — I5042 Chronic combined systolic (congestive) and diastolic (congestive) heart failure: Secondary | ICD-10-CM | POA: Diagnosis present

## 2022-09-27 DIAGNOSIS — K5792 Diverticulitis of intestine, part unspecified, without perforation or abscess without bleeding: Secondary | ICD-10-CM

## 2022-09-27 DIAGNOSIS — E860 Dehydration: Secondary | ICD-10-CM | POA: Diagnosis present

## 2022-09-27 DIAGNOSIS — N1831 Chronic kidney disease, stage 3a: Secondary | ICD-10-CM | POA: Diagnosis present

## 2022-09-27 DIAGNOSIS — I4892 Unspecified atrial flutter: Secondary | ICD-10-CM | POA: Diagnosis present

## 2022-09-27 DIAGNOSIS — Z8249 Family history of ischemic heart disease and other diseases of the circulatory system: Secondary | ICD-10-CM | POA: Diagnosis not present

## 2022-09-27 DIAGNOSIS — C182 Malignant neoplasm of ascending colon: Secondary | ICD-10-CM | POA: Diagnosis not present

## 2022-09-27 DIAGNOSIS — Z955 Presence of coronary angioplasty implant and graft: Secondary | ICD-10-CM | POA: Diagnosis not present

## 2022-09-27 DIAGNOSIS — Z7984 Long term (current) use of oral hypoglycemic drugs: Secondary | ICD-10-CM

## 2022-09-27 DIAGNOSIS — I10 Essential (primary) hypertension: Secondary | ICD-10-CM | POA: Diagnosis not present

## 2022-09-27 DIAGNOSIS — Z886 Allergy status to analgesic agent status: Secondary | ICD-10-CM | POA: Diagnosis not present

## 2022-09-27 DIAGNOSIS — Z8673 Personal history of transient ischemic attack (TIA), and cerebral infarction without residual deficits: Secondary | ICD-10-CM

## 2022-09-27 DIAGNOSIS — I130 Hypertensive heart and chronic kidney disease with heart failure and stage 1 through stage 4 chronic kidney disease, or unspecified chronic kidney disease: Secondary | ICD-10-CM | POA: Diagnosis not present

## 2022-09-27 DIAGNOSIS — F418 Other specified anxiety disorders: Secondary | ICD-10-CM | POA: Diagnosis present

## 2022-09-27 DIAGNOSIS — I252 Old myocardial infarction: Secondary | ICD-10-CM | POA: Diagnosis not present

## 2022-09-27 DIAGNOSIS — K449 Diaphragmatic hernia without obstruction or gangrene: Secondary | ICD-10-CM | POA: Diagnosis not present

## 2022-09-27 DIAGNOSIS — K5730 Diverticulosis of large intestine without perforation or abscess without bleeding: Secondary | ICD-10-CM | POA: Diagnosis not present

## 2022-09-27 DIAGNOSIS — R197 Diarrhea, unspecified: Secondary | ICD-10-CM | POA: Diagnosis present

## 2022-09-27 LAB — GLUCOSE, CAPILLARY
Glucose-Capillary: 80 mg/dL (ref 70–99)
Glucose-Capillary: 81 mg/dL (ref 70–99)
Glucose-Capillary: 91 mg/dL (ref 70–99)

## 2022-09-27 LAB — CBC WITH DIFFERENTIAL/PLATELET
Abs Immature Granulocytes: 0.05 10*3/uL (ref 0.00–0.07)
Basophils Absolute: 0.1 10*3/uL (ref 0.0–0.1)
Basophils Relative: 1 %
Eosinophils Absolute: 0.1 10*3/uL (ref 0.0–0.5)
Eosinophils Relative: 1 %
HCT: 40.3 % (ref 39.0–52.0)
Hemoglobin: 12.7 g/dL — ABNORMAL LOW (ref 13.0–17.0)
Immature Granulocytes: 1 %
Lymphocytes Relative: 29 %
Lymphs Abs: 3 10*3/uL (ref 0.7–4.0)
MCH: 25.2 pg — ABNORMAL LOW (ref 26.0–34.0)
MCHC: 31.5 g/dL (ref 30.0–36.0)
MCV: 80 fL (ref 80.0–100.0)
Monocytes Absolute: 0.9 10*3/uL (ref 0.1–1.0)
Monocytes Relative: 9 %
Neutro Abs: 6.4 10*3/uL (ref 1.7–7.7)
Neutrophils Relative %: 59 %
Platelets: 228 10*3/uL (ref 150–400)
RBC: 5.04 MIL/uL (ref 4.22–5.81)
RDW: 18.6 % — ABNORMAL HIGH (ref 11.5–15.5)
WBC: 10.6 10*3/uL — ABNORMAL HIGH (ref 4.0–10.5)
nRBC: 0 % (ref 0.0–0.2)

## 2022-09-27 LAB — COMPREHENSIVE METABOLIC PANEL
ALT: 18 U/L (ref 0–44)
AST: 18 U/L (ref 15–41)
Albumin: 3.9 g/dL (ref 3.5–5.0)
Alkaline Phosphatase: 85 U/L (ref 38–126)
Anion gap: 14 (ref 5–15)
BUN: 32 mg/dL — ABNORMAL HIGH (ref 8–23)
CO2: 22 mmol/L (ref 22–32)
Calcium: 9.3 mg/dL (ref 8.9–10.3)
Chloride: 98 mmol/L (ref 98–111)
Creatinine, Ser: 3.34 mg/dL — ABNORMAL HIGH (ref 0.61–1.24)
GFR, Estimated: 19 mL/min — ABNORMAL LOW (ref >60)
Glucose, Bld: 119 mg/dL — ABNORMAL HIGH (ref 70–99)
Potassium: 3.2 mmol/L — ABNORMAL LOW (ref 3.5–5.1)
Sodium: 134 mmol/L — ABNORMAL LOW (ref 135–145)
Total Bilirubin: 1 mg/dL (ref 0.3–1.2)
Total Protein: 7.6 g/dL (ref 6.5–8.1)

## 2022-09-27 LAB — LIPASE, BLOOD: Lipase: 42 U/L (ref 11–51)

## 2022-09-27 MED ORDER — AMIODARONE HCL 200 MG PO TABS
100.0000 mg | ORAL_TABLET | Freq: Every day | ORAL | Status: DC
  Administered 2022-09-28 – 2022-09-30 (×3): 100 mg via ORAL
  Filled 2022-09-27 (×3): qty 1

## 2022-09-27 MED ORDER — ROSUVASTATIN CALCIUM 20 MG PO TABS
20.0000 mg | ORAL_TABLET | Freq: Every day | ORAL | Status: DC
  Administered 2022-09-28 – 2022-09-30 (×3): 20 mg via ORAL
  Filled 2022-09-27 (×4): qty 1

## 2022-09-27 MED ORDER — PANTOPRAZOLE SODIUM 40 MG PO TBEC
40.0000 mg | DELAYED_RELEASE_TABLET | Freq: Every day | ORAL | Status: DC
  Administered 2022-09-28 – 2022-09-30 (×3): 40 mg via ORAL
  Filled 2022-09-27 (×3): qty 1

## 2022-09-27 MED ORDER — RIVAROXABAN 15 MG PO TABS
15.0000 mg | ORAL_TABLET | Freq: Every day | ORAL | Status: DC
  Administered 2022-09-27 – 2022-09-29 (×3): 15 mg via ORAL
  Filled 2022-09-27 (×3): qty 1

## 2022-09-27 MED ORDER — ONDANSETRON HCL 4 MG/2ML IJ SOLN
4.0000 mg | Freq: Once | INTRAMUSCULAR | Status: AC
  Administered 2022-09-27: 4 mg via INTRAVENOUS
  Filled 2022-09-27: qty 2

## 2022-09-27 MED ORDER — SODIUM CHLORIDE 0.9 % IV SOLN
2.0000 g | Freq: Once | INTRAVENOUS | Status: DC
  Filled 2022-09-27: qty 12.5

## 2022-09-27 MED ORDER — METRONIDAZOLE 500 MG/100ML IV SOLN
500.0000 mg | Freq: Once | INTRAVENOUS | Status: AC
  Administered 2022-09-27: 500 mg via INTRAVENOUS
  Filled 2022-09-27: qty 100

## 2022-09-27 MED ORDER — POTASSIUM CHLORIDE CRYS ER 20 MEQ PO TBCR
40.0000 meq | EXTENDED_RELEASE_TABLET | Freq: Once | ORAL | Status: DC
  Filled 2022-09-27: qty 2

## 2022-09-27 MED ORDER — LACTATED RINGERS IV BOLUS
1000.0000 mL | Freq: Once | INTRAVENOUS | Status: AC
  Administered 2022-09-27: 1000 mL via INTRAVENOUS

## 2022-09-27 MED ORDER — BISACODYL 5 MG PO TBEC
5.0000 mg | DELAYED_RELEASE_TABLET | Freq: Every day | ORAL | Status: DC | PRN
  Administered 2022-09-29: 5 mg via ORAL
  Filled 2022-09-27: qty 1

## 2022-09-27 MED ORDER — BUPROPION HCL ER (XL) 150 MG PO TB24
150.0000 mg | ORAL_TABLET | Freq: Every day | ORAL | Status: DC
  Administered 2022-09-28 – 2022-09-30 (×3): 150 mg via ORAL
  Filled 2022-09-27 (×3): qty 1

## 2022-09-27 MED ORDER — POTASSIUM CHLORIDE IN NACL 40-0.9 MEQ/L-% IV SOLN
INTRAVENOUS | Status: DC
  Administered 2022-09-28 (×2): 100 mL/h via INTRAVENOUS
  Filled 2022-09-27 (×7): qty 1000

## 2022-09-27 MED ORDER — PREGABALIN 75 MG PO CAPS
150.0000 mg | ORAL_CAPSULE | Freq: Two times a day (BID) | ORAL | Status: DC
  Administered 2022-09-28 – 2022-09-30 (×5): 150 mg via ORAL
  Filled 2022-09-27 (×5): qty 2

## 2022-09-27 MED ORDER — POLYETHYLENE GLYCOL 3350 17 G PO PACK
17.0000 g | PACK | Freq: Every day | ORAL | Status: DC | PRN
  Filled 2022-09-27: qty 1

## 2022-09-27 MED ORDER — PAROXETINE HCL 30 MG PO TABS
30.0000 mg | ORAL_TABLET | Freq: Every day | ORAL | Status: DC
  Administered 2022-09-28 – 2022-09-30 (×3): 30 mg via ORAL
  Filled 2022-09-27 (×3): qty 1

## 2022-09-27 MED ORDER — ACETAMINOPHEN 325 MG PO TABS: 650.0000 mg | ORAL_TABLET | Freq: Four times a day (QID) | ORAL | Status: DC | PRN

## 2022-09-27 MED ORDER — SODIUM CHLORIDE 0.9% FLUSH
3.0000 mL | Freq: Two times a day (BID) | INTRAVENOUS | Status: DC
  Administered 2022-09-28 – 2022-09-29 (×2): 3 mL via INTRAVENOUS

## 2022-09-27 MED ORDER — ONDANSETRON HCL 4 MG/2ML IJ SOLN: 4.0000 mg | Freq: Four times a day (QID) | INTRAMUSCULAR | Status: DC | PRN

## 2022-09-27 MED ORDER — ACETAMINOPHEN 650 MG RE SUPP: 650.0000 mg | Freq: Four times a day (QID) | RECTAL | Status: DC | PRN

## 2022-09-27 MED ORDER — OXYCODONE HCL 5 MG PO TABS: 5.0000 mg | ORAL_TABLET | Freq: Four times a day (QID) | ORAL | Status: DC | PRN

## 2022-09-27 MED ORDER — SODIUM CHLORIDE 0.9 % IV SOLN
2.0000 g | INTRAVENOUS | Status: DC
  Administered 2022-09-27 – 2022-09-29 (×3): 2 g via INTRAVENOUS
  Filled 2022-09-27 (×3): qty 20

## 2022-09-27 MED ORDER — HYDRALAZINE HCL 20 MG/ML IJ SOLN: 5.0000 mg | INTRAMUSCULAR | Status: DC | PRN

## 2022-09-27 MED ORDER — QUETIAPINE FUMARATE 25 MG PO TABS
25.0000 mg | ORAL_TABLET | Freq: Every day | ORAL | Status: DC
  Administered 2022-09-27 – 2022-09-29 (×3): 25 mg via ORAL
  Filled 2022-09-27 (×3): qty 1

## 2022-09-27 MED ORDER — IOHEXOL 9 MG/ML PO SOLN
500.0000 mL | ORAL | Status: AC
  Administered 2022-09-27: 500 mL via ORAL

## 2022-09-27 MED ORDER — METRONIDAZOLE 500 MG/100ML IV SOLN
500.0000 mg | Freq: Two times a day (BID) | INTRAVENOUS | Status: DC
  Administered 2022-09-28 – 2022-09-30 (×5): 500 mg via INTRAVENOUS
  Filled 2022-09-27 (×5): qty 100

## 2022-09-27 MED ORDER — MORPHINE SULFATE (PF) 2 MG/ML IV SOLN: 2.0000 mg | INTRAVENOUS | Status: DC | PRN

## 2022-09-27 MED ORDER — ONDANSETRON HCL 4 MG PO TABS: 4.0000 mg | ORAL_TABLET | Freq: Four times a day (QID) | ORAL | Status: DC | PRN

## 2022-09-27 NOTE — ED Notes (Signed)
ED TO INPATIENT HANDOFF REPORT  ED Nurse Name and Phone #: Nicholos Johns [phone]  S Name/Age/Gender Brendan Butler 72 y.o. male Room/Bed: 032C/032C  Code Status   Code Status: Prior  Home/SNF/Other Home Patient oriented to: self, place, time, and situation Is this baseline? Yes   Triage Complete: Triage complete  Chief Complaint AKI (acute kidney injury) (HCC) [N17.9]  Triage Note Pt arrives via POV. Pt reports abdominal pain, nausea, vomiting, and diarrhea since being discharged from the hospital last week. Pt reports intermittent fevers. Pt is AxOx4. Hx of colon cancer.    Allergies Allergies  Allergen Reactions   Levitra [Vardenafil] Other (See Comments)    Blurred vision   Aspirin Hives   Lipitor [Atorvastatin] Other (See Comments)    Muscle cramps; patient states he does not take medication at home    Level of Care/Admitting Diagnosis ED Disposition     ED Disposition  Admit   Condition  --   Comment  Hospital Area: [facility] [[id number]]  Level of Care: Telemetry Cardiac [[id number]]  May admit patient to [facility] or [facility] if equivalent level of care is available:: Yes  Covid Evaluation: Asymptomatic - no recent exposure (last 10 days) testing not required  Diagnosis: AKI (acute kidney injury) [facility]) [[id number]]  Admitting Physician: Jonah Blue [[id number]]  Attending Physician: Jonah Blue [[id number]]  Certification:: I certify this patient will need inpatient services for at least 2 midnights  Estimated Length of Stay: 3          B Medical/Surgery History Past Medical History:  Diagnosis Date   Basal ganglia infarction (HCC)    Bradycardia    CAD (coronary artery disease)    Chronic HFrEF (heart failure with reduced ejection fraction) (HCC)    Chronic kidney disease, stage 3 (HCC)    Colon cancer (HCC) 08/26/2022   Erectile dysfunction    Hyperlipidemia LDL goal <70    Hypertension    OSA (obstructive sleep apnea)    No CPAP use  overnight   PAF (paroxysmal atrial fibrillation) (HCC)    Paroxysmal atrial flutter (HCC)    Premature atrial contractions    Pulmonary nodules    Past Surgical History:  Procedure Laterality Date   BACK SURGERY  2007   Decompression lumbar laminectomy and micro discectomy, L4-5   BIOPSY  08/20/2022   Procedure: BIOPSY;  Surgeon: Vida Rigger, MD;  Location: [facility] ENDOSCOPY;  Service: Gastroenterology;;   BRONCHIAL BIOPSY  12/28/2021   Procedure: BRONCHIAL BIOPSIES;  Surgeon: Leslye Peer, MD;  Location: [facility] ENDOSCOPY;  Service: Pulmonary;;   BRONCHIAL BRUSHINGS  12/28/2021   Procedure: BRONCHIAL BRUSHINGS;  Surgeon: Leslye Peer, MD;  Location: [facility] ENDOSCOPY;  Service: Pulmonary;;   BRONCHIAL NEEDLE ASPIRATION BIOPSY  12/28/2021   Procedure: BRONCHIAL NEEDLE ASPIRATION BIOPSIES;  Surgeon: Leslye Peer, MD;  Location: [facility] ENDOSCOPY;  Service: Pulmonary;;   BRONCHIAL WASHINGS  12/28/2021   Procedure: BRONCHIAL WASHINGS;  Surgeon: Leslye Peer, MD;  Location: [facility] ENDOSCOPY;  Service: Pulmonary;;   CARDIAC CATHETERIZATION  2007   patent stent to LAD and normal Cors   CARDIOVERSION N/A 05/10/2022   Procedure: CARDIOVERSION;  Surgeon: Sande Rives, MD;  Location: [facility] ENDOSCOPY;  Service: Cardiovascular;  Laterality: N/A;   CARDIOVERSION N/A 05/28/2022   Procedure: CARDIOVERSION;  Surgeon: Wendall Stade, MD;  Location: [facility] ENDOSCOPY;  Service: Cardiovascular;  Laterality: N/A;   CHOLECYSTECTOMY N/A 12/26/2021   Procedure: LAPAROSCOPIC CHOLECYSTECTOMY, LAPAROSCOPIC LYSIS OF ADHESIONS GREATER THAN  1 HOUR;  Surgeon: [name], MD;  Location: [facility] OR;  Service: General;  Laterality: N/A;   COLONOSCOPY WITH PROPOFOL N/A 08/20/2022   Procedure: COLONOSCOPY WITH PROPOFOL;  Surgeon: Vida Rigger, MD;  Location: [facility] ENDOSCOPY;  Service: Gastroenterology;  Laterality: N/A;   CORONARY ANGIOPLASTY WITH STENT PLACEMENT  2001   to LAD   CORONARY ANGIOPLASTY WITH STENT PLACEMENT  12/29/12   STEMI  with promus DES to LAD   CORONARY ANGIOPLASTY WITH STENT PLACEMENT  01/05/13   STEMI with thrombosis in stent to LAD   ESOPHAGOGASTRODUODENOSCOPY (EGD) WITH PROPOFOL N/A 08/20/2022   Procedure: ESOPHAGOGASTRODUODENOSCOPY (EGD) WITH PROPOFOL;  Surgeon: Vida Rigger, MD;  Location: [facility] ENDOSCOPY;  Service: Gastroenterology;  Laterality: N/A;   FIDUCIAL MARKER PLACEMENT  12/28/2021   Procedure: FIDUCIAL MARKER PLACEMENT;  Surgeon: Leslye Peer, MD;  Location: [facility] ENDOSCOPY;  Service: Pulmonary;;   LAPAROSCOPIC PARTIAL COLECTOMY Right 08/23/2022   Procedure: LAPAROSCOPIC CONVERTED TO OPEN HEMICOLECTOMY;  Surgeon: Axel Filler, MD;  Location: [facility] OR;  Service: General;  Laterality: Right;   LEFT HEART CATH N/A 12/29/2012   Procedure: LEFT HEART CATH;  Surgeon: Corky Crafts, MD;  Location: [facility] CATH LAB;  Service: Cardiovascular;  Laterality: N/A;   LEFT HEART CATHETERIZATION WITH CORONARY ANGIOGRAM N/A 01/05/2013   Procedure: LEFT HEART CATHETERIZATION WITH CORONARY ANGIOGRAM;  Surgeon: Micheline Chapman, MD;  Location: [facility] CATH LAB;  Service: Cardiovascular;  Laterality: N/A;   LEFT HEART CATHETERIZATION WITH CORONARY ANGIOGRAM N/A 04/22/2013   Procedure: LEFT HEART CATHETERIZATION WITH CORONARY ANGIOGRAM;  Surgeon: Lennette Bihari, MD;  Location: [facility] CATH LAB;  Service: Cardiovascular;  Laterality: N/A;   PARTIAL NEPHRECTOMY Left 1975   PATIENT ONLY HAS ONE KIDNEY   PERCUTANEOUS CORONARY STENT INTERVENTION (PCI-S)  12/29/2012   Procedure: PERCUTANEOUS CORONARY STENT INTERVENTION (PCI-S);  Surgeon: Corky Crafts, MD;  Location: [facility] CATH LAB;  Service: Cardiovascular;;   SUBMUCOSAL TATTOO INJECTION  08/20/2022   Procedure: SUBMUCOSAL TATTOO INJECTION;  Surgeon: Vida Rigger, MD;  Location: [facility] ENDOSCOPY;  Service: Gastroenterology;;   TEE WITHOUT CARDIOVERSION N/A 05/10/2022   Procedure: TRANSESOPHAGEAL ECHOCARDIOGRAM (TEE);  Surgeon: Sande Rives, MD;  Location: [facility] ENDOSCOPY;  Service:  Cardiovascular;  Laterality: N/A;   TEE WITHOUT CARDIOVERSION N/A 05/28/2022   Procedure: TRANSESOPHAGEAL ECHOCARDIOGRAM (TEE);  Surgeon: Wendall Stade, MD;  Location: [facility] ENDOSCOPY;  Service: Cardiovascular;  Laterality: N/A;     A IV Location/Drains/Wounds Patient Lines/Drains/Airways Status     Active Line/Drains/Airways     Name Placement date Placement time Site Days   Peripheral IV 09/27/22 20 G Posterior;Right Forearm 09/27/22  1037  Forearm  less than 1            Intake/Output Last 24 hours No intake or output data in the 24 hours ending 09/27/22 1626  Labs/Imaging Results for orders placed or performed during the hospital encounter of 09/27/22 (from the past 48 hour(s))  Lipase, blood     Status: None   Collection Time: 09/27/22 10:24 AM  Result Value Ref Range   Lipase 42 11 - 51 U/L    Comment: Performed at [facility], [address] [place], [place] [address]  Comprehensive metabolic panel     Status: Abnormal   Collection Time: 09/27/22 10:24 AM  Result Value Ref Range   Sodium 134 (L) 135 - 145 mmol/L   Potassium 3.2 (L) 3.5 - 5.1 mmol/L   Chloride 98 98 - 111 mmol/L   CO2 22 22 -  32 mmol/L   Glucose, Bld 119 (H) 70 - 99 mg/dL    Comment: Glucose reference range applies only to samples taken after fasting for at least 8 hours.   BUN 32 (H) 8 - 23 mg/dL   Creatinine, Ser 1.61 (H) 0.61 - 1.24 mg/dL   Calcium 9.3 8.9 - 09.6 mg/dL   Total Protein 7.6 6.5 - 8.1 g/dL   Albumin 3.9 3.5 - 5.0 g/dL   AST 18 15 - 41 U/L   ALT 18 0 - 44 U/L   Alkaline Phosphatase 85 38 - 126 U/L   Total Bilirubin 1.0 0.3 - 1.2 mg/dL   GFR, Estimated 19 (L) >60 mL/min    Comment: (NOTE) Calculated using the CKD-EPI Creatinine Equation (2021)    Anion gap 14 5 - 15    Comment: Performed at [facility], [address] [place], [place] [address]  CBC with Differential     Status: Abnormal   Collection Time: 09/27/22 10:24 AM  Result Value Ref Range   WBC 10.6  (H) 4.0 - 10.5 K/uL   RBC 5.04 4.22 - 5.81 MIL/uL   Hemoglobin 12.7 (L) 13.0 - 17.0 g/dL   HCT 98.1 19.1 - 47.8 %   MCV 80.0 80.0 - 100.0 fL   MCH 25.2 (L) 26.0 - 34.0 pg   MCHC 31.5 30.0 - 36.0 g/dL   RDW 29.5 (H) 62.1 - 30.8 %   Platelets 228 150 - 400 K/uL    Comment: REPEATED TO VERIFY   nRBC 0.0 0.0 - 0.2 %   Neutrophils Relative % 59 %   Neutro Abs 6.4 1.7 - 7.7 K/uL   Lymphocytes Relative 29 %   Lymphs Abs 3.0 0.7 - 4.0 K/uL   Monocytes Relative 9 %   Monocytes Absolute 0.9 0.1 - 1.0 K/uL   Eosinophils Relative 1 %   Eosinophils Absolute 0.1 0.0 - 0.5 K/uL   Basophils Relative 1 %   Basophils Absolute 0.1 0.0 - 0.1 K/uL   Immature Granulocytes 1 %   Abs Immature Granulocytes 0.05 0.00 - 0.07 K/uL    Comment: Performed at [facility], [address], [place], [place] [address]   CT ABDOMEN PELVIS WO CONTRAST  Result Date: 09/27/2022 CLINICAL DATA:  Nonlocalized abdominal pain. EXAM: CT ABDOMEN AND PELVIS WITHOUT CONTRAST TECHNIQUE: Multidetector CT imaging of the abdomen and pelvis was performed following the standard protocol without IV contrast. RADIATION DOSE REDUCTION: This exam was performed according to the departmental dose-optimization program which includes automated exposure control, adjustment of the mA and/or kV according to patient size and/or use of iterative reconstruction technique. COMPARISON:  09/10/2022 FINDINGS: Lower chest: Unremarkable. Hepatobiliary: No suspicious focal abnormality in the liver on this study without intravenous contrast. Gallbladder is surgically absent. No intrahepatic or extrahepatic biliary dilation. Pancreas: No focal mass lesion. No dilatation of the main duct. No intraparenchymal cyst. No peripancreatic edema. Spleen: No splenomegaly. No suspicious focal mass lesion. Adrenals/Urinary Tract: No adrenal nodule or mass. Small cyst lower pole right kidney is stable. No followup imaging is recommended. Right ureter unremarkable. Left  nephrectomy. The urinary bladder appears normal for the degree of distention. Stomach/Bowel: Tiny hiatal hernia. Stomach otherwise unremarkable. Duodenum is normally positioned as is the ligament of Treitz. Multiple duodenal diverticuli noted. No small bowel wall thickening. No small bowel dilatation. Status post right hemicolectomy. Stranding in the fat around the ileocolic anastomosis is similar to prior. Diverticular disease is noted in the left colon.  There is some subtle pericolonic edema along the distal descending colon (images 41-47 of series 2) that is new in the interval suggesting diverticulitis. Vascular/Lymphatic: There is moderate atherosclerotic calcification of the abdominal aorta without aneurysm. There is no gastrohepatic or hepatoduodenal ligament lymphadenopathy. No retroperitoneal or mesenteric lymphadenopathy. No pelvic sidewall lymphadenopathy. Reproductive: The prostate gland and seminal vesicles are unremarkable. Other: No intraperitoneal free fluid. Musculoskeletal: No worrisome lytic or sclerotic osseous abnormality. Stable appearance L1 superior endplate compression deformity. IMPRESSION: 1. Left colonic diverticulosis with some subtle pericolonic edema along the distal descending colon, new in the interval suggesting diverticulitis. No evidence for perforation or abscess. 2. Status post right hemicolectomy with stable appearance of stranding in the fat around the ileocolic anastomosis. Given the relative stability of this finding, it may reflect scarring. 3. Status post left nephrectomy. 4. Tiny hiatal hernia. 5.  Aortic Atherosclerosis (ICD10-I70.0). Electronically Signed   By: Kennith Center M.D.   On: 09/27/2022 15:41    Pending Labs Unresulted Labs (From admission, onward)     Start     Ordered   09/27/22 1014  Urinalysis, Routine w reflex microscopic -Urine, Clean Catch  Once,   URGENT       Question:  Specimen Source  Answer:  Urine, Clean Catch   09/27/22 1013             Vitals/Pain Today's Vitals   09/27/22 1515 09/27/22 1530 09/27/22 1545 09/27/22 1600  BP:      Pulse: (!) 52 (!) 57 (!) 59 (!) 58  Resp: 15 16 13 16   Temp:      TempSrc:      SpO2: 97% 95% 97% 95%  Weight:      Height:      PainSc:        Isolation Precautions No active isolations  Medications Medications  ceFEPIme (MAXIPIME) 2 g in sodium chloride 0.9 % 100 mL IVPB (has no administration in time range)    And  metroNIDAZOLE (FLAGYL) IVPB 500 mg (has no administration in time range)  potassium chloride SA (KLOR-CON M) CR tablet 40 mEq (has no administration in time range)  lactated ringers bolus 1,000 mL (0 mLs Intravenous Stopped 09/27/22 1221)  ondansetron (ZOFRAN) injection 4 mg (4 mg Intravenous Given 09/27/22 1102)  lactated ringers bolus 1,000 mL (0 mLs Intravenous Stopped 09/27/22 1433)  iohexol (OMNIPAQUE) 9 MG/ML oral solution 500 mL (500 mLs Oral Contrast Given 09/27/22 1254)    Mobility walks with person assist     Focused Assessments Cardiac Assessment Handoff:    Lab Results  Component Value Date   CKTOTAL 141 01/05/2013   CKMB 3.7 01/05/2013   TROPONINI <0.03 01/28/2016   Lab Results  Component Value Date   DDIMER 0.30 08/17/2022   Does the Patient currently have chest pain? No    R Recommendations: See Admitting Provider Note  Report given to:   Additional Notes: .

## 2022-09-27 NOTE — ED Notes (Signed)
CT informed pt is finished with oral contrast.

## 2022-09-27 NOTE — ED Provider Notes (Signed)
[name] Provider Note   CSN: [id number] Arrival date & time: 09/27/22  4098     History  Chief Complaint  Patient presents with   Abdominal Pain    n   Vomiting    Brendan Butler is a 72 y.o. male.   Abdominal Pain    Patient has a history of hypertension obstructive sleep apnea hyperlipidemia atrial flutter coronary artery disease stroke, heart failure and recent operation for a partial colectomy and subsequent episode of sepsis to colitis.  Patient states he has been having symptoms ever since his surgery in June.  He was admitted to the hospital on July 15 last month for sepsis and colitis.  Patient states ever since leaving the hospital he has had persistent issues of nausea and occasional vomiting. He never had any follow-up with oncology.  Family states he is supposed to follow-up with general surgery earlier this week but he was not feeling well so he did not go to that appointment.  He was supposed to follow-up with oncology today but when the daughter told him about his persistent symptoms they suggested he come to the ED.  Patient states he has not had any vomiting in a couple of days but he continues to feel nauseated.  He gets better when he takes the Zofran but it stops after that.  He denies any fevers.  No diarrhea. Home Medications Prior to Admission medications   Medication Sig Start Date End Date Taking? Authorizing Provider  acetaminophen (TYLENOL) 500 MG tablet Take 2 tablets (1,000 mg total) by mouth every 6 (six) hours. 08/28/22   Swayze, Ava, DO  amiodarone (PACERONE) 200 MG tablet Take 1/2 tablet (100 mg total) by mouth daily. 08/29/22   Swayze, Ava, DO  buPROPion (WELLBUTRIN XL) 150 MG 24 hr tablet Take 1 tablet (150 mg total) by mouth daily. 05/24/22   Elgergawy, Leana Roe, MD  empagliflozin (JARDIANCE) 10 MG TABS tablet Take 1 tablet (10 mg total) by mouth daily. 06/18/22   Marjie Skiff E, PA-C  ferrous sulfate  325 (65 FE) MG EC tablet Take 1 tablet (325 mg total) by mouth 2 (two) times daily. 07/31/22 09/10/22  Jerald Kief, MD  fluticasone (FLONASE) 50 MCG/ACT nasal spray Place 1 spray into both nostrils daily as needed for allergies. 12/24/21   [provider]  furosemide (LASIX) 40 MG tablet Take 1 tablet (40 mg total) by mouth 2 (two) times daily. 08/28/22   Swayze, Ava, DO  lactose free nutrition (BOOST PLUS) LIQD Take 237 mLs by mouth 2 (two) times daily between meals. Patient not taking: Reported on 09/10/2022 08/29/22   Swayze, Ava, DO  methocarbamol (ROBAXIN) 500 MG tablet Take 1 tablet (500 mg total) by mouth every 8 (eight) hours as needed for muscle spasms. 08/28/22   Swayze, Ava, DO  Multiple Vitamin (MULTIVITAMIN WITH MINERALS) TABS tablet Take 1 tablet by mouth daily. 08/29/22   Swayze, Ava, DO  ondansetron (ZOFRAN) 4 MG tablet Take 1 tablet (4 mg total) by mouth every 8 (eight) hours as needed for nausea or vomiting. 09/13/22 09/13/23  Rai, Ripudeep K, MD  oxyCODONE (OXY IR/ROXICODONE) 5 MG immediate release tablet Take 1 tablet (5 mg total) by mouth every 6 (six) hours as needed for moderate pain. 08/28/22   Swayze, Ava, DO  pantoprazole (PROTONIX) 40 MG tablet Take 1 tablet (40 mg total) by mouth daily. 05/11/22   Pokhrel, Rebekah Chesterfield, MD  PARoxetine (PAXIL) 30 MG  tablet Take 30 mg by mouth daily.    [provider]  polyethylene glycol powder (GLYCOLAX/MIRALAX) 17 GM/SCOOP powder Take 17g (1 capful) as directed by mouth daily as needed for mild constipation. 08/28/22   Swayze, Ava, DO  potassium chloride SA (KLOR-CON M) 20 MEQ tablet Take 2 tablets (40 mEq total) by mouth daily. 08/28/22   Swayze, Ava, DO  pregabalin (LYRICA) 150 MG capsule Take 1 capsule (150 mg total) by mouth 2 (two) times daily. 05/24/22   Elgergawy, Leana Roe, MD  QUEtiapine (SEROQUEL) 25 MG tablet Take 1 tablet (25 mg total) by mouth at bedtime. 05/10/22   Pokhrel, Rebekah Chesterfield, MD  Rivaroxaban (XARELTO) 15 MG TABS tablet Take 1  tablet (15 mg total) by mouth daily with supper. 08/28/22   Swayze, Ava, DO  rosuvastatin (CRESTOR) 20 MG tablet Take 1 tablet (20 mg total) by mouth daily. 06/18/22   Marjie Skiff E, PA-C  sacubitril-valsartan (ENTRESTO) 24-26 MG Take 1 tablet by mouth 2 (two) times daily. 06/18/22   Corrin Parker, PA-C  spironolactone (ALDACTONE) 25 MG tablet Take 1/2 tablet (12.5 mg total) by mouth daily. 06/18/22   Marjie Skiff E, PA-C      Allergies    Levitra [vardenafil], Aspirin, and Lipitor [atorvastatin]    Review of Systems   Review of Systems  Gastrointestinal:  Positive for abdominal pain.    Physical Exam Updated Vital Signs BP (!) 152/66   Pulse (!) 55   Temp 98.1 F (36.7 C) (Oral)   Resp 16   Ht 1.803 m (5\' 11" )   Wt 86.2 kg   SpO2 97%   BMI 26.50 kg/m  Physical Exam Vitals and nursing note reviewed.  Constitutional:      General: He is not in acute distress.    Appearance: He is well-developed.  HENT:     Head: Normocephalic and atraumatic.     Right Ear: External ear normal.     Left Ear: External ear normal.  Eyes:     General: No scleral icterus.       Right eye: No discharge.        Left eye: No discharge.     Conjunctiva/sclera: Conjunctivae normal.  Neck:     Trachea: No tracheal deviation.  Cardiovascular:     Rate and Rhythm: Normal rate and regular rhythm.  Pulmonary:     Effort: Pulmonary effort is normal. No respiratory distress.     Breath sounds: Normal breath sounds. No stridor. No wheezing or rales.  Abdominal:     General: Bowel sounds are normal. There is no distension.     Palpations: Abdomen is soft.     Tenderness: There is no abdominal tenderness. There is no guarding or rebound.  Musculoskeletal:        General: No tenderness or deformity.     Cervical back: Neck supple.  Skin:    General: Skin is warm and dry.     Findings: No rash.  Neurological:     General: No focal deficit present.     Mental Status: He is alert.      Cranial Nerves: No cranial nerve deficit, dysarthria or facial asymmetry.     Sensory: No sensory deficit.     Motor: No abnormal muscle tone or seizure activity.     Coordination: Coordination normal.  Psychiatric:        Mood and Affect: Mood normal.     ED Results / Procedures / Treatments   Labs (  all labs ordered are listed, but only abnormal results are displayed) Labs Reviewed  COMPREHENSIVE METABOLIC PANEL - Abnormal; Notable for the following components:      Result Value   Sodium 134 (*)    Potassium 3.2 (*)    Glucose, Bld 119 (*)    BUN 32 (*)    Creatinine, Ser 3.34 (*)    GFR, Estimated 19 (*)    All other components within normal limits  CBC WITH DIFFERENTIAL/PLATELET - Abnormal; Notable for the following components:   WBC 10.6 (*)    Hemoglobin 12.7 (*)    MCH 25.2 (*)    RDW 18.6 (*)    All other components within normal limits  LIPASE, BLOOD  URINALYSIS, ROUTINE W REFLEX MICROSCOPIC    EKG EKG Interpretation Date/Time:  Thursday September 27 2022 10:06:12 EDT Ventricular Rate:  64 PR Interval:  189 QRS Duration:  106 QT Interval:  402 QTC Calculation: 415 R Axis:   -7  Text Interpretation: Sinus rhythm Low voltage, precordial leads Anteroseptal infarct, old No significant change since last tracing Confirmed by Linwood Dibbles [phone]) on 09/27/2022 10:08:37 AM  Radiology CT ABDOMEN PELVIS WO CONTRAST  Result Date: 09/27/2022 CLINICAL DATA:  Nonlocalized abdominal pain. EXAM: CT ABDOMEN AND PELVIS WITHOUT CONTRAST TECHNIQUE: Multidetector CT imaging of the abdomen and pelvis was performed following the standard protocol without IV contrast. RADIATION DOSE REDUCTION: This exam was performed according to the departmental dose-optimization program which includes automated exposure control, adjustment of the mA and/or kV according to patient size and/or use of iterative reconstruction technique. COMPARISON:  09/10/2022 FINDINGS: Lower chest: Unremarkable. Hepatobiliary:  No suspicious focal abnormality in the liver on this study without intravenous contrast. Gallbladder is surgically absent. No intrahepatic or extrahepatic biliary dilation. Pancreas: No focal mass lesion. No dilatation of the main duct. No intraparenchymal cyst. No peripancreatic edema. Spleen: No splenomegaly. No suspicious focal mass lesion. Adrenals/Urinary Tract: No adrenal nodule or mass. Small cyst lower pole right kidney is stable. No followup imaging is recommended. Right ureter unremarkable. Left nephrectomy. The urinary bladder appears normal for the degree of distention. Stomach/Bowel: Tiny hiatal hernia. Stomach otherwise unremarkable. Duodenum is normally positioned as is the ligament of Treitz. Multiple duodenal diverticuli noted. No small bowel wall thickening. No small bowel dilatation. Status post right hemicolectomy. Stranding in the fat around the ileocolic anastomosis is similar to prior. Diverticular disease is noted in the left colon. There is some subtle pericolonic edema along the distal descending colon (images 41-47 of series 2) that is new in the interval suggesting diverticulitis. Vascular/Lymphatic: There is moderate atherosclerotic calcification of the abdominal aorta without aneurysm. There is no gastrohepatic or hepatoduodenal ligament lymphadenopathy. No retroperitoneal or mesenteric lymphadenopathy. No pelvic sidewall lymphadenopathy. Reproductive: The prostate gland and seminal vesicles are unremarkable. Other: No intraperitoneal free fluid. Musculoskeletal: No worrisome lytic or sclerotic osseous abnormality. Stable appearance L1 superior endplate compression deformity. IMPRESSION: 1. Left colonic diverticulosis with some subtle pericolonic edema along the distal descending colon, new in the interval suggesting diverticulitis. No evidence for perforation or abscess. 2. Status post right hemicolectomy with stable appearance of stranding in the fat around the ileocolic anastomosis.  Given the relative stability of this finding, it may reflect scarring. 3. Status post left nephrectomy. 4. Tiny hiatal hernia. 5.  Aortic Atherosclerosis (ICD10-I70.0). Electronically Signed   By: Kennith Center M.D.   On: 09/27/2022 15:41    Procedures Procedures    Medications Ordered in ED Medications  ceFEPIme (MAXIPIME) 2  g in sodium chloride 0.9 % 100 mL IVPB (has no administration in time range)    And  metroNIDAZOLE (FLAGYL) IVPB 500 mg (has no administration in time range)  lactated ringers bolus 1,000 mL (0 mLs Intravenous Stopped 09/27/22 1221)  ondansetron (ZOFRAN) injection 4 mg (4 mg Intravenous Given 09/27/22 1102)  lactated ringers bolus 1,000 mL (0 mLs Intravenous Stopped 09/27/22 1433)  iohexol (OMNIPAQUE) 9 MG/ML oral solution 500 mL (500 mLs Oral Contrast Given 09/27/22 1254)    ED Course/ Medical Decision Making/ A&P Clinical Course as of 09/28/22 0711  Thu Sep 27, 2022  1227 CBC shows slight increase in white blood cell count [JK]  1227 Metabolic panel does show elevated creatinine of 3.34, increased compared to previous [JK]  1549 CT scan shows left colonic diverticulosis with some subtle pericolonic edema along the descending colon.  Possible diverticulitis.  Stable findings associated with right hemicolectomy [JK]  1605 Case discussed with Dr Ophelia Charter [JK]    Clinical Course User Index [JK] Linwood Dibbles, MD                                 Medical Decision Making Problems Addressed: AKI (acute kidney injury) [facility]): acute illness or injury that poses a threat to life or bodily functions Diverticulitis: acute illness or injury that poses a threat to life or bodily functions  Amount and/or Complexity of Data Reviewed Labs: ordered. Decision-making details documented in ED Course. Radiology: ordered and independent interpretation performed.  Risk Prescription drug management. Decision regarding hospitalization.   Patient presented to ED with complaints of persistent  nausea in the setting of recent partial colectomy in the setting of colon cancer diagnosis.  Patient labs were notable for acute kidney injury with signs of elevated BUN and creatinine.  I suspect this is related to his decreased p.o. intake from his persistent nausea.  No signs of severe leukocytosis or evolving sepsis.  No evidence of acute blood loss anemia.  Patient CT scan was performed to evaluate for any surgical complications.  No surgical complications noted however the patient does have evidence of mild diverticulitis.  Will start him on antibiotics.  I will consult the medical service for admission        Final Clinical Impression(s) / ED Diagnoses Final diagnoses:  AKI (acute kidney injury) [facility])  Diverticulitis    Rx / DC Orders ED Discharge Orders     None         Linwood Dibbles, MD 09/28/22 [phone]

## 2022-09-27 NOTE — ED Notes (Signed)
Started PO Contrast for CT

## 2022-09-27 NOTE — ED Notes (Signed)
Got patient into a gown on the monitor did EKG shown to Dr Lynelle Doctor

## 2022-09-27 NOTE — ED Notes (Signed)
Patient transported to CT 

## 2022-09-27 NOTE — H&P (Addendum)
History and Physical    Patient: Brendan Butler RXV:[id number] DOB: [birth year] DOA: 09/27/2022 DOS: the patient was seen and examined on 09/27/2022 PCP: [name], MD  Patient coming from: Home - lives alone; NOK: Daughter, Beulah Gandy, [phone]   Chief Complaint:  Nausea  HPI: Brendan Butler is a 72 y.o. male with medical history significant of afib on Xarelto, CVA, HFrEF, stage 3 CKD, HTN, HLD, and OSA presenting with abdominal pain and n/v since hospital discharge.   He was last hospitalized from 7/15-18 with n/v and concern for colitis following recent (6/30) hemicolectomy for colon cancer.  He was treated with Augmentin.  He reports that he has been sick, unable to keep liquids or food down, can't even keep his medication down.  He is not sure if he took Augmentin after leaving the hospital - his daughter fixes his medications for him.  He felt better for maybe a day or two but he feels like the medications are tearing up his stomach.  He is having diarrhea about once a day but none in the last few days.  Nausea is present, comes and goes.  +vomiting, less often, maybe every other day but none in the last few days.  He just feels like he can't eat at all.  He has severe diffuse abdominal pain and nausea with any PO intake.  Fever, subjective, yesterday.  I spoke to his daughter.  He was taking it and continued until he got sick again, at which point he had one pill.  He has lost so much weight, she has been really worried about him.    ER Course:  AKI, ?diverticulitis.  Recent colon cancer hospitalizations, hemicolectomy in June and admitted in July with colitis.  Persistent nausea, rare vomiting.       Review of Systems: As mentioned in the history of present illness. All other systems reviewed and are negative. Past Medical History:  Diagnosis Date   Basal ganglia infarction (HCC)    Bradycardia    CAD (coronary artery disease)    Chronic HFrEF (heart failure with  reduced ejection fraction) (HCC)    Chronic kidney disease, stage 3 (HCC)    Colon cancer (HCC) 08/26/2022   Erectile dysfunction    Hyperlipidemia LDL goal <70    Hypertension    OSA (obstructive sleep apnea)    No CPAP use overnight   PAF (paroxysmal atrial fibrillation) (HCC)    Paroxysmal atrial flutter (HCC)    Premature atrial contractions    Pulmonary nodules    Past Surgical History:  Procedure Laterality Date   BACK SURGERY  2007   Decompression lumbar laminectomy and micro discectomy, L4-5   BIOPSY  08/20/2022   Procedure: BIOPSY;  Surgeon: Vida Rigger, MD;  Location: [facility] ENDOSCOPY;  Service: Gastroenterology;;   BRONCHIAL BIOPSY  12/28/2021   Procedure: BRONCHIAL BIOPSIES;  Surgeon: Leslye Peer, MD;  Location: [facility] ENDOSCOPY;  Service: Pulmonary;;   BRONCHIAL BRUSHINGS  12/28/2021   Procedure: BRONCHIAL BRUSHINGS;  Surgeon: Leslye Peer, MD;  Location: [facility] ENDOSCOPY;  Service: Pulmonary;;   BRONCHIAL NEEDLE ASPIRATION BIOPSY  12/28/2021   Procedure: BRONCHIAL NEEDLE ASPIRATION BIOPSIES;  Surgeon: Leslye Peer, MD;  Location: [facility] ENDOSCOPY;  Service: Pulmonary;;   BRONCHIAL WASHINGS  12/28/2021   Procedure: BRONCHIAL WASHINGS;  Surgeon: Leslye Peer, MD;  Location: [facility] ENDOSCOPY;  Service: Pulmonary;;   CARDIAC CATHETERIZATION  2007   patent stent to LAD and normal Cors   CARDIOVERSION N/A 05/10/2022  Procedure: CARDIOVERSION;  Surgeon: Sande Rives, MD;  Location: [facility] ENDOSCOPY;  Service: Cardiovascular;  Laterality: N/A;   CARDIOVERSION N/A 05/28/2022   Procedure: CARDIOVERSION;  Surgeon: Wendall Stade, MD;  Location: [facility] ENDOSCOPY;  Service: Cardiovascular;  Laterality: N/A;   CHOLECYSTECTOMY N/A 12/26/2021   Procedure: LAPAROSCOPIC CHOLECYSTECTOMY, LAPAROSCOPIC LYSIS OF ADHESIONS GREATER THAN 1 HOUR;  Surgeon: [name], MD;  Location: [facility] OR;  Service: General;  Laterality: N/A;   COLONOSCOPY WITH PROPOFOL N/A 08/20/2022   Procedure: COLONOSCOPY WITH  PROPOFOL;  Surgeon: Vida Rigger, MD;  Location: [facility] ENDOSCOPY;  Service: Gastroenterology;  Laterality: N/A;   CORONARY ANGIOPLASTY WITH STENT PLACEMENT  2001   to LAD   CORONARY ANGIOPLASTY WITH STENT PLACEMENT  12/29/12   STEMI with promus DES to LAD   CORONARY ANGIOPLASTY WITH STENT PLACEMENT  01/05/13   STEMI with thrombosis in stent to LAD   ESOPHAGOGASTRODUODENOSCOPY (EGD) WITH PROPOFOL N/A 08/20/2022   Procedure: ESOPHAGOGASTRODUODENOSCOPY (EGD) WITH PROPOFOL;  Surgeon: Vida Rigger, MD;  Location: [facility] ENDOSCOPY;  Service: Gastroenterology;  Laterality: N/A;   FIDUCIAL MARKER PLACEMENT  12/28/2021   Procedure: FIDUCIAL MARKER PLACEMENT;  Surgeon: Leslye Peer, MD;  Location: [facility] ENDOSCOPY;  Service: Pulmonary;;   LAPAROSCOPIC PARTIAL COLECTOMY Right 08/23/2022   Procedure: LAPAROSCOPIC CONVERTED TO OPEN HEMICOLECTOMY;  Surgeon: Axel Filler, MD;  Location: [facility] OR;  Service: General;  Laterality: Right;   LEFT HEART CATH N/A 12/29/2012   Procedure: LEFT HEART CATH;  Surgeon: Corky Crafts, MD;  Location: [facility] CATH LAB;  Service: Cardiovascular;  Laterality: N/A;   LEFT HEART CATHETERIZATION WITH CORONARY ANGIOGRAM N/A 01/05/2013   Procedure: LEFT HEART CATHETERIZATION WITH CORONARY ANGIOGRAM;  Surgeon: Micheline Chapman, MD;  Location: [facility] CATH LAB;  Service: Cardiovascular;  Laterality: N/A;   LEFT HEART CATHETERIZATION WITH CORONARY ANGIOGRAM N/A 04/22/2013   Procedure: LEFT HEART CATHETERIZATION WITH CORONARY ANGIOGRAM;  Surgeon: Lennette Bihari, MD;  Location: [facility] CATH LAB;  Service: Cardiovascular;  Laterality: N/A;   PARTIAL NEPHRECTOMY Left 1975   PATIENT ONLY HAS ONE KIDNEY   PERCUTANEOUS CORONARY STENT INTERVENTION (PCI-S)  12/29/2012   Procedure: PERCUTANEOUS CORONARY STENT INTERVENTION (PCI-S);  Surgeon: Corky Crafts, MD;  Location: [facility] CATH LAB;  Service: Cardiovascular;;   SUBMUCOSAL TATTOO INJECTION  08/20/2022   Procedure: SUBMUCOSAL TATTOO INJECTION;  Surgeon: Vida Rigger, MD;  Location: [facility] ENDOSCOPY;  Service: Gastroenterology;;   TEE WITHOUT CARDIOVERSION N/A 05/10/2022   Procedure: TRANSESOPHAGEAL ECHOCARDIOGRAM (TEE);  Surgeon: Sande Rives, MD;  Location: [facility] ENDOSCOPY;  Service: Cardiovascular;  Laterality: N/A;   TEE WITHOUT CARDIOVERSION N/A 05/28/2022   Procedure: TRANSESOPHAGEAL ECHOCARDIOGRAM (TEE);  Surgeon: Wendall Stade, MD;  Location: [facility] ENDOSCOPY;  Service: Cardiovascular;  Laterality: N/A;   Social History:  reports that he quit smoking about 23 years ago. His smoking use included cigarettes. He started smoking about 48 years ago. He has a 50 pack-year smoking history. His smokeless tobacco use includes chew. He reports that he does not currently use alcohol. He reports current drug use. Drug: Marijuana.  Allergies  Allergen Reactions   Levitra [Vardenafil] Other (See Comments)    Blurred vision   Aspirin Hives   Lipitor [Atorvastatin] Other (See Comments)    Muscle cramps; patient states he does not take medication at home    Family History  Problem Relation Age of Onset   Alzheimer's disease Mother    CAD Father     Prior to Admission medications   Medication Sig Start  Date End Date Taking? Authorizing Provider  acetaminophen (TYLENOL) 500 MG tablet Take 2 tablets (1,000 mg total) by mouth every 6 (six) hours. 08/28/22   Swayze, Ava, DO  amiodarone (PACERONE) 200 MG tablet Take 1/2 tablet (100 mg total) by mouth daily. 08/29/22   Swayze, Ava, DO  buPROPion (WELLBUTRIN XL) 150 MG 24 hr tablet Take 1 tablet (150 mg total) by mouth daily. 05/24/22   Elgergawy, Leana Roe, MD  empagliflozin (JARDIANCE) 10 MG TABS tablet Take 1 tablet (10 mg total) by mouth daily. 06/18/22   Marjie Skiff E, PA-C  ferrous sulfate 325 (65 FE) MG EC tablet Take 1 tablet (325 mg total) by mouth 2 (two) times daily. 07/31/22 09/10/22  Jerald Kief, MD  fluticasone (FLONASE) 50 MCG/ACT nasal spray Place 1 spray into both nostrils daily as needed for  allergies. 12/24/21   [provider]  furosemide (LASIX) 40 MG tablet Take 1 tablet (40 mg total) by mouth 2 (two) times daily. 08/28/22   Swayze, Ava, DO  lactose free nutrition (BOOST PLUS) LIQD Take 237 mLs by mouth 2 (two) times daily between meals. Patient not taking: Reported on 09/10/2022 08/29/22   Swayze, Ava, DO  methocarbamol (ROBAXIN) 500 MG tablet Take 1 tablet (500 mg total) by mouth every 8 (eight) hours as needed for muscle spasms. 08/28/22   Swayze, Ava, DO  Multiple Vitamin (MULTIVITAMIN WITH MINERALS) TABS tablet Take 1 tablet by mouth daily. 08/29/22   Swayze, Ava, DO  ondansetron (ZOFRAN) 4 MG tablet Take 1 tablet (4 mg total) by mouth every 8 (eight) hours as needed for nausea or vomiting. 09/13/22 09/13/23  Rai, Ripudeep K, MD  oxyCODONE (OXY IR/ROXICODONE) 5 MG immediate release tablet Take 1 tablet (5 mg total) by mouth every 6 (six) hours as needed for moderate pain. 08/28/22   Swayze, Ava, DO  pantoprazole (PROTONIX) 40 MG tablet Take 1 tablet (40 mg total) by mouth daily. 05/11/22   Pokhrel, Rebekah Chester[place], MD  PARoxetine (PAXIL) 30 MG tablet Take 30 mg by mouth daily.    [provider]  polyethylene glycol powder (GLYCOLAX/MIRALAX) 17 GM/SCOOP powder Take 17g (1 capful) as directed by mouth daily as needed for mild constipation. 08/28/22   Swayze, Ava, DO  potassium chloride SA (KLOR-CON M) 20 MEQ tablet Take 2 tablets (40 mEq total) by mouth daily. 08/28/22   Swayze, Ava, DO  pregabalin (LYRICA) 150 MG capsule Take 1 capsule (150 mg total) by mouth 2 (two) times daily. 05/24/22   Elgergawy, Leana Roe, MD  QUEtiapine (SEROQUEL) 25 MG tablet Take 1 tablet (25 mg total) by mouth at bedtime. 05/10/22   Pokhrel, Rebekah Chesterfield, MD  Rivaroxaban (XARELTO) 15 MG TABS tablet Take 1 tablet (15 mg total) by mouth daily with supper. 08/28/22   Swayze, Ava, DO  rosuvastatin (CRESTOR) 20 MG tablet Take 1 tablet (20 mg total) by mouth daily. 06/18/22   Marjie Skiff E, PA-C  sacubitril-valsartan  (ENTRESTO) 24-26 MG Take 1 tablet by mouth 2 (two) times daily. 06/18/22   Corrin Parker, PA-C  spironolactone (ALDACTONE) 25 MG tablet Take 1/2 tablet (12.5 mg total) by mouth daily. 06/18/22   Corrin Parker, PA-C    Physical Exam: Vitals:   09/27/22 1515 09/27/22 1530 09/27/22 1545 09/27/22 1600  BP:      Pulse: (!) 52 (!) 57 (!) 59 (!) 58  Resp: 15 16 13 16   Temp:      TempSrc:      SpO2: 97% 95%  97% 95%  Weight:      Height:       General:  Appears calm and comfortable and is in NAD, frail, chronically ill-appearing Eyes:  PERRL, EOMI, normal lids, iris ENT:  grossly normal hearing, lips & tongue, mildly dry mm; poor dentition Neck:  no LAD, masses or thyromegaly Cardiovascular:  RRR, no m/r/g. No LE edema.  Respiratory:   CTA bilaterally with no wheezes/rales/rhonchi.  Normal respiratory effort. Abdomen:  soft, NT but "it feels like you are pushing something out", ND, mildly surgical scar appears to be healing appropriately  Skin:  no rash or induration seen on limited exam Musculoskeletal:  grossly normal tone BUE/BLE, good ROM, no bony abnormality Psychiatric:  blunted mood and affect, speech fluent and appropriate, AOx3 Neurologic:  CN 2-12 grossly intact, moves all extremities in coordinated fashion    Radiological Exams on Admission: Independently reviewed - see discussion in A/P where applicable  CT ABDOMEN PELVIS WO CONTRAST  Result Date: 09/27/2022 CLINICAL DATA:  Nonlocalized abdominal pain. EXAM: CT ABDOMEN AND PELVIS WITHOUT CONTRAST TECHNIQUE: Multidetector CT imaging of the abdomen and pelvis was performed following the standard protocol without IV contrast. RADIATION DOSE REDUCTION: This exam was performed according to the departmental dose-optimization program which includes automated exposure control, adjustment of the mA and/or kV according to patient size and/or use of iterative reconstruction technique. COMPARISON:  09/10/2022 FINDINGS: Lower chest:  Unremarkable. Hepatobiliary: No suspicious focal abnormality in the liver on this study without intravenous contrast. Gallbladder is surgically absent. No intrahepatic or extrahepatic biliary dilation. Pancreas: No focal mass lesion. No dilatation of the main duct. No intraparenchymal cyst. No peripancreatic edema. Spleen: No splenomegaly. No suspicious focal mass lesion. Adrenals/Urinary Tract: No adrenal nodule or mass. Small cyst lower pole right kidney is stable. No followup imaging is recommended. Right ureter unremarkable. Left nephrectomy. The urinary bladder appears normal for the degree of distention. Stomach/Bowel: Tiny hiatal hernia. Stomach otherwise unremarkable. Duodenum is normally positioned as is the ligament of Treitz. Multiple duodenal diverticuli noted. No small bowel wall thickening. No small bowel dilatation. Status post right hemicolectomy. Stranding in the fat around the ileocolic anastomosis is similar to prior. Diverticular disease is noted in the left colon. There is some subtle pericolonic edema along the distal descending colon (images 41-47 of series 2) that is new in the interval suggesting diverticulitis. Vascular/Lymphatic: There is moderate atherosclerotic calcification of the abdominal aorta without aneurysm. There is no gastrohepatic or hepatoduodenal ligament lymphadenopathy. No retroperitoneal or mesenteric lymphadenopathy. No pelvic sidewall lymphadenopathy. Reproductive: The prostate gland and seminal vesicles are unremarkable. Other: No intraperitoneal free fluid. Musculoskeletal: No worrisome lytic or sclerotic osseous abnormality. Stable appearance L1 superior endplate compression deformity. IMPRESSION: 1. Left colonic diverticulosis with some subtle pericolonic edema along the distal descending colon, new in the interval suggesting diverticulitis. No evidence for perforation or abscess. 2. Status post right hemicolectomy with stable appearance of stranding in the fat  around the ileocolic anastomosis. Given the relative stability of this finding, it may reflect scarring. 3. Status post left nephrectomy. 4. Tiny hiatal hernia. 5.  Aortic Atherosclerosis (ICD10-I70.0). Electronically Signed   By: Kennith Center M.D.   On: 09/27/2022 15:41    EKG: Independently reviewed.  NSR with rate 64; nonspecific ST changes with no evidence of acute ischemia   Labs on Admission: I have personally reviewed the available labs and imaging studies at the time of the admission.  Pertinent labs:    K+ 3.2 Glucose 119 BN 32/Creatinine  3.34/GFR 19; 9/1.31/59 on 7/18 WBC 10.6   Assessment and Plan: Principal Problem:   Acute kidney injury superimposed on chronic kidney disease (HCC) Active Problems:   Dyslipidemia   Essential hypertension   Coronary artery disease involving native coronary artery of native heart without angina pectoris   OSA on CPAP   Persistent atrial fibrillation (HCC)   Nausea vomiting and diarrhea   Depression with anxiety   Adenocarcinoma, colon (HCC)    AKI on stage 3a CKD -Prior creatinine 1.31, GFR 59  -Today's creatinine is 3.34, GFR 19 -Likely due to prerenal failure secondary to dehydration and continuation of Entresto, diuretics. -Also with solitary kidney -Hold diuretics and Entresto for now -IVF -Follow up renal function by BMP -Avoid ACEI and NSAIDs   Recurrent nausea -Recent history of right hemicolectomy with ileocolonic anastomosis on 08/23/2022 for right colon mass -Returned in mid-July for possible colitis, treated with Augmentin -Transient improvement around discharge but symptoms recurred at home -While he is having less vomiting and diarrhea, he is having persistent nausea with inability to take PO  -CT with possible diverticulitis -Will treat with Ceftriaxone/Flagyl -GI consulted, Dr. Marca Ancona to see   Right colon mass/adeno CA - Recent history of right hemicolectomy with ileocolonic anastomosis by Dr. Derrell Lolling on  08/23/2022 for right colon mass -CEA 6.6, final path with invasive well to moderately differentiated adeno CA -Outpatient follow-up with oncology scheduled for 09/27/22 with Dr Myrle Sheng but this had to be rescheduled since he came to the ER today   Coronary artery disease -Currently no acute cardiac symptoms -Continue rosuvastatin   OSA on CPAP -Will order CPAP   Chronic atrial flutter (HCC) -Rate controlled with amiodarone -Continue Xarelto for now   Chronic heart failure with preserved ejection fraction (HFpEF) (HCC), hypertension -08/17/22 echo with low-normal EF 50% -Appears to be dry at this time -Hold Entresto, Lasix, Jardiance  Mood d/o -Continue Bupropion, paroxetine, quetiapine   Advance Care Planning:   Code Status: Full Code - Code status was discussed with the patient at the time of admission.  The patient would want to receive full resuscitative measures at this time.   Consults: GI; TOC team; nutrition; PT/OT  DVT Prophylaxis: Xarelto  Family Communication: None present; I spoke with his daughter by telephone at the time of admission  Severity of Illness: The appropriate patient status for this patient is INPATIENT. Inpatient status is judged to be reasonable and necessary in order to provide the required intensity of service to ensure the patient's safety. The patient's presenting symptoms, physical exam findings, and initial radiographic and laboratory data in the context of their chronic comorbidities is felt to place them at high risk for further clinical deterioration. Furthermore, it is not anticipated that the patient will be medically stable for discharge from the hospital within 2 midnights of admission.   * I certify that at the point of admission it is my clinical judgment that the patient will require inpatient hospital care spanning beyond 2 midnights from the point of admission due to high intensity of service, high risk for further deterioration and high  frequency of surveillance required.*  Author: Jonah Blue, MD 09/27/2022 5:09 PM  For on call review [email or web address].

## 2022-09-27 NOTE — ED Triage Notes (Signed)
Pt arrives via POV. Pt reports abdominal pain, nausea, vomiting, and diarrhea since being discharged from the hospital last week. Pt reports intermittent fevers. Pt is AxOx4. Hx of colon cancer.

## 2022-09-28 DIAGNOSIS — N189 Chronic kidney disease, unspecified: Secondary | ICD-10-CM | POA: Diagnosis not present

## 2022-09-28 DIAGNOSIS — N179 Acute kidney failure, unspecified: Secondary | ICD-10-CM | POA: Diagnosis not present

## 2022-09-28 DIAGNOSIS — E43 Unspecified severe protein-calorie malnutrition: Secondary | ICD-10-CM | POA: Insufficient documentation

## 2022-09-28 LAB — GLUCOSE, CAPILLARY: Glucose-Capillary: 72 mg/dL (ref 70–99)

## 2022-09-28 MED ORDER — PROSOURCE PLUS PO LIQD
30.0000 mL | Freq: Three times a day (TID) | ORAL | Status: DC
  Administered 2022-09-28 – 2022-09-30 (×7): 30 mL via ORAL
  Filled 2022-09-28 (×6): qty 30

## 2022-09-28 NOTE — Consult Note (Signed)
[facility] Consult  Referring Provider: Dr. Binnie Rail hospitalist Primary Care Physician:  [name], MD Primary Gastroenterologist: Dr. Ewing Schlein  Reason for Consultation: Diverticulitis  HPI: Brendan Butler is a 72 y.o. male recently diagnosed with invasive well to moderately differentiated adenocarcinoma of hepatic flexure on 08/16/2022 for which he underwent right hemicolectomy on 08/23/2022 which showed invasive moderately differentiated adenocarcinoma 2.5 cm invading pericolonic soft tissue with negative lymphovascular or perineural invasion, presents to the ER with persistent left lower quadrant abdominal pain associated with nausea and fever at home. Patient reports 40 pound weight loss in the last 1 month. His bowel movements are irregular and may have a bowel movement every 3 days but has not noticed any blood in stool or black stools. Patient denies acid reflux, heartburn, difficulty swallowing, pain on swallowing, early satiety but has lack of appetite and complains of generalized deconditioning. Patient states when he has a bowel movement stools are usually loose.  Past Medical History:  Diagnosis Date   Basal ganglia infarction (HCC)    Bradycardia    CAD (coronary artery disease)    Chronic HFrEF (heart failure with reduced ejection fraction) (HCC)    Chronic kidney disease, stage 3 (HCC)    Colon cancer (HCC) 08/26/2022   Erectile dysfunction    Hyperlipidemia LDL goal <70    Hypertension    OSA (obstructive sleep apnea)    No CPAP use overnight   PAF (paroxysmal atrial fibrillation) (HCC)    Paroxysmal atrial flutter (HCC)    Premature atrial contractions    Pulmonary nodules     Past Surgical History:  Procedure Laterality Date   BACK SURGERY  2007   Decompression lumbar laminectomy and micro discectomy, L4-5   BIOPSY  08/20/2022   Procedure: BIOPSY;  Surgeon: Vida Rigger, MD;  Location: [facility] ENDOSCOPY;  Service: Gastroenterology;;   BRONCHIAL  BIOPSY  12/28/2021   Procedure: BRONCHIAL BIOPSIES;  Surgeon: Leslye Peer, MD;  Location: [facility] ENDOSCOPY;  Service: Pulmonary;;   BRONCHIAL BRUSHINGS  12/28/2021   Procedure: BRONCHIAL BRUSHINGS;  Surgeon: Leslye Peer, MD;  Location: [facility] ENDOSCOPY;  Service: Pulmonary;;   BRONCHIAL NEEDLE ASPIRATION BIOPSY  12/28/2021   Procedure: BRONCHIAL NEEDLE ASPIRATION BIOPSIES;  Surgeon: Leslye Peer, MD;  Location: [facility] ENDOSCOPY;  Service: Pulmonary;;   BRONCHIAL WASHINGS  12/28/2021   Procedure: BRONCHIAL WASHINGS;  Surgeon: Leslye Peer, MD;  Location: [facility] ENDOSCOPY;  Service: Pulmonary;;   CARDIAC CATHETERIZATION  2007   patent stent to LAD and normal Cors   CARDIOVERSION N/A 05/10/2022   Procedure: CARDIOVERSION;  Surgeon: Sande Rives, MD;  Location: [facility] ENDOSCOPY;  Service: Cardiovascular;  Laterality: N/A;   CARDIOVERSION N/A 05/28/2022   Procedure: CARDIOVERSION;  Surgeon: Wendall Stade, MD;  Location: [facility] ENDOSCOPY;  Service: Cardiovascular;  Laterality: N/A;   CHOLECYSTECTOMY N/A 12/26/2021   Procedure: LAPAROSCOPIC CHOLECYSTECTOMY, LAPAROSCOPIC LYSIS OF ADHESIONS GREATER THAN 1 HOUR;  Surgeon: [name], MD;  Location: [facility] OR;  Service: General;  Laterality: N/A;   COLONOSCOPY WITH PROPOFOL N/A 08/20/2022   Procedure: COLONOSCOPY WITH PROPOFOL;  Surgeon: Vida Rigger, MD;  Location: [facility] ENDOSCOPY;  Service: Gastroenterology;  Laterality: N/A;   CORONARY ANGIOPLASTY WITH STENT PLACEMENT  2001   to LAD   CORONARY ANGIOPLASTY WITH STENT PLACEMENT  12/29/12   STEMI with promus DES to LAD   CORONARY ANGIOPLASTY WITH STENT PLACEMENT  01/05/13   STEMI with thrombosis in stent to LAD   ESOPHAGOGASTRODUODENOSCOPY (EGD) WITH PROPOFOL N/A 08/20/2022  Procedure: ESOPHAGOGASTRODUODENOSCOPY (EGD) WITH PROPOFOL;  Surgeon: Vida Rigger, MD;  Location: [facility] ENDOSCOPY;  Service: Gastroenterology;  Laterality: N/A;   FIDUCIAL MARKER PLACEMENT  12/28/2021   Procedure: FIDUCIAL MARKER PLACEMENT;   Surgeon: Leslye Peer, MD;  Location: [facility] ENDOSCOPY;  Service: Pulmonary;;   LAPAROSCOPIC PARTIAL COLECTOMY Right 08/23/2022   Procedure: LAPAROSCOPIC CONVERTED TO OPEN HEMICOLECTOMY;  Surgeon: Axel Filler, MD;  Location: [facility] OR;  Service: General;  Laterality: Right;   LEFT HEART CATH N/A 12/29/2012   Procedure: LEFT HEART CATH;  Surgeon: Corky Crafts, MD;  Location: [facility] CATH LAB;  Service: Cardiovascular;  Laterality: N/A;   LEFT HEART CATHETERIZATION WITH CORONARY ANGIOGRAM N/A 01/05/2013   Procedure: LEFT HEART CATHETERIZATION WITH CORONARY ANGIOGRAM;  Surgeon: Micheline Chapman, MD;  Location: [facility] CATH LAB;  Service: Cardiovascular;  Laterality: N/A;   LEFT HEART CATHETERIZATION WITH CORONARY ANGIOGRAM N/A 04/22/2013   Procedure: LEFT HEART CATHETERIZATION WITH CORONARY ANGIOGRAM;  Surgeon: Lennette Bihari, MD;  Location: [facility] CATH LAB;  Service: Cardiovascular;  Laterality: N/A;   PARTIAL NEPHRECTOMY Left 1975   PATIENT ONLY HAS ONE KIDNEY   PERCUTANEOUS CORONARY STENT INTERVENTION (PCI-S)  12/29/2012   Procedure: PERCUTANEOUS CORONARY STENT INTERVENTION (PCI-S);  Surgeon: Corky Crafts, MD;  Location: [facility] CATH LAB;  Service: Cardiovascular;;   SUBMUCOSAL TATTOO INJECTION  08/20/2022   Procedure: SUBMUCOSAL TATTOO INJECTION;  Surgeon: Vida Rigger, MD;  Location: [facility] ENDOSCOPY;  Service: Gastroenterology;;   TEE WITHOUT CARDIOVERSION N/A 05/10/2022   Procedure: TRANSESOPHAGEAL ECHOCARDIOGRAM (TEE);  Surgeon: Sande Rives, MD;  Location: [facility] ENDOSCOPY;  Service: Cardiovascular;  Laterality: N/A;   TEE WITHOUT CARDIOVERSION N/A 05/28/2022   Procedure: TRANSESOPHAGEAL ECHOCARDIOGRAM (TEE);  Surgeon: Wendall Stade, MD;  Location: [facility] ENDOSCOPY;  Service: Cardiovascular;  Laterality: N/A;    Prior to Admission medications   Medication Sig Start Date End Date Taking? Authorizing Provider  acetaminophen (TYLENOL) 500 MG tablet Take 2 tablets (1,000 mg total) by mouth every 6 (six)  hours. 08/28/22  Yes Swayze, Ava, DO  amiodarone (PACERONE) 200 MG tablet Take 1/2 tablet (100 mg total) by mouth daily. 08/29/22  Yes Swayze, Ava, DO  buPROPion (WELLBUTRIN XL) 150 MG 24 hr tablet Take 1 tablet (150 mg total) by mouth daily. 05/24/22  Yes Elgergawy, Leana Roe, MD  empagliflozin (JARDIANCE) 10 MG TABS tablet Take 1 tablet (10 mg total) by mouth daily. 06/18/22  Yes Marjie Skiff E, PA-C  ferrous sulfate 325 (65 FE) MG EC tablet Take 1 tablet (325 mg total) by mouth 2 (two) times daily. 07/31/22 09/27/22 Yes Jerald Kief, MD  fluticasone [facility]) 50 MCG/ACT nasal spray Place 1 spray into both nostrils daily as needed for allergies. 12/24/21  Yes [provider]  furosemide (LASIX) 40 MG tablet Take 1 tablet (40 mg total) by mouth 2 (two) times daily. 08/28/22  Yes Swayze, Ava, DO  lactose free nutrition (BOOST PLUS) LIQD Take 237 mLs by mouth 2 (two) times daily between meals. 08/29/22  Yes Swayze, Ava, DO  methocarbamol (ROBAXIN) 500 MG tablet Take 1 tablet (500 mg total) by mouth every 8 (eight) hours as needed for muscle spasms. 08/28/22  Yes Swayze, Ava, DO  Multiple Vitamin (MULTIVITAMIN WITH MINERALS) TABS tablet Take 1 tablet by mouth daily. 08/29/22  Yes Swayze, Ava, DO  ondansetron (ZOFRAN) 4 MG tablet Take 1 tablet (4 mg total) by mouth every 8 (eight) hours as needed for nausea or vomiting. 09/13/22 09/13/23 Yes Rai, Delene Ruffini, MD  oxyCODONE (  OXY IR/ROXICODONE) 5 MG immediate release tablet Take 1 tablet (5 mg total) by mouth every 6 (six) hours as needed for moderate pain. 08/28/22  Yes Swayze, Ava, DO  pantoprazole (PROTONIX) 40 MG tablet Take 1 tablet (40 mg total) by mouth daily. 05/11/22  Yes Pokhrel, Laxman, MD  PARoxetine (PAXIL) 30 MG tablet Take 30 mg by mouth daily.   Yes [provider]  potassium chloride SA (KLOR-CON M) 20 MEQ tablet Take 2 tablets (40 mEq total) by mouth daily. 08/28/22  Yes Swayze, Ava, DO  pregabalin (LYRICA) 150 MG capsule Take 1 capsule  (150 mg total) by mouth 2 (two) times daily. 05/24/22  Yes Elgergawy, Leana Roe, MD  QUEtiapine (SEROQUEL) 25 MG tablet Take 1 tablet (25 mg total) by mouth at bedtime. 05/10/22  Yes Pokhrel, Rebekah Chesterfield, MD  Rivaroxaban (XARELTO) 15 MG TABS tablet Take 1 tablet (15 mg total) by mouth daily with supper. 08/28/22  Yes Swayze, Ava, DO  rosuvastatin (CRESTOR) 20 MG tablet Take 1 tablet (20 mg total) by mouth daily. 06/18/22  Yes Goodrich, Callie E, PA-C  sacubitril-valsartan (ENTRESTO) 24-26 MG Take 1 tablet by mouth 2 (two) times daily. 06/18/22  Yes Marjie Skiff E, PA-C  spironolactone (ALDACTONE) 25 MG tablet Take 1/2 tablet (12.5 mg total) by mouth daily. 06/18/22  Yes Marjie Skiff E, PA-C    Current Facility-Administered Medications  Medication Dose Route Frequency Provider Last Rate Last Admin   0.9 % NaCl with KCl 40 mEq / L  infusion   Intravenous Continuous Jonah Blue, MD 100 mL/hr at 09/28/22 0702 New Bag at 09/28/22 0702   acetaminophen (TYLENOL) tablet 650 mg  650 mg Oral Q6H PRN Jonah Blue, MD       Or   acetaminophen (TYLENOL) suppository 650 mg  650 mg Rectal Q6H PRN Jonah Blue, MD       amiodarone (PACERONE) tablet 100 mg  100 mg Oral Daily Jonah Blue, MD       bisacodyl (DULCOLAX) EC tablet 5 mg  5 mg Oral Daily PRN Jonah Blue, MD       buPROPion (WELLBUTRIN XL) 24 hr tablet 150 mg  150 mg Oral Daily Jonah Blue, MD       cefTRIAXone (ROCEPHIN) 2 g in sodium chloride 0.9 % 100 mL IVPB  2 g Intravenous Q24H Jonah Blue, MD 200 mL/hr at 09/27/22 1814 2 g at 09/27/22 1814   hydrALAZINE (APRESOLINE) injection 5 mg  5 mg Intravenous Q4H PRN Jonah Blue, MD       metroNIDAZOLE (FLAGYL) IVPB 500 mg  500 mg Intravenous Steva Colder, MD 100 mL/hr at 09/28/22 0555 500 mg at 09/28/22 0555   morphine (PF) 2 MG/ML injection 2 mg  2 mg Intravenous Q2H PRN Jonah Blue, MD       ondansetron [facility]) tablet 4 mg  4 mg Oral Q6H PRN Jonah Blue, MD        Or   ondansetron [facility]) injection 4 mg  4 mg Intravenous Q6H PRN Jonah Blue, MD       oxyCODONE (Oxy IR/ROXICODONE) immediate release tablet 5 mg  5 mg Oral Q6H PRN Jonah Blue, MD       pantoprazole (PROTONIX) EC tablet 40 mg  40 mg Oral Daily Jonah Blue, MD       PARoxetine (PAXIL) tablet 30 mg  30 mg Oral Daily Jonah Blue, MD       polyethylene glycol (MIRALAX / GLYCOLAX) packet 17 g  17 g Oral  Daily PRN Jonah Blue, MD       pregabalin (LYRICA) capsule 150 mg  150 mg Oral BID Jonah Blue, MD       QUEtiapine (SEROQUEL) tablet 25 mg  25 mg Oral Noemi Chapel, MD   25 mg at 09/27/22 2325   Rivaroxaban (XARELTO) tablet 15 mg  15 mg Oral Q supper Jonah Blue, MD   15 mg at 09/27/22 2325   rosuvastatin (CRESTOR) tablet 20 mg  20 mg Oral Daily Jonah Blue, MD       sodium chloride flush (NS) 0.9 % injection 3 mL  3 mL Intravenous Q12H Jonah Blue, MD        Allergies as of 09/27/2022 - Review Complete 09/27/2022  Allergen Reaction Noted   Levitra [vardenafil] Other (See Comments) 01/12/2013   Aspirin Hives 09/10/2007   Lipitor [atorvastatin] Other (See Comments) 01/05/2013    Family History  Problem Relation Age of Onset   Alzheimer's disease Mother    CAD Father     Social History   Socioeconomic History   Marital status: Divorced    Spouse name: Not on file   Number of children: 4   Years of education: Not on file   Highest education level: High school graduate  Occupational History    Comment: [employer]  Tobacco Use   Smoking status: Former    Current packs/day: 0.00    Average packs/day: 2.0 packs/day for 25.0 years (50.0 ttl pk-yrs)    Types: Cigarettes    Start date: 89    Quit date: 2001    Years since quitting: 23.6   Smokeless tobacco: Current    Types: Chew   Tobacco comments:    Currently Dip Tobacco     Former smoker 06/14/22  Vaping Use   Vaping status: Never Used  Substance and Sexual Activity   Alcohol use:  Not Currently    Comment: 2001 stopped drinking   Drug use: Yes    Types: Marijuana    Comment: occasional   Sexual activity: Not on file  Other Topics Concern   Not on file  Social History Narrative   Not on file   Social Determinants of Health   Financial Resource Strain: Low Risk  (08/20/2022)   Overall Financial Resource Strain (CARDIA)    Difficulty of Paying Living Expenses: Not very hard  Food Insecurity: No Food Insecurity (09/27/2022)   Hunger Vital Sign    Worried About Running Out of Food in the Last Year: Never true    Ran Out of Food in the Last Year: Never true  Transportation Needs: No Transportation Needs (09/27/2022)   PRAPARE - Administrator, Civil Service (Medical): No    Lack of Transportation (Non-Medical): No  Physical Activity: Not on file  Stress: Not on file  Social Connections: Not on file  Intimate Partner Violence: Not At Risk (09/27/2022)   Humiliation, Afraid, Rape, and Kick questionnaire    Fear of Current or Ex-Partner: No    Emotionally Abused: No    Physically Abused: No    Sexually Abused: No    Review of Systems: As per HPI Physical Exam: Vital signs in last 24 hours: Temp:  [97.9 F (36.6 C)-98.9 F (37.2 C)] 98.9 F (37.2 C) (08/02 0435) Pulse Rate:  [52-65] 52 (08/02 0435) Resp:  [12-17] 16 (08/02 0435) BP: (142-187)/(47-80) 142/47 (08/02 0435) SpO2:  [95 %-100 %] 100 % (08/02 0435) Weight:  [85.5 kg-86.2 kg] 85.5 kg (08/01 1801)  Last BM Date : 09/25/22  General:   Alert,  Well-developed, well-nourished, pleasant and cooperative in NAD Head:  Normocephalic and atraumatic. Eyes:  Sclera clear, no icterus.   Conjunctiva pink. Ears:  Normal auditory acuity. Nose:  No deformity, discharge,  or lesions. Mouth:  No deformity or lesions.  Oropharynx pink & moist. Neck:  Supple; no masses or thyromegaly. Lungs:  Clear throughout to auscultation.   No wheezes, crackles, or rhonchi. No acute distress. Heart:  Regular rate and  rhythm; no murmurs, clicks, rubs,  or gallops. Extremities:  Without clubbing or edema. Neurologic:  Alert and  oriented x4;  grossly normal neurologically. Skin:  Intact without significant lesions or rashes. Psych:  Alert and cooperative. Normal mood and affect. Abdomen:  Soft, well-healed midline surgical scar, nontender        Lab Results: Recent Labs    09/27/22 1024 09/28/22 0422  WBC 10.6* 8.8  HGB 12.7* 11.0*  HCT 40.3 35.3*  PLT 228 199   BMET Recent Labs    09/27/22 1024 09/28/22 0422  NA 134* 139  K 3.2* 3.7  CL 98 103  CO2 22 23  GLUCOSE 119* 72  BUN 32* 23  CREATININE 3.34* 2.27*  CALCIUM 9.3 8.6*   LFT Recent Labs    09/27/22 1024  PROT 7.6  ALBUMIN 3.9  AST 18  ALT 18  ALKPHOS 85  BILITOT 1.0   PT/INR No results for input(s): "LABPROT", "INR" in the last 72 hours.  Studies/Results: CT ABDOMEN PELVIS WO CONTRAST  Result Date: 09/27/2022 CLINICAL DATA:  Nonlocalized abdominal pain. EXAM: CT ABDOMEN AND PELVIS WITHOUT CONTRAST TECHNIQUE: Multidetector CT imaging of the abdomen and pelvis was performed following the standard protocol without IV contrast. RADIATION DOSE REDUCTION: This exam was performed according to the departmental dose-optimization program which includes automated exposure control, adjustment of the mA and/or kV according to patient size and/or use of iterative reconstruction technique. COMPARISON:  09/10/2022 FINDINGS: Lower chest: Unremarkable. Hepatobiliary: No suspicious focal abnormality in the liver on this study without intravenous contrast. Gallbladder is surgically absent. No intrahepatic or extrahepatic biliary dilation. Pancreas: No focal mass lesion. No dilatation of the main duct. No intraparenchymal cyst. No peripancreatic edema. Spleen: No splenomegaly. No suspicious focal mass lesion. Adrenals/Urinary Tract: No adrenal nodule or mass. Small cyst lower pole right kidney is stable. No followup imaging is recommended. Right  ureter unremarkable. Left nephrectomy. The urinary bladder appears normal for the degree of distention. Stomach/Bowel: Tiny hiatal hernia. Stomach otherwise unremarkable. Duodenum is normally positioned as is the ligament of Treitz. Multiple duodenal diverticuli noted. No small bowel wall thickening. No small bowel dilatation. Status post right hemicolectomy. Stranding in the fat around the ileocolic anastomosis is similar to prior. Diverticular disease is noted in the left colon. There is some subtle pericolonic edema along the distal descending colon (images 41-47 of series 2) that is new in the interval suggesting diverticulitis. Vascular/Lymphatic: There is moderate atherosclerotic calcification of the abdominal aorta without aneurysm. There is no gastrohepatic or hepatoduodenal ligament lymphadenopathy. No retroperitoneal or mesenteric lymphadenopathy. No pelvic sidewall lymphadenopathy. Reproductive: The prostate gland and seminal vesicles are unremarkable. Other: No intraperitoneal free fluid. Musculoskeletal: No worrisome lytic or sclerotic osseous abnormality. Stable appearance L1 superior endplate compression deformity. IMPRESSION: 1. Left colonic diverticulosis with some subtle pericolonic edema along the distal descending colon, new in the interval suggesting diverticulitis. No evidence for perforation or abscess. 2. Status post right hemicolectomy with stable appearance of stranding in the fat  around the ileocolic anastomosis. Given the relative stability of this finding, it may reflect scarring. 3. Status post left nephrectomy. 4. Tiny hiatal hernia. 5.  Aortic Atherosclerosis (ICD10-I70.0). Electronically Signed   By: Kennith Center M.D.   On: 09/27/2022 15:41    Impression: Diverticulitis involving distal descending colon without evidence of perforation or abscess  Status post right hemicolectomy with fat stranding of ileocolonic anastomosis likely related to scarring  Adenocarcinoma of  ascending colon status post right hemicolectomy 6/24  Renal impairment, creatinine 2.27, GFR 30 Normocytic anemia, hemoglobin 11  Multiple comorbidities: Atrial fibrillation on Xarelto, heart failure, stage III chronic kidney disease, hypertension, dyslipidemia, obstructive sleep apnea  Plan: Patient has been started on IV ceftriaxone 2 g every 24 hours and IV Flagyl 500 mg every 12 hours along with normal saline with 40 mEq KCl at 100 cc an hour.  Will start him on clear liquid diet and advance to full liquids for lunch and dinner.  Hopefully diverticulitis can be treated with IV antibiotics, and patient can be discharged on oral antibiotics over the next few days. He missed his appointment with Dr. Myrle Sheng from oncology for recently diagnosed colon cancer, this will have to be rescheduled as an outpatient. Dr. Dulce Sellar to follow tomorrow.    LOS: 1 day   Kerin Salen, MD  09/28/2022, 8:13 AM

## 2022-09-28 NOTE — Plan of Care (Signed)
  Problem: Health Behavior/Discharge Planning: Goal: Ability to manage health-related needs will improve Outcome: Progressing   Problem: Clinical Measurements: Goal: Ability to maintain clinical measurements within normal limits will improve Outcome: Progressing   Problem: Safety: Goal: Ability to remain free from injury will improve Outcome: Progressing   

## 2022-09-28 NOTE — TOC Initial Note (Signed)
Transition of Care [facility]) - Initial/Assessment Note    Patient Details  Name: Brendan Butler MRN: [id number] Date of Birth: 08-31-1950  Transition of Care [facility]) CM/SW Contact:    [name], [name], RN Phone Number: 09/28/2022, 1:27 PM  Clinical Narrative:                  CM spoke with patient at bedside about needs for post hospital transition.  Presented to the ED with Abdominal Pain, N/V, Fever and Diarrhea. Patient was recently discharged from the hospital 09/10/22 for Sepsis and Colitis. Currently on IV abx. GI following for Diverticulitis.  From home alone. Has three supportive children that lives close by. Retired, independent with care and drive self prior to admission. Has all necessary DME's at home.  PCP is [name], MD and uses [facility] in [place].   Home Health recommended, patient declined stating he is a private person and does not want people in his home and can do exercises by himself, his children and family can assist when needed. CM offered the option of Outpatient therapy and patient also declined stating he does not want any therapy.   Daughter, [name] to transport at discharge.     CM will continue to follow as patient progresses with care towards discharge.      Expected Discharge Plan: Home w Home Health Services Barriers to Discharge: Continued Medical Work up   Patient Goals and CMS Choice Patient states their goals for this hospitalization and ongoing recovery are:: To return home CMS Medicare.gov Compare Post Acute Care list provided to:: Patient Choice offered to / list presented to : Parent      Expected Discharge Plan and Services   Discharge Planning Services: CM Consult Post Acute Care Choice: NA (Declined Home Health recommendation) Living arrangements for the past 2 months: Single Family Home                 DME Arranged: N/A DME Agency: NA       HH Arranged: Refused HH HH Agency: NA        Prior  Living Arrangements/Services Living arrangements for the past 2 months: Single Family Home Lives with:: Self Patient language and need for interpreter reviewed:: Yes Do you feel safe going back to the place where you live?: Yes      Need for Family Participation in Patient Care: Yes (Comment) Care giver support system in place?: Yes (comment) Current home services: DME Criminal Activity/Legal Involvement Pertinent to Current Situation/Hospitalization: No - Comment as needed  Activities of Daily Living Home Assistive Devices/Equipment: None ADL Screening (condition at time of admission) Patient's cognitive ability adequate to safely complete daily activities?: Yes Is the patient deaf or have difficulty hearing?: No Does the patient have difficulty seeing, even when wearing glasses/contacts?: No Does the patient have difficulty concentrating, remembering, or making decisions?: No Patient able to express need for assistance with ADLs?: Yes Does the patient have difficulty dressing or bathing?: No Independently performs ADLs?: Yes (appropriate for developmental age) Does the patient have difficulty walking or climbing stairs?: No Weakness of Legs: None Weakness of Arms/Hands: None  Permission Sought/Granted Permission sought to share information with : Case Manager, Family Supports Permission granted to share information with : Yes, Verbal Permission Granted              Emotional Assessment Appearance:: Appears stated age Attitude/Demeanor/Rapport: Engaged, Gracious Affect (typically observed): Accepting, Appropriate, Calm, Hopeful, Pleasant Orientation: : Oriented to Self, Oriented  to Place, Oriented to  Time, Oriented to Situation Alcohol / Substance Use: Not Applicable Psych Involvement: No (comment)  Admission diagnosis:  Diverticulitis [K57.92] AKI (acute kidney injury) (HCC) [N17.9] Patient Active Problem List   Diagnosis Date Noted   Acute kidney injury superimposed on  chronic kidney disease (HCC) 09/27/2022   Adenocarcinoma, colon (HCC) 09/27/2022   Colitis 09/11/2022   Nausea vomiting and diarrhea 09/10/2022   Depression with anxiety 09/10/2022   Chronic heart failure with preserved ejection fraction (HFpEF) (HCC) 09/10/2022   Persistent atrial fibrillation (HCC) 08/17/2022   Symptomatic anemia 08/17/2022   Hypercoagulable state due to typical atrial flutter (HCC) 06/14/2022   Falls frequently 05/23/2022   MDD (major depressive disorder), recurrent episode, mild (HCC) 05/10/2022   Delirium due to another medical condition 05/10/2022   Atrial flutter (HCC) 05/06/2022   Hypokalemia 05/06/2022   Pulmonary nodules 12/27/2021   Symptomatic bradycardia 12/25/2021   Preoperative cardiovascular examination    Intra-abdominal abscess (HCC) 01/17/2021   Lung nodule 01/17/2021   Aneurysm of ascending aorta (HCC) 01/17/2021   CKD stage 3b, GFR 30-44 ml/min (HCC) 01/17/2021   Weight loss 01/17/2021   Bronchiectasis (HCC) 01/17/2021   Chronic cholecystitis 01/16/2021   Traumatic hematoma of left popliteal region 01/28/2016   DJD (degenerative joint disease) of pelvis 01/28/2016   Atypical chest pain    Chest pain 01/27/2016   Unstable angina (HCC) 04/21/2013   Leukocytosis 04/21/2013   Subsequent ST elevation (STEMI) myocardial infarction of anterior wall within 4 weeks of initial infarction (HCC) 01/08/2013    Class: Hospitalized for   Illiteracy and low-level literacy 01/08/2013   Aspirin allergy- hives 01/06/2013   STEMI 12/29/12 Rx'd with LAD DES with early stent thrombosis, STEMI-PCI 01/05/14 01/05/2013   Coronary artery disease involving native coronary artery of native heart without angina pectoris 06/29/2007   Dyslipidemia 06/25/2007   Essential hypertension 06/25/2007   ALLERGIC RHINITIS 06/25/2007   OSA on CPAP 06/25/2007   PCP:  [name], MD Pharmacy:   [facility] [address] [place]  [address] Phone: [phone] Fax: [phone]     Social Determinants of Health (SDOH) Social History: SDOH Screenings   Food Insecurity: No Food Insecurity (09/27/2022)  Housing: Low Risk  (09/27/2022)  Transportation Needs: No Transportation Needs (09/27/2022)  Utilities: Not At Risk (09/27/2022)  Alcohol Screen: Low Risk  (08/20/2022)  Financial Resource Strain: Low Risk  (08/20/2022)  Tobacco Use: High Risk (09/27/2022)   SDOH Interventions: Transportation Interventions: Intervention Not Indicated, Inpatient TOC, Patient Resources (Friends/Family)   Readmission Risk Interventions    09/28/2022    1:19 PM  Readmission Risk Prevention Plan  Transportation Screening Complete  Medication Review (RN Care Manager) Referral to Pharmacy  PCP or Specialist appointment within 3-5 days of discharge Complete  HRI or Home Care Consult Complete  SW Recovery Care/Counseling Consult Complete  Palliative Care Screening Not Applicable  Skilled Nursing Facility Not Applicable

## 2022-09-28 NOTE — Progress Notes (Signed)
Brendan Butler  WUJ:[id number] DOB: [birth year] DOA: 09/27/2022 PCP: [name], MD    Brief Narrative:  72 year old with a history of recently diagnosed colon cancer status post hemicolectomy, atrial fibrillation on Xarelto, CVA, diastolic congestive heart failure, CKD stage III, HTN, HLD, and OSA who was hospitalized 7/15-7/18/2024 with nausea vomiting and concern for colitis following a recent hemicolectomy for colon cancer.  He returned to the ER 09/27/2022 for this admission with recurrent nausea and vomiting.  He had been unable to tolerate his oral medications or even liquid intake.   Goals of Care:   Code Status: Full Code   DVT prophylaxis: Rivaroxaban (XARELTO) tablet 15 mg   Interim Hx: Afebrile.  Vital signs stable.  No acute events recorded overnight.  Resting quietly at the time of my visit.  No evidence of uncontrolled pain or acute distress.  Assessment & Plan:  Acute diverticulitis of distal descending colon - Severe nausea with vomiting and oral intake intolerance Recently treated for possible colitis with Augmentin -symptoms recurred after discharge home -CT abdomen raises question of possible diverticulitis -GI suggest ongoing empiric antibiotic and monitoring  Acute kidney injury on CKD stage III AA Baseline creatinine approximately 1.3/GFR 59 -creatinine at presentation 3.34 -felt to be prerenal azotemia due to poor intake -monitor with volume resuscitation  Adenocarcinoma of the right colon status post right hemicolectomy and ileocolonic anastomosis 08/23/2022 CEA 6.6 -pathology revealed invasive well to moderately differentiated adenocarcinoma -6 was scheduled to see Dr. Truett Perna in follow-up on the date of his presentation to the ER  CAD Continue rosuvastatin  Chronic atrial fibrillation On Xarelto chronically -on amiodarone chronically  Chronic diastolic congestive heart failure TTE June 2024 noted EF 50% -hold diuretic as patient is presently  dehydrated  Mood disorder Continue usual home medications   Family Communication: No family present at time of visit Disposition: To be determined   Objective: Blood pressure 128/61, pulse (!) 50, temperature 97.9 F (36.6 C), resp. rate 16, height 5\' 11"  (1.803 m), weight 85.5 kg, SpO2 96%.  Intake/Output Summary (Last 24 hours) at 09/28/2022 1008 Last data filed at 09/28/2022 0842 Gross per 24 hour  Intake 663.35 ml  Output 600 ml  Net 63.35 ml   Filed Weights   09/27/22 1011 09/27/22 1801  Weight: 86.2 kg 85.5 kg    Examination: General: No acute respiratory distress Lungs: Clear to auscultation bilaterally without wheezes or crackles Cardiovascular: Regular rate and rhythm without murmur gallop or rub normal S1 and S2 Abdomen: Nontender, nondistended, soft, bowel sounds positive, no rebound, no ascites, no appreciable mass Extremities: No significant cyanosis, clubbing, or edema bilateral lower extremities  CBC: Recent Labs  Lab 09/27/22 1024 09/28/22 0422  WBC 10.6* 8.8  NEUTROABS 6.4  --   HGB 12.7* 11.0*  HCT 40.3 35.3*  MCV 80.0 82.1  PLT 228 199   Basic Metabolic Panel: Recent Labs  Lab 09/27/22 1024 09/28/22 0422  NA 134* 139  K 3.2* 3.7  CL 98 103  CO2 22 23  GLUCOSE 119* 72  BUN 32* 23  CREATININE 3.34* 2.27*  CALCIUM 9.3 8.6*   GFR: Estimated Creatinine Clearance: 32.7 mL/min (A) (by C-G formula based on SCr of 2.27 mg/dL (H)).   Scheduled Meds:  amiodarone  100 mg Oral Daily   buPROPion  150 mg Oral Daily   pantoprazole  40 mg Oral Daily   PARoxetine  30 mg Oral Daily   pregabalin  150 mg Oral BID   QUEtiapine  25 mg Oral QHS   Rivaroxaban  15 mg Oral Q supper   rosuvastatin  20 mg Oral Daily   sodium chloride flush  3 mL Intravenous Q12H   Continuous Infusions:  0.9 % NaCl with KCl 40 mEq / L 100 mL/hr at 09/28/22 0702   cefTRIAXone (ROCEPHIN)  IV 2 g (09/27/22 1814)   metronidazole 500 mg (09/28/22 0555)     LOS: 1 day    Lonia Blood, MD [facility] Office  [phone] Pager - Text Page per Loretha Stapler  If 7PM-7AM, please contact night-coverage per Amion 09/28/2022, 10:08 AM

## 2022-09-28 NOTE — Evaluation (Signed)
Occupational Therapy Evaluation Patient Details Name: Brendan Butler MRN: [id number] DOB: September 22, 1950 Today's Date: 09/28/2022   History of Present Illness Patient is a 72 year old male coming from home with nausea. History of recent colon cancer hospitalizations, hemicolectomy in June and admitted in July with colitis.   Clinical Impression   At baseline, pt completes ADLs, IADLs, and functional mobility Independently and drives. Pt's daughters live nearby and are available to assist pt 24 hours a day. Pt now presents with decreased activity tolerance, decreased standing balance during functional tasks, impaired safety awareness, and decreased safety and independence with ADLs and functional mobility/transfers. Pt currently demonstrates ability to complete UB ADLs Independent to Supervision, LB ADLs with Supervision, and functional mobility/transfers without an AD with close Supervision to Min guard assist. Pt will benefit from acute skilled OT services to address deficits outlined below, for education in energy conservation techniques, and to increase safety and independence with ADLs, functional transfers, and functional mobility. Post acute discharge, no OT follow up is indicated at this time.      Recommendations for follow up therapy are one component of a multi-disciplinary discharge planning process, led by the attending physician.  Recommendations may be updated based on patient status, additional functional criteria and insurance authorization.   Assistance Recommended at Discharge Intermittent Supervision/Assistance  Patient can return home with the following A little help with walking and/or transfers;A little help with bathing/dressing/bathroom;Assistance with cooking/housework;Direct supervision/assist for medications management;Direct supervision/assist for financial management;Assist for transportation;Help with stairs or ramp for entrance    Functional Status Assessment  Patient  has had a recent decline in their functional status and demonstrates the ability to make significant improvements in function in a reasonable and predictable amount of time.  Equipment Recommendations  None recommended by OT    Recommendations for Other Services       Precautions / Restrictions Precautions Precautions: Fall Restrictions Weight Bearing Restrictions: No      Mobility Bed Mobility Overal bed mobility: Modified Independent Bed Mobility: Supine to Sit, Sit to Supine     Supine to sit: Modified independent (Device/Increase time) Sit to supine: Modified independent (Device/Increase time)   General bed mobility comments: increased time    Transfers Overall transfer level: Modified independent Equipment used: None Transfers: Sit to/from Stand Sit to Stand: Modified independent (Device/Increase time)           General transfer comment: Pt noted to be mildly impulsive with movement this session but with no LOB noted in sitting or standing.      Balance Overall balance assessment: Needs assistance Sitting-balance support: No upper extremity supported, Feet supported Sitting balance-Leahy Scale: Good     Standing balance support: No upper extremity supported, During functional activity Standing balance-Leahy Scale: Fair (Fair to Good)                             ADL either performed or assessed with clinical judgement   ADL Overall ADL's : Needs assistance/impaired Eating/Feeding: Independent;Sitting   Grooming: Independent;Sitting   Upper Body Bathing: Supervision/ safety   Lower Body Bathing: Supervison/ safety   Upper Body Dressing : Independent;Sitting   Lower Body Dressing: Supervision/safety;Sit to/from stand   Toilet Transfer: Supervision/safety;Ambulation;Grab Paramedic Details (indicate cue type and reason): Close Supervision secondary to occasional impulsiveness with movement Toileting- Clothing  Manipulation and Hygiene: Supervision/safety;Sit to/from stand       Functional mobility during ADLs: Supervision/safety;Min guard;Cueing  for safety (Close Supervision to Min guard for safety) General ADL Comments: Pt with decreased activity tolerance, mildly decreased decreased safety awareness, and occasional impulsiveness during functional tasks.     Vision Baseline Vision/History: 1 Wears glasses (Pt reports he is "supposed to wear them" but typically does not unless he is reading) Ability to See in Adequate Light: 0 Adequate (with glasses) Patient Visual Report: No change from baseline       Perception     Praxis      Pertinent Vitals/Pain Pain Assessment Pain Assessment: No/denies pain     Hand Dominance Right   Extremity/Trunk Assessment Upper Extremity Assessment Upper Extremity Assessment: Overall WFL for tasks assessed   Lower Extremity Assessment Lower Extremity Assessment: Defer to PT evaluation   Cervical / Trunk Assessment Cervical / Trunk Assessment: Normal   Communication Communication Communication: No difficulties   Cognition Arousal/Alertness: Awake/alert Behavior During Therapy: WFL for tasks assessed/performed Overall Cognitive Status: No family/caregiver present to determine baseline cognitive functioning Area of Impairment: Safety/judgement, Memory                     Memory: Decreased short-term memory Following Commands: Follows multi-step commands consistently Safety/Judgement: Decreased awareness of safety     General Comments: Pt is AAOx4, demonstrates ability to follow multi-step commands consistantly. Pt with noted mild short term memory deficits this session.     General Comments  VSS on RA throughout session. RN present during a portion of session.    Exercises     Shoulder Instructions      Home Living Family/patient expects to be discharged to:: Private residence Living Arrangements: Alone Available Help at  Discharge: Family;Available 24 hours/day;Friend(s) Type of Home: House Home Access: Stairs to enter [employer] of Steps: 3 Entrance Stairs-Rails: Can reach both;Left;Right Home Layout: One level     Bathroom Shower/Tub: Chief Strategy Officer: Handicapped height Bathroom Accessibility: Yes How Accessible: Accessible via walker Home Equipment: Rolling Walker (2 wheels);Rollator (4 wheels);Standard Walker;Cane - single point;Wheelchair - manual;Other (comment);Grab bars - tub/shower;Hand held shower head;Shower seat   Additional Comments: patient reports he has DME but does not use it      Prior Functioning/Environment Prior Level of Function : Independent/Modified Independent             Mobility Comments: Independent with functional mobility without assistive device ADLs Comments: Pt in Indepedent with ADLs, IADLs, drives, and enjoys gardening and workin in his yard.        OT Problem List: Decreased activity tolerance;Impaired balance (sitting and/or standing);Decreased safety awareness      OT Treatment/Interventions: Self-care/ADL training;Patient/family education;Energy conservation;Balance training;Therapeutic activities    OT Goals(Current goals can be found in the care plan section) Acute Rehab OT Goals Patient Stated Goal: To return home and continue to be independent OT Goal Formulation: With patient Time For Goal Achievement: 10/12/22 Potential to Achieve Goals: Good ADL Goals Pt Will Perform Upper Body Bathing: Independently;standing Pt Will Perform Lower Body Bathing: Independently;sit to/from stand Pt Will Transfer to Toilet: Independently;ambulating (comfort height toilet with no AD) Pt Will Perform Toileting - Clothing Manipulation and hygiene: Independently;sit to/from stand Additional ADL Goal #1: Patient will demonstrate ability to Independently state 4 energy conservation techniques to increase safety and independence with ADLs,  IADLs, and functional mobility.  OT Frequency: Min 1X/week    Co-evaluation              AM-PAC OT "6 Clicks" Daily Activity  Outcome Measure Help from another person eating meals?: None Help from another person taking care of personal grooming?: None (in sitting) Help from another person toileting, which includes using toliet, bedpan, or urinal?: A Little Help from another person bathing (including washing, rinsing, drying)?: A Little Help from another person to put on and taking off regular upper body clothing?: None Help from another person to put on and taking off regular lower body clothing?: A Little 6 Click Score: 21   End of Session Nurse Communication: Mobility status  Activity Tolerance: Patient tolerated treatment well Patient left: in bed;with call bell/phone within reach;with bed alarm set  OT Visit Diagnosis: Other (comment);Unsteadiness on feet (R26.81) (Decreased activity tolerance)                Time: 1610-9604 OT Time Calculation (min): 17 min Charges:  OT General Charges $OT Visit: 1 Visit OT Evaluation $OT Eval Low Complexity: 1 Low   "Orson Eva., OTR/L, MA Acute Rehab [phone]   Lendon Colonel 09/28/2022, 5:33 PM

## 2022-09-28 NOTE — Evaluation (Signed)
Physical Therapy Evaluation Patient Details Name: Brendan Butler MRN: [id number] DOB: [birth year] Today's Date: 09/28/2022  History of Present Illness  Patient is a 72 year old male coming from home with nausea. History of recent colon cancer hospitalizations, hemicolectomy in June and admitted in July with colitis.   Clinical Impression  Patient agreeable to PT evaluation with encouragement. He reports he lives at home alone and is independent without assistive device for ambulation. He reports he has DME in the home but does not use any.  The patient required no physical assistance for bed mobility. He was able to perform lateral scoot transfer with supervision. He reports feeling overall poorly today but unable to describe further. He reports no pain, no nausea, no dizziness with mobility. He declined to stand or ambulation but continues reporting he is independent. He requested to return to bed to sleep after sitting up briefly on the side of the bed. Recommend to continue PT to maximize independence and decrease caregiver burden. The patient wants to return home.       If plan is discharge home, recommend the following: A little help with walking and/or transfers;A little help with bathing/dressing/bathroom;Assistance with cooking/housework;Assist for transportation;Help with stairs or ramp for entrance   Can travel by private vehicle        Equipment Recommendations None recommended by PT  Recommendations for Other Services       Functional Status Assessment Patient has had a recent decline in their functional status and demonstrates the ability to make significant improvements in function in a reasonable and predictable amount of time.     Precautions / Restrictions Precautions Precautions: Fall Restrictions Weight Bearing Restrictions: No      Mobility  Bed Mobility Overal bed mobility: Modified Independent             General bed mobility comments: increased  time    Transfers Overall transfer level: Needs assistance   Transfers: Bed to chair/wheelchair/BSC            Lateral/Scoot Transfers: Supervision General transfer comment: patient is able to scoot to the right while sitting with supervision and increased time required. he refused to stand due to not feeling well but unable to describe further (reports no nausea, no pain, no dizziness)    Ambulation/Gait               General Gait Details: patient declined to walk at this time  Stairs            Wheelchair Mobility     Tilt Bed    Modified Rankin (Stroke Patients Only)       Balance Overall balance assessment: Needs assistance Sitting-balance support: Feet supported Sitting balance-Leahy Scale: Fair Sitting balance - Comments: relying on UE support at times. no loss of balance is noted                                     Pertinent Vitals/Pain Pain Assessment Pain Assessment: No/denies pain    Home Living Family/patient expects to be discharged to:: Private residence Living Arrangements: Alone Available Help at Discharge: Family;Available 24 hours/day;Friend(s) Type of Home: House Home Access: Stairs to enter Entrance Stairs-Rails: Can reach both;Left;Right Entrance Stairs-Number of Steps: 3   Home Layout: One level Home Equipment: Agricultural consultant (2 wheels);Rollator (4 wheels);Standard Walker;Cane - single point;Wheelchair - manual;Other (comment);Grab bars - tub/shower;Hand held shower head Additional Comments: patient  reports he has DME but does not use it    Prior Function Prior Level of Function : Independent/Modified Independent             Mobility Comments: ambulation without assistive device       Hand Dominance        Extremity/Trunk Assessment   Upper Extremity Assessment Upper Extremity Assessment: Defer to OT evaluation    Lower Extremity Assessment Lower Extremity Assessment: Generalized weakness  (endurance impaired for sustained activity)       Communication   Communication: No difficulties  Cognition Arousal/Alertness: Lethargic Behavior During Therapy: WFL for tasks assessed/performed Overall Cognitive Status: No family/caregiver present to determine baseline cognitive functioning                       Memory: Decreased short-term memory Following Commands: Follows multi-step commands inconsistently     Problem Solving: Slow processing, Requires verbal cues General Comments: patient is groggy and has eyes closed intermittently        General Comments General comments (skin integrity, edema, etc.): patient requested to return to bed to sleep after sitting up less than 3 minutes    Exercises     Assessment/Plan    PT Assessment Patient needs continued PT services  PT Problem List Decreased activity tolerance;Decreased mobility       PT Treatment Interventions DME instruction;Gait training;Stair training;Functional mobility training;Therapeutic activities;Therapeutic exercise;Balance training;Neuromuscular re-education    PT Goals (Current goals can be found in the Care Plan section)  Acute Rehab PT Goals Patient Stated Goal: to go home PT Goal Formulation: With patient Time For Goal Achievement: 10/05/22 Potential to Achieve Goals: Fair    Frequency Min 1X/week     Co-evaluation               AM-PAC PT "6 Clicks" Mobility  Outcome Measure Help needed turning from your back to your side while in a flat bed without using bedrails?: None Help needed moving from lying on your back to sitting on the side of a flat bed without using bedrails?: None Help needed moving to and from a bed to a chair (including a wheelchair)?: A Little Help needed standing up from a chair using your arms (e.g., wheelchair or bedside chair)?: A Little Help needed to walk in hospital room?: A Little Help needed climbing 3-5 steps with a railing? : A Little 6 Click  Score: 20    End of Session   Activity Tolerance: Patient limited by fatigue Patient left: in bed;with call bell/phone within reach;with bed alarm set   PT Visit Diagnosis: Other abnormalities of gait and mobility (R26.89);Other symptoms and signs involving the nervous system (R29.898)    Time: 6578-4696 PT Time Calculation (min) (ACUTE ONLY): 10 min   Charges:   PT Evaluation $PT Eval Low Complexity: 1 Low   PT General Charges $$ ACUTE PT VISIT: 1 Visit         Donna Bernard, PT, MPT   Ina Homes 09/28/2022, 11:34 AM

## 2022-09-28 NOTE — Progress Notes (Addendum)
Initial Nutrition Assessment  DOCUMENTATION CODES:   Severe malnutrition in context of acute illness/injury  INTERVENTION:   Prosource Plus PO, which provides 100 kcal, 15 g protein  Added diet edu on low fiber diet to AVS   Education on adequate nutrition  RD to order vanilla carnation instant breakfast (CIB) TID with meals when patient is able to advance to full liquids   NUTRITION DIAGNOSIS:   Severe Malnutrition related to acute illness as evidenced by energy intake < or equal to 50% for > or equal to 5 days, moderate muscle depletion, percent weight loss, mild fat depletion, severe fat depletion, mild muscle depletion.  GOAL:   Patient will meet greater than or equal to 90% of their needs  MONITOR:   PO intake, Skin, Supplement acceptance, I & O's, Labs, Weight trends  REASON FOR ASSESSMENT:   Consult Assessment of nutrition requirement/status  ASSESSMENT:    71 y.o. male with PMHx including CVA, HFrEF, CKD 3, HTN, HLD, and OSA presents with abdominal pain and N/V since recent hospital discharge. Patient admitted for diverticulitis.  Per MD notes:  - Patient recently hospitalized on 7/15-18 and and diagnosed with colitis following a hemicolectomy for colon cancer on 6/27.   - Patient reported he felt good for a day or 2 after discharge but feels like his medication is irritating his GI tract. Patient has had N/V/D. No diarrhea for the past few days. Poor po intake. Feels like he cannot eat at all  RD visited patient at bedside whose daughter was present. Patient reported immediately that he was hungry and wish he had something to make his mouth less dry. RD provided patient shaved ice, jello and bottled water at bedside. He immediately started to eat the shaved ice while giving hx. Patient reports the only things he has been able to tolerate were water and iced tea since he was discharged a few weeks ago.  His daughter at bedside reports that she was trying to get him  to follow the BRAT diet along with supplementing with vanilla Boost, but he was not tolerating that either.   Patient reports UBW as 220#. He is well aware of his weight loss and how rapidly it happened. He reports no recent BM and nausea has subsided.   RD discussed diverticulitis, maldigestion and malabsorption with patient and his daughter at bedside. They comprehended very well.   GI to assess patient.   Patient does not tolerate Ensure or any non vanilla flavored Boost. He is willing to try CIB vanilla when diet advanced to full liquids.   Patient was giving his clear liquid lunch order to hostess after visit.   Labs: Cr 2.27 Meds: rocephin, protonix, flagyl, protonix, lyrica, seroquel, crestor, NS+KCl @100  ml/hr   Wt: 16.1 kg (15.8%) wt loss x 2 months  09/27/22 85.5 kg  09/10/22 96.4 kg  09/03/22 96.4 kg  08/28/22 92 kg  07/30/22 101.6 kg   PO: no meals documented   I/O's: +63 ml since admission    NUTRITION - FOCUSED PHYSICAL EXAM:  Flowsheet Row Most Recent Value  Orbital Region Severe depletion  Upper Arm Region Mild depletion  Thoracic and Lumbar Region Unable to assess  Buccal Region Severe depletion  Temple Region Moderate depletion  Clavicle Bone Region Mild depletion  Clavicle and Acromion Bone Region Mild depletion  Scapular Bone Region Unable to assess  Dorsal Hand Moderate depletion  Patellar Region Unable to assess  Anterior Thigh Region Unable to assess  Posterior Calf  Region Moderate depletion  Edema (RD Assessment) None  Hair Reviewed  Eyes Reviewed  Mouth Reviewed  Skin Reviewed  Nails Reviewed       Diet Order:   Diet Order             Diet clear liquid Room service appropriate? Yes; Fluid consistency: Thin  Diet effective now                   EDUCATION NEEDS:   Education needs have been addressed  Skin:  Skin Assessment: Reviewed RN Assessment  Last BM:  7/30  Height:   Ht Readings from Last 1 Encounters:  09/27/22  5\' 11"  (1.803 m)    Weight:   Wt Readings from Last 1 Encounters:  09/27/22 85.5 kg    Ideal Body Weight:     BMI:  Body mass index is 26.29 kg/m.  Estimated Nutritional Needs:   Kcal:  1800-2100  Protein:  100-130 g  Fluid:  > 2L   Leodis Rains, RDN, LDN  Clinical Nutrition

## 2022-09-29 DIAGNOSIS — N189 Chronic kidney disease, unspecified: Secondary | ICD-10-CM | POA: Diagnosis not present

## 2022-09-29 DIAGNOSIS — N179 Acute kidney failure, unspecified: Secondary | ICD-10-CM | POA: Diagnosis not present

## 2022-09-29 MED ORDER — ALPRAZOLAM 0.25 MG PO TABS
0.2500 mg | ORAL_TABLET | Freq: Three times a day (TID) | ORAL | Status: DC | PRN
  Administered 2022-09-29: 0.25 mg via ORAL
  Filled 2022-09-29: qty 1

## 2022-09-29 NOTE — Progress Notes (Signed)
Pt asking for a soft diet for breakfast. Pt states he is hungry and feels like he can tolerate it now. Pt has been doing well on clears. Pt denies nausea. RN messaged MD on call.   Devonne Doughty Aziza Stuckert

## 2022-09-29 NOTE — Plan of Care (Signed)
°  Problem: Education: °Goal: Knowledge of General Education information will improve °Description: Including pain rating scale, medication(s)/side effects and non-pharmacologic comfort measures °Outcome: Completed/Met °  °

## 2022-09-29 NOTE — Progress Notes (Signed)
Pt demonstrates angst in regard to bed alarm being set despite RN multiple attempts to instruct him that it is provided for his safety.  RN had pt demonstrate his ability to stand and take IV pole to bathroom with him. RN had to prompt pt and pt was unsteady on his feet when initially standing to get out of bed. RN walked with pt in hallway as he pushed his IV pole independently.  RN set bed alarm despite pt protests, re emphasizing that it is for his safety

## 2022-09-29 NOTE — Progress Notes (Signed)
Subjective: No further nausea/vomiting. No abdominal pain.  Objective: Vital signs in last 24 hours: Temp:  [97.6 F (36.4 C)-98 F (36.7 C)] 98 F (36.7 C) (08/03 0700) Pulse Rate:  [43-58] 58 (08/03 0700) Resp:  [17-18] 17 (08/03 0700) BP: (130-165)/(56-65) 165/65 (08/03 0700) SpO2:  [97 %-100 %] 100 % (08/03 0700) Weight change:  Last BM Date : 09/25/22  PE: GEN:  NAD ABD:  Old surgical scars, mild distended, non tender, no peritonitis  Lab Results: CBC    Component Value Date/Time   WBC 6.5 09/29/2022 0126   RBC 3.96 (L) 09/29/2022 0126   HGB 10.5 (L) 09/29/2022 0126   HGB 9.0 (L) 09/03/2022 1100   HGB 13.2 11/07/2016 1155   HCT 33.5 (L) 09/29/2022 0126   HCT 29.6 (L) 09/03/2022 1100   HCT 39.3 11/07/2016 1155   PLT 197 09/29/2022 0126   PLT 331 09/03/2022 1100   MCV 84.6 09/29/2022 0126   MCV 84 09/03/2022 1100   MCV 88.3 11/07/2016 1155   MCH 26.5 09/29/2022 0126   MCHC 31.3 09/29/2022 0126   RDW 18.8 (H) 09/29/2022 0126   RDW 18.5 (H) 09/03/2022 1100   RDW 12.7 11/07/2016 1155   LYMPHSABS 3.0 09/27/2022 1024   LYMPHSABS 3.7 (H) 11/07/2016 1155   MONOABS 0.9 09/27/2022 1024   MONOABS 1.7 (H) 11/07/2016 1155   EOSABS 0.1 09/27/2022 1024   EOSABS 0.2 11/07/2016 1155   BASOSABS 0.1 09/27/2022 1024   BASOSABS 0.0 11/07/2016 1155  CMP     Component Value Date/Time   NA 139 09/29/2022 0126   NA 139 09/03/2022 1100   NA 139 11/07/2016 1155   K 4.5 09/29/2022 0126   K 4.0 11/07/2016 1155   CL 111 09/29/2022 0126   CO2 21 (L) 09/29/2022 0126   CO2 25 11/07/2016 1155   GLUCOSE 88 09/29/2022 0126   GLUCOSE 103 11/07/2016 1155   BUN 18 09/29/2022 0126   BUN 15 09/03/2022 1100   BUN 12.6 11/07/2016 1155   CREATININE 1.73 (H) 09/29/2022 0126   CREATININE 1.2 11/07/2016 1155   CALCIUM 8.3 (L) 09/29/2022 0126   CALCIUM 9.1 11/07/2016 1155   PROT 5.3 (L) 09/29/2022 0126   PROT 7.0 11/07/2016 1155   ALBUMIN 2.8 (L) 09/29/2022 0126   ALBUMIN 3.7 11/07/2016  1155   AST 18 09/29/2022 0126   AST 30 11/07/2016 1155   ALT 14 09/29/2022 0126   ALT 49 11/07/2016 1155   ALKPHOS 62 09/29/2022 0126   ALKPHOS 84 11/07/2016 1155   BILITOT 0.3 09/29/2022 0126   BILITOT 0.70 11/07/2016 1155   EGFR 35 (L) 09/03/2022 1100   GFRNONAA 42 (L) 09/29/2022 0126   Assessment:   Diverticulitis, improving. Nausea, vomiting, abdominal pain; resolved. Acute on chronic kidney insufficiency. Right hemicolectomy colonic adenocarcinoma about one month ago.  Plan:   IV antibiotics. Advance to soft diet; ok to advance further as tolerated. Supportive care with antiemetics as needed. If tolerates advance in diet, consider change to po antibiotics tomorrow and possible discharge home tomorrow vs Monday. [facility] will follow.   Brendan Butler 09/29/2022, 11:20 AM   Cell [phone] If no answer or after 5 PM call [phone]

## 2022-09-29 NOTE — Progress Notes (Addendum)
Brendan Butler  ZOX:[id number] DOB: [birth year] DOA: 09/27/2022 PCP: [name], MD    Brief Narrative:  72 year old with a history of recently diagnosed colon cancer status post hemicolectomy, atrial fibrillation on Xarelto, CVA, diastolic congestive heart failure, CKD stage III, HTN, HLD, and OSA who was hospitalized 7/15-7/18/2024 with nausea vomiting and concern for colitis following a recent hemicolectomy for colon cancer.  He returned to the ER 09/27/2022 for this admission with recurrent nausea and vomiting.  He had been unable to tolerate his oral medications or even liquid intake.   Goals of Care:   Code Status: Full Code   DVT prophylaxis: Rivaroxaban (XARELTO) tablet 15 mg   Interim Hx: No acute events reported overnight.  Afebrile.  Blood pressure modestly elevated but vitals otherwise stable.  Renal function improving steadily.  WBC normalized.  The patient states he is feeling much better overall.  He is ready for his diet to be advanced.  He is annoyed by his bed alarm and wishes to have more freedom to move around the unit in his room.  Assessment & Plan:  Acute diverticulitis of distal descending colon - Severe nausea with vomiting and oral intake intolerance Recently treated for possible colitis with Augmentin - symptoms recurred after discharge home - CT abdomen suggests acute diverticulitis - GI suggests ongoing empiric antibiotic and monitoring -clinically improving at present -slowly advancing diet  Acute kidney injury on CKD stage III AA Baseline creatinine approximately 1.3/GFR 59 -creatinine at presentation 3.34 -felt to be prerenal azotemia due to poor intake -creatinine steadily improving with volume resuscitation  Recent Labs  Lab 09/27/22 1024 09/28/22 0422 09/29/22 0126  CREATININE 3.34* 2.27* 1.73*    Adenocarcinoma of the right colon status post right hemicolectomy and ileocolonic anastomosis 08/23/2022 CEA 6.6 -pathology revealed invasive well to  moderately differentiated adenocarcinoma - was scheduled to see Dr. Truett Perna in follow-up on the date of his presentation to the ER -will need rescheduled oncology visit after discharge  CAD Continue rosuvastatin  Chronic atrial fibrillation On Xarelto chronically -on amiodarone chronically -controlled at present  Chronic diastolic congestive heart failure TTE June 2024 noted EF 50% - euvolemic to dehydrated at present - continue to hold diuretic  Mood disorder Continue usual home medications   Family Communication: I spoke with the patient's daughter via telephone with permission from the patient Disposition: To be determined   Objective: Blood pressure (!) 165/65, pulse (!) 58, temperature 98 F (36.7 C), resp. rate 17, height 5\' 11"  (1.803 m), weight 85.5 kg, SpO2 100%.  Intake/Output Summary (Last 24 hours) at 09/29/2022 0933 Last data filed at 09/29/2022 0900 Gross per 24 hour  Intake 1203 ml  Output 1085 ml  Net 118 ml   Filed Weights   09/27/22 1011 09/27/22 1801  Weight: 86.2 kg 85.5 kg    Examination: General: No acute respiratory distress Lungs: Clear to auscultation bilaterally - no wheezing  Cardiovascular: Regular rate and rhythm without murmur  Abdomen: Nontender, nondistended, soft, bowel sounds positive, no rebound Extremities: No significant edema BLE  CBC: Recent Labs  Lab 09/27/22 1024 09/28/22 0422 09/29/22 0126  WBC 10.6* 8.8 6.5  NEUTROABS 6.4  --   --   HGB 12.7* 11.0* 10.5*  HCT 40.3 35.3* 33.5*  MCV 80.0 82.1 84.6  PLT 228 199 197   Basic Metabolic Panel: Recent Labs  Lab 09/27/22 1024 09/28/22 0422 09/29/22 0126  NA 134* 139 139  K 3.2* 3.7 4.5  CL 98 103 111  CO2 22 23 21*  GLUCOSE 119* 72 88  BUN 32* 23 18  CREATININE 3.34* 2.27* 1.73*  CALCIUM 9.3 8.6* 8.3*  MG  --   --  2.1   GFR: Estimated Creatinine Clearance: 42.9 mL/min (A) (by C-G formula based on SCr of 1.73 mg/dL (H)).   Scheduled Meds:  (feeding supplement)  PROSource Plus  30 mL Oral TID BM   amiodarone  100 mg Oral Daily   buPROPion  150 mg Oral Daily   pantoprazole  40 mg Oral Daily   PARoxetine  30 mg Oral Daily   pregabalin  150 mg Oral BID   QUEtiapine  25 mg Oral QHS   Rivaroxaban  15 mg Oral Q supper   rosuvastatin  20 mg Oral Daily   sodium chloride flush  3 mL Intravenous Q12H   Continuous Infusions:  0.9 % NaCl with KCl 40 mEq / L 100 mL/hr at 09/29/22 0525   cefTRIAXone (ROCEPHIN)  IV 2 g (09/28/22 1656)   metronidazole 500 mg (09/29/22 0528)     LOS: 2 days   Lonia Blood, MD [facility] Office  [phone] Pager - Text Page per Loretha Stapler  If 7PM-7AM, please contact night-coverage per Amion 09/29/2022, 9:33 AM

## 2022-09-30 DIAGNOSIS — N189 Chronic kidney disease, unspecified: Secondary | ICD-10-CM | POA: Diagnosis not present

## 2022-09-30 DIAGNOSIS — N179 Acute kidney failure, unspecified: Secondary | ICD-10-CM | POA: Diagnosis not present

## 2022-09-30 MED ORDER — METRONIDAZOLE 500 MG PO TABS: 500.0000 mg | ORAL_TABLET | Freq: Two times a day (BID) | ORAL | 0 refills | Status: AC

## 2022-09-30 MED ORDER — METRONIDAZOLE 500 MG PO TABS
500.0000 mg | ORAL_TABLET | Freq: Two times a day (BID) | ORAL | Status: DC
  Administered 2022-09-30: 500 mg via ORAL
  Filled 2022-09-30: qty 1

## 2022-09-30 MED ORDER — CIPROFLOXACIN HCL 500 MG PO TABS
500.0000 mg | ORAL_TABLET | Freq: Two times a day (BID) | ORAL | Status: DC
  Administered 2022-09-30: 500 mg via ORAL
  Filled 2022-09-30: qty 1

## 2022-09-30 MED ORDER — CIPROFLOXACIN HCL 500 MG PO TABS: 500.0000 mg | ORAL_TABLET | Freq: Two times a day (BID) | ORAL | 0 refills | Status: AC

## 2022-09-30 NOTE — Discharge Summary (Signed)
DISCHARGE SUMMARY  Brendan Butler  MR#: [id number]  DOB:09/23/1950  Date of Admission: 09/27/2022 Date of Discharge: 09/30/2022  Attending Physician: Silvestre Gunner, MD  Patient's WUJ:[id number], Jerelyn Scott, MD  Consults:GI  Disposition: D/C home   Follow-up Appts:  Follow-up Information     [name], MD Follow up in 7 day(s).   Specialty: Internal Medicine Contact information: [address] [place] [address] [phone]                Discharge Diagnoses: Acute diverticulitis of distal descending colon Severe nausea with vomiting and oral intake intolerance Acute kidney injury on CKD stage III AA Adenocarcinoma of the right colon status post right hemicolectomy and ileocolonic anastomosis 08/23/2022 CAD Chronic atrial fibrillation Cronic diastolic congestive heart failure without acute exacerbation  Mood disorder   Initial presentation: 72 year old with a history of recently diagnosed colon cancer status post hemicolectomy, atrial fibrillation on Xarelto, CVA, diastolic congestive heart failure, CKD stage III, HTN, HLD, and OSA who was hospitalized 7/15-7/18/2024 with nausea vomiting and concern for colitis following a recent hemicolectomy for colon cancer. He returned to the ER 09/27/2022 for this admission with recurrent nausea and vomiting. He had been unable to tolerate his oral medications or even liquid intake.   Hospital Course:  Acute diverticulitis of distal descending colon - Severe nausea with vomiting and oral intake intolerance Recently treated for possible colitis with Augmentin - symptoms recurred after discharge home - CT abdomen suggests acute diverticulitis - rapidly improved with empiric IV antibiotics - diet able to be advanced successfully  -transitioned to oral abx at time of d/c to compete 10 days of abx tx    Acute kidney injury on CKD stage III AA Baseline creatinine approximately 1.3/GFR 59 -creatinine at  presentation 3.34 -felt to be prerenal azotemia due to poor intake -creatinine steadily improved with volume resuscitation  Recent Labs  Lab 09/27/22 1024 09/28/22 0422 09/29/22 0126  CREATININE 3.34* 2.27* 1.73*    Adenocarcinoma of the right colon status post right hemicolectomy and ileocolonic anastomosis 08/23/2022 CEA 6.6 -pathology revealed invasive well to moderately differentiated adenocarcinoma - was scheduled to see Dr. Truett Perna in follow-up on the date of his presentation to the ER - will need rescheduled Oncology visit after discharge   CAD Continue rosuvastatin   Chronic atrial fibrillation On Xarelto chronically - on amiodarone chronically -rate controlled during this admission    Chronic diastolic congestive heart failure TTE June 2024 noted EF 50% - euvolemic to dehydrated th/o this admission - resume usual diuretic regimen upon d/c home with increased oral intake    Mood disorder Continue usual home medications  Allergies as of 09/30/2022       Reactions   Levitra [vardenafil] Other (See Comments)   Blurred vision   Aspirin Hives   Lipitor [atorvastatin] Other (See Comments)   Muscle cramps; patient states he does not take medication at home        Medication List     TAKE these medications    Acetaminophen Extra Strength 500 MG Tabs Take 2 tablets (1,000 mg total) by mouth every 6 (six) hours.   amiodarone 200 MG tablet Commonly known as: PACERONE Take 1/2 tablet (100 mg total) by mouth daily.   buPROPion 150 MG 24 hr tablet Commonly known as: WELLBUTRIN XL Take 1 tablet (150 mg total) by mouth daily.   CertaVite/Antioxidants Tabs Take 1 tablet by mouth daily.   ciprofloxacin 500 MG tablet Commonly known as: CIPRO  Take 1 tablet (500 mg total) by mouth 2 (two) times daily for 6 days.   empagliflozin 10 MG Tabs tablet Commonly known as: JARDIANCE Take 1 tablet (10 mg total) by mouth daily.   ferrous sulfate 325 (65 FE) MG EC tablet Take 1  tablet (325 mg total) by mouth 2 (two) times daily.   fluticasone 50 MCG/ACT nasal spray Commonly known as: FLONASE Place 1 spray into both nostrils daily as needed for allergies.   furosemide 40 MG tablet Commonly known as: LASIX Take 1 tablet (40 mg total) by mouth 2 (two) times daily.   lactose free nutrition Liqd Take 237 mLs by mouth 2 (two) times daily between meals.   methocarbamol 500 MG tablet Commonly known as: ROBAXIN Take 1 tablet (500 mg total) by mouth every 8 (eight) hours as needed for muscle spasms.   metroNIDAZOLE 500 MG tablet Commonly known as: FLAGYL Take 1 tablet (500 mg total) by mouth every 12 (twelve) hours for 6 days.   ondansetron 4 MG tablet Commonly known as: Zofran Take 1 tablet (4 mg total) by mouth every 8 (eight) hours as needed for nausea or vomiting.   oxyCODONE 5 MG immediate release tablet Commonly known as: Oxy IR/ROXICODONE Take 1 tablet (5 mg total) by mouth every 6 (six) hours as needed for moderate pain.   pantoprazole 40 MG tablet Commonly known as: PROTONIX Take 1 tablet (40 mg total) by mouth daily.   PARoxetine 30 MG tablet Commonly known as: PAXIL Take 30 mg by mouth daily.   potassium chloride SA 20 MEQ tablet Commonly known as: KLOR-CON M Take 2 tablets (40 mEq total) by mouth daily.   pregabalin 150 MG capsule Commonly known as: LYRICA Take 1 capsule (150 mg total) by mouth 2 (two) times daily.   QUEtiapine 25 MG tablet Commonly known as: SEROQUEL Take 1 tablet (25 mg total) by mouth at bedtime.   rosuvastatin 20 MG tablet Commonly known as: CRESTOR Take 1 tablet (20 mg total) by mouth daily.   sacubitril-valsartan 24-26 MG Commonly known as: ENTRESTO Take 1 tablet by mouth 2 (two) times daily.   spironolactone 25 MG tablet Commonly known as: ALDACTONE Take 1/2 tablet (12.5 mg total) by mouth daily.   Xarelto 15 MG Tabs tablet Generic drug: Rivaroxaban Take 1 tablet (15 mg total) by mouth daily with  supper.        Day of Discharge BP (!) 160/53 (BP Location: Right Arm)   Pulse (!) 52   Temp 98 F (36.7 C)   Resp 17   Ht 5\' 11"  (1.803 m)   Wt 88.6 kg   SpO2 99%   BMI 27.24 kg/m   Physical Exam: General: No acute respiratory distress Lungs: Clear to auscultation bilaterally without wheezes or crackles Cardiovascular: Regular rate and rhythm without murmur gallop or rub normal S1 and S2 Abdomen: Nontender, nondistended, soft, bowel sounds positive, no rebound, no ascites, no appreciable mass Extremities: No significant cyanosis, clubbing, or edema bilateral lower extremities  Basic Metabolic Panel: Recent Labs  Lab 09/27/22 1024 09/28/22 0422 09/29/22 0126  NA 134* 139 139  K 3.2* 3.7 4.5  CL 98 103 111  CO2 22 23 21*  GLUCOSE 119* 72 88  BUN 32* 23 18  CREATININE 3.34* 2.27* 1.73*  CALCIUM 9.3 8.6* 8.3*  MG  --   --  2.1    CBC: Recent Labs  Lab 09/27/22 1024 09/28/22 0422 09/29/22 0126  WBC 10.6* 8.8 6.5  NEUTROABS  6.4  --   --   HGB 12.7* 11.0* 10.5*  HCT 40.3 35.3* 33.5*  MCV 80.0 82.1 84.6  PLT 228 199 197    Time spent in discharge (includes decision making & examination of pt): 35 minutes  09/30/2022, 1:36 PM   Lonia Blood, MD [facility] Office  [phone]

## 2022-09-30 NOTE — Progress Notes (Signed)
Subjective: No nausea, abdominal pain. Tolerating diet.  Objective: Vital signs in last 24 hours: Temp:  [97.8 F (36.6 C)-98 F (36.7 C)] 98 F (36.7 C) (08/04 0829) Pulse Rate:  [52-63] 52 (08/04 0829) Resp:  [17] 17 (08/04 0829) BP: (126-166)/(53-76) 160/53 (08/04 0829) SpO2:  [98 %-100 %] 99 % (08/04 0829) Weight:  [88.6 kg] 88.6 kg (08/03 2250) Weight change:  Last BM Date : 09/29/22  PE: GEN:  NAD NEURO:  A/O, no encephalopathy  Lab Results: CBC    Component Value Date/Time   WBC 6.5 09/29/2022 0126   RBC 3.96 (L) 09/29/2022 0126   HGB 10.5 (L) 09/29/2022 0126   HGB 9.0 (L) 09/03/2022 1100   HGB 13.2 11/07/2016 1155   HCT 33.5 (L) 09/29/2022 0126   HCT 29.6 (L) 09/03/2022 1100   HCT 39.3 11/07/2016 1155   PLT 197 09/29/2022 0126   PLT 331 09/03/2022 1100   MCV 84.6 09/29/2022 0126   MCV 84 09/03/2022 1100   MCV 88.3 11/07/2016 1155   MCH 26.5 09/29/2022 0126   MCHC 31.3 09/29/2022 0126   RDW 18.8 (H) 09/29/2022 0126   RDW 18.5 (H) 09/03/2022 1100   RDW 12.7 11/07/2016 1155   LYMPHSABS 3.0 09/27/2022 1024   LYMPHSABS 3.7 (H) 11/07/2016 1155   MONOABS 0.9 09/27/2022 1024   MONOABS 1.7 (H) 11/07/2016 1155   EOSABS 0.1 09/27/2022 1024   EOSABS 0.2 11/07/2016 1155   BASOSABS 0.1 09/27/2022 1024   BASOSABS 0.0 11/07/2016 1155  CMP     Component Value Date/Time   NA 139 09/29/2022 0126   NA 139 09/03/2022 1100   NA 139 11/07/2016 1155   K 4.5 09/29/2022 0126   K 4.0 11/07/2016 1155   CL 111 09/29/2022 0126   CO2 21 (L) 09/29/2022 0126   CO2 25 11/07/2016 1155   GLUCOSE 88 09/29/2022 0126   GLUCOSE 103 11/07/2016 1155   BUN 18 09/29/2022 0126   BUN 15 09/03/2022 1100   BUN 12.6 11/07/2016 1155   CREATININE 1.73 (H) 09/29/2022 0126   CREATININE 1.2 11/07/2016 1155   CALCIUM 8.3 (L) 09/29/2022 0126   CALCIUM 9.1 11/07/2016 1155   PROT 5.3 (L) 09/29/2022 0126   PROT 7.0 11/07/2016 1155   ALBUMIN 2.8 (L) 09/29/2022 0126   ALBUMIN 3.7 11/07/2016  1155   AST 18 09/29/2022 0126   AST 30 11/07/2016 1155   ALT 14 09/29/2022 0126   ALT 49 11/07/2016 1155   ALKPHOS 62 09/29/2022 0126   ALKPHOS 84 11/07/2016 1155   BILITOT 0.3 09/29/2022 0126   BILITOT 0.70 11/07/2016 1155   EGFR 35 (L) 09/03/2022 1100   GFRNONAA 42 (L) 09/29/2022 0126   Assessment:   Diverticulitis, currently asymptomatic. Nausea, vomiting, abdominal pain; resolved. Acute on chronic kidney insufficiency. Right hemicolectomy colonic adenocarcinoma about one month ago.  Plan:   Soft, bland diet for the next 1-2 weeks. 10-14 day course outpatient antibiotics. Follow-up with oncology for further treatment colon cancer. Outpatient GI follow-up as needed. [facility] will sign-off; please call with questions; thanks for the consultation; ok for discharge home today from GI perspective.   Freddy Jaksch 09/30/2022, 1:20 PM   Cell [phone] If no answer or after 5 PM call [phone]

## 2022-09-30 NOTE — Plan of Care (Signed)
  Problem: Health Behavior/Discharge Planning: Goal: Ability to manage health-related needs will improve Outcome: Adequate for Discharge   

## 2022-10-08 ENCOUNTER — Other Ambulatory Visit (HOSPITAL_COMMUNITY): Payer: Self-pay | Admitting: Internal Medicine

## 2022-10-08 DIAGNOSIS — C189 Malignant neoplasm of colon, unspecified: Secondary | ICD-10-CM

## 2022-10-09 ENCOUNTER — Emergency Department (HOSPITAL_COMMUNITY): Payer: Medicare HMO

## 2022-10-09 ENCOUNTER — Inpatient Hospital Stay (HOSPITAL_COMMUNITY)
Admission: EM | Admit: 2022-10-09 | Discharge: 2022-10-12 | DRG: 683 | Disposition: A | Discharge status: Home / Self Care | Payer: Medicare HMO | Attending: Internal Medicine | Admitting: Internal Medicine

## 2022-10-09 ENCOUNTER — Inpatient Hospital Stay (HOSPITAL_COMMUNITY): Payer: Medicare HMO

## 2022-10-09 DIAGNOSIS — N189 Chronic kidney disease, unspecified: Secondary | ICD-10-CM | POA: Diagnosis not present

## 2022-10-09 DIAGNOSIS — K529 Noninfective gastroenteritis and colitis, unspecified: Secondary | ICD-10-CM | POA: Diagnosis present

## 2022-10-09 DIAGNOSIS — F419 Anxiety disorder, unspecified: Secondary | ICD-10-CM | POA: Diagnosis present

## 2022-10-09 DIAGNOSIS — Z888 Allergy status to other drugs, medicaments and biological substances status: Secondary | ICD-10-CM

## 2022-10-09 DIAGNOSIS — R918 Other nonspecific abnormal finding of lung field: Secondary | ICD-10-CM | POA: Diagnosis present

## 2022-10-09 DIAGNOSIS — K219 Gastro-esophageal reflux disease without esophagitis: Secondary | ICD-10-CM | POA: Diagnosis present

## 2022-10-09 DIAGNOSIS — N179 Acute kidney failure, unspecified: Secondary | ICD-10-CM | POA: Diagnosis not present

## 2022-10-09 DIAGNOSIS — I480 Paroxysmal atrial fibrillation: Secondary | ICD-10-CM | POA: Diagnosis present

## 2022-10-09 DIAGNOSIS — I491 Atrial premature depolarization: Secondary | ICD-10-CM | POA: Diagnosis present

## 2022-10-09 DIAGNOSIS — Z955 Presence of coronary angioplasty implant and graft: Secondary | ICD-10-CM

## 2022-10-09 DIAGNOSIS — Z720 Tobacco use: Secondary | ICD-10-CM

## 2022-10-09 DIAGNOSIS — I2510 Atherosclerotic heart disease of native coronary artery without angina pectoris: Secondary | ICD-10-CM | POA: Diagnosis present

## 2022-10-09 DIAGNOSIS — I5032 Chronic diastolic (congestive) heart failure: Secondary | ICD-10-CM | POA: Diagnosis present

## 2022-10-09 DIAGNOSIS — Z8673 Personal history of transient ischemic attack (TIA), and cerebral infarction without residual deficits: Secondary | ICD-10-CM

## 2022-10-09 DIAGNOSIS — D509 Iron deficiency anemia, unspecified: Secondary | ICD-10-CM | POA: Diagnosis not present

## 2022-10-09 DIAGNOSIS — Z7901 Long term (current) use of anticoagulants: Secondary | ICD-10-CM

## 2022-10-09 DIAGNOSIS — Z820 Family history of epilepsy and other diseases of the nervous system: Secondary | ICD-10-CM

## 2022-10-09 DIAGNOSIS — R296 Repeated falls: Secondary | ICD-10-CM | POA: Diagnosis present

## 2022-10-09 DIAGNOSIS — E871 Hypo-osmolality and hyponatremia: Secondary | ICD-10-CM | POA: Diagnosis present

## 2022-10-09 DIAGNOSIS — I4892 Unspecified atrial flutter: Secondary | ICD-10-CM | POA: Diagnosis present

## 2022-10-09 DIAGNOSIS — Z043 Encounter for examination and observation following other accident: Secondary | ICD-10-CM | POA: Diagnosis not present

## 2022-10-09 DIAGNOSIS — Z9889 Other specified postprocedural states: Secondary | ICD-10-CM

## 2022-10-09 DIAGNOSIS — R531 Weakness: Secondary | ICD-10-CM

## 2022-10-09 DIAGNOSIS — Z602 Problems related to living alone: Secondary | ICD-10-CM | POA: Diagnosis present

## 2022-10-09 DIAGNOSIS — D72829 Elevated white blood cell count, unspecified: Secondary | ICD-10-CM | POA: Diagnosis present

## 2022-10-09 DIAGNOSIS — W19XXXA Unspecified fall, initial encounter: Secondary | ICD-10-CM | POA: Diagnosis not present

## 2022-10-09 DIAGNOSIS — G4733 Obstructive sleep apnea (adult) (pediatric): Secondary | ICD-10-CM | POA: Diagnosis present

## 2022-10-09 DIAGNOSIS — I130 Hypertensive heart and chronic kidney disease with heart failure and stage 1 through stage 4 chronic kidney disease, or unspecified chronic kidney disease: Secondary | ICD-10-CM | POA: Diagnosis present

## 2022-10-09 DIAGNOSIS — Z9049 Acquired absence of other specified parts of digestive tract: Secondary | ICD-10-CM

## 2022-10-09 DIAGNOSIS — I252 Old myocardial infarction: Secondary | ICD-10-CM | POA: Diagnosis not present

## 2022-10-09 DIAGNOSIS — D72820 Lymphocytosis (symptomatic): Secondary | ICD-10-CM | POA: Diagnosis not present

## 2022-10-09 DIAGNOSIS — N1832 Chronic kidney disease, stage 3b: Secondary | ICD-10-CM | POA: Diagnosis present

## 2022-10-09 DIAGNOSIS — E785 Hyperlipidemia, unspecified: Secondary | ICD-10-CM | POA: Diagnosis present

## 2022-10-09 DIAGNOSIS — I5042 Chronic combined systolic (congestive) and diastolic (congestive) heart failure: Secondary | ICD-10-CM | POA: Diagnosis present

## 2022-10-09 DIAGNOSIS — I129 Hypertensive chronic kidney disease with stage 1 through stage 4 chronic kidney disease, or unspecified chronic kidney disease: Secondary | ICD-10-CM | POA: Diagnosis not present

## 2022-10-09 DIAGNOSIS — I10 Essential (primary) hypertension: Secondary | ICD-10-CM | POA: Diagnosis present

## 2022-10-09 DIAGNOSIS — F32A Depression, unspecified: Secondary | ICD-10-CM | POA: Diagnosis present

## 2022-10-09 DIAGNOSIS — Y92009 Unspecified place in unspecified non-institutional (private) residence as the place of occurrence of the external cause: Secondary | ICD-10-CM

## 2022-10-09 DIAGNOSIS — E860 Dehydration: Secondary | ICD-10-CM | POA: Diagnosis present

## 2022-10-09 DIAGNOSIS — R197 Diarrhea, unspecified: Secondary | ICD-10-CM

## 2022-10-09 DIAGNOSIS — Z85038 Personal history of other malignant neoplasm of large intestine: Secondary | ICD-10-CM | POA: Diagnosis not present

## 2022-10-09 DIAGNOSIS — Z79899 Other long term (current) drug therapy: Secondary | ICD-10-CM

## 2022-10-09 DIAGNOSIS — Z7984 Long term (current) use of oral hypoglycemic drugs: Secondary | ICD-10-CM

## 2022-10-09 DIAGNOSIS — R112 Nausea with vomiting, unspecified: Secondary | ICD-10-CM | POA: Diagnosis present

## 2022-10-09 DIAGNOSIS — N3289 Other specified disorders of bladder: Secondary | ICD-10-CM | POA: Diagnosis not present

## 2022-10-09 DIAGNOSIS — S0081XA Abrasion of other part of head, initial encounter: Secondary | ICD-10-CM | POA: Diagnosis present

## 2022-10-09 DIAGNOSIS — Z886 Allergy status to analgesic agent status: Secondary | ICD-10-CM

## 2022-10-09 DIAGNOSIS — Z905 Acquired absence of kidney: Secondary | ICD-10-CM

## 2022-10-09 DIAGNOSIS — F418 Other specified anxiety disorders: Secondary | ICD-10-CM | POA: Diagnosis present

## 2022-10-09 DIAGNOSIS — R9082 White matter disease, unspecified: Secondary | ICD-10-CM | POA: Diagnosis not present

## 2022-10-09 DIAGNOSIS — Z91199 Patient's noncompliance with other medical treatment and regimen due to unspecified reason: Secondary | ICD-10-CM

## 2022-10-09 DIAGNOSIS — Z8249 Family history of ischemic heart disease and other diseases of the circulatory system: Secondary | ICD-10-CM

## 2022-10-09 DIAGNOSIS — Z8719 Personal history of other diseases of the digestive system: Secondary | ICD-10-CM

## 2022-10-09 DIAGNOSIS — I959 Hypotension, unspecified: Secondary | ICD-10-CM | POA: Diagnosis not present

## 2022-10-09 LAB — COMPREHENSIVE METABOLIC PANEL
ALT: 19 U/L (ref 0–44)
AST: 27 U/L (ref 15–41)
Albumin: 3.3 g/dL — ABNORMAL LOW (ref 3.5–5.0)
Alkaline Phosphatase: 52 U/L (ref 38–126)
Anion gap: 14 (ref 5–15)
BUN: 44 mg/dL — ABNORMAL HIGH (ref 8–23)
CO2: 18 mmol/L — ABNORMAL LOW (ref 22–32)
Calcium: 8.1 mg/dL — ABNORMAL LOW (ref 8.9–10.3)
Chloride: 97 mmol/L — ABNORMAL LOW (ref 98–111)
Creatinine, Ser: 5.65 mg/dL — ABNORMAL HIGH (ref 0.61–1.24)
GFR, Estimated: 10 mL/min — ABNORMAL LOW (ref >60)
Glucose, Bld: 92 mg/dL (ref 70–99)
Potassium: 4.1 mmol/L (ref 3.5–5.1)
Sodium: 129 mmol/L — ABNORMAL LOW (ref 135–145)
Total Bilirubin: 0.5 mg/dL (ref 0.3–1.2)
Total Protein: 5.7 g/dL — ABNORMAL LOW (ref 6.5–8.1)

## 2022-10-09 LAB — CBC WITH DIFFERENTIAL/PLATELET
Abs Immature Granulocytes: 0.05 10*3/uL (ref 0.00–0.07)
Basophils Absolute: 0.1 10*3/uL (ref 0.0–0.1)
Basophils Relative: 0 %
Eosinophils Absolute: 0.4 10*3/uL (ref 0.0–0.5)
Eosinophils Relative: 3 %
HCT: 29.4 % — ABNORMAL LOW (ref 39.0–52.0)
Hemoglobin: 9.2 g/dL — ABNORMAL LOW (ref 13.0–17.0)
Immature Granulocytes: 0 %
Lymphocytes Relative: 29 %
Lymphs Abs: 3.8 10*3/uL (ref 0.7–4.0)
MCH: 25.9 pg — ABNORMAL LOW (ref 26.0–34.0)
MCHC: 31.3 g/dL (ref 30.0–36.0)
MCV: 82.8 fL (ref 80.0–100.0)
Monocytes Absolute: 1.7 10*3/uL — ABNORMAL HIGH (ref 0.1–1.0)
Monocytes Relative: 13 %
Neutro Abs: 7.1 10*3/uL (ref 1.7–7.7)
Neutrophils Relative %: 55 %
Platelets: 227 10*3/uL (ref 150–400)
RBC: 3.55 MIL/uL — ABNORMAL LOW (ref 4.22–5.81)
RDW: 19.8 % — ABNORMAL HIGH (ref 11.5–15.5)
WBC: 13.1 10*3/uL — ABNORMAL HIGH (ref 4.0–10.5)
nRBC: 0 % (ref 0.0–0.2)

## 2022-10-09 LAB — TYPE AND SCREEN
ABO/RH(D): O POS
Antibody Screen: NEGATIVE

## 2022-10-09 LAB — PROTIME-INR
INR: 1.2 (ref 0.8–1.2)
Prothrombin Time: 15.1 seconds (ref 11.4–15.2)

## 2022-10-09 LAB — URINALYSIS, ROUTINE W REFLEX MICROSCOPIC
Bilirubin Urine: NEGATIVE
Glucose, UA: 50 mg/dL — AB
Hgb urine dipstick: NEGATIVE
Ketones, ur: NEGATIVE mg/dL
Nitrite: NEGATIVE
Protein, ur: NEGATIVE mg/dL
Specific Gravity, Urine: 1.01 (ref 1.005–1.030)
pH: 5 (ref 5.0–8.0)

## 2022-10-09 LAB — HEMOGLOBIN AND HEMATOCRIT, BLOOD
HCT: 32.8 % — ABNORMAL LOW (ref 39.0–52.0)
Hemoglobin: 10.4 g/dL — ABNORMAL LOW (ref 13.0–17.0)

## 2022-10-09 MED ORDER — ROSUVASTATIN CALCIUM 20 MG PO TABS
20.0000 mg | ORAL_TABLET | Freq: Every day | ORAL | Status: DC
  Administered 2022-10-09 – 2022-10-12 (×4): 20 mg via ORAL
  Filled 2022-10-09 (×4): qty 1

## 2022-10-09 MED ORDER — BUPROPION HCL ER (XL) 150 MG PO TB24
150.0000 mg | ORAL_TABLET | Freq: Every day | ORAL | Status: DC
  Administered 2022-10-09 – 2022-10-12 (×4): 150 mg via ORAL
  Filled 2022-10-09 (×4): qty 1

## 2022-10-09 MED ORDER — PAROXETINE HCL 30 MG PO TABS
30.0000 mg | ORAL_TABLET | Freq: Every day | ORAL | Status: DC
  Administered 2022-10-10 – 2022-10-12 (×3): 30 mg via ORAL
  Filled 2022-10-09 (×4): qty 1

## 2022-10-09 MED ORDER — ONDANSETRON HCL 4 MG/2ML IJ SOLN
4.0000 mg | Freq: Four times a day (QID) | INTRAMUSCULAR | Status: DC | PRN
  Administered 2022-10-09: 4 mg via INTRAVENOUS
  Filled 2022-10-09: qty 2

## 2022-10-09 MED ORDER — AMIODARONE HCL 200 MG PO TABS
100.0000 mg | ORAL_TABLET | Freq: Every day | ORAL | Status: DC
  Administered 2022-10-09 – 2022-10-12 (×4): 100 mg via ORAL
  Filled 2022-10-09 (×4): qty 1

## 2022-10-09 MED ORDER — SODIUM CHLORIDE 0.9% FLUSH
3.0000 mL | Freq: Two times a day (BID) | INTRAVENOUS | Status: DC
  Administered 2022-10-09 – 2022-10-12 (×5): 3 mL via INTRAVENOUS

## 2022-10-09 MED ORDER — IOHEXOL 9 MG/ML PO SOLN
500.0000 mL | ORAL | Status: AC
  Administered 2022-10-09 (×2): 500 mL via ORAL

## 2022-10-09 MED ORDER — ACETAMINOPHEN 650 MG RE SUPP: 650.0000 mg | Freq: Four times a day (QID) | RECTAL | Status: DC | PRN

## 2022-10-09 MED ORDER — ACETAMINOPHEN 325 MG PO TABS
650.0000 mg | ORAL_TABLET | Freq: Four times a day (QID) | ORAL | Status: DC | PRN
  Administered 2022-10-11: 650 mg via ORAL
  Filled 2022-10-09: qty 2

## 2022-10-09 MED ORDER — ALBUTEROL SULFATE (2.5 MG/3ML) 0.083% IN NEBU: 2.5000 mg | INHALATION_SOLUTION | Freq: Four times a day (QID) | RESPIRATORY_TRACT | Status: DC | PRN

## 2022-10-09 MED ORDER — IOHEXOL 9 MG/ML PO SOLN: 500.0000 mL | ORAL | Status: DC

## 2022-10-09 MED ORDER — SODIUM CHLORIDE 0.9 % IV BOLUS
1000.0000 mL | Freq: Once | INTRAVENOUS | Status: AC
  Administered 2022-10-09: 1000 mL via INTRAVENOUS

## 2022-10-09 MED ORDER — SODIUM CHLORIDE 0.9 % IV SOLN: INTRAVENOUS | Status: DC

## 2022-10-09 MED ORDER — SACCHAROMYCES BOULARDII 250 MG PO CAPS
250.0000 mg | ORAL_CAPSULE | Freq: Two times a day (BID) | ORAL | Status: DC
  Administered 2022-10-09 – 2022-10-12 (×6): 250 mg via ORAL
  Filled 2022-10-09 (×7): qty 1

## 2022-10-09 MED ORDER — QUETIAPINE FUMARATE 25 MG PO TABS
25.0000 mg | ORAL_TABLET | Freq: Every day | ORAL | Status: DC
  Administered 2022-10-09 – 2022-10-11 (×3): 25 mg via ORAL
  Filled 2022-10-09 (×3): qty 1

## 2022-10-09 NOTE — ED Triage Notes (Signed)
Pt BIB EMS after falling at 2am at home, hit head on wall with small abrasion to back of head. C/o leg weakness for the past 24 hours and legs giving out when he tries to walk. Pt on Eliquis.

## 2022-10-09 NOTE — ED Notes (Signed)
Pt taken to CT.

## 2022-10-09 NOTE — Telephone Encounter (Signed)
He is currently admitted to the hospital after presenting with frequent falls. He was found to be in AKI and hemoglobin slightly lower than before. I would hold off on providing any refills right now and see what happens during current hospitalization (just in case any medication changes are made).  Thanks!

## 2022-10-09 NOTE — H&P (Addendum)
History and Physical    Patient: Brendan Butler WUJ:[id number] DOB: [birth year] DOA: 10/09/2022 DOS: the patient was seen and examined on 10/09/2022 PCP: [name], MD  Patient coming from: Home lives alone via   Chief Complaint:  Chief Complaint  Patient presents with   Fall   Weakness   HPI: Brendan Butler is a 72 y.o. male with medical history significant of hypertension, hyperlipidemia, atrial fibrillation on Xarelto, HFpEF, adenocarcinoma of the colon s/p hemicolectomy 6/30, and OSA who presents after having a fall at home.  He states that he got up around 2 AM this morning to use the bathroom when he lost his footing on a Styrofoam cup that was on the floor.  He states that his legs felt like rubber and he fell forward hitting his head.  Denies any loss of consciousness.  He had been feeling lightheaded with changes in position.  He had been hospitalized 7/15-7/18 with sepsis thought secondary to colitis discharged home on Augmentin. Thereafter he was just recently admitted into the hospital from 8/1-8/4 with diverticulitis of the descending colon with AKI and sent home to complete 10 days of antibiotics with ciprofloxacin and metronidazole.  During hospitalization medication like Entresto, spironolactone, and furosemide have been held, but were resumed at discharge.  Notes associated symptoms of nausea, episodes of vomiting yesterday, subjective fevers, and continued diarrhea.  Denies having chest pain abdominal pain, shortness of breath, blood in stools, or significant leg swelling.  Makes note that his daughter organizes his meds and he takes them.  He makes note that he is on a couple diuretic medications.   Daughter calls and makes note that yesterday she had went to go check on him and he had fallen, but had fallen multiple other times overnight as well at the disarray of the house.  She states that he has been back and forth to the hospital and likely needs to go to inpatient  rehab or some kind of rehabilitation prior to being sent home because he is not able to care for himself.  In the emergency department patient was noted to be afebrile with heart rates 51-62, blood pressures noted to be soft 98/54, and all other vital signs maintained.  CT scan of the head and cervical spine did not note any acute abnormalities.  Labs significant for WBC 13.1, hemoglobin 9.2, CO2 18, sodium 129, BUN 44, and creatinine 5.65.  Patient had been given 1 L of normal saline IV fluids.  Review of Systems: As mentioned in the history of present illness. All other systems reviewed and are negative. Past Medical History:  Diagnosis Date   Basal ganglia infarction (HCC)    Bradycardia    CAD (coronary artery disease)    Chronic HFrEF (heart failure with reduced ejection fraction) (HCC)    Chronic kidney disease, stage 3 (HCC)    Colon cancer (HCC) 08/26/2022   Erectile dysfunction    Hyperlipidemia LDL goal <70    Hypertension    OSA (obstructive sleep apnea)    No CPAP use overnight   PAF (paroxysmal atrial fibrillation) (HCC)    Paroxysmal atrial flutter (HCC)    Premature atrial contractions    Pulmonary nodules    Past Surgical History:  Procedure Laterality Date   BACK SURGERY  2007   Decompression lumbar laminectomy and micro discectomy, L4-5   BIOPSY  08/20/2022   Procedure: BIOPSY;  Surgeon: Vida Rigger, MD;  Location: [facility] ENDOSCOPY;  Service: Gastroenterology;;   BRONCHIAL BIOPSY  12/28/2021   Procedure: BRONCHIAL BIOPSIES;  Surgeon: Leslye Peer, MD;  Location: [facility] ENDOSCOPY;  Service: Pulmonary;;   BRONCHIAL BRUSHINGS  12/28/2021   Procedure: BRONCHIAL BRUSHINGS;  Surgeon: Leslye Peer, MD;  Location[facility]OSCOPY;  Service: Pulmonary;;   BRONCHIAL NEEDLE ASPIRATION BIOPSY  12/28/2021   Procedure: BRONCHIAL NEEDLE ASPIRATION BIOPSIES;  Surgeon: Leslye Peer, MD;  Location: [facility] ENDOSCOPY;  Service: Pulmonary;;   BRONCHIAL WASHINGS  12/28/2021   Procedure:  BRONCHIAL WASHINGS;  Surgeon: Leslye Peer, MD;  Location: [facility] ENDOSCOPY;  Service: Pulmonary;;   CARDIAC CATHETERIZATION  2007   patent stent to LAD and normal Cors   CARDIOVERSION N/A 05/10/2022   Procedure: CARDIOVERSION;  Surgeon: Sande Rives, MD;  Location: [facility] ENDOSCOPY;  Service: Cardiovascular;  Laterality: N/A;   CARDIOVERSION N/A 05/28/2022   Procedure: CARDIOVERSION;  Surgeon: Wendall Stade, MD;  Location: [facility] ENDOSCOPY;  Service: Cardiovascular;  Laterality: N/A;   CHOLECYSTECTOMY N/A 12/26/2021   Procedure: LAPAROSCOPIC CHOLECYSTECTOMY, LAPAROSCOPIC LYSIS OF ADHESIONS GREATER THAN 1 HOUR;  Surgeon: [name], MD;  Location: [facility] OR;  Service: General;  Laterality: N/A;   COLONOSCOPY WITH PROPOFOL N/A 08/20/2022   Procedure: COLONOSCOPY WITH PROPOFOL;  Surgeon: Vida Rigger, MD;  Location: [facility] ENDOSCOPY;  Service: Gastroenterology;  Laterality: N/A;   CORONARY ANGIOPLASTY WITH STENT PLACEMENT  2001   to LAD   CORONARY ANGIOPLASTY WITH STENT PLACEMENT  12/29/12   STEMI with promus DES to LAD   CORONARY ANGIOPLASTY WITH STENT PLACEMENT  01/05/13   STEMI with thrombosis in stent to LAD   ESOPHAGOGASTRODUODENOSCOPY (EGD) WITH PROPOFOL N/A 08/20/2022   Procedure: ESOPHAGOGASTRODUODENOSCOPY (EGD) WITH PROPOFOL;  Surgeon: Vida Rigger, MD;  Location: [facility] ENDOSCOPY;  Service: Gastroenterology;  Laterality: N/A;   FIDUCIAL MARKER PLACEMENT  12/28/2021   Procedure: FIDUCIAL MARKER PLACEMENT;  Surgeon: Leslye Peer, MD;  Location: [facility] ENDOSCOPY;  Service: Pulmonary;;   LAPAROSCOPIC PARTIAL COLECTOMY Right 08/23/2022   Procedure: LAPAROSCOPIC CONVERTED TO OPEN HEMICOLECTOMY;  Surgeon: Axel Filler, MD;  Location[facility] OR;  Service: General;  Laterality: Right;   LEFT HEART CATH N/A 12/29/2012   Procedure: LEFT HEART CATH;  Surgeon: Corky Crafts, MD;  Location: [facility] CATH LAB;  Service: Cardiovascular;  Laterality: N/A;   LEFT HEART CATHETERIZATION WITH CORONARY ANGIOGRAM N/A  01/05/2013   Procedure: LEFT HEART CATHETERIZATION WITH CORONARY ANGIOGRAM;  Surgeon: Micheline Chapman, MD;  Location: [facility] CATH LAB;  Service: Cardiovascular;  Laterality: N/A;   LEFT HEART CATHETERIZATION WITH CORONARY ANGIOGRAM N/A 04/22/2013   Procedure: LEFT HEART CATHETERIZATION WITH CORONARY ANGIOGRAM;  Surgeon: Lennette Bihari, MD;  Location: [facility] CATH LAB;  Service: Cardiovascular;  Laterality: N/A;   PARTIAL NEPHRECTOMY Left 1975   PATIENT ONLY HAS ONE KIDNEY   PERCUTANEOUS CORONARY STENT INTERVENTION (PCI-S)  12/29/2012   Procedure: PERCUTANEOUS CORONARY STENT INTERVENTION (PCI-S);  Surgeon: Corky Crafts, MD;  Location: [facility] CATH LAB;  Service: Cardiovascular;;   SUBMUCOSAL TATTOO INJECTION  08/20/2022   Procedure: SUBMUCOSAL TATTOO INJECTION;  Surgeon: Vida Rigger, MD;  Location: [facility] ENDOSCOPY;  Service: Gastroenterology;;   TEE WITHOUT CARDIOVERSION N/A 05/10/2022   Procedure: TRANSESOPHAGEAL ECHOCARDIOGRAM (TEE);  Surgeon: Sande Rives, MD;  Location: [facility] ENDOSCOPY;  Service: Cardiovascular;  Laterality: N/A;   TEE WITHOUT CARDIOVERSION N/A 05/28/2022   Procedure: TRANSESOPHAGEAL ECHOCARDIOGRAM (TEE);  Surgeon: Wendall Stade, MD;  Location: [facility] ENDOSCOPY;  Service: Cardiovascular;  Laterality: N/A;   Social History:  reports that he quit smoking about 23 years ago.  His smoking use included cigarettes. He started smoking about 48 years ago. He has a 50 pack-year smoking history. His smokeless tobacco use includes chew. He reports that he does not currently use alcohol. He reports current drug use. Drug: Marijuana.  Allergies  Allergen Reactions   Levitra [Vardenafil] Other (See Comments)    Blurred vision   Aspirin Hives   Lipitor [Atorvastatin] Other (See Comments)    Muscle cramps; patient states he does not take medication at home    Family History  Problem Relation Age of Onset   Alzheimer's disease Mother    CAD Father     Prior to Admission medications    Medication Sig Start Date End Date Taking? Authorizing Provider  acetaminophen (TYLENOL) 500 MG tablet Take 2 tablets (1,000 mg total) by mouth every 6 (six) hours. 08/28/22   Swayze, Ava, DO  amiodarone (PACERONE) 200 MG tablet Take 1/2 tablet (100 mg total) by mouth daily. 08/29/22   Swayze, Ava, DO  buPROPion (WELLBUTRIN XL) 150 MG 24 hr tablet Take 1 tablet (150 mg total) by mouth daily. 05/24/22   Elgergawy, Leana Roe, MD  empagliflozin (JARDIANCE) 10 MG TABS tablet Take 1 tablet (10 mg total) by mouth daily. 06/18/22   Marjie Skiff E, PA-C  ferrous sulfate 325 (65 FE) MG EC tablet Take 1 tablet (325 mg total) by mouth 2 (two) times daily. 07/31/22 09/27/22  Jerald Kief, MD  fluticasone (FLONASE) 50 MCG/ACT nasal spray Place 1 spray into both nostrils daily as needed for allergies. 12/24/21   [provider]  furosemide (LASIX) 40 MG tablet Take 1 tablet (40 mg total) by mouth 2 (two) times daily. 08/28/22   Swayze, Ava, DO  lactose free nutrition (BOOST PLUS) LIQD Take 237 mLs by mouth 2 (two) times daily between meals. 08/29/22   Swayze, Ava, DO  methocarbamol (ROBAXIN) 500 MG tablet Take 1 tablet (500 mg total) by mouth every 8 (eight) hours as needed for muscle spasms. 08/28/22   Swayze, Ava, DO  Multiple Vitamin (MULTIVITAMIN WITH MINERALS) TABS tablet Take 1 tablet by mouth daily. 08/29/22   Swayze, Ava, DO  ondansetron (ZOFRAN) 4 MG tablet Take 1 tablet (4 mg total) by mouth every 8 (eight) hours as needed for nausea or vomiting. 09/13/22 09/13/23  Rai, Ripudeep K, MD  oxyCODONE (OXY IR/ROXICODONE) 5 MG immediate release tablet Take 1 tablet (5 mg total) by mouth every 6 (six) hours as needed for moderate pain. 08/28/22   Swayze, Ava, DO  pantoprazole (PROTONIX) 40 MG tablet Take 1 tablet (40 mg total) by mouth daily. 05/11/22   Pokhrel, Rebekah Chesterfield, MD  PARoxetine (PAXIL) 30 MG tablet Take 30 mg by mouth daily.    [provider]  potassium chloride SA (KLOR-CON M) 20 MEQ tablet Take 2  tablets (40 mEq total) by mouth daily. 08/28/22   Swayze, Ava, DO  pregabalin (LYRICA) 150 MG capsule Take 1 capsule (150 mg total) by mouth 2 (two) times daily. 05/24/22   Elgergawy, Leana Roe, MD  QUEtiapine (SEROQUEL) 25 MG tablet Take 1 tablet (25 mg total) by mouth at bedtime. 05/10/22   Pokhrel, Rebekah Chesterfield, MD  Rivaroxaban (XARELTO) 15 MG TABS tablet Take 1 tablet (15 mg total) by mouth daily with supper. 08/28/22   Swayze, Ava, DO  rosuvastatin (CRESTOR) 20 MG tablet Take 1 tablet (20 mg total) by mouth daily. 06/18/22   Marjie Skiff E, PA-C  sacubitril-valsartan (ENTRESTO) 24-26 MG Take 1 tablet by mouth 2 (two) times daily.  06/18/22   Corrin Parker, PA-C  spironolactone (ALDACTONE) 25 MG tablet Take 1/2 tablet (12.5 mg total) by mouth daily. 06/18/22   Corrin Parker, PA-C    Physical Exam: Vitals:   10/09/22 1328 10/09/22 1329 10/09/22 1515 10/09/22 1515  BP:  (!) 98/54 (!) 104/57   Pulse:   (!) 51   Resp:   12   Temp:    (!) 97.5 F (36.4 C)  TempSrc:    Oral  SpO2:   96%   Weight: 88.6 kg     Height: 5\' 11"  (1.803 m)       Constitutional: Elderly male appears to be in no acute distress. Eyes: PERRL, lids and conjunctivae normal ENMT: Mucous membranes are dry.  Poor dentition. Neck: normal, supple, no masses, no thyromegaly Respiratory: clear to auscultation bilaterally, no wheezing, no crackles. Normal respiratory effort. No accessory muscle use.  Cardiovascular: Regular rate No extremity edema. 2+ pedal pulses. No carotid bruits.  Abdomen: no tenderness, no masses palpated.  Bowel sounds positive.  Musculoskeletal: no clubbing / cyanosis. No joint deformity upper and lower extremities. Good ROM, no contractures. Normal muscle tone.  Skin: Healing midline scar of the abdomen without drainage appreciated. Neurologic: CN 2-12 grossly intact. Strength 5/5 in all 4.  Psychiatric:    Alert and oriented x 3. Normal mood.   Data Reviewed:  Orders placed to check EKG.   Reviewed labs, imaging, and pertinent records as noted above in HPI  Assessment and Plan:  Acute kidney injury superimposed on chronic kidney disease stage III Patient presents with creatinine elevated up to 5.65 with a BUN 44.  Baseline creatinine previously had been 1.73 when last discharge from hospital 10 days ago.   -Admit to a medical telemetry bed -Strict I&Os and daily weights -Holding possible nephrotoxic agents -Continue normal saline IV fluids at 100 mL/h -Recheck kidney function in a.m. -Likely will need to have adjustments to his medication regimen at discharge.  Leukocytosis Acute.  Patient reported having subjective fevers at home prior to arrival.  WBC elevate at 13.1.  Chest x-ray showed no acute abnormality.  Urinalysis noted trace leukocytes, rare bacteria, and 6-10 WBCs. -Follow-up urine culture -Check C. Difficile -Will continue to monitor and reassess need of antibiotics as no clear source appreciated at this time. -Check repeat CBC tomorrow morning  Nausea, vomiting, diarrhea Patient reports still reports intermittently having nausea, vomiting, and diarrhea.  Symptoms previously thought secondary to colitis and subsequently diverticulitis in thje last 2 hospitalizations since last month for which patient was treated with antibiotics.  He denies any complaints of abdominal pain at this time. -Aspiration precautions with elevation head of bed -Check C. difficile given recent antibiotics if diarrhea persist -Check CT scan of the abdomen pelvis -Protonix IV  -Probiotics  Hyponatremia Acute.  Sodium 129.  Thought to be hypovolemic hyponatremia given nausea, vomiting, diarrhea, and diuretic use. -IV fluids as noted above -Recheck sodium levels in a.m.  Hypochromic Anemia  Acute on chronic. Hemoglobin noted to be 9.2, but previously had been 10.5 at discharge on 8/3.  Patient did not report any complaints of bleeding.  He is on anticoagulation of Xarelto. -Type and  screen for possible need of blood products -Recheck H&H  Falls at home Patient fallen once at home this morning, but daughter thinks that he fell multiple times overnight.  He denies any loss of consciousness, but did report having episodes of dizziness.  Daughter  does not feel safe with him  going home at this time.  No acute intracranial injury appreciated. -Follow-up orthostatic vital signs -PT/OT to evaluate and treat  Essential hypertension On admission blood pressures noted to be as low as 98/54.  Patient reported feeling lightheaded when getting up and moving around.  Suspect patient likely needs adjustment to diuretic regimen. -Held home blood pressure medications of Entresto, spironolactone, and furosemide  CAD Dyslipidemia Patient with a prior history of PCI.  Does not report any complaints of chest pain. -Continue Crestor  Atrial flutter on chronic anticoagulation -Continue amiodarone  Heart failure with preserved EF Chronic.  Patient does not have a lower extremity swelling or signs of JVD on physical exam to suggest he is fluid overloaded.  Last echocardiogram noted EF to be around 50% with normal diastolic parameters on 08/17/2022. -Strict I&Os and daily weights -Held Jardiance due to AKI as well as other medications as listed above  Depression and anxiety -Continue Wellbutrin, Paxil and Seroquel  History of colon cancer Patient status post right hemicolectomy with ileostomy anastomosis by Dr. Derrell Lolling on 08/23/2022 for right colon mass.  Pathology revealed invasive well to moderately differentiated adenocarcinoma. -Patient needs to follow-up in outpatient setting with Dr. Truett Perna after previous appointment on 8/1 had to be rescheduled as he was in the hospital.  OSA   Patient reports that he is not on CPAP at night.  GERD -Protonix  DVT prophylaxis: Initially held Xarelto Advance Care Planning:   Code Status: Full Code    Consults: None  Family Communication:  Called daughter and left voicemail  Severity of Illness: The appropriate patient status for this patient is INPATIENT. Inpatient status is judged to be reasonable and necessary in order to provide the required intensity of service to ensure the patient's safety. The patient's presenting symptoms, physical exam findings, and initial radiographic and laboratory data in the context of their chronic comorbidities is felt to place them at high risk for further clinical deterioration. Furthermore, it is not anticipated that the patient will be medically stable for discharge from the hospital within 2 midnights of admission.   * I certify that at the point of admission it is my clinical judgment that the patient will require inpatient hospital care spanning beyond 2 midnights from the point of admission due to high intensity of service, high risk for further deterioration and high frequency of surveillance required.*  Author: Clydie Braun, MD 10/09/2022 3:39 PM  For on call review [email or web address].

## 2022-10-09 NOTE — ED Notes (Signed)
ED TO INPATIENT HANDOFF REPORT  ED Nurse Name and Phone #: Leeroy Bock [phone]  S Name/Age/Gender Brendan Butler 72 y.o. male Room/Bed: 019C/019C  Code Status   Code Status: Prior  Home/SNF/Other Home Patient oriented to: self, place, time, and situation Is this baseline? Yes   Triage Complete: Triage complete  Chief Complaint Acute kidney injury superimposed on chronic kidney disease (HCC) [N17.9, N18.9]  Triage Note Pt BIB EMS after falling at 2am at home, hit head on wall with small abrasion to back of head. C/o leg weakness for the past 24 hours and legs giving out when he tries to walk. Pt on Eliquis.    Allergies Allergies  Allergen Reactions   Levitra [Vardenafil] Other (See Comments)    Blurred vision   Aspirin Hives   Lipitor [Atorvastatin] Other (See Comments)    Muscle cramps; patient states he does not take medication at home    Level of Care/Admitting Diagnosis ED Disposition     ED Disposition  Admit   Condition  --   Comment  Hospital Area: [facility] [[id number]]  Level of Care: Telemetry Medical [104]  May admit patient to [facility] or [facility] if equivalent level of care is available:: No  Covid Evaluation: Asymptomatic - no recent exposure (last 10 days) testing not required  Diagnosis: Acute kidney injury superimposed on chronic kidney disease [facility]) [[id number]]  Admitting Physician: Clydie Braun [[id number]]  Attending Physician: Clydie Braun [[id number]]  Certification:: I certify this patient will need inpatient services for at least 2 midnights  Expected Medical Readiness: 10/11/2022          B Medical/Surgery History Past Medical History:  Diagnosis Date   Basal ganglia infarction (HCC)    Bradycardia    CAD (coronary artery disease)    Chronic HFrEF (heart failure with reduced ejection fraction) (HCC)    Chronic kidney disease, stage 3 (HCC)    Colon cancer (HCC) 08/26/2022   Erectile dysfunction     Hyperlipidemia LDL goal <70    Hypertension    OSA (obstructive sleep apnea)    No CPAP use overnight   PAF (paroxysmal atrial fibrillation) (HCC)    Paroxysmal atrial flutter (HCC)    Premature atrial contractions    Pulmonary nodules    Past Surgical History:  Procedure Laterality Date   BACK SURGERY  2007   Decompression lumbar laminectomy and micro discectomy, L4-5   BIOPSY  08/20/2022   Procedure: BIOPSY;  Surgeon: Vida Rigger, MD;  Location: [facility] ENDOSCOPY;  Service: Gastroenterology;;   BRONCHIAL BIOPSY  12/28/2021   Procedure: BRONCHIAL BIOPSIES;  Surgeon: Leslye Peer, MD;  Location: [facility] ENDOSCOPY;  Service: Pulmonary;;   BRONCHIAL BRUSHINGS  12/28/2021   Procedure: BRONCHIAL BRUSHINGS;  Surgeon: Leslye Peer, MD;  Location: [facility] ENDOSCOPY;  Service: Pulmonary;;   BRONCHIAL NEEDLE ASPIRATION BIOPSY  12/28/2021   Procedure: BRONCHIAL NEEDLE ASPIRATION BIOPSIES;  Surgeon: Leslye Peer, MD;  Location: [facility] ENDOSCOPY;  Service: Pulmonary;;   BRONCHIAL WASHINGS  12/28/2021   Procedure: BRONCHIAL WASHINGS;  Surgeon: Leslye Peer, MD;  Location: [facility] ENDOSCOPY;  Service: Pulmonary;;   CARDIAC CATHETERIZATION  2007   patent stent to LAD and normal Cors   CARDIOVERSION N/A 05/10/2022   Procedure: CARDIOVERSION;  Surgeon: Sande Rives, MD;  Location: [facility] ENDOSCOPY;  Service: Cardiovascular;  Laterality: N/A;   CARDIOVERSION N/A 05/28/2022   Procedure: CARDIOVERSION;  Surgeon: Wendall Stade, MD;  Location: [facility] ENDOSCOPY;  Service: Cardiovascular;  Laterality: N/A;   CHOLECYSTECTOMY N/A 12/26/2021   Procedure: LAPAROSCOPIC CHOLECYSTECTOMY, LAPAROSCOPIC LYSIS OF ADHESIONS GREATER THAN 1 HOUR;  Surgeon: [name], MD;  Location: [facility] OR;  Service: General;  Laterality: N/A;   COLONOSCOPY WITH PROPOFOL N/A 08/20/2022   Procedure: COLONOSCOPY WITH PROPOFOL;  Surgeon: Vida Rigger, MD;  Location: [facility] ENDOSCOPY;  Service: Gastroenterology;  Laterality: N/A;   CORONARY ANGIOPLASTY WITH  STENT PLACEMENT  2001   to LAD   CORONARY ANGIOPLASTY WITH STENT PLACEMENT  12/29/12   STEMI with promus DES to LAD   CORONARY ANGIOPLASTY WITH STENT PLACEMENT  01/05/13   STEMI with thrombosis in stent to LAD   ESOPHAGOGASTRODUODENOSCOPY (EGD) WITH PROPOFOL N/A 08/20/2022   Procedure: ESOPHAGOGASTRODUODENOSCOPY (EGD) WITH PROPOFOL;  Surgeon: Vida Rigger, MD;  Location: [facility] ENDOSCOPY;  Service: Gastroenterology;  Laterality: N/A;   FIDUCIAL MARKER PLACEMENT  12/28/2021   Procedure: FIDUCIAL MARKER PLACEMENT;  Surgeon: Leslye Peer, MD;  Location: [facility] ENDOSCOPY;  Service: Pulmonary;;   LAPAROSCOPIC PARTIAL COLECTOMY Right 08/23/2022   Procedure: LAPAROSCOPIC CONVERTED TO OPEN HEMICOLECTOMY;  Surgeon: Axel Filler, MD;  Location: [facility] OR;  Service: General;  Laterality: Right;   LEFT HEART CATH N/A 12/29/2012   Procedure: LEFT HEART CATH;  Surgeon: Corky Crafts, MD;  Location: [facility] CATH LAB;  Service: Cardiovascular;  Laterality: N/A;   LEFT HEART CATHETERIZATION WITH CORONARY ANGIOGRAM N/A 01/05/2013   Procedure: LEFT HEART CATHETERIZATION WITH CORONARY ANGIOGRAM;  Surgeon: Micheline Chapman, MD;  Location: [facility] CATH LAB;  Service: Cardiovascular;  Laterality: N/A;   LEFT HEART CATHETERIZATION WITH CORONARY ANGIOGRAM N/A 04/22/2013   Procedure: LEFT HEART CATHETERIZATION WITH CORONARY ANGIOGRAM;  Surgeon: Lennette Bihari, MD;  Location: [facility] CATH LAB;  Service: Cardiovascular;  Laterality: N/A;   PARTIAL NEPHRECTOMY Left 1975   PATIENT ONLY HAS ONE KIDNEY   PERCUTANEOUS CORONARY STENT INTERVENTION (PCI-S)  12/29/2012   Procedure: PERCUTANEOUS CORONARY STENT INTERVENTION (PCI-S);  Surgeon: Corky Crafts, MD;  Location: [facility] CATH LAB;  Service: Cardiovascular;;   SUBMUCOSAL TATTOO INJECTION  08/20/2022   Procedure: SUBMUCOSAL TATTOO INJECTION;  Surgeon: Vida Rigger, MD;  Location: [facility] ENDOSCOPY;  Service: Gastroenterology;;   TEE WITHOUT CARDIOVERSION N/A 05/10/2022   Procedure: TRANSESOPHAGEAL  ECHOCARDIOGRAM (TEE);  Surgeon: Sande Rives, MD;  Location: [facility] ENDOSCOPY;  Service: Cardiovascular;  Laterality: N/A;   TEE WITHOUT CARDIOVERSION N/A 05/28/2022   Procedure: TRANSESOPHAGEAL ECHOCARDIOGRAM (TEE);  Surgeon: Wendall Stade, MD;  Location: [facility] ENDOSCOPY;  Service: Cardiovascular;  Laterality: N/A;     A IV Location/Drains/Wounds Patient Lines/Drains/Airways Status     Active Line/Drains/Airways     Name Placement date Placement time Site Days   Peripheral IV 10/09/22 20 G Anterior;Left Forearm 10/09/22  1400  Forearm  less than 1            Intake/Output Last 24 hours No intake or output data in the 24 hours ending 10/09/22 1554  Labs/Imaging Results for orders placed or performed during the hospital encounter of 10/09/22 (from the past 48 hour(s))  CBC with Differential     Status: Abnormal   Collection Time: 10/09/22  2:14 PM  Result Value Ref Range   WBC 13.1 (H) 4.0 - 10.5 K/uL   RBC 3.55 (L) 4.22 - 5.81 MIL/uL   Hemoglobin 9.2 (L) 13.0 - 17.0 g/dL   HCT 09.3 (L) 23.5 - 57.3 %   MCV 82.8 80.0 - 100.0 fL   MCH 25.9 (L) 26.0 - 34.0 pg   MCHC 31.3 30.0 -  36.0 g/dL   RDW 27.2 (H) 53.6 - 64.4 %   Platelets 227 150 - 400 K/uL   nRBC 0.0 0.0 - 0.2 %   Neutrophils Relative % 55 %   Neutro Abs 7.1 1.7 - 7.7 K/uL   Lymphocytes Relative 29 %   Lymphs Abs 3.8 0.7 - 4.0 K/uL   Monocytes Relative 13 %   Monocytes Absolute 1.7 (H) 0.1 - 1.0 K/uL   Eosinophils Relative 3 %   Eosinophils Absolute 0.4 0.0 - 0.5 K/uL   Basophils Relative 0 %   Basophils Absolute 0.1 0.0 - 0.1 K/uL   Immature Granulocytes 0 %   Abs Immature Granulocytes 0.05 0.00 - 0.07 K/uL    Comment: Performed at [facility] Lab, [address] [place], [place] [address]  Comprehensive metabolic panel     Status: Abnormal   Collection Time: 10/09/22  2:14 PM  Result Value Ref Range   Sodium 129 (L) 135 - 145 mmol/L   Potassium 4.1 3.5 - 5.1 mmol/L   Chloride 97 (L) 98 - 111 mmol/L    CO2 18 (L) 22 - 32 mmol/L   Glucose, Bld 92 70 - 99 mg/dL    Comment: Glucose reference range applies only to samples taken after fasting for at least 8 hours.   BUN 44 (H) 8 - 23 mg/dL   Creatinine, Ser 2.59 (H) 0.61 - 1.24 mg/dL   Calcium 8.1 (L) 8.9 - 10.3 mg/dL   Total Protein 5.7 (L) 6.5 - 8.1 g/dL   Albumin 3.3 (L) 3.5 - 5.0 g/dL   AST 27 15 - 41 U/L   ALT 19 0 - 44 U/L   Alkaline Phosphatase 52 38 - 126 U/L   Total Bilirubin 0.5 0.3 - 1.2 mg/dL   GFR, Estimated 10 (L) >60 mL/min    Comment: (NOTE) Calculated using the CKD-EPI Creatinine Equation (2021)    Anion gap 14 5 - 15    Comment: Performed at [facility], [address], [place], [place] [address]  Protime-INR     Status: None   Collection Time: 10/09/22  2:14 PM  Result Value Ref Range   Prothrombin Time 15.1 11.4 - 15.2 seconds   INR 1.2 0.8 - 1.2    Comment: (NOTE) INR goal varies based on device and disease states. Performed at [facility], [address], [place], [place] [address]    CT HEAD WO CONTRAST ( )  Result Date: 10/09/2022 CLINICAL DATA:  Fall at 2 a.m. this morning EXAM: CT HEAD WITHOUT CONTRAST CT CERVICAL SPINE WITHOUT CONTRAST TECHNIQUE: Multidetector CT imaging of the head and cervical spine was performed following the standard protocol without intravenous contrast. Multiplanar CT image reconstructions of the cervical spine were also generated. RADIATION DOSE REDUCTION: This exam was performed according to the departmental dose-optimization program which includes automated exposure control, adjustment of the mA and/or kV according to patient size and/or use of iterative reconstruction technique. COMPARISON:  None Available. FINDINGS: CT HEAD FINDINGS Brain: No evidence of acute infarction, hemorrhage, hydrocephalus, extra-axial collection or mass lesion/mass effect. Periventricular and deep white matter hypodensity. Vascular: No hyperdense vessel or unexpected calcification. Skull:  Normal. Negative for fracture or focal lesion. Sinuses/Orbits: No acute finding. Other: None. CT CERVICAL SPINE FINDINGS Alignment: Normal. Skull base and vertebrae: No acute fracture. No primary bone lesion or focal pathologic process. Soft tissues and spinal canal: No prevertebral fluid or swelling. No visible canal hematoma. Disc levels: Focally moderate disc  space height loss and osteophytosis of C6-C7 with otherwise mild disc degenerative change. Upper chest: Negative. Other: None. IMPRESSION: 1. No acute intracranial pathology. Small-vessel white matter disease. 2. No fracture or static subluxation of the cervical spine. 3. Focally moderate disc space height loss and osteophytosis of C6-C7 with otherwise mild disc degenerative change. Electronically Signed   By: Jearld Lesch M.D.   On: 10/09/2022 15:34   CT Cervical Spine Wo Contrast  Result Date: 10/09/2022 CLINICAL DATA:  Fall at 2 a.m. this morning EXAM: CT HEAD WITHOUT CONTRAST CT CERVICAL SPINE WITHOUT CONTRAST TECHNIQUE: Multidetector CT imaging of the head and cervical spine was performed following the standard protocol without intravenous contrast. Multiplanar CT image reconstructions of the cervical spine were also generated. RADIATION DOSE REDUCTION: This exam was performed according to the departmental dose-optimization program which includes automated exposure control, adjustment of the mA and/or kV according to patient size and/or use of iterative reconstruction technique. COMPARISON:  None Available. FINDINGS: CT HEAD FINDINGS Brain: No evidence of acute infarction, hemorrhage, hydrocephalus, extra-axial collection or mass lesion/mass effect. Periventricular and deep white matter hypodensity. Vascular: No hyperdense vessel or unexpected calcification. Skull: Normal. Negative for fracture or focal lesion. Sinuses/Orbits: No acute finding. Other: None. CT CERVICAL SPINE FINDINGS Alignment: Normal. Skull base and vertebrae: No acute fracture.  No primary bone lesion or focal pathologic process. Soft tissues and spinal canal: No prevertebral fluid or swelling. No visible canal hematoma. Disc levels: Focally moderate disc space height loss and osteophytosis of C6-C7 with otherwise mild disc degenerative change. Upper chest: Negative. Other: None. IMPRESSION: 1. No acute intracranial pathology. Small-vessel white matter disease. 2. No fracture or static subluxation of the cervical spine. 3. Focally moderate disc space height loss and osteophytosis of C6-C7 with otherwise mild disc degenerative change. Electronically Signed   By: Jearld Lesch M.D.   On: 10/09/2022 15:34    Pending Labs Unresulted Labs (From admission, onward)     Start     Ordered   10/09/22 1329  Urinalysis, Routine w reflex microscopic -Urine, Clean Catch  Once,   URGENT       Question:  Specimen Source  Answer:  Urine, Clean Catch   10/09/22 1328            Vitals/Pain Today's Vitals   10/09/22 1328 10/09/22 1329 10/09/22 1515 10/09/22 1515  BP:  (!) 98/54 (!) 104/57   Pulse:   (!) 51   Resp:   12   Temp:    (!) 97.5 F (36.4 C)  TempSrc:    Oral  SpO2:   96%   Weight: 88.6 kg     Height: 5\' 11"  (1.803 m)     PainSc: 0-No pain       Isolation Precautions No active isolations  Medications Medications  sodium chloride 0.9 % bolus 1,000 mL (1,000 mLs Intravenous New Bag/Given 10/09/22 1413)    Mobility walks     Focused Assessments Neuro Assessment Handoff:  Swallow screen pass? N/A     Last date known well: 10/09/22   Neuro Assessment:   Neuro Checks:      Has TPA been given? No If patient is a Neuro Trauma and patient is going to OR before floor call report to 4N Charge nurse: [phone] or [phone]   R Recommendations: See Admitting Provider Note  Report given to:   Additional Notes:

## 2022-10-09 NOTE — ED Provider Notes (Signed)
[address] EMERGENCY DEPARTMENT AT [facility] Provider Note   CSN: [id number] Arrival date & time: 10/09/22  1308     History HTN, CAD, CKD3 Chief Complaint  Patient presents with   Fall   Weakness    Brendan Butler is a 72 y.o. male.  72 year old male with a past medical history of HTN, CAD, CKD3 presents to the ED via EMS with a chief complaint of fall.  Patient reports he got up this morning from his bed suddenly walked to the bathroom felt that his bilateral knees went from under him and actually struck the ground, he does report hitting his head with a small abrasion noted to the frontal aspect of his face.  He is currently on Eliquis and reports compliance with medication.  He denies any recent illness, was hospitalized at the beginning of the month.  No chest pain, no shortness of breath, no headache, no urinary symptoms, no nausea or vomiting.  The history is provided by the patient.  Fall This is a new problem. Pertinent negatives include no chest pain, no abdominal pain, no headaches and no shortness of breath.  Weakness Associated symptoms: no abdominal pain, no chest pain, no fever, no headaches, no nausea, no shortness of breath and no vomiting        Home Medications Prior to Admission medications   Medication Sig Start Date End Date Taking? Authorizing Provider  acetaminophen (TYLENOL) 500 MG tablet Take 2 tablets (1,000 mg total) by mouth every 6 (six) hours. 08/28/22   Swayze, Ava, DO  amiodarone (PACERONE) 200 MG tablet Take 1/2 tablet (100 mg total) by mouth daily. 08/29/22   Swayze, Ava, DO  buPROPion (WELLBUTRIN XL) 150 MG 24 hr tablet Take 1 tablet (150 mg total) by mouth daily. 05/24/22   Elgergawy, Leana Roe, MD  empagliflozin (JARDIANCE) 10 MG TABS tablet Take 1 tablet (10 mg total) by mouth daily. 06/18/22   Marjie Skiff E, PA-C  ferrous sulfate 325 (65 FE) MG EC tablet Take 1 tablet (325 mg total) by mouth 2 (two) times daily. 07/31/22 09/27/22   Jerald Kief, MD  fluticasone (FLONASE) 50 MCG/ACT nasal spray Place 1 spray into both nostrils daily as needed for allergies. 12/24/21   [provider]  furosemide (LASIX) 40 MG tablet Take 1 tablet (40 mg total) by mouth 2 (two) times daily. 08/28/22   Swayze, Ava, DO  lactose free nutrition (BOOST PLUS) LIQD Take 237 mLs by mouth 2 (two) times daily between meals. 08/29/22   Swayze, Ava, DO  methocarbamol (ROBAXIN) 500 MG tablet Take 1 tablet (500 mg total) by mouth every 8 (eight) hours as needed for muscle spasms. 08/28/22   Swayze, Ava, DO  Multiple Vitamin (MULTIVITAMIN WITH MINERALS) TABS tablet Take 1 tablet by mouth daily. 08/29/22   Swayze, Ava, DO  ondansetron (ZOFRAN) 4 MG tablet Take 1 tablet (4 mg total) by mouth every 8 (eight) hours as needed for nausea or vomiting. 09/13/22 09/13/23  Rai, Ripudeep K, MD  oxyCODONE (OXY IR/ROXICODONE) 5 MG immediate release tablet Take 1 tablet (5 mg total) by mouth every 6 (six) hours as needed for moderate pain. 08/28/22   Swayze, Ava, DO  pantoprazole (PROTONIX) 40 MG tablet Take 1 tablet (40 mg total) by mouth daily. 05/11/22   Pokhrel, Rebekah Chesterfield, MD  PARoxetine (PAXIL) 30 MG tablet Take 30 mg by mouth daily.    [provider]  potassium chloride SA (KLOR-CON M) 20 MEQ tablet Take 2  tablets (40 mEq total) by mouth daily. 08/28/22   Swayze, Ava, DO  pregabalin (LYRICA) 150 MG capsule Take 1 capsule (150 mg total) by mouth 2 (two) times daily. 05/24/22   Elgergawy, Leana Roe, MD  QUEtiapine (SEROQUEL) 25 MG tablet Take 1 tablet (25 mg total) by mouth at bedtime. 05/10/22   Pokhrel, Rebekah Chesterfield, MD  Rivaroxaban (XARELTO) 15 MG TABS tablet Take 1 tablet (15 mg total) by mouth daily with supper. 08/28/22   Swayze, Ava, DO  rosuvastatin (CRESTOR) 20 MG tablet Take 1 tablet (20 mg total) by mouth daily. 06/18/22   Marjie Skiff E, PA-C  sacubitril-valsartan (ENTRESTO) 24-26 MG Take 1 tablet by mouth 2 (two) times daily. 06/18/22   Corrin Parker, PA-C   spironolactone (ALDACTONE) 25 MG tablet Take 1/2 tablet (12.5 mg total) by mouth daily. 06/18/22   Marjie Skiff E, PA-C      Allergies    Levitra [vardenafil], Aspirin, and Lipitor [atorvastatin]    Review of Systems   Review of Systems  Constitutional:  Negative for chills and fever.  HENT:  Negative for sore throat.   Respiratory:  Negative for shortness of breath.   Cardiovascular:  Negative for chest pain.  Gastrointestinal:  Negative for abdominal pain, nausea and vomiting.  Genitourinary:  Negative for flank pain.  Musculoskeletal:  Negative for back pain.  Neurological:  Positive for weakness. Negative for light-headedness and headaches.  All other systems reviewed and are negative.   Physical Exam Updated Vital Signs BP (!) 104/57   Pulse (!) 51   Temp (!) 97.5 F (36.4 C) (Oral)   Resp 12   Ht 5\' 11"  (1.803 m)   Wt 88.6 kg   SpO2 96%   BMI 27.24 kg/m  Physical Exam Vitals and nursing note reviewed.  Constitutional:      Appearance: Normal appearance.  HENT:     Head: Normocephalic.     Comments: Small abrasion to the left forehead.     Mouth/Throat:     Mouth: Mucous membranes are dry.     Comments: Oropharynx appears dry Eyes:     Comments: Pupils are pinpoint.   Neck:     Comments: No cervical midline tenderness.  Cardiovascular:     Rate and Rhythm: Normal rate.  Pulmonary:     Effort: Pulmonary effort is normal.     Breath sounds: No wheezing or rales.  Abdominal:     General: Abdomen is flat.     Palpations: Abdomen is soft.     Tenderness: There is no abdominal tenderness.  Musculoskeletal:     Cervical back: Normal range of motion and neck supple.  Skin:    General: Skin is warm and dry.  Neurological:     Mental Status: He is alert and oriented to person, place, and time.     Cranial Nerves: No dysarthria.     Comments: Upper and lower extremities without any focal weakness.  Sensation is intact throughout, no facial asymmetry, no  dysarthria, no focal weakness noted.     ED Results / Procedures / Treatments   Labs (all labs ordered are listed, but only abnormal results are displayed) Labs Reviewed  CBC WITH DIFFERENTIAL/PLATELET - Abnormal; Notable for the following components:      Result Value   WBC 13.1 (*)    RBC 3.55 (*)    Hemoglobin 9.2 (*)    HCT 29.4 (*)    MCH 25.9 (*)    RDW 19.8 (*)  Monocytes Absolute 1.7 (*)    All other components within normal limits  COMPREHENSIVE METABOLIC PANEL - Abnormal; Notable for the following components:   Sodium 129 (*)    Chloride 97 (*)    CO2 18 (*)    BUN 44 (*)    Creatinine, Ser 5.65 (*)    Calcium 8.1 (*)    Total Protein 5.7 (*)    Albumin 3.3 (*)    GFR, Estimated 10 (*)    All other components within normal limits  PROTIME-INR  URINALYSIS, ROUTINE W REFLEX MICROSCOPIC    EKG None  Radiology No results found.  Procedures Procedures   Medications Ordered in ED Medications  sodium chloride 0.9 % bolus 1,000 mL (1,000 mLs Intravenous New Bag/Given 10/09/22 1413)    ED Course/ Medical Decision Making/ A&P                                 Medical Decision Making Amount and/or Complexity of Data Reviewed Labs: ordered. Radiology: ordered.  Risk Decision regarding hospitalization.  This patient presents to the ED for concern of fall, this involves a number of treatment options, and is a complaint that carries with it a high risk of complications and morbidity.  The differential diagnosis includes fall versus syncope.    Co morbidities: Discussed in HPI   Brief History:  See HPI.   EMR reviewed including pt PMHx, past surgical history and past visits to ER.   See HPI for more details   Lab Tests:  I ordered and independently interpreted labs.  The pertinent results include:    CBC with a new leukocytosis of 13.1, hemoglobin slightly decreased but denies any source of bleeding.  PT and INR normal.  CMP is remarkable for  son hyponatremia, also elevated creatinine with a new AKI of 5.65, compared to his prior open 1.  LFTs are within normal limits.  UA is currently pending but denies any urinary symptoms at this time.  Imaging Studies:  CT head, CT cervical spine is currently pending.  Medicines ordered:  N/A  Reevaluation:  After the interventions noted above I re-evaluated patient and found that they have :stayed the same  Social Determinants of Health:  The patient's social determinants of health were a factor in the care of this patient  Problem List / ED Course:  Patient presented to the ED status post fall which occurred this morning when he was ambulating to the bathroom, he is anticoagulated on Eliquis per patient.  He denies any loss of consciousness, but does report that he has been feeling weaker than usual, feeling his legs giving out from under him.  His vitals are remarkable for soft blood pressures with systolics in the 90s, it is also slightly low however patient denies any dizziness, feeling lightheaded, just overall weak.  Some concern for dehydration, orthostatic vital signs have been ordered. His blood work is remarkable for leukocytosis of 13.1, CMP with some mild hyponatremia, creatinine with a new AKI and elevated BUN at 11, given 1 L bolus after review of his records that shows an echo from approximately 2 months ago that shows an EF of 50%.  There were trace of leukocytes but rare bacteria not having any urinary symptoms at this time.  I do feel that with his soft pressures, new AKI concern for patient having over diuresed at home, will need admission for hydration. Spoke to Dr.l Katrinka Blazing  who agrees of plan and management.  Dispostion:  3:36 PM Spoke to Dr. Katrinka Blazing hospitalist service who will admit patient for further management.  Appreciate his assistance.    Portions of this note were generated with Scientist, clinical (histocompatibility and immunogenetics). Dictation errors may occur despite best attempts at  proofreading.   Final Clinical Impression(s) / ED Diagnoses Final diagnoses:  Fall, initial encounter  Generalized weakness    Rx / DC Orders ED Discharge Orders     None         Claude Manges, PA-C 10/10/22 1610    Gerhard Munch, MD 10/10/22 1520

## 2022-10-09 NOTE — ED Notes (Signed)
Pt transported to XRay 

## 2022-10-09 NOTE — Progress Notes (Signed)
[facility] night cross cover note:   I was notified by RN that the patient is complaining of some nausea.  I subsequent placed order for prn IV Zofran.    Newton Pigg, DO Hospitalist

## 2022-10-10 DIAGNOSIS — D72829 Elevated white blood cell count, unspecified: Secondary | ICD-10-CM | POA: Diagnosis not present

## 2022-10-10 DIAGNOSIS — W19XXXA Unspecified fall, initial encounter: Secondary | ICD-10-CM | POA: Diagnosis not present

## 2022-10-10 DIAGNOSIS — N179 Acute kidney failure, unspecified: Secondary | ICD-10-CM | POA: Diagnosis not present

## 2022-10-10 DIAGNOSIS — I5032 Chronic diastolic (congestive) heart failure: Secondary | ICD-10-CM | POA: Diagnosis not present

## 2022-10-10 NOTE — Evaluation (Signed)
Physical Therapy Evaluation Patient Details Name: Brendan Butler MRN: [id number] DOB: 12/31/1950 Today's Date: 10/10/2022  History of Present Illness  72 y.o. male presents to [facility] hospital on 10/09/2022 after a fall at home. Pt reports recent lightheadedness with positional changes, along with continued nausea, vomiting and diarrhea. PMH includes aflutter, CAD, CKD III, L nephrectomy, CVA, HFrEF, HLD, HTN, OSA.  Clinical Impression  Pt presents to PT with deficits in cognition, awareness, endurance, gait, balance. Pt is able to ambulate within the hospital room without UE support and without considerable balance deviation. Pt does demonstrate some impaired awareness, found to have been sleeping on top of his portable heart monitor and with large mark on his back from the device. Pt also with some incontinence of urine and stool as sheets were covered in urine upon PT arrival. Pt is adamantly against rehab placement at this time. Based on current mobility level and pt resistance to placement PT recommends HHPT and use of SPC for stability. Pt will continue to benefit from intermittent supervision from family.      If plan is discharge home, recommend the following: A little help with bathing/dressing/bathroom;Direct supervision/assist for medications management;Direct supervision/assist for financial management;Assist for transportation;Supervision due to cognitive status;Help with stairs or ramp for entrance   Can travel by private vehicle        Equipment Recommendations None recommended by PT  Recommendations for Other Services       Functional Status Assessment Patient has had a recent decline in their functional status and demonstrates the ability to make significant improvements in function in a reasonable and predictable amount of time.     Precautions / Restrictions Precautions Precautions: Fall Restrictions Weight Bearing Restrictions: No      Mobility  Bed Mobility Overal  bed mobility: Needs Assistance Bed Mobility: Supine to Sit     Supine to sit: Supervision          Transfers Overall transfer level: Needs assistance Equipment used: None Transfers: Sit to/from Stand Sit to Stand: Supervision                Ambulation/Gait Ambulation/Gait assistance: Supervision Gait Distance (Feet): 100 Feet Assistive device: None Gait Pattern/deviations: Step-through pattern Gait velocity: functional Gait velocity interpretation: 1.31 - 2.62 ft/sec, indicative of limited community ambulator   General Gait Details: slowed step-through gait  Stairs            Wheelchair Mobility     Tilt Bed    Modified Rankin (Stroke Patients Only)       Balance Overall balance assessment: Needs assistance Sitting-balance support: No upper extremity supported, Feet supported Sitting balance-Leahy Scale: Good     Standing balance support: No upper extremity supported, During functional activity Standing balance-Leahy Scale: Fair                               Pertinent Vitals/Pain Pain Assessment Pain Assessment: No/denies pain    Home Living Family/patient expects to be discharged to:: Private residence Living Arrangements: Alone Available Help at Discharge: Family;Available PRN/intermittently Type of Home: House Home Access: Stairs to enter Entrance Stairs-Rails: Can reach both;Left;Right Entrance Stairs-Number of Steps: 3   Home Layout: One level Home Equipment: Agricultural consultant (2 wheels);Rollator (4 wheels);Standard Walker;Cane - single point;Wheelchair - manual;Other (comment);Grab bars - tub/shower;Hand held shower head;Shower seat Additional Comments: patient reports he has DME but does not use it    Prior Function Prior Level  of Function : Independent/Modified Independent             Mobility Comments: Independent with functional mobility without assistive device, falls prior to admission       Extremity/Trunk  Assessment   Upper Extremity Assessment Upper Extremity Assessment: Overall WFL for tasks assessed    Lower Extremity Assessment Lower Extremity Assessment: Defer to PT evaluation    Cervical / Trunk Assessment Cervical / Trunk Assessment: Normal  Communication   Communication Communication: No apparent difficulties Cueing Techniques: Verbal cues  Cognition Arousal: Alert Behavior During Therapy: WFL for tasks assessed/performed Overall Cognitive Status: Impaired/Different from baseline Area of Impairment: Safety/judgement, Awareness, Problem solving                     Memory: Decreased short-term memory Following Commands: Follows one step commands consistently Safety/Judgement: Decreased awareness of safety, Decreased awareness of deficits Awareness: Emergent Problem Solving: Slow processing, Requires verbal cues          General Comments General comments (skin integrity, edema, etc.): VSS on RA    Exercises     Assessment/Plan    PT Assessment Patient needs continued PT services  PT Problem List Decreased strength;Decreased activity tolerance;Decreased balance;Decreased mobility;Decreased cognition;Decreased knowledge of use of DME       PT Treatment Interventions DME instruction;Gait training;Stair training;Functional mobility training;Therapeutic activities;Balance training;Neuromuscular re-education;Patient/family education;Cognitive remediation    PT Goals (Current goals can be found in the Care Plan section)  Acute Rehab PT Goals Patient Stated Goal: to go home PT Goal Formulation: With patient Time For Goal Achievement: 10/24/22 Potential to Achieve Goals: Fair    Frequency Min 1X/week     Co-evaluation               AM-PAC PT "6 Clicks" Mobility  Outcome Measure Help needed turning from your back to your side while in a flat bed without using bedrails?: A Little Help needed moving from lying on your back to sitting on the side of a  flat bed without using bedrails?: A Little Help needed moving to and from a bed to a chair (including a wheelchair)?: A Little Help needed standing up from a chair using your arms (e.g., wheelchair or bedside chair)?: A Little Help needed to walk in hospital room?: A Little Help needed climbing 3-5 steps with a railing? : A Lot 6 Click Score: 17    End of Session   Activity Tolerance: Patient tolerated treatment well Patient left: in chair;with call bell/phone within reach;with chair alarm set Nurse Communication: Mobility status PT Visit Diagnosis: Other abnormalities of gait and mobility (R26.89);History of falling (Z91.81)    Time: 1017-1040 PT Time Calculation (min) (ACUTE ONLY): 23 min   Charges:   PT Evaluation $PT Eval Low Complexity: 1 Low   PT General Charges $$ ACUTE PT VISIT: 1 Visit         Arlyss Gandy, PT, DPT Acute Rehabilitation Office [phone]   Arlyss Gandy 10/10/2022, 12:01 PM

## 2022-10-10 NOTE — Evaluation (Signed)
Occupational Therapy Evaluation Patient Details Name: Brendan Butler MRN: [id number] DOB: 31-Mar-1950 Today's Date: 10/10/2022   History of Present Illness 72 y.o. male presents to [facility] hospital on 10/09/2022 after a fall at home. Pt reports recent lightheadedness with positional changes, along with continued nausea, vomiting and diarrhea. PMH includes aflutter, CAD, CKD III, L nephrectomy, CVA, HFrEF, HLD, HTN, OSA.   Clinical Impression   Pt s/p above diagnosis. Pt feeling better, states no pain at this time. Pt PLOF lives alone, has had several recent hospital admissions due to medical issues. Pt has two daughters who are available to assist if needed. Per daughter, Pt has had recent falls, they feel that Pt may be unsafe at home and require more therapy. Pt appears to be at baseline, A/Ox4, eager to return home, supervision for mobility, able to complete ADLs/IADLs without physical assistance. Due to hx of falling Pt would benefit from [facility] follow up to ensure functional safety in home environment to maximize independence and prevent further falls. Pt has no further acute care needs, needs can be met at next venue of care, Pt has all DME at home needed to remain safe.       If plan is discharge home, recommend the following: A little help with walking and/or transfers;A little help with bathing/dressing/bathroom    Functional Status Assessment  Patient has had a recent decline in their functional status and demonstrates the ability to make significant improvements in function in a reasonable and predictable amount of time.  Equipment Recommendations  None recommended by OT    Recommendations for Other Services       Precautions / Restrictions Precautions Precautions: Fall Restrictions Weight Bearing Restrictions: No      Mobility Bed Mobility Overal bed mobility: Modified Independent                  Transfers Overall transfer level: Needs assistance Equipment used:  None Transfers: Sit to/from Stand Sit to Stand: Supervision           General transfer comment: supervision for safety due to hx of falling      Balance Overall balance assessment: Needs assistance Sitting-balance support: No upper extremity supported, Feet supported Sitting balance-Leahy Scale: Good Sitting balance - Comments: EOB ADLs   Standing balance support: No upper extremity supported, During functional activity Standing balance-Leahy Scale: Fair Standing balance comment: able to stand/ambulate without AD, no LOB                           ADL either performed or assessed with clinical judgement   ADL Overall ADL's : At baseline;Needs assistance/impaired                                       General ADL Comments: Pt appears to be at baseline, due to recent falls would benefit from supervision with mobility.     Vision Baseline Vision/History: 1 Wears glasses Ability to See in Adequate Light: 0 Adequate Patient Visual Report: No change from baseline       Perception         Praxis         Pertinent Vitals/Pain Pain Assessment Pain Assessment: No/denies pain     Extremity/Trunk Assessment Upper Extremity Assessment Upper Extremity Assessment: Overall WFL for tasks assessed   Lower Extremity Assessment Lower Extremity Assessment: Defer to  PT evaluation   Cervical / Trunk Assessment Cervical / Trunk Assessment: Normal   Communication Communication Communication: No apparent difficulties Cueing Techniques: Verbal cues   Cognition Arousal: Alert Behavior During Therapy: WFL for tasks assessed/performed Overall Cognitive Status: Impaired/Different from baseline                                 General Comments: A/Ox4, no apparent deficits, but due to hx of falling and daughters reporting unsafe at home, may benefit from supervision     General Comments  VSS on RA    Exercises     Shoulder Instructions       Home Living Family/patient expects to be discharged to:: Private residence Living Arrangements: Alone Available Help at Discharge: Family;Available PRN/intermittently Type of Home: House Home Access: Stairs to enter [employer] of Steps: 3 Entrance Stairs-Rails: Can reach both;Left;Right Home Layout: One level     Bathroom Shower/Tub: Chief Strategy Officer: Handicapped height Bathroom Accessibility: Yes   Home Equipment: Agricultural consultant (2 wheels);Rollator (4 wheels);Standard Walker;Cane - single point;Wheelchair - manual;Other (comment);Grab bars - tub/shower;Hand held shower head;Shower seat   Additional Comments: patient reports he has DME but does not use it      Prior Functioning/Environment Prior Level of Function : Independent/Modified Independent             Mobility Comments: Independent with functional mobility without assistive device, falls prior to admission          OT Problem List:        OT Treatment/Interventions:      OT Goals(Current goals can be found in the care plan section) Acute Rehab OT Goals Patient Stated Goal: to return home OT Goal Formulation: With patient Time For Goal Achievement: 10/24/22 Potential to Achieve Goals: Good  OT Frequency: Min 1X/week    Co-evaluation              AM-PAC OT "6 Clicks" Daily Activity     Outcome Measure Help from another person eating meals?: None Help from another person taking care of personal grooming?: None Help from another person toileting, which includes using toliet, bedpan, or urinal?: A Little Help from another person bathing (including washing, rinsing, drying)?: A Little Help from another person to put on and taking off regular upper body clothing?: None Help from another person to put on and taking off regular lower body clothing?: A Little 6 Click Score: 21   End of Session Nurse Communication: Mobility status  Activity Tolerance: Patient tolerated  treatment well Patient left: in bed;with call bell/phone within reach  OT Visit Diagnosis: Unsteadiness on feet (R26.81);History of falling (Z91.81);Pain Pain - part of body:  (abdomen)                Time: 4098-1191 OT Time Calculation (min): 17 min Charges:  OT General Charges $OT Visit: 1 Visit OT Evaluation $OT Eval Low Complexity: 1 Low  [address] OTR/L   Alexis Goodell 10/10/2022, 12:32 PM

## 2022-10-10 NOTE — TOC CM/SW Note (Signed)
Transition of Care [facility]) - Inpatient Brief Assessment   Patient Details  Name: Brendan Butler MRN: [id number] Date of Birth: [birth year]  Transition of Care [facility]) CM/SW Contact:    [name], [name], RN Phone Number: 10/10/2022, 2:42 PM   Clinical Narrative:  Patient presented to the ED after a fall at home. Found to AKI.  Patient was recently discharged from the hospital.    From home alone. Has four supportive children, three lives close by.  Has all necessary DME's at home.  PCP is [name], MD and uses [facility] in [place].    TOC consulted for SNF/Rehab by MD per patient's daughter's request and PT/OT recommended Home Health.  CM spoke with patient at bedside and patient declined both SNF and Home Health recommendations.  Patient  states he will walk out of any facility he is taken to as he is a private person and does not want people in his home. States he does not want people invading his privacy and will go the hospital when he needs to.  CM called patient's daughter, [name] [phone]) who expressed her concern and dissatisfaction about patient's response and understands patient has capacity and can make his own decisions.   Medical workup continues.  CM will continue to follow as patient progresses with care towards discharge.      Transition of Care Asessment: Insurance and Status: Insurance coverage has been reviewed Patient has primary care physician: Yes Home environment has been reviewed: Yes Prior level of function:: Independent Prior/Current Home Services: No current home services Social Determinants of Health Reivew: SDOH reviewed no interventions necessary Readmission risk has been reviewed: Yes Transition of care needs: transition of care needs identified, TOC will continue to follow (Declined SNF and Home Health recommendations)

## 2022-10-11 DIAGNOSIS — N179 Acute kidney failure, unspecified: Secondary | ICD-10-CM | POA: Diagnosis not present

## 2022-10-11 DIAGNOSIS — W19XXXA Unspecified fall, initial encounter: Secondary | ICD-10-CM | POA: Diagnosis not present

## 2022-10-11 DIAGNOSIS — I5032 Chronic diastolic (congestive) heart failure: Secondary | ICD-10-CM | POA: Diagnosis not present

## 2022-10-11 DIAGNOSIS — D72829 Elevated white blood cell count, unspecified: Secondary | ICD-10-CM | POA: Diagnosis not present

## 2022-10-11 LAB — CBC
HCT: 29.6 % — ABNORMAL LOW (ref 39.0–52.0)
Hemoglobin: 9.7 g/dL — ABNORMAL LOW (ref 13.0–17.0)
MCH: 27 pg (ref 26.0–34.0)
MCHC: 32.8 g/dL (ref 30.0–36.0)
MCV: 82.5 fL (ref 80.0–100.0)
Platelets: 203 10*3/uL (ref 150–400)
RBC: 3.59 MIL/uL — ABNORMAL LOW (ref 4.22–5.81)
RDW: 20.1 % — ABNORMAL HIGH (ref 11.5–15.5)
WBC: 8.2 10*3/uL (ref 4.0–10.5)
nRBC: 0 % (ref 0.0–0.2)

## 2022-10-11 LAB — BASIC METABOLIC PANEL
Anion gap: 8 (ref 5–15)
BUN: 26 mg/dL — ABNORMAL HIGH (ref 8–23)
CO2: 21 mmol/L — ABNORMAL LOW (ref 22–32)
Calcium: 8 mg/dL — ABNORMAL LOW (ref 8.9–10.3)
Chloride: 109 mmol/L (ref 98–111)
Creatinine, Ser: 2.7 mg/dL — ABNORMAL HIGH (ref 0.61–1.24)
GFR, Estimated: 25 mL/min — ABNORMAL LOW (ref >60)
Glucose, Bld: 99 mg/dL (ref 70–99)
Potassium: 4 mmol/L (ref 3.5–5.1)
Sodium: 138 mmol/L (ref 135–145)

## 2022-10-11 MED ORDER — RIVAROXABAN 15 MG PO TABS
15.0000 mg | ORAL_TABLET | Freq: Every day | ORAL | Status: DC
  Administered 2022-10-11: 15 mg via ORAL
  Filled 2022-10-11: qty 1

## 2022-10-11 NOTE — Consult Note (Signed)
   [facility] CM Inpatient Consult   10/11/2022  MERRELL VITTORI [birth year] [id number]  [facility] [[facility]]  Accountable Care Organization [ACO] Patient: [facility]  insurance  Primary Care Provider:  [name], MD is an [place] provider at [facility]  is listed to provide the transition of care follow up calls and appointments  Patient screened for less than 30 days readmission and 7 hospitalizations in the past 6 months noted in [facility] system with noted extreme high risk score for unplanned readmission risk and needs for post hospital transition for care coordination.  Review of patient's electronic medical record reveals patient is with a recent fall at home [lives alone per inpatient TOC team] with noted as adamantly against rehab for post hospital transition noted per PT evaluation and resistant to Home Health does not want people in his home.   Plan:  Continue to follow progress and disposition to assess for post hospital community care coordination/management needs.    Referral request for community care coordination: anticipate the [facility] for [facility] for follow up will update on findings for post hospital   Of note, [facility] Care Management/Population Health does not replace or interfere with any arrangements made by the Inpatient Transition of Care team.  For questions contact:   Charlesetta Shanks, RN BSN CCM [facility]  [phone] business mobile phone Toll free office [phone]  Fax number: [phone] [email or web address]

## 2022-10-11 NOTE — Progress Notes (Signed)
Physical Therapy Treatment Patient Details Name: Brendan Butler MRN: [id number] DOB: 1950-07-18 Today's Date: 10/11/2022   History of Present Illness 72 y.o. male presents to [facility] hospital on 10/09/2022 after a fall at home. Pt reports recent lightheadedness with positional changes, along with continued nausea, vomiting and diarrhea. PMH includes aflutter, CAD, CKD III, L nephrectomy, CVA, HFrEF, HLD, HTN, OSA.    PT Comments  Pt received in chair, agreeable to therapy session and eager to work on longer distance gait trial. Pt making good progress toward goals, no acute s/sx distress or LOB with higher level balance tasks and able to perform x10 steps with BUE support and Supervision. Pt modI for transfers. Pt scored 21/24 on Dynamic Gait Index, which does not indicate increased fall risk. Pt agreeable to trial compression socks if indicated to see if this will help reduce his symptoms of lightheadedness at home, but did not have any symptoms of this today and states he has been keeping better hydrated since admission. Pt continues to benefit from PT services to progress toward functional mobility goals.     If plan is discharge home, recommend the following: A little help with bathing/dressing/bathroom;Direct supervision/assist for medications management;Direct supervision/assist for financial management;Assist for transportation;Supervision due to cognitive status;Help with stairs or ramp for entrance;Assistance with cooking/housework   Can travel by private vehicle        Equipment Recommendations  None recommended by PT    Recommendations for Other Services       Precautions / Restrictions Precautions Precautions: Fall Precaution Comments: Enteric precs Restrictions Weight Bearing Restrictions: No     Mobility  Bed Mobility               General bed mobility comments: pt received up in chair    Transfers Overall transfer level: Modified independent Equipment used:  None Transfers: Sit to/from Stand Sit to Stand: Modified independent (Device/Increase time)           General transfer comment: pt using hands for support    Ambulation/Gait Ambulation/Gait assistance: Supervision Gait Distance (Feet): 350 Feet Assistive device: None Gait Pattern/deviations: Step-through pattern, WFL(Within Functional Limits)       General Gait Details: fair pace, see DGI; no LOB with higher level balance tasks, no c/o lightheadedness   Stairs Stairs: Yes Stairs assistance: Supervision Stair Management: Two rails, Step to pattern, Forwards Number of Stairs: 10 General stair comments: vcs for safe sequencing and activity pacing, no LOB or buckling (single 7" step in room x10 reps due to pt on enteric precs)   Wheelchair Mobility     Tilt Bed    Modified Rankin (Stroke Patients Only)       Balance Overall balance assessment: Needs assistance Sitting-balance support: No upper extremity supported, Feet supported Sitting balance-Leahy Scale: Good Sitting balance - Comments: EOB ADLs   Standing balance support: No upper extremity supported, During functional activity Standing balance-Leahy Scale: Good Standing balance comment: able to stand/ambulate without AD, no LOB                 Standardized Balance Assessment Standardized Balance Assessment : Dynamic Gait Index   Dynamic Gait Index Level Surface: Normal Change in Gait Speed: Mild Impairment Gait with Horizontal Head Turns: Normal Gait with Vertical Head Turns: Normal Gait and Pivot Turn: Normal Step Over Obstacle: Normal Step Around Obstacles: Normal Steps: Moderate Impairment Total Score: 21      Cognition Arousal: Alert Behavior During Therapy: WFL for tasks assessed/performed Overall Cognitive  Status: Impaired/Different from baseline                                 General Comments: A/Ox4, no apparent deficits, but due to hx of falling and daughters  reporting unsafe at home, may benefit from supervision        Exercises      General Comments General comments (skin integrity, edema, etc.): VSS per chart review, no acute s/sx distress, reviewed self-monitoring wtih all mobility for signs/symptoms of orthostatic hypotension and making sure he is hydrated given recent fall after BLE buckling.      Pertinent Vitals/Pain Pain Assessment Pain Assessment: No/denies pain    Home Living                          Prior Function            PT Goals (current goals can now be found in the care plan section) Acute Rehab PT Goals Patient Stated Goal: to go home PT Goal Formulation: With patient Time For Goal Achievement: 10/24/22 Progress towards PT goals: Progressing toward goals    Frequency    Min 1X/week      PT Plan      Co-evaluation              AM-PAC PT "6 Clicks" Mobility   Outcome Measure  Help needed turning from your back to your side while in a flat bed without using bedrails?: None Help needed moving from lying on your back to sitting on the side of a flat bed without using bedrails?: None Help needed moving to and from a bed to a chair (including a wheelchair)?: None Help needed standing up from a chair using your arms (e.g., wheelchair or bedside chair)?: None Help needed to walk in hospital room?: None Help needed climbing 3-5 steps with a railing? : A Little 6 Click Score: 23    End of Session Equipment Utilized During Treatment: Gait belt Activity Tolerance: Patient tolerated treatment well Patient left: in chair;with call bell/phone within reach;Other (comment) (RN OK to leave chair alarm off as pt without orthostatic symptoms and no instabilty during hallway mobility/stairs) Nurse Communication: Mobility status PT Visit Diagnosis: Other abnormalities of gait and mobility (R26.89);History of falling (Z91.81)     Time: 5284-1324 PT Time Calculation (min) (ACUTE ONLY): 15  min  Charges:    $Gait Training: 8-22 mins PT General Charges $$ ACUTE PT VISIT: 1 Visit                      P., PTA Acute Rehabilitation Services Secure Chat Preferred 9a-5:30pm Office: [phone]    Dorathy Kinsman [facility] 10/11/2022, 6:24 PM

## 2022-10-11 NOTE — Plan of Care (Signed)

## 2022-10-11 NOTE — Progress Notes (Signed)
Progress Note   Patient: Brendan Butler ZOX:[id number] DOB: 1950-11-14 DOA: 10/09/2022     2 DOS: the patient was seen and examined on 10/11/2022   Brief hospital course: Brendan Butler is a 72 y.o. male with medical history significant of hypertension, hyperlipidemia, atrial fibrillation on Xarelto, HFpEF, adenocarcinoma of the colon s/p hemicolectomy 6/30, and OSA who presents after having a fall at home.  He states that he got up around 2 AM this morning to use the bathroom when he lost his footing on a Styrofoam cup that was on the floor.  He states that his legs felt like rubber and he fell forward hitting his head.  Denies any loss of consciousness.  He had been feeling lightheaded with changes in position.  He had been hospitalized 7/15-7/18 with sepsis thought secondary to colitis discharged home on Augmentin. Thereafter he was just recently admitted into the hospital from 8/1-8/4 with diverticulitis of the descending colon with AKI and sent home to complete 10 days of antibiotics with ciprofloxacin and metronidazole.  During hospitalization medication like Entresto, spironolactone, and furosemide have been held, but were resumed at discharge.  Notes associated symptoms of nausea, episodes of vomiting yesterday, subjective fevers, and continued diarrhea.  Patient is admitted to hospitalist service for further management evaluation of fall, acute kidney injury on top of CKD stage III, leukocytosis, nausea, vomiting and diarrhea, hyponatremia.   Assessment and Plan: Acute kidney injury superimposed on chronic kidney disease stage III Patient presents with creatinine elevated up to 5.65 with a BUN 44.  Baseline creatinine previously had been 1.73 when last discharge from hospital 10 days ago.   His creatinine improved with fluids. His nausea, diarrhea better now. This is likely pre-renal. Monitor strict I&Os and daily weights Hold nephrotoxic agents entresto, Lasix, aldactone. Will stop  normal saline IV fluids at 100 mL/h, he is eating fair. Am BMP ordered. Likely will need to have adjustments to his medication regimen at discharge.   Leukocytosis Patient reported having subjective fevers at home prior to arrival.  WBC elevate at 13.1 trended down.  Chest x-ray showed no acute abnormality.  Urinalysis noted trace leukocytes, rare bacteria, and 6-10 WBCs. Follow-up urine culture  C. Difficile studies negative. No need for antibiotic at this time.   Nausea, vomiting, diarrhea Patient reports still reports intermittently having nausea, vomiting, and diarrhea.  Symptoms previously thought secondary to colitis and subsequently diverticulitis in thje last 2 hospitalizations since last month for which patient was treated with antibiotics.  He denies any complaints of abdominal pain at this time. Aspiration precautions with elevation head of bed C diff negative.  CT scan of the abdomen pelvis reviewed showed improvement of previously seen diverticulitis, no acute abnormality. Continue Protonix IV, Probiotics   Hyponatremia Acute.  Sodium 129.  Thought to be hypovolemic hyponatremia given nausea, vomiting, diarrhea, and diuretic use. -IV fluids as noted above -Recheck sodium levels in a.m.   Hypochromic Anemia  Acute on chronic. Hemoglobin noted to be 9.2, but previously had been 10.5 at discharge on 8/3.  Patient did not report any complaints of bleeding.  He is on anticoagulation of Xarelto. No active bleeding. Monitor h/h.   Falls at home Had multiple fall at home.  He denies any loss of consciousness, but did report having episodes of dizziness.   No acute intracranial injury appreciated. Follow-up orthostatic vital signs PT/OT to evaluation for discharge planning.   Essential hypertension On admission blood pressures noted to be as low as  98/54.  Patient reported feeling lightheaded when getting up and moving around.  Suspect patient likely needs adjustment to diuretic  regimen. Hold Entresto, spironolactone, and furosemide   CAD Dyslipidemia Patient with a prior history of PCI.   Does not report any complaints of chest pain. Continue Crestor   Atrial flutter on chronic anticoagulation Continue amiodarone   Heart failure with preserved EF Euvolemic. Stop IV fluids. Lasix, Aldactone, Jardiance, entresto held due to AKI. Strict I&Os and daily weights   Depression and anxiety Continue Wellbutrin, Paxil and Seroquel   History of colon cancer S/p right hemicolectomy with ileostomy anastomosis by Dr. Derrell Lolling on 08/23/2022 for right colon mass.  Pathology revealed invasive well to moderately differentiated adenocarcinoma. Outpatient follow up suggested.    OSA   No compliant with CPAP at night.   GERD Continue Protonix      Subjective: Patient is seen and examined today morning. He is sitting in chair. Want to eat regular diet. Tolerating full liquids. No nausea, vomiting or diarrhea. Asks if he can go home. Advised to work with PT.  Physical Exam: Vitals:   10/10/22 2113 10/11/22 0424 10/11/22 0910 10/11/22 1710  BP: (!) 120/58 113/67 117/81 (!) 154/67  Pulse: 65 (!) 58 63 62  Resp: 18 18 18 18   Temp: 98.5 F (36.9 C) 98.4 F (36.9 C) 98.6 F (37 C) 98.6 F (37 C)  TempSrc:      SpO2: 94% 96% 97% 100%  Weight:      Height:       General - Elderly Caucasian weak male, no apparent distress HEENT - PERRLA, EOMI, atraumatic head, non tender sinuses. Lung - Clear, diffuse rhonchi. Heart - S1, S2 heard, no murmurs, rubs, trace pedal edema Neuro - Alert, awake and oriented x 3, non focal exam. Skin - Warm and dry. Data Reviewed:     Latest Ref Rng & Units 10/11/2022    1:29 AM 10/10/2022    6:24 AM 10/09/2022    6:44 PM  CBC  WBC 4.0 - 10.5 K/uL 8.2  8.3    Hemoglobin 13.0 - 17.0 g/dL 9.7  62.8  31.5   Hematocrit 39.0 - 52.0 % 29.6  32.0  32.8   Platelets 150 - 400 K/uL 203  195         Latest Ref Rng & Units 10/11/2022    1:29  AM 10/10/2022    6:24 AM 10/09/2022    2:14 PM  BMP  Glucose 70 - 99 mg/dL 99  96  92   BUN 8 - 23 mg/dL 26  37  44   Creatinine 0.61 - 1.24 mg/dL 1.76  1.60  7.37   Sodium 135 - 145 mmol/L 138  133  129   Potassium 3.5 - 5.1 mmol/L 4.0  3.6  4.1   Chloride 98 - 111 mmol/L 109  102  97   CO2 22 - 32 mmol/L 21  19  18    Calcium 8.9 - 10.3 mg/dL 8.0  7.9  8.1    CT ABDOMEN PELVIS WO CONTRAST  Result Date: 10/09/2022 CLINICAL DATA:  History of diverticulitis EXAM: CT ABDOMEN AND PELVIS WITHOUT CONTRAST TECHNIQUE: Multidetector CT imaging of the abdomen and pelvis was performed following the standard protocol without IV contrast. RADIATION DOSE REDUCTION: This exam was performed according to the departmental dose-optimization program which includes automated exposure control, adjustment of the mA and/or kV according to patient size and/or use of iterative reconstruction technique. COMPARISON:  09/27/2022 FINDINGS:  Lower chest: Mild dependent atelectatic changes are seen. Hepatobiliary: No focal liver abnormality is seen. Status post cholecystectomy. No biliary dilatation. Pancreas: Unremarkable. No pancreatic ductal dilatation or surrounding inflammatory changes. Spleen: Normal in size without focal abnormality. Adrenals/Urinary Tract: Adrenal glands are within normal limits. Left kidney has been surgically removed. Right kidney is stable in appearance. No obstructive changes are seen. The bladder is well distended. Stomach/Bowel: Fecal material is noted within the colon. Mild diverticular changes noted without evidence of diverticulitis. No evidence of perforation is seen. Changes of prior right hemicolectomy are noted. The ileocolic anastomosis is widely patent. The appendix is not visualized consistent with the prior surgical history. The stomach is unremarkable. Vascular/Lymphatic: Aortic atherosclerosis. No enlarged abdominal or pelvic lymph nodes. Reproductive: Prostate is unremarkable. Other: No  abdominal wall hernia or abnormality. No abdominopelvic ascites. Musculoskeletal: No acute or significant osseous findings. IMPRESSION: Previously seen mild diverticulitis has resolved. No abscess or free fluid is seen. Postsurgical changes are noted. No acute abnormality noted. Electronically Signed   By: Alcide Clever M.D.   On: 10/09/2022 23:54     Patient wishes to go home tomorrow. Will get PT evaluation for safe dc plan.  Disposition: Status is: Inpatient Remains inpatient appropriate because: AKI, dehydration, fall  Planned Discharge Destination: Home with Home Health    Time spent: 44 minutes  Author: Marcelino Duster, MD 10/11/2022 6:26 PM  For on call review [email or web address].

## 2022-10-11 NOTE — Progress Notes (Signed)
Progress Note   Patient: Brendan Butler DOB: 03/31/1950 DOA: 10/09/2022     2 DOS: the patient was seen and examined on 10/11/2022   Brief hospital course: Brendan Butler is a 73 y.o. male with medical history significant of hypertension, hyperlipidemia, atrial fibrillation on Xarelto, HFpEF, adenocarcinoma of the colon s/p hemicolectomy 6/30, and OSA who presents after having a fall at home.  He states that he got up around 2 AM this morning to use the bathroom when he lost his footing on a Styrofoam cup that was on the floor.  He states that his legs felt like rubber and he fell forward hitting his head.  Denies any loss of consciousness.  He had been feeling lightheaded with changes in position.  He had been hospitalized 7/15-7/18 with sepsis thought secondary to colitis discharged home on Augmentin. Thereafter he was just recently admitted into the hospital from 8/1-8/4 with diverticulitis of the descending colon with AKI and sent home to complete 10 days of antibiotics with ciprofloxacin and metronidazole.  During hospitalization medication like Entresto, spironolactone, and furosemide have been held, but were resumed at discharge.  Notes associated symptoms of nausea, episodes of vomiting yesterday, subjective fevers, and continued diarrhea.  Patient is admitted to hospitalist service for further management evaluation of fall, acute kidney injury on top of CKD stage III, leukocytosis, nausea, vomiting and diarrhea, hyponatremia.   Assessment and Plan: Acute kidney injury superimposed on chronic kidney disease stage III Patient presents with creatinine elevated up to 5.65 with a BUN 44.  Baseline creatinine previously had been 1.73 when last discharge from hospital 10 days ago.   strict I&Os and daily weights Hold nephrotoxic agents Continue normal saline IV fluids at 100 mL/h Recheck kidney function in a.m. Likely will need to have adjustments to his medication regimen at  discharge.   Leukocytosis Patient reported having subjective fevers at home prior to arrival.  WBC elevate at 13.1.  Chest x-ray showed no acute abnormality.  Urinalysis noted trace leukocytes, rare bacteria, and 6-10 WBCs. Follow-up urine culture Check C. Difficile No need for antibiotic at this time. Repeat CBC tomorrow morning   Nausea, vomiting, diarrhea Patient reports still reports intermittently having nausea, vomiting, and diarrhea.  Symptoms previously thought secondary to colitis and subsequently diverticulitis in thje last 2 hospitalizations since last month for which patient was treated with antibiotics.  He denies any complaints of abdominal pain at this time. -Aspiration precautions with elevation head of bed -Check C. difficile given recent antibiotics if diarrhea persist -Check CT scan of the abdomen pelvis -Protonix IV  -Probiotics   Hyponatremia Acute.  Sodium 129.  Thought to be hypovolemic hyponatremia given nausea, vomiting, diarrhea, and diuretic use. -IV fluids as noted above -Recheck sodium levels in a.m.   Hypochromic Anemia  Acute on chronic. Hemoglobin noted to be 9.2, but previously had been 10.5 at discharge on 8/3.  Patient did not report any complaints of bleeding.  He is on anticoagulation of Xarelto. -Type and screen for possible need of blood products -Recheck H&H   Falls at home Had multiple fall at home.  He denies any loss of consciousness, but did report having episodes of dizziness.   No acute intracranial injury appreciated. Follow-up orthostatic vital signs PT/OT to evaluate and treat   Essential hypertension On admission blood pressures noted to be as low as 98/54.  Patient reported feeling lightheaded when getting up and moving around.  Suspect patient likely needs adjustment to diuretic  regimen. Hold home blood pressure medications of Entresto, spironolactone, and furosemide   CAD Dyslipidemia Patient with a prior history of PCI.    Does not report any complaints of chest pain. Continue Crestor   Atrial flutter on chronic anticoagulation Continue amiodarone   Heart failure with preserved EF Chronic.  Patient does not have a lower extremity swelling or signs of JVD on physical exam to suggest he is fluid overloaded.  Last echocardiogram noted EF to be around 50% with normal diastolic parameters on 08/17/2022. Strict I&Os and daily weights Held Jardiance due to AKI as well as other medications as listed above   Depression and anxiety Continue Wellbutrin, Paxil and Seroquel   History of colon cancer S/p right hemicolectomy with ileostomy anastomosis by Dr. Derrell Lolling on 08/23/2022 for right colon mass.  Pathology revealed invasive well to moderately differentiated adenocarcinoma. Outpatient follow up suggested.    OSA   No compliant with CPAP at night.   GERD Continue Protonix      Subjective: Patient is seen and examined today morning. He is feeling better. No nausea. Wishes to eat. Tolerating clears well.  Physical Exam: Vitals:   10/10/22 1551 10/10/22 2113 10/11/22 0424 10/11/22 0910  BP: (!) 115/46 (!) 120/58 113/67 117/81  Pulse: (!) 53 65 (!) 58 63  Resp: 19 18 18 18   Temp: 98.3 F (36.8 C) 98.5 F (36.9 C) 98.4 F (36.9 C) 98.6 F (37 C)  TempSrc: Oral     SpO2: 95% 94% 96% 97%  Weight:      Height:       General - Elderly Caucasian weak male, no apparent distress HEENT - PERRLA, EOMI, atraumatic head, non tender sinuses. Lung - Clear, diffuse rhonchi. Heart - S1, S2 heard, no murmurs, rubs, trace pedal edema Neuro - Alert, awake and oriented x 3, non focal exam. Skin - Warm and dry. Data Reviewed:  CBC, BMP  Family Communication: Will call his daughter to update his care plan.  Disposition: Status is: Inpatient Remains inpatient appropriate because: AKI, dehydration, fall  Planned Discharge Destination: Home with Home Health    Time spent: 45 minutes  Author: Marcelino Duster, MD 10/11/2022 2:30 PM  For on call review [email or web address].

## 2022-10-12 ENCOUNTER — Other Ambulatory Visit: Payer: Self-pay

## 2022-10-12 ENCOUNTER — Inpatient Hospital Stay: Payer: Medicare HMO | Admitting: Oncology

## 2022-10-12 ENCOUNTER — Encounter (HOSPITAL_COMMUNITY): Payer: Self-pay | Admitting: Internal Medicine

## 2022-10-12 DIAGNOSIS — N179 Acute kidney failure, unspecified: Secondary | ICD-10-CM | POA: Diagnosis not present

## 2022-10-12 DIAGNOSIS — N189 Chronic kidney disease, unspecified: Secondary | ICD-10-CM | POA: Diagnosis not present

## 2022-10-12 LAB — BASIC METABOLIC PANEL
Anion gap: 11 (ref 5–15)
BUN: 13 mg/dL (ref 8–23)
CO2: 19 mmol/L — ABNORMAL LOW (ref 22–32)
Calcium: 8.2 mg/dL — ABNORMAL LOW (ref 8.9–10.3)
Chloride: 108 mmol/L (ref 98–111)
Creatinine, Ser: 2.1 mg/dL — ABNORMAL HIGH (ref 0.61–1.24)
GFR, Estimated: 33 mL/min — ABNORMAL LOW (ref >60)
Glucose, Bld: 119 mg/dL — ABNORMAL HIGH (ref 70–99)
Potassium: 4.1 mmol/L (ref 3.5–5.1)
Sodium: 138 mmol/L (ref 135–145)

## 2022-10-12 LAB — CBC
HCT: 30.6 % — ABNORMAL LOW (ref 39.0–52.0)
Hemoglobin: 9.6 g/dL — ABNORMAL LOW (ref 13.0–17.0)
MCH: 26.2 pg (ref 26.0–34.0)
MCHC: 31.4 g/dL (ref 30.0–36.0)
MCV: 83.6 fL (ref 80.0–100.0)
Platelets: 184 10*3/uL (ref 150–400)
RBC: 3.66 MIL/uL — ABNORMAL LOW (ref 4.22–5.81)
RDW: 20.6 % — ABNORMAL HIGH (ref 11.5–15.5)
WBC: 7.1 10*3/uL (ref 4.0–10.5)
nRBC: 0 % (ref 0.0–0.2)

## 2022-10-12 LAB — CK TOTAL AND CKMB (NOT AT ARMC)
CK, MB: 1.4 ng/mL (ref 0.5–5.0)
Total CK: 69 U/L (ref 49–397)

## 2022-10-12 LAB — MAGNESIUM: Magnesium: 1.9 mg/dL (ref 1.7–2.4)

## 2022-10-12 NOTE — TOC Transition Note (Signed)
Transition of Care [facility]) - CM/SW Discharge Note   Patient Details  Name: Brendan Butler MRN: [id number] Date of Birth: [birth year]  [facility] CM/SW Contact:  [name], [name], RN Phone Number: 10/12/2022, 12:12 PM   Clinical Narrative:     Patient is scheduled for discharge today.  Readmission Risk Assessment done. Outpatient f/u, hospital f/u and discharge instructions on AVS. Patient declined SNF and HH recommendations, education done. Nephew, Tommy to transport at discharge.  No further TOC needs noted.       Final next level of care: Home/Self Care Barriers to Discharge: Barriers Resolved   Patient Goals and CMS Choice CMS Medicare.gov Compare Post Acute Care list provided to:: Patient Choice offered to / list presented to : Patient, Adult Children (Daughter, [name])  Discharge Placement                  Patient to be transferred to facility by: Nephew Name of family member notified: Tommy    Discharge Plan and Services Additional resources added to the After Visit Summary for                  DME Arranged: N/A DME Agency: NA       HH Arranged: NA HH Agency: NA        Social Determinants of Health (SDOH) Interventions SDOH Screenings   Food Insecurity: No Food Insecurity (10/09/2022)  Housing: Low Risk  (10/09/2022)  Transportation Needs: No Transportation Needs (10/09/2022)  Utilities: Not At Risk (10/09/2022)  Alcohol Screen: Low Risk  (08/20/2022)  Financial Resource Strain: Low Risk  (08/20/2022)  Tobacco Use: High Risk (10/12/2022)     Readmission Risk Interventions    10/10/2022    2:40 PM 09/28/2022    1:19 PM  Readmission Risk Prevention Plan  Transportation Screening Complete Complete  Medication Review (RN Care Manager) Referral to Pharmacy Referral to Pharmacy  PCP or Specialist appointment within 3-5 days of discharge Complete Complete  HRI or Home Care Consult Complete Complete  SW Recovery  Care/Counseling Consult Complete Complete  Palliative Care Screening Not Applicable Not Applicable  Skilled Nursing Facility Not Applicable Not Applicable

## 2022-10-12 NOTE — Discharge Summary (Signed)
Physician Discharge Summary   Patient: Brendan Butler MRN: [id number] DOB: 08-08-1950  Admit date:     10/09/2022  Discharge date: 10/12/22  Discharge Physician: Aceton Kinnear   PCP: [name], MD   Recommendations at discharge:   Follow-up with PCP on 10/15/22 to get a repeat BMP and make adjustments to medications.  Discharge Diagnoses: Principal Problem:   Acute kidney injury superimposed on chronic kidney disease (HCC) Active Problems:   Leukocytosis   Hyponatremia   Essential hypertension   Falls   Hypochromic anemia   Dyslipidemia   Coronary artery disease involving native coronary artery of native heart without angina pectoris   Atrial flutter (HCC)   Chronic heart failure with preserved ejection fraction (HFpEF) (HCC)   Depression with anxiety   History of colon cancer   Gastroenteritis  Resolved Problems:   * No resolved hospital problems. *  Hospital Course:  Brendan Butler is a 72 y.o. male with medical history significant of hypertension, hyperlipidemia, atrial fibrillation on Xarelto, HFpEF, adenocarcinoma of the colon s/p hemicolectomy 6/30, and OSA who presents after having a fall at home.  He states that he got up around 2 AM this morning to use the bathroom when he lost his footing on a Styrofoam cup that was on the floor.  He states that his legs felt like rubber and he fell forward hitting his head.  Denies any loss of consciousness.  He had been feeling lightheaded with changes in position.  He had been hospitalized 7/15-7/18 with sepsis thought secondary to colitis discharged home on Augmentin. Thereafter he was just recently admitted into the hospital from 8/1-8/4 with diverticulitis of the descending colon with AKI and sent home to complete 10 days of antibiotics with ciprofloxacin and metronidazole.  During hospitalization medication like Entresto, spironolactone, and furosemide have been held, but were resumed at discharge.  Notes associated  symptoms of nausea, episodes of vomiting yesterday, subjective fevers, and continued diarrhea.  Denies having chest pain abdominal pain, shortness of breath, blood in stools, or significant leg swelling.  Makes note that his daughter organizes his meds and he takes them.  He makes note that he is on a couple diuretic medications.    Daughter calls and makes note that yesterday she had went to go check on him and he had fallen, but had fallen multiple other times overnight as well at the disarray of the house.  She states that he has been back and forth to the hospital and likely needs to go to inpatient rehab or some kind of rehabilitation prior to being sent home because he is not able to care for himself.   In the emergency department patient was noted to be afebrile with heart rates 51-62, blood pressures noted to be soft 98/54, and all other vital signs maintained.  CT scan of the head and cervical spine did not note any acute abnormalities.  Labs significant for WBC 13.1, hemoglobin 9.2, CO2 18, sodium 129, BUN 44, and creatinine 5.65.  Patient had been given 1 L of normal saline IV fluids.  Assessment and Plan:   Acute kidney injury superimposed on chronic kidney disease stage III Patient presents with creatinine elevated up to 5.65 with a BUN 44.  Baseline creatinine previously had been 1.73 when last discharge from hospital 10 days ago.   His creatinine improved with fluids. His nausea and diarrhea has improved This is likely pre-renal. Monitor strict I&Os and daily weights Continue to hold nephrotoxic agents entresto,  Lasix, aldactone. Patient to follow-up with his primary care provider for BMP in 3 days and adjustment of his medications. Oral intake has been encouraged.     Leukocytosis Patient reported having subjective fevers at home prior to arrival.  WBC elevate at 13.1 trended down.   Chest x-ray showed no acute abnormality.   Urinalysis noted trace leukocytes, rare bacteria, and  6-10 WBCs. Urine culture yielded less than 10,000 colonies which is not significant.  C. Difficile studies negative. No need for antibiotic at this time.  Acute gastroenteritis  Nausea, vomiting, diarrhea has resolved Patient reported intermittently having nausea, vomiting, and diarrhea.  Symptoms previously thought to be secondary to colitis and subsequently diverticulitis in the last 2 hospitalizations since last month for which patient was treated with antibiotics.   He denies any complaints of abdominal pain at this time. C diff negative.  CT scan of the abdomen pelvis reviewed showed improvement of previously seen diverticulitis, no acute abnormality.     Hyponatremia Acute.  Sodium 129.  Thought to be hypovolemic hyponatremia secondary to nausea, vomiting, diarrhea, and diuretic use. Sodium levels have improved with hydration.    Hypochromic Anemia  Acute on chronic. Hemoglobin noted to be 9.2, but previously had been 10.5 at discharge on 8/3.  Patient did not report any complaints of bleeding.  He is on anticoagulation of Xarelto. No active bleeding. Monitor h/h.    Falls at home Had multiple fall at home.  He denies any loss of consciousness, but did report having episodes of dizziness.   No acute intracranial injury appreciated. Seen and evaluated by PT and OT who recommended home health with patient declines.    Essential hypertension On admission blood pressures noted to be as low as 98/54.   Patient reported feeling lightheaded when getting up and moving around.   Patient likely needs adjustment to diuretic regimen as well as his cardiac meds Hold Entresto, spironolactone, and furosemide until seen by his primary care provider.    CAD Dyslipidemia Patient with a prior history of PCI.   Does not report any complaints of chest pain. Continue Crestor and Brilinta   Atrial flutter on chronic anticoagulation Continue amiodarone and Xarelto   Heart failure with  preserved EF Euvolemic.  Lasix, Aldactone, Jardiance, entresto held due to AKI. Strict I&Os and daily weights    Depression and anxiety Continue Wellbutrin, Paxil and Seroquel    History of colon cancer S/p right hemicolectomy with ileostomy anastomosis by Dr. Derrell Lolling on 08/23/2022 for right colon mass.  Pathology revealed invasive well to moderately differentiated adenocarcinoma. Outpatient follow up suggested.     OSA   No compliant with CPAP at night.   GERD Continue Protonix            Consultants: None Procedures performed: None  Disposition: Home Diet recommendation:  Discharge Diet Orders (From admission, onward)     Start     Ordered   10/12/22 0000  Diet - low sodium heart healthy        10/12/22 1042           Cardiac diet DISCHARGE MEDICATION: Allergies as of 10/12/2022       Reactions   Levitra [vardenafil] Other (See Comments)   Blurred vision   Aspirin Hives   Lipitor [atorvastatin] Other (See Comments)   Muscle cramps; patient states he does not take medication at home        Medication List     STOP taking these medications  furosemide 40 MG tablet Commonly known as: LASIX   potassium chloride SA 20 MEQ tablet Commonly known as: KLOR-CON M   sacubitril-valsartan 24-26 MG Commonly known as: ENTRESTO   spironolactone 25 MG tablet Commonly known as: ALDACTONE       TAKE these medications    Acetaminophen Extra Strength 500 MG Tabs Take 2 tablets (1,000 mg total) by mouth every 6 (six) hours. What changed:  when to take this reasons to take this   amiodarone 200 MG tablet Commonly known as: PACERONE Take 1/2 tablet (100 mg total) by mouth daily.   buPROPion 150 MG 24 hr tablet Commonly known as: WELLBUTRIN XL Take 1 tablet (150 mg total) by mouth daily.   CertaVite/Antioxidants Tabs Take 1 tablet by mouth daily.   cyanocobalamin 1000 MCG/ML injection Commonly known as: VITAMIN B12 Inject into the muscle.    empagliflozin 10 MG Tabs tablet Commonly known as: JARDIANCE Take 1 tablet (10 mg total) by mouth daily.   ferrous sulfate 325 (65 FE) MG EC tablet Take 1 tablet (325 mg total) by mouth 2 (two) times daily.   fluticasone 50 MCG/ACT nasal spray Commonly known as: FLONASE Place 1 spray into both nostrils daily as needed for allergies.   lactose free nutrition Liqd Take 237 mLs by mouth 2 (two) times daily between meals.   methocarbamol 500 MG tablet Commonly known as: ROBAXIN Take 1 tablet (500 mg total) by mouth every 8 (eight) hours as needed for muscle spasms.   ondansetron 4 MG tablet Commonly known as: Zofran Take 1 tablet (4 mg total) by mouth every 8 (eight) hours as needed for nausea or vomiting.   oxyCODONE 5 MG immediate release tablet Commonly known as: Oxy IR/ROXICODONE Take 1 tablet (5 mg total) by mouth every 6 (six) hours as needed for moderate pain.   pantoprazole 40 MG tablet Commonly known as: PROTONIX Take 1 tablet (40 mg total) by mouth daily.   PARoxetine 30 MG tablet Commonly known as: PAXIL Take 30 mg by mouth daily.   prasugrel 10 MG Tabs tablet Commonly known as: EFFIENT Take 10 mg by mouth daily.   pregabalin 150 MG capsule Commonly known as: LYRICA Take 1 capsule (150 mg total) by mouth 2 (two) times daily.   QUEtiapine 25 MG tablet Commonly known as: SEROQUEL Take 1 tablet (25 mg total) by mouth at bedtime.   rosuvastatin 20 MG tablet Commonly known as: CRESTOR Take 1 tablet (20 mg total) by mouth daily.   Xarelto 15 MG Tabs tablet Generic drug: Rivaroxaban Take 1 tablet (15 mg total) by mouth daily with supper.        Follow-up Information     [name], MD Follow up on 10/15/2022.   Specialty: Internal Medicine Why: Needs repeat BMP to document improvement in his renal function Contact information: [address] [place] [address] [phone]                Discharge Exam: Ceasar Mons Weights    10/09/22 1328 10/12/22 0500  Weight: 88.6 kg 88.9 kg   General - Elderly Caucasian weak male, no apparent distress HEENT - PERRLA, EOMI, atraumatic head, non tender sinuses. Lung - Clear, diffuse rhonchi. Heart - S1, S2 heard, no murmurs, rubs, trace pedal edema Neuro - Alert, awake and oriented x 3, non focal exam. Skin - Warm and dry.  Condition at discharge: stable  The results of significant diagnostics from this hospitalization (including imaging, microbiology, ancillary and laboratory)  are listed below for reference.   Imaging Studies: CT ABDOMEN PELVIS WO CONTRAST  Result Date: 10/09/2022 CLINICAL DATA:  History of diverticulitis EXAM: CT ABDOMEN AND PELVIS WITHOUT CONTRAST TECHNIQUE: Multidetector CT imaging of the abdomen and pelvis was performed following the standard protocol without IV contrast. RADIATION DOSE REDUCTION: This exam was performed according to the departmental dose-optimization program which includes automated exposure control, adjustment of the mA and/or kV according to patient size and/or use of iterative reconstruction technique. COMPARISON:  09/27/2022 FINDINGS: Lower chest: Mild dependent atelectatic changes are seen. Hepatobiliary: No focal liver abnormality is seen. Status post cholecystectomy. No biliary dilatation. Pancreas: Unremarkable. No pancreatic ductal dilatation or surrounding inflammatory changes. Spleen: Normal in size without focal abnormality. Adrenals/Urinary Tract: Adrenal glands are within normal limits. Left kidney has been surgically removed. Right kidney is stable in appearance. No obstructive changes are seen. The bladder is well distended. Stomach/Bowel: Fecal material is noted within the colon. Mild diverticular changes noted without evidence of diverticulitis. No evidence of perforation is seen. Changes of prior right hemicolectomy are noted. The ileocolic anastomosis is widely patent. The appendix is not visualized consistent with the  prior surgical history. The stomach is unremarkable. Vascular/Lymphatic: Aortic atherosclerosis. No enlarged abdominal or pelvic lymph nodes. Reproductive: Prostate is unremarkable. Other: No abdominal wall hernia or abnormality. No abdominopelvic ascites. Musculoskeletal: No acute or significant osseous findings. IMPRESSION: Previously seen mild diverticulitis has resolved. No abscess or free fluid is seen. Postsurgical changes are noted. No acute abnormality noted. Electronically Signed   By: Alcide Clever M.D.   On: 10/09/2022 23:54   DG Chest 2 View  Result Date: 10/09/2022 CLINICAL DATA:  fall EXAM: CHEST - 2 VIEW COMPARISON:  09/10/2022 FINDINGS: Cardiomediastinal silhouette and pulmonary vasculature are within normal limits. Lungs are clear. IMPRESSION: No acute cardiopulmonary process. Electronically Signed   By: Acquanetta Belling M.D.   On: 10/09/2022 16:02   CT HEAD WO CONTRAST ( )  Result Date: 10/09/2022 CLINICAL DATA:  Fall at 2 a.m. this morning EXAM: CT HEAD WITHOUT CONTRAST CT CERVICAL SPINE WITHOUT CONTRAST TECHNIQUE: Multidetector CT imaging of the head and cervical spine was performed following the standard protocol without intravenous contrast. Multiplanar CT image reconstructions of the cervical spine were also generated. RADIATION DOSE REDUCTION: This exam was performed according to the departmental dose-optimization program which includes automated exposure control, adjustment of the mA and/or kV according to patient size and/or use of iterative reconstruction technique. COMPARISON:  None Available. FINDINGS: CT HEAD FINDINGS Brain: No evidence of acute infarction, hemorrhage, hydrocephalus, extra-axial collection or mass lesion/mass effect. Periventricular and deep white matter hypodensity. Vascular: No hyperdense vessel or unexpected calcification. Skull: Normal. Negative for fracture or focal lesion. Sinuses/Orbits: No acute finding. Other: None. CT CERVICAL SPINE FINDINGS Alignment:  Normal. Skull base and vertebrae: No acute fracture. No primary bone lesion or focal pathologic process. Soft tissues and spinal canal: No prevertebral fluid or swelling. No visible canal hematoma. Disc levels: Focally moderate disc space height loss and osteophytosis of C6-C7 with otherwise mild disc degenerative change. Upper chest: Negative. Other: None. IMPRESSION: 1. No acute intracranial pathology. Small-vessel white matter disease. 2. No fracture or static subluxation of the cervical spine. 3. Focally moderate disc space height loss and osteophytosis of C6-C7 with otherwise mild disc degenerative change. Electronically Signed   By: Jearld Lesch M.D.   On: 10/09/2022 15:34   CT Cervical Spine Wo Contrast  Result Date: 10/09/2022 CLINICAL DATA:  Fall at 2 a.m. this morning  EXAM: CT HEAD WITHOUT CONTRAST CT CERVICAL SPINE WITHOUT CONTRAST TECHNIQUE: Multidetector CT imaging of the head and cervical spine was performed following the standard protocol without intravenous contrast. Multiplanar CT image reconstructions of the cervical spine were also generated. RADIATION DOSE REDUCTION: This exam was performed according to the departmental dose-optimization program which includes automated exposure control, adjustment of the mA and/or kV according to patient size and/or use of iterative reconstruction technique. COMPARISON:  None Available. FINDINGS: CT HEAD FINDINGS Brain: No evidence of acute infarction, hemorrhage, hydrocephalus, extra-axial collection or mass lesion/mass effect. Periventricular and deep white matter hypodensity. Vascular: No hyperdense vessel or unexpected calcification. Skull: Normal. Negative for fracture or focal lesion. Sinuses/Orbits: No acute finding. Other: None. CT CERVICAL SPINE FINDINGS Alignment: Normal. Skull base and vertebrae: No acute fracture. No primary bone lesion or focal pathologic process. Soft tissues and spinal canal: No prevertebral fluid or swelling. No visible  canal hematoma. Disc levels: Focally moderate disc space height loss and osteophytosis of C6-C7 with otherwise mild disc degenerative change. Upper chest: Negative. Other: None. IMPRESSION: 1. No acute intracranial pathology. Small-vessel white matter disease. 2. No fracture or static subluxation of the cervical spine. 3. Focally moderate disc space height loss and osteophytosis of C6-C7 with otherwise mild disc degenerative change. Electronically Signed   By: Jearld Lesch M.D.   On: 10/09/2022 15:34   CT ABDOMEN PELVIS WO CONTRAST  Result Date: 09/27/2022 CLINICAL DATA:  Nonlocalized abdominal pain. EXAM: CT ABDOMEN AND PELVIS WITHOUT CONTRAST TECHNIQUE: Multidetector CT imaging of the abdomen and pelvis was performed following the standard protocol without IV contrast. RADIATION DOSE REDUCTION: This exam was performed according to the departmental dose-optimization program which includes automated exposure control, adjustment of the mA and/or kV according to patient size and/or use of iterative reconstruction technique. COMPARISON:  09/10/2022 FINDINGS: Lower chest: Unremarkable. Hepatobiliary: No suspicious focal abnormality in the liver on this study without intravenous contrast. Gallbladder is surgically absent. No intrahepatic or extrahepatic biliary dilation. Pancreas: No focal mass lesion. No dilatation of the main duct. No intraparenchymal cyst. No peripancreatic edema. Spleen: No splenomegaly. No suspicious focal mass lesion. Adrenals/Urinary Tract: No adrenal nodule or mass. Small cyst lower pole right kidney is stable. No followup imaging is recommended. Right ureter unremarkable. Left nephrectomy. The urinary bladder appears normal for the degree of distention. Stomach/Bowel: Tiny hiatal hernia. Stomach otherwise unremarkable. Duodenum is normally positioned as is the ligament of Treitz. Multiple duodenal diverticuli noted. No small bowel wall thickening. No small bowel dilatation. Status post right  hemicolectomy. Stranding in the fat around the ileocolic anastomosis is similar to prior. Diverticular disease is noted in the left colon. There is some subtle pericolonic edema along the distal descending colon (images 41-47 of series 2) that is new in the interval suggesting diverticulitis. Vascular/Lymphatic: There is moderate atherosclerotic calcification of the abdominal aorta without aneurysm. There is no gastrohepatic or hepatoduodenal ligament lymphadenopathy. No retroperitoneal or mesenteric lymphadenopathy. No pelvic sidewall lymphadenopathy. Reproductive: The prostate gland and seminal vesicles are unremarkable. Other: No intraperitoneal free fluid. Musculoskeletal: No worrisome lytic or sclerotic osseous abnormality. Stable appearance L1 superior endplate compression deformity. IMPRESSION: 1. Left colonic diverticulosis with some subtle pericolonic edema along the distal descending colon, new in the interval suggesting diverticulitis. No evidence for perforation or abscess. 2. Status post right hemicolectomy with stable appearance of stranding in the fat around the ileocolic anastomosis. Given the relative stability of this finding, it may reflect scarring. 3. Status post left nephrectomy. 4. Tiny hiatal  hernia. 5.  Aortic Atherosclerosis (ICD10-I70.0). Electronically Signed   By: Kennith Center M.D.   On: 09/27/2022 15:41    Microbiology: Results for orders placed or performed during the hospital encounter of 10/09/22  Urine Culture (for pregnant, neutropenic or urologic patients or patients with an indwelling urinary catheter)     Status: Abnormal   Collection Time: 10/09/22  3:30 PM   Specimen: Urine, Clean Catch  Result Value Ref Range Status   Specimen Description URINE, CLEAN CATCH  Final   Special Requests NONE  Final   Culture (A)  Final    <10,000 COLONIES/mL INSIGNIFICANT GROWTH Performed at [facility], [address] [place], [place] [address]    Report Status 10/12/2022  FINAL  Final  C Difficile Quick Screen w PCR reflex     Status: None   Collection Time: 10/09/22  4:38 PM   Specimen: STOOL  Result Value Ref Range Status   C Diff antigen NEGATIVE NEGATIVE Final   C Diff toxin NEGATIVE NEGATIVE Final   C Diff interpretation No C. difficile detected.  Final    Comment: Performed at [facility], [address] [place], [place] [address]    Labs: CBC: Recent Labs  Lab 10/09/22 1414 10/09/22 1844 10/10/22 0624 10/11/22 0129 10/12/22 0842  WBC 13.1*  --  8.3 8.2 7.1  NEUTROABS 7.1  --   --   --   --   HGB 9.2* 10.4* 10.3* 9.7* 9.6*  HCT 29.4* 32.8* 32.0* 29.6* 30.6*  MCV 82.8  --  82.7 82.5 83.6  PLT 227  --  195 203 184   Basic Metabolic Panel: Recent Labs  Lab 10/09/22 1414 10/10/22 0624 10/11/22 0129  NA 129* 133* 138  K 4.1 3.6 4.0  CL 97* 102 109  CO2 18* 19* 21*  GLUCOSE 92 96 99  BUN 44* 37* 26*  CREATININE 5.65* 4.18* 2.70*  CALCIUM 8.1* 7.9* 8.0*   Liver Function Tests: Recent Labs  Lab 10/09/22 1414  AST 27  ALT 19  ALKPHOS 52  BILITOT 0.5  PROT 5.7*  ALBUMIN 3.3*   CBG: No results for input(s): "GLUCAP" in the last 168 hours.  Discharge time spent: greater than 30 minutes.  Signed: Lucile Shutters, MD [facility] 10/12/2022

## 2022-10-12 NOTE — Progress Notes (Signed)
Physical Therapy Treatment Patient Details Name: Brendan Butler MRN: [id number] DOB: [birth year] Today's Date: 10/12/2022   History of Present Illness 72 y.o. male presents to [facility] hospital on 10/09/2022 after a fall at home. Pt reports recent lightheadedness with positional changes, along with continued nausea, vomiting and diarrhea. PMH includes aflutter, CAD, CKD III, L nephrectomy, CVA, HFrEF, HLD, HTN, OSA.    PT Comments  Pt with good participation, received supine and modI for bed mobility, transfers and gait without AD, mildly increased time to perform, good apparent safety awareness today. Pt performed stairs with Supervision and only intermittent cues, no LOB, no acute s/sx distress. Pt given HEP and Fall Risk Prevention handouts to reinforce education given during session. Emphasis on activity pacing and self-monitoring for signs/symptoms of orthostatic hypotension, pt agreeable to obtain compression socks upon DC to ensure hemodynamic stability as he states this has helped in the past. Pt continues to benefit from PT services to progress toward functional mobility goals.     If plan is discharge home, recommend the following: A little help with bathing/dressing/bathroom;Direct supervision/assist for medications management;Direct supervision/assist for financial management;Assist for transportation;Supervision due to cognitive status;Help with stairs or ramp for entrance;Assistance with cooking/housework   Can travel by private vehicle        Equipment Recommendations  None recommended by PT    Recommendations for Other Services       Precautions / Restrictions Precautions Precautions: Fall Precaution Comments: Enteric precs Restrictions Weight Bearing Restrictions: No     Mobility  Bed Mobility Overal bed mobility: Modified Independent             General bed mobility comments: from flat bed/no rails    Transfers Overall transfer level: Modified  independent Equipment used: None Transfers: Sit to/from Stand Sit to Stand: Modified independent (Device/Increase time)           General transfer comment: pt using hands for support    Ambulation/Gait Ambulation/Gait assistance: Modified independent (Device/Increase time) Gait Distance (Feet): 200 Feet Assistive device: None Gait Pattern/deviations: Step-through pattern, WFL(Within Functional Limits)       General Gait Details: fair pace, no LOB with higher level balance tasks, no c/o lightheadedness; modI increased time, pt intermittently taps furniture in narrow areas of room but does not appear to need steadying assist; appears habitual.   Stairs   Stairs assistance: Supervision Stair Management: Step to pattern, Forwards, One rail Right, Alternating pattern, Backwards Number of Stairs: 4 General stair comments: alternating pattern ascending forward, step-to pattern descending after safety cues given, pt descending backward by choice; 4 steps with R rail per home set-up   Wheelchair Mobility     Tilt Bed    Modified Rankin (Stroke Patients Only)       Balance Overall balance assessment: Needs assistance Sitting-balance support: No upper extremity supported, Feet supported Sitting balance-Leahy Scale: Good     Standing balance support: No upper extremity supported, During functional activity Standing balance-Leahy Scale: Good Standing balance comment: no LOB without AD and higher level balance tasks                            Cognition Arousal: Alert Behavior During Therapy: WFL for tasks assessed/performed Overall Cognitive Status: Within Functional Limits for tasks assessed  General Comments: A&O, pt appears to have insight into deficits, following 1 and 2-step commands.        Exercises Other Exercises Other Exercises: pt given supine/standing HEP handout and "Falls at Home Prevention"  handout, encouraged him to review and implement strategies as needed to prevent falls/readmission.    General Comments General comments (skin integrity, edema, etc.): VSS per chart review, no acute s/sx distress, reviewed self-monitoring wtih all mobility for signs/symptoms of orthostatic hypotension and making sure he is hydrated given recent fall after BLE buckling.      Pertinent Vitals/Pain Pain Assessment Pain Assessment: No/denies pain           PT Goals (current goals can now be found in the care plan section) Acute Rehab PT Goals Patient Stated Goal: to go home PT Goal Formulation: With patient Time For Goal Achievement: 10/24/22 Progress towards PT goals: Progressing toward goals    Frequency    Min 1X/week      PT Plan         AM-PAC PT "6 Clicks" Mobility   Outcome Measure  Help needed turning from your back to your side while in a flat bed without using bedrails?: None Help needed moving from lying on your back to sitting on the side of a flat bed without using bedrails?: None Help needed moving to and from a bed to a chair (including a wheelchair)?: None Help needed standing up from a chair using your arms (e.g., wheelchair or bedside chair)?: None Help needed to walk in hospital room?: None Help needed climbing 3-5 steps with a railing? : A Little 6 Click Score: 23    End of Session Equipment Utilized During Treatment: Gait belt Activity Tolerance: Patient tolerated treatment well Patient left: Other (comment);in bed;with call bell/phone within reach (RN OK to leave alarm off as pt without orthostatic symptoms and no instabilty during hallway mobility/stairs) Nurse Communication: Mobility status PT Visit Diagnosis: Other abnormalities of gait and mobility (R26.89);History of falling (Z91.81)     Time: 8657-8469 PT Time Calculation (min) (ACUTE ONLY): 8 min  Charges:    $Therapeutic Activity: 8-22 mins PT General Charges $$ ACUTE PT VISIT: 1  Visit                     Arryana Tolleson P., PTA Acute Rehabilitation Services Secure Chat Preferred 9a-5:30pm Office: [phone]    Dorathy Kinsman [facility] 10/12/2022, 2:53 PM

## 2022-10-23 ENCOUNTER — Emergency Department (HOSPITAL_COMMUNITY)
Admission: EM | Admit: 2022-10-23 | Discharge: 2022-10-23 | Disposition: A | Discharge status: Home / Self Care | Payer: Medicare HMO

## 2022-10-23 ENCOUNTER — Emergency Department (HOSPITAL_BASED_OUTPATIENT_CLINIC_OR_DEPARTMENT_OTHER): Payer: Medicare HMO

## 2022-10-23 ENCOUNTER — Emergency Department (HOSPITAL_COMMUNITY): Payer: Medicare HMO

## 2022-10-23 ENCOUNTER — Other Ambulatory Visit: Payer: Self-pay

## 2022-10-23 ENCOUNTER — Other Ambulatory Visit (HOSPITAL_COMMUNITY): Payer: Self-pay

## 2022-10-23 ENCOUNTER — Inpatient Hospital Stay: Payer: Medicare HMO | Admitting: Oncology

## 2022-10-23 DIAGNOSIS — M7989 Other specified soft tissue disorders: Secondary | ICD-10-CM | POA: Diagnosis not present

## 2022-10-23 DIAGNOSIS — I509 Heart failure, unspecified: Secondary | ICD-10-CM | POA: Diagnosis not present

## 2022-10-23 DIAGNOSIS — L03115 Cellulitis of right lower limb: Secondary | ICD-10-CM | POA: Diagnosis not present

## 2022-10-23 DIAGNOSIS — Z7984 Long term (current) use of oral hypoglycemic drugs: Secondary | ICD-10-CM | POA: Insufficient documentation

## 2022-10-23 DIAGNOSIS — E119 Type 2 diabetes mellitus without complications: Secondary | ICD-10-CM | POA: Diagnosis not present

## 2022-10-23 DIAGNOSIS — R600 Localized edema: Secondary | ICD-10-CM

## 2022-10-23 DIAGNOSIS — L03116 Cellulitis of left lower limb: Secondary | ICD-10-CM | POA: Insufficient documentation

## 2022-10-23 DIAGNOSIS — Z7901 Long term (current) use of anticoagulants: Secondary | ICD-10-CM | POA: Insufficient documentation

## 2022-10-23 DIAGNOSIS — L03119 Cellulitis of unspecified part of limb: Secondary | ICD-10-CM

## 2022-10-23 DIAGNOSIS — E1122 Type 2 diabetes mellitus with diabetic chronic kidney disease: Secondary | ICD-10-CM | POA: Diagnosis not present

## 2022-10-23 DIAGNOSIS — I4891 Unspecified atrial fibrillation: Secondary | ICD-10-CM | POA: Insufficient documentation

## 2022-10-23 LAB — CBC
HCT: 29.9 % — ABNORMAL LOW (ref 39.0–52.0)
Hemoglobin: 9.1 g/dL — ABNORMAL LOW (ref 13.0–17.0)
MCH: 26.2 pg (ref 26.0–34.0)
MCHC: 30.4 g/dL (ref 30.0–36.0)
MCV: 86.2 fL (ref 80.0–100.0)
Platelets: 194 10*3/uL (ref 150–400)
RBC: 3.47 MIL/uL — ABNORMAL LOW (ref 4.22–5.81)
RDW: 21.5 % — ABNORMAL HIGH (ref 11.5–15.5)
WBC: 7.8 10*3/uL (ref 4.0–10.5)
nRBC: 0 % (ref 0.0–0.2)

## 2022-10-23 LAB — TROPONIN I (HIGH SENSITIVITY)
Troponin I (High Sensitivity): 6 ng/L (ref <18)
Troponin I (High Sensitivity): 6 ng/L (ref <18)

## 2022-10-23 LAB — BASIC METABOLIC PANEL
Anion gap: 13 (ref 5–15)
BUN: 14 mg/dL (ref 8–23)
CO2: 22 mmol/L (ref 22–32)
Calcium: 8.3 mg/dL — ABNORMAL LOW (ref 8.9–10.3)
Chloride: 106 mmol/L (ref 98–111)
Creatinine, Ser: 1.38 mg/dL — ABNORMAL HIGH (ref 0.61–1.24)
GFR, Estimated: 55 mL/min — ABNORMAL LOW (ref >60)
Glucose, Bld: 96 mg/dL (ref 70–99)
Potassium: 3.8 mmol/L (ref 3.5–5.1)
Sodium: 141 mmol/L (ref 135–145)

## 2022-10-23 MED ORDER — CEPHALEXIN 500 MG PO CAPS
500.0000 mg | ORAL_CAPSULE | Freq: Four times a day (QID) | ORAL | 0 refills | Status: DC
  Filled 2022-10-23: qty 28, 7d supply, fill #0

## 2022-10-23 MED ORDER — FUROSEMIDE 10 MG/ML IJ SOLN
40.0000 mg | Freq: Once | INTRAMUSCULAR | Status: AC
  Administered 2022-10-23: 40 mg via INTRAVENOUS
  Filled 2022-10-23: qty 4

## 2022-10-23 MED ORDER — CEPHALEXIN 250 MG PO CAPS
500.0000 mg | ORAL_CAPSULE | Freq: Once | ORAL | Status: AC
  Administered 2022-10-23: 500 mg via ORAL
  Filled 2022-10-23: qty 2

## 2022-10-23 MED ORDER — FUROSEMIDE 40 MG PO TABS
40.0000 mg | ORAL_TABLET | Freq: Every day | ORAL | 0 refills | Status: DC
Start: 2022-10-23 — End: 2023-07-26
  Filled 2022-10-23 (×2): qty 10, 10d supply, fill #0

## 2022-10-23 NOTE — ED Provider Notes (Signed)
EMERGENCY DEPARTMENT AT [facility] Provider Note   CSN: [id number] Arrival date & time: 10/23/22  4098     History  Chief Complaint  Patient presents with   Leg Pain   fluid on left leg    Brendan Butler is a 72 y.o. male.  72 year old male with past medical history of atrial fibrillation, diabetes, and CHF presenting to the emergency department today with concern for right greater than left leg swelling.  The patient states that this been going on for the past few days.  He states that he was on Lasix in the past and this was discontinued due to acute kidney injury.  He has not restarted this.  He states that over the past few days that he is noted some worsening right leg swelling.  Reports that this does usually get swollen more than the left and his experience when he is volume overloaded.  He does report that he is feeling well otherwise.  He denies any significant shortness of breath or dyspnea on exertion with this.  He denies a history of DVT or pulmonary embolism, recent surgeries, recent travel.  Denies any chest pain.  He came to the emergency department today for further evaluation regarding this.   Leg Pain      Home Medications Prior to Admission medications   Medication Sig Start Date End Date Taking? Authorizing Provider  cephALEXin (KEFLEX) 500 MG capsule Take 1 capsule (500 mg total) by mouth 4 (four) times daily. 10/23/22  Yes Durwin Glaze, MD  furosemide (LASIX) 40 MG tablet Take 1 tablet (40 mg total) by mouth daily. 10/23/22  Yes Durwin Glaze, MD  acetaminophen (TYLENOL) 500 MG tablet Take 2 tablets (1,000 mg total) by mouth every 6 (six) hours. Patient taking differently: Take 1,000 mg by mouth daily as needed for moderate pain. 08/28/22   Swayze, Ava, DO  amiodarone (PACERONE) 200 MG tablet Take 1/2 tablet (100 mg total) by mouth daily. 08/29/22   Swayze, Ava, DO  buPROPion (WELLBUTRIN XL) 150 MG 24 hr tablet Take 1 tablet (150 mg total)  by mouth daily. 05/24/22   Elgergawy, Leana Roe, MD  cyanocobalamin (VITAMIN B12) 1000 MCG/ML injection Inject into the muscle. 09/01/22   [provider]  empagliflozin (JARDIANCE) 10 MG TABS tablet Take 1 tablet (10 mg total) by mouth daily. 06/18/22   Marjie Skiff E, PA-C  ferrous sulfate 325 (65 FE) MG EC tablet Take 1 tablet (325 mg total) by mouth 2 (two) times daily. 07/31/22 09/27/22  Jerald Kief, MD  fluticasone (FLONASE) 50 MCG/ACT nasal spray Place 1 spray into both nostrils daily as needed for allergies. 12/24/21   [provider]  lactose free nutrition (BOOST PLUS) LIQD Take 237 mLs by mouth 2 (two) times daily between meals. 08/29/22   Swayze, Ava, DO  methocarbamol (ROBAXIN) 500 MG tablet Take 1 tablet (500 mg total) by mouth every 8 (eight) hours as needed for muscle spasms. 08/28/22   Swayze, Ava, DO  Multiple Vitamin (MULTIVITAMIN WITH MINERALS) TABS tablet Take 1 tablet by mouth daily. 08/29/22   Swayze, Ava, DO  ondansetron (ZOFRAN) 4 MG tablet Take 1 tablet (4 mg total) by mouth every 8 (eight) hours as needed for nausea or vomiting. 09/13/22 09/13/23  Rai, Ripudeep K, MD  oxyCODONE (OXY IR/ROXICODONE) 5 MG immediate release tablet Take 1 tablet (5 mg total) by mouth every 6 (six) hours as needed for moderate pain. 08/28/22   Swayze,  Ava, DO  pantoprazole (PROTONIX) 40 MG tablet Take 1 tablet (40 mg total) by mouth daily. 05/11/22   Pokhrel, Rebekah Chesterfield, MD  PARoxetine (PAXIL) 30 MG tablet Take 30 mg by mouth daily.    [provider]  prasugrel (EFFIENT) 10 MG TABS tablet Take 10 mg by mouth daily. 09/12/22   [provider]  pregabalin (LYRICA) 150 MG capsule Take 1 capsule (150 mg total) by mouth 2 (two) times daily. 05/24/22   Elgergawy, Leana Roe, MD  QUEtiapine (SEROQUEL) 25 MG tablet Take 1 tablet (25 mg total) by mouth at bedtime. 05/10/22   Pokhrel, Rebekah Chesterfield, MD  Rivaroxaban (XARELTO) 15 MG TABS tablet Take 1 tablet (15 mg total) by mouth daily with supper.  08/28/22   Swayze, Ava, DO  rosuvastatin (CRESTOR) 20 MG tablet Take 1 tablet (20 mg total) by mouth daily. 06/18/22   Corrin Parker, PA-C      Allergies    Levitra [vardenafil], Aspirin, and Lipitor [atorvastatin]    Review of Systems   Review of Systems Gen: No fevers Eyes: No vision changes HEENT: no congestion, sore throat Neck: no neck stiffness Resp: no cough, shortness of breath Card: no chest pain Abd: no nausea or vomiting, no abdominal pain Extremities: See HPI Neuro: no weakness, numbness, tingling Skin: no rashes  Physical Exam Updated Vital Signs BP (!) 161/68   Pulse (!) 56   Temp 97.9 F (36.6 C) (Oral)   Resp 16   SpO2 99%  Physical Exam Gen: NAD Eyes: PERRL, EOMI HEENT: no oropharyngeal swelling Neck: trachea midline Resp: Manage the bilateral lung bases Card: RRR, no murmurs, rubs, or gallops Abd: nontender, nondistended Extremities: The right leg is slightly more swollen compared to the left with some mild overlying erythema Vascular: 2+ radial pulses bilaterally, 2+ DP pulses bilaterally Skin: no rashes Psyc: acting appropriately  ED Results / Procedures / Treatments   Labs (all labs ordered are listed, but only abnormal results are displayed) Labs Reviewed  BASIC METABOLIC PANEL - Abnormal; Notable for the following components:      Result Value   Creatinine, Ser 1.38 (*)    Calcium 8.3 (*)    GFR, Estimated 55 (*)    All other components within normal limits  CBC - Abnormal; Notable for the following components:   RBC 3.47 (*)    Hemoglobin 9.1 (*)    HCT 29.9 (*)    RDW 21.5 (*)    All other components within normal limits  TROPONIN I (HIGH SENSITIVITY)  TROPONIN I (HIGH SENSITIVITY)    EKG None  Radiology VAS [place] LOWER EXTREMITY VENOUS (DVT) (7a-7p)  Result Date: 10/23/2022  Lower Venous DVT Study Patient Name:  BENTZION SAXBY  Date of Exam:   10/23/2022 Medical Rec #: [id number]         Accession #:    [id number] Date of  Birth: [birth year]        Patient Gender: M Patient Age:   72 years Exam Location:  [facility] Procedure:      VAS [place] LOWER EXTREMITY VENOUS (DVT) Referring Phys: Greig Castilla Kyvon Hu --------------------------------------------------------------------------------  Indications: Chronic RT>LT edema, CHF.  Comparison Study: 08-17-2022 Negative right lower extremity venous examination.                   07-30-2022 Negative right lower extremity venous examination. Performing Technologist: Jean Rosenthal RDMS, RVT  Examination Guidelines: A complete evaluation includes B-mode imaging, spectral Doppler, color Doppler, and power Doppler as  needed of all accessible portions of each vessel. Bilateral testing is considered an integral part of a complete examination. Limited examinations for reoccurring indications may be performed as noted. The reflux portion of the exam is performed with the patient in reverse Trendelenburg.  +---------+---------------+---------+-----------+----------+--------------+ RIGHT    CompressibilityPhasicitySpontaneityPropertiesThrombus Aging +---------+---------------+---------+-----------+----------+--------------+ CFV      Full           Yes      Yes                                 +---------+---------------+---------+-----------+----------+--------------+ SFJ      Full                                                        +---------+---------------+---------+-----------+----------+--------------+ FV Prox  Full                                                        +---------+---------------+---------+-----------+----------+--------------+ FV Mid   Full                                                        +---------+---------------+---------+-----------+----------+--------------+ FV DistalFull                                                        +---------+---------------+---------+-----------+----------+--------------+ PFV      Full                                                         +---------+---------------+---------+-----------+----------+--------------+ POP      Full           Yes      Yes                                 +---------+---------------+---------+-----------+----------+--------------+ PTV      Full                                                        +---------+---------------+---------+-----------+----------+--------------+ PERO     Full                                                        +---------+---------------+---------+-----------+----------+--------------+ Gastroc  Full                                                        +---------+---------------+---------+-----------+----------+--------------+   +----+---------------+---------+-----------+----------+--------------+  LEFTCompressibilityPhasicitySpontaneityPropertiesThrombus Aging +----+---------------+---------+-----------+----------+--------------+ CFV Full           Yes      Yes                                 +----+---------------+---------+-----------+----------+--------------+    Summary: RIGHT: - There is no evidence of deep vein thrombosis in the lower extremity.  - No cystic structure found in the popliteal fossa.  LEFT: - No evidence of common femoral vein obstruction.   *See table(s) above for measurements and observations.    Preliminary    DG Chest Port 1 View  Result Date: 10/23/2022 CLINICAL DATA:  Congestive heart failure, leg swelling. EXAM: PORTABLE CHEST 1 VIEW COMPARISON:  October 09, 2022. FINDINGS: The heart size and mediastinal contours are within normal limits. Both lungs are clear. The visualized skeletal structures are unremarkable. IMPRESSION: No active disease. Electronically Signed   By: Lupita Raider M.D.   On: 10/23/2022 09:26    Procedures Procedures    Medications Ordered in ED Medications  furosemide (LASIX) injection 40 mg (40 mg Intravenous Given 10/23/22 1157)  cephALEXin (KEFLEX)  capsule 500 mg (500 mg Oral Given 10/23/22 1155)    ED Course/ Medical Decision Making/ A&P                                 Medical Decision Making 72 year old male with past medical history of atrial fibrillation and CHF as well as diabetes presents the emergency department today with lower extremity edema.  I will further evaluate the patient here with basic labs well as a chest x-ray to evaluate for underlying CHF.  I will also obtain an ultrasound to evaluate for DVT.  The patient is otherwise well-appearing.  He does have a reassuring neurovascular exam so this does not appear to be secondary to arterial occlusion.  There is some mild overlying erythema which does seem to be consistent with venous stasis.  I suspect that he is more volume overloaded as he does have some edema on the left as well so if his renal function is getting back to normal we will consider starting him back on his Lasix and potentially at a lower dose.  I will reevaluate for ultimate disposition.  The patient's EKG interpreted by me shows a sinus rhythm with a rate of 66 with PAC with normal axis, normal intervals, and no significant ST-T changes.  The patient's work appears reassuring.  His renal function has improved back to his baseline.  His chest x-ray does not show any significant edema.  Ultrasound does show some lymphadenopathy but no DVT on the preliminary read from the ultrasound tech.  The patient does have some mild erythema.  I will give him Keflex for possible developing cellulitis.  He does appear volume overloaded overall both legs swollen so I will start him back on Lasix.  I have reviewed his medical records and it appears that he was on 40 mg twice a day as well as Aldactone.  I will start him back on 40 mg once a day of the Lasix.  He does have an appoint with his primary care provider.  He is not requiring any oxygen.  I think that he is stable for discharge.  He is encouraged to follow-up with his doctor  tomorrow to have his labs rechecked and to discuss further management  of his diuretics.  Amount and/or Complexity of Data Reviewed Labs: ordered. Radiology: ordered.  Risk Prescription drug management.           Final Clinical Impression(s) / ED Diagnoses Final diagnoses:  Lower extremity edema  Cellulitis of lower extremity, unspecified laterality  Disposition: discharge  Rx / DC Orders ED Discharge Orders          Ordered    cephALEXin (KEFLEX) 500 MG capsule  4 times daily        10/23/22 1211    furosemide (LASIX) 40 MG tablet  Daily        10/23/22 1211              Durwin Glaze, MD 10/23/22 1213

## 2022-10-23 NOTE — ED Notes (Signed)
Pt resting with eyes closed; respirations spontaneous, even, unlabored 

## 2022-10-23 NOTE — Discharge Instructions (Addendum)
Your workup today was reassuring.  Your kidney function has improved.  Placed her back on the Lasix at 40 mg once per day.  Follow-up with your doctor tomorrow to discuss how they would like to manage this in the future.  It does appear that you were on this twice a day prior to coming in the hospital last time.  Please take the Keflex for possible cellulitis of your right lower extremity.  Please return to the emergency department for worsening symptoms.

## 2022-10-23 NOTE — ED Triage Notes (Signed)
Pt. Stated, Im here every other week for something. Im having fluid on left leg. Left leg is swollen and warm to touch. I have CHF and Im not taking anything for my heart cause its bad on my kidneys.

## 2022-10-24 DIAGNOSIS — I509 Heart failure, unspecified: Secondary | ICD-10-CM | POA: Diagnosis not present

## 2022-10-24 DIAGNOSIS — R600 Localized edema: Secondary | ICD-10-CM | POA: Diagnosis not present

## 2022-10-24 DIAGNOSIS — Z189 Retained foreign body fragments, unspecified material: Secondary | ICD-10-CM | POA: Diagnosis not present

## 2022-10-24 DIAGNOSIS — L0390 Cellulitis, unspecified: Secondary | ICD-10-CM | POA: Diagnosis not present

## 2022-10-30 ENCOUNTER — Encounter (HOSPITAL_COMMUNITY)
Admission: RE | Admit: 2022-10-30 | Discharge: 2022-10-30 | Disposition: A | Discharge status: Home / Self Care | Payer: Medicare HMO | Source: Ambulatory Visit | Attending: Internal Medicine | Admitting: Internal Medicine

## 2022-10-30 DIAGNOSIS — C189 Malignant neoplasm of colon, unspecified: Secondary | ICD-10-CM | POA: Insufficient documentation

## 2022-10-31 DIAGNOSIS — D649 Anemia, unspecified: Secondary | ICD-10-CM | POA: Diagnosis not present

## 2022-10-31 DIAGNOSIS — R600 Localized edema: Secondary | ICD-10-CM | POA: Diagnosis not present

## 2022-10-31 DIAGNOSIS — I509 Heart failure, unspecified: Secondary | ICD-10-CM | POA: Diagnosis not present

## 2022-10-31 DIAGNOSIS — N1832 Chronic kidney disease, stage 3b: Secondary | ICD-10-CM | POA: Diagnosis not present

## 2022-11-05 ENCOUNTER — Encounter: Payer: Self-pay | Admitting: *Deleted

## 2022-11-05 ENCOUNTER — Inpatient Hospital Stay: Payer: Medicare HMO | Attending: Oncology | Admitting: Oncology

## 2022-11-06 ENCOUNTER — Encounter (HOSPITAL_COMMUNITY)
Admission: RE | Admit: 2022-11-06 | Discharge: 2022-11-06 | Disposition: A | Discharge status: Home / Self Care | Payer: Medicare HMO | Source: Ambulatory Visit | Attending: Internal Medicine | Admitting: Internal Medicine

## 2022-11-06 DIAGNOSIS — Z85038 Personal history of other malignant neoplasm of large intestine: Secondary | ICD-10-CM | POA: Insufficient documentation

## 2022-11-06 DIAGNOSIS — C189 Malignant neoplasm of colon, unspecified: Secondary | ICD-10-CM | POA: Insufficient documentation

## 2022-11-06 LAB — GLUCOSE, CAPILLARY: Glucose-Capillary: 119 mg/dL — ABNORMAL HIGH (ref 70–99)

## 2022-11-06 MED ORDER — FLUDEOXYGLUCOSE F - 18 (FDG) INJECTION
10.0900 | Freq: Once | INTRAVENOUS | Status: AC
  Administered 2022-11-06: 10.09 via INTRAVENOUS

## 2022-11-08 ENCOUNTER — Encounter: Payer: Self-pay | Admitting: *Deleted

## 2022-11-12 ENCOUNTER — Other Ambulatory Visit (HOSPITAL_COMMUNITY): Payer: Self-pay

## 2022-11-15 ENCOUNTER — Telehealth: Payer: Self-pay | Admitting: *Deleted

## 2022-11-16 ENCOUNTER — Telehealth: Payer: Self-pay | Admitting: *Deleted

## 2022-11-16 ENCOUNTER — Other Ambulatory Visit (HOSPITAL_COMMUNITY): Payer: Self-pay

## 2022-11-26 NOTE — Progress Notes (Deleted)
Cardiology Office Note:    Date:  11/26/2022   ID:  Brendan Butler, DOB 11-22-1950, MRN [id number]  PCP:  [name], MD  Cardiologist:  Charlton Haws, MD  Electrophysiologist:  None   Referring MD: [name], MD   Chief Complaint: follow-up of CAD and CHF  History of Present Illness:    Brendan Butler is a 72 y.o. male with a history of CAD s/p multiple PCIs to LAD (most recently in 2015), chronic HFrEF with EF as low as 20-25% on TEE in 04/2022 but improved to 50% in 07/2022, persistent atrial flutter s/p DCCV in 04/2022 and again in 05/2022 on Xarelto, sinus pauses, hypertension, hyperlipidemia, s/p left nephrectomy with CKD stage III, obstructive sleep apnea, pulmonary nodules, GI bleeding requiring blood transfusions secondary to recently diagnosed colon cancer s/p right hemicolectomy on 08/23/2022, and anxiety/depression who is followed by Dr. Eden Emms and presents today for routine follow-up.   Patient has a long history of CAD with multiple interventions to LAD dating back to 2001. He was admitted in 12/2012 for anterior STEMI and underwent underwent PCI with DES to LAD. He presented back to the hospital one week later for another anterior STEMI complicated by ventricular fibrillation on the cath lab table requiring defibrillation. He was found to have acute in-stent thrombosis of recently placed LAD stent. Intravascular ultrasound was then performed in order to define the mechanism of stent thrombosis. There previously implanted stent appeared well expanded throughout but there was an area of mal apposition in the distal stent. He underwent successful PCI with balloon angioplasty of the entire stented segment with 0% residual stenosis. Echo at that time showed LVEF of 40-45% with hypokinesis sof the anterior, anteroseptal, and apical segments consistent with LAD territory infarct. He was initially treated with IV Amiodarone given ventricular fibrillation but had no recurrence after  revascularization so this was ultimately stopped. He was switched from Krupp to Effient at that time. Repeat LHC in 03/2013 for recurrent chest pain showed 70% in-stent restenosis of prior LAD and underwent repeat balloon angioplasty. He was lost to follow-up in our office after 2015 except for being seen in consult in the hospital in 2017 for chest pain (felt to be atypical) and in 11/2021 for pre-op evaluation for lap chole in setting of acute cholecystitis and multiple episodes of near syncope/ syncope with bradycardia noted in the ED. Echo at that time showed normalization of EF to 55-60% with hypokinesis of the entire anterior septum and grade 1 diastolic dysfunction. He was felt to be at acceptable risk for surgery and had no evidence of high grade AV block during admission. He was again lost to follow-up after this admission and not seen again until an admission in 04/2022.   Patient has had multiple admission since 04/2022 for a variety of issues including altered mental status, rapid atrial flutter, acute CHF, and acute blood loss anemia secondary to colon cancer requiring blood transfusions. He was found to be in new onset atrial flutter and CHF with EF of 20-25% during admission in 04/2022. He underwent TEE/ DCCV with early return of atrial flutter requiring repeat TEE/ DCCV in 4/20924 after Amiodarone load. Beta-blockers were stopped due to prolonged pauses on this. Last Echo during an admission in 07/2022 for CHF and acute on chronic anemia showed normalization of LVEF to 50% with hypokinesis of the mid anteroseptal segment, mid inferoseptal segment, and basal inferoseptal segment. Cardiomyopathy was felt to likely be tachy-mediated in nature. He was also  found to have a malignant tumor at hepatic flexure during this admission in 07/2022 and underwent right hemicolectomy with ileocolonic anastomosis.   Patient was last seen by me on 09/03/2022 at which time he was doing okay from a cardiac standpoint. He  was still having some lower extremity edema but this was improved from recent hospitalization and some dyspnea on exertion which was felt to be at least partially due to continued abdominal pain from recent hemicolectomy.  Please see this office visit note for more detailed description of hospitalizations from 04/2022 to 08/2022.   Since this office visit, he has had multiple ED visits/ hospitalizations. He was admitted later in 08/2022 for nausea/ vomiting and diarrhea felt to be secondary to colitis. General Surgery recommended treating with antibiotics. He was then admitted in early 09/2022 for acute diverticulitis of distal descending colon and AKI superimposed on CKD after presenting with severe nausea and vomiting and poor PO intake. He was again treated with antibiotics. His Lasix, Entresto, and Spironolactone were held during admission but restarted at discharge. He was readmitted later that same month again for AKI on CKD after presenting with multiple falls. Creatinine was 5.65 on admission up from 1.73 at prior discharge. Renal function improved with IV fluids. His Entresto, Spironolactone, and Lasix were held at discharge. He was last seen in the ED on 10/23/2022 for lower extremity edema and pain (right > left). Lower extremity doppler was negative for DVT on the right. Creatinine had improved to 1.38. He was restarted on Lasix 40mg  daily at that time and was also prescribed Keflex for possible cellulitis.   Patient presents today for follow-up. ***  CAD Patient has a long history of single vessel CAD involving the LAD s/p multiple PCIs dating back to 2001. Last PCI was in angioplasty for in-stent restenosis of LAD in 2015.  - No chest pain.  - No antiplatelet therapy given need for full anticoagulation. - Continue statin.   Chronic HFrEF Lower Extremity Edema Initial diagnosed in 12/2012 in setting of anterior MI with EF of 35-40% at that time. EF later normalized to 55-60% on TTE in 102023.  However, TEE during recent admission in 04/2022 showed new drop in EF to 20-25%. This was felt to likely be tachy-mediated in nature. Repeat TTE in 07/2022 showed LVEF of 50% with hypokinesis of the mid anteroseptal segment, mid inferoseptal segment, and basal inferoseptal segment. Lower extremity doppler negative for DVT on the right. - *** - Continue Lasix daily. - Previously on Entresto and Spironolactone but these were stopped during an admission in 09/2022 for AKI with creatinine as high as 5.65.  - Continue Jardiance 10mg  daily. - No beta-blocker due to history of bradycardia and pauses.  - He admits to sodium discretion which suspect is contributing to lower extremity edema. Discussed importance of daily weights and sodium restrictions. Also recommend compression stockings. ***   Persistent Atrial Flutter Pauses Initially diagnosed during recent admission and underwent successful TEE/DCCV on 04/2022. He was noted to be back in atrial flutter when he was readmitted later that month for altered mental status. His beta-blocker was increased but then was noted to have increasingly longer pauses (4 second pause followed immediately by a 5.8 second pause). Therefore, beta-blocker was stopped and he was started on Amiodarone and then underwent repeat TEE/DCCV in 05/2022 with restoration of sinus rhythm and no recurrent pauses. Amiodarone decreased during recent admission in 07/2022 due to bradycardia. - Maintaining sinus rhythm on exam.  - Continue Amiodarone  100mg  daily. - Currently on anticoagulation with Xarelto on 15mg  daily. He currently meets criteria for full dose of 20mg  daily based of CrCl >50. Will recheck BMET today and if CrCl remains above 50, will increase Xarelto to 20mg  daily. ***   Hypertension BP reasonably well controlled. *** - Continue medications for CHF as above.   Hyperlipidemia Lipid panel in 07/2022: Total Cholesterol 148, Triglycerides 149, HDL 50, LDL 72. LDL goal <55 given  multiple MIs.  - Continue Crestor 20mg  daily.  - LDL not quite at goal. Did not have time to discuss this today. Can discuss increasing to 40mg  daily at next visit.   CKD Stage III Baseline creatinine around 1.3 to 1.8. He has had 2 recent admission with AKI due to nausea/ vomiting, poor PO intake, and diuretics. Creatinine as high as 5.65 in 09/2022 but improved with fluids and holding of diuretics. Stable at 1.38 on last labs on 10/23/2022. - Will repeat BMET today. ***  EKGs/Labs/Other Studies Reviewed:    The following studies were reviewed:  TTE 12/26/2021: Impressions: 1. Left ventricular ejection fraction, by estimation, is 55 to 60%. The  left ventricle has normal function. The left ventricle demonstrates  regional wall motion abnormalities (suggestive of prior anteroseptal/LAD  infarct). The left ventricular internal  cavity size was mildly dilated. Left ventricular diastolic parameters are  consistent with Grade I diastolic dysfunction (impaired relaxation).   2. Right ventricular systolic function is normal. The right ventricular  size is normal. Tricuspid regurgitation signal is inadequate for assessing  PA pressure.   3. The mitral valve is grossly normal. No evidence of mitral valve  regurgitation. No evidence of mitral stenosis.   4. The aortic valve is tricuspid. Aortic valve regurgitation is not  visualized. No aortic stenosis is present.   Comparison(s): LVEF has improve from prior reporting.  _______________   TEE 05/10/2022: Impressions: 1. Successful TEE/DCCV with return to NSR. No LAA thrombus. LVEF 20-25%.  Global HK.   2. Left ventricular ejection fraction, by estimation, is 20 to 25%. The  left ventricle has severely decreased function.   3. Right ventricular systolic function is mildly reduced. The right  ventricular size is normal.   4. Left atrial size was moderately dilated. No left atrial/left atrial  appendage thrombus was detected. The LAA emptying  velocity was 39 cm/s.   5. Right atrial size was mildly dilated.   6. The mitral valve is grossly normal. Mild mitral valve regurgitation.  No evidence of mitral stenosis.   7. The aortic valve is tricuspid. Aortic valve regurgitation is not  visualized. No aortic stenosis is present.   8. There is Moderate (Grade III) layered plaque involving the descending  aorta.    _______________   TEE 05/28/2022: Impressions: 1. No LAA thrombus DCC x 1 with 150 J biphasic converted to NSR.   2. Left ventricular ejection fraction, by estimation, is 25 to 30%. The  left ventricle has severely decreased function. The left ventricle  demonstrates global hypokinesis. The left ventricular internal cavity size  was moderately dilated.   3. Right ventricular systolic function is normal. The right ventricular  size is normal.   4. Left atrial size was moderately dilated. No left atrial/left atrial  appendage thrombus was detected.   5. The mitral valve is normal in structure. Mild mitral valve  regurgitation. No evidence of mitral stenosis.   6. The aortic valve is tricuspid. Aortic valve regurgitation is not  visualized. No aortic stenosis is present.  7. The inferior vena cava is normal in size with greater than 50%  respiratory variability, suggesting right atrial pressure of 3 mmHg.   Conclusion(s)/Recommendation(s): Normal biventricular function without  evidence of hemodynamically significant valvular heart disease.  _______________   TTE 08/17/2022: Impressions: 1. Left ventricular ejection fraction, by estimation, is 50%. The left  ventricle has low normal function. The left ventricle demonstrates  regional wall motion abnormalities (see scoring diagram/findings for  description). Left ventricular diastolic  parameters were grossly normal.   2. Right ventricular systolic function is normal. The right ventricular  size is normal. There is normal pulmonary artery systolic pressure. The   estimated right ventricular systolic pressure is 20.8 mmHg.   3. The mitral valve is grossly normal. Trivial mitral valve  regurgitation.   4. The aortic valve is grossly normal. Aortic valve regurgitation is  trivial. No aortic stenosis is present.   5. The inferior vena cava is normal in size with greater than 50%  respiratory variability, suggesting right atrial pressure of 3 mmHg.    EKG:  EKG not ordered today.    EKG:  EKG not ordered today.   Recent Labs: 08/17/2022: TSH 8.374 09/03/2022: BNP 22.6 10/09/2022: ALT 19 10/12/2022: Magnesium 1.9 10/23/2022: BUN 14; Creatinine, Ser 1.38; Hemoglobin 9.1; Platelets 194; Potassium 3.8; Sodium 141  Recent Lipid Panel    Component Value Date/Time   CHOL 139 05/24/2022 0051   TRIG 182 (H) 05/24/2022 0051   HDL 27 (L) 05/24/2022 0051   CHOLHDL 5.1 05/24/2022 0051   VLDL 36 05/24/2022 0051   LDLCALC 76 05/24/2022 0051    Physical Exam:    Vital Signs: There were no vitals taken for this visit.    Wt Readings from Last 3 Encounters:  10/12/22 195 lb 15.8 oz (88.9 kg)  09/29/22 195 lb 5.2 oz (88.6 kg)  09/10/22 212 lb 8.4 oz (96.4 kg)     General: 72 y.o. male in no acute distress. HEENT: Normocephalic and atraumatic. Sclera clear. EOMs intact. Neck: Supple. No carotid bruits. No JVD. Heart: *** RRR. Distinct S1 and S2. No murmurs, gallops, or rubs. Radial and distal pedal pulses 2+ and equal bilaterally. Lungs: No increased work of breathing. Clear to ausculation bilaterally. No wheezes, rhonchi, or rales.  Abdomen: Soft, non-distended, and non-tender to palpation. Bowel sounds present in all 4 quadrants.  MSK: Normal strength and tone for age. *** Extremities: No lower extremity edema.    Skin: Warm and dry. Neuro: Alert and oriented x3. No focal deficits. Psych: Normal affect. Responds appropriately.   Assessment:    No diagnosis found.  Plan:     Disposition: Follow up in ***   Medication Adjustments/Labs and  Tests Ordered: Current medicines are reviewed at length with the patient today.  Concerns regarding medicines are outlined above.  No orders of the defined types were placed in this encounter.  No orders of the defined types were placed in this encounter.   There are no Patient Instructions on file for this visit.   Signed, Corrin Parker, PA-C  11/26/2022 11:32 AM    [facility]

## 2022-12-07 ENCOUNTER — Ambulatory Visit: Payer: Medicare HMO | Attending: Student | Admitting: Student

## 2022-12-08 DIAGNOSIS — H524 Presbyopia: Secondary | ICD-10-CM | POA: Diagnosis not present

## 2022-12-12 NOTE — Telephone Encounter (Signed)
TC

## 2022-12-20 ENCOUNTER — Encounter (HOSPITAL_COMMUNITY): Payer: Self-pay

## 2022-12-20 ENCOUNTER — Ambulatory Visit (HOSPITAL_COMMUNITY): Payer: Medicare HMO | Admitting: Physician Assistant

## 2022-12-24 ENCOUNTER — Other Ambulatory Visit (HOSPITAL_COMMUNITY): Payer: Self-pay

## 2023-01-12 ENCOUNTER — Emergency Department (HOSPITAL_COMMUNITY): Payer: Medicare HMO

## 2023-01-12 ENCOUNTER — Inpatient Hospital Stay (HOSPITAL_COMMUNITY)
Admission: EM | Admit: 2023-01-12 | Discharge: 2023-01-15 | DRG: 872 | Disposition: A | Discharge status: Home / Self Care | Payer: Medicare HMO | Attending: Internal Medicine | Admitting: Internal Medicine

## 2023-01-12 ENCOUNTER — Encounter (HOSPITAL_COMMUNITY): Payer: Self-pay | Admitting: Family Medicine

## 2023-01-12 ENCOUNTER — Other Ambulatory Visit: Payer: Self-pay

## 2023-01-12 DIAGNOSIS — N1831 Chronic kidney disease, stage 3a: Secondary | ICD-10-CM | POA: Diagnosis present

## 2023-01-12 DIAGNOSIS — I4819 Other persistent atrial fibrillation: Secondary | ICD-10-CM | POA: Diagnosis not present

## 2023-01-12 DIAGNOSIS — G4733 Obstructive sleep apnea (adult) (pediatric): Secondary | ICD-10-CM | POA: Diagnosis present

## 2023-01-12 DIAGNOSIS — I2510 Atherosclerotic heart disease of native coronary artery without angina pectoris: Secondary | ICD-10-CM | POA: Diagnosis not present

## 2023-01-12 DIAGNOSIS — E860 Dehydration: Secondary | ICD-10-CM | POA: Diagnosis not present

## 2023-01-12 DIAGNOSIS — F418 Other specified anxiety disorders: Secondary | ICD-10-CM | POA: Diagnosis not present

## 2023-01-12 DIAGNOSIS — N179 Acute kidney failure, unspecified: Secondary | ICD-10-CM | POA: Diagnosis present

## 2023-01-12 DIAGNOSIS — E785 Hyperlipidemia, unspecified: Secondary | ICD-10-CM | POA: Diagnosis present

## 2023-01-12 DIAGNOSIS — K5730 Diverticulosis of large intestine without perforation or abscess without bleeding: Secondary | ICD-10-CM | POA: Diagnosis not present

## 2023-01-12 DIAGNOSIS — Z8673 Personal history of transient ischemic attack (TIA), and cerebral infarction without residual deficits: Secondary | ICD-10-CM

## 2023-01-12 DIAGNOSIS — I4892 Unspecified atrial flutter: Secondary | ICD-10-CM | POA: Diagnosis not present

## 2023-01-12 DIAGNOSIS — F05 Delirium due to known physiological condition: Secondary | ICD-10-CM | POA: Diagnosis not present

## 2023-01-12 DIAGNOSIS — R197 Diarrhea, unspecified: Secondary | ICD-10-CM | POA: Diagnosis present

## 2023-01-12 DIAGNOSIS — A419 Sepsis, unspecified organism: Secondary | ICD-10-CM | POA: Diagnosis not present

## 2023-01-12 DIAGNOSIS — Z7902 Long term (current) use of antithrombotics/antiplatelets: Secondary | ICD-10-CM

## 2023-01-12 DIAGNOSIS — R6520 Severe sepsis without septic shock: Secondary | ICD-10-CM | POA: Diagnosis present

## 2023-01-12 DIAGNOSIS — E876 Hypokalemia: Secondary | ICD-10-CM | POA: Diagnosis not present

## 2023-01-12 DIAGNOSIS — N183 Chronic kidney disease, stage 3 unspecified: Principal | ICD-10-CM | POA: Diagnosis present

## 2023-01-12 DIAGNOSIS — I252 Old myocardial infarction: Secondary | ICD-10-CM | POA: Diagnosis not present

## 2023-01-12 DIAGNOSIS — Z91128 Patient's intentional underdosing of medication regimen for other reason: Secondary | ICD-10-CM

## 2023-01-12 DIAGNOSIS — Z720 Tobacco use: Secondary | ICD-10-CM

## 2023-01-12 DIAGNOSIS — Z9049 Acquired absence of other specified parts of digestive tract: Secondary | ICD-10-CM

## 2023-01-12 DIAGNOSIS — R079 Chest pain, unspecified: Secondary | ICD-10-CM | POA: Diagnosis not present

## 2023-01-12 DIAGNOSIS — R112 Nausea with vomiting, unspecified: Secondary | ICD-10-CM | POA: Diagnosis not present

## 2023-01-12 DIAGNOSIS — Z7984 Long term (current) use of oral hypoglycemic drugs: Secondary | ICD-10-CM

## 2023-01-12 DIAGNOSIS — I5032 Chronic diastolic (congestive) heart failure: Secondary | ICD-10-CM | POA: Diagnosis present

## 2023-01-12 DIAGNOSIS — Z888 Allergy status to other drugs, medicaments and biological substances status: Secondary | ICD-10-CM

## 2023-01-12 DIAGNOSIS — Z886 Allergy status to analgesic agent status: Secondary | ICD-10-CM

## 2023-01-12 DIAGNOSIS — Z8249 Family history of ischemic heart disease and other diseases of the circulatory system: Secondary | ICD-10-CM

## 2023-01-12 DIAGNOSIS — F32A Depression, unspecified: Secondary | ICD-10-CM | POA: Diagnosis present

## 2023-01-12 DIAGNOSIS — F419 Anxiety disorder, unspecified: Secondary | ICD-10-CM | POA: Diagnosis present

## 2023-01-12 DIAGNOSIS — Z85038 Personal history of other malignant neoplasm of large intestine: Secondary | ICD-10-CM

## 2023-01-12 DIAGNOSIS — Z905 Acquired absence of kidney: Secondary | ICD-10-CM | POA: Diagnosis not present

## 2023-01-12 DIAGNOSIS — I130 Hypertensive heart and chronic kidney disease with heart failure and stage 1 through stage 4 chronic kidney disease, or unspecified chronic kidney disease: Secondary | ICD-10-CM | POA: Diagnosis not present

## 2023-01-12 DIAGNOSIS — E872 Acidosis, unspecified: Secondary | ICD-10-CM

## 2023-01-12 DIAGNOSIS — Z955 Presence of coronary angioplasty implant and graft: Secondary | ICD-10-CM

## 2023-01-12 DIAGNOSIS — Z7901 Long term (current) use of anticoagulants: Secondary | ICD-10-CM

## 2023-01-12 DIAGNOSIS — Z79899 Other long term (current) drug therapy: Secondary | ICD-10-CM

## 2023-01-12 DIAGNOSIS — R109 Unspecified abdominal pain: Secondary | ICD-10-CM | POA: Diagnosis not present

## 2023-01-12 DIAGNOSIS — R0789 Other chest pain: Secondary | ICD-10-CM | POA: Diagnosis not present

## 2023-01-12 LAB — I-STAT CG4 LACTIC ACID, ED
Lactic Acid, Venous: 2.4 mmol/L (ref 0.5–1.9)
Lactic Acid, Venous: 3.6 mmol/L (ref 0.5–1.9)

## 2023-01-12 LAB — BASIC METABOLIC PANEL
Anion gap: 16 — ABNORMAL HIGH (ref 5–15)
BUN: 43 mg/dL — ABNORMAL HIGH (ref 8–23)
CO2: 19 mmol/L — ABNORMAL LOW (ref 22–32)
Calcium: 8 mg/dL — ABNORMAL LOW (ref 8.9–10.3)
Chloride: 101 mmol/L (ref 98–111)
Creatinine, Ser: 3 mg/dL — ABNORMAL HIGH (ref 0.61–1.24)
GFR, Estimated: 22 mL/min — ABNORMAL LOW (ref >60)
Glucose, Bld: 114 mg/dL — ABNORMAL HIGH (ref 70–99)
Potassium: 2.8 mmol/L — ABNORMAL LOW (ref 3.5–5.1)
Sodium: 136 mmol/L (ref 135–145)

## 2023-01-12 LAB — CBC
HCT: 51.9 % (ref 39.0–52.0)
Hemoglobin: 17.2 g/dL — ABNORMAL HIGH (ref 13.0–17.0)
MCH: 29.1 pg (ref 26.0–34.0)
MCHC: 33.1 g/dL (ref 30.0–36.0)
MCV: 87.8 fL (ref 80.0–100.0)
Platelets: 282 10*3/uL (ref 150–400)
RBC: 5.91 MIL/uL — ABNORMAL HIGH (ref 4.22–5.81)
RDW: 13.2 % (ref 11.5–15.5)
WBC: 36.4 10*3/uL — ABNORMAL HIGH (ref 4.0–10.5)
nRBC: 0 % (ref 0.0–0.2)

## 2023-01-12 LAB — URINALYSIS, ROUTINE W REFLEX MICROSCOPIC
Bacteria, UA: NONE SEEN
Bilirubin Urine: NEGATIVE
Glucose, UA: >=500 mg/dL — AB
Ketones, ur: NEGATIVE mg/dL
Nitrite: NEGATIVE
Protein, ur: 30 mg/dL — AB
Specific Gravity, Urine: 1.008 (ref 1.005–1.030)
pH: 5 (ref 5.0–8.0)

## 2023-01-12 LAB — LIPASE, BLOOD: Lipase: 38 U/L (ref 11–51)

## 2023-01-12 LAB — HEPATIC FUNCTION PANEL
ALT: 20 U/L (ref 0–44)
AST: 29 U/L (ref 15–41)
Albumin: 3.4 g/dL — ABNORMAL LOW (ref 3.5–5.0)
Alkaline Phosphatase: 74 U/L (ref 38–126)
Bilirubin, Direct: 0.3 mg/dL — ABNORMAL HIGH (ref 0.0–0.2)
Indirect Bilirubin: 1.5 mg/dL — ABNORMAL HIGH (ref 0.3–0.9)
Total Bilirubin: 1.8 mg/dL — ABNORMAL HIGH (ref <1.2)
Total Protein: 6.7 g/dL (ref 6.5–8.1)

## 2023-01-12 LAB — TROPONIN I (HIGH SENSITIVITY)
Troponin I (High Sensitivity): 56 ng/L — ABNORMAL HIGH (ref <18)
Troponin I (High Sensitivity): 58 ng/L — ABNORMAL HIGH (ref <18)

## 2023-01-12 LAB — LACTIC ACID, PLASMA: Lactic Acid, Venous: 2.1 mmol/L (ref 0.5–1.9)

## 2023-01-12 LAB — MAGNESIUM: Magnesium: 2.4 mg/dL (ref 1.7–2.4)

## 2023-01-12 MED ORDER — SODIUM CHLORIDE 0.9 % IV SOLN
2.0000 g | INTRAVENOUS | Status: DC
  Administered 2023-01-13: 2 g via INTRAVENOUS
  Filled 2023-01-12: qty 12.5

## 2023-01-12 MED ORDER — POTASSIUM CHLORIDE 10 MEQ/100ML IV SOLN
10.0000 meq | INTRAVENOUS | Status: AC
  Administered 2023-01-12: 10 meq via INTRAVENOUS
  Filled 2023-01-12: qty 100

## 2023-01-12 MED ORDER — ACETAMINOPHEN 650 MG RE SUPP: 650.0000 mg | Freq: Four times a day (QID) | RECTAL | Status: DC | PRN

## 2023-01-12 MED ORDER — ROSUVASTATIN CALCIUM 5 MG PO TABS
10.0000 mg | ORAL_TABLET | Freq: Every day | ORAL | Status: DC
  Administered 2023-01-13 – 2023-01-15 (×3): 10 mg via ORAL
  Filled 2023-01-12 (×4): qty 2

## 2023-01-12 MED ORDER — METRONIDAZOLE 500 MG/100ML IV SOLN
500.0000 mg | Freq: Two times a day (BID) | INTRAVENOUS | Status: DC
  Administered 2023-01-13 (×2): 500 mg via INTRAVENOUS
  Filled 2023-01-12 (×2): qty 100

## 2023-01-12 MED ORDER — OXYCODONE HCL 5 MG PO TABS
5.0000 mg | ORAL_TABLET | ORAL | Status: DC | PRN
Start: 2023-01-12 — End: 2023-01-15

## 2023-01-12 MED ORDER — PAROXETINE HCL 30 MG PO TABS: 30.0000 mg | ORAL_TABLET | Freq: Every day | ORAL | Status: DC

## 2023-01-12 MED ORDER — LACTATED RINGERS IV BOLUS (SEPSIS)
1000.0000 mL | Freq: Once | INTRAVENOUS | Status: AC
  Administered 2023-01-12: 1000 mL via INTRAVENOUS

## 2023-01-12 MED ORDER — FENTANYL CITRATE PF 50 MCG/ML IJ SOSY: 12.5000 ug | PREFILLED_SYRINGE | INTRAMUSCULAR | Status: DC | PRN

## 2023-01-12 MED ORDER — VANCOMYCIN HCL IN DEXTROSE 1-5 GM/200ML-% IV SOLN: 1000.0000 mg | Freq: Once | INTRAVENOUS | Status: DC

## 2023-01-12 MED ORDER — METRONIDAZOLE 500 MG/100ML IV SOLN
500.0000 mg | Freq: Once | INTRAVENOUS | Status: AC
Start: 2023-01-12 — End: 2023-01-12
  Administered 2023-01-12: 500 mg via INTRAVENOUS
  Filled 2023-01-12: qty 100

## 2023-01-12 MED ORDER — BUPROPION HCL ER (XL) 150 MG PO TB24
150.0000 mg | ORAL_TABLET | Freq: Every day | ORAL | Status: DC
  Administered 2023-01-13 – 2023-01-15 (×3): 150 mg via ORAL
  Filled 2023-01-12 (×3): qty 1

## 2023-01-12 MED ORDER — CEFEPIME HCL 2 G IV SOLR
2.0000 g | Freq: Once | INTRAVENOUS | Status: AC
  Administered 2023-01-12: 2 g via INTRAVENOUS
  Filled 2023-01-12: qty 12.5

## 2023-01-12 MED ORDER — PREGABALIN 25 MG PO CAPS
75.0000 mg | ORAL_CAPSULE | Freq: Every day | ORAL | Status: DC
  Administered 2023-01-13 – 2023-01-15 (×3): 75 mg via ORAL
  Filled 2023-01-12 (×4): qty 3

## 2023-01-12 MED ORDER — LACTATED RINGERS IV BOLUS
500.0000 mL | Freq: Once | INTRAVENOUS | Status: AC
  Administered 2023-01-12: 500 mL via INTRAVENOUS

## 2023-01-12 MED ORDER — LACTATED RINGERS IV SOLN: INTRAVENOUS | Status: AC

## 2023-01-12 MED ORDER — AMIODARONE HCL 100 MG PO TABS
100.0000 mg | ORAL_TABLET | Freq: Every day | ORAL | Status: DC
  Administered 2023-01-13 – 2023-01-15 (×3): 100 mg via ORAL
  Filled 2023-01-12 (×3): qty 1

## 2023-01-12 MED ORDER — PAROXETINE HCL 10 MG PO TABS
10.0000 mg | ORAL_TABLET | Freq: Every day | ORAL | Status: DC
  Administered 2023-01-13 – 2023-01-15 (×3): 10 mg via ORAL
  Filled 2023-01-12 (×3): qty 1

## 2023-01-12 MED ORDER — SODIUM CHLORIDE 0.9% FLUSH
3.0000 mL | Freq: Two times a day (BID) | INTRAVENOUS | Status: DC
  Administered 2023-01-13 – 2023-01-15 (×6): 3 mL via INTRAVENOUS

## 2023-01-12 MED ORDER — SODIUM CHLORIDE 0.9 % IV BOLUS (SEPSIS)
1000.0000 mL | Freq: Once | INTRAVENOUS | Status: AC
  Administered 2023-01-12: 1000 mL via INTRAVENOUS

## 2023-01-12 MED ORDER — RIVAROXABAN 15 MG PO TABS
15.0000 mg | ORAL_TABLET | Freq: Every day | ORAL | Status: DC
  Administered 2023-01-13 – 2023-01-14 (×2): 15 mg via ORAL
  Filled 2023-01-12 (×2): qty 1

## 2023-01-12 MED ORDER — PROCHLORPERAZINE EDISYLATE 10 MG/2ML IJ SOLN: 5.0000 mg | Freq: Four times a day (QID) | INTRAMUSCULAR | Status: DC | PRN

## 2023-01-12 MED ORDER — ROSUVASTATIN CALCIUM 20 MG PO TABS: 20.0000 mg | ORAL_TABLET | Freq: Every day | ORAL | Status: DC

## 2023-01-12 MED ORDER — PANTOPRAZOLE SODIUM 40 MG PO TBEC
40.0000 mg | DELAYED_RELEASE_TABLET | Freq: Every day | ORAL | Status: DC
  Administered 2023-01-13 – 2023-01-15 (×3): 40 mg via ORAL
  Filled 2023-01-12 (×3): qty 1

## 2023-01-12 MED ORDER — QUETIAPINE FUMARATE 25 MG PO TABS
25.0000 mg | ORAL_TABLET | Freq: Every day | ORAL | Status: DC
  Administered 2023-01-13 – 2023-01-14 (×3): 25 mg via ORAL
  Filled 2023-01-12 (×3): qty 1

## 2023-01-12 MED ORDER — VANCOMYCIN HCL 1250 MG/250ML IV SOLN
1250.0000 mg | INTRAVENOUS | Status: DC
  Administered 2023-01-12: 1250 mg via INTRAVENOUS
  Filled 2023-01-12: qty 250

## 2023-01-12 MED ORDER — ACETAMINOPHEN 325 MG PO TABS
650.0000 mg | ORAL_TABLET | Freq: Four times a day (QID) | ORAL | Status: DC | PRN
Start: 2023-01-12 — End: 2023-01-15

## 2023-01-12 MED ORDER — PRASUGREL HCL 10 MG PO TABS
10.0000 mg | ORAL_TABLET | Freq: Every day | ORAL | Status: DC
  Administered 2023-01-13 – 2023-01-15 (×3): 10 mg via ORAL
  Filled 2023-01-12 (×3): qty 1

## 2023-01-12 MED ORDER — PREGABALIN 25 MG PO CAPS: 150.0000 mg | ORAL_CAPSULE | Freq: Two times a day (BID) | ORAL | Status: DC

## 2023-01-12 NOTE — ED Notes (Signed)
No bm since his arrival here

## 2023-01-12 NOTE — ED Notes (Signed)
ED TO INPATIENT HANDOFF REPORT  ED Nurse Name and Phone #: chris [phone]  S Name/Age/Gender Brendan Butler 72 y.o. male Room/Bed: 024C/024C  Code Status   Code Status: Full Code  Home/SNF/Other Home Patient oriented to: self, place, time, and situation Is this baseline? Yes   Triage Complete: Triage complete  Chief Complaint Acute renal failure superimposed on stage 3 chronic kidney disease, unspecified acute renal failure type, unspecified whether stage 3a or 3b CKD (HCC) [N17.9, N18.30]  Triage Note Pt here for centralized CP with nausea x 2 days. Reports vomiting last night. Hx of cardiac stents, states that he has not taken any of his medications for the past week.    Allergies Allergies  Allergen Reactions   Levitra [Vardenafil] Other (See Comments)    Blurred vision   Aspirin Hives   Lipitor [Atorvastatin] Other (See Comments)    Muscle cramps; patient states he does not take medication at home    Level of Care/Admitting Diagnosis ED Disposition     ED Disposition  Admit   Condition  --   Comment  Hospital Area: [facility] [[id number]]  Level of Care: Progressive [102]  Admit to Progressive based on following criteria: MULTISYSTEM THREATS such as stable sepsis, metabolic/electrolyte imbalance with or without encephalopathy that is responding to early treatment.  May admit patient to [facility] or [facility] if equivalent level of care is available:: Yes  Covid Evaluation: Asymptomatic - no recent exposure (last 10 days) testing not required  Diagnosis: Acute renal failure superimposed on stage 3 chronic kidney disease, unspecified acute renal failure type, unspecified whether stage 3a or 3b CKD [facility]) [[id number]]  Admitting Physician: Briscoe Deutscher [[id number]]  Attending Physician: Briscoe Deutscher [[id number]]  Certification:: I certify this patient will need inpatient services for at least 2 midnights  Expected Medical Readiness: 01/14/2023           B Medical/Surgery History Past Medical History:  Diagnosis Date   Basal ganglia infarction (HCC)    Bradycardia    CAD (coronary artery disease)    Chronic HFrEF (heart failure with reduced ejection fraction) (HCC)    Chronic kidney disease, stage 3 (HCC)    Colon cancer (HCC) 08/26/2022   Erectile dysfunction    Hyperlipidemia LDL goal <70    Hypertension    OSA (obstructive sleep apnea)    No CPAP use overnight   PAF (paroxysmal atrial fibrillation) (HCC)    Paroxysmal atrial flutter (HCC)    Premature atrial contractions    Pulmonary nodules    Past Surgical History:  Procedure Laterality Date   BACK SURGERY  2007   Decompression lumbar laminectomy and micro discectomy, L4-5   BIOPSY  08/20/2022   Procedure: BIOPSY;  Surgeon: Vida Rigger, MD;  Location: [facility] ENDOSCOPY;  Service: Gastroenterology;;   BRONCHIAL BIOPSY  12/28/2021   Procedure: BRONCHIAL BIOPSIES;  Surgeon: Leslye Peer, MD;  Location: [facility] ENDOSCOPY;  Service: Pulmonary;;   BRONCHIAL BRUSHINGS  12/28/2021   Procedure: BRONCHIAL BRUSHINGS;  Surgeon: Leslye Peer, MD;  Location: [facility] ENDOSCOPY;  Service: Pulmonary;;   BRONCHIAL NEEDLE ASPIRATION BIOPSY  12/28/2021   Procedure: BRONCHIAL NEEDLE ASPIRATION BIOPSIES;  Surgeon: Leslye Peer, MD;  Location: [facility] ENDOSCOPY;  Service: Pulmonary;;   BRONCHIAL WASHINGS  12/28/2021   Procedure: BRONCHIAL WASHINGS;  Surgeon: Leslye Peer, MD;  Location: [facility] ENDOSCOPY;  Service: Pulmonary;;   CARDIAC CATHETERIZATION  2007   patent stent to LAD and normal Cors  CARDIOVERSION N/A 05/10/2022   Procedure: CARDIOVERSION;  Surgeon: Sande Rives, MD;  Location: [facility] ENDOSCOPY;  Service: Cardiovascular;  Laterality: N/A;   CARDIOVERSION N/A 05/28/2022   Procedure: CARDIOVERSION;  Surgeon: Wendall Stade, MD;  Location: [facility] ENDOSCOPY;  Service: Cardiovascular;  Laterality: N/A;   CHOLECYSTECTOMY N/A 12/26/2021   Procedure: LAPAROSCOPIC CHOLECYSTECTOMY, LAPAROSCOPIC  LYSIS OF ADHESIONS GREATER THAN 1 HOUR;  Surgeon: [name], MD;  Location: [facility] OR;  Service: General;  Laterality: N/A;   COLONOSCOPY WITH PROPOFOL N/A 08/20/2022   Procedure: COLONOSCOPY WITH PROPOFOL;  Surgeon: Vida Rigger, MD;  Location: [facility] ENDOSCOPY;  Service: Gastroenterology;  Laterality: N/A;   CORONARY ANGIOPLASTY WITH STENT PLACEMENT  2001   to LAD   CORONARY ANGIOPLASTY WITH STENT PLACEMENT  12/29/12   STEMI with promus DES to LAD   CORONARY ANGIOPLASTY WITH STENT PLACEMENT  01/05/13   STEMI with thrombosis in stent to LAD   ESOPHAGOGASTRODUODENOSCOPY (EGD) WITH PROPOFOL N/A 08/20/2022   Procedure: ESOPHAGOGASTRODUODENOSCOPY (EGD) WITH PROPOFOL;  Surgeon: Vida Rigger, MD;  Location: [facility] ENDOSCOPY;  Service: Gastroenterology;  Laterality: N/A;   FIDUCIAL MARKER PLACEMENT  12/28/2021   Procedure: FIDUCIAL MARKER PLACEMENT;  Surgeon: Leslye Peer, MD;  Location: [facility] ENDOSCOPY;  Service: Pulmonary;;   LAPAROSCOPIC PARTIAL COLECTOMY Right 08/23/2022   Procedure: LAPAROSCOPIC CONVERTED TO OPEN HEMICOLECTOMY;  Surgeon: Axel Filler, MD;  Location: [facility] OR;  Service: General;  Laterality: Right;   LEFT HEART CATH N/A 12/29/2012   Procedure: LEFT HEART CATH;  Surgeon: Corky Crafts, MD;  Location: [facility] CATH LAB;  Service: Cardiovascular;  Laterality: N/A;   LEFT HEART CATHETERIZATION WITH CORONARY ANGIOGRAM N/A 01/05/2013   Procedure: LEFT HEART CATHETERIZATION WITH CORONARY ANGIOGRAM;  Surgeon: Micheline Chapman, MD;  Location: [facility] CATH LAB;  Service: Cardiovascular;  Laterality: N/A;   LEFT HEART CATHETERIZATION WITH CORONARY ANGIOGRAM N/A 04/22/2013   Procedure: LEFT HEART CATHETERIZATION WITH CORONARY ANGIOGRAM;  Surgeon: Lennette Bihari, MD;  Location: [facility] CATH LAB;  Service: Cardiovascular;  Laterality: N/A;   PARTIAL NEPHRECTOMY Left 1975   PATIENT ONLY HAS ONE KIDNEY   PERCUTANEOUS CORONARY STENT INTERVENTION (PCI-S)  12/29/2012   Procedure: PERCUTANEOUS CORONARY STENT INTERVENTION  (PCI-S);  Surgeon: Corky Crafts, MD;  Location: [facility] CATH LAB;  Service: Cardiovascular;;   SUBMUCOSAL TATTOO INJECTION  08/20/2022   Procedure: SUBMUCOSAL TATTOO INJECTION;  Surgeon: Vida Rigger, MD;  Location: [facility] ENDOSCOPY;  Service: Gastroenterology;;   TEE WITHOUT CARDIOVERSION N/A 05/10/2022   Procedure: TRANSESOPHAGEAL ECHOCARDIOGRAM (TEE);  Surgeon: Sande Rives, MD;  Location: [facility] ENDOSCOPY;  Service: Cardiovascular;  Laterality: N/A;   TEE WITHOUT CARDIOVERSION N/A 05/28/2022   Procedure: TRANSESOPHAGEAL ECHOCARDIOGRAM (TEE);  Surgeon: Wendall Stade, MD;  Location: [facility] ENDOSCOPY;  Service: Cardiovascular;  Laterality: N/A;     A IV Location/Drains/Wounds Patient Lines/Drains/Airways Status     Active Line/Drains/Airways     Name Placement date Placement time Site Days   Peripheral IV 01/12/23 20 G Right Antecubital 01/12/23  1922  Antecubital  less than 1            Intake/Output Last 24 hours  Intake/Output Summary (Last 24 hours) at 01/12/2023 2253 Last data filed at 01/12/2023 2117 Gross per 24 hour  Intake 1500 ml  Output --  Net 1500 ml    Labs/Imaging Results for orders placed or performed during the hospital encounter of 01/12/23 (from the past 48 hour(s))  Basic metabolic panel     Status: Abnormal   Collection Time: 01/12/23  5:03 PM  Result Value Ref Range   Sodium 136 135 - 145 mmol/L   Potassium 2.8 (L) 3.5 - 5.1 mmol/L   Chloride 101 98 - 111 mmol/L   CO2 19 (L) 22 - 32 mmol/L   Glucose, Bld 114 (H) 70 - 99 mg/dL    Comment: Glucose reference range applies only to samples taken after fasting for at least 8 hours.   BUN 43 (H) 8 - 23 mg/dL   Creatinine, Ser 1.61 (H) 0.61 - 1.24 mg/dL   Calcium 8.0 (L) 8.9 - 10.3 mg/dL   GFR, Estimated 22 (L) >60 mL/min    Comment: (NOTE) Calculated using the CKD-EPI Creatinine Equation (2021)    Anion gap 16 (H) 5 - 15    Comment: Performed at [facility] Lab, [address], [place], [place]  [address]  CBC     Status: Abnormal   Collection Time: 01/12/23  5:03 PM  Result Value Ref Range   WBC 36.4 (H) 4.0 - 10.5 K/uL   RBC 5.91 (H) 4.22 - 5.81 MIL/uL   Hemoglobin 17.2 (H) 13.0 - 17.0 g/dL   HCT 54.0 98.1 - 19.1 %   MCV 87.8 80.0 - 100.0 fL   MCH 29.1 26.0 - 34.0 pg   MCHC 33.1 30.0 - 36.0 g/dL   RDW 47.8 29.5 - 62.1 %   Platelets 282 150 - 400 K/uL   nRBC 0.0 0.0 - 0.2 %    Comment: Performed at [facility] Lab, [address] [place], [place] [address]  Troponin I (High Sensitivity)     Status: Abnormal   Collection Time: 01/12/23  5:03 PM  Result Value Ref Range   Troponin I (High Sensitivity) 56 (H) <18 ng/L    Comment: (NOTE) Elevated high sensitivity troponin I (hsTnI) values and significant  changes across serial measurements may suggest ACS but many other  chronic and acute conditions are known to elevate hsTnI results.  Refer to the "Links" section for chest pain algorithms and additional  guidance. Performed at [facility] Lab, [address], [place], [place] [address]   Lipase, blood     Status: None   Collection Time: 01/12/23  6:59 PM  Result Value Ref Range   Lipase 38 11 - 51 U/L    Comment: Performed at [facility] Lab, [address] [place], [place] [address]  Hepatic function panel     Status: Abnormal   Collection Time: 01/12/23  6:59 PM  Result Value Ref Range   Total Protein 6.7 6.5 - 8.1 g/dL   Albumin 3.4 (L) 3.5 - 5.0 g/dL   AST 29 15 - 41 U/L   ALT 20 0 - 44 U/L   Alkaline Phosphatase 74 38 - 126 U/L   Total Bilirubin 1.8 (H) <1.2 mg/dL   Bilirubin, Direct 0.3 (H) 0.0 - 0.2 mg/dL   Indirect Bilirubin 1.5 (H) 0.3 - 0.9 mg/dL    Comment: Performed at [facility] Lab, [address], [place], [place] [address]  Troponin I (High Sensitivity)     Status: Abnormal   Collection Time: 01/12/23  6:59 PM  Result Value Ref Range   Troponin I (High Sensitivity) 58 (H) <18 ng/L    Comment: (NOTE) Elevated high sensitivity troponin  I (hsTnI) values and significant  changes across serial measurements may suggest ACS but many other  chronic and acute conditions are known to elevate hsTnI results.  Refer to the "Links" section  for chest pain algorithms and additional  guidance. Performed at [facility] Lab, [address], [place], [place] [address]   Magnesium     Status: None   Collection Time: 01/12/23  6:59 PM  Result Value Ref Range   Magnesium 2.4 1.7 - 2.4 mg/dL    Comment: Performed at [facility] Lab, [address], [place], [place] [address]  I-Stat CG4 Lactic Acid     Status: Abnormal   Collection Time: 01/12/23  7:06 PM  Result Value Ref Range   Lactic Acid, Venous 2.4 (HH) 0.5 - 1.9 mmol/L   Comment NOTIFIED PHYSICIAN   I-Stat CG4 Lactic Acid     Status: Abnormal   Collection Time: 01/12/23  7:53 PM  Result Value Ref Range   Lactic Acid, Venous 3.6 (HH) 0.5 - 1.9 mmol/L   Comment NOTIFIED PHYSICIAN   Urinalysis, Routine w reflex microscopic -Urine, Clean Catch     Status: Abnormal   Collection Time: 01/12/23  9:38 PM  Result Value Ref Range   Color, Urine YELLOW YELLOW   APPearance CLEAR CLEAR   Specific Gravity, Urine 1.008 1.005 - 1.030   pH 5.0 5.0 - 8.0   Glucose, UA >=500 (A) NEGATIVE mg/dL   Hgb urine dipstick SMALL (A) NEGATIVE   Bilirubin Urine NEGATIVE NEGATIVE   Ketones, ur NEGATIVE NEGATIVE mg/dL   Protein, ur 30 (A) NEGATIVE mg/dL   Nitrite NEGATIVE NEGATIVE   Leukocytes,Ua TRACE (A) NEGATIVE   RBC / HPF 0-5 0 - 5 RBC/hpf   WBC, UA 0-5 0 - 5 WBC/hpf   Bacteria, UA NONE SEEN NONE SEEN   Squamous Epithelial / HPF 0-5 0 - 5 /HPF   Mucus PRESENT    Hyaline Casts, UA PRESENT     Comment: Performed at [facility] Lab, [address] [place], [place] [address]  Lactic acid, plasma     Status: Abnormal   Collection Time: 01/12/23  9:54 PM  Result Value Ref Range   Lactic Acid, Venous 2.1 (HH) 0.5 - 1.9 mmol/L    Comment: CRITICAL RESULT CALLED TO, READ BACK BY AND  VERIFIED WITH Salena Saner Khalani Novoa, RN AT 2231 11.16.24 JLASIGAN Performed at [facility] Lab, [address] [place], [place] [address]    CT ABDOMEN PELVIS WO CONTRAST  Result Date: 01/12/2023 CLINICAL DATA:  Abdominal pain, chest pain.  Nausea, vomiting EXAM: CT ABDOMEN AND PELVIS WITHOUT CONTRAST TECHNIQUE: Multidetector CT imaging of the abdomen and pelvis was performed following the standard protocol without IV contrast. RADIATION DOSE REDUCTION: This exam was performed according to the departmental dose-optimization program which includes automated exposure control, adjustment of the mA and/or kV according to patient size and/or use of iterative reconstruction technique. COMPARISON:  10/09/2022 FINDINGS: Lower chest: Scarring in the right lower lobe posteriorly. No effusions. Hepatobiliary: No focal liver abnormality is seen. Status post cholecystectomy. No biliary dilatation. Pancreas: No focal abnormality or ductal dilatation. Spleen: No focal abnormality.  Normal size. Adrenals/Urinary Tract: Prior left nephrectomy. No renal or adrenal mass. No stones or hydronephrosis on the right. Urinary bladder unremarkable. Stomach/Bowel: Postoperative changes in the right colon. Scattered colonic diverticula. No active diverticulitis. Stomach and small bowel decompressed, unremarkable. Vascular/Lymphatic: Aortic atherosclerosis. No evidence of aneurysm or adenopathy. Reproductive: No visible focal abnormality. Other: No free fluid or free air. Musculoskeletal: No acute bony abnormality. IMPRESSION: No acute findings in the abdomen or pelvis. Aortic atherosclerosis. Scattered colonic diverticulosis.  No active diverticulitis. Electronically Signed   By:  Charlett Nose M.D.   On: 01/12/2023 20:34   DG Chest 2 View  Result Date: 01/12/2023 CLINICAL DATA:  Chest pain, nausea, and vomiting for 2 days. Coronary artery disease. EXAM: CHEST - 2 VIEW COMPARISON:  10/23/2022 FINDINGS: The heart size and mediastinal  contours are within normal limits. Both lungs are clear. IMPRESSION: No active cardiopulmonary disease. Electronically Signed   By: Danae Orleans M.D.   On: 01/12/2023 17:54    Pending Labs Unresulted Labs (From admission, onward)     Start     Ordered   01/13/23 0500  Hepatic function panel  Tomorrow morning,   R        01/12/23 2154   01/13/23 0500  Basic metabolic panel  Daily,   R      01/12/23 2154   01/13/23 0500  CBC  Daily,   R      01/12/23 2154   01/12/23 2218  Gastrointestinal Panel by PCR , Stool  (Gastrointestinal Panel by PCR, Stool                                                                                                                                                     **Does Not include CLOSTRIDIUM DIFFICILE testing. **If CDIFF testing is needed, place order from the "C Difficile Testing" order set.**)  Once,   R        01/12/23 2217   01/12/23 2217  C Difficile Quick Screen w PCR reflex  (C Difficile quick screen w PCR reflex panel )  Once, for 24 hours,   TIMED       References:    CDiff Information Tool   01/12/23 2217   01/12/23 2117  Blood culture (routine x 2)  BLOOD CULTURE X 2,   R (with STAT occurrences)      01/12/23 2116   01/12/23 2007  Culture, blood (single)  (Undifferentiated -> Now sepsis confirmed (treatment and sepsis specific nursing orders))  ONCE - STAT,   STAT        01/12/23 2006            Vitals/Pain Today's Vitals   01/12/23 2000 01/12/23 2012 01/12/23 2222 01/12/23 2222  BP: (!) 118/95     Pulse: 93     Resp: 18     Temp:  98.2 F (36.8 C) 97.8 F (36.6 C) 97.8 F (36.6 C)  TempSrc:      SpO2: (!) 89%     Weight:      Height:      PainSc:        Isolation Precautions Enteric precautions (UV disinfection)  Medications Medications  potassium chloride 10 mEq in 100 mL IVPB (0 mEq Intravenous Stopped 01/12/23 2034)  lactated ringers infusion ( Intravenous New Bag/Given 01/12/23 2117)  amiodarone (PACERONE) tablet 100 mg  (  has no administration in time range)  buPROPion (WELLBUTRIN XL) 24 hr tablet 150 mg (has no administration in time range)  QUEtiapine (SEROQUEL) tablet 25 mg (has no administration in time range)  pantoprazole (PROTONIX) EC tablet 40 mg (has no administration in time range)  prasugrel (EFFIENT) tablet 10 mg (has no administration in time range)  Rivaroxaban (XARELTO) tablet 15 mg (has no administration in time range)  sodium chloride flush (NS) 0.9 % injection 3 mL (has no administration in time range)  acetaminophen (TYLENOL) tablet 650 mg (has no administration in time range)    Or  acetaminophen (TYLENOL) suppository 650 mg (has no administration in time range)  oxyCODONE (Oxy IR/ROXICODONE) immediate release tablet 5 mg (has no administration in time range)  fentaNYL (SUBLIMAZE) injection 12.5-50 mcg (has no administration in time range)  prochlorperazine (COMPAZINE) injection 5 mg (has no administration in time range)  sodium chloride 0.9 % bolus 1,000 mL (has no administration in time range)  metroNIDAZOLE (FLAGYL) IVPB 500 mg (has no administration in time range)  PARoxetine (PAXIL) tablet 10 mg (has no administration in time range)  pregabalin (LYRICA) capsule 75 mg (has no administration in time range)  rosuvastatin (CRESTOR) tablet 10 mg (has no administration in time range)  ceFEPIme (MAXIPIME) 2 g in sodium chloride 0.9 % 100 mL IVPB (has no administration in time range)  vancomycin (VANCOREADY) IVPB 1250 mg/250 mL (has no administration in time range)  lactated ringers bolus 500 mL (0 mLs Intravenous Stopped 01/12/23 2057)  lactated ringers bolus 1,000 mL (0 mLs Intravenous Stopped 01/12/23 2215)  ceFEPIme (MAXIPIME) 2 g in sodium chloride 0.9 % 100 mL IVPB (0 g Intravenous Stopped 01/12/23 2109)  metroNIDAZOLE (FLAGYL) IVPB 500 mg (0 mg Intravenous Stopped 01/12/23 2215)    Mobility walks     Focused Assessments Cardiac Assessment Handoff:    Lab Results  Component  Value Date   CKTOTAL 69 10/12/2022   CKMB 1.4 10/12/2022   TROPONINI <0.03 01/28/2016   Lab Results  Component Value Date   DDIMER 0.30 08/17/2022   Does the Patient currently have chest pain? No    R Recommendations: See Admitting Provider Note  Report given to:   Additional Notes:

## 2023-01-12 NOTE — ED Notes (Signed)
Half the flaggyl is only half infused

## 2023-01-12 NOTE — ED Notes (Signed)
All fluids and antibiotic stopped iv not running

## 2023-01-12 NOTE — ED Notes (Signed)
The pts tongue has been burning for the past15 minutes the cefeipime may have caused it  it has infused ed pharmacist notified

## 2023-01-12 NOTE — ED Triage Notes (Signed)
Pt here for centralized CP with nausea x 2 days. Reports vomiting last night. Hx of cardiac stents, states that he has not taken any of his medications for the past week.

## 2023-01-12 NOTE — Progress Notes (Signed)
Pharmacy Antibiotic Note  Brendan Butler is a 72 y.o. male admitted on 01/12/2023 with sepsis.  Pharmacy has been consulted for vancomycin and cefepime dosing. Pt has CKD, Cr on admit 3mg /dl.  Plan: Cefepime 2g IV q24h Vancomycin 1250mg  IV q48h - est AUC 519 Follow Cr Consider vancomycin random prior to redosing pending Cr trend  Height: 5\' 10"  (177.8 cm) Weight: 99.8 kg (220 lb) IBW/kg (Calculated) : 73  Temp (24hrs), Avg:97.7 F (36.5 C), Min:97.5 F (36.4 C), Max:98.2 F (36.8 C)  Recent Labs  Lab 01/12/23 1703 01/12/23 1906 01/12/23 1953  WBC 36.4*  --   --   CREATININE 3.00*  --   --   LATICACIDVEN  --  2.4* 3.6*    Estimated Creatinine Clearance: 27.5 mL/min (A) (by C-G formula based on SCr of 3 mg/dL (H)).    Allergies  Allergen Reactions   Levitra [Vardenafil] Other (See Comments)    Blurred vision   Aspirin Hives   Lipitor [Atorvastatin] Other (See Comments)    Muscle cramps; patient states he does not take medication at home    Fredonia Highland, PharmD, BCPS, [facility] Clinical Pharmacist [phone] Please check AMION for all [facility] Pharmacy numbers 01/12/2023

## 2023-01-12 NOTE — ED Notes (Signed)
Iv fluid paused  so the pt could get his c-t scan

## 2023-01-12 NOTE — ED Notes (Signed)
The pts iv has infiltrated  iv team consult  sent

## 2023-01-12 NOTE — Sepsis Progress Note (Signed)
Elink following for sepsis protocol. 

## 2023-01-12 NOTE — H&P (Signed)
History and Physical    Brendan Butler JXB:[id number] DOB: July 31, 1950 DOA: 01/12/2023  PCP: [name], MD   Patient coming from: Home   Chief Complaint: Abdominal pain, N/V/D   HPI: Brendan Butler is a 72 y.o. male with medical history significant for atrial fibrillation on Xarelto, CAD, chronic HFpEF, depression, anxiety, colon cancer status post hemicolectomy in June 2024, and CKD 3A who presents with epigastric pain, nausea, vomiting, and loose stools.  Patient reports developing abdominal pain, nausea, vomiting, and loose stool on 01/06/2023.  He has not had an appetite over this interval. Symptoms have been persistent.  Pain is localized to the mid and upper abdomen.  There is no blood in his vomit or stool.  He reports at least 2 stools daily since time of symptom onset.  He was having chills and sweats last night.  He denies chest pain, cough, shortness of breath, or dysuria.  ED Course: Upon arrival to the ED, patient is found to be afebrile with normal heart rate and stable blood pressure.  EKG demonstrates atrial fibrillation, chest x-ray is negative for acute cardiopulmonary disease, and no acute findings are noted on CT of the abdomen and pelvis.  Labs are most notable for creatinine 3.00, WBC 36,400, lactic acid 3.6, and troponin 56.  Blood culture was ordered and the patient was given 1.5 L of LR, cefepime, Flagyl, and 40 mEq IV potassium in the ED.  Review of Systems:  All other systems reviewed and apart from HPI, are negative.  Past Medical History:  Diagnosis Date   Basal ganglia infarction (HCC)    Bradycardia    CAD (coronary artery disease)    Chronic HFrEF (heart failure with reduced ejection fraction) (HCC)    Chronic kidney disease, stage 3 (HCC)    Colon cancer (HCC) 08/26/2022   Erectile dysfunction    Hyperlipidemia LDL goal <70    Hypertension    OSA (obstructive sleep apnea)    No CPAP use overnight   PAF (paroxysmal atrial fibrillation) (HCC)     Paroxysmal atrial flutter (HCC)    Premature atrial contractions    Pulmonary nodules     Past Surgical History:  Procedure Laterality Date   BACK SURGERY  2007   Decompression lumbar laminectomy and micro discectomy, L4-5   BIOPSY  08/20/2022   Procedure: BIOPSY;  Surgeon: Vida Rigger, MD;  Location: [facility] ENDOSCOPY;  Service: Gastroenterology;;   BRONCHIAL BIOPSY  12/28/2021   Procedure: BRONCHIAL BIOPSIES;  Surgeon: Leslye Peer, MD;  Location: [facility] ENDOSCOPY;  Service: Pulmonary;;   BRONCHIAL BRUSHINGS  12/28/2021   Procedure: BRONCHIAL BRUSHINGS;  Surgeon: Leslye Peer, MD;  Location: [facility] ENDOSCOPY;  Service: Pulmonary;;   BRONCHIAL NEEDLE ASPIRATION BIOPSY  12/28/2021   Procedure: BRONCHIAL NEEDLE ASPIRATION BIOPSIES;  Surgeon: Leslye Peer, MD;  Location: [facility] ENDOSCOPY;  Service: Pulmonary;;   BRONCHIAL WASHINGS  12/28/2021   Procedure: BRONCHIAL WASHINGS;  Surgeon: Leslye Peer, MD;  Location: [facility] ENDOSCOPY;  Service: Pulmonary;;   CARDIAC CATHETERIZATION  2007   patent stent to LAD and normal Cors   CARDIOVERSION N/A 05/10/2022   Procedure: CARDIOVERSION;  Surgeon: Sande Rives, MD;  Location: [facility] ENDOSCOPY;  Service: Cardiovascular;  Laterality: N/A;   CARDIOVERSION N/A 05/28/2022   Procedure: CARDIOVERSION;  Surgeon: Wendall Stade, MD;  Location: [facility] ENDOSCOPY;  Service: Cardiovascular;  Laterality: N/A;   CHOLECYSTECTOMY N/A 12/26/2021   Procedure: LAPAROSCOPIC CHOLECYSTECTOMY, LAPAROSCOPIC LYSIS OF ADHESIONS GREATER THAN 1 HOUR;  Surgeon:  [name], MD;  Location: [facility] OR;  Service: General;  Laterality: N/A;   COLONOSCOPY WITH PROPOFOL N/A 08/20/2022   Procedure: COLONOSCOPY WITH PROPOFOL;  Surgeon: Vida Rigger, MD;  Location: [facility] ENDOSCOPY;  Service: Gastroenterology;  Laterality: N/A;   CORONARY ANGIOPLASTY WITH STENT PLACEMENT  2001   to LAD   CORONARY ANGIOPLASTY WITH STENT PLACEMENT  12/29/12   STEMI with promus DES to LAD   CORONARY ANGIOPLASTY WITH  STENT PLACEMENT  01/05/13   STEMI with thrombosis in stent to LAD   ESOPHAGOGASTRODUODENOSCOPY (EGD) WITH PROPOFOL N/A 08/20/2022   Procedure: ESOPHAGOGASTRODUODENOSCOPY (EGD) WITH PROPOFOL;  Surgeon: Vida Rigger, MD;  Location: [facility] ENDOSCOPY;  Service: Gastroenterology;  Laterality: N/A;   FIDUCIAL MARKER PLACEMENT  12/28/2021   Procedure: FIDUCIAL MARKER PLACEMENT;  Surgeon: Leslye Peer, MD;  Location: [facility] ENDOSCOPY;  Service: Pulmonary;;   LAPAROSCOPIC PARTIAL COLECTOMY Right 08/23/2022   Procedure: LAPAROSCOPIC CONVERTED TO OPEN HEMICOLECTOMY;  Surgeon: Axel Filler, MD;  Location: [facility] OR;  Service: General;  Laterality: Right;   LEFT HEART CATH N/A 12/29/2012   Procedure: LEFT HEART CATH;  Surgeon: Corky Crafts, MD;  Location: [facility] CATH LAB;  Service: Cardiovascular;  Laterality: N/A;   LEFT HEART CATHETERIZATION WITH CORONARY ANGIOGRAM N/A 01/05/2013   Procedure: LEFT HEART CATHETERIZATION WITH CORONARY ANGIOGRAM;  Surgeon: Micheline Chapman, MD;  Location: [facility] CATH LAB;  Service: Cardiovascular;  Laterality: N/A;   LEFT HEART CATHETERIZATION WITH CORONARY ANGIOGRAM N/A 04/22/2013   Procedure: LEFT HEART CATHETERIZATION WITH CORONARY ANGIOGRAM;  Surgeon: Lennette Bihari, MD;  Location: [facility] CATH LAB;  Service: Cardiovascular;  Laterality: N/A;   PARTIAL NEPHRECTOMY Left 1975   PATIENT ONLY HAS ONE KIDNEY   PERCUTANEOUS CORONARY STENT INTERVENTION (PCI-S)  12/29/2012   Procedure: PERCUTANEOUS CORONARY STENT INTERVENTION (PCI-S);  Surgeon: Corky Crafts, MD;  Location: [facility] CATH LAB;  Service: Cardiovascular;;   SUBMUCOSAL TATTOO INJECTION  08/20/2022   Procedure: SUBMUCOSAL TATTOO INJECTION;  Surgeon: Vida Rigger, MD;  Location: [facility] ENDOSCOPY;  Service: Gastroenterology;;   TEE WITHOUT CARDIOVERSION N/A 05/10/2022   Procedure: TRANSESOPHAGEAL ECHOCARDIOGRAM (TEE);  Surgeon: Sande Rives, MD;  Location: [facility] ENDOSCOPY;  Service: Cardiovascular;  Laterality: N/A;   TEE WITHOUT  CARDIOVERSION N/A 05/28/2022   Procedure: TRANSESOPHAGEAL ECHOCARDIOGRAM (TEE);  Surgeon: Wendall Stade, MD;  Location: [facility] ENDOSCOPY;  Service: Cardiovascular;  Laterality: N/A;    Social History:   reports that he quit smoking about 23 years ago. His smoking use included cigarettes. He started smoking about 48 years ago. He has a 50 pack-year smoking history. His smokeless tobacco use includes chew. He reports that he does not currently use alcohol. He reports current drug use. Drug: Marijuana.  Allergies  Allergen Reactions   Levitra [Vardenafil] Other (See Comments)    Blurred vision   Aspirin Hives   Lipitor [Atorvastatin] Other (See Comments)    Muscle cramps; patient states he does not take medication at home    Family History  Problem Relation Age of Onset   Alzheimer's disease Mother    CAD Father      Prior to Admission medications   Medication Sig Start Date End Date Taking? Authorizing Provider  nitroGLYCERIN (NITROSTAT) 0.4 MG SL tablet Place 0.4 mg under the tongue every 5 (five) minutes as needed for chest pain. 01/08/23  Yes [provider]  acetaminophen (TYLENOL) 500 MG tablet Take 2 tablets (1,000 mg total) by mouth every 6 (six) hours. Patient taking differently: Take 1,000 mg by mouth  daily as needed for moderate pain. 08/28/22   Swayze, Ava, DO  amiodarone (PACERONE) 200 MG tablet Take 1/2 tablet (100 mg total) by mouth daily. 08/29/22   Swayze, Ava, DO  buPROPion (WELLBUTRIN XL) 150 MG 24 hr tablet Take 1 tablet (150 mg total) by mouth daily. 05/24/22   Elgergawy, Leana Roe, MD  cephALEXin (KEFLEX) 500 MG capsule Take 1 capsule (500 mg total) by mouth 4 (four) times daily. 10/23/22   Durwin Glaze, MD  cyanocobalamin (VITAMIN B12) 1000 MCG/ML injection Inject into the muscle. 09/01/22   [provider]  empagliflozin (JARDIANCE) 10 MG TABS tablet Take 1 tablet (10 mg total) by mouth daily. 06/18/22   Marjie Skiff E, PA-C  ferrous sulfate 325 (65 FE)  MG EC tablet Take 1 tablet (325 mg total) by mouth 2 (two) times daily. 07/31/22 09/27/22  Jerald Kief, MD  fluticasone (FLONASE) 50 MCG/ACT nasal spray Place 1 spray into both nostrils daily as needed for allergies. 12/24/21   [provider]  furosemide (LASIX) 40 MG tablet Take 1 tablet (40 mg total) by mouth daily. 10/23/22   Durwin Glaze, MD  lactose free nutrition (BOOST PLUS) LIQD Take 237 mLs by mouth 2 (two) times daily between meals. 08/29/22   Swayze, Ava, DO  methocarbamol (ROBAXIN) 500 MG tablet Take 1 tablet (500 mg total) by mouth every 8 (eight) hours as needed for muscle spasms. 08/28/22   Swayze, Ava, DO  Multiple Vitamin (MULTIVITAMIN WITH MINERALS) TABS tablet Take 1 tablet by mouth daily. 08/29/22   Swayze, Ava, DO  ondansetron (ZOFRAN) 4 MG tablet Take 1 tablet (4 mg total) by mouth every 8 (eight) hours as needed for nausea or vomiting. 09/13/22 09/13/23  Rai, Ripudeep K, MD  oxyCODONE (OXY IR/ROXICODONE) 5 MG immediate release tablet Take 1 tablet (5 mg total) by mouth every 6 (six) hours as needed for moderate pain. 08/28/22   Swayze, Ava, DO  pantoprazole (PROTONIX) 40 MG tablet Take 1 tablet (40 mg total) by mouth daily. 05/11/22   Pokhrel, Rebekah Chesterfield, MD  PARoxetine (PAXIL) 30 MG tablet Take 30 mg by mouth daily.    [provider]  prasugrel (EFFIENT) 10 MG TABS tablet Take 10 mg by mouth daily. 09/12/22   [provider]  pregabalin (LYRICA) 150 MG capsule Take 1 capsule (150 mg total) by mouth 2 (two) times daily. 05/24/22   Elgergawy, Leana Roe, MD  QUEtiapine (SEROQUEL) 25 MG tablet Take 1 tablet (25 mg total) by mouth at bedtime. 05/10/22   Pokhrel, Rebekah Chesterfield, MD  Rivaroxaban (XARELTO) 15 MG TABS tablet Take 1 tablet (15 mg total) by mouth daily with supper. 08/28/22   Swayze, Ava, DO  rosuvastatin (CRESTOR) 20 MG tablet Take 1 tablet (20 mg total) by mouth daily. 06/18/22   Corrin Parker, PA-C    Physical Exam: Vitals:   01/12/23 1648 01/12/23 1800  01/12/23 2000 01/12/23 2012  BP:  128/82 (!) 118/95   Pulse:  96 93   Resp:  16 18   Temp:  (!) 97.5 F (36.4 C)  98.2 F (36.8 C)  TempSrc:  Oral    SpO2:   (!) 89%   Weight: 99.8 kg     Height: 5\' 10"  (1.778 m)       Constitutional: NAD, calm  Eyes: PERTLA, lids and conjunctivae normal ENMT: Mucous membranes are moist. Posterior pharynx clear of any exudate or lesions.   Neck: supple, no masses  Respiratory: no wheezing, no  crackles. No accessory muscle use.  Cardiovascular: S1 & S2 heard, regular rate and rhythm. No extremity edema.   Abdomen: Soft, tender in mid- and upper abdomen. Bowel sounds active.  Musculoskeletal: no clubbing / cyanosis. No joint deformity upper and lower extremities.   Skin: no significant rashes, lesions, ulcers. Warm, dry, well-perfused. Neurologic: CN 2-12 grossly intact. Moving all extremities. Alert and oriented.  Psychiatric: Pleasant. Cooperative.    Labs and Imaging on Admission: I have personally reviewed following labs and imaging studies  CBC: Recent Labs  Lab 01/12/23 1703  WBC 36.4*  HGB 17.2*  HCT 51.9  MCV 87.8  PLT 282   Basic Metabolic Panel: Recent Labs  Lab 01/12/23 1703 01/12/23 1859  NA 136  --   K 2.8*  --   CL 101  --   CO2 19*  --   GLUCOSE 114*  --   BUN 43*  --   CREATININE 3.00*  --   CALCIUM 8.0*  --   MG  --  2.4   GFR: Estimated Creatinine Clearance: 27.5 mL/min (A) (by C-G formula based on SCr of 3 mg/dL (H)). Liver Function Tests: Recent Labs  Lab 01/12/23 1859  AST 29  ALT 20  ALKPHOS 74  BILITOT 1.8*  PROT 6.7  ALBUMIN 3.4*   Recent Labs  Lab 01/12/23 1859  LIPASE 38   No results for input(s): "AMMONIA" in the last 168 hours. Coagulation Profile: No results for input(s): "INR", "PROTIME" in the last 168 hours. Cardiac Enzymes: No results for input(s): "CKTOTAL", "CKMB", "CKMBINDEX", "TROPONINI" in the last 168 hours. BNP (last 3 results) No results for input(s): "PROBNP" in the  last 8760 hours. HbA1C: No results for input(s): "HGBA1C" in the last 72 hours. CBG: No results for input(s): "GLUCAP" in the last 168 hours. Lipid Profile: No results for input(s): "CHOL", "HDL", "LDLCALC", "TRIG", "CHOLHDL", "LDLDIRECT" in the last 72 hours. Thyroid Function Tests: No results for input(s): "TSH", "T4TOTAL", "FREET4", "T3FREE", "THYROIDAB" in the last 72 hours. Anemia Panel: No results for input(s): "VITAMINB12", "FOLATE", "FERRITIN", "TIBC", "IRON", "RETICCTPCT" in the last 72 hours. Urine analysis:    Component Value Date/Time   COLORURINE YELLOW 10/09/2022 1530   APPEARANCEUR CLEAR 10/09/2022 1530   LABSPEC 1.010 10/09/2022 1530   PHURINE 5.0 10/09/2022 1530   GLUCOSEU 50 (A) 10/09/2022 1530   HGBUR NEGATIVE 10/09/2022 1530   BILIRUBINUR NEGATIVE 10/09/2022 1530   KETONESUR NEGATIVE 10/09/2022 1530   PROTEINUR NEGATIVE 10/09/2022 1530   NITRITE NEGATIVE 10/09/2022 1530   LEUKOCYTESUR TRACE (A) 10/09/2022 1530   Sepsis Labs: @LABRCNTIP (procalcitonin:4,lacticidven:4) )No results found for this or any previous visit (from the past 240 hour(s)).   Radiological Exams on Admission: CT ABDOMEN PELVIS WO CONTRAST  Result Date: 01/12/2023 CLINICAL DATA:  Abdominal pain, chest pain.  Nausea, vomiting EXAM: CT ABDOMEN AND PELVIS WITHOUT CONTRAST TECHNIQUE: Multidetector CT imaging of the abdomen and pelvis was performed following the standard protocol without IV contrast. RADIATION DOSE REDUCTION: This exam was performed according to the departmental dose-optimization program which includes automated exposure control, adjustment of the mA and/or kV according to patient size and/or use of iterative reconstruction technique. COMPARISON:  10/09/2022 FINDINGS: Lower chest: Scarring in the right lower lobe posteriorly. No effusions. Hepatobiliary: No focal liver abnormality is seen. Status post cholecystectomy. No biliary dilatation. Pancreas: No focal abnormality or ductal  dilatation. Spleen: No focal abnormality.  Normal size. Adrenals/Urinary Tract: Prior left nephrectomy. No renal or adrenal mass. No stones or hydronephrosis on the  right. Urinary bladder unremarkable. Stomach/Bowel: Postoperative changes in the right colon. Scattered colonic diverticula. No active diverticulitis. Stomach and small bowel decompressed, unremarkable. Vascular/Lymphatic: Aortic atherosclerosis. No evidence of aneurysm or adenopathy. Reproductive: No visible focal abnormality. Other: No free fluid or free air. Musculoskeletal: No acute bony abnormality. IMPRESSION: No acute findings in the abdomen or pelvis. Aortic atherosclerosis. Scattered colonic diverticulosis.  No active diverticulitis. Electronically Signed   By: Charlett Nose M.D.   On: 01/12/2023 20:34   DG Chest 2 View  Result Date: 01/12/2023 CLINICAL DATA:  Chest pain, nausea, and vomiting for 2 days. Coronary artery disease. EXAM: CHEST - 2 VIEW COMPARISON:  10/23/2022 FINDINGS: The heart size and mediastinal contours are within normal limits. Both lungs are clear. IMPRESSION: No active cardiopulmonary disease. Electronically Signed   By: Danae Orleans M.D.   On: 01/12/2023 17:54    EKG: Independently reviewed. Atrial fibrillation.   Assessment/Plan   1. AKI superimposed on CKD 3A  - SCr is 3.00 on admission, up from 1.38 in August 2024  - Hx of left nephrectomy, right kidney appears normal with no hydro on CT in ED  - Hold Lasix and Jardiance, continue IVF hydration, renally-dose medications, repeat chem panel in am   2. Lactic acidosis; leukocytosis  - WBC is 36,400 and lactate 3.6 in ED  - Sxs are primarily GI but no evidence for infection on CT abd/pelvis  - Culture blood, continue empiric antibiotics, check C diff and GI pathogen panel, continue IVF hydration, repeat lactic acid   3. CAD  - No anginal symptoms   4. Atrial fibrillation  - Continue amiodarone and Xarelto    5. Chronic HFpEF  - Appears hypovolemic    - Hold Lasix, monitor volume status   6. Depression, anxiety  - Continue Wellbutrin, Paxil, and Seroquel    7. Hypokalemia  - Replaced in ED, will repeat chem panel in am    DVT prophylaxis: Xarelto  Code Status: Full  Level of Care: Level of care: Progressive Family Communication: none present  Disposition Plan:  Patient is from: home  Anticipated d/c is to: TBD Anticipated d/c date is: 01/15/23  Patient currently: Pending improved renal function, tolerance of adequate oral intake, cultures  Consults called: None  Admission status: Inpatient     Briscoe Deutscher, MD [facility]  01/12/2023, 9:55 PM

## 2023-01-12 NOTE — Progress Notes (Signed)
ED Pharmacy Antibiotic Sign Off An antibiotic consult was received from an ED provider for cefepime per pharmacy dosing for sepsis. A chart review was completed to assess appropriateness.   The following one time order(s) were placed:  Cefepime 2g IV x 1  Further antibiotic and/or antibiotic pharmacy consults should be ordered by the admitting provider if indicated.   Thank you for allowing pharmacy to be a part of this patient's care.   Daylene Posey, [facility]  Clinical Pharmacist 01/12/23 8:51 PM

## 2023-01-12 NOTE — ED Provider Notes (Signed)
[place] EMERGENCY DEPARTMENT AT [facility] Provider Note   CSN: [id number] Arrival date & time: 01/12/23  1636     History {Add pertinent medical, surgical, social history, OB history to HPI:1} Chief Complaint  Patient presents with   Chest Pain    LANCER PERCLE is a 72 y.o. male.   Chest Pain 72 year old male history of CAD, HFrEF, CKD, hypertension, hyperlipidemia, atrial fibrillation presenting for chest and abdominal pain.  He states for the last 6 days he is felt poor.  He stopped taking his medications for the last 6 days although took them this morning.  About 6 days he developed pain in his lower chest and epigastric region.  It is somewhat sharp, intermittent, worse with eating.  He has nausea but no vomiting.  He has some nonbloody diarrhea, no melena.  Subjective fevers and chills.  No shortness of breath.  No leg swelling.  He reports recurrent diverticulitis in the past but denies any recent antibiotic use.  Is otherwise been at his baseline health.     Home Medications Prior to Admission medications   Medication Sig Start Date End Date Taking? Authorizing Provider  acetaminophen (TYLENOL) 500 MG tablet Take 2 tablets (1,000 mg total) by mouth every 6 (six) hours. Patient taking differently: Take 1,000 mg by mouth daily as needed for moderate pain. 08/28/22   Swayze, Ava, DO  amiodarone (PACERONE) 200 MG tablet Take 1/2 tablet (100 mg total) by mouth daily. 08/29/22   Swayze, Ava, DO  buPROPion (WELLBUTRIN XL) 150 MG 24 hr tablet Take 1 tablet (150 mg total) by mouth daily. 05/24/22   Elgergawy, Leana Roe, MD  cephALEXin (KEFLEX) 500 MG capsule Take 1 capsule (500 mg total) by mouth 4 (four) times daily. 10/23/22   Durwin Glaze, MD  cyanocobalamin (VITAMIN B12) 1000 MCG/ML injection Inject into the muscle. 09/01/22   [provider]  empagliflozin (JARDIANCE) 10 MG TABS tablet Take 1 tablet (10 mg total) by mouth daily. 06/18/22   Marjie Skiff E,  PA-C  ferrous sulfate 325 (65 FE) MG EC tablet Take 1 tablet (325 mg total) by mouth 2 (two) times daily. 07/31/22 09/27/22  Jerald Kief, MD  fluticasone (FLONASE) 50 MCG/ACT nasal spray Place 1 spray into both nostrils daily as needed for allergies. 12/24/21   [provider]  furosemide (LASIX) 40 MG tablet Take 1 tablet (40 mg total) by mouth daily. 10/23/22   Durwin Glaze, MD  lactose free nutrition (BOOST PLUS) LIQD Take 237 mLs by mouth 2 (two) times daily between meals. 08/29/22   Swayze, Ava, DO  methocarbamol (ROBAXIN) 500 MG tablet Take 1 tablet (500 mg total) by mouth every 8 (eight) hours as needed for muscle spasms. 08/28/22   Swayze, Ava, DO  Multiple Vitamin (MULTIVITAMIN WITH MINERALS) TABS tablet Take 1 tablet by mouth daily. 08/29/22   Swayze, Ava, DO  ondansetron (ZOFRAN) 4 MG tablet Take 1 tablet (4 mg total) by mouth every 8 (eight) hours as needed for nausea or vomiting. 09/13/22 09/13/23  Rai, Ripudeep K, MD  oxyCODONE (OXY IR/ROXICODONE) 5 MG immediate release tablet Take 1 tablet (5 mg total) by mouth every 6 (six) hours as needed for moderate pain. 08/28/22   Swayze, Ava, DO  pantoprazole (PROTONIX) 40 MG tablet Take 1 tablet (40 mg total) by mouth daily. 05/11/22   Pokhrel, Rebekah Chester[place], MD  PARoxetine (PAXIL) 30 MG tablet Take 30 mg by mouth daily.    [provider]  prasugrel (EFFIENT) 10 MG TABS tablet Take 10 mg by mouth daily. 09/12/22   [provider]  pregabalin (LYRICA) 150 MG capsule Take 1 capsule (150 mg total) by mouth 2 (two) times daily. 05/24/22   Elgergawy, Leana Roe, MD  QUEtiapine (SEROQUEL) 25 MG tablet Take 1 tablet (25 mg total) by mouth at bedtime. 05/10/22   Pokhrel, Rebekah Chesterfield, MD  Rivaroxaban (XARELTO) 15 MG TABS tablet Take 1 tablet (15 mg total) by mouth daily with supper. 08/28/22   Swayze, Ava, DO  rosuvastatin (CRESTOR) 20 MG tablet Take 1 tablet (20 mg total) by mouth daily. 06/18/22   Marjie Skiff E, PA-C      Allergies    Levitra  [vardenafil], Aspirin, and Lipitor [atorvastatin]    Review of Systems   Review of Systems  Cardiovascular:  Positive for chest pain.  Review of systems completed and notable as per HPI.  ROS otherwise negative.   Physical Exam Updated Vital Signs BP 128/71 (BP Location: Right Arm)   Pulse 69   Temp (!) 97.5 F (36.4 C)   Resp 17   Ht 5\' 10"  (1.778 m)   Wt 99.8 kg   SpO2 100%   BMI 31.57 kg/m  Physical Exam Vitals and nursing note reviewed.  Constitutional:      General: He is not in acute distress.    Appearance: He is well-developed.  HENT:     Head: Normocephalic and atraumatic.     Mouth/Throat:     Mouth: Mucous membranes are dry.     Pharynx: Oropharynx is clear.  Eyes:     Extraocular Movements: Extraocular movements intact.     Conjunctiva/sclera: Conjunctivae normal.     Pupils: Pupils are equal, round, and reactive to light.  Cardiovascular:     Rate and Rhythm: Normal rate and regular rhythm.     Pulses: Normal pulses.     Heart sounds: Normal heart sounds. No murmur heard. Pulmonary:     Effort: Pulmonary effort is normal. No respiratory distress.     Breath sounds: Normal breath sounds.  Abdominal:     Palpations: Abdomen is soft.     Tenderness: There is abdominal tenderness. There is no guarding or rebound.     Comments: Mild epigastric pain  Musculoskeletal:        General: No swelling.     Cervical back: Neck supple.     Right lower leg: No edema.     Left lower leg: No edema.  Skin:    General: Skin is warm and dry.     Capillary Refill: Capillary refill takes less than 2 seconds.  Neurological:     General: No focal deficit present.     Mental Status: He is alert and oriented to person, place, and time. Mental status is at baseline.  Psychiatric:        Mood and Affect: Mood normal.     ED Results / Procedures / Treatments   Labs (all labs ordered are listed, but only abnormal results are displayed) Labs Reviewed  CBC - Abnormal;  Notable for the following components:      Result Value   WBC 36.4 (*)    RBC 5.91 (*)    Hemoglobin 17.2 (*)    All other components within normal limits  BASIC METABOLIC PANEL  TROPONIN I (HIGH SENSITIVITY)    EKG None  Radiology No results found.  Procedures Procedures  {Document cardiac monitor, telemetry assessment procedure when appropriate:1}  Medications Ordered  in ED Medications - No data to display  ED Course/ Medical Decision Making/ A&P   {   Click here for ABCD2, HEART and other calculatorsREFRESH Note before signing :1}                              Medical Decision Making Amount and/or Complexity of Data Reviewed Labs: ordered. Radiology: ordered.   Medical Decision Making:   CYRIE GAMARRA is a 72 y.o. male who presented to the ED today with epigastric pain, diarrhea, nausea.  Vital signs reviewed.  Pain is mostly in his epigastrium and lower chest.  His EKG shows atrial fibrillation, he states he is normally in sinus rhythm.  There is significant artifact which limits interpretation, may be some lateral ST depression but no STEMI.  Will repeat.  Will evaluate with troponin for evaluation of ACS but given predominant abdominal pain reproducible on exam and diarrhea I suspect this is more likely intra-abdominal and cause.  Declines pain medication at this time.  His white blood count 36,000, consider possible C. difficile.  I do not see any recent steroid use to explain his leukocytosis.   {crccomplexity:27900} Reviewed and confirmed nursing documentation for past medical history, family history, social history.  Initial Study Results:   Laboratory  All laboratory results reviewed.  Labs notable for ***  ***EKG EKG was reviewed independently. Rate, rhythm, axis, intervals all examined and without medically relevant abnormality. ST segments without concerns for elevations.    Radiology:  All images reviewed independently. ***Agree with radiology report  at this time.      Consults: Case discussed with ***.   Reassessment and Plan:   ***    Patient's presentation is most consistent with {EM COPA:27473}     {Document critical care time when appropriate:1} {Document review of labs and clinical decision tools ie heart score, Chads2Vasc2 etc:1}  {Document your independent review of radiology images, and any outside records:1} {Document your discussion with family members, caretakers, and with consultants:1} {Document social determinants of health affecting pt's care:1} {Document your decision making why or why not admission, treatments were needed:1} Final Clinical Impression(s) / ED Diagnoses Final diagnoses:  None    Rx / DC Orders ED Discharge Orders     None

## 2023-01-13 DIAGNOSIS — N183 Chronic kidney disease, stage 3 unspecified: Secondary | ICD-10-CM | POA: Diagnosis not present

## 2023-01-13 DIAGNOSIS — N179 Acute kidney failure, unspecified: Secondary | ICD-10-CM

## 2023-01-13 LAB — HEPATIC FUNCTION PANEL
ALT: 18 U/L (ref 0–44)
AST: 20 U/L (ref 15–41)
Albumin: 2.7 g/dL — ABNORMAL LOW (ref 3.5–5.0)
Alkaline Phosphatase: 59 U/L (ref 38–126)
Bilirubin, Direct: 0.3 mg/dL — ABNORMAL HIGH (ref 0.0–0.2)
Indirect Bilirubin: 1.3 mg/dL — ABNORMAL HIGH (ref 0.3–0.9)
Total Bilirubin: 1.6 mg/dL — ABNORMAL HIGH (ref <1.2)
Total Protein: 5.4 g/dL — ABNORMAL LOW (ref 6.5–8.1)

## 2023-01-13 LAB — BASIC METABOLIC PANEL
Anion gap: 10 (ref 5–15)
BUN: 34 mg/dL — ABNORMAL HIGH (ref 8–23)
CO2: 25 mmol/L (ref 22–32)
Calcium: 7.9 mg/dL — ABNORMAL LOW (ref 8.9–10.3)
Chloride: 105 mmol/L (ref 98–111)
Creatinine, Ser: 2.55 mg/dL — ABNORMAL HIGH (ref 0.61–1.24)
GFR, Estimated: 26 mL/min — ABNORMAL LOW (ref >60)
Glucose, Bld: 76 mg/dL (ref 70–99)
Potassium: 3 mmol/L — ABNORMAL LOW (ref 3.5–5.1)
Sodium: 140 mmol/L (ref 135–145)

## 2023-01-13 LAB — CBC
HCT: 44.5 % (ref 39.0–52.0)
Hemoglobin: 15.1 g/dL (ref 13.0–17.0)
MCH: 29.2 pg (ref 26.0–34.0)
MCHC: 33.9 g/dL (ref 30.0–36.0)
MCV: 86.1 fL (ref 80.0–100.0)
Platelets: 206 10*3/uL (ref 150–400)
RBC: 5.17 MIL/uL (ref 4.22–5.81)
RDW: 13.1 % (ref 11.5–15.5)
WBC: 20.9 10*3/uL — ABNORMAL HIGH (ref 4.0–10.5)
nRBC: 0 % (ref 0.0–0.2)

## 2023-01-13 MED ORDER — POTASSIUM CHLORIDE CRYS ER 20 MEQ PO TBCR
20.0000 meq | EXTENDED_RELEASE_TABLET | Freq: Once | ORAL | Status: AC
  Administered 2023-01-13: 20 meq via ORAL
  Filled 2023-01-13: qty 1

## 2023-01-13 MED ORDER — VANCOMYCIN VARIABLE DOSE PER UNSTABLE RENAL FUNCTION (PHARMACIST DOSING): Status: DC

## 2023-01-13 NOTE — ED Notes (Signed)
The pt is sleeping quietly for the first time tonight

## 2023-01-13 NOTE — Plan of Care (Signed)
  Problem: Fluid Volume: Goal: Hemodynamic stability will improve Outcome: Progressing   

## 2023-01-13 NOTE — ED Notes (Signed)
Pt c/o the noise from the monitor iv not running upset about not having a room upstairsyelling whenever he had to usethe urinal and about the wires that were attached to him he was off the bed and almost fell in the process  he did not want to get back in bed and he also threatened to leave have someone pick him up. He kept reporting that he was promised a bed upstairs and it was not true and he wanted to talk to the admitting  doctor about the poor service he had received here

## 2023-01-13 NOTE — ED Notes (Signed)
Attempted to restart pt's Iv pump , pt jumped up out of bed yelling and cursing at RN to get out of room

## 2023-01-13 NOTE — ED Notes (Signed)
The iv team came and placed another iv in his lt upper arm that move satisfied him because  the monitor stopped alarming and his iv fluids were infusing silently

## 2023-01-13 NOTE — ED Notes (Signed)
ED TO INPATIENT HANDOFF REPORT  ED Nurse Name and Phone #: [phone]  S Name/Age/Gender Brendan Butler 72 y.o. male Room/Bed: 037C/037C  Code Status   Code Status: Full Code  Home/SNF/Other Home Patient oriented to: self, place, and time Is this baseline? Yes   Triage Complete: Triage complete  Chief Complaint Acute renal failure superimposed on stage 3 chronic kidney disease, unspecified acute renal failure type, unspecified whether stage 3a or 3b CKD (HCC) [N17.9, N18.30]  Triage Note Pt here for centralized CP with nausea x 2 days. Reports vomiting last night. Hx of cardiac stents, states that he has not taken any of his medications for the past week.    Allergies Allergies  Allergen Reactions   Levitra [Vardenafil] Other (See Comments)    Blurred vision   Asa [Aspirin] Hives   Lipitor [Atorvastatin] Other (See Comments)    Myalgias     Level of Care/Admitting Diagnosis ED Disposition     ED Disposition  Admit   Condition  --   Comment  Hospital [facility] [[id number]]  Level of Care: Progressive [102]  Admit to Progressive based on following criteria: MULTISYSTEM THREATS such as stable sepsis, metabolic/electrolyte imbalance with or without encephalopathy that is responding to early treatment.  May admit patient to [facility] or [facility] if equivalent level of care is available:: Yes  Covid Evaluation: Asymptomatic - no recent exposure (last 10 days) testing not required  Diagnosis: Acute renal failure superimposed on stage 3 chronic kidney disease, unspecified acute renal failure type, unspecified whether stage 3a or 3b CKD [facility]) [[id number]]  Admitting Physician: Briscoe Deutscher [[id number]]  Attending Physician: Briscoe Deutscher [[id number]]  Certification:: I certify this patient will need inpatient services for at least 2 midnights  Expected Medical Readiness: 01/14/2023          B Medical/Surgery History Past Medical History:   Diagnosis Date   Basal ganglia infarction (HCC)    Bradycardia    CAD (coronary artery disease)    Chronic HFrEF (heart failure with reduced ejection fraction) (HCC)    Chronic kidney disease, stage 3 (HCC)    Colon cancer (HCC) 08/26/2022   Erectile dysfunction    Hyperlipidemia LDL goal <70    Hypertension    OSA (obstructive sleep apnea)    No CPAP use overnight   PAF (paroxysmal atrial fibrillation) (HCC)    Paroxysmal atrial flutter (HCC)    Premature atrial contractions    Pulmonary nodules    Past Surgical History:  Procedure Laterality Date   BACK SURGERY  2007   Decompression lumbar laminectomy and micro discectomy, L4-5   BIOPSY  08/20/2022   Procedure: BIOPSY;  Surgeon: Vida Rigger, MD;  Location: [facility] ENDOSCOPY;  Service: Gastroenterology;;   BRONCHIAL BIOPSY  12/28/2021   Procedure: BRONCHIAL BIOPSIES;  Surgeon: Leslye Peer, MD;  Location: [facility] ENDOSCOPY;  Service: Pulmonary;;   BRONCHIAL BRUSHINGS  12/28/2021   Procedure: BRONCHIAL BRUSHINGS;  Surgeon: Leslye Peer, MD;  Location: [facility] ENDOSCOPY;  Service: Pulmonary;;   BRONCHIAL NEEDLE ASPIRATION BIOPSY  12/28/2021   Procedure: BRONCHIAL NEEDLE ASPIRATION BIOPSIES;  Surgeon: Leslye Peer, MD;  Location: [facility] ENDOSCOPY;  Service: Pulmonary;;   BRONCHIAL WASHINGS  12/28/2021   Procedure: BRONCHIAL WASHINGS;  Surgeon: Leslye Peer, MD;  Location: [facility] ENDOSCOPY;  Service: Pulmonary;;   CARDIAC CATHETERIZATION  2007   patent stent to LAD and normal Cors   CARDIOVERSION N/A 05/10/2022   Procedure: CARDIOVERSION;  Surgeon:  Sande Rives, MD;  Location: [facility] ENDOSCOPY;  Service: Cardiovascular;  Laterality: N/A;   CARDIOVERSION N/A 05/28/2022   Procedure: CARDIOVERSION;  Surgeon: Wendall Stade, MD;  Location: [facility] ENDOSCOPY;  Service: Cardiovascular;  Laterality: N/A;   CHOLECYSTECTOMY N/A 12/26/2021   Procedure: LAPAROSCOPIC CHOLECYSTECTOMY, LAPAROSCOPIC LYSIS OF ADHESIONS GREATER THAN 1 HOUR;  Surgeon: [name], MD;  Location[facility] OR;  Service: General;  Laterality: N/A;   COLONOSCOPY WITH PROPOFOL N/A 08/20/2022   Procedure: COLONOSCOPY WITH PROPOFOL;  Surgeon: Vida Rigger, MD;  Location: [facility] ENDOSCOPY;  Service: Gastroenterology;  Laterality: N/A;   CORONARY ANGIOPLASTY WITH STENT PLACEMENT  2001   to LAD   CORONARY ANGIOPLASTY WITH STENT PLACEMENT  12/29/12   STEMI with promus DES to LAD   CORONARY ANGIOPLASTY WITH STENT PLACEMENT  01/05/13   STEMI with thrombosis in stent to LAD   ESOPHAGOGASTRODUODENOSCOPY (EGD) WITH PROPOFOL N/A 08/20/2022   Procedure: ESOPHAGOGASTRODUODENOSCOPY (EGD) WITH PROPOFOL;  Surgeon: Vida Rigger, MD;  Location: [facility] ENDOSCOPY;  Service: Gastroenterology;  Laterality: N/A;   FIDUCIAL MARKER PLACEMENT  12/28/2021   Procedure: FIDUCIAL MARKER PLACEMENT;  Surgeon: Leslye Peer, MD;  Location: [facility] ENDOSCOPY;  Service: Pulmonary;;   LAPAROSCOPIC PARTIAL COLECTOMY Right 08/23/2022   Procedure: LAPAROSCOPIC CONVERTED TO OPEN HEMICOLECTOMY;  Surgeon: Axel Filler, MD;  Location: [facility] OR;  Service: General;  Laterality: Right;   LEFT HEART CATH N/A 12/29/2012   Procedure: LEFT HEART CATH;  Surgeon: Corky Crafts, MD;  Location: [facility] CATH LAB;  Service: Cardiovascular;  Laterality: N/A;   LEFT HEART CATHETERIZATION WITH CORONARY ANGIOGRAM N/A 01/05/2013   Procedure: LEFT HEART CATHETERIZATION WITH CORONARY ANGIOGRAM;  Surgeon: Micheline Chapman, MD;  Location: [facility] CATH LAB;  Service: Cardiovascular;  Laterality: N/A;   LEFT HEART CATHETERIZATION WITH CORONARY ANGIOGRAM N/A 04/22/2013   Procedure: LEFT HEART CATHETERIZATION WITH CORONARY ANGIOGRAM;  Surgeon: Lennette Bihari, MD;  Location: [facility] CATH LAB;  Service: Cardiovascular;  Laterality: N/A;   PARTIAL NEPHRECTOMY Left 1975   PATIENT ONLY HAS ONE KIDNEY   PERCUTANEOUS CORONARY STENT INTERVENTION (PCI-S)  12/29/2012   Procedure: PERCUTANEOUS CORONARY STENT INTERVENTION (PCI-S);  Surgeon: Corky Crafts, MD;  Location: [facility]  CATH LAB;  Service: Cardiovascular;;   SUBMUCOSAL TATTOO INJECTION  08/20/2022   Procedure: SUBMUCOSAL TATTOO INJECTION;  Surgeon: Vida Rigger, MD;  Location: [facility] ENDOSCOPY;  Service: Gastroenterology;;   TEE WITHOUT CARDIOVERSION N/A 05/10/2022   Procedure: TRANSESOPHAGEAL ECHOCARDIOGRAM (TEE);  Surgeon: Sande Rives, MD;  Location: [facility] ENDOSCOPY;  Service: Cardiovascular;  Laterality: N/A;   TEE WITHOUT CARDIOVERSION N/A 05/28/2022   Procedure: TRANSESOPHAGEAL ECHOCARDIOGRAM (TEE);  Surgeon: Wendall Stade, MD;  Location: [facility] ENDOSCOPY;  Service: Cardiovascular;  Laterality: N/A;     A IV Location/Drains/Wounds Patient Lines/Drains/Airways Status     Active Line/Drains/Airways     Name Placement date Placement time Site Days   Peripheral IV 01/12/23 20 G Right Antecubital 01/12/23  1922  Antecubital  1   Peripheral IV 01/13/23 20 G 1.88" Anterior;Left;Upper Arm 01/13/23  0104  Arm  less than 1            Intake/Output Last 24 hours  Intake/Output Summary (Last 24 hours) at 01/13/2023 1538 Last data filed at 01/13/2023 0424 Gross per 24 hour  Intake 4750 ml  Output 2000 ml  Net 2750 ml    Labs/Imaging Results for orders placed or performed during the hospital encounter of 01/12/23 (from the past 48 hour(s))  Basic metabolic panel  Status: Abnormal   Collection Time: 01/12/23  5:03 PM  Result Value Ref Range   Sodium 136 135 - 145 mmol/L   Potassium 2.8 (L) 3.5 - 5.1 mmol/L   Chloride 101 98 - 111 mmol/L   CO2 19 (L) 22 - 32 mmol/L   Glucose, Bld 114 (H) 70 - 99 mg/dL    Comment: Glucose reference range applies only to samples taken after fasting for at least 8 hours.   BUN 43 (H) 8 - 23 mg/dL   Creatinine, Ser 1.61 (H) 0.61 - 1.24 mg/dL   Calcium 8.0 (L) 8.9 - 10.3 mg/dL   GFR, Estimated 22 (L) >60 mL/min    Comment: (NOTE) Calculated using the CKD-EPI Creatinine Equation (2021)    Anion gap 16 (H) 5 - 15    Comment: Performed at [facility] Lab,  [address] [place], [place] [address]  CBC     Status: Abnormal   Collection Time: 01/12/23  5:03 PM  Result Value Ref Range   WBC 36.4 (H) 4.0 - 10.5 K/uL   RBC 5.91 (H) 4.22 - 5.81 MIL/uL   Hemoglobin 17.2 (H) 13.0 - 17.0 g/dL   HCT 54.0 98.1 - 19.1 %   MCV 87.8 80.0 - 100.0 fL   MCH 29.1 26.0 - 34.0 pg   MCHC 33.1 30.0 - 36.0 g/dL   RDW 47.8 29.5 - 62.1 %   Platelets 282 150 - 400 K/uL   nRBC 0.0 0.0 - 0.2 %    Comment: Performed at [facility] Lab, [address] [place], [place] [address]  Troponin I (High Sensitivity)     Status: Abnormal   Collection Time: 01/12/23  5:03 PM  Result Value Ref Range   Troponin I (High Sensitivity) 56 (H) <18 ng/L    Comment: (NOTE) Elevated high sensitivity troponin I (hsTnI) values and significant  changes across serial measurements may suggest ACS but many other  chronic and acute conditions are known to elevate hsTnI results.  Refer to the "Links" section for chest pain algorithms and additional  guidance. Performed at [facility] Lab, [address] [place], [place] [address]   Lipase, blood     Status: None   Collection Time: 01/12/23  6:59 PM  Result Value Ref Range   Lipase 38 11 - 51 U/L    Comment: Performed at [facility] Lab, [address], [place], [place] [address]  Hepatic function panel     Status: Abnormal   Collection Time: 01/12/23  6:59 PM  Result Value Ref Range   Total Protein 6.7 6.5 - 8.1 g/dL   Albumin 3.4 (L) 3.5 - 5.0 g/dL   AST 29 15 - 41 U/L   ALT 20 0 - 44 U/L   Alkaline Phosphatase 74 38 - 126 U/L   Total Bilirubin 1.8 (H) <1.2 mg/dL   Bilirubin, Direct 0.3 (H) 0.0 - 0.2 mg/dL   Indirect Bilirubin 1.5 (H) 0.3 - 0.9 mg/dL    Comment: Performed at [facility] Lab, [address], [place], [place] [address]  Troponin I (High Sensitivity)     Status: Abnormal   Collection Time: 01/12/23  6:59 PM  Result Value Ref Range   Troponin I (High Sensitivity) 58 (H) <18 ng/L    Comment:  (NOTE) Elevated high sensitivity troponin I (hsTnI) values and significant  changes across serial measurements may suggest ACS but many other  chronic and acute conditions are known to elevate  hsTnI results.  Refer to the "Links" section for chest pain algorithms and additional  guidance. Performed at [facility] Lab, [address] [place], [place] [address]   Magnesium     Status: None   Collection Time: 01/12/23  6:59 PM  Result Value Ref Range   Magnesium 2.4 1.7 - 2.4 mg/dL    Comment: Performed at [facility] Lab, [address] [place], [place] [address]  I-Stat CG4 Lactic Acid     Status: Abnormal   Collection Time: 01/12/23  7:06 PM  Result Value Ref Range   Lactic Acid, Venous 2.4 (HH) 0.5 - 1.9 mmol/L   Comment NOTIFIED PHYSICIAN   I-Stat CG4 Lactic Acid     Status: Abnormal   Collection Time: 01/12/23  7:53 PM  Result Value Ref Range   Lactic Acid, Venous 3.6 (HH) 0.5 - 1.9 mmol/L   Comment NOTIFIED PHYSICIAN   Urinalysis, Routine w reflex microscopic -Urine, Clean Catch     Status: Abnormal   Collection Time: 01/12/23  9:38 PM  Result Value Ref Range   Color, Urine YELLOW YELLOW   APPearance CLEAR CLEAR   Specific Gravity, Urine 1.008 1.005 - 1.030   pH 5.0 5.0 - 8.0   Glucose, UA >=500 (A) NEGATIVE mg/dL   Hgb urine dipstick SMALL (A) NEGATIVE   Bilirubin Urine NEGATIVE NEGATIVE   Ketones, ur NEGATIVE NEGATIVE mg/dL   Protein, ur 30 (A) NEGATIVE mg/dL   Nitrite NEGATIVE NEGATIVE   Leukocytes,Ua TRACE (A) NEGATIVE   RBC / HPF 0-5 0 - 5 RBC/hpf   WBC, UA 0-5 0 - 5 WBC/hpf   Bacteria, UA NONE SEEN NONE SEEN   Squamous Epithelial / HPF 0-5 0 - 5 /HPF   Mucus PRESENT    Hyaline Casts, UA PRESENT     Comment: Performed at [facility] Lab, [address], [place], [place] [address]  Lactic acid, plasma     Status: Abnormal   Collection Time: 01/12/23  9:54 PM  Result Value Ref Range   Lactic Acid, Venous 2.1 (HH) 0.5 - 1.9 mmol/L    Comment: CRITICAL  RESULT CALLED TO, READ BACK BY AND VERIFIED WITH Salena Saner CHRISCO, RN AT 2231 11.16.24 JLASIGAN Performed at [facility] Lab, [address] [place], [place] [address]   Blood culture (routine x 2)     Status: None (Preliminary result)   Collection Time: 01/12/23  9:58 PM   Specimen: BLOOD  Result Value Ref Range   Specimen Description BLOOD SITE NOT SPECIFIED    Special Requests      BOTTLES DRAWN AEROBIC AND ANAEROBIC Blood Culture adequate volume   Culture      NO GROWTH < 12 HOURS Performed at [facility] Lab, [address], [place], [place] [address]    Report Status PENDING   Blood culture (routine x 2)     Status: None (Preliminary result)   Collection Time: 01/12/23 10:03 PM   Specimen: BLOOD  Result Value Ref Range   Specimen Description BLOOD SITE NOT SPECIFIED    Special Requests      BOTTLES DRAWN AEROBIC AND ANAEROBIC Blood Culture adequate volume   Culture      NO GROWTH < 12 HOURS Performed at [facility] Lab, [address], [place], [place] [address]    Report Status PENDING   Hepatic function panel     Status: Abnormal   Collection Time: 01/13/23  2:48 AM  Result Value  Ref Range   Total Protein 5.4 (L) 6.5 - 8.1 g/dL   Albumin 2.7 (L) 3.5 - 5.0 g/dL   AST 20 15 - 41 U/L   ALT 18 0 - 44 U/L   Alkaline Phosphatase 59 38 - 126 U/L   Total Bilirubin 1.6 (H) <1.2 mg/dL   Bilirubin, Direct 0.3 (H) 0.0 - 0.2 mg/dL   Indirect Bilirubin 1.3 (H) 0.3 - 0.9 mg/dL    Comment: Performed at [facility] Lab, [address], [place], [place] [address]  Basic metabolic panel     Status: Abnormal   Collection Time: 01/13/23  2:48 AM  Result Value Ref Range   Sodium 140 135 - 145 mmol/L   Potassium 3.0 (L) 3.5 - 5.1 mmol/L   Chloride 105 98 - 111 mmol/L   CO2 25 22 - 32 mmol/L   Glucose, Bld 76 70 - 99 mg/dL    Comment: Glucose reference range applies only to samples taken after fasting for at least 8 hours.   BUN 34 (H) 8 - 23 mg/dL   Creatinine, Ser 9.52 (H)  0.61 - 1.24 mg/dL   Calcium 7.9 (L) 8.9 - 10.3 mg/dL   GFR, Estimated 26 (L) >60 mL/min    Comment: (NOTE) Calculated using the CKD-EPI Creatinine Equation (2021)    Anion gap 10 5 - 15    Comment: Performed at [facility], [address], [place], [place] [address]  CBC     Status: Abnormal   Collection Time: 01/13/23  2:48 AM  Result Value Ref Range   WBC 20.9 (H) 4.0 - 10.5 K/uL   RBC 5.17 4.22 - 5.81 MIL/uL   Hemoglobin 15.1 13.0 - 17.0 g/dL   HCT 44.0 10.2 - 72.5 %   MCV 86.1 80.0 - 100.0 fL   MCH 29.2 26.0 - 34.0 pg   MCHC 33.9 30.0 - 36.0 g/dL   RDW 36.6 44.0 - 34.7 %   Platelets 206 150 - 400 K/uL   nRBC 0.0 0.0 - 0.2 %    Comment: Performed at [facility] Lab, [address], [place], [place] [address]   CT ABDOMEN PELVIS WO CONTRAST  Result Date: 01/12/2023 CLINICAL DATA:  Abdominal pain, chest pain.  Nausea, vomiting EXAM: CT ABDOMEN AND PELVIS WITHOUT CONTRAST TECHNIQUE: Multidetector CT imaging of the abdomen and pelvis was performed following the standard protocol without IV contrast. RADIATION DOSE REDUCTION: This exam was performed according to the departmental dose-optimization program which includes automated exposure control, adjustment of the mA and/or kV according to patient size and/or use of iterative reconstruction technique. COMPARISON:  10/09/2022 FINDINGS: Lower chest: Scarring in the right lower lobe posteriorly. No effusions. Hepatobiliary: No focal liver abnormality is seen. Status post cholecystectomy. No biliary dilatation. Pancreas: No focal abnormality or ductal dilatation. Spleen: No focal abnormality.  Normal size. Adrenals/Urinary Tract: Prior left nephrectomy. No renal or adrenal mass. No stones or hydronephrosis on the right. Urinary bladder unremarkable. Stomach/Bowel: Postoperative changes in the right colon. Scattered colonic diverticula. No active diverticulitis. Stomach and small bowel decompressed, unremarkable. Vascular/Lymphatic:  Aortic atherosclerosis. No evidence of aneurysm or adenopathy. Reproductive: No visible focal abnormality. Other: No free fluid or free air. Musculoskeletal: No acute bony abnormality. IMPRESSION: No acute findings in the abdomen or pelvis. Aortic atherosclerosis. Scattered colonic diverticulosis.  No active diverticulitis. Electronically Signed   By: Charlett Nose M.D.   On: 01/12/2023 20:34   DG Chest 2 View  Result Date: 01/12/2023 CLINICAL  DATA:  Chest pain, nausea, and vomiting for 2 days. Coronary artery disease. EXAM: CHEST - 2 VIEW COMPARISON:  10/23/2022 FINDINGS: The heart size and mediastinal contours are within normal limits. Both lungs are clear. IMPRESSION: No active cardiopulmonary disease. Electronically Signed   By: Danae Orleans M.D.   On: 01/12/2023 17:54    Pending Labs Unresulted Labs (From admission, onward)     Start     Ordered   01/13/23 0500  Basic metabolic panel  Daily,   R      01/12/23 2154   01/13/23 0500  CBC  Daily,   R      01/12/23 2154   01/12/23 2218  Gastrointestinal Panel by PCR , Stool  (Gastrointestinal Panel by PCR, Stool                                                                                                                                                     **Does Not include CLOSTRIDIUM DIFFICILE testing. **If CDIFF testing is needed, place order from the "C Difficile Testing" order set.**)  Once,   R        01/12/23 2217   01/12/23 2217  C Difficile Quick Screen w PCR reflex  (C Difficile quick screen w PCR reflex panel )  Once, for 24 hours,   TIMED       References:    CDiff Information Tool   01/12/23 2217   01/12/23 2007  Culture, blood (single)  (Undifferentiated -> Now sepsis confirmed (treatment and sepsis specific nursing orders))  ONCE - STAT,   STAT        01/12/23 2006            Vitals/Pain Today's Vitals   01/13/23 0749 01/13/23 0839 01/13/23 1000 01/13/23 1300  BP:   127/70 (!) 182/170  Pulse:   (!) 105 93  Resp:    (!) 22 14  Temp:      TempSrc:      SpO2:   97% 99%  Weight:      Height:      PainSc: 0-No pain 0-No pain      Isolation Precautions Enteric precautions (UV disinfection)  Medications Medications  potassium chloride 10 mEq in 100 mL IVPB (10 mEq Intravenous Not Given 01/13/23 0250)  lactated ringers infusion (0 mLs Intravenous Stopped 01/13/23 0142)  amiodarone (PACERONE) tablet 100 mg (100 mg Oral Given 01/13/23 1149)  buPROPion (WELLBUTRIN XL) 24 hr tablet 150 mg (150 mg Oral Given 01/13/23 1148)  QUEtiapine (SEROQUEL) tablet 25 mg (25 mg Oral Given 01/13/23 0218)  pantoprazole (PROTONIX) EC tablet 40 mg (40 mg Oral Given 01/13/23 1149)  prasugrel (EFFIENT) tablet 10 mg (10 mg Oral Given 01/13/23 1149)  Rivaroxaban (XARELTO) tablet 15 mg (15 mg Oral Not Given 01/13/23 0302)  sodium chloride flush (NS) 0.9 %  injection 3 mL (3 mLs Intravenous Given 01/13/23 1329)  acetaminophen (TYLENOL) tablet 650 mg (has no administration in time range)    Or  acetaminophen (TYLENOL) suppository 650 mg (has no administration in time range)  oxyCODONE (Oxy IR/ROXICODONE) immediate release tablet 5 mg (has no administration in time range)  fentaNYL (SUBLIMAZE) injection 12.5-50 mcg (has no administration in time range)  prochlorperazine (COMPAZINE) injection 5 mg (has no administration in time range)  metroNIDAZOLE (FLAGYL) IVPB 500 mg (0 mg Intravenous Stopped 01/13/23 1329)  PARoxetine (PAXIL) tablet 10 mg (10 mg Oral Given 01/13/23 1147)  pregabalin (LYRICA) capsule 75 mg (75 mg Oral Given 01/13/23 1148)  rosuvastatin (CRESTOR) tablet 10 mg (10 mg Oral Given 01/13/23 1148)  ceFEPIme (MAXIPIME) 2 g in sodium chloride 0.9 % 100 mL IVPB (has no administration in time range)  vancomycin variable dose per unstable renal function (pharmacist dosing) (has no administration in time range)  lactated ringers bolus 500 mL (0 mLs Intravenous Stopped 01/12/23 2057)  lactated ringers bolus 1,000 mL (0 mLs  Intravenous Stopped 01/12/23 2215)  ceFEPIme (MAXIPIME) 2 g in sodium chloride 0.9 % 100 mL IVPB (0 g Intravenous Stopped 01/12/23 2109)  metroNIDAZOLE (FLAGYL) IVPB 500 mg (0 mg Intravenous Stopped 01/12/23 2215)  sodium chloride 0.9 % bolus 1,000 mL (0 mLs Intravenous Stopped 01/13/23 0231)  potassium chloride SA (KLOR-CON M) CR tablet 20 mEq (20 mEq Oral Given 01/13/23 0404)    Mobility walks with person assist     Focused Assessments Cardiac Assessment Handoff:  Cardiac Rhythm: Atrial fibrillation Lab Results  Component Value Date   CKTOTAL 69 10/12/2022   CKMB 1.4 10/12/2022   TROPONINI <0.03 01/28/2016   Lab Results  Component Value Date   DDIMER 0.30 08/17/2022   Does the Patient currently have chest pain? No    R Recommendations: See Admitting Provider Note  Report given to:   Additional Notes:

## 2023-01-13 NOTE — Progress Notes (Signed)
PROGRESS NOTE    Brendan Butler  YNW:[id number] DOB: 1950-11-05 DOA: 01/12/2023 PCP: [name], MD   Brief Narrative:  Brendan Butler is a 72 y.o. male with medical history significant for atrial fibrillation on Xarelto, CAD, chronic HFpEF, depression, anxiety, colon cancer status post hemicolectomy in June 2024, and CKD 3A who presents with epigastric pain, nausea, vomiting, and loose stools over the past week. Found to have profound AKI on his known baseline CKD3a requiring IVF at intake.   Assessment & Plan:   Principal Problem:   Acute renal failure superimposed on stage 3 chronic kidney disease, unspecified acute renal failure type, unspecified whether stage 3a or 3b CKD (HCC) Active Problems:   Coronary artery disease involving native coronary artery of native heart without angina pectoris   Depression with anxiety   OSA on CPAP   Hypokalemia   Persistent atrial fibrillation (HCC)   Lactic acidosis  Severe sepsis secondary to intractable nausea, vomiting, diarrhea, POA, resolving Lactic acidosis -Noted prior to admission limiting PO intake and resulting in dehydration and AKI -Continue symptomatic control - improving daily -Likely viral etiology given rapid/profound onset and improvement -GI panel pending/C diff pending  AKI superimposed on CKD 3A Left nephrectomy(distant)  -Baseline noted to be 1.38 in August 2024  -Hold home lasix and Jardiance -Continue IVF x 1 day - stop date 1800 01/14/23 -Continue to advance diet as tolerated    Atrial fibrillation  - Continue amiodarone and Xarelto  - rate controlled   Chronic HFpEF  - Appears hypovolemic   - Hold Lasix, monitor volume status - low threshold to stop IVF if respiratory status changes   Depression, anxiety  - Continue Wellbutrin, Paxil, and Seroquel     Hypokalemia  - Repleted - follow repeat labs - given improvement in GI losses K should stabilize  CAD, chronic, stable - No anginal symptoms    DVT prophylaxis:  Rivaroxaban (XARELTO) tablet 15 mg   Code Status:   Code Status: Full Code  Family Communication: None present  Status is: Inpt  Dispo: The patient is from: Home              Anticipated d/c is to: Home              Anticipated d/c date is: 24-48h              Patient currently NOT medically stable for discharge  Consultants:  None  Procedures:  None  Antimicrobials:  Cefepime Flagyl Vancomycin   Subjective: No acute issues/events overnight - no noted N/V/D this morning  Objective: Vitals:   01/13/23 0148 01/13/23 0200 01/13/23 0625 01/13/23 0747  BP: 131/82 121/75 (!) 104/56 107/63  Pulse: 89 84 97 (!) 107  Resp: 16 17 19 18   Temp: 98.1 F (36.7 C)  (!) 97.3 F (36.3 C)   TempSrc: Oral  Oral   SpO2: 98% 96% 97% 96%  Weight:      Height:        Intake/Output Summary (Last 24 hours) at 01/13/2023 0816 Last data filed at 01/13/2023 0424 Gross per 24 hour  Intake 4750 ml  Output 2000 ml  Net 2750 ml   Filed Weights   01/12/23 1648  Weight: 99.8 kg    Examination:  General exam: Appears calm and comfortable  Respiratory system: Clear to auscultation. Respiratory effort normal. Cardiovascular system: S1 & S2 heard, RRR. No JVD, murmurs, rubs, gallops or clicks. No pedal edema. Gastrointestinal system: Abdomen is nondistended,  soft and nontender. No organomegaly or masses felt. Normal bowel sounds heard. Central nervous system: Alert and oriented. No focal neurological deficits. Extremities: Symmetric 5 x 5 power. Skin: No rashes, lesions or ulcers  Data Reviewed: I have personally reviewed following labs and imaging studies  CBC: Recent Labs  Lab 01/12/23 1703 01/13/23 0248  WBC 36.4* 20.9*  HGB 17.2* 15.1  HCT 51.9 44.5  MCV 87.8 86.1  PLT 282 206   Basic Metabolic Panel: Recent Labs  Lab 01/12/23 1703 01/12/23 1859 01/13/23 0248  NA 136  --  140  K 2.8*  --  3.0*  CL 101  --  105  CO2 19*  --  25  GLUCOSE 114*   --  76  BUN 43*  --  34*  CREATININE 3.00*  --  2.55*  CALCIUM 8.0*  --  7.9*  MG  --  2.4  --    GFR: Estimated Creatinine Clearance: 32.4 mL/min (A) (by C-G formula based on SCr of 2.55 mg/dL (H)).  Liver Function Tests: Recent Labs  Lab 01/12/23 1859 01/13/23 0248  AST 29 20  ALT 20 18  ALKPHOS 74 59  BILITOT 1.8* 1.6*  PROT 6.7 5.4*  ALBUMIN 3.4* 2.7*   Recent Labs  Lab 01/12/23 1859  LIPASE 38   Sepsis Labs: Recent Labs  Lab 01/12/23 1906 01/12/23 1953 01/12/23 2154  LATICACIDVEN 2.4* 3.6* 2.1*   No results found for this or any previous visit (from the past 240 hour(s)).   Radiology Studies: CT ABDOMEN PELVIS WO CONTRAST  Result Date: 01/12/2023 CLINICAL DATA:  Abdominal pain, chest pain.  Nausea, vomiting EXAM: CT ABDOMEN AND PELVIS WITHOUT CONTRAST TECHNIQUE: Multidetector CT imaging of the abdomen and pelvis was performed following the standard protocol without IV contrast. RADIATION DOSE REDUCTION: This exam was performed according to the departmental dose-optimization program which includes automated exposure control, adjustment of the mA and/or kV according to patient size and/or use of iterative reconstruction technique. COMPARISON:  10/09/2022 FINDINGS: Lower chest: Scarring in the right lower lobe posteriorly. No effusions. Hepatobiliary: No focal liver abnormality is seen. Status post cholecystectomy. No biliary dilatation. Pancreas: No focal abnormality or ductal dilatation. Spleen: No focal abnormality.  Normal size. Adrenals/Urinary Tract: Prior left nephrectomy. No renal or adrenal mass. No stones or hydronephrosis on the right. Urinary bladder unremarkable. Stomach/Bowel: Postoperative changes in the right colon. Scattered colonic diverticula. No active diverticulitis. Stomach and small bowel decompressed, unremarkable. Vascular/Lymphatic: Aortic atherosclerosis. No evidence of aneurysm or adenopathy. Reproductive: No visible focal abnormality. Other: No  free fluid or free air. Musculoskeletal: No acute bony abnormality. IMPRESSION: No acute findings in the abdomen or pelvis. Aortic atherosclerosis. Scattered colonic diverticulosis.  No active diverticulitis. Electronically Signed   By: Charlett Nose M.D.   On: 01/12/2023 20:34   DG Chest 2 View  Result Date: 01/12/2023 CLINICAL DATA:  Chest pain, nausea, and vomiting for 2 days. Coronary artery disease. EXAM: CHEST - 2 VIEW COMPARISON:  10/23/2022 FINDINGS: The heart size and mediastinal contours are within normal limits. Both lungs are clear. IMPRESSION: No active cardiopulmonary disease. Electronically Signed   By: Danae Orleans M.D.   On: 01/12/2023 17:54        Scheduled Meds:  amiodarone  100 mg Oral Daily   buPROPion  150 mg Oral Daily   pantoprazole  40 mg Oral Daily   PARoxetine  10 mg Oral Daily   prasugrel  10 mg Oral Daily   pregabalin  75 mg  Oral Daily   QUEtiapine  25 mg Oral QHS   Rivaroxaban  15 mg Oral Q supper   rosuvastatin  10 mg Oral Daily   sodium chloride flush  3 mL Intravenous Q12H   Continuous Infusions:  ceFEPime (MAXIPIME) IV     lactated ringers Stopped (01/13/23 0142)   metronidazole     vancomycin Stopped (01/13/23 0316)     LOS: 1 day   Time spent:  Azucena Fallen, DO [facility]  If 7PM-7AM, please contact night-coverage [email or web address]  01/13/2023, 8:16 AM

## 2023-01-13 NOTE — ED Notes (Signed)
The hospital bed that was ordered showed  up  pt was moved  onto a regular hospital bed for comfort and he starting  quieting  down more  .

## 2023-01-14 DIAGNOSIS — N183 Chronic kidney disease, stage 3 unspecified: Secondary | ICD-10-CM | POA: Diagnosis not present

## 2023-01-14 DIAGNOSIS — N179 Acute kidney failure, unspecified: Secondary | ICD-10-CM | POA: Diagnosis not present

## 2023-01-14 LAB — CBC
HCT: 40.1 % (ref 39.0–52.0)
Hemoglobin: 13.5 g/dL (ref 13.0–17.0)
MCH: 28.9 pg (ref 26.0–34.0)
MCHC: 33.7 g/dL (ref 30.0–36.0)
MCV: 85.9 fL (ref 80.0–100.0)
Platelets: 185 10*3/uL (ref 150–400)
RBC: 4.67 MIL/uL (ref 4.22–5.81)
RDW: 13 % (ref 11.5–15.5)
WBC: 12.2 10*3/uL — ABNORMAL HIGH (ref 4.0–10.5)
nRBC: 0 % (ref 0.0–0.2)

## 2023-01-14 LAB — BASIC METABOLIC PANEL
Anion gap: 9 (ref 5–15)
Anion gap: 10 (ref 5–15)
BUN: 22 mg/dL (ref 8–23)
BUN: 16 mg/dL (ref 8–23)
CO2: 22 mmol/L (ref 22–32)
CO2: 22 mmol/L (ref 22–32)
Calcium: 8.1 mg/dL — ABNORMAL LOW (ref 8.9–10.3)
Calcium: 8.7 mg/dL — ABNORMAL LOW (ref 8.9–10.3)
Chloride: 111 mmol/L (ref 98–111)
Chloride: 112 mmol/L — ABNORMAL HIGH (ref 98–111)
Creatinine, Ser: 2.04 mg/dL — ABNORMAL HIGH (ref 0.61–1.24)
Creatinine, Ser: 1.78 mg/dL — ABNORMAL HIGH (ref 0.61–1.24)
GFR, Estimated: 35 mL/min — ABNORMAL LOW (ref >60)
GFR, Estimated: 41 mL/min — ABNORMAL LOW (ref >60)
Glucose, Bld: 131 mg/dL — ABNORMAL HIGH (ref 70–99)
Glucose, Bld: 88 mg/dL (ref 70–99)
Potassium: 2.6 mmol/L — CL (ref 3.5–5.1)
Potassium: 3.9 mmol/L (ref 3.5–5.1)
Sodium: 142 mmol/L (ref 135–145)
Sodium: 144 mmol/L (ref 135–145)

## 2023-01-14 LAB — MAGNESIUM: Magnesium: 2.1 mg/dL (ref 1.7–2.4)

## 2023-01-14 MED ORDER — METRONIDAZOLE 500 MG PO TABS
500.0000 mg | ORAL_TABLET | Freq: Two times a day (BID) | ORAL | Status: DC
  Administered 2023-01-14 – 2023-01-15 (×2): 500 mg via ORAL
  Filled 2023-01-14 (×2): qty 1

## 2023-01-14 MED ORDER — POTASSIUM CHLORIDE 10 MEQ/100ML IV SOLN
10.0000 meq | INTRAVENOUS | Status: AC
  Administered 2023-01-14 (×2): 10 meq via INTRAVENOUS
  Filled 2023-01-14 (×2): qty 100

## 2023-01-14 MED ORDER — MELATONIN 5 MG PO TABS
5.0000 mg | ORAL_TABLET | Freq: Once | ORAL | Status: AC
  Administered 2023-01-14: 5 mg via ORAL
  Filled 2023-01-14: qty 1

## 2023-01-14 MED ORDER — HALOPERIDOL LACTATE 5 MG/ML IJ SOLN
1.0000 mg | Freq: Once | INTRAMUSCULAR | Status: AC
  Administered 2023-01-14: 1 mg via INTRAVENOUS
  Filled 2023-01-14: qty 1

## 2023-01-14 MED ORDER — SODIUM CHLORIDE 0.9 % IV SOLN
2.0000 g | Freq: Two times a day (BID) | INTRAVENOUS | Status: DC
  Administered 2023-01-14 – 2023-01-15 (×2): 2 g via INTRAVENOUS
  Filled 2023-01-14 (×2): qty 12.5

## 2023-01-14 MED ORDER — VANCOMYCIN HCL 1250 MG/250ML IV SOLN
1250.0000 mg | INTRAVENOUS | Status: DC
  Administered 2023-01-14 – 2023-01-15 (×2): 1250 mg via INTRAVENOUS
  Filled 2023-01-14 (×2): qty 250

## 2023-01-14 MED ORDER — SODIUM CHLORIDE 0.9 % IV SOLN: INTRAVENOUS | Status: DC | PRN

## 2023-01-14 MED ORDER — POTASSIUM CHLORIDE CRYS ER 20 MEQ PO TBCR
40.0000 meq | EXTENDED_RELEASE_TABLET | Freq: Two times a day (BID) | ORAL | Status: AC
  Administered 2023-01-14 (×2): 40 meq via ORAL
  Filled 2023-01-14 (×2): qty 2

## 2023-01-14 MED ORDER — FAMOTIDINE 20 MG PO TABS
20.0000 mg | ORAL_TABLET | Freq: Every day | ORAL | Status: DC
  Administered 2023-01-14 – 2023-01-15 (×2): 20 mg via ORAL
  Filled 2023-01-14 (×2): qty 1

## 2023-01-14 NOTE — Progress Notes (Addendum)
Pharmacy Antibiotic Note  Brendan Butler is a 72 y.o. male admitted on 01/12/2023 with sepsis with possible GI source.  Pharmacy has been consulted for vancomycin and cefepime dosing.  Patient is also on metronidazole per team.  ClCr 40 ml/min, Scr/WBC/lactic acid improving. Patient received vancomycin 1250mg  x1 11/16.  11/18 Vancomycin 1250mg  Q 24 hr with Est AUC: 531 Scr used: 2.04 mg/dL; Vd coeff: 0.72 L/kg  Plan: Increase Cefepime 2g IV q12h Vancomycin 1250mg  IV q24hr Monitor cultures, clinical status, renal function, vancomycin level Narrow abx as able, end date 7 days per consult  Height: 5\' 11"  (180.3 cm) Weight: 94.4 kg (208 lb 1.6 oz) IBW/kg (Calculated) : 75.3  Temp (24hrs), Avg:97.7 F (36.5 C), Min:97 F (36.1 C), Max:98.4 F (36.9 C)  Recent Labs  Lab 01/12/23 1703 01/12/23 1906 01/12/23 1953 01/12/23 2154 01/13/23 0248 01/14/23 0449  WBC 36.4*  --   --   --  20.9* 12.2*  CREATININE 3.00*  --   --   --  2.55* 2.04*  LATICACIDVEN  --  2.4* 3.6* 2.1*  --   --     Estimated Creatinine Clearance: 40.1 mL/min (A) (by C-G formula based on SCr of 2.04 mg/dL (H)).    Allergies  Allergen Reactions   Levitra [Vardenafil] Other (See Comments)    Blurred vision   Asa [Aspirin] Hives   Lipitor [Atorvastatin] Other (See Comments)    Myalgias    Antimicrobials: cefe 11/16>>11/22 MTZ 11/16>>  Vanc 11/61>>11/22   Microbiology  11/16 Bcx: ngtd  11/17 GI panel: 11/17 Cdiff:   Alphia Moh, PharmD, BCPS, BCCP Clinical Pharmacist  Please check AMION for all [facility] Pharmacy phone numbers After 10:00 PM, call Main Pharmacy [phone]

## 2023-01-14 NOTE — Progress Notes (Signed)
PROGRESS NOTE  Brendan Butler UJW:[id number] DOB: Aug 04, 1950 DOA: 01/12/2023 PCP: [name], MD   LOS: 2 days   Brief Narrative / Interim history: Brendan Butler is a 72 y.o. male with medical history significant for atrial fibrillation on Xarelto, CAD, chronic HFpEF, depression, anxiety, colon cancer status post hemicolectomy in June 2024, and CKD 3A who presents with epigastric pain, nausea, vomiting, and loose stools over the past week. Found to have profound AKI on his known baseline CKD3a requiring IVF at intake.   Subjective / 24h Interval events: Doing well this morning, no longer has diarrhea  Assesement and Plan: Principal Problem:   Acute renal failure superimposed on stage 3 chronic kidney disease, unspecified acute renal failure type, unspecified whether stage 3a or 3b CKD (HCC) Active Problems:   Coronary artery disease involving native coronary artery of native heart without angina pectoris   Depression with anxiety   OSA on CPAP   Hypokalemia   Persistent atrial fibrillation (HCC)   Lactic acidosis  Principal problem Severe sepsis due to GI upset with nausea, vomiting, diarrhea -has been going on as an outpatient, improving rapidly with antibiotics.  White count up to 36K on admission, down to 12.2 this morning.  No longer has diarrhea and C. difficile and GI pathogen panel were not sent  Active problems Profound hypokalemia-due to GI losses, replace again potassium today  Acute kidney injury on chronic kidney disease stage IIIa -baseline creatinine appears to be around 1.4-1.7, was as high as 3.0, down to 2.0 today.  History of left nephrectomy-remote  PAF-continue amiodarone and Xarelto  Chronic diastolic CHF-appears euvolemic today, continue to hold Lasix  Depression/anxiety-continue medications  Hospital induced delirium-overnight  CAD-no chest discomfort  Scheduled Meds:  amiodarone  100 mg Oral Daily   buPROPion  150 mg Oral Daily    famotidine  20 mg Oral Daily   metroNIDAZOLE  500 mg Oral Q12H   pantoprazole  40 mg Oral Daily   PARoxetine  10 mg Oral Daily   potassium chloride  40 mEq Oral BID   prasugrel  10 mg Oral Daily   pregabalin  75 mg Oral Daily   QUEtiapine  25 mg Oral QHS   Rivaroxaban  15 mg Oral Q supper   rosuvastatin  10 mg Oral Daily   sodium chloride flush  3 mL Intravenous Q12H   Continuous Infusions:  ceFEPime (MAXIPIME) IV 200 mL/hr at 01/14/23 1146   vancomycin 1,250 mg (01/14/23 1241)   PRN Meds:.acetaminophen **OR** acetaminophen, fentaNYL (SUBLIMAZE) injection, oxyCODONE, prochlorperazine  Current Outpatient Medications  Medication Instructions   amiodarone (PACERONE) 200 MG tablet Take 1/2 tablet (100 mg total) by mouth daily.   buPROPion (WELLBUTRIN XL) 150 mg, Oral, Daily   Cholecalciferol (VITAMIN D-3 PO) 1 capsule, Oral, Daily   empagliflozin (JARDIANCE) 10 mg, Oral, Daily   furosemide (LASIX) 40 mg, Oral, Daily   lactose free nutrition (BOOST PLUS) LIQD 237 mLs, Oral, 2 times daily between meals   methocarbamol (ROBAXIN) 500 mg, Oral, Every 8 hours PRN   Multiple Vitamin (MULTIVITAMIN WITH MINERALS) TABS tablet 1 tablet, Oral, Daily   nitroGLYCERIN (NITROSTAT) 0.4 mg, Sublingual, Every 5 min PRN   ondansetron (ZOFRAN) 4 mg, Oral, Every 8 hours PRN   pantoprazole (PROTONIX) 40 mg, Oral, Daily   PARoxetine (PAXIL) 40 mg, Oral, Daily   prasugrel (EFFIENT) 10 mg, Oral, Daily   pregabalin (LYRICA) 150 mg, Oral, 2 times daily   QUEtiapine (SEROQUEL) 25 mg, Oral, Daily at  bedtime   rivaroxaban (XARELTO) 20 mg, Oral, Daily   rosuvastatin (CRESTOR) 20 mg, Oral, Daily   sacubitril-valsartan (ENTRESTO) 24-26 MG 1 tablet, Oral, 2 times daily   spironolactone (ALDACTONE) 12.5 mg, Oral, Daily   Xarelto 15 mg, Oral, Daily with supper    Diet Orders (From admission, onward)     Start     Ordered   01/14/23 0659  Diet regular Fluid consistency: Thin  Diet effective now       Question:   Fluid consistency:  Answer:  Thin   01/14/23 0658            DVT prophylaxis:  Rivaroxaban (XARELTO) tablet 15 mg   Lab Results  Component Value Date   PLT 185 01/14/2023      Code Status: Full Code  Family Communication: no family at bedside   Status is: Inpatient Remains inpatient appropriate because: severity of illness  Level of care: Progressive  Consultants:  none  Objective: Vitals:   01/13/23 2140 01/14/23 0100 01/14/23 0811 01/14/23 1024  BP: 137/81  (!) 144/78 (!) 138/59  Pulse: 96  86 97  Resp: 18  18 20   Temp: 97.7 F (36.5 C) (!) 97 F (36.1 C) 98.4 F (36.9 C) 98.5 F (36.9 C)  TempSrc: Oral  Oral Oral  SpO2: 100%  96% 97%  Weight:      Height:        Intake/Output Summary (Last 24 hours) at 01/14/2023 1325 Last data filed at 01/14/2023 1146 Gross per 24 hour  Intake 1260.6 ml  Output 600 ml  Net 660.6 ml   Wt Readings from Last 3 Encounters:  01/13/23 94.4 kg  10/12/22 88.9 kg  09/29/22 88.6 kg    Examination:  Constitutional: NAD Eyes: no scleral icterus ENMT: Mucous membranes are moist.  Neck: normal, supple Respiratory: clear to auscultation bilaterally, no wheezing, no crackles. Normal respiratory effort.  Cardiovascular: Regular rate and rhythm, no murmurs / rubs / gallops. No LE edema.  Abdomen: non distended, no tenderness. Bowel sounds positive.  Musculoskeletal: no clubbing / cyanosis.   Data Reviewed: I have independently reviewed following labs and imaging studies   CBC Recent Labs  Lab 01/12/23 1703 01/13/23 0248 01/14/23 0449  WBC 36.4* 20.9* 12.2*  HGB 17.2* 15.1 13.5  HCT 51.9 44.5 40.1  PLT 282 206 185  MCV 87.8 86.1 85.9  MCH 29.1 29.2 28.9  MCHC 33.1 33.9 33.7  RDW 13.2 13.1 13.0    Recent Labs  Lab 01/12/23 1703 01/12/23 1859 01/12/23 1906 01/12/23 1953 01/12/23 2154 01/13/23 0248 01/14/23 0449  NA 136  --   --   --   --  140 142  K 2.8*  --   --   --   --  3.0* 2.6*  CL 101  --   --    --   --  105 111  CO2 19*  --   --   --   --  25 22  GLUCOSE 114*  --   --   --   --  76 131*  BUN 43*  --   --   --   --  34* 22  CREATININE 3.00*  --   --   --   --  2.55* 2.04*  CALCIUM 8.0*  --   --   --   --  7.9* 8.1*  AST  --  29  --   --   --  20  --  ALT  --  20  --   --   --  18  --   ALKPHOS  --  74  --   --   --  59  --   BILITOT  --  1.8*  --   --   --  1.6*  --   ALBUMIN  --  3.4*  --   --   --  2.7*  --   MG  --  2.4  --   --   --   --   --   LATICACIDVEN  --   --  2.4* 3.6* 2.1*  --   --     ------------------------------------------------------------------------------------------------------------------ No results for input(s): "CHOL", "HDL", "LDLCALC", "TRIG", "CHOLHDL", "LDLDIRECT" in the last 72 hours.  Lab Results  Component Value Date   HGBA1C 5.6 01/28/2016   ------------------------------------------------------------------------------------------------------------------ No results for input(s): "TSH", "T4TOTAL", "T3FREE", "THYROIDAB" in the last 72 hours.  Invalid input(s): "FREET3"  Cardiac Enzymes No results for input(s): "CKMB", "TROPONINI", "MYOGLOBIN" in the last 168 hours.  Invalid input(s): "CK" ------------------------------------------------------------------------------------------------------------------    Component Value Date/Time   BNP 22.6 09/03/2022 1100   BNP 41.5 08/21/2022 0041    CBG: No results for input(s): "GLUCAP" in the last 168 hours.  Recent Results (from the past 240 hour(s))  Blood culture (routine x 2)     Status: None (Preliminary result)   Collection Time: 01/12/23  9:58 PM   Specimen: BLOOD  Result Value Ref Range Status   Specimen Description BLOOD SITE NOT SPECIFIED  Final   Special Requests   Final    BOTTLES DRAWN AEROBIC AND ANAEROBIC Blood Culture adequate volume   Culture   Final    NO GROWTH 2 DAYS Performed at [facility], [address] [place], [place] [address]    Report Status PENDING   Incomplete  Blood culture (routine x 2)     Status: None (Preliminary result)   Collection Time: 01/12/23 10:03 PM   Specimen: BLOOD  Result Value Ref Range Status   Specimen Description BLOOD SITE NOT SPECIFIED  Final   Special Requests   Final    BOTTLES DRAWN AEROBIC AND ANAEROBIC Blood Culture adequate volume   Culture   Final    NO GROWTH 2 DAYS Performed at [facility], [address] [place], [place] [address]    Report Status PENDING  Incomplete  Culture, blood (single)     Status: None (Preliminary result)   Collection Time: 01/13/23  2:48 AM   Specimen: BLOOD LEFT HAND  Result Value Ref Range Status   Specimen Description BLOOD LEFT HAND  Final   Special Requests BOTTLES DRAWN AEROBIC AND ANAEROBIC  Final   Culture   Final    NO GROWTH 1 DAY Performed at [facility], [address], [place], [place] [address]    Report Status PENDING  Incomplete     Radiology Studies: No results found.   Pamella Pert, MD, PhD [facility]  Between 7 am - 7 pm I am available, please contact me via Amion (for emergencies) or Securechat (non urgent messages)  Between 7 pm - 7 am I am not available, please contact night coverage MD/APP via Amion

## 2023-01-14 NOTE — TOC Progression Note (Signed)
Transition of Care [facility]) - Progression Note    Patient Details  Name: Brendan Butler MRN: [id number] Date of Birth: [birth year]  [facility]) CM/SW Contact  Leone Haven, RN Phone Number: 01/14/2023, 4:23 PM  Clinical Narrative:    From home alone, has PCP and insurance on file, states has no HH services in place at this time , he has all the DME he needs.  States he does not want HH services , feels like it is invasion of his privacy.  States daughters  will transport him  home at [employer] and family is support system, states gets medications from [facility] in [place]. Pta self ambulatory.     Barriers to Discharge: Continued Medical Work up  Expected Discharge Plan and Services       Living arrangements for the past 2 months: Single Family Home                 DME Arranged: Bedside commode, Cane, Walker rolling DME Agency: NA                   Social Determinants of Health (SDOH) Interventions SDOH Screenings   Food Insecurity: No Food Insecurity (01/13/2023)  Housing: Low Risk  (01/13/2023)  Transportation Needs: No Transportation Needs (01/13/2023)  Utilities: Not At Risk (01/13/2023)  Alcohol Screen: Low Risk  (08/20/2022)  Financial Resource Strain: Low Risk  (08/20/2022)  Tobacco Use: High Risk (01/12/2023)    Readmission Risk Interventions    10/10/2022    2:40 PM 09/28/2022    1:19 PM  Readmission Risk Prevention Plan  Transportation Screening Complete Complete  Medication Review (RN Care Manager) Referral to Pharmacy Referral to Pharmacy  PCP or Specialist appointment within 3-5 days of discharge Complete Complete  HRI or Home Care Consult Complete Complete  SW Recovery Care/Counseling Consult Complete Complete  Palliative Care Screening Not Applicable Not Applicable  Skilled Nursing Facility Not Applicable Not Applicable

## 2023-01-14 NOTE — Progress Notes (Signed)
Received a call from bedside RN regarding the patient wanting to leave AGAINST MEDICAL ADVICE.    Presented at bedside the patient is alert and confused.  He thinks he has been in the hospital for more than a week.  He thinks he is at home with [facility] who has already given him his medications.  Suspect delirium due to acute illness.  Attempted to call his daughters, no answers, left HIPAA compliant messages.    The patient's bedside RN, Greggory Stallion, and myself were able to convince him to stay and take his evening medications including his home Seroquel.  1 dose of IV Haldol 1 mg x 1 also administered.  Twelve-lead EKG completed.  No QTc prolongation.    Ordered BMP and magnesium level.  Will replete electrolytes as indicated.  Due to his delirium, the patient lacks decision-making capacity and is unable to make the decision to leave AGAINST MEDICAL ADVICE.  Time: 15 minutes.
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
## 2023-01-14 NOTE — TOC Initial Note (Signed)
Transition of Care [facility]) - Initial/Assessment Note    Patient Details  Name: Brendan Butler MRN: [id number] Date of Birth: 04-18-1950  Transition [facility] Care [facility]) CM/SW Contact:    Michaela Corner, LCSWA Phone Number: 01/14/2023, 12:42 PM  Clinical Narrative:  From home alone, has PCP and insurance on file, has had HH before but unsure of agency name, has the following DME at home: RW, canes, rolator. Pt states his daughters can provide transportation at dc.     Barriers to Discharge: Continued Medical Work up   Patient Goals and CMS Choice Patient states their goals for this hospitalization and ongoing recovery are:: To go home          Expected Discharge Plan and Services       Living arrangements for the past 2 months: Single Family Home                 DME Arranged: Bedside commode, Cane, Walker rolling DME Agency: NA                  Prior Living Arrangements/Services Living arrangements for the past 2 months: Single Family Home Lives with:: Self Patient language and need for interpreter reviewed:: Yes Do you feel safe going back to the place where you live?: Yes      Need for Family Participation in Patient Care: No (Comment) Care giver support system in place?: No (comment) Current home services: DME (RW, canes, rolator) Criminal Activity/Legal Involvement Pertinent to Current Situation/Hospitalization: No - Comment as needed  Activities of Daily Living   ADL Screening (condition at time of admission) Independently performs ADLs?: Yes (appropriate for developmental age) Is the patient deaf or have difficulty hearing?: No Does the patient have difficulty seeing, even when wearing glasses/contacts?: No Does the patient have difficulty concentrating, remembering, or making decisions?: No  Permission Sought/Granted Permission sought to share information with : Family Supports Permission granted to share information with : Yes, Verbal Permission  Granted  Share Information with NAME: Daughters - Scientist, research (medical) and Hospital doctor           Emotional Assessment Appearance:: Appears stated age Attitude/Demeanor/Rapport: Engaged Affect (typically observed): Calm, Happy Orientation: : Oriented to Self, Oriented to Place, Oriented to  Time, Oriented to Situation Alcohol / Substance Use: Not Applicable Psych Involvement: No (comment)  Admission diagnosis:  Sepsis, due to unspecified organism, unspecified whether acute organ dysfunction present (HCC) [A41.9] Acute renal failure superimposed on stage 3 chronic kidney disease, unspecified acute renal failure type, unspecified whether stage 3a or 3b CKD (HCC) [N17.9, N18.30] Patient Active Problem List   Diagnosis Date Noted   Acute renal failure superimposed on stage 3 chronic kidney disease, unspecified acute renal failure type, unspecified whether stage 3a or 3b CKD (HCC) 01/12/2023   Lactic acidosis 01/12/2023   Hyponatremia 10/09/2022   History of colon cancer 10/09/2022   Falls 10/09/2022   Protein-calorie malnutrition, severe 09/28/2022   Acute kidney injury superimposed on chronic kidney disease (HCC) 09/27/2022   Adenocarcinoma, colon (HCC) 09/27/2022   Gastroenteritis 09/11/2022   Nausea vomiting and diarrhea 09/10/2022   Depression with anxiety 09/10/2022   Chronic heart failure with preserved ejection fraction (HFpEF) (HCC) 09/10/2022   Persistent atrial fibrillation (HCC) 08/17/2022   Hypochromic anemia 08/17/2022   Hypercoagulable state due to typical atrial flutter (HCC) 06/14/2022   Falls frequently 05/23/2022   MDD (major depressive disorder), recurrent episode, mild (HCC) 05/10/2022   Delirium due to another medical condition 05/10/2022  Atrial flutter (HCC) 05/06/2022   Hypokalemia 05/06/2022   Pulmonary nodules 12/27/2021   Symptomatic bradycardia 12/25/2021   Preoperative cardiovascular examination    Intra-abdominal abscess (HCC) 01/17/2021   Lung nodule 01/17/2021    Aneurysm of ascending aorta (HCC) 01/17/2021   CKD stage 3b, GFR 30-44 ml/min (HCC) 01/17/2021   Weight loss 01/17/2021   Bronchiectasis (HCC) 01/17/2021   Chronic cholecystitis 01/16/2021   Traumatic hematoma of left popliteal region 01/28/2016   DJD (degenerative joint disease) of pelvis 01/28/2016   Atypical chest pain    Chest pain 01/27/2016   Unstable angina (HCC) 04/21/2013   Leukocytosis 04/21/2013   Subsequent ST elevation (STEMI) myocardial infarction of anterior wall within 4 weeks of initial infarction [facility]) 01/08/2013    Class: Hospitalized for   Illiteracy and low-level literacy 01/08/2013   Aspirin allergy- hives 01/06/2013   STEMI 12/29/12 Rx'd with LAD DES with early stent thrombosis, STEMI-PCI 01/05/14 01/05/2013   Coronary artery disease involving native coronary artery of native heart without angina pectoris 06/29/2007   Dyslipidemia 06/25/2007   Essential hypertension 06/25/2007   ALLERGIC RHINITIS 06/25/2007   OSA on CPAP 06/25/2007   PCP:  [name], MD Pharmacy:   [facility] [address] [place] [address] Phone: [phone] Fax: [phone]     Social Determinants of Health (SDOH) Social History: SDOH Screenings   Food Insecurity: No Food Insecurity (01/13/2023)  Housing: Low Risk  (01/13/2023)  Transportation Needs: No Transportation Needs (01/13/2023)  Utilities: Not At Risk (01/13/2023)  Alcohol Screen: Low Risk  (08/20/2022)  Financial Resource Strain: Low Risk  (08/20/2022)  Tobacco Use: High Risk (01/12/2023)   SDOH Interventions:     Readmission Risk Interventions    10/10/2022    2:40 PM 09/28/2022    1:19 PM  Readmission Risk Prevention Plan  Transportation Screening Complete Complete  Medication Review (RN Care Manager) Referral to Pharmacy Referral to Pharmacy  PCP or Specialist appointment within 3-5 days of discharge Complete Complete  HRI or Home Care Consult Complete Complete  SW  Recovery Care/Counseling Consult Complete Complete  Palliative Care Screening Not Applicable Not Applicable  Skilled Nursing Facility Not Applicable Not Applicable

## 2023-01-14 NOTE — Progress Notes (Signed)
Mobility Specialist Progress Note:    01/14/23 1207  Mobility  Activity Ambulated with assistance in hallway  Level of Assistance Standby assist, set-up cues, supervision of patient - no hands on  Assistive Device None  Distance Ambulated (ft) 350 ft  Activity Response Tolerated well  Mobility Referral Yes  $Mobility charge 1 Mobility  Mobility Specialist Start Time (ACUTE ONLY) 1145  Mobility Specialist Stop Time (ACUTE ONLY) 1200  Mobility Specialist Time Calculation (min) (ACUTE ONLY) 15 min   Received pt in bed having no complaints and agreeable to mobility. Pt was asymptomatic throughout ambulation. Returned to room w/o fault. Left in bed w/ call bell in reach and all needs met.   Platten Grayer Mobility Specialist  Please contact vis Secure Chat or  Rehab Office [phone]

## 2023-01-14 NOTE — Plan of Care (Signed)
  Problem: Fluid Volume: Goal: Hemodynamic stability will improve Outcome: Progressing   Problem: Clinical Measurements: Goal: Diagnostic test results will improve Outcome: Progressing   

## 2023-01-15 ENCOUNTER — Other Ambulatory Visit (HOSPITAL_COMMUNITY): Payer: Self-pay

## 2023-01-15 DIAGNOSIS — N183 Chronic kidney disease, stage 3 unspecified: Secondary | ICD-10-CM | POA: Diagnosis not present

## 2023-01-15 DIAGNOSIS — N179 Acute kidney failure, unspecified: Secondary | ICD-10-CM | POA: Diagnosis not present

## 2023-01-15 LAB — CBC
HCT: 41.5 % (ref 39.0–52.0)
Hemoglobin: 13.8 g/dL (ref 13.0–17.0)
MCH: 29.1 pg (ref 26.0–34.0)
MCHC: 33.3 g/dL (ref 30.0–36.0)
MCV: 87.4 fL (ref 80.0–100.0)
Platelets: 240 10*3/uL (ref 150–400)
RBC: 4.75 MIL/uL (ref 4.22–5.81)
RDW: 13.2 % (ref 11.5–15.5)
WBC: 8.8 10*3/uL (ref 4.0–10.5)
nRBC: 0 % (ref 0.0–0.2)

## 2023-01-15 LAB — BASIC METABOLIC PANEL
Anion gap: 8 (ref 5–15)
BUN: 14 mg/dL (ref 8–23)
CO2: 22 mmol/L (ref 22–32)
Calcium: 8.1 mg/dL — ABNORMAL LOW (ref 8.9–10.3)
Chloride: 110 mmol/L (ref 98–111)
Creatinine, Ser: 1.9 mg/dL — ABNORMAL HIGH (ref 0.61–1.24)
GFR, Estimated: 38 mL/min — ABNORMAL LOW (ref >60)
Glucose, Bld: 124 mg/dL — ABNORMAL HIGH (ref 70–99)
Potassium: 3.2 mmol/L — ABNORMAL LOW (ref 3.5–5.1)
Sodium: 140 mmol/L (ref 135–145)

## 2023-01-15 LAB — MAGNESIUM: Magnesium: 1.8 mg/dL (ref 1.7–2.4)

## 2023-01-15 MED ORDER — RIVAROXABAN 20 MG PO TABS
20.0000 mg | ORAL_TABLET | Freq: Every day | ORAL | Status: DC
  Administered 2023-01-15: 20 mg via ORAL
  Filled 2023-01-15: qty 1

## 2023-01-15 MED ORDER — CIPROFLOXACIN HCL 500 MG PO TABS
500.0000 mg | ORAL_TABLET | Freq: Two times a day (BID) | ORAL | 0 refills | Status: AC
Start: 2023-01-15 — End: 2023-01-17
  Filled 2023-01-15: qty 4, 2d supply, fill #0

## 2023-01-15 NOTE — Plan of Care (Signed)
  Problem: Fluid Volume: Goal: Hemodynamic stability will improve Outcome: Progressing   Problem: Clinical Measurements: Goal: Diagnostic test results will improve Outcome: Progressing   

## 2023-01-15 NOTE — Plan of Care (Signed)

## 2023-01-15 NOTE — TOC Transition Note (Addendum)
Transition of Care [facility]) - CM/SW Discharge Note   Patient Details  Name: Brendan Butler MRN: [id number] Date of Birth: Jul 19, 1950  Transition of Care [facility]) CM/SW Contact:  Leone Haven, RN Phone Number: 01/15/2023, 1:24 PM   Clinical Narrative:    For dc today, has no needs. TOC to fill meds.  Nephew to pick up at 4:30.     Barriers to Discharge: Continued Medical Work up   Patient Goals and CMS Choice      Discharge Placement                         Discharge Plan and Services Additional resources added to the After Visit Summary for                  DME Arranged: Bedside commode, Cane, Walker rolling DME Agency: NA                  Social Determinants of Health (SDOH) Interventions SDOH Screenings   Food Insecurity: No Food Insecurity (01/13/2023)  Housing: Low Risk  (01/13/2023)  Transportation Needs: No Transportation Needs (01/13/2023)  Utilities: Not At Risk (01/13/2023)  Alcohol Screen: Low Risk  (08/20/2022)  Financial Resource Strain: Low Risk  (08/20/2022)  Tobacco Use: High Risk (01/12/2023)     Readmission Risk Interventions    10/10/2022    2:40 PM 09/28/2022    1:19 PM  Readmission Risk Prevention Plan  Transportation Screening Complete Complete  Medication Review (RN Care Manager) Referral to Pharmacy Referral to Pharmacy  PCP or Specialist appointment within 3-5 days of discharge Complete Complete  HRI or Home Care Consult Complete Complete  SW Recovery Care/Counseling Consult Complete Complete  Palliative Care Screening Not Applicable Not Applicable  Skilled Nursing Facility Not Applicable Not Applicable

## 2023-01-15 NOTE — Progress Notes (Signed)
Patient refused AM labs and refused 0400 vital signs

## 2023-01-15 NOTE — Discharge Summary (Signed)
Physician Discharge Summary  SANDEEP OLDS ZOX:[id number] DOB: 1950-10-27 DOA: 01/12/2023  PCP: [name], MD  Admit date: 01/12/2023 Discharge date: 01/15/2023  Admitted From: home Disposition:  home  Recommendations for Outpatient Follow-up:  Follow up with PCP in 1-2 weeks Please obtain BMP/CBC in one week  Home Health: none Equipment/Devices: none  Discharge Condition: stable CODE STATUS: Full code Diet Orders (From admission, onward)     Start     Ordered   01/14/23 0659  Diet regular Fluid consistency: Thin  Diet effective now       Question:  Fluid consistency:  Answer:  Thin   01/14/23 0658            HPI: Per admitting MD,  Brendan Butler is a 72 y.o. male with medical history significant for atrial fibrillation on Xarelto, CAD, chronic HFpEF, depression, anxiety, colon cancer status post hemicolectomy in June 2024, and CKD 3A who presents with epigastric pain, nausea, vomiting, and loose stools. Patient reports developing abdominal pain, nausea, vomiting, and loose stool on 01/06/2023.  He has not had an appetite over this interval. Symptoms have been persistent.  Pain is localized to the mid and upper abdomen.  There is no blood in his vomit or stool.  He reports at least 2 stools daily since time of symptom onset.  He was having chills and sweats last night.  He denies chest pain, cough, shortness of breath, or dysuria.  Hospital Course / Discharge diagnoses: Principal Problem:   Acute renal failure superimposed on stage 3 chronic kidney disease, unspecified acute renal failure type, unspecified whether stage 3a or 3b CKD (HCC) Active Problems:   Coronary artery disease involving native coronary artery of native heart without angina pectoris   Depression with anxiety   OSA on CPAP   Hypokalemia   Persistent atrial fibrillation (HCC)   Lactic acidosis  Principal problem Severe sepsis due to GI upset with nausea, vomiting, diarrhea -patient was  admitted to the hospital with acute diarrheal illness, nausea, vomiting.  He was septic on admission with fever, white count 36,000 and was placed on broad-spectrum antibiotics.  He improved rapidly, fever resolved, white count improved and his nausea, vomiting, diarrhea all resolved and he is now tolerating a regular diet.  He is feeling back to baseline and will be discharged home in stable condition.  C. difficile and GI pathogen panel were ordered, however not sent since his diarrhea resolved   Active problems Profound hypokalemia-due to GI losses, potassium now normalized  Acute kidney injury on chronic kidney disease stage IIIa -baseline creatinine appears to be around 1.4-1.7, was as high as 3.0, creatinine has returned now to baseline.  His home medications/diuretics/Entresto were held, but now he has good po intake, back to baseline and with baseline creatinine function will resume upon discharge.  Recommend follow-up with PCP within 3 to 5 days for repeat blood work History of left nephrectomy-remote PAF-continue amiodarone and Xarelto Chronic diastolic CHF-resume home medications  Depression/anxiety-continue medications Hospital induced delirium-mostly happening at night.  Patient has a history of hospital induced delirium with prior hospitalizations, and apparently returns to his baseline when he is home and functions fairly well.  I have discussed this with the daughter that this may indicate early cognitive impairment.  He has a history of heavy EtOH use but quit 20+ years ago, definitely puts him at risk for this  CAD-no chest discomfort  Discharge Instructions   Allergies as of 01/15/2023  Reactions   Levitra [vardenafil] Other (See Comments)   Blurred vision   Asa [aspirin] Hives   Lipitor [atorvastatin] Other (See Comments)   Myalgias         Medication List     TAKE these medications    amiodarone 200 MG tablet Commonly known as: PACERONE Take 1/2 tablet (100  mg total) by mouth daily.   buPROPion 150 MG 24 hr tablet Commonly known as: WELLBUTRIN XL Take 1 tablet (150 mg total) by mouth daily. What changed: how much to take   CertaVite/Antioxidants Tabs Take 1 tablet by mouth daily.   ciprofloxacin 500 MG tablet Commonly known as: Cipro Take 1 tablet (500 mg total) by mouth 2 (two) times daily for 2 days.   empagliflozin 10 MG Tabs tablet Commonly known as: JARDIANCE Take 1 tablet (10 mg total) by mouth daily.   Entresto 24-26 MG Generic drug: sacubitril-valsartan Take 1 tablet by mouth 2 (two) times daily.   furosemide 40 MG tablet Commonly known as: LASIX Take 1 tablet (40 mg total) by mouth daily.   lactose free nutrition Liqd Take 237 mLs by mouth 2 (two) times daily between meals.   methocarbamol 500 MG tablet Commonly known as: ROBAXIN Take 1 tablet (500 mg total) by mouth every 8 (eight) hours as needed for muscle spasms.   nitroGLYCERIN 0.4 MG SL tablet Commonly known as: NITROSTAT Place 0.4 mg under the tongue every 5 (five) minutes as needed for chest pain.   ondansetron 4 MG tablet Commonly known as: Zofran Take 1 tablet (4 mg total) by mouth every 8 (eight) hours as needed for nausea or vomiting.   pantoprazole 40 MG tablet Commonly known as: PROTONIX Take 1 tablet (40 mg total) by mouth daily.   PARoxetine 40 MG tablet Commonly known as: PAXIL Take 40 mg by mouth daily.   prasugrel 10 MG Tabs tablet Commonly known as: EFFIENT Take 10 mg by mouth daily.   pregabalin 150 MG capsule Commonly known as: LYRICA Take 1 capsule (150 mg total) by mouth 2 (two) times daily. What changed: when to take this   QUEtiapine 25 MG tablet Commonly known as: SEROQUEL Take 1 tablet (25 mg total) by mouth at bedtime.   rivaroxaban 20 MG Tabs tablet Commonly known as: XARELTO Take 20 mg by mouth daily. What changed: Another medication with the same name was removed. Continue taking this medication, and follow the  directions you see here.   rosuvastatin 20 MG tablet Commonly known as: CRESTOR Take 1 tablet (20 mg total) by mouth daily.   spironolactone 25 MG tablet Commonly known as: ALDACTONE Take 12.5 mg by mouth daily.   VITAMIN D-3 PO Take 1 capsule by mouth daily.        Follow-up Information     [name], MD Follow up.   Specialty: Internal Medicine Why: The office will call patient. Contact information: [address] [place] [address] [phone]                 Consultations: none  Procedures/Studies:  CT ABDOMEN PELVIS WO CONTRAST  Result Date: 01/12/2023 CLINICAL DATA:  Abdominal pain, chest pain.  Nausea, vomiting EXAM: CT ABDOMEN AND PELVIS WITHOUT CONTRAST TECHNIQUE: Multidetector CT imaging of the abdomen and pelvis was performed following the standard protocol without IV contrast. RADIATION DOSE REDUCTION: This exam was performed according to the departmental dose-optimization program which includes automated exposure control, adjustment of the mA and/or kV according to patient  size and/or use of iterative reconstruction technique. COMPARISON:  10/09/2022 FINDINGS: Lower chest: Scarring in the right lower lobe posteriorly. No effusions. Hepatobiliary: No focal liver abnormality is seen. Status post cholecystectomy. No biliary dilatation. Pancreas: No focal abnormality or ductal dilatation. Spleen: No focal abnormality.  Normal size. Adrenals/Urinary Tract: Prior left nephrectomy. No renal or adrenal mass. No stones or hydronephrosis on the right. Urinary bladder unremarkable. Stomach/Bowel: Postoperative changes in the right colon. Scattered colonic diverticula. No active diverticulitis. Stomach and small bowel decompressed, unremarkable. Vascular/Lymphatic: Aortic atherosclerosis. No evidence of aneurysm or adenopathy. Reproductive: No visible focal abnormality. Other: No free fluid or free air. Musculoskeletal: No acute bony abnormality.  IMPRESSION: No acute findings in the abdomen or pelvis. Aortic atherosclerosis. Scattered colonic diverticulosis.  No active diverticulitis. Electronically Signed   By: Charlett Nose M.D.   On: 01/12/2023 20:34   DG Chest 2 View  Result Date: 01/12/2023 CLINICAL DATA:  Chest pain, nausea, and vomiting for 2 days. Coronary artery disease. EXAM: CHEST - 2 VIEW COMPARISON:  10/23/2022 FINDINGS: The heart size and mediastinal contours are within normal limits. Both lungs are clear. IMPRESSION: No active cardiopulmonary disease. Electronically Signed   By: Danae Orleans M.D.   On: 01/12/2023 17:54     Subjective: - no chest pain, shortness of breath, no abdominal pain, nausea or vomiting.   Discharge Exam: BP (!) 149/79 (BP Location: Right Arm)   Pulse 79   Temp 98 F (36.7 C) (Oral)   Resp 17   Ht 5\' 11"  (1.803 m)   Wt 94.4 kg   SpO2 96%   BMI 29.02 kg/m   General: Pt is alert, awake, not in acute distress Cardiovascular: RRR, S1/S2 +, no rubs, no gallops Respiratory: CTA bilaterally, no wheezing, no rhonchi Abdominal: Soft, NT, ND, bowel sounds + Extremities: no edema, no cyanosis  The results of significant diagnostics from this hospitalization (including imaging, microbiology, ancillary and laboratory) are listed below for reference.     Microbiology: Recent Results (from the past 240 hour(s))  Blood culture (routine x 2)     Status: None (Preliminary result)   Collection Time: 01/12/23  9:58 PM   Specimen: BLOOD  Result Value Ref Range Status   Specimen Description BLOOD SITE NOT SPECIFIED  Final   Special Requests   Final    BOTTLES DRAWN AEROBIC AND ANAEROBIC Blood Culture adequate volume   Culture   Final    NO GROWTH 3 DAYS Performed at [facility], [address] [place], [place] [address]    Report Status PENDING  Incomplete  Blood culture (routine x 2)     Status: None (Preliminary result)   Collection Time: 01/12/23 10:03 PM   Specimen: BLOOD  Result  Value Ref Range Status   Specimen Description BLOOD SITE NOT SPECIFIED  Final   Special Requests   Final    BOTTLES DRAWN AEROBIC AND ANAEROBIC Blood Culture adequate volume   Culture   Final    NO GROWTH 3 DAYS Performed at [facility], [address], [place], [place] [address]    Report Status PENDING  Incomplete  Culture, blood (single)     Status: None (Preliminary result)   Collection Time: 01/13/23  2:48 AM   Specimen: BLOOD LEFT HAND  Result Value Ref Range Status   Specimen Description BLOOD LEFT HAND  Final   Special Requests BOTTLES DRAWN AEROBIC AND ANAEROBIC  Final   Culture   Final  NO GROWTH 2 DAYS Performed at [facility], [address] [place], [place] [address]    Report Status PENDING  Incomplete     Labs: Basic Metabolic Panel: Recent Labs  Lab 01/12/23 1703 01/12/23 1859 01/13/23 0248 01/14/23 0449 01/14/23 2135  NA 136  --  140 142 144  K 2.8*  --  3.0* 2.6* 3.9  CL 101  --  105 111 112*  CO2 19*  --  25 22 22   GLUCOSE 114*  --  76 131* 88  BUN 43*  --  34* 22 16  CREATININE 3.00*  --  2.55* 2.04* 1.78*  CALCIUM 8.0*  --  7.9* 8.1* 8.7*  MG  --  2.4  --   --  2.1   Liver Function Tests: Recent Labs  Lab 01/12/23 1859 01/13/23 0248  AST 29 20  ALT 20 18  ALKPHOS 74 59  BILITOT 1.8* 1.6*  PROT 6.7 5.4*  ALBUMIN 3.4* 2.7*   CBC: Recent Labs  Lab 01/12/23 1703 01/13/23 0248 01/14/23 0449  WBC 36.4* 20.9* 12.2*  HGB 17.2* 15.1 13.5  HCT 51.9 44.5 40.1  MCV 87.8 86.1 85.9  PLT 282 206 185   CBG: No results for input(s): "GLUCAP" in the last 168 hours. Hgb A1c No results for input(s): "HGBA1C" in the last 72 hours. Lipid Profile No results for input(s): "CHOL", "HDL", "LDLCALC", "TRIG", "CHOLHDL", "LDLDIRECT" in the last 72 hours. Thyroid function studies No results for input(s): "TSH", "T4TOTAL", "T3FREE", "THYROIDAB" in the last 72 hours.  Invalid input(s): "FREET3" Urinalysis    Component Value Date/Time    COLORURINE YELLOW 01/12/2023 2138   APPEARANCEUR CLEAR 01/12/2023 2138   LABSPEC 1.008 01/12/2023 2138   PHURINE 5.0 01/12/2023 2138   GLUCOSEU >=500 (A) 01/12/2023 2138   HGBUR SMALL (A) 01/12/2023 2138   BILIRUBINUR NEGATIVE 01/12/2023 2138   KETONESUR NEGATIVE 01/12/2023 2138   PROTEINUR 30 (A) 01/12/2023 2138   NITRITE NEGATIVE 01/12/2023 2138   LEUKOCYTESUR TRACE (A) 01/12/2023 2138    FURTHER DISCHARGE INSTRUCTIONS:   Get Medicines reviewed and adjusted: Please take all your medications with you for your next visit with your Primary MD   Laboratory/radiological data: Please request your Primary MD to go over all hospital tests and procedure/radiological results at the follow up, please ask your Primary MD to get all Hospital records sent to his/her office.   In some cases, they will be blood work, cultures and biopsy results pending at the time of your discharge. Please request that your primary care M.D. goes through all the records of your hospital data and follows up on these results.   Also Note the following: If you experience worsening of your admission symptoms, develop shortness of breath, life threatening emergency, suicidal or homicidal thoughts you must seek medical attention immediately by calling 911 or calling your MD immediately  if symptoms less severe.   You must read complete instructions/literature along with all the possible adverse reactions/side effects for all the Medicines you take and that have been prescribed to you. Take any new Medicines after you have completely understood and accpet all the possible adverse reactions/side effects.    Do not drive when taking Pain medications or sleeping medications (Benzodaizepines)   Do not take more than prescribed Pain, Sleep and Anxiety Medications. It is not advisable to combine anxiety,sleep and pain medications without talking with your primary care practitioner   Special Instructions: If you have smoked  or chewed Tobacco  in the last 2 yrs please stop smoking, stop any regular Alcohol  and or any Recreational drug use.   Wear Seat belts while driving.   Please note: You were cared for by a hospitalist during your hospital stay. Once you are discharged, your primary care physician will handle any further medical issues. Please note that NO REFILLS for any discharge medications will be authorized once you are discharged, as it is imperative that you return to your primary care physician (or establish a relationship with a primary care physician if you do not have one) for your post hospital discharge needs so that they can reassess your need for medications and monitor your lab values.  Time coordinating discharge: 40 minutes  SIGNED:  Pamella Pert, MD, PhD 01/15/2023, 1:12 PM

## 2023-01-15 NOTE — Progress Notes (Signed)
Mobility Specialist Progress Note:    01/15/23 1219  Mobility  Activity Ambulated with assistance in room;Transferred from bed to chair  Level of Assistance Contact guard assist, steadying assist  Assistive Device None  Distance Ambulated (ft) 5 ft  Activity Response Tolerated well  Mobility Referral Yes  $Mobility charge 1 Mobility  Mobility Specialist Start Time (ACUTE ONLY) 1153  Mobility Specialist Stop Time (ACUTE ONLY) 1203  Mobility Specialist Time Calculation (min) (ACUTE ONLY) 10 min   Pt received in bed hesitant but agreeable to mobility. Declined ambulating in hall d/t "not feeling good" unable to state what was wrong or what hurt. Agreed to go to the chair. Call bell and personal belongings in reach w/ chair alarm on. All needs met.   Lipa Grayer Mobility Specialist  Please contact vis Secure Chat or  Rehab Office [phone]

## 2023-01-16 NOTE — Telephone Encounter (Signed)
Telephone call  

## 2023-01-17 LAB — CULTURE, BLOOD (ROUTINE X 2)
Culture: NO GROWTH
Culture: NO GROWTH
Special Requests: ADEQUATE
Special Requests: ADEQUATE

## 2023-01-18 LAB — CULTURE, BLOOD (SINGLE): Culture: NO GROWTH

## 2023-01-23 DIAGNOSIS — I719 Aortic aneurysm of unspecified site, without rupture: Secondary | ICD-10-CM | POA: Diagnosis not present

## 2023-01-23 DIAGNOSIS — I10 Essential (primary) hypertension: Secondary | ICD-10-CM | POA: Diagnosis not present

## 2023-01-23 DIAGNOSIS — D6869 Other thrombophilia: Secondary | ICD-10-CM | POA: Diagnosis not present

## 2023-01-23 DIAGNOSIS — I480 Paroxysmal atrial fibrillation: Secondary | ICD-10-CM | POA: Diagnosis not present

## 2023-01-23 DIAGNOSIS — N1832 Chronic kidney disease, stage 3b: Secondary | ICD-10-CM | POA: Diagnosis not present

## 2023-01-23 DIAGNOSIS — I5032 Chronic diastolic (congestive) heart failure: Secondary | ICD-10-CM | POA: Diagnosis not present

## 2023-01-23 DIAGNOSIS — I130 Hypertensive heart and chronic kidney disease with heart failure and stage 1 through stage 4 chronic kidney disease, or unspecified chronic kidney disease: Secondary | ICD-10-CM | POA: Diagnosis not present

## 2023-01-23 DIAGNOSIS — N179 Acute kidney failure, unspecified: Secondary | ICD-10-CM | POA: Diagnosis not present

## 2023-01-23 DIAGNOSIS — I700 Atherosclerosis of aorta: Secondary | ICD-10-CM | POA: Diagnosis not present

## 2023-02-10 ENCOUNTER — Other Ambulatory Visit: Payer: Self-pay

## 2023-02-10 ENCOUNTER — Encounter (HOSPITAL_COMMUNITY): Payer: Self-pay | Admitting: *Deleted

## 2023-02-10 ENCOUNTER — Emergency Department (HOSPITAL_COMMUNITY)
Admission: EM | Admit: 2023-02-10 | Discharge: 2023-02-11 | Disposition: A | Discharge status: Home / Self Care | Payer: Medicare HMO | Attending: Emergency Medicine | Admitting: Emergency Medicine

## 2023-02-10 DIAGNOSIS — I2510 Atherosclerotic heart disease of native coronary artery without angina pectoris: Secondary | ICD-10-CM | POA: Diagnosis not present

## 2023-02-10 DIAGNOSIS — Z85038 Personal history of other malignant neoplasm of large intestine: Secondary | ICD-10-CM | POA: Diagnosis not present

## 2023-02-10 DIAGNOSIS — I509 Heart failure, unspecified: Secondary | ICD-10-CM | POA: Diagnosis not present

## 2023-02-10 DIAGNOSIS — Z79899 Other long term (current) drug therapy: Secondary | ICD-10-CM | POA: Diagnosis not present

## 2023-02-10 DIAGNOSIS — N183 Chronic kidney disease, stage 3 unspecified: Secondary | ICD-10-CM | POA: Diagnosis not present

## 2023-02-10 DIAGNOSIS — I130 Hypertensive heart and chronic kidney disease with heart failure and stage 1 through stage 4 chronic kidney disease, or unspecified chronic kidney disease: Secondary | ICD-10-CM | POA: Diagnosis not present

## 2023-02-10 DIAGNOSIS — R112 Nausea with vomiting, unspecified: Secondary | ICD-10-CM | POA: Insufficient documentation

## 2023-02-10 DIAGNOSIS — Z7901 Long term (current) use of anticoagulants: Secondary | ICD-10-CM | POA: Diagnosis not present

## 2023-02-10 LAB — URINALYSIS, ROUTINE W REFLEX MICROSCOPIC
Bilirubin Urine: NEGATIVE
Glucose, UA: >=500 mg/dL — AB
Ketones, ur: 5 mg/dL — AB
Leukocytes,Ua: NEGATIVE
Nitrite: NEGATIVE
Protein, ur: >=300 mg/dL — AB
Specific Gravity, Urine: 1.035 — ABNORMAL HIGH (ref 1.005–1.030)
pH: 5 (ref 5.0–8.0)

## 2023-02-10 LAB — COMPREHENSIVE METABOLIC PANEL
ALT: 26 U/L (ref 0–44)
AST: 30 U/L (ref 15–41)
Albumin: 3.8 g/dL (ref 3.5–5.0)
Alkaline Phosphatase: 95 U/L (ref 38–126)
Anion gap: 15 (ref 5–15)
BUN: 14 mg/dL (ref 8–23)
CO2: 21 mmol/L — ABNORMAL LOW (ref 22–32)
Calcium: 9.2 mg/dL (ref 8.9–10.3)
Chloride: 104 mmol/L (ref 98–111)
Creatinine, Ser: 1.29 mg/dL — ABNORMAL HIGH (ref 0.61–1.24)
GFR, Estimated: >60 mL/min (ref >60)
Glucose, Bld: 129 mg/dL — ABNORMAL HIGH (ref 70–99)
Potassium: 3.4 mmol/L — ABNORMAL LOW (ref 3.5–5.1)
Sodium: 140 mmol/L (ref 135–145)
Total Bilirubin: 2 mg/dL — ABNORMAL HIGH (ref <1.2)
Total Protein: 7.2 g/dL (ref 6.5–8.1)

## 2023-02-10 LAB — CBC
HCT: 45.8 % (ref 39.0–52.0)
Hemoglobin: 15.5 g/dL (ref 13.0–17.0)
MCH: 29.4 pg (ref 26.0–34.0)
MCHC: 33.8 g/dL (ref 30.0–36.0)
MCV: 86.7 fL (ref 80.0–100.0)
Platelets: 200 10*3/uL (ref 150–400)
RBC: 5.28 MIL/uL (ref 4.22–5.81)
RDW: 13.8 % (ref 11.5–15.5)
WBC: 14.9 10*3/uL — ABNORMAL HIGH (ref 4.0–10.5)
nRBC: 0 % (ref 0.0–0.2)

## 2023-02-10 LAB — LIPASE, BLOOD: Lipase: 30 U/L (ref 11–51)

## 2023-02-10 NOTE — ED Triage Notes (Signed)
The pt reports that he has been vomiting today  he has aphasia probably normal

## 2023-02-11 NOTE — ED Provider Notes (Signed)
[place] EMERGENCY DEPARTMENT AT [facility] Provider Note   CSN: [id number] Arrival date & time: 02/10/23  2210     History  Chief Complaint  Patient presents with   Emesis    Brendan Butler is a 72 y.o. male.  The history is provided by the patient and a relative.  Patient w/history of CAD, chronic kidney disease, heart failure presents with vomiting.  Patient reports over the past day he has had several episodes of nonbloody emesis.  No diarrhea.  No abdominal pain.  No fevers.  He reports he had a normal bowel movement without blood or melena earlier in the day.  He called his nephew who brought him to the ER.  He was able to take his home medications prior to leaving the home No chest pain or shortness of breath    Past Medical History:  Diagnosis Date   Basal ganglia infarction (HCC)    Bradycardia    CAD (coronary artery disease)    Chronic HFrEF (heart failure with reduced ejection fraction) (HCC)    Chronic kidney disease, stage 3 (HCC)    Colon cancer (HCC) 08/26/2022   Erectile dysfunction    Hyperlipidemia LDL goal <70    Hypertension    OSA (obstructive sleep apnea)    No CPAP use overnight   PAF (paroxysmal atrial fibrillation) (HCC)    Paroxysmal atrial flutter (HCC)    Premature atrial contractions    Pulmonary nodules     Home Medications Prior to Admission medications   Medication Sig Start Date End Date Taking? Authorizing Provider  amiodarone (PACERONE) 200 MG tablet Take 1/2 tablet (100 mg total) by mouth daily. 08/29/22   Swayze, Ava, DO  buPROPion (WELLBUTRIN XL) 150 MG 24 hr tablet Take 1 tablet (150 mg total) by mouth daily. Patient taking differently: Take 450 mg by mouth daily. 05/24/22   Elgergawy, Leana Roe, MD  Cholecalciferol (VITAMIN D-3 PO) Take 1 capsule by mouth daily.    [provider]  empagliflozin (JARDIANCE) 10 MG TABS tablet Take 1 tablet (10 mg total) by mouth daily. 06/18/22   Corrin Parker, PA-C   furosemide (LASIX) 40 MG tablet Take 1 tablet (40 mg total) by mouth daily. 10/23/22   Durwin Glaze, MD  lactose free nutrition (BOOST PLUS) LIQD Take 237 mLs by mouth 2 (two) times daily between meals. 08/29/22   Swayze, Ava, DO  methocarbamol (ROBAXIN) 500 MG tablet Take 1 tablet (500 mg total) by mouth every 8 (eight) hours as needed for muscle spasms. 08/28/22   Swayze, Ava, DO  Multiple Vitamin (MULTIVITAMIN WITH MINERALS) TABS tablet Take 1 tablet by mouth daily. 08/29/22   Swayze, Ava, DO  nitroGLYCERIN (NITROSTAT) 0.4 MG SL tablet Place 0.4 mg under the tongue every 5 (five) minutes as needed for chest pain. 01/08/23   [provider]  ondansetron (ZOFRAN) 4 MG tablet Take 1 tablet (4 mg total) by mouth every 8 (eight) hours as needed for nausea or vomiting. 09/13/22 09/13/23  Rai, Ripudeep K, MD  pantoprazole (PROTONIX) 40 MG tablet Take 1 tablet (40 mg total) by mouth daily. 05/11/22   Pokhrel, Rebekah Chesterfield, MD  PARoxetine (PAXIL) 40 MG tablet Take 40 mg by mouth daily.    [provider]  prasugrel (EFFIENT) 10 MG TABS tablet Take 10 mg by mouth daily. 09/12/22   [provider]  pregabalin (LYRICA) 150 MG capsule Take 1 capsule (150 mg total) by mouth 2 (two) times daily.  Patient taking differently: Take 150 mg by mouth 3 (three) times daily. 05/24/22   Elgergawy, Leana Roe, MD  QUEtiapine (SEROQUEL) 25 MG tablet Take 1 tablet (25 mg total) by mouth at bedtime. 05/10/22   Pokhrel, Rebekah Chesterfield, MD  rivaroxaban (XARELTO) 20 MG TABS tablet Take 20 mg by mouth daily.    [provider]  rosuvastatin (CRESTOR) 20 MG tablet Take 1 tablet (20 mg total) by mouth daily. 06/18/22   Marjie Skiff E, PA-C  sacubitril-valsartan (ENTRESTO) 24-26 MG Take 1 tablet by mouth 2 (two) times daily.    [provider]  spironolactone (ALDACTONE) 25 MG tablet Take 12.5 mg by mouth daily.    [provider]      Allergies    Levitra [vardenafil], Asa [aspirin], and Lipitor  [atorvastatin]    Review of Systems   Review of Systems  Constitutional:  Negative for fever.  Cardiovascular:  Negative for chest pain.  Gastrointestinal:  Positive for vomiting. Negative for blood in stool.  Genitourinary:  Negative for scrotal swelling and testicular pain.    Physical Exam Updated Vital Signs BP (!) 205/77 (BP Location: Right Arm)   Pulse 81   Temp 98.1 F (36.7 C) (Oral)   Resp 14   Ht 1.803 m (5\' 11" )   Wt 94.4 kg   SpO2 99%   BMI 29.03 kg/m  Physical Exam CONSTITUTIONAL: Elderly, no acute distress HEAD: Normocephalic/atraumatic EYES: EOMI/PERRL, no icterus ENMT: Mucous membranes moist NECK: supple no meningeal signs CV: S1/S2 noted, no murmurs/rubs/gallops noted LUNGS: Lungs are clear to auscultation bilaterally, no apparent distress ABDOMEN: soft, nontender, no rebound or guarding, bowel sounds noted throughout abdomen Well-healed midline scar noted GU:no cva tenderness NEURO: Pt is awake/alert/appropriate, moves all extremitiesx4.  No facial droop.  There is no aphasia or dysarthria EXTREMITIES: pulses normal/equal, full ROM SKIN: warm, color normal PSYCH: no abnormalities of mood noted, alert and oriented to situation  ED Results / Procedures / Treatments   Labs (all labs ordered are listed, but only abnormal results are displayed) Labs Reviewed  COMPREHENSIVE METABOLIC PANEL - Abnormal; Notable for the following components:      Result Value   Potassium 3.4 (*)    CO2 21 (*)    Glucose, Bld 129 (*)    Creatinine, Ser 1.29 (*)    Total Bilirubin 2.0 (*)    All other components within normal limits  CBC - Abnormal; Notable for the following components:   WBC 14.9 (*)    All other components within normal limits  URINALYSIS, ROUTINE W REFLEX MICROSCOPIC - Abnormal; Notable for the following components:   Specific Gravity, Urine 1.035 (*)    Glucose, UA >=500 (*)    Hgb urine dipstick SMALL (*)    Ketones, ur 5 (*)    Protein, ur >=300  (*)    Bacteria, UA RARE (*)    All other components within normal limits  LIPASE, BLOOD    EKG EKG Interpretation Date/Time:  Monday February 11 2023 00:29:08 EST Ventricular Rate:  63 PR Interval:  164 QRS Duration:  111 QT Interval:  443 QTC Calculation: 454 R Axis:   -4  Text Interpretation: Sinus rhythm Atrial premature complexes Incomplete left bundle branch block Confirmed by Zadie Rhine ([id number]) on 02/11/2023 12:39:08 AM  Radiology No results found.  Procedures Procedures    Medications Ordered in ED Medications - No data to display  ED Course/ Medical Decision Making/ A&P  Medical Decision Making Amount and/or Complexity of Data Reviewed ECG/medicine tests: ordered.   This patient presents to the ED for concern of vomiting, this involves an extensive number of treatment options, and is a complaint that carries with it a high risk of complications and morbidity.  The differential diagnosis includes but is not limited to  pancreatitis, gastritis, peptic ulcer disease, appendicitis, bowel obstruction, bowel perforation, diverticulitis, AAA, ischemic bowel Acute coronary syndrome  Comorbidities that complicate the patient evaluation: Patient's presentation is complicated by their history of CAD, A-fib on anticoagulation, heart failure  Social Determinants of Health: Patient's  living alone   increases the complexity of managing their presentation  Additional history obtained: Additional history obtained from family Records reviewed previous admission documents  Lab Tests: I Ordered, and personally interpreted labs.  The pertinent results include: Leukocytosis   Test Considered: Advanced CT imaging considered, but patient is now feeling improved and tolerating p.o. will defer at this time   Reevaluation: After the interventions noted above, I reevaluated the patient and found that they have :improved  Complexity of  problems addressed: Patient's presentation is most consistent with  acute presentation with potential threat to life or bodily function  Disposition: After consideration of the diagnostic results and the patient's response to treatment,  I feel that the patent would benefit from discharge   .    Patient with complicated history including previous cholecystectomy, colon cancer status post hemicolectomy presents with vomiting.  He has not vomited in the ER.  He has no focal abdominal tenderness.  No vomiting here and he drank water without difficulty.  BP is elevated though no signs of hypertensive emergency Patient feels improved and would like to be discharged home.  Discussed at length strict return precautions with patient and his nephew        Final Clinical Impression(s) / ED Diagnoses Final diagnoses:  Nausea and vomiting, unspecified vomiting type    Rx / DC Orders ED Discharge Orders     None         Zadie Rhine, MD 02/11/23 0129

## 2023-02-11 NOTE — Discharge Instructions (Signed)
°  SEEK IMMEDIATE MEDICAL ATTENTION IF: °The pain does not go away or becomes severe, particularly over the next 8-12 hours.  °A temperature above 100.4F develops.  °Repeated vomiting occurs (multiple episodes).  ° °Blood is being passed in stools or vomit (bright red or black tarry stools).  °Return also if you develop chest pain, difficulty breathing, dizziness or fainting, or become confused, poorly responsive, or inconsolable. ° °

## 2023-04-03 ENCOUNTER — Other Ambulatory Visit: Payer: Self-pay | Admitting: Student

## 2023-06-02 ENCOUNTER — Other Ambulatory Visit: Payer: Self-pay | Admitting: Student

## 2023-07-25 ENCOUNTER — Emergency Department

## 2023-07-25 ENCOUNTER — Observation Stay
Admission: EM | Admit: 2023-07-25 | Discharge: 2023-07-26 | Disposition: A | Discharge status: Home / Self Care | Attending: Osteopathic Medicine | Admitting: Osteopathic Medicine

## 2023-07-25 ENCOUNTER — Other Ambulatory Visit: Payer: Self-pay

## 2023-07-25 DIAGNOSIS — E785 Hyperlipidemia, unspecified: Secondary | ICD-10-CM | POA: Diagnosis not present

## 2023-07-25 DIAGNOSIS — I5032 Chronic diastolic (congestive) heart failure: Secondary | ICD-10-CM | POA: Diagnosis not present

## 2023-07-25 DIAGNOSIS — R7303 Prediabetes: Secondary | ICD-10-CM | POA: Diagnosis not present

## 2023-07-25 DIAGNOSIS — Z9049 Acquired absence of other specified parts of digestive tract: Secondary | ICD-10-CM | POA: Insufficient documentation

## 2023-07-25 DIAGNOSIS — I10 Essential (primary) hypertension: Secondary | ICD-10-CM | POA: Diagnosis present

## 2023-07-25 DIAGNOSIS — Z87891 Personal history of nicotine dependence: Secondary | ICD-10-CM | POA: Diagnosis not present

## 2023-07-25 DIAGNOSIS — Z85038 Personal history of other malignant neoplasm of large intestine: Secondary | ICD-10-CM | POA: Diagnosis not present

## 2023-07-25 DIAGNOSIS — D72829 Elevated white blood cell count, unspecified: Secondary | ICD-10-CM | POA: Diagnosis not present

## 2023-07-25 DIAGNOSIS — Z79899 Other long term (current) drug therapy: Secondary | ICD-10-CM | POA: Diagnosis not present

## 2023-07-25 DIAGNOSIS — I5A Non-ischemic myocardial injury (non-traumatic): Secondary | ICD-10-CM | POA: Diagnosis not present

## 2023-07-25 DIAGNOSIS — K5730 Diverticulosis of large intestine without perforation or abscess without bleeding: Secondary | ICD-10-CM | POA: Diagnosis not present

## 2023-07-25 DIAGNOSIS — E876 Hypokalemia: Secondary | ICD-10-CM | POA: Insufficient documentation

## 2023-07-25 DIAGNOSIS — E872 Acidosis, unspecified: Secondary | ICD-10-CM | POA: Insufficient documentation

## 2023-07-25 DIAGNOSIS — N179 Acute kidney failure, unspecified: Secondary | ICD-10-CM | POA: Diagnosis not present

## 2023-07-25 DIAGNOSIS — I130 Hypertensive heart and chronic kidney disease with heart failure and stage 1 through stage 4 chronic kidney disease, or unspecified chronic kidney disease: Secondary | ICD-10-CM | POA: Diagnosis not present

## 2023-07-25 DIAGNOSIS — K449 Diaphragmatic hernia without obstruction or gangrene: Secondary | ICD-10-CM | POA: Diagnosis not present

## 2023-07-25 DIAGNOSIS — I4819 Other persistent atrial fibrillation: Secondary | ICD-10-CM | POA: Insufficient documentation

## 2023-07-25 DIAGNOSIS — Z7901 Long term (current) use of anticoagulants: Secondary | ICD-10-CM | POA: Diagnosis not present

## 2023-07-25 DIAGNOSIS — C189 Malignant neoplasm of colon, unspecified: Secondary | ICD-10-CM | POA: Diagnosis not present

## 2023-07-25 DIAGNOSIS — R109 Unspecified abdominal pain: Secondary | ICD-10-CM | POA: Diagnosis not present

## 2023-07-25 DIAGNOSIS — R112 Nausea with vomiting, unspecified: Secondary | ICD-10-CM | POA: Diagnosis present

## 2023-07-25 DIAGNOSIS — Z7984 Long term (current) use of oral hypoglycemic drugs: Secondary | ICD-10-CM | POA: Insufficient documentation

## 2023-07-25 DIAGNOSIS — R197 Diarrhea, unspecified: Secondary | ICD-10-CM | POA: Diagnosis not present

## 2023-07-25 DIAGNOSIS — Z955 Presence of coronary angioplasty implant and graft: Secondary | ICD-10-CM | POA: Insufficient documentation

## 2023-07-25 DIAGNOSIS — I2510 Atherosclerotic heart disease of native coronary artery without angina pectoris: Secondary | ICD-10-CM | POA: Insufficient documentation

## 2023-07-25 DIAGNOSIS — N1832 Chronic kidney disease, stage 3b: Secondary | ICD-10-CM | POA: Insufficient documentation

## 2023-07-25 DIAGNOSIS — F418 Other specified anxiety disorders: Secondary | ICD-10-CM | POA: Diagnosis not present

## 2023-07-25 DIAGNOSIS — Z905 Acquired absence of kidney: Secondary | ICD-10-CM | POA: Diagnosis not present

## 2023-07-25 LAB — COMPREHENSIVE METABOLIC PANEL WITH GFR
ALT: 18 U/L (ref 0–44)
AST: 25 U/L (ref 15–41)
Albumin: 3.8 g/dL (ref 3.5–5.0)
Alkaline Phosphatase: 88 U/L (ref 38–126)
Anion gap: 16 — ABNORMAL HIGH (ref 5–15)
BUN: 24 mg/dL — ABNORMAL HIGH (ref 8–23)
CO2: 20 mmol/L — ABNORMAL LOW (ref 22–32)
Calcium: 9.1 mg/dL (ref 8.9–10.3)
Chloride: 102 mmol/L (ref 98–111)
Creatinine, Ser: 1.95 mg/dL — ABNORMAL HIGH (ref 0.61–1.24)
GFR, Estimated: 36 mL/min — ABNORMAL LOW (ref >60)
Glucose, Bld: 200 mg/dL — ABNORMAL HIGH (ref 70–99)
Potassium: 2.8 mmol/L — ABNORMAL LOW (ref 3.5–5.1)
Sodium: 138 mmol/L (ref 135–145)
Total Bilirubin: 1.7 mg/dL — ABNORMAL HIGH (ref 0.0–1.2)
Total Protein: 7.8 g/dL (ref 6.5–8.1)

## 2023-07-25 LAB — TROPONIN I (HIGH SENSITIVITY)
Troponin I (High Sensitivity): 23 ng/L — ABNORMAL HIGH (ref <18)
Troponin I (High Sensitivity): 20 ng/L — ABNORMAL HIGH (ref <18)
Troponin I (High Sensitivity): 22 ng/L — ABNORMAL HIGH (ref <18)

## 2023-07-25 LAB — CBC
HCT: 49.7 % (ref 39.0–52.0)
Hemoglobin: 17.4 g/dL — ABNORMAL HIGH (ref 13.0–17.0)
MCH: 29.2 pg (ref 26.0–34.0)
MCHC: 35 g/dL (ref 30.0–36.0)
MCV: 83.4 fL (ref 80.0–100.0)
Platelets: 488 10*3/uL — ABNORMAL HIGH (ref 150–400)
RBC: 5.96 MIL/uL — ABNORMAL HIGH (ref 4.22–5.81)
RDW: 12.8 % (ref 11.5–15.5)
WBC: 21.4 10*3/uL — ABNORMAL HIGH (ref 4.0–10.5)
nRBC: 0 % (ref 0.0–0.2)

## 2023-07-25 LAB — LACTIC ACID, PLASMA
Lactic Acid, Venous: 2.6 mmol/L (ref 0.5–1.9)
Lactic Acid, Venous: 5.1 mmol/L (ref 0.5–1.9)
Lactic Acid, Venous: 2.2 mmol/L (ref 0.5–1.9)

## 2023-07-25 LAB — PHOSPHORUS: Phosphorus: 2.7 mg/dL (ref 2.5–4.6)

## 2023-07-25 LAB — MAGNESIUM: Magnesium: 2.2 mg/dL (ref 1.7–2.4)

## 2023-07-25 LAB — GLUCOSE, CAPILLARY: Glucose-Capillary: 149 mg/dL — ABNORMAL HIGH (ref 70–99)

## 2023-07-25 LAB — BRAIN NATRIURETIC PEPTIDE: B Natriuretic Peptide: 86.3 pg/mL (ref 0.0–100.0)

## 2023-07-25 LAB — LIPASE, BLOOD: Lipase: 42 U/L (ref 11–51)

## 2023-07-25 MED ORDER — SODIUM CHLORIDE 0.9 % IV SOLN: INTRAVENOUS | Status: DC

## 2023-07-25 MED ORDER — METRONIDAZOLE 500 MG/100ML IV SOLN
500.0000 mg | Freq: Two times a day (BID) | INTRAVENOUS | Status: DC
  Administered 2023-07-25 – 2023-07-26 (×2): 500 mg via INTRAVENOUS
  Filled 2023-07-25 (×2): qty 100

## 2023-07-25 MED ORDER — QUETIAPINE FUMARATE 25 MG PO TABS
25.0000 mg | ORAL_TABLET | Freq: Every day | ORAL | Status: DC
  Administered 2023-07-25: 25 mg via ORAL
  Filled 2023-07-25: qty 1

## 2023-07-25 MED ORDER — RIVAROXABAN 20 MG PO TABS
20.0000 mg | ORAL_TABLET | Freq: Every day | ORAL | Status: DC
  Administered 2023-07-26: 20 mg via ORAL
  Filled 2023-07-25: qty 1

## 2023-07-25 MED ORDER — ONDANSETRON HCL 4 MG/2ML IJ SOLN
4.0000 mg | Freq: Once | INTRAMUSCULAR | Status: AC
  Administered 2023-07-25: 4 mg via INTRAVENOUS
  Filled 2023-07-25: qty 2

## 2023-07-25 MED ORDER — SODIUM CHLORIDE 0.9 % IV BOLUS
1000.0000 mL | Freq: Once | INTRAVENOUS | Status: AC
  Administered 2023-07-25: 1000 mL via INTRAVENOUS

## 2023-07-25 MED ORDER — PREGABALIN 75 MG PO CAPS
150.0000 mg | ORAL_CAPSULE | Freq: Three times a day (TID) | ORAL | Status: DC
  Administered 2023-07-25 – 2023-07-26 (×2): 150 mg via ORAL
  Filled 2023-07-25 (×2): qty 2

## 2023-07-25 MED ORDER — ADULT MULTIVITAMIN W/MINERALS CH
1.0000 | ORAL_TABLET | Freq: Every day | ORAL | Status: DC
  Administered 2023-07-26: 1 via ORAL
  Filled 2023-07-25: qty 1

## 2023-07-25 MED ORDER — IOHEXOL 300 MG/ML  SOLN
80.0000 mL | Freq: Once | INTRAMUSCULAR | Status: AC | PRN
  Administered 2023-07-25: 80 mL via INTRAVENOUS

## 2023-07-25 MED ORDER — VANCOMYCIN HCL IN DEXTROSE 1-5 GM/200ML-% IV SOLN
1000.0000 mg | Freq: Once | INTRAVENOUS | Status: AC
  Administered 2023-07-25: 1000 mg via INTRAVENOUS
  Filled 2023-07-25 (×2): qty 200

## 2023-07-25 MED ORDER — OXYCODONE-ACETAMINOPHEN 5-325 MG PO TABS: 1.0000 | ORAL_TABLET | ORAL | Status: DC | PRN

## 2023-07-25 MED ORDER — PRASUGREL HCL 10 MG PO TABS
10.0000 mg | ORAL_TABLET | Freq: Every day | ORAL | Status: DC
  Administered 2023-07-26: 10 mg via ORAL
  Filled 2023-07-25: qty 1

## 2023-07-25 MED ORDER — SODIUM CHLORIDE 0.9 % IV BOLUS
500.0000 mL | Freq: Once | INTRAVENOUS | Status: AC
  Administered 2023-07-25: 500 mL via INTRAVENOUS

## 2023-07-25 MED ORDER — POTASSIUM CHLORIDE 10 MEQ/100ML IV SOLN
10.0000 meq | INTRAVENOUS | Status: AC
  Administered 2023-07-25 (×2): 10 meq via INTRAVENOUS
  Filled 2023-07-25: qty 100

## 2023-07-25 MED ORDER — INSULIN ASPART 100 UNIT/ML IJ SOLN: 0.0000 [IU] | Freq: Every day | INTRAMUSCULAR | Status: DC

## 2023-07-25 MED ORDER — FLUTICASONE PROPIONATE 50 MCG/ACT NA SUSP
2.0000 | Freq: Every day | NASAL | Status: DC
  Administered 2023-07-26: 2 via NASAL
  Filled 2023-07-25: qty 16

## 2023-07-25 MED ORDER — INSULIN ASPART 100 UNIT/ML IJ SOLN: 0.0000 [IU] | Freq: Three times a day (TID) | INTRAMUSCULAR | Status: DC

## 2023-07-25 MED ORDER — HYDRALAZINE HCL 20 MG/ML IJ SOLN: 5.0000 mg | INTRAMUSCULAR | Status: DC | PRN

## 2023-07-25 MED ORDER — MORPHINE SULFATE (PF) 2 MG/ML IV SOLN: 2.0000 mg | INTRAVENOUS | Status: DC | PRN

## 2023-07-25 MED ORDER — ACETAMINOPHEN 325 MG PO TABS: 650.0000 mg | ORAL_TABLET | Freq: Four times a day (QID) | ORAL | Status: DC | PRN

## 2023-07-25 MED ORDER — POTASSIUM CHLORIDE CRYS ER 20 MEQ PO TBCR
40.0000 meq | EXTENDED_RELEASE_TABLET | Freq: Once | ORAL | Status: AC
  Administered 2023-07-25: 40 meq via ORAL
  Filled 2023-07-25: qty 2

## 2023-07-25 MED ORDER — SODIUM CHLORIDE 0.9 % IV SOLN
2.0000 g | Freq: Once | INTRAVENOUS | Status: AC
  Administered 2023-07-25: 2 g via INTRAVENOUS
  Filled 2023-07-25: qty 12.5

## 2023-07-25 MED ORDER — ONDANSETRON HCL 4 MG/2ML IJ SOLN: 4.0000 mg | Freq: Three times a day (TID) | INTRAMUSCULAR | Status: DC | PRN

## 2023-07-25 MED ORDER — BUPROPION HCL ER (XL) 150 MG PO TB24
450.0000 mg | ORAL_TABLET | Freq: Every day | ORAL | Status: DC
  Administered 2023-07-26: 450 mg via ORAL
  Filled 2023-07-25: qty 3

## 2023-07-25 MED ORDER — SODIUM CHLORIDE 0.9 % IV SOLN
2.0000 g | INTRAVENOUS | Status: DC
  Administered 2023-07-26: 2 g via INTRAVENOUS
  Filled 2023-07-25: qty 20

## 2023-07-25 MED ORDER — AMIODARONE HCL 200 MG PO TABS
200.0000 mg | ORAL_TABLET | Freq: Every day | ORAL | Status: DC
  Administered 2023-07-25 – 2023-07-26 (×2): 200 mg via ORAL
  Filled 2023-07-25 (×2): qty 1

## 2023-07-25 MED ORDER — PAROXETINE HCL 20 MG PO TABS
40.0000 mg | ORAL_TABLET | Freq: Every day | ORAL | Status: DC
  Administered 2023-07-26: 40 mg via ORAL
  Filled 2023-07-25: qty 2

## 2023-07-25 MED ORDER — NITROGLYCERIN 0.4 MG SL SUBL: 0.4000 mg | SUBLINGUAL_TABLET | SUBLINGUAL | Status: DC | PRN

## 2023-07-25 MED ORDER — ROSUVASTATIN CALCIUM 20 MG PO TABS
20.0000 mg | ORAL_TABLET | Freq: Every day | ORAL | Status: DC
  Administered 2023-07-26: 20 mg via ORAL
  Filled 2023-07-25: qty 1

## 2023-07-25 NOTE — ED Notes (Signed)
 Pt given oral swabs

## 2023-07-25 NOTE — ED Notes (Signed)
 Pt to CT

## 2023-07-25 NOTE — H&P (Signed)
 History and Physical    Brendan Butler VWU:[id number] DOB: [birth year] DOA: 07/25/2023  Referring MD/NP/PA:   PCP: Jearldine Mina, MD   Patient coming from:  The patient is coming from home.     Chief Complaint: Nausea, vomiting, diarrhea, abdominal pain  HPI: Brendan Butler is a 73 y.o. male with medical history significant of hypertension, hyperlipidemia, diabetes mellitus, CAD, STEMI, diastolic CHF, depression with anxiety, CKD-3B, A-fib on Xarelto , colon cancer (s/p of right colectomy), intra-abdominal abscess, s/p of left nephrectomy, who presents with nausea, vomiting, diarrhea and abdominal pain.  Per patient and her daughter at the bedside, patient has intermittent nausea and vomiting for most of 1 week.  He also has mild middle abdominal pain which is intermittent, aching, nonradiating.  He has 1 episode of diarrhea today.  No fever or chills.  Patient has mild SOB, and mild dry cough, no chest pain.  Denies symptoms of UTI.  Data reviewed independently and ED Course: pt was found to have WBC 25.4, lactic acid of 2.6 --> 5.1, troponin 23, potassium 2.8, magnesium  2.2, phosphorus of 2.7, worsening renal function, liver function (ALP 88, AST 25, ALT 18, total improving 1.7).  Temperature normal, blood pressure 158/95, heart rate 99 --> 86, RR 23 --> 12, oxygen  saturation 100% on room air.  CT of abdomen/pelvis is negative for acute intracranial abnormality.  CT abdomen/pelvis: No acute findings.   Stable postop changes from previous right colectomy and left nephrectomy. No evidence of recurrent or metastatic carcinoma.   Colonic diverticulosis, without radiographic evidence of diverticulitis.   Small hiatal hernia.    EKG: I have personally reviewed.  A-fib, QTc 477, heart rate 130, poor R wave progression.   Review of Systems:   General: no fevers, chills, no body weight gain, has fatigue HEENT: no blurry vision, hearing changes or sore throat Respiratory: has mild  dyspnea, coughing, no wheezing CV: no chest pain, no palpitations GI: has nausea, vomiting, abdominal pain, diarrhea, no constipation GU: no dysuria, burning on urination, increased urinary frequency, hematuria  Ext: no leg edema Neuro: no unilateral weakness, numbness, or tingling, no vision change or hearing loss Skin: no rash, no skin tear. MSK: No muscle spasm, no deformity, no limitation of range of movement in spin Heme: No easy bruising.  Travel history: No recent long distant travel.   Allergy:  Allergies  Allergen Reactions   Levitra [Vardenafil] Other (See Comments)    Blurred vision   Joice Nares ] Hives   Lipitor  [Atorvastatin ] Other (See Comments)    Myalgias     Past Medical History:  Diagnosis Date   Basal ganglia infarction (HCC)    Bradycardia    CAD (coronary artery disease)    Chronic HFrEF (heart failure with reduced ejection fraction) (HCC)    Chronic kidney disease, stage 3 (HCC)    Colon cancer (HCC) 08/26/2022   Erectile dysfunction    Hyperlipidemia LDL goal <70    Hypertension    OSA (obstructive sleep apnea)    No CPAP use overnight   PAF (paroxysmal atrial fibrillation) (HCC)    Paroxysmal atrial flutter (HCC)    Premature atrial contractions    Pulmonary nodules     Past Surgical History:  Procedure Laterality Date   BACK SURGERY  2007   Decompression lumbar laminectomy and micro discectomy, L4-5   BIOPSY  08/20/2022   Procedure: BIOPSY;  Surgeon: Ozell Blunt, MD;  Location: [facility] ENDOSCOPY;  Service: Gastroenterology;;   BRONCHIAL BIOPSY  12/28/2021  Procedure: BRONCHIAL BIOPSIES;  Surgeon: Denson Flake, MD;  Location: [facility] ENDOSCOPY;  Service: Pulmonary;;   BRONCHIAL BRUSHINGS  12/28/2021   Procedure: BRONCHIAL BRUSHINGS;  Surgeon: Denson Flake, MD;  Location: [facility] ENDOSCOPY;  Service: Pulmonary;;   BRONCHIAL NEEDLE ASPIRATION BIOPSY  12/28/2021   Procedure: BRONCHIAL NEEDLE ASPIRATION BIOPSIES;  Surgeon: Denson Flake, MD;  Location:  [facility] ENDOSCOPY;  Service: Pulmonary;;   BRONCHIAL WASHINGS  12/28/2021   Procedure: BRONCHIAL WASHINGS;  Surgeon: Denson Flake, MD;  Location: [facility] ENDOSCOPY;  Service: Pulmonary;;   CARDIAC CATHETERIZATION  2007   patent stent to LAD and normal Cors   CARDIOVERSION N/A 05/10/2022   Procedure: CARDIOVERSION;  Surgeon: Harrold Lincoln, MD;  Location: [facility] ENDOSCOPY;  Service: Cardiovascular;  Laterality: N/A;   CARDIOVERSION N/A 05/28/2022   Procedure: CARDIOVERSION;  Surgeon: Loyde Rule, MD;  Location: [facility] ENDOSCOPY;  Service: Cardiovascular;  Laterality: N/A;   CHOLECYSTECTOMY N/A 12/26/2021   Procedure: LAPAROSCOPIC CHOLECYSTECTOMY, LAPAROSCOPIC LYSIS OF ADHESIONS GREATER THAN 1 HOUR;  Surgeon: Dorena Gander, MD;  Location: [facility] OR;  Service: General;  Laterality: N/A;   COLONOSCOPY WITH PROPOFOL  N/A 08/20/2022   Procedure: COLONOSCOPY WITH PROPOFOL ;  Surgeon: Ozell Blunt, MD;  Location: [facility] ENDOSCOPY;  Service: Gastroenterology;  Laterality: N/A;   CORONARY ANGIOPLASTY WITH STENT PLACEMENT  2001   to LAD   CORONARY ANGIOPLASTY WITH STENT PLACEMENT  12/29/12   STEMI with promus DES to LAD   CORONARY ANGIOPLASTY WITH STENT PLACEMENT  01/05/13   STEMI with thrombosis in stent to LAD   ESOPHAGOGASTRODUODENOSCOPY (EGD) WITH PROPOFOL  N/A 08/20/2022   Procedure: ESOPHAGOGASTRODUODENOSCOPY (EGD) WITH PROPOFOL ;  Surgeon: Ozell Blunt, MD;  Location: [facility] ENDOSCOPY;  Service: Gastroenterology;  Laterality: N/A;   FIDUCIAL MARKER PLACEMENT  12/28/2021   Procedure: FIDUCIAL MARKER PLACEMENT;  Surgeon: Denson Flake, MD;  Location: [facility] ENDOSCOPY;  Service: Pulmonary;;   LAPAROSCOPIC PARTIAL COLECTOMY Right 08/23/2022   Procedure: LAPAROSCOPIC CONVERTED TO OPEN HEMICOLECTOMY;  Surgeon: Shela Derby, MD;  Location: [facility] OR;  Service: General;  Laterality: Right;   LEFT HEART CATH N/A 12/29/2012   Procedure: LEFT HEART CATH;  Surgeon: Lucendia Rusk, MD;  Location: [facility] CATH LAB;  Service:  Cardiovascular;  Laterality: N/A;   LEFT HEART CATHETERIZATION WITH CORONARY ANGIOGRAM N/A 01/05/2013   Procedure: LEFT HEART CATHETERIZATION WITH CORONARY ANGIOGRAM;  Surgeon: Arlander Bellman, MD;  Location: [facility] CATH LAB;  Service: Cardiovascular;  Laterality: N/A;   LEFT HEART CATHETERIZATION WITH CORONARY ANGIOGRAM N/A 04/22/2013   Procedure: LEFT HEART CATHETERIZATION WITH CORONARY ANGIOGRAM;  Surgeon: Millicent Ally, MD;  Location: [facility] CATH LAB;  Service: Cardiovascular;  Laterality: N/A;   PARTIAL NEPHRECTOMY Left 1975   PATIENT ONLY HAS ONE KIDNEY   PERCUTANEOUS CORONARY STENT INTERVENTION (PCI-S)  12/29/2012   Procedure: PERCUTANEOUS CORONARY STENT INTERVENTION (PCI-S);  Surgeon: Lucendia Rusk, MD;  Location: [facility] CATH LAB;  Service: Cardiovascular;;   SUBMUCOSAL TATTOO INJECTION  08/20/2022   Procedure: SUBMUCOSAL TATTOO INJECTION;  Surgeon: Ozell Blunt, MD;  Location: [facility] ENDOSCOPY;  Service: Gastroenterology;;   TEE WITHOUT CARDIOVERSION N/A 05/10/2022   Procedure: TRANSESOPHAGEAL ECHOCARDIOGRAM (TEE);  Surgeon: Harrold Lincoln, MD;  Location: [facility] ENDOSCOPY;  Service: Cardiovascular;  Laterality: N/A;   TEE WITHOUT CARDIOVERSION N/A 05/28/2022   Procedure: TRANSESOPHAGEAL ECHOCARDIOGRAM (TEE);  Surgeon: Loyde Rule, MD;  Location: [facility] ENDOSCOPY;  Service: Cardiovascular;  Laterality: N/A;    Social History:  reports that he quit smoking about 24 years ago. His smoking  use included cigarettes. He started smoking about 49 years ago. He has a 50 pack-year smoking history. His smokeless tobacco use includes chew. He reports that he does not currently use alcohol. He reports current drug use. Drug: Marijuana.  Family History:  Family History  Problem Relation Age of Onset   Alzheimer's disease Mother    CAD Father      Prior to Admission medications   Medication Sig Start Date End Date Taking? Authorizing Provider  amiodarone  (PACERONE ) 200 MG tablet TAKE 1 TABLET EVERY DAY  06/04/23   Loyde Rule, MD  buPROPion  (WELLBUTRIN  XL) 150 MG 24 hr tablet Take 1 tablet (150 mg total) by mouth daily. Patient taking differently: Take 450 mg by mouth daily. 05/24/22   Elgergawy, Ardia Kraft, MD  Cholecalciferol (VITAMIN D-3 PO) Take 1 capsule by mouth daily.    [provider]  empagliflozin  (JARDIANCE ) 10 MG TABS tablet Take 1 tablet (10 mg total) by mouth daily. 06/18/22   Goodrich, Callie E, PA-C  furosemide  (LASIX ) 40 MG tablet Take 1 tablet (40 mg total) by mouth daily. 10/23/22   Carin Charleston, MD  lactose free nutrition (BOOST PLUS) LIQD Take 237 mLs by mouth 2 (two) times daily between meals. 08/29/22   Swayze, Ava, DO  methocarbamol  (ROBAXIN ) 500 MG tablet Take 1 tablet (500 mg total) by mouth every 8 (eight) hours as needed for muscle spasms. 08/28/22   Swayze, Ava, DO  Multiple Vitamin (MULTIVITAMIN WITH MINERALS) TABS tablet Take 1 tablet by mouth daily. 08/29/22   Swayze, Ava, DO  nitroGLYCERIN  (NITROSTAT ) 0.4 MG SL tablet Place 0.4 mg under the tongue every 5 (five) minutes as needed for chest pain. 01/08/23   [provider]  ondansetron  (ZOFRAN ) 4 MG tablet Take 1 tablet (4 mg total) by mouth every 8 (eight) hours as needed for nausea or vomiting. 09/13/22 09/13/23  Rai, Hurman Maiden, MD  pantoprazole  (PROTONIX ) 40 MG tablet Take 1 tablet (40 mg total) by mouth daily. 05/11/22   Pokhrel, Laxman, MD  PARoxetine  (PAXIL ) 40 MG tablet Take 40 mg by mouth daily.    [provider]  prasugrel  (EFFIENT ) 10 MG TABS tablet Take 10 mg by mouth daily. 09/12/22   [provider]  pregabalin  (LYRICA ) 150 MG capsule Take 1 capsule (150 mg total) by mouth 2 (two) times daily. Patient taking differently: Take 150 mg by mouth 3 (three) times daily. 05/24/22   Elgergawy, Ardia Kraft, MD  QUEtiapine  (SEROQUEL ) 25 MG tablet Take 1 tablet (25 mg total) by mouth at bedtime. 05/10/22   Pokhrel, Laxman, MD  rivaroxaban  (XARELTO ) 20 MG TABS tablet Take 20 mg by mouth daily.     [provider]  rosuvastatin  (CRESTOR ) 20 MG tablet TAKE 1 TABLET EVERY DAY 04/04/23   Nishan, Peter C, MD  sacubitril -valsartan  (ENTRESTO ) 24-26 MG TAKE 1 TABLET TWICE DAILY 06/04/23   Nishan, Peter C, MD  spironolactone  (ALDACTONE ) 25 MG tablet Take 12.5 mg by mouth daily.    [provider]    Physical Exam: Vitals:   07/25/23 1945 07/25/23 1953 07/25/23 2000 07/25/23 2125  BP:   (!) 145/94 122/68  Pulse: 86  84 77  Resp: 12  18 17   Temp:  97.9 F (36.6 C)    TempSrc:  Oral    SpO2: 100%  100% 96%   General: Not in acute distress.  Dry mucous membrane HEENT:       Eyes: PERRL, EOMI, no jaundice  ENT: No discharge from the ears and nose, no pharynx injection, no tonsillar enlargement.        Neck: No JVD, no bruit, no mass felt. Heme: No neck lymph node enlargement. Cardiac: S1/S2, irregularly irregular rhythm,, No gallops or rubs. Respiratory: No rales, wheezing, rhonchi or rubs. GI: Soft, nondistended, has mild central abdominal tenderness, no rebound pain, no organomegaly, BS present. GU: No hematuria Ext: No pitting leg edema bilaterally. 1+DP/PT pulse bilaterally. Musculoskeletal: No joint deformities, No joint redness or warmth, no limitation of ROM in spin. Skin: No rashes.  Neuro: Alert, oriented X3, cranial nerves II-XII grossly intact, moves all extremities normally. Psych: Patient is not psychotic, no suicidal or hemocidal ideation.  Labs on Admission: I have personally reviewed following labs and imaging studies  CBC: Recent Labs  Lab 07/25/23 1235  WBC 21.4*  HGB 17.4*  HCT 49.7  MCV 83.4  PLT 488*   Basic Metabolic Panel: Recent Labs  Lab 07/25/23 1235  NA 138  K 2.8*  CL 102  CO2 20*  GLUCOSE 200*  BUN 24*  CREATININE 1.95*  CALCIUM  9.1  MG 2.2  PHOS 2.7   GFR: CrCl cannot be calculated (Unknown ideal weight.). Liver Function Tests: Recent Labs  Lab 07/25/23 1235  AST 25  ALT 18  ALKPHOS 88  BILITOT 1.7*   PROT 7.8  ALBUMIN  3.8   Recent Labs  Lab 07/25/23 1235  LIPASE 42   No results for input(s): "AMMONIA" in the last 168 hours. Coagulation Profile: No results for input(s): "INR", "PROTIME" in the last 168 hours. Cardiac Enzymes: No results for input(s): "CKTOTAL", "CKMB", "CKMBINDEX", "TROPONINI" in the last 168 hours. BNP (last 3 results) No results for input(s): "PROBNP" in the last 8760 hours. HbA1C: No results for input(s): "HGBA1C" in the last 72 hours. CBG: Recent Labs  Lab 07/25/23 2123  GLUCAP 149*   Lipid Profile: No results for input(s): "CHOL", "HDL", "LDLCALC", "TRIG", "CHOLHDL", "LDLDIRECT" in the last 72 hours. Thyroid  Function Tests: No results for input(s): "TSH", "T4TOTAL", "FREET4", "T3FREE", "THYROIDAB" in the last 72 hours. Anemia Panel: No results for input(s): "VITAMINB12", "FOLATE", "FERRITIN", "TIBC", "IRON", "RETICCTPCT" in the last 72 hours. Urine analysis:    Component Value Date/Time   COLORURINE YELLOW 02/10/2023 2239   APPEARANCEUR CLEAR 02/10/2023 2239   LABSPEC 1.035 (H) 02/10/2023 2239   PHURINE 5.0 02/10/2023 2239   GLUCOSEU >=500 (A) 02/10/2023 2239   HGBUR SMALL (A) 02/10/2023 2239   BILIRUBINUR NEGATIVE 02/10/2023 2239   KETONESUR 5 (A) 02/10/2023 2239   PROTEINUR >=300 (A) 02/10/2023 2239   NITRITE NEGATIVE 02/10/2023 2239   LEUKOCYTESUR NEGATIVE 02/10/2023 2239   Sepsis Labs: @LABRCNTIP (procalcitonin:4,lacticidven:4) )No results found for this or any previous visit (from the past 240 hours).   Radiological Exams on Admission:   Assessment/Plan Principal Problem:   Abdominal pain Active Problems:   Nausea vomiting and diarrhea   Acute renal failure superimposed on stage 3b chronic kidney disease (HCC)   Lactic acidosis   CAD (coronary artery disease)   Myocardial injury   Persistent atrial fibrillation (HCC)   Chronic heart failure with preserved ejection fraction (HFpEF) (HCC)   Essential hypertension    Hypokalemia   Prediabetes   Dyslipidemia   Depression with anxiety   Assessment and Plan:  Principal Problem:   Abdominal pain Active Problems:   Nausea vomiting and diarrhea   Acute renal failure superimposed on stage 3b chronic kidney disease (HCC)   Lactic acidosis   CAD (coronary artery  disease)   Myocardial injury   Persistent atrial fibrillation (HCC)   Chronic heart failure with preserved ejection fraction (HFpEF) (HCC)   Essential hypertension   Hypokalemia   Prediabetes   Dyslipidemia   Depression with anxiety       DVT ppx: on Xarelto   Code Status: Full code   Family Communication:   Yes, patient's daughter   at bed side.       Disposition Plan:  Anticipate discharge back to previous environment  Consults called: None  Admission status and Level of care: Telemetry Medical:    for obs     Dispo: The patient is from: Home              Anticipated d/c is to: Home              Anticipated d/c date is: 1 day              Patient currently is not medically stable to d/c.    Severity of Illness:  The appropriate patient status for this patient is OBSERVATION. Observation status is judged to be reasonable and necessary in order to provide the required intensity of service to ensure the patient's safety. The patient's presenting symptoms, physical exam findings, and initial radiographic and laboratory data in the context of their medical condition is felt to place them at decreased risk for further clinical deterioration. Furthermore, it is anticipated that the patient will be medically stable for discharge from the hospital within 2 midnights of admission.        Date of Service 07/25/2023    Fidencio Hue [facility]   If 7PM-7AM, please contact night-coverage [email or web address] 07/25/2023, 9:42 PM

## 2023-07-25 NOTE — ED Triage Notes (Signed)
 Patient states N/V/D and abdominal pain since last night.

## 2023-07-25 NOTE — ED Notes (Signed)
 Lab called to obtains second set of cultures

## 2023-07-25 NOTE — ED Provider Notes (Signed)
 [facility] Provider Note    Event Date/Time   First MD Initiated Contact with Patient 07/25/23 1458     (approximate)   History   Emesis   HPI  Brendan Butler is a 73 y.o. male  who presents to the emergency department today because of concern for nausea and abdominal pain. Patient started having issues last night. Had abdominal pain in the center of his abdomen, continues today but is not severe. He has tried drinking liquids today with minimal success. Had one episode of loose stool earlier today. Has not been feeling well for a couple of weeks. Has history of Afib and apparently has not been consistent in taking his medication. The patient denies any chest pain or palpitations.       Physical Exam   Triage Vital Signs: ED Triage Vitals  Encounter Vitals Group     BP 07/25/23 1234 (!) 147/120     Systolic BP Percentile --      Diastolic BP Percentile --      Pulse Rate 07/25/23 1232 74     Resp 07/25/23 1232 18     Temp 07/25/23 1232 98.2 F (36.8 C)     Temp Source 07/25/23 1232 Oral     SpO2 07/25/23 1232 100 %     Weight --      Height --      Head Circumference --      Peak Flow --      Pain Score 07/25/23 1231 2     Pain Loc --      Pain Education --      Exclude from Growth Chart --     Most recent vital signs: Vitals:   07/25/23 1234 07/25/23 1235  BP: (!) 147/120 (!) 147/121  Pulse:  68  Resp:  17  Temp:  98.2 F (36.8 C)  SpO2:     General: Awake, alert, oriented. CV:  Good peripheral perfusion. Tachycardia, irregular rhythm. Resp:  Normal effort. Lungs clear. Abd:  No distention. Non tender. No distention. Other:  No lower extremity edema.    ED Results / Procedures / Treatments   Labs (all labs ordered are listed, but only abnormal results are displayed) Labs Reviewed  COMPREHENSIVE METABOLIC PANEL WITH GFR - Abnormal; Notable for the following components:      Result Value   Potassium 2.8 (*)    CO2 20 (*)     Glucose, Bld 200 (*)    BUN 24 (*)    Creatinine, Ser 1.95 (*)    Total Bilirubin 1.7 (*)    GFR, Estimated 36 (*)    Anion gap 16 (*)    All other components within normal limits  CBC - Abnormal; Notable for the following components:   WBC 21.4 (*)    RBC 5.96 (*)    Hemoglobin 17.4 (*)    Platelets 488 (*)    All other components within normal limits  LACTIC ACID, PLASMA - Abnormal; Notable for the following components:   Lactic Acid, Venous 2.6 (*)    All other components within normal limits  LACTIC ACID, PLASMA - Abnormal; Notable for the following components:   Lactic Acid, Venous 5.1 (*)    All other components within normal limits  TROPONIN I (HIGH SENSITIVITY) - Abnormal; Notable for the following components:   Troponin I (High Sensitivity) 23 (*)    All other components within normal limits  CULTURE, BLOOD (ROUTINE X 2)  CULTURE, BLOOD (ROUTINE X 2)  LIPASE, BLOOD  URINALYSIS, ROUTINE W REFLEX MICROSCOPIC  MAGNESIUM   PHOSPHORUS  BRAIN NATRIURETIC PEPTIDE     EKG  I, Marylynn Soho, attending physician, personally viewed and interpreted this EKG  EKG Time: 1235 Rate: 130 Rhythm: atrial fibrillation RVR Axis: normal Intervals: qtc 479 QRS: narrow ST changes: no st elevation Impression: abnormal ekg   RADIOLOGY I independently interpreted and visualized the CT abd/pel. My interpretation: Enteritis, no free air Radiology interpretation: IMPRESSION:  No acute findings.   Stable postop changes from previous right colectomy and left  nephrectomy. No evidence of recurrent or metastatic carcinoma.   Colonic diverticulosis, without radiographic evidence of  diverticulitis.   Small hiatal hernia.     PROCEDURES:  Critical Care performed: Yes  CRITICAL CARE Performed by: Marylynn Soho   Total critical care time: 30 minutes  Critical care time was exclusive of separately billable procedures and treating other patients.  Critical care was  necessary to treat or prevent imminent or life-threatening deterioration.  Critical care was time spent personally by me on the following activities: development of treatment plan with patient and/or surrogate as well as nursing, discussions with consultants, evaluation of patient's response to treatment, examination of patient, obtaining history from patient or surrogate, ordering and performing treatments and interventions, ordering and review of laboratory studies, ordering and review of radiographic studies, pulse oximetry and re-evaluation of patient's condition.   Procedures    MEDICATIONS ORDERED IN ED: Medications - No data to display   IMPRESSION / MDM / ASSESSMENT AND PLAN / ED COURSE  I reviewed the triage vital signs and the nursing notes.                              Differential diagnosis includes, but is not limited to, viral gastroenteritis, intraabdominal infection, SBO  Patient's presentation is most consistent with acute presentation with potential threat to life or bodily function.   The patient is on the cardiac monitor to evaluate for evidence of arrhythmia and/or significant heart rate changes.  Patient presented to the emergency department today because of concerns for some nausea vomiting abdominal pain 1 episode of loose stool.  Initial exam patient's heart rate was tachycardic and irregular.  EKG did show A-fib with RVR.  Patient does have a history of A-fib.  Initial blood work concerning for significant leukocytosis.  Given abdominal complaints did obtain a CT abdomen pelvis.  This did not show any acute abnormality.  Patient was given IV fluids.  Lactic did come back elevated.  This time unclear etiology of elevated lactic and leukocytosis.  Will start broad-spectrum antibiotics.  Discussed with Dr. Rosalea Collin with the hospitalist service who will evaluate for admission.      FINAL CLINICAL IMPRESSION(S) / ED DIAGNOSES   Final diagnoses:  Nausea and vomiting,  unspecified vomiting type  AKI (acute kidney injury) (HCC)  Lactic acidosis  Leukocytosis, unspecified type        Rx / DC Orders    Note:  This document was prepared using Dragon voice recognition software and may include unintentional dictation errors.    Marylynn Soho, MD 07/25/23 2029

## 2023-07-26 ENCOUNTER — Other Ambulatory Visit: Payer: Self-pay

## 2023-07-26 DIAGNOSIS — R109 Unspecified abdominal pain: Secondary | ICD-10-CM | POA: Diagnosis not present

## 2023-07-26 LAB — URINALYSIS, ROUTINE W REFLEX MICROSCOPIC
Bacteria, UA: NONE SEEN
Bilirubin Urine: NEGATIVE
Glucose, UA: >=500 mg/dL — AB
Hgb urine dipstick: NEGATIVE
Ketones, ur: NEGATIVE mg/dL
Leukocytes,Ua: NEGATIVE
Nitrite: NEGATIVE
Protein, ur: 30 mg/dL — AB
Specific Gravity, Urine: 1.035 — ABNORMAL HIGH (ref 1.005–1.030)
Squamous Epithelial / HPF: 0 /HPF (ref 0–5)
pH: 5 (ref 5.0–8.0)

## 2023-07-26 LAB — CBC
HCT: 39.2 % (ref 39.0–52.0)
Hemoglobin: 13.7 g/dL (ref 13.0–17.0)
MCH: 29.7 pg (ref 26.0–34.0)
MCHC: 34.9 g/dL (ref 30.0–36.0)
MCV: 84.8 fL (ref 80.0–100.0)
Platelets: 328 10*3/uL (ref 150–400)
RBC: 4.62 MIL/uL (ref 4.22–5.81)
RDW: 13.1 % (ref 11.5–15.5)
WBC: 18.4 10*3/uL — ABNORMAL HIGH (ref 4.0–10.5)
nRBC: 0 % (ref 0.0–0.2)

## 2023-07-26 LAB — BASIC METABOLIC PANEL WITH GFR
Anion gap: 9 (ref 5–15)
BUN: 22 mg/dL (ref 8–23)
CO2: 23 mmol/L (ref 22–32)
Calcium: 8.1 mg/dL — ABNORMAL LOW (ref 8.9–10.3)
Chloride: 105 mmol/L (ref 98–111)
Creatinine, Ser: 1.94 mg/dL — ABNORMAL HIGH (ref 0.61–1.24)
GFR, Estimated: 37 mL/min — ABNORMAL LOW (ref >60)
Glucose, Bld: 99 mg/dL (ref 70–99)
Potassium: 3 mmol/L — ABNORMAL LOW (ref 3.5–5.1)
Sodium: 137 mmol/L (ref 135–145)

## 2023-07-26 LAB — GLUCOSE, CAPILLARY
Glucose-Capillary: 95 mg/dL (ref 70–99)
Glucose-Capillary: 102 mg/dL — ABNORMAL HIGH (ref 70–99)

## 2023-07-26 LAB — HIV ANTIBODY (ROUTINE TESTING W REFLEX): HIV Screen 4th Generation wRfx: NONREACTIVE

## 2023-07-26 LAB — LACTIC ACID, PLASMA
Lactic Acid, Venous: 1.3 mmol/L (ref 0.5–1.9)
Lactic Acid, Venous: 1.3 mmol/L (ref 0.5–1.9)

## 2023-07-26 MED ORDER — RIVAROXABAN 15 MG PO TABS: 15.0000 mg | ORAL_TABLET | Freq: Every day | ORAL | Status: DC

## 2023-07-26 MED ORDER — FUROSEMIDE 40 MG PO TABS: 40.0000 mg | ORAL_TABLET | Freq: Every day | ORAL | Status: DC | PRN

## 2023-07-26 MED ORDER — POTASSIUM CHLORIDE 10 MEQ/100ML IV SOLN
10.0000 meq | INTRAVENOUS | Status: DC
  Administered 2023-07-26: 10 meq via INTRAVENOUS
  Filled 2023-07-26: qty 100

## 2023-07-26 MED ORDER — POTASSIUM CHLORIDE CRYS ER 20 MEQ PO TBCR
40.0000 meq | EXTENDED_RELEASE_TABLET | Freq: Two times a day (BID) | ORAL | Status: DC
  Administered 2023-07-26: 40 meq via ORAL
  Filled 2023-07-26: qty 2

## 2023-07-26 MED ORDER — POTASSIUM CHLORIDE CRYS ER 20 MEQ PO TBCR
40.0000 meq | EXTENDED_RELEASE_TABLET | Freq: Two times a day (BID) | ORAL | 0 refills | Status: DC
  Filled 2023-07-26: qty 8, 2d supply, fill #0

## 2023-07-26 MED ORDER — BUPROPION HCL ER (XL) 150 MG PO TB24: 450.0000 mg | ORAL_TABLET | Freq: Every day | ORAL | Status: DC

## 2023-07-26 MED ORDER — RIVAROXABAN 15 MG PO TABS
15.0000 mg | ORAL_TABLET | Freq: Every day | ORAL | 0 refills | Status: DC
Start: 2023-07-27 — End: 2024-01-18
  Filled 2023-07-26: qty 30, 30d supply, fill #0

## 2023-07-26 NOTE — Discharge Summary (Signed)
 Physician Discharge Summary   Patient: Brendan Butler MRN: [id number]  DOB: 06/27/1950   Admit:     Date of Admission: 07/25/2023 Admitted from: home   Discharge: Date of discharge: 07/26/23 Disposition: Home Condition at discharge: good  CODE STATUS: FULL CODE     Discharge Physician: Melodi Sprung, DO [facility]     PCP: Jearldine Mina, MD  Recommendations for Outpatient Follow-up:  Follow up with PCP Jearldine Mina, MD in 1 week or so. Needs recheck blood work (metabolic function panel)    Discharge Instructions     Diet - low sodium heart healthy   Complete by: As directed    Increase activity slowly   Complete by: As directed          Discharge Diagnoses: Principal Problem:   Abdominal pain Active Problems:   Nausea vomiting and diarrhea   Acute renal failure superimposed on stage 3b chronic kidney disease (HCC)   Lactic acidosis   CAD (coronary artery disease)   Myocardial injury   Persistent atrial fibrillation (HCC)   Chronic heart failure with preserved ejection fraction (HFpEF) (HCC)   Essential hypertension   Hypokalemia   Prediabetes   Dyslipidemia   Depression with anxiety      Hospital course / significant events:   HPI: Brendan Butler is a 73 y.o. male with medical history significant of hypertension, hyperlipidemia, diabetes mellitus, CAD, STEMI, diastolic CHF, depression with anxiety, CKD-3B, A-fib on Xarelto , colon cancer (s/p of right colectomy), intra-abdominal abscess, s/p of left nephrectomy, who presents with intermittent nausea and vomiting for most of 1 week.  He also has mild middle abdominal pain which is intermittent, aching, nonradiating.  He has 1 episode of diarrhea    05/29: to ED from home. CT abd/pelv non-acute. EKG Afib w/ HR 130s --> SR w/ HR 80s, WBC 25.4, lactic acid 2.6 --> 5.1, potassium 2.8, Cr 1.95 w/ GFR 36. Admitted to hospitalist service on empiric abx (ceftriaxone , metronidazole ), IV  fluids. GI PCR/Cdiff studies ordered 05/30: Lactic acid improved to 1.3, potassium still low at 3.0, WBC improved to 18.4. Pt states he feels much better today, no further episodes diarrhea, abd pain has resolved, eager for discharge home.      Consultants:  none  Procedures/Surgeries: none      ASSESSMENT & PLAN:   Abdominal pain, nausea vomiting and diarrhea CT of abdomen/pelvis is negative for abscess, colitis, other acute process  Question viral gastroenteritis  Given improvement, discharge home with return precautions    Acute renal failure superimposed on stage 3b chronic kidney disease Recent baseline creatinine 1.29 on 02/10/2023 but multiple other measurements in the range of 1.8-1.9.  On admission, creatinine 1.95, BUN 24, GFR 36.   IV fluid as above Of note, he takes Lasix  maybe 1-2 times per month, is not taking spironolactone   Hold Entresto  for now pending PCP follow up / BP elevation. BP has been at goal here off this medications Monitor BMP outpatient    Lactic acidosis - resolved Lactic acid 2.6 --> 5.1 --> 1.3 no fever, not meeting sepsis criteria Possibly due to dehydration Recheck as needed    CAD (coronary artery disease) and myocardial injur Troponin 23 --> flat.  No chest pain.  Likely demand ischemia. Crestor  and Effient   Paroxysmal atrial fibrillation Afib RVR on initial EKG resovled without rate control intervention Xarelto  reduced to 15 mg daily based on renal function  Amiodarone    Chronic heart failure with preserved ejection  fraction  Essential HTN 2D echo on 08/17/2022 showed EF of 50%.  . CHF currently compensated, no clinical s/s CHF and BNP 86.3 Outpatient follow up  Continue lasix  prn Holding entresto  for now pending repeat renal function / can restart if BP significantly elevated at home    Hypokalemia Replace as needed - short course on discharge  Monitor BMP   Pediabetes Recent A1c 5.6, patient is taking Jardiance  Monitor  Glc w/ AM labs Follow outpatient   Dyslipidemia Crestor    Depression with anxiety Continue home medications Paxil , Seroquel       Overweight / Borderline Class 1 Obesity based on BMI: Body mass index is 29.17 kg/m.Aaron Aas Significantly low or high BMI is associated with higher medical risk.  Underweight - under 18  overweight - 25 to 29 obese - 30 or more Class 1 obesity: BMI of 30.0 to 34 Class 2 obesity: BMI of 35.0 to 39 Class 3 obesity: BMI of 40.0 to 49 Super Morbid Obesity: BMI 50-59 Super-super Morbid Obesity: BMI 60+ Healthy nutrition and physical activity advised as adjunct to other disease management and risk reduction treatments          Discharge Instructions  Allergies as of 07/26/2023       Reactions   Levitra [vardenafil] Other (See Comments)   Blurred vision   Lynnann Sartorius ] Hives   Lipitor  [atorvastatin ] Other (See Comments)   Myalgias         Medication List     PAUSE taking these medications    Entresto  24-26 MG Wait to take this until your doctor or other care provider tells you to start again. Generic drug: sacubitril -valsartan  TAKE 1 TABLET TWICE DAILY       STOP taking these medications    methocarbamol  500 MG tablet Commonly known as: ROBAXIN    ondansetron  4 MG tablet Commonly known as: Zofran    pantoprazole  40 MG tablet Commonly known as: PROTONIX    spironolactone  25 MG tablet Commonly known as: ALDACTONE        TAKE these medications    amiodarone  200 MG tablet Commonly known as: PACERONE  TAKE 1 TABLET EVERY DAY   buPROPion  150 MG 24 hr tablet Commonly known as: WELLBUTRIN  XL Take 3 tablets (450 mg total) by mouth daily.   CertaVite/Antioxidants Tabs Take 1 tablet by mouth daily.   empagliflozin  10 MG Tabs tablet Commonly known as: JARDIANCE  Take 1 tablet (10 mg total) by mouth daily.   fluticasone  50 MCG/ACT nasal spray Commonly known as: FLONASE  Place 2 sprays into both nostrils daily.   furosemide  40  MG tablet Commonly known as: LASIX  Take 1 tablet (40 mg total) by mouth daily as needed for edema or fluid. What changed:  when to take this reasons to take this   lactose free nutrition Liqd Take 237 mLs by mouth 2 (two) times daily between meals.   nitroGLYCERIN  0.4 MG SL tablet Commonly known as: NITROSTAT  Place 0.4 mg under the tongue every 5 (five) minutes as needed for chest pain.   PARoxetine  40 MG tablet Commonly known as: PAXIL  Take 40 mg by mouth daily.   potassium chloride  SA 20 MEQ tablet Commonly known as: KLOR-CON  M Take 2 tablets (40 mEq total) by mouth 2 (two) times daily for 2 days. Start taking on: Jul 27, 2023   prasugrel  10 MG Tabs tablet Commonly known as: EFFIENT  Take 10 mg by mouth daily.   pregabalin  150 MG capsule Commonly known as: LYRICA  Take 1 capsule (150 mg total) by mouth  2 (two) times daily. What changed: when to take this   QUEtiapine  25 MG tablet Commonly known as: SEROQUEL  Take 1 tablet (25 mg total) by mouth at bedtime.   Rivaroxaban  15 MG Tabs tablet Commonly known as: XARELTO  Take 1 tablet (15 mg total) by mouth daily. Start taking on: Jul 27, 2023 What changed:  medication strength how much to take   rosuvastatin  20 MG tablet Commonly known as: CRESTOR  TAKE 1 TABLET EVERY DAY   VITAMIN D-3 PO Take 1 capsule by mouth daily.         Follow-up Information     Husain, Karrar, MD. Schedule an appointment as soon as possible for a visit.   Specialty: Internal Medicine Why: Hospital follow up Contact information: [address] [place] [facility] [place] [address] [phone]                 Allergies  Allergen Reactions   Levitra [Vardenafil] Other (See Comments)    Blurred vision   Asa [Aspirin ] Hives   Lipitor  [Atorvastatin ] Other (See Comments)    Myalgias      Subjective: pt reports feeling much better today, abd pain has resolved, no additional diarrhea, no fever/chills, tolerating diet, eager  for dc home    Discharge Exam: BP 116/66 (BP Location: Right Arm)   Pulse 66   Temp 98.1 F (36.7 C) (Oral)   Resp 18   Ht 5\' 10"  (1.778 m)   Wt 92.2 kg   SpO2 96%   BMI 29.17 kg/m  General: Pt is alert, awake, not in acute distress Cardiovascular: RRR, S1/S2 +, no rubs, no gallops Respiratory: CTA bilaterally, no wheezing, no rhonchi Abdominal: Soft, NT, ND, bowel sounds + Extremities: no edema, no cyanosis     The results of significant diagnostics from this hospitalization (including imaging, microbiology, ancillary and laboratory) are listed below for reference.     Microbiology: Recent Results (from the past 240 hours)  Blood culture (routine x 2)     Status: None (Preliminary result)   Collection Time: 07/25/23  1:20 PM   Specimen: BLOOD LEFT ARM  Result Value Ref Range Status   Specimen Description BLOOD LEFT ARM  Final   Special Requests   Final    BOTTLES DRAWN AEROBIC ONLY Blood Culture results may not be optimal due to an inadequate volume of blood received in culture bottles   Culture   Final    NO GROWTH < 12 HOURS Performed at [facility], [address], [place], [place] [address]    Report Status PENDING  Incomplete  Blood culture (routine x 2)     Status: None (Preliminary result)   Collection Time: 07/25/23  6:53 PM   Specimen: BLOOD  Result Value Ref Range Status   Specimen Description BLOOD BLOOD RIGHT HAND  Final   Special Requests   Final    BOTTLES DRAWN AEROBIC AND ANAEROBIC Blood Culture results may not be optimal due to an inadequate volume of blood received in culture bottles   Culture   Final    NO GROWTH < 12 HOURS Performed at [facility], [address], [place], [place] [address]    Report Status PENDING  Incomplete     Labs: BNP (last 3 results) Recent Labs    08/21/22 0041 09/03/22 1100 07/25/23 1235  BNP 41.5 22.6 86.3   Basic Metabolic Panel: Recent Labs  Lab 07/25/23 1235 07/26/23 0222  NA  138 137  K 2.8* 3.0*  CL 102 105  CO2 20* 23  GLUCOSE 200* 99  BUN 24* 22  CREATININE 1.95* 1.94*  CALCIUM  9.1 8.1*  MG 2.2  --   PHOS 2.7  --    Liver Function Tests: Recent Labs  Lab 07/25/23 1235  AST 25  ALT 18  ALKPHOS 88  BILITOT 1.7*  PROT 7.8  ALBUMIN  3.8   Recent Labs  Lab 07/25/23 1235  LIPASE 42   No results for input(s): "AMMONIA" in the last 168 hours. CBC: Recent Labs  Lab 07/25/23 1235 07/26/23 0222  WBC 21.4* 18.4*  HGB 17.4* 13.7  HCT 49.7 39.2  MCV 83.4 84.8  PLT 488* 328   Cardiac Enzymes: No results for input(s): "CKTOTAL", "CKMB", "CKMBINDEX", "TROPONINI" in the last 168 hours. BNP: Invalid input(s): "POCBNP" CBG: Recent Labs  Lab 07/25/23 2123 07/26/23 0757 07/26/23 1126  GLUCAP 149* 95 102*   D-Dimer No results for input(s): "DDIMER" in the last 72 hours. Hgb A1c No results for input(s): "HGBA1C" in the last 72 hours. Lipid Profile No results for input(s): "CHOL", "HDL", "LDLCALC", "TRIG", "CHOLHDL", "LDLDIRECT" in the last 72 hours. Thyroid  function studies No results for input(s): "TSH", "T4TOTAL", "T3FREE", "THYROIDAB" in the last 72 hours.  Invalid input(s): "FREET3" Anemia work up No results for input(s): "VITAMINB12", "FOLATE", "FERRITIN", "TIBC", "IRON", "RETICCTPCT" in the last 72 hours. Urinalysis    Component Value Date/Time   COLORURINE YELLOW (A) 07/26/2023 0950   APPEARANCEUR CLEAR (A) 07/26/2023 0950   LABSPEC 1.035 (H) 07/26/2023 0950   PHURINE 5.0 07/26/2023 0950   GLUCOSEU >=500 (A) 07/26/2023 0950   HGBUR NEGATIVE 07/26/2023 0950   BILIRUBINUR NEGATIVE 07/26/2023 0950   KETONESUR NEGATIVE 07/26/2023 0950   PROTEINUR 30 (A) 07/26/2023 0950   NITRITE NEGATIVE 07/26/2023 0950   LEUKOCYTESUR NEGATIVE 07/26/2023 0950   Sepsis Labs Recent Labs  Lab 07/25/23 1235 07/26/23 0222  WBC 21.4* 18.4*   Microbiology Recent Results (from the past 240 hours)  Blood culture (routine x 2)     Status: None  (Preliminary result)   Collection Time: 07/25/23  1:20 PM   Specimen: BLOOD LEFT ARM  Result Value Ref Range Status   Specimen Description BLOOD LEFT ARM  Final   Special Requests   Final    BOTTLES DRAWN AEROBIC ONLY Blood Culture results may not be optimal due to an inadequate volume of blood received in culture bottles   Culture   Final    NO GROWTH < 12 HOURS Performed at [facility], [address] [place], [place] [address]    Report Status PENDING  Incomplete  Blood culture (routine x 2)     Status: None (Preliminary result)   Collection Time: 07/25/23  6:53 PM   Specimen: BLOOD  Result Value Ref Range Status   Specimen Description BLOOD BLOOD RIGHT HAND  Final   Special Requests   Final    BOTTLES DRAWN AEROBIC AND ANAEROBIC Blood Culture results may not be optimal due to an inadequate volume of blood received in culture bottles   Culture   Final    NO GROWTH < 12 HOURS Performed at [facility], [address], [place], [place] [address]    Report Status PENDING  Incomplete   Imaging CT ABDOMEN PELVIS W CONTRAST Result Date: 07/25/2023 CLINICAL DATA:  Abdominal pain, nausea, and vomiting beginning last night. Diverticulitis. Colon carcinoma. * Tracking Code: BO * EXAM: CT ABDOMEN AND PELVIS WITH CONTRAST TECHNIQUE: Multidetector CT imaging of the abdomen  and pelvis was performed using the standard protocol following bolus administration of intravenous contrast. RADIATION DOSE REDUCTION: This exam was performed according to the departmental dose-optimization program which includes automated exposure control, adjustment of the mA and/or kV according to patient size and/or use of iterative reconstruction technique. CONTRAST:  80mL OMNIPAQUE  IOHEXOL  300 MG/ML  SOLN COMPARISON:  01/12/2023 FINDINGS: Lower Chest: No acute findings. Hepatobiliary: No suspicious hepatic masses identified. Prior cholecystectomy. No evidence of biliary obstruction. Pancreas:  No mass  or inflammatory changes. Spleen: Within normal limits in size and appearance. Adrenals/Urinary Tract: Stable postop changes from prior left nephrectomy. No suspicious right renal mass identified. No evidence of ureteral calculi or hydronephrosis. Unremarkable unopacified urinary bladder. Stomach/Bowel: Small hiatal hernia. Postop changes again seen from prior right colectomy. No soft tissue mass identified. No evidence of obstruction, inflammatory process or abnormal fluid collections. Diverticulosis is seen mainly involving the descending and sigmoid colon, however there is no evidence of diverticulitis. Vascular/Lymphatic: No pathologically enlarged lymph nodes. No acute vascular findings. Reproductive:  No mass or other significant abnormality. Other:  None. Musculoskeletal: No suspicious bone lesions identified. Several old right rib fracture deformities and old L1 vertebral body compression fracture are noted. IMPRESSION: No acute findings. Stable postop changes from previous right colectomy and left nephrectomy. No evidence of recurrent or metastatic carcinoma. Colonic diverticulosis, without radiographic evidence of diverticulitis. Small hiatal hernia. Electronically Signed   By: Marlyce Sine M.D.   On: 07/25/2023 18:17      Time coordinating discharge: over 30 minutes  SIGNED:  Christopherjame Carnell DO [facility]

## 2023-07-26 NOTE — Hospital Course (Signed)
 Hospital course / significant events:   HPI: Brendan Butler is a 73 y.o. male with medical history significant of hypertension, hyperlipidemia, diabetes mellitus, CAD, STEMI, diastolic CHF, depression with anxiety, CKD-3B, A-fib on Xarelto , colon cancer (s/p of right colectomy), intra-abdominal abscess, s/p of left nephrectomy, who presents with intermittent nausea and vomiting for most of 1 week.  He also has mild middle abdominal pain which is intermittent, aching, nonradiating.  He has 1 episode of diarrhea    05/29: to ED from home. CT abd/pelv non-acute. EKG Afib w/ HR 130s --> SR w/ HR 80s, WBC 25.4, lactic acid 2.6 --> 5.1, potassium 2.8, Cr 1.95 w/ GFR 36. Admitted to hospitalist service on empiric abx (ceftriaxone , metronidazole ), IV fluids. GI PCR/Cdiff studies ordered 05/30: Lactic acid improved to 1.3, potassium still low at 3.0, WBC improved to 18.4     Consultants:  ***  Procedures/Surgeries: ***      ASSESSMENT & PLAN:   Abdominal pain, nausea vomiting and diarrhea CT of abdomen/pelvis is negative for abscess, colitis, other acute process  Question viral gastroenteritis however patient presented w/ significant leukocytosis WBC 21.4, cannot completely rule out the possibility of a bacterial infection.   IV fluids normal saline Rocephin  and Flagyl  (patient received 1 dose of vancomycin  and cefepime  in ED) Follow-up blood culture Follow-up C diff and GI pathogen panel   ***Acute renal failure superimposed on stage 3b chronic kidney disease Recent baseline creatinine 1.29 on 02/10/2023.   On admission, creatinine 1.95, BUN 24, GFR 36.   Likely due to dehydration and continuation of diuretics IV fluid as above Hold diuretics, Lasix  and spironolactone  Hold Entresto  Monitor BMP   Lactic acidosis - resolved Lactic acid 2.6 --> 5.1 --> 1.3 no fever, not meeting sepsis criteria Possibly due to dehydration IV fluids above   CAD (coronary artery disease) and  myocardial injur Troponin 23 --> flat.  No chest pain.  Likely demand ischemia. Crestor  and Effient   Paroxysmal atrial fibrillation Afib RVR on initial EKG resovled without rate control intervention Xarelto  Amiodarone    Chronic heart failure with preserved ejection fraction  2D echo on 08/17/2022 showed EF of 50%.  . CHF currently compensated, no clinical s/s CHF and BNP 86.3 Hold Lasix  and spironolactone  due to renal function Caution w/ IV fluids    Essential hypertension IV hydralazine  as needed Hold Lasix , spironolactone  and Entresto  due to worsening renal function   Hypokalemia Replace as needed Monitor BMP   Pediabetes Recent A1c 5.6, patient is taking Jardiance  Monitor Glc w/ AM labs Follow outpatient   Dyslipidemia Crestor    Depression with anxiety Continue home medications Paxil , Seroquel       Overweight / Borderline Class 1 Obesity based on BMI: Body mass index is 29.17 kg/m.Brendan Butler Significantly low or high BMI is associated with higher medical risk.  Underweight - under 18  overweight - 25 to 29 obese - 30 or more Class 1 obesity: BMI of 30.0 to 34 Class 2 obesity: BMI of 35.0 to 39 Class 3 obesity: BMI of 40.0 to 49 Super Morbid Obesity: BMI 50-59 Super-super Morbid Obesity: BMI 60+ Healthy nutrition and physical activity advised as adjunct to other disease management and risk reduction treatments    DVT prophylaxis: Xarelto  IV fluids: gentle NS continuous IV fluids at 75 mL/h Nutrition: cardiac/carb diet Central lines / other devices: none  Code Status: FULL CODE ACP documentation reviewed: MOST form is on file in [facility]  TOC needs: TBD Medical barriers to dispo: hypokalemia, renal function. Expected  medical readiness for discharge 1-2 days.

## 2023-07-26 NOTE — Care Management Obs Status (Signed)
 MEDICARE OBSERVATION STATUS NOTIFICATION   Patient Details  Name: Brendan Butler MRN: [id number] Date of Birth: 1950-10-23   Medicare Observation Status Notification Given:  Rudolph Cost, CMA 07/26/2023, 1:43 PM

## 2023-07-26 NOTE — Plan of Care (Signed)
 Pt's first night on unit.  Will assess and update plan of care as needed.   Problem: Education: Goal: Ability to describe self-care measures that may prevent or decrease complications (Diabetes Survival Skills Education) will improve Outcome: Progressing Goal: Individualized Educational Video(s) Outcome: Progressing   Problem: Coping: Goal: Ability to adjust to condition or change in health will improve Outcome: Progressing   Problem: Fluid Volume: Goal: Ability to maintain a balanced intake and output will improve Outcome: Progressing   Problem: Health Behavior/Discharge Planning: Goal: Ability to identify and utilize available resources and services will improve Outcome: Progressing Goal: Ability to manage health-related needs will improve Outcome: Progressing   Problem: Metabolic: Goal: Ability to maintain appropriate glucose levels will improve Outcome: Progressing   Problem: Nutritional: Goal: Maintenance of adequate nutrition will improve Outcome: Progressing Goal: Progress toward achieving an optimal weight will improve Outcome: Progressing   Problem: Skin Integrity: Goal: Risk for impaired skin integrity will decrease Outcome: Progressing   Problem: Tissue Perfusion: Goal: Adequacy of tissue perfusion will improve Outcome: Progressing   Problem: Education: Goal: Knowledge of General Education information will improve Description: Including pain rating scale, medication(s)/side effects and non-pharmacologic comfort measures Outcome: Progressing   Problem: Health Behavior/Discharge Planning: Goal: Ability to manage health-related needs will improve Outcome: Progressing   Problem: Clinical Measurements: Goal: Ability to maintain clinical measurements within normal limits will improve Outcome: Progressing Goal: Will remain free from infection Outcome: Progressing Goal: Diagnostic test results will improve Outcome: Progressing Goal: Respiratory  complications will improve Outcome: Progressing Goal: Cardiovascular complication will be avoided Outcome: Progressing   Problem: Activity: Goal: Risk for activity intolerance will decrease Outcome: Progressing   Problem: Nutrition: Goal: Adequate nutrition will be maintained Outcome: Progressing   Problem: Coping: Goal: Level of anxiety will decrease Outcome: Progressing   Problem: Elimination: Goal: Will not experience complications related to bowel motility Outcome: Progressing Goal: Will not experience complications related to urinary retention Outcome: Progressing   Problem: Pain Managment: Goal: General experience of comfort will improve and/or be controlled Outcome: Progressing   Problem: Safety: Goal: Ability to remain free from injury will improve Outcome: Progressing   Problem: Skin Integrity: Goal: Risk for impaired skin integrity will decrease Outcome: Progressing

## 2023-07-26 NOTE — TOC Initial Note (Signed)
 Transition of Care [facility]) - Initial/Assessment Note    Patient Details  Name: Brendan Butler MRN: [id number] Date of Birth: 11/08/1950  Transition [facility]) CM/SW Contact:    Elsie Halo, RN Phone Number: 07/26/2023, 10:20 AM  Clinical Narrative:                  Patient lives at home independently. He drives and doesn't report any difficulty getting to appointments or obtaining medications. He has 4 daughters who are also available to assist. His PCP is Dr. Kym Phoenix. He uses a Emergency planning/management officer in [place].  He has a RW and Cane from a previous surgery, but he doesn't use them. One of his daughters will transport him home at discharge.  Therapy recs are pending. TOC will continue to follow for dc planning.  Expected Discharge Plan: Home/Self Care Barriers to Discharge: Continued Medical Work up   Patient Goals and CMS Choice            Expected Discharge Plan and Services       Living arrangements for the past 2 months: Single Family Home                                      Prior Living Arrangements/Services Living arrangements for the past 2 months: Single Family Home Lives with:: Self              Current home services:  (Has a walker and cane from previous surgery, but doesn't use them)    Activities of Daily Living   ADL Screening (condition at time of admission) Independently performs ADLs?: Yes (appropriate for developmental age) Is the patient deaf or have difficulty hearing?: No Does the patient have difficulty seeing, even when wearing glasses/contacts?: No Does the patient have difficulty concentrating, remembering, or making decisions?: No  Permission Sought/Granted                  Emotional Assessment       Orientation: : Oriented to Self, Oriented to Place, Oriented to  Time, Oriented to Situation   Psych Involvement: No (comment)  Admission diagnosis:  Lactic acidosis [E87.20] AKI (acute kidney injury) (HCC)  [N17.9] Abdominal pain [R10.9] Leukocytosis, unspecified type [D72.829] Nausea and vomiting, unspecified vomiting type [R11.2] Patient Active Problem List   Diagnosis Date Noted   Abdominal pain 07/25/2023   Prediabetes 07/25/2023   CAD (coronary artery disease) 07/25/2023   Acute renal failure superimposed on stage 3b chronic kidney disease (HCC) 07/25/2023   Myocardial injury 07/25/2023   Acute renal failure superimposed on stage 3 chronic kidney disease, unspecified acute renal failure type, unspecified whether stage 3a or 3b CKD (HCC) 01/12/2023   Lactic acidosis 01/12/2023   Hyponatremia 10/09/2022   History of colon cancer 10/09/2022   Falls 10/09/2022   Protein-calorie malnutrition, severe 09/28/2022   Acute kidney injury superimposed on chronic kidney disease (HCC) 09/27/2022   Adenocarcinoma, colon (HCC) 09/27/2022   Gastroenteritis 09/11/2022   Nausea vomiting and diarrhea 09/10/2022   Depression with anxiety 09/10/2022   Chronic heart failure with preserved ejection fraction (HFpEF) (HCC) 09/10/2022   Persistent atrial fibrillation (HCC) 08/17/2022   Hypochromic anemia 08/17/2022   Hypercoagulable state due to typical atrial flutter (HCC) 06/14/2022   Falls frequently 05/23/2022   MDD (major depressive disorder), recurrent episode, mild (HCC) 05/10/2022   Delirium due to another medical condition 05/10/2022  Atrial flutter (HCC) 05/06/2022   Hypokalemia 05/06/2022   Pulmonary nodules 12/27/2021   Symptomatic bradycardia 12/25/2021   Preoperative cardiovascular examination    Intra-abdominal abscess (HCC) 01/17/2021   Lung nodule 01/17/2021   Aneurysm of ascending aorta (HCC) 01/17/2021   CKD stage 3b, GFR 30-44 ml/min (HCC) 01/17/2021   Weight loss 01/17/2021   Bronchiectasis (HCC) 01/17/2021   Chronic cholecystitis 01/16/2021   Traumatic hematoma of left popliteal region 01/28/2016   DJD (degenerative joint disease) of pelvis 01/28/2016   Atypical chest pain     Chest pain 01/27/2016   Unstable angina (HCC) 04/21/2013   Leukocytosis 04/21/2013   Subsequent ST elevation (STEMI) myocardial infarction of anterior wall within 4 weeks of initial infarction [facility]) 01/08/2013    Class: Hospitalized for   Illiteracy and low-level literacy 01/08/2013   Aspirin  allergy- hives 01/06/2013   STEMI 12/29/12 Rx'd with LAD DES with early stent thrombosis, STEMI-PCI 01/05/14 01/05/2013   Coronary artery disease involving native coronary artery of native heart without angina pectoris 06/29/2007   Dyslipidemia 06/25/2007   Essential hypertension 06/25/2007   ALLERGIC RHINITIS 06/25/2007   OSA on CPAP 06/25/2007   PCP:  Jearldine Mina, MD Pharmacy:   [facility] [address] [place] [address] Phone: [phone] Fax: [phone]  [facility] - [place], [place] - [address] [place] [address] Phone: [phone] Fax: [phone]     Social Drivers of Health (SDOH) Social History: SDOH Screenings   Food Insecurity: No Food Insecurity (07/25/2023)  Housing: Low Risk  (07/25/2023)  Transportation Needs: No Transportation Needs (07/25/2023)  Utilities: Not At Risk (07/25/2023)  Alcohol Screen: Low Risk  (08/20/2022)  Financial Resource Strain: Low Risk  (08/20/2022)  Social Connections: Moderately Isolated (07/25/2023)  Tobacco Use: High Risk (07/25/2023)   SDOH Interventions:     Readmission Risk Interventions    10/10/2022    2:40 PM 09/28/2022    1:19 PM  Readmission Risk Prevention Plan  Transportation Screening Complete Complete  Medication Review (RN Care Manager) Referral to Pharmacy Referral to Pharmacy  PCP or Specialist appointment within 3-5 days of discharge Complete Complete  HRI or Home Care Consult Complete Complete  SW Recovery Care/Counseling Consult Complete Complete  Palliative Care Screening Not Applicable Not Applicable  Skilled Nursing Facility  Not Applicable Not Applicable

## 2023-07-30 LAB — CULTURE, BLOOD (ROUTINE X 2)
Culture: NO GROWTH
Culture: NO GROWTH

## 2023-08-14 DIAGNOSIS — Z0000 Encounter for general adult medical examination without abnormal findings: Secondary | ICD-10-CM | POA: Diagnosis not present

## 2023-08-14 DIAGNOSIS — Z125 Encounter for screening for malignant neoplasm of prostate: Secondary | ICD-10-CM | POA: Diagnosis not present

## 2023-08-14 DIAGNOSIS — I5032 Chronic diastolic (congestive) heart failure: Secondary | ICD-10-CM | POA: Diagnosis not present

## 2023-08-14 DIAGNOSIS — E538 Deficiency of other specified B group vitamins: Secondary | ICD-10-CM | POA: Diagnosis not present

## 2023-08-14 DIAGNOSIS — F331 Major depressive disorder, recurrent, moderate: Secondary | ICD-10-CM | POA: Diagnosis not present

## 2023-08-14 DIAGNOSIS — D6869 Other thrombophilia: Secondary | ICD-10-CM | POA: Diagnosis not present

## 2023-08-14 DIAGNOSIS — I2510 Atherosclerotic heart disease of native coronary artery without angina pectoris: Secondary | ICD-10-CM | POA: Diagnosis not present

## 2023-08-14 DIAGNOSIS — J869 Pyothorax without fistula: Secondary | ICD-10-CM | POA: Diagnosis not present

## 2023-08-14 DIAGNOSIS — N1832 Chronic kidney disease, stage 3b: Secondary | ICD-10-CM | POA: Diagnosis not present

## 2023-08-14 DIAGNOSIS — E7800 Pure hypercholesterolemia, unspecified: Secondary | ICD-10-CM | POA: Diagnosis not present

## 2023-08-14 DIAGNOSIS — R7303 Prediabetes: Secondary | ICD-10-CM | POA: Diagnosis not present

## 2023-08-16 ENCOUNTER — Other Ambulatory Visit: Payer: Self-pay | Admitting: Cardiovascular Disease

## 2023-08-28 ENCOUNTER — Other Ambulatory Visit: Payer: Self-pay | Admitting: Cardiovascular Disease

## 2023-09-12 DIAGNOSIS — I10 Essential (primary) hypertension: Secondary | ICD-10-CM | POA: Diagnosis not present

## 2023-09-16 ENCOUNTER — Other Ambulatory Visit: Payer: Self-pay | Admitting: Student

## 2023-09-16 ENCOUNTER — Other Ambulatory Visit (HOSPITAL_COMMUNITY): Payer: Self-pay | Admitting: Physician Assistant

## 2023-09-18 ENCOUNTER — Other Ambulatory Visit: Payer: Self-pay | Admitting: Cardiovascular Disease

## 2023-10-09 ENCOUNTER — Other Ambulatory Visit: Payer: Self-pay | Admitting: Student

## 2023-10-10 ENCOUNTER — Other Ambulatory Visit: Payer: Self-pay | Admitting: Cardiovascular Disease

## 2023-10-16 ENCOUNTER — Emergency Department (HOSPITAL_COMMUNITY)

## 2023-10-16 ENCOUNTER — Emergency Department (HOSPITAL_COMMUNITY)
Admission: EM | Admit: 2023-10-16 | Discharge: 2023-10-17 | Disposition: A | Discharge status: Home / Self Care | Attending: Emergency Medicine | Admitting: Emergency Medicine

## 2023-10-16 ENCOUNTER — Other Ambulatory Visit: Payer: Self-pay

## 2023-10-16 DIAGNOSIS — R079 Chest pain, unspecified: Secondary | ICD-10-CM | POA: Diagnosis not present

## 2023-10-16 DIAGNOSIS — R5383 Other fatigue: Secondary | ICD-10-CM | POA: Insufficient documentation

## 2023-10-16 DIAGNOSIS — K668 Other specified disorders of peritoneum: Secondary | ICD-10-CM | POA: Diagnosis not present

## 2023-10-16 DIAGNOSIS — R918 Other nonspecific abnormal finding of lung field: Secondary | ICD-10-CM | POA: Diagnosis not present

## 2023-10-16 DIAGNOSIS — N3289 Other specified disorders of bladder: Secondary | ICD-10-CM | POA: Diagnosis not present

## 2023-10-16 DIAGNOSIS — E876 Hypokalemia: Secondary | ICD-10-CM | POA: Insufficient documentation

## 2023-10-16 DIAGNOSIS — R112 Nausea with vomiting, unspecified: Secondary | ICD-10-CM | POA: Diagnosis present

## 2023-10-16 DIAGNOSIS — I2510 Atherosclerotic heart disease of native coronary artery without angina pectoris: Secondary | ICD-10-CM | POA: Diagnosis not present

## 2023-10-16 DIAGNOSIS — I499 Cardiac arrhythmia, unspecified: Secondary | ICD-10-CM | POA: Diagnosis not present

## 2023-10-16 DIAGNOSIS — R0602 Shortness of breath: Secondary | ICD-10-CM | POA: Insufficient documentation

## 2023-10-16 DIAGNOSIS — I1310 Hypertensive heart and chronic kidney disease without heart failure, with stage 1 through stage 4 chronic kidney disease, or unspecified chronic kidney disease: Secondary | ICD-10-CM | POA: Diagnosis not present

## 2023-10-16 DIAGNOSIS — R61 Generalized hyperhidrosis: Secondary | ICD-10-CM | POA: Diagnosis not present

## 2023-10-16 DIAGNOSIS — N189 Chronic kidney disease, unspecified: Secondary | ICD-10-CM | POA: Insufficient documentation

## 2023-10-16 DIAGNOSIS — C189 Malignant neoplasm of colon, unspecified: Secondary | ICD-10-CM | POA: Diagnosis not present

## 2023-10-16 DIAGNOSIS — Z7901 Long term (current) use of anticoagulants: Secondary | ICD-10-CM | POA: Diagnosis not present

## 2023-10-16 DIAGNOSIS — R109 Unspecified abdominal pain: Secondary | ICD-10-CM | POA: Diagnosis not present

## 2023-10-16 LAB — CBC WITH DIFFERENTIAL/PLATELET
Abs Immature Granulocytes: 0.08 K/uL — ABNORMAL HIGH (ref 0.00–0.07)
Basophils Absolute: 0.1 K/uL (ref 0.0–0.1)
Basophils Relative: 0 %
Eosinophils Absolute: 0.3 K/uL (ref 0.0–0.5)
Eosinophils Relative: 2 %
HCT: 48.2 % (ref 39.0–52.0)
Hemoglobin: 16.6 g/dL (ref 13.0–17.0)
Immature Granulocytes: 1 %
Lymphocytes Relative: 19 %
Lymphs Abs: 3.2 K/uL (ref 0.7–4.0)
MCH: 29.4 pg (ref 26.0–34.0)
MCHC: 34.4 g/dL (ref 30.0–36.0)
MCV: 85.3 fL (ref 80.0–100.0)
Monocytes Absolute: 2 K/uL — ABNORMAL HIGH (ref 0.1–1.0)
Monocytes Relative: 12 %
Neutro Abs: 10.9 K/uL — ABNORMAL HIGH (ref 1.7–7.7)
Neutrophils Relative %: 66 %
Platelets: 335 K/uL (ref 150–400)
RBC: 5.65 MIL/uL (ref 4.22–5.81)
RDW: 12.2 % (ref 11.5–15.5)
WBC: 16.5 K/uL — ABNORMAL HIGH (ref 4.0–10.5)
nRBC: 0 % (ref 0.0–0.2)

## 2023-10-16 LAB — COMPREHENSIVE METABOLIC PANEL WITH GFR
ALT: 21 U/L (ref 0–44)
AST: 26 U/L (ref 15–41)
Albumin: 3.9 g/dL (ref 3.5–5.0)
Alkaline Phosphatase: 83 U/L (ref 38–126)
Anion gap: 17 — ABNORMAL HIGH (ref 5–15)
BUN: 19 mg/dL (ref 8–23)
CO2: 18 mmol/L — ABNORMAL LOW (ref 22–32)
Calcium: 9 mg/dL (ref 8.9–10.3)
Chloride: 102 mmol/L (ref 98–111)
Creatinine, Ser: 1.46 mg/dL — ABNORMAL HIGH (ref 0.61–1.24)
GFR, Estimated: 51 mL/min — ABNORMAL LOW (ref >60)
Glucose, Bld: 101 mg/dL — ABNORMAL HIGH (ref 70–99)
Potassium: 2.9 mmol/L — ABNORMAL LOW (ref 3.5–5.1)
Sodium: 137 mmol/L (ref 135–145)
Total Bilirubin: 2.6 mg/dL — ABNORMAL HIGH (ref 0.0–1.2)
Total Protein: 7.2 g/dL (ref 6.5–8.1)

## 2023-10-16 LAB — TROPONIN I (HIGH SENSITIVITY)
Troponin I (High Sensitivity): 43 ng/L — ABNORMAL HIGH (ref <18)
Troponin I (High Sensitivity): 46 ng/L — ABNORMAL HIGH (ref <18)

## 2023-10-16 LAB — LIPASE, BLOOD: Lipase: 38 U/L (ref 11–51)

## 2023-10-16 LAB — CBG MONITORING, ED: Glucose-Capillary: 102 mg/dL — ABNORMAL HIGH (ref 70–99)

## 2023-10-16 LAB — MAGNESIUM: Magnesium: 2.2 mg/dL (ref 1.7–2.4)

## 2023-10-16 MED ORDER — POTASSIUM CHLORIDE 10 MEQ/100ML IV SOLN
10.0000 meq | INTRAVENOUS | Status: DC
  Administered 2023-10-17: 10 meq via INTRAVENOUS
  Filled 2023-10-16 (×2): qty 100

## 2023-10-16 MED ORDER — IOHEXOL 350 MG/ML SOLN
75.0000 mL | Freq: Once | INTRAVENOUS | Status: AC | PRN
  Administered 2023-10-16: 75 mL via INTRAVENOUS

## 2023-10-16 NOTE — ED Notes (Signed)
 Pt also reporting some dark stools a few days ago.

## 2023-10-16 NOTE — ED Provider Triage Note (Signed)
 Emergency Medicine Provider Triage Evaluation Note  KAYDIN KARBOWSKI , a 73 y.o. male  was evaluated in triage.  Pt complains of black stools, abdominal pain, nausea, and vomiting.  Also endorses some chest pain a couple of days ago.  Reports associated shortness of breath as well.  Review of Systems  Positive:  Negative: See above   Physical Exam  BP (!) 156/106 (BP Location: Right Arm)   Pulse 64   Temp 98 F (36.7 C)   Resp 16   Ht 5' 10 (1.778 m)   Wt 90.7 kg   SpO2 98%   BMI 28.70 kg/m  Gen:   Awake, no distress   Resp:  Normal effort  MSK:   Moves extremities without difficulty  Other:  Well healed abdominal incision  Medical Decision Making  Medically screening exam initiated at 4:08 PM.  Appropriate orders placed.  Jerel LITTIE Hummer was informed that the remainder of the evaluation will be completed by another provider, this initial triage assessment does not replace that evaluation, and the importance of remaining in the ED until their evaluation is complete.     Theotis Peers [place], [place] 10/16/23 1610

## 2023-10-16 NOTE — ED Provider Notes (Signed)
 2224 pt not yet in room, staff aware.   Bernard Drivers, MD 10/16/23 2224

## 2023-10-16 NOTE — ED Provider Notes (Signed)
 [place] EMERGENCY DEPARTMENT AT [facility] Provider Note   CSN: [id number] Arrival date & time: 10/16/23  1550     Patient presents with: Nausea and Shortness of Breath   Brendan Butler is a 73 y.o. male presents today for black stools approximately 2 days ago, night sweats, nausea, vomiting, and increased shortness of breath with exertion.  Patient denies fever, abdominal pain, urinary symptoms, hematemesis, numbness, weakness, headache, or vision changes.  {Add pertinent medical, surgical, social history, OB history to HPI:32947}  Shortness of Breath Associated symptoms: diaphoresis and vomiting        Prior to Admission medications   Medication Sig Start Date End Date Taking? Authorizing Provider  amiodarone  (PACERONE ) 100 MG tablet TAKE 1 TABLET EVERY DAY. NEED OFFICE VISIT. 10/10/23   Goodrich, Callie E, PA-C  buPROPion  (WELLBUTRIN  XL) 150 MG 24 hr tablet Take 3 tablets (450 mg total) by mouth daily. 07/26/23   Alexander, Natalie, DO  Cholecalciferol (VITAMIN D-3 PO) Take 1 capsule by mouth daily. Patient not taking: Reported on 07/25/2023    [provider]  empagliflozin  (JARDIANCE ) 10 MG TABS tablet Take 1 tablet (10 mg total) by mouth daily. 06/18/22   Goodrich, Callie E, PA-C  fluticasone  (FLONASE ) 50 MCG/ACT nasal spray Place 2 sprays into both nostrils daily. 06/14/23   [provider]  furosemide  (LASIX ) 40 MG tablet Take 1 tablet (40 mg total) by mouth daily as needed for edema or fluid. 07/26/23   Alexander, Natalie, DO  lactose free nutrition (BOOST PLUS) LIQD Take 237 mLs by mouth 2 (two) times daily between meals. 08/29/22   Swayze, Ava, DO  Multiple Vitamin (MULTIVITAMIN WITH MINERALS) TABS tablet Take 1 tablet by mouth daily. 08/29/22   Swayze, Ava, DO  nitroGLYCERIN  (NITROSTAT ) 0.4 MG SL tablet Place 0.4 mg under the tongue every 5 (five) minutes as needed for chest pain. 01/08/23   [provider]  PARoxetine  (PAXIL ) 40 MG tablet  Take 40 mg by mouth daily.    [provider]  potassium chloride  SA (KLOR-CON  M) 20 MEQ tablet Take 2 tablets (40 mEq total) by mouth 2 (two) times daily for 2 days. 07/27/23 07/29/23  Alexander, Natalie, DO  prasugrel  (EFFIENT ) 10 MG TABS tablet Take 10 mg by mouth daily. 09/12/22   [provider]  pregabalin  (LYRICA ) 150 MG capsule Take 1 capsule (150 mg total) by mouth 2 (two) times daily. Patient taking differently: Take 150 mg by mouth 3 (three) times daily. 05/24/22   Elgergawy, Brayton RAMAN, MD  QUEtiapine  (SEROQUEL ) 25 MG tablet Take 1 tablet (25 mg total) by mouth at bedtime. 05/10/22   Pokhrel, Laxman, MD  Rivaroxaban  (XARELTO ) 15 MG TABS tablet Take 1 tablet (15 mg total) by mouth daily. 07/27/23   Alexander, Natalie, DO  rosuvastatin  (CRESTOR ) 20 MG tablet TAKE 1 TABLET EVERY DAY (NEED MD APPOINTMENT FOR REFILLS) 10/11/23   Delford Maude BROCKS, MD  sacubitril -valsartan  (ENTRESTO ) 24-26 MG TAKE 1 TABLET TWICE DAILY 06/04/23   Nishan, Peter C, MD    Allergies: Levitra [vardenafil], Asa [aspirin ], and Lipitor  [atorvastatin ]    Review of Systems  Constitutional:  Positive for diaphoresis and fatigue.  Respiratory:  Positive for shortness of breath.   Gastrointestinal:  Positive for blood in stool, nausea and vomiting.    Updated Vital Signs BP 139/66   Pulse 72   Temp 98.7 F (37.1 C) (Oral)   Resp 16   Ht 5' 10 (1.778 m)   Wt 90.7 kg  SpO2 97%   BMI 28.70 kg/m   Physical Exam Vitals and nursing note reviewed. Exam conducted with a chaperone present.  Constitutional:      General: He is not in acute distress.    Appearance: He is well-developed. He is not diaphoretic.  HENT:     Head: Normocephalic and atraumatic.     Mouth/Throat:     Mouth: Mucous membranes are moist.  Eyes:     Extraocular Movements: Extraocular movements intact.     Conjunctiva/sclera: Conjunctivae normal.  Cardiovascular:     Rate and Rhythm: Normal rate and regular rhythm.     Pulses:  Normal pulses.     Heart sounds: Normal heart sounds. No murmur heard. Pulmonary:     Effort: Pulmonary effort is normal. No respiratory distress.     Breath sounds: Normal breath sounds.     Comments: Mild coarse breath sounds throughout Chest:     Chest wall: No tenderness.  Abdominal:     Palpations: Abdomen is soft.     Tenderness: There is no abdominal tenderness.  Genitourinary:    Rectum: No mass, anal fissure or external hemorrhoid. Normal anal tone.  Musculoskeletal:        General: No swelling.     Cervical back: Neck supple.  Skin:    General: Skin is warm and dry.     Capillary Refill: Capillary refill takes 2 to 3 seconds.     Coloration: Skin is pale.  Neurological:     General: No focal deficit present.     Mental Status: He is alert and oriented to person, place, and time.  Psychiatric:        Mood and Affect: Mood normal.     (all labs ordered are listed, but only abnormal results are displayed) Labs Reviewed  CBC WITH DIFFERENTIAL/PLATELET - Abnormal; Notable for the following components:      Result Value   WBC 16.5 (*)    Neutro Abs 10.9 (*)    Monocytes Absolute 2.0 (*)    Abs Immature Granulocytes 0.08 (*)    All other components within normal limits  COMPREHENSIVE METABOLIC PANEL WITH GFR - Abnormal; Notable for the following components:   Potassium 2.9 (*)    CO2 18 (*)    Glucose, Bld 101 (*)    Creatinine, Ser 1.46 (*)    Total Bilirubin 2.6 (*)    GFR, Estimated 51 (*)    Anion gap 17 (*)    All other components within normal limits  CBG MONITORING, ED - Abnormal; Notable for the following components:   Glucose-Capillary 102 (*)    All other components within normal limits  TROPONIN I (HIGH SENSITIVITY) - Abnormal; Notable for the following components:   Troponin I (High Sensitivity) 43 (*)    All other components within normal limits  TROPONIN I (HIGH SENSITIVITY) - Abnormal; Notable for the following components:   Troponin I (High  Sensitivity) 46 (*)    All other components within normal limits  RESP PANEL BY RT-PCR (RSV, FLU A&B, COVID)  RVPGX2  LIPASE, BLOOD  URINALYSIS, ROUTINE W REFLEX MICROSCOPIC  MAGNESIUM   BRAIN NATRIURETIC PEPTIDE  POC OCCULT BLOOD, ED  I-STAT CG4 LACTIC ACID, ED    EKG: None  Radiology: CT ABDOMEN PELVIS W CONTRAST Result Date: 10/16/2023 CLINICAL DATA:  Abdominal pain. Colon cancer. * Tracking Code: BO * EXAM: CT ABDOMEN AND PELVIS WITH CONTRAST TECHNIQUE: Multidetector CT imaging of the abdomen and pelvis was performed using the  standard protocol following bolus administration of intravenous contrast. RADIATION DOSE REDUCTION: This exam was performed according to the departmental dose-optimization program which includes automated exposure control, adjustment of the mA and/or kV according to patient size and/or use of iterative reconstruction technique. CONTRAST:  75mL OMNIPAQUE  IOHEXOL  350 MG/ML SOLN COMPARISON:  07/25/2023. FINDINGS: Lower chest: New minimal peripheral mucoid impaction in the posteromedial right lower lobe. Heart is at the upper limits of normal in size. Left ventricle appears dilated and possibly hypertrophied. No pericardial or pleural effusion. Distal esophagus is grossly unremarkable. Hepatobiliary: Liver is unremarkable. Cholecystectomy. No biliary ductal dilatation. Pancreas: Negative. Spleen: Negative. Adrenals/Urinary Tract: Adrenal glands are unremarkable. Probable right renal cysts. No specific follow-up necessary. Right ureter is decompressed. Left nephrectomy. Slight bladder wall thickening. Stomach/Bowel: Stomach is unremarkable. Duodenal diverticula. Small bowel is otherwise unremarkable. Right hemicolectomy. Colon is otherwise unremarkable. Vascular/Lymphatic: Atherosclerotic calcification of the aorta. 4 mm perirectal lymph node (3/64), stable. Otherwise, no pathologically enlarged lymph nodes. Reproductive: Prostate is visualized. Other: Small bilateral inguinal  hernias contain fat. No free fluid. Omental nodules measure up to 1.5 cm in the right lower quadrant (3/48). Musculoskeletal: Degenerative changes in the spine. Stable L1 compression fracture. IMPRESSION: 1. No acute findings to explain the patient's pain. 2. Omental metastatic disease in this patient with colon cancer. 3. Chronic mild bladder wall thickening. 4.  Aortic atherosclerosis (ICD10-I70.0). Electronically Signed   By: Newell Eke M.D.   On: 10/16/2023 18:36   DG Chest 2 View Result Date: 10/16/2023 CLINICAL DATA:  Shortness of breath, diaphoresis, nausea. Chest pain. EXAM: CHEST - 2 VIEW COMPARISON:  01/12/2023 and CT chest 08/22/2022. FINDINGS: Trachea is midline. Heart size normal. Thoracic aorta is calcified. A fiducial marker is seen in the right lower lobe where there is a vague nodular opacity, better seen on 08/22/2022. Lungs are otherwise clear. No pleural fluid. Dextroconvex scoliosis. Degenerative changes in the spine. IMPRESSION: 1. No acute findings. 2. Fiducial marker with associated minimal nodular opacification, better evaluated on CT chest 08/22/2022. Electronically Signed   By: Newell Eke M.D.   On: 10/16/2023 17:25    {Document cardiac monitor, telemetry assessment procedure when appropriate:32947} Procedures   Medications Ordered in the ED  iohexol  (OMNIPAQUE ) 350 MG/ML injection 75 mL (75 mLs Intravenous Contrast Given 10/16/23 1816)      {Click here for ABCD2, HEART and other calculators REFRESH Note before signing:1}                              Medical Decision Making Amount and/or Complexity of Data Reviewed Labs: ordered. Radiology: ordered.   This patient presents to the ED for concern of blood in stool and shortness of breath on exertion, this involves an extensive number of treatment options, and is a complaint that carries with it a high risk of complications and morbidity.  The differential diagnosis includes GI bleed, diverticulitis,  intra-abdominal infection, sepsis, COVID, flu, RSV   Co morbidities / Chronic conditions that complicate the patient evaluation Hypertension, OSA, CKD, CAD, HFrEF, A-fib   Additional history obtained:  Additional history obtained from EMR External records from outside source obtained and reviewed including cardiology note   Lab Tests:  I Ordered, and personally interpreted labs.  The pertinent results include: Leukocytosis at 16.5 with left shift, hypokalemia at 2.9, decreased CO2 at 18, elevated creatinine at 1.46 which is around baseline, anion gap at 17, elevated total bili at 2.6, troponin 43, 46,  magnesium  2.2,   Imaging Studies ordered:  I ordered imaging studies including CT abdomen pelvis with contrast I independently visualized and interpreted imaging which showed omental metastatic disease in this patient with colon cancer, chronic mild bladder wall thickening. I agree with the radiologist interpretation Chest x-ray which showed   Cardiac Monitoring: / EKG:  The patient was maintained on a cardiac monitor.  I personally viewed and interpreted the cardiac monitored which showed an underlying rhythm of: ***   Problem List / ED Course / Critical interventions / Medication management I ordered medication including potassium Reevaluation of the patient after these medicines showed that the patient *** I have reviewed the patients home medicines and have made adjustments as needed   Consultations Obtained:  I requested consultation with the ***,  and discussed lab and imaging findings as well as pertinent plan - they recommend: ***   Test / Admission - Considered:  ***   {Document critical care time when appropriate  Document review of labs and clinical decision tools ie CHADS2VASC2, etc  Document your independent review of radiology images and any outside records  Document your discussion with family members, caretakers and with consultants  Document social  determinants of health affecting pt's care  Document your decision making why or why not admission, treatments were needed:32947:::1}   Final diagnoses:  None    ED Discharge Orders     None

## 2023-10-16 NOTE — ED Triage Notes (Addendum)
 BIB EMS from home with sweating since last night. Reports some SHOB with exertion. Some mild nausea. Denies abd pain. Reports had some CP and dizziness 2 days ago. Also a mild dry cough.    105 glucose 144/90 86 HR

## 2023-10-17 LAB — I-STAT CG4 LACTIC ACID, ED
Lactic Acid, Venous: 1.5 mmol/L (ref 0.5–1.9)
Lactic Acid, Venous: 2.1 mmol/L (ref 0.5–1.9)

## 2023-10-17 LAB — PROTIME-INR
INR: 1 (ref 0.8–1.2)
Prothrombin Time: 13.7 s (ref 11.4–15.2)

## 2023-10-17 LAB — TROPONIN I (HIGH SENSITIVITY): Troponin I (High Sensitivity): 46 ng/L — ABNORMAL HIGH (ref <18)

## 2023-10-17 LAB — BRAIN NATRIURETIC PEPTIDE: B Natriuretic Peptide: 87.2 pg/mL (ref 0.0–100.0)

## 2023-10-17 LAB — POC OCCULT BLOOD, ED: Fecal Occult Bld: NEGATIVE

## 2023-10-17 MED ORDER — POTASSIUM CHLORIDE CRYS ER 20 MEQ PO TBCR: 40.0000 meq | EXTENDED_RELEASE_TABLET | Freq: Two times a day (BID) | ORAL | 0 refills | Status: AC

## 2023-10-17 MED ORDER — QUETIAPINE FUMARATE 25 MG PO TABS
25.0000 mg | ORAL_TABLET | ORAL | Status: AC
  Administered 2023-10-17: 25 mg via ORAL
  Filled 2023-10-17: qty 1

## 2023-10-17 MED ORDER — POTASSIUM CHLORIDE 20 MEQ PO PACK: 60.0000 meq | PACK | Freq: Once | ORAL | Status: DC

## 2023-10-17 MED ORDER — PREGABALIN 25 MG PO CAPS
150.0000 mg | ORAL_CAPSULE | ORAL | Status: AC
  Administered 2023-10-17: 150 mg via ORAL
  Filled 2023-10-17: qty 2

## 2023-10-17 NOTE — Discharge Instructions (Signed)
 Today you were seen for hyperkalemia.  Please follow-up with oncology regarding incidental finding on your CT.  Please pick up your potassium and take as prescribed.  Thank you for letting us  treat you today. After reviewing your labs and imaging, I feel you are safe to go home. Please follow up with your PCP in the next several days and provide them with your records from this visit. Return to the Emergency Room if pain becomes severe or symptoms worsen.

## 2023-10-24 DIAGNOSIS — N1831 Chronic kidney disease, stage 3a: Secondary | ICD-10-CM | POA: Diagnosis not present

## 2023-10-24 DIAGNOSIS — T148XXA Other injury of unspecified body region, initial encounter: Secondary | ICD-10-CM | POA: Diagnosis not present

## 2023-10-24 DIAGNOSIS — Z23 Encounter for immunization: Secondary | ICD-10-CM | POA: Diagnosis not present

## 2023-10-24 DIAGNOSIS — C189 Malignant neoplasm of colon, unspecified: Secondary | ICD-10-CM | POA: Diagnosis not present

## 2023-10-24 DIAGNOSIS — I480 Paroxysmal atrial fibrillation: Secondary | ICD-10-CM | POA: Diagnosis not present

## 2023-10-24 DIAGNOSIS — I5032 Chronic diastolic (congestive) heart failure: Secondary | ICD-10-CM | POA: Diagnosis not present

## 2023-10-24 DIAGNOSIS — E876 Hypokalemia: Secondary | ICD-10-CM | POA: Diagnosis not present

## 2023-10-24 DIAGNOSIS — K668 Other specified disorders of peritoneum: Secondary | ICD-10-CM | POA: Diagnosis not present

## 2023-10-24 DIAGNOSIS — I2510 Atherosclerotic heart disease of native coronary artery without angina pectoris: Secondary | ICD-10-CM | POA: Diagnosis not present

## 2023-10-25 ENCOUNTER — Other Ambulatory Visit (HOSPITAL_COMMUNITY): Payer: Self-pay | Admitting: Internal Medicine

## 2023-10-25 DIAGNOSIS — C189 Malignant neoplasm of colon, unspecified: Secondary | ICD-10-CM

## 2023-10-25 DIAGNOSIS — K668 Other specified disorders of peritoneum: Secondary | ICD-10-CM

## 2023-10-29 ENCOUNTER — Telehealth: Payer: Self-pay | Admitting: Oncology

## 2023-10-29 NOTE — Telephone Encounter (Signed)
 Amber called in to schedule [employer]. I correlated scheduling through secure chat with the [facility] scheduling team to have Donzel scheduled for 9/16 at 11am with Dr. Remus Harvey stated that no labs were needed. I asked Amber to Games developer with arrival instructions at the [facility] campus for Benen's appointment on 9/16.

## 2023-10-31 NOTE — Progress Notes (Unsigned)
 Cardiology Office Note   Date:  11/01/2023  ID:  Brendan Butler, DOB September 18, 1950, MRN [id number] PCP: Ransom Other, MD  [facility] Providers Cardiologist:  Dorn Lesches, MD   History of Present Illness Brendan Butler is a 73 y.o. male who with a past medical history of CAD status post multiple PCI's to the LAD (most recently in 2015), chronic HFrEF with a EF as low as 2025% on TEE 04/2022 but improved percent in 07/2022, persistent atrial flutter status post DCCV 04/2022 and again 05/2022 on Xarelto , sinus pauses, HTN, HLD, status post nephrectomy with CKD stage III, OSA, pulmonary nodules, GI bleeding required blood transfusion secondary to recent diagnosis of colon cancer status post right hemicolectomy on 08/23/2022, anxiety/depression who is here for follow-up appointment.  Patient was seen last year and has a long history of CAD with multiple interventions to the LAD dating back to 2001.  Admitted 12/2012 with anterior STEMI and underwent PCI with DES to LAD.  Presented back to the hospital 1 week later with another anterior STEMI complicated by ventricular fibrillation on the Cath Lab table requiring defibrillation.  Found to have acute in-stent thrombosis of recently placed LAD stent.  Intravascular ultrasound was performed in order to find the mechanism of stent thrombosis.  The previously implanted stent appeared well-expanded but there was an area of mall apposition in the distal stent.  Underwent successful PCI with balloon angioplasty of the entire stented segment with 0% residual stenosis.  Echo at that time showed LVEF 40 to 45% with hypokinesis of the anterior, anterior septal, apical segments consistent with LAD territory infarct.  Initially treated with IV amiodarone  given ventricular fibrillation but had no recurrence after revascularization so this was stopped.  Switch to Brilinta  and Effient  at that time.  Repeat LHC in 03/2013 for recurrent chest pain showed 70% in-stent  restenosis of prior LAD and underwent balloon angioplasty.  Last follow-up in our office in 2015.  Was seen in consultation in the hospital in 2017 for chest pain which was felt to be atypical.  Then in October 2023 for preop evaluation for lap chole in the setting of acute cholecystitis and multiple episodes of near syncope/syncope with bradycardia which was noted in the ED.  Echo at that time showed normalized EF 55 to 60% with hypokinesis of the entire anterior septum and grade 1 DD.  Follow-up acceptable risk for surgery.  Again lost follow-up.  Admitted March 2024 for altered mental status and delusional thoughts and was incidentally found to have new onset rapid atrial flutter.  Started on IV Cardizem  and Eliquis  and underwent successful TEE/DCCV on 05/10/2022 with return to normal sinus rhythm.  TEE showed newly reduced EF with 20 to 25% global hypokinesis.  He left AMA later that day for medications can be optimized.  However Effient  was switched to Plavix  given the need for Eliquis .  He was admitted again 05/20/2022 for worsening altered mental status and found to be back in a flutter with RVR had not been compliant with his anticoagulation.  Switch from Eliquis  to Xarelto  to help encourage compliance open started on IV amiodarone  and metoprolol .  Amio was discontinued due to concerns about long-term use in setting of noncompliance.  Remained to rate control A-fib without amiodarone  so the initial plan was to wait on repeat DCCV until he could improve compliance with anticoagulations as outpatient.  GDMT was adjusted.  He was noted to have some pauses on telemetry so wife ZIO was ordered and  plan was to discharge to SNF.  Unfortunately waiting for transportation to SNF he was noted to have a 4-second pause followed by a 5.8-second pause.  Therefore his discharge was canceled and beta-blocker was stopped.  EP was consulted and he was restarted on IV amiodarone  and then underwent repeat TEE/DCCV on 05/28/2022  with restoration of sinus rhythm.  EP recommended avoiding permanent pacemaker if at all possible given compliance issues.  Switch to p.o. amiodarone  with no recurrent pauses noted.  Of note, Plavix  was also stopped that admission.  Was then seen in April 2024 at which time was maintaining normal sinus rhythm and mental status had improved.  However he was having some lower extremity edema.  PCP prescribed Lasix  20 mg and had no improvement with this and increased the dose himself to 40 mg every other day.  Weight had been going up since discharge about 13 pounds.  BNP was normal and D-dimer was negative.  Lasix  was adjusted to 40 mg daily and echo was ordered to reassess LV function.  Readmitted 07/30/2018 4 through 6//24 for acute blood loss anemia after outpatient labs showed hemoglobin of 6.7.  Hemoccult was positive.  Transfuse 1 unit PRBCs, GI consulted and recommended colonoscopy but patient declined.  Hemoglobin improved to 7.7 prior to discharge and GI gave the okay to give him Xarelto  with close follow-up.  Iron levels were low so he was discharged after 1 dose of IV iron and then prescribed p.o. iron at discharge.  Continue to have lower extremity edema and BNP was again normal.  Lower extremity Dopplers are negative for DVT.  Given 1 dose of IV Lasix  and then put back on his home Lasix  dose.  Readmitted 08/17/2022 to 08/28/2022 after presenting with multiple complaints including worsening dyspnea, worsening lower extremity edema (right greater than left), dizziness, generalized malaise and low BP.  High-sensitivity troponin and D-dimer were negative.  BNP was normal.  Chest x-ray showed mild cardiac enlargement but no overt edema.  Venous Dopplers were negative for DVT in the right lower extremity.  ABIs were normal.  Echo showed LVEF of 50% with hypokinesis of the mid anterior septal segments, and basal inferior septal segment.  Admission labs were also significant for AKI with serum creatinine of 1.99  (baseline 1.6).  Ultimately admitted for symptomatic anemia and acute on chronic CHF was noted to be in sinus bradycardia on arrival with rates in the 40s to 50s.  Initial concern that this was playing a role in his symptoms.  Amiodarone  was decreased to 100 mg daily but symptoms were felt to be primarily due to anemia.  Xarelto  was held and he was transfused 1 unit PRBCs.  Started on IV Lasix  and transition to p.o.  GI was consulted and he underwent EGD/colonoscopy on 08/16/2022 which showed a likely malignant tumor at the hepatic flexure.  General surgery was consulted underwent right hemicolectomy with ileocolonic anastomosis on 08/23/2022.  Xarelto  was able to be restarted prior to discharge.  Net -5.5 L during this admission and discharge weight was 202 pounds.  Was seen in follow-up 09/03/2022 I was doing okay from a cardiac standpoint.  He endorsed a high sodium diet.  His biopsies were positive for invasive moderately differentiated adenocarcinoma that invades into the pericolonic soft tissue.  Resection margins were negative for carcinoma and negative for lymphovascular or perineural invasion.  The patient then was seen in the ED on 10/16/2023 for nausea, shortness of breath, and black stools x 2 days as  well as night sweats, nausea, vomiting, and increased shortness of breath with exertion.  He denied abdominal pain, urinary symptoms, hemoptysis, numbness, weakness, headache, or vision changes.  A CT abdomen pelvis was ordered as well as appropriate lab work.  He was found to be hypokalemic.  CT of the abdomen pelvis showed no acute findings to explain the patient's pain.  Omentum metastatic disease and the patient was seen.  Chronic mild bladder wall thickening.  Aortic atherosclerosis.  They considered for admission to monitor patient's vital signs and physical exam.  Patient symptoms are likely due to the hypokalemia and he was given a course of potassium.  Referred to heme-onc for further evaluation and  workup of incidental CT findings.  Discharged from the ED at that time.  Today, he presents with a hx of colon cancer and coronary artery disease who presents with gastrointestinal bleeding and abdominal pain. He was referred by an oncologist for further evaluation after a CT scan showed potential metastatic disease.  He experiences recurrent gastrointestinal bleeding with melena, leading to multiple hospital admissions. He recently presented with nausea, dyspnea, and melena. A CT scan revealed metastatic disease in the omentum. He manages daily morning abdominal pain with Pepto-Bismol and Tylenol .  He has coronary artery disease and was previously on Entresto . His heart function was last measured at 50%. He has no recent chest pain or arrhythmias. He is on Xarelto  for stroke prevention. His LDL cholesterol is 89.  He takes multiple medications, including Xarelto , totaling seventeen pills daily. His nephew assists with medication management. He experienced prolonged bleeding from a toenail injury due to Xarelto . Furosemide  causes peripheral edema, which resolves upon discontinuation. He has one kidney, which may be affected by medication use.  Reports no shortness of breath nor dyspnea on exertion. Reports no chest pain, pressure, or tightness. No edema, orthopnea, PND. Reports no palpitations.   Discussed the use of AI scribe software for clinical note transcription with the patient, who gave verbal consent to proceed.  ROS: Pertinent ROS in HPI  Studies Reviewed     Echocardiogram 08/17/2022 IMPRESSIONS     1. Left ventricular ejection fraction, by estimation, is 50%. The left  ventricle has low normal function. The left ventricle demonstrates  regional wall motion abnormalities (see scoring diagram/findings for  description). Left ventricular diastolic  parameters were grossly normal.   2. Right ventricular systolic function is normal. The right ventricular  size is normal. There is  normal pulmonary artery systolic pressure. The  estimated right ventricular systolic pressure is 20.8 mmHg.   3. The mitral valve is grossly normal. Trivial mitral valve  regurgitation.   4. The aortic valve is grossly normal. Aortic valve regurgitation is  trivial. No aortic stenosis is present.   5. The inferior vena cava is normal in size with greater than 50%  respiratory variability, suggesting right atrial pressure of 3 mmHg.       Physical Exam VS:  BP 120/68 (BP Location: Right Arm, Patient Position: Sitting, Cuff Size: Normal)   Pulse 70   Ht 5' 10 (1.778 m)   Wt 212 lb (96.2 kg)   SpO2 94%   BMI 30.42 kg/m        Wt Readings from Last 3 Encounters:  11/01/23 212 lb (96.2 kg)  10/16/23 200 lb (90.7 kg)  07/26/23 203 lb 4.2 oz (92.2 kg)    GEN: Well nourished, well developed in no acute distress NECK: No JVD; No carotid bruits CARDIAC: RRR, no murmurs,  rubs, gallops RESPIRATORY:  Clear to auscultation without rales, wheezing or rhonchi  ABDOMEN: Soft, non-tender, non-distended EXTREMITIES:  No edema; No deformity   ASSESSMENT AND PLAN  Metastatic omental disease secondary to colon cancer Metastatic disease identified in the omentum on recent CT scan. - Ensure follow-up with oncology on Monday for further evaluation and management.  Recurrent gastrointestinal bleeding Recent episode with melena. No acute findings on CT scan. Possible acid reflux contributing to symptoms. - Consider endoscopy if symptoms persist.  Chronic systolic heart failure Ejection fraction at 50%. Entresto  previously discontinued, possibly due to hypotension during acute bleeding episodes. Current blood pressure well-managed. - Re-evaluate the need for Entresto  at next visit when blood counts are stable.  Atrial flutter No recent episodes. On Xarelto  for stroke prevention.  Atherosclerotic heart disease of native coronary artery LDL at 89, above target of less than 70. - Increase  rosuvastatin  to 40 mg daily after current supply is used up. - Recheck lipid panel in 8 weeks. -no CP, defer ischemic w/u for now  Chronic kidney disease stage 3 Single kidney with CKD stage 3. Recent issues with furosemide  causing swelling, possibly straining the kidney. - Discontinue furosemide  due to adverse effects and lack of necessity.  Hyperlipidemia LDL cholesterol at 89, above target for coronary artery disease. - Increase rosuvastatin  to 40 mg daily after current supply is used up. - Recheck lipid panel in 8 weeks.  Lower extremity edema -euvolemic on exam -lasix  causes edema per patient report -hold lasix  for now      Dispo: He can see Dr. Court in 3 months  Signed, Orren LOISE Fabry, PA-C

## 2023-11-01 ENCOUNTER — Ambulatory Visit: Attending: Physician Assistant | Admitting: Physician Assistant

## 2023-11-01 VITALS — BP 120/68 | HR 70 | Ht 70.0 in | Wt 212.0 lb

## 2023-11-01 DIAGNOSIS — R600 Localized edema: Secondary | ICD-10-CM

## 2023-11-01 DIAGNOSIS — N183 Chronic kidney disease, stage 3 unspecified: Secondary | ICD-10-CM

## 2023-11-01 DIAGNOSIS — I5022 Chronic systolic (congestive) heart failure: Secondary | ICD-10-CM

## 2023-11-01 DIAGNOSIS — I4892 Unspecified atrial flutter: Secondary | ICD-10-CM | POA: Diagnosis not present

## 2023-11-01 DIAGNOSIS — I2510 Atherosclerotic heart disease of native coronary artery without angina pectoris: Secondary | ICD-10-CM

## 2023-11-01 DIAGNOSIS — E785 Hyperlipidemia, unspecified: Secondary | ICD-10-CM

## 2023-11-01 DIAGNOSIS — E876 Hypokalemia: Secondary | ICD-10-CM

## 2023-11-01 DIAGNOSIS — I10 Essential (primary) hypertension: Secondary | ICD-10-CM | POA: Diagnosis not present

## 2023-11-01 MED ORDER — ROSUVASTATIN CALCIUM 40 MG PO TABS: 40.0000 mg | ORAL_TABLET | Freq: Every day | ORAL | 3 refills | Status: DC

## 2023-11-01 NOTE — Patient Instructions (Signed)
 Medication Instructions:  Increase Crestor  40 mg take one tablet daily *If you need a refill on your cardiac medications before your next appointment, please call your pharmacy*  Lab Work: Lipids in 8 weeks (12/27/23) If you have labs (blood work) drawn today and your tests are completely normal, you will receive your results only by: MyChart Message (if you have MyChart) OR A paper copy in the mail If you have any lab test that is abnormal or we need to change your treatment, we will call you to review the results.  Follow-Up: At [facility], you and your health needs are our priority.  As part of our continuing mission to provide you with exceptional heart care, our providers are all part of one team.  This team includes your primary Cardiologist (physician) and Advanced Practice Providers or APPs (Physician Assistants and Nurse Practitioners) who all work together to provide you with the care you need, when you need it.  Your next appointment:   2-3 month(s)  Provider:   Dr Court

## 2023-11-02 ENCOUNTER — Other Ambulatory Visit: Payer: Self-pay | Admitting: Student

## 2023-11-06 ENCOUNTER — Encounter (HOSPITAL_COMMUNITY): Payer: Self-pay

## 2023-11-06 ENCOUNTER — Ambulatory Visit (HOSPITAL_COMMUNITY): Admission: RE | Admit: 2023-11-06 | Source: Ambulatory Visit

## 2023-11-12 ENCOUNTER — Encounter: Payer: Self-pay | Admitting: Oncology

## 2023-11-12 ENCOUNTER — Telehealth: Payer: Self-pay | Admitting: *Deleted

## 2023-11-12 ENCOUNTER — Other Ambulatory Visit (HOSPITAL_BASED_OUTPATIENT_CLINIC_OR_DEPARTMENT_OTHER): Payer: Self-pay

## 2023-11-12 ENCOUNTER — Inpatient Hospital Stay: Attending: Oncology | Admitting: Oncology

## 2023-11-12 VITALS — BP 147/59 | HR 63 | Temp 97.9°F | Resp 16 | Wt 215.1 lb

## 2023-11-12 DIAGNOSIS — C786 Secondary malignant neoplasm of retroperitoneum and peritoneum: Secondary | ICD-10-CM | POA: Insufficient documentation

## 2023-11-12 DIAGNOSIS — Z87891 Personal history of nicotine dependence: Secondary | ICD-10-CM | POA: Insufficient documentation

## 2023-11-12 DIAGNOSIS — I252 Old myocardial infarction: Secondary | ICD-10-CM | POA: Diagnosis not present

## 2023-11-12 DIAGNOSIS — Z7984 Long term (current) use of oral hypoglycemic drugs: Secondary | ICD-10-CM | POA: Insufficient documentation

## 2023-11-12 DIAGNOSIS — I480 Paroxysmal atrial fibrillation: Secondary | ICD-10-CM | POA: Insufficient documentation

## 2023-11-12 DIAGNOSIS — Z7902 Long term (current) use of antithrombotics/antiplatelets: Secondary | ICD-10-CM | POA: Insufficient documentation

## 2023-11-12 DIAGNOSIS — Z7901 Long term (current) use of anticoagulants: Secondary | ICD-10-CM | POA: Diagnosis not present

## 2023-11-12 DIAGNOSIS — C182 Malignant neoplasm of ascending colon: Secondary | ICD-10-CM | POA: Diagnosis not present

## 2023-11-12 DIAGNOSIS — I130 Hypertensive heart and chronic kidney disease with heart failure and stage 1 through stage 4 chronic kidney disease, or unspecified chronic kidney disease: Secondary | ICD-10-CM | POA: Diagnosis not present

## 2023-11-12 DIAGNOSIS — E1122 Type 2 diabetes mellitus with diabetic chronic kidney disease: Secondary | ICD-10-CM | POA: Insufficient documentation

## 2023-11-12 DIAGNOSIS — Z7952 Long term (current) use of systemic steroids: Secondary | ICD-10-CM | POA: Diagnosis not present

## 2023-11-12 DIAGNOSIS — C189 Malignant neoplasm of colon, unspecified: Secondary | ICD-10-CM

## 2023-11-12 DIAGNOSIS — Z905 Acquired absence of kidney: Secondary | ICD-10-CM | POA: Diagnosis not present

## 2023-11-12 DIAGNOSIS — N183 Chronic kidney disease, stage 3 unspecified: Secondary | ICD-10-CM | POA: Diagnosis not present

## 2023-11-12 DIAGNOSIS — I5042 Chronic combined systolic (congestive) and diastolic (congestive) heart failure: Secondary | ICD-10-CM | POA: Insufficient documentation

## 2023-11-12 DIAGNOSIS — D509 Iron deficiency anemia, unspecified: Secondary | ICD-10-CM | POA: Insufficient documentation

## 2023-11-12 DIAGNOSIS — Z79899 Other long term (current) drug therapy: Secondary | ICD-10-CM | POA: Diagnosis not present

## 2023-11-12 MED ORDER — ONDANSETRON HCL 8 MG PO TABS
8.0000 mg | ORAL_TABLET | Freq: Three times a day (TID) | ORAL | 1 refills | Status: DC | PRN
  Filled 2023-11-12: qty 30, 10d supply, fill #0

## 2023-11-12 MED ORDER — DEXAMETHASONE 4 MG PO TABS
8.0000 mg | ORAL_TABLET | Freq: Every day | ORAL | 5 refills | Status: DC
Start: 2023-11-12 — End: 2024-01-18
  Filled 2023-11-12: qty 8, 4d supply, fill #0

## 2023-11-12 MED ORDER — LIDOCAINE-PRILOCAINE 2.5-2.5 % EX CREA
TOPICAL_CREAM | CUTANEOUS | 3 refills | Status: DC
  Filled 2023-11-12: qty 30, 30d supply, fill #0

## 2023-11-12 MED ORDER — LOPERAMIDE HCL 2 MG PO CAPS
ORAL_CAPSULE | ORAL | 3 refills | Status: DC
  Filled 2023-11-12: qty 60, 8d supply, fill #0

## 2023-11-12 MED ORDER — PROCHLORPERAZINE MALEATE 10 MG PO TABS
10.0000 mg | ORAL_TABLET | Freq: Four times a day (QID) | ORAL | 1 refills | Status: DC | PRN
  Filled 2023-11-12: qty 30, 8d supply, fill #0

## 2023-11-12 NOTE — Progress Notes (Signed)
 START ON PATHWAY REGIMEN - Colorectal     A cycle is every 14 days:     Bevacizumab-xxxx      Irinotecan      Leucovorin      Fluorouracil      Fluorouracil   **Always confirm dose/schedule in your pharmacy ordering system**  Patient Characteristics: Distant Metastases, Nonsurgical Candidate, Non-KRAS G12C, RAS Mutation Positive/Unknown (BRAF V600 Wild-Type/Unknown), Standard Cytotoxic Therapy, First Line Standard Cytotoxic Therapy, Bevacizumab Eligible, PS = 0,1 Tumor Location: Colon Therapeutic Status: Distant Metastases Microsatellite/Mismatch Repair Status: MSS/pMMR BRAF Mutation Status: Awaiting Test Results KRAS/NRAS Mutation Status: Awaiting Test Results Preferred Therapy Approach: Standard Cytotoxic Therapy Standard Cytotoxic Line of Therapy: First Line Standard Cytotoxic Therapy ECOG Performance Status: 1 Bevacizumab Eligibility: Eligible Intent of Therapy: Non-Curative / Palliative Intent, Discussed with Patient

## 2023-11-12 NOTE — Assessment & Plan Note (Signed)
 Please review oncology history for details and timeline of events.    Initial diagnosis in June 2024 and underwent right hemicolectomy and it was stage II at the time with negative margins.  Patient did not seek oncology care at that time despite multiple appointments.  Unfortunately omental metastasis identified on recent CT scan on 10/16/2023. The cancer is not amenable to surgical resection due to its location and extent. Chemotherapy is the recommended treatment to control the disease and prevent further spread. The cancer is not curable but treatable, with the aim to extend life expectancy and improve quality of life. Without treatment, prognosis is poor with survival not expected beyond six months. With chemotherapy, survival may be extended by one to two years.  Today discussed staging, prognosis, plan of care, treatment options.  Reviewed NCCN guidelines.  Discussed chemotherapy options.  He has pre-existing neuropathy which does affect his quality of life.  Hence we will not use oxaliplatin and I proposed treating with FOLFIRI plus bevacizumab.  Side effects include fatigue, nausea, diarrhea, tingling, numbness, and potential cytopenias.  We will arrange for formal chemo education.  Will also arrange for Port-A-Cath placement in preparation for chemotherapy.  Patient is undecided about proceeding with chemotherapy at this time.  He will make the addition after the chemo education session.  Request admitted for staging CT of the chest to complete workup.  We will obtain Guardant tissue testing and also Guardant360 with next set of labs to look for any actionable mutations.  - Emphasized the importance of treatment to delay disease progression.  - He expressed a desire to think about the treatment options before proceeding.  Once he can inform his decision, will arrange appointments appropriately for chemotherapy.

## 2023-11-12 NOTE — Telephone Encounter (Signed)
 Left voicemail on daughter Jennifer's phone to call and discuss upcoming appointments in preparation of starting chemo

## 2023-11-12 NOTE — Progress Notes (Signed)
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 lymph nodes. Reproductive: Prostate is visualized. Other: Small bilateral inguinal hernias contain fat. No free fluid. Omental nodules measure up to 1.5 cm in the right lower quadrant (3/48). Musculoskeletal: Degenerative changes in the spine. Stable L1 compression fracture. IMPRESSION: 1. No acute findings to explain the patient's pain. 2. Omental metastatic disease in this patient with colon cancer. 3. Chronic mild bladder wall thickening. 4.  Aortic atherosclerosis (ICD10-I70.0). Electronically Signed   By: Newell Eke M.D.   On: 10/16/2023 18:36   DG Chest 2 View Result Date: 10/16/2023 CLINICAL DATA:  Shortness of breath, diaphoresis, nausea. Chest pain. EXAM: CHEST - 2 VIEW COMPARISON:  01/12/2023 and CT chest 08/22/2022. FINDINGS: Trachea is midline. Heart size normal. Thoracic aorta is calcified. A fiducial marker is seen in the right lower lobe where there is a vague nodular opacity, better seen on 08/22/2022. Lungs are otherwise clear. No pleural fluid. Dextroconvex scoliosis. Degenerative changes in the spine. IMPRESSION: 1. No acute findings. 2. Fiducial marker with associated minimal nodular  opacification, better evaluated on CT chest 08/22/2022. Electronically Signed   By: Newell Eke M.D.   On: 10/16/2023 17:25    Orders Placed This Encounter  Procedures   Consent Attestation for Oncology Treatment    The patient is informed of risks, benefits, side-effects of the prescribed oncology treatment. Potential short term and long term side effects and response rates discussed. After a long discussion, the patient made informed decision to proceed.:   Yes   CT Chest Wo Contrast    Standing Status:   Future    Expected Date:   11/15/2023    Expiration Date:   11/11/2024    Preferred imaging location?:   [facility]   CBC with Differential (Cancer Center Only)    Standing Status:   Future    Expiration Date:   11/11/2024   CMP (Cancer Center only)    Standing Status:   Future    Expiration Date:   11/11/2024   Lactate dehydrogenase    Standing Status:   Future    Expiration Date:   11/11/2024   Iron and TIBC    Standing Status:   Future    Expiration Date:   11/11/2024   Ferritin    Standing Status:   Future    Expiration Date:   11/11/2024   CEA (Access)    Standing Status:   Future    Expiration Date:   11/11/2024   Guardant 360    Standing Status:   Future    Expiration Date:   11/11/2024   CBC with Differential (Cancer Center Only)    Standing Status:   Future    Expected Date:   11/26/2023    Expiration Date:   11/25/2024   CMP (Cancer Center only)    Standing Status:   Future    Expected Date:   11/26/2023    Expiration Date:   11/25/2024   Total Protein, Urine dipstick    Standing Status:   Future    Expected Date:   11/26/2023    Expiration Date:  11/25/2024   CBC with Differential (Cancer Center Only)    Standing Status:   Future    Expected Date:   12/10/2023    Expiration Date:   12/09/2024   CMP (Cancer Center only)    Standing Status:   Future    Expected Date:   12/10/2023    Expiration Date:   12/09/2024   PHYSICIAN COMMUNICATION ORDER    UA  Protein, Dipstick required prior to every 3rd cycle of bevacizumab.    CODE STATUS:  Code Status History     Date Active Date Inactive Code Status Order ID Comments User Context   07/25/2023 2036 07/26/2023 2056 Full Code [id number]  Hilma Rankins, MD ED   01/12/2023 2154 01/15/2023 2141 Full Code [id number]  Charlton Evalene RAMAN, MD ED   10/09/2022 1554 10/12/2022 2138 Full Code [id number]  Claudene Maximino LABOR, MD ED   09/27/2022 1658 09/30/2022 2040 Full Code [id number]  Barbarann Nest, MD ED   09/11/2022 0055 09/13/2022 1836 Full Code [id number]  Charlton Evalene RAMAN, MD ED   08/17/2022 1333 08/28/2022 2137 Full Code [id number]  Seena Marsa NOVAK, MD ED   07/30/2022 1808 07/31/2022 2352 Full Code [id number]  Cindy Garnette POUR, MD ED   05/20/2022 2225 05/29/2022 1659 Full Code [id number]  Dena Charleston, MD ED   05/06/2022 2144 05/11/2022 0023 Full Code [id number]  Laveda Roosevelt, MD ED   12/25/2021 1007 12/28/2021 2333 Full Code [id number]  Barbarann Nest, MD ED   01/16/2021 2024 01/17/2021 2157 Full Code [id number]  Ricky Alfrieda DASEN, DO ED   01/27/2016 1907 01/28/2016 1618 Full Code [id number]  Alfornia Madison, MD Inpatient   04/22/2013 1039 04/23/2013 1717 Full Code [id number]  Burnard Debby LABOR, MD Inpatient   04/21/2013 1533 04/22/2013 1039 Full Code [id number]  Maxie Herlene POUR, PA-C ED   01/05/2013 1508 01/08/2013 1617 Full Code [id number]  Wonda Sharper, MD Inpatient   12/30/2012 0017 01/01/2013 1613 Full Code [id number]  Lillie Herbert, MD Inpatient    Questions for Most Recent Historical Code Status (Order [id number])     Question Answer   By: Consent: discussion documented in EHR                Advance Directive Documentation    Flowsheet Row Most Recent Value  Type of Advance Directive Out of facility DNR (pink MOST or yellow form)  Pre-existing out of facility DNR order (yellow form or pink MOST form) --  MOST Form in Place? --    Future Appointments  Date Time Provider Department Center  01/06/2024  9:45 AM Court Dorn PARAS, MD [facility]     I spent a total of 70 minutes during this encounter with the patient including review of chart and various tests results, discussions about plan of care and coordination of care plan.  This document was completed utilizing speech recognition software. Grammatical errors, random word insertions, pronoun errors, and incomplete sentences are an occasional consequence of this system due to software limitations, ambient noise, and hardware issues. Any formal questions or concerns about the content, text or information contained within the body of this dictation should be directly addressed to the provider for clarification.

## 2023-11-13 ENCOUNTER — Other Ambulatory Visit: Payer: Self-pay

## 2023-11-13 ENCOUNTER — Telehealth: Payer: Self-pay | Admitting: *Deleted

## 2023-11-13 NOTE — Telephone Encounter (Signed)
 Called pt numbers and left voicemail for return call to discuss scheduling of needed upcoming appts

## 2023-11-15 ENCOUNTER — Telehealth: Payer: Self-pay | Admitting: Oncology

## 2023-11-19 NOTE — Progress Notes (Signed)
 Pharmacist Chemotherapy Monitoring - Initial Assessment    Anticipated start date: 11/26/23   The following has been reviewed per standard work regarding the patient's treatment regimen: The patient's diagnosis, treatment plan and drug doses, and organ/hematologic function Lab orders and baseline tests specific to treatment regimen  The treatment plan start date, drug sequencing, and pre-medications Prior authorization status  Patient's documented medication list, including drug-drug interaction screen and prescriptions for anti-emetics and supportive care specific to the treatment regimen The drug concentrations, fluid compatibility, administration routes, and timing of the medications to be used The patient's access for treatment and lifetime cumulative dose history, if applicable  The patient's medication allergies and previous infusion related reactions, if applicable   Changes made to treatment plan:  N/A  Follow up needed:  N/A   Brendan Butler, RPH, 11/19/2023  2:10 PM

## 2023-11-20 ENCOUNTER — Other Ambulatory Visit: Payer: Self-pay

## 2023-11-20 ENCOUNTER — Emergency Department (HOSPITAL_BASED_OUTPATIENT_CLINIC_OR_DEPARTMENT_OTHER)

## 2023-11-20 ENCOUNTER — Inpatient Hospital Stay

## 2023-11-20 ENCOUNTER — Emergency Department (HOSPITAL_BASED_OUTPATIENT_CLINIC_OR_DEPARTMENT_OTHER)
Admission: EM | Admit: 2023-11-20 | Discharge: 2023-11-20 | Disposition: A | Discharge status: Home / Self Care | Source: Ambulatory Visit | Attending: Emergency Medicine | Admitting: Emergency Medicine

## 2023-11-20 DIAGNOSIS — Z85038 Personal history of other malignant neoplasm of large intestine: Secondary | ICD-10-CM | POA: Diagnosis not present

## 2023-11-20 DIAGNOSIS — Z7901 Long term (current) use of anticoagulants: Secondary | ICD-10-CM | POA: Diagnosis not present

## 2023-11-20 DIAGNOSIS — R059 Cough, unspecified: Secondary | ICD-10-CM | POA: Diagnosis not present

## 2023-11-20 DIAGNOSIS — C189 Malignant neoplasm of colon, unspecified: Secondary | ICD-10-CM

## 2023-11-20 DIAGNOSIS — N281 Cyst of kidney, acquired: Secondary | ICD-10-CM | POA: Diagnosis not present

## 2023-11-20 DIAGNOSIS — R42 Dizziness and giddiness: Secondary | ICD-10-CM | POA: Diagnosis not present

## 2023-11-20 DIAGNOSIS — I509 Heart failure, unspecified: Secondary | ICD-10-CM | POA: Diagnosis not present

## 2023-11-20 DIAGNOSIS — N189 Chronic kidney disease, unspecified: Secondary | ICD-10-CM | POA: Diagnosis not present

## 2023-11-20 DIAGNOSIS — K5730 Diverticulosis of large intestine without perforation or abscess without bleeding: Secondary | ICD-10-CM | POA: Diagnosis not present

## 2023-11-20 DIAGNOSIS — R531 Weakness: Secondary | ICD-10-CM | POA: Diagnosis not present

## 2023-11-20 DIAGNOSIS — R112 Nausea with vomiting, unspecified: Secondary | ICD-10-CM | POA: Insufficient documentation

## 2023-11-20 DIAGNOSIS — R918 Other nonspecific abnormal finding of lung field: Secondary | ICD-10-CM | POA: Diagnosis not present

## 2023-11-20 DIAGNOSIS — I4891 Unspecified atrial fibrillation: Secondary | ICD-10-CM | POA: Insufficient documentation

## 2023-11-20 DIAGNOSIS — R591 Generalized enlarged lymph nodes: Secondary | ICD-10-CM | POA: Diagnosis not present

## 2023-11-20 DIAGNOSIS — J189 Pneumonia, unspecified organism: Secondary | ICD-10-CM | POA: Diagnosis not present

## 2023-11-20 DIAGNOSIS — Z905 Acquired absence of kidney: Secondary | ICD-10-CM | POA: Diagnosis not present

## 2023-11-20 DIAGNOSIS — I7090 Unspecified atherosclerosis: Secondary | ICD-10-CM | POA: Diagnosis not present

## 2023-11-20 LAB — CBC WITH DIFFERENTIAL (CANCER CENTER ONLY)
Abs Immature Granulocytes: 0.06 K/uL (ref 0.00–0.07)
Basophils Absolute: 0.1 K/uL (ref 0.0–0.1)
Basophils Relative: 1 %
Eosinophils Absolute: 0.5 K/uL (ref 0.0–0.5)
Eosinophils Relative: 3 %
HCT: 49.5 % (ref 39.0–52.0)
Hemoglobin: 16.7 g/dL (ref 13.0–17.0)
Immature Granulocytes: 0 %
Lymphocytes Relative: 23 %
Lymphs Abs: 3.9 K/uL (ref 0.7–4.0)
MCH: 29.1 pg (ref 26.0–34.0)
MCHC: 33.7 g/dL (ref 30.0–36.0)
MCV: 86.4 fL (ref 80.0–100.0)
Monocytes Absolute: 1.9 K/uL — ABNORMAL HIGH (ref 0.1–1.0)
Monocytes Relative: 11 %
Neutro Abs: 10.4 K/uL — ABNORMAL HIGH (ref 1.7–7.7)
Neutrophils Relative %: 62 %
Platelet Count: 290 K/uL (ref 150–400)
RBC: 5.73 MIL/uL (ref 4.22–5.81)
RDW: 12.9 % (ref 11.5–15.5)
WBC Count: 17.2 K/uL — ABNORMAL HIGH (ref 4.0–10.5)
nRBC: 0 % (ref 0.0–0.2)

## 2023-11-20 LAB — CMP (CANCER CENTER ONLY)
ALT: 9 U/L (ref 0–44)
AST: 18 U/L (ref 15–41)
Albumin: 4.5 g/dL (ref 3.5–5.0)
Alkaline Phosphatase: 109 U/L (ref 38–126)
Anion gap: 17 — ABNORMAL HIGH (ref 5–15)
BUN: 12 mg/dL (ref 8–23)
CO2: 23 mmol/L (ref 22–32)
Calcium: 9.7 mg/dL (ref 8.9–10.3)
Chloride: 102 mmol/L (ref 98–111)
Creatinine: 1.25 mg/dL — ABNORMAL HIGH (ref 0.61–1.24)
GFR, Estimated: >60 mL/min (ref >60)
Glucose, Bld: 119 mg/dL — ABNORMAL HIGH (ref 70–99)
Potassium: 3 mmol/L — ABNORMAL LOW (ref 3.5–5.1)
Sodium: 142 mmol/L (ref 135–145)
Total Bilirubin: 1.6 mg/dL — ABNORMAL HIGH (ref 0.0–1.2)
Total Protein: 7.7 g/dL (ref 6.5–8.1)

## 2023-11-20 LAB — URINALYSIS, ROUTINE W REFLEX MICROSCOPIC
Bacteria, UA: NONE SEEN
Bilirubin Urine: NEGATIVE
Glucose, UA: >1000 mg/dL — AB
Hgb urine dipstick: NEGATIVE
Ketones, ur: NEGATIVE mg/dL
Leukocytes,Ua: NEGATIVE
Nitrite: NEGATIVE
Specific Gravity, Urine: 1.04 — ABNORMAL HIGH (ref 1.005–1.030)
pH: 6.5 (ref 5.0–8.0)

## 2023-11-20 LAB — CBC WITH DIFFERENTIAL/PLATELET
Abs Immature Granulocytes: 0.06 K/uL (ref 0.00–0.07)
Basophils Absolute: 0.1 K/uL (ref 0.0–0.1)
Basophils Relative: 0 %
Eosinophils Absolute: 0.4 K/uL (ref 0.0–0.5)
Eosinophils Relative: 3 %
HCT: 46.9 % (ref 39.0–52.0)
Hemoglobin: 15.8 g/dL (ref 13.0–17.0)
Immature Granulocytes: 0 %
Lymphocytes Relative: 22 %
Lymphs Abs: 3.4 K/uL (ref 0.7–4.0)
MCH: 29 pg (ref 26.0–34.0)
MCHC: 33.7 g/dL (ref 30.0–36.0)
MCV: 86.2 fL (ref 80.0–100.0)
Monocytes Absolute: 1.7 K/uL — ABNORMAL HIGH (ref 0.1–1.0)
Monocytes Relative: 11 %
Neutro Abs: 10 K/uL — ABNORMAL HIGH (ref 1.7–7.7)
Neutrophils Relative %: 64 %
Platelets: 243 K/uL (ref 150–400)
RBC: 5.44 MIL/uL (ref 4.22–5.81)
RDW: 12.9 % (ref 11.5–15.5)
WBC: 15.6 K/uL — ABNORMAL HIGH (ref 4.0–10.5)
nRBC: 0 % (ref 0.0–0.2)

## 2023-11-20 LAB — LIPASE, BLOOD: Lipase: 39 U/L (ref 11–51)

## 2023-11-20 LAB — COMPREHENSIVE METABOLIC PANEL WITH GFR
ALT: 9 U/L (ref 0–44)
AST: 16 U/L (ref 15–41)
Albumin: 4.4 g/dL (ref 3.5–5.0)
Alkaline Phosphatase: 100 U/L (ref 38–126)
Anion gap: 14 (ref 5–15)
BUN: 12 mg/dL (ref 8–23)
CO2: 26 mmol/L (ref 22–32)
Calcium: 9.5 mg/dL (ref 8.9–10.3)
Chloride: 102 mmol/L (ref 98–111)
Creatinine, Ser: 1.23 mg/dL (ref 0.61–1.24)
GFR, Estimated: >60 mL/min (ref >60)
Glucose, Bld: 100 mg/dL — ABNORMAL HIGH (ref 70–99)
Potassium: 3.2 mmol/L — ABNORMAL LOW (ref 3.5–5.1)
Sodium: 142 mmol/L (ref 135–145)
Total Bilirubin: 1.4 mg/dL — ABNORMAL HIGH (ref 0.0–1.2)
Total Protein: 7.2 g/dL (ref 6.5–8.1)

## 2023-11-20 LAB — RESP PANEL BY RT-PCR (RSV, FLU A&B, COVID)  RVPGX2
Influenza A by PCR: NEGATIVE
Influenza B by PCR: NEGATIVE
Resp Syncytial Virus by PCR: NEGATIVE
SARS Coronavirus 2 by RT PCR: NEGATIVE

## 2023-11-20 LAB — LACTATE DEHYDROGENASE: LDH: 226 U/L — ABNORMAL HIGH (ref 98–192)

## 2023-11-20 LAB — IRON AND TIBC
Iron: 60 ug/dL (ref 45–182)
Saturation Ratios: 20 % (ref 17.9–39.5)
TIBC: 308 ug/dL (ref 250–450)
UIBC: 248 ug/dL

## 2023-11-20 LAB — TROPONIN T, HIGH SENSITIVITY
Troponin T High Sensitivity: 22 ng/L — ABNORMAL HIGH (ref 0–19)
Troponin T High Sensitivity: 20 ng/L — ABNORMAL HIGH (ref 0–19)

## 2023-11-20 LAB — CEA (ACCESS): CEA (CHCC): 8.03 ng/mL — ABNORMAL HIGH (ref 0.00–5.00)

## 2023-11-20 LAB — FERRITIN: Ferritin: 564 ng/mL — ABNORMAL HIGH (ref 24–336)

## 2023-11-20 LAB — CBG MONITORING, ED: Glucose-Capillary: 114 mg/dL — ABNORMAL HIGH (ref 70–99)

## 2023-11-20 MED ORDER — ONDANSETRON 4 MG PO TBDP: 4.0000 mg | ORAL_TABLET | Freq: Three times a day (TID) | ORAL | 0 refills | Status: DC | PRN

## 2023-11-20 MED ORDER — IOHEXOL 350 MG/ML SOLN
75.0000 mL | Freq: Once | INTRAVENOUS | Status: AC | PRN
  Administered 2023-11-20: 52 mL via INTRAVENOUS

## 2023-11-20 MED ORDER — LACTATED RINGERS IV BOLUS
1000.0000 mL | Freq: Once | INTRAVENOUS | Status: AC
  Administered 2023-11-20: 1000 mL via INTRAVENOUS

## 2023-11-20 MED ORDER — DOXYCYCLINE HYCLATE 100 MG PO CAPS: 100.0000 mg | ORAL_CAPSULE | Freq: Two times a day (BID) | ORAL | 0 refills | Status: DC

## 2023-11-20 MED ORDER — ONDANSETRON HCL 4 MG/2ML IJ SOLN
4.0000 mg | Freq: Once | INTRAMUSCULAR | Status: DC
Start: 2023-11-20 — End: 2023-11-21
  Filled 2023-11-20: qty 2

## 2023-11-20 MED ORDER — DOXYCYCLINE HYCLATE 100 MG PO TABS
100.0000 mg | ORAL_TABLET | Freq: Once | ORAL | Status: AC
  Administered 2023-11-20: 100 mg via ORAL
  Filled 2023-11-20: qty 1

## 2023-11-20 MED ORDER — AMOXICILLIN-POT CLAVULANATE 875-125 MG PO TABS
1.0000 | ORAL_TABLET | Freq: Once | ORAL | Status: AC
  Administered 2023-11-20: 1 via ORAL
  Filled 2023-11-20: qty 1

## 2023-11-20 MED ORDER — AMOXICILLIN-POT CLAVULANATE 875-125 MG PO TABS: 1.0000 | ORAL_TABLET | Freq: Two times a day (BID) | ORAL | 0 refills | Status: AC

## 2023-11-20 MED ORDER — IOHEXOL 300 MG/ML  SOLN
100.0000 mL | Freq: Once | INTRAMUSCULAR | Status: AC | PRN
  Administered 2023-11-20: 100 mL via INTRAVENOUS

## 2023-11-20 NOTE — Discharge Instructions (Addendum)
 Overall I am going to treat you for possible lung infection with antibiotics.  However I do suspect that may be changes in your lung are likely secondary to metastasis from your known colon cancer.  Follow-up closely with your primary care doctor and oncology team.  Please return if you develop worsening symptoms especially fever greater than 100.4.  Continue Zofran  as needed for nausea.  Take next dose of antibiotic tomorrow morning.

## 2023-11-20 NOTE — ED Triage Notes (Signed)
 Pt caox4 reporting he was told to come to ED from cancer center d/t sudden onset of diaphoresis, pt c/o weakness when not laying down.

## 2023-11-20 NOTE — ED Provider Notes (Signed)
 Overall lab work unremarkable.  Patient here with generalized weakness.  Known colon cancer not started any treatment.  He had a CT scan of the abdomen pelvis that was unremarkable but did show maybe new metastasis or some sort of process going on in the lungs.  CT scan of the lungs were obtained that showed no evidence of PE but did show pneumonia versus metastasis.  Clinically he is not having any fever cough or sputum production but will conservatively treat outpatient for possible pneumonia.  But I do think likely metastasis.  Troponins were flat at 21 and 20.  He is not having any chest pain.  Doubt ACS.  CT scan of the abdomen and pelvis is overall unchanged.  Overall we will follow-up with primary care doctor and oncology.  He understands return if symptoms worsen.  Patient discharged.  No concern for sepsis at this time.  This chart was dictated using voice recognition software.  Despite best efforts to proofread,  errors can occur which can change the documentation meaning.    Ruthe Cornet, DO 11/20/23 [id number]

## 2023-11-20 NOTE — ED Provider Notes (Signed)
 [facility] AT [facility] Provider Note   CSN: [id number] Arrival date & time: 11/20/23  1227     Patient presents with: Weakness   Brendan Butler is a 73 y.o. male.   73 year old male history of stage IV colon cancer, CKD, CHF, and atrial fibrillation on Xarelto  presents to the emergency department generalized weakness.  Patient reports that last night he started feeling poorly.  Has developed some nausea and vomiting.  Has some diarrhea but has not had a bowel movement today and has not been passing gas.  Denies any abdominal pain.  Went to the cancer center for an appointment and had sudden onset of diaphoresis lightheadedness and was told that his blood pressure was abnormal and referred to the emergency department.  Currently denies any chest pain, shortness of breath, abdominal pain.  Says he has had some chills and a very mild cough.       Prior to Admission medications   Medication Sig Start Date End Date Taking? Authorizing Provider  amiodarone  (PACERONE ) 100 MG tablet Take 1 tablet (100 mg total) by mouth daily. 11/04/23   Lucien Orren SAILOR, PA-C  buPROPion  (WELLBUTRIN  XL) 150 MG 24 hr tablet Take 3 tablets (450 mg total) by mouth daily. 07/26/23   Alexander, Natalie, DO  Cholecalciferol (VITAMIN D-3 PO) Take 1 capsule by mouth daily.    [provider]  dexamethasone  (DECADRON ) 4 MG tablet Take 2 tablets (8 mg total) by mouth daily. Start the day after chemotherapy for 2 days. Take with food. 11/12/23   Pasam, Chinita, MD  empagliflozin  (JARDIANCE ) 10 MG TABS tablet Take 1 tablet (10 mg total) by mouth daily. 06/18/22   Goodrich, Callie E, PA-C  fluticasone  (FLONASE ) 50 MCG/ACT nasal spray Place 2 sprays into both nostrils daily. 06/14/23   [provider]  furosemide  (LASIX ) 40 MG tablet Take 1 tablet (40 mg total) by mouth daily as needed for edema or fluid. Patient not taking: Reported on 11/12/2023 07/26/23   Alexander, Natalie, DO   lactose free nutrition (BOOST PLUS) LIQD Take 237 mLs by mouth 2 (two) times daily between meals. Patient taking differently: Take 237 mLs by mouth 2 (two) times daily between meals. Takes when does not feel like eating 08/29/22   Swayze, Ava, DO  lidocaine -prilocaine  (EMLA ) cream Apply to affected area once. 11/12/23   Pasam, Chinita, MD  loperamide  (IMODIUM ) 2 MG capsule Take 2 tablets by mouth with first loose stool, then 1 tablet with each additional loose stool as needed. Do not exceed 8 tabs in a 24-hour period 11/12/23   Pasam, Avinash, MD  methocarbamol  (ROBAXIN ) 500 MG tablet TAKE 1 TABLET BY MOUTH TWICE A DAY AS NEEDED FOR 30 DAYS; Duration: 30    [provider]  Multiple Vitamin (MULTIVITAMIN WITH MINERALS) TABS tablet Take 1 tablet by mouth daily. Patient not taking: Reported on 11/12/2023 08/29/22   Swayze, Ava, DO  nitroGLYCERIN  (NITROSTAT ) 0.4 MG SL tablet Place 0.4 mg under the tongue every 5 (five) minutes as needed for chest pain. Patient not taking: Reported on 11/12/2023 01/08/23   [provider]  ondansetron  (ZOFRAN ) 8 MG tablet Take 1 tablet (8 mg total) by mouth every 8 (eight) hours as needed for nausea, vomiting or refractory nausea / vomiting. Start on the third day after chemotherapy. 11/12/23   Pasam, Chinita, MD  PARoxetine  (PAXIL ) 40 MG tablet Take 40 mg by mouth daily.    [provider]  potassium chloride  SA (KLOR-CON   M) 20 MEQ tablet Take 2 tablets (40 mEq total) by mouth 2 (two) times daily for 5 days. Patient not taking: Reported on 11/12/2023 10/17/23 11/01/23  Francis Ileana SAILOR, PA-C  prasugrel  (EFFIENT ) 10 MG TABS tablet Take 10 mg by mouth daily. 09/12/22   [provider]  pregabalin  (LYRICA ) 150 MG capsule Take 1 capsule (150 mg total) by mouth 2 (two) times daily. 05/24/22   Elgergawy, Brayton RAMAN, MD  prochlorperazine  (COMPAZINE ) 10 MG tablet Take 1 tablet (10 mg total) by mouth every 6 (six) hours as needed for nausea or vomiting. 11/12/23    Pasam, Chinita, MD  QUEtiapine  (SEROQUEL ) 25 MG tablet Take 1 tablet (25 mg total) by mouth at bedtime. 05/10/22   Pokhrel, Vernal, MD  Rivaroxaban  (XARELTO ) 15 MG TABS tablet Take 1 tablet (15 mg total) by mouth daily. 07/27/23   Alexander, Natalie, DO  rosuvastatin  (CRESTOR ) 40 MG tablet Take 1 tablet (40 mg total) by mouth daily. 11/01/23 01/30/24  Lucien Orren SAILOR, PA-C  sacubitril -valsartan  (ENTRESTO ) 24-26 MG TAKE 1 TABLET TWICE DAILY 06/04/23   Nishan, Peter C, MD    Allergies: Levitra [vardenafil], Asa [aspirin ], and Lipitor  [atorvastatin ]    Review of Systems  Updated Vital Signs BP (!) 158/120 (BP Location: Right Arm)   Pulse 91   Temp 98 F (36.7 C) (Oral)   Resp 16   SpO2 97%   Physical Exam Vitals and nursing note reviewed.  Constitutional:      General: He is not in acute distress.    Appearance: He is well-developed. He is diaphoretic. He is not ill-appearing.  HENT:     Head: Normocephalic and atraumatic.     Right Ear: External ear normal.     Left Ear: External ear normal.     Nose: Nose normal.  Eyes:     Extraocular Movements: Extraocular movements intact.     Conjunctiva/sclera: Conjunctivae normal.     Pupils: Pupils are equal, round, and reactive to light.  Cardiovascular:     Rate and Rhythm: Normal rate and regular rhythm.     Heart sounds: Normal heart sounds.  Pulmonary:     Effort: Pulmonary effort is normal. No respiratory distress.     Breath sounds: Normal breath sounds.  Abdominal:     General: There is no distension.     Palpations: Abdomen is soft. There is no mass.     Tenderness: There is no abdominal tenderness. There is no guarding.  Musculoskeletal:     Cervical back: Normal range of motion and neck supple.     Right lower leg: No edema.     Left lower leg: No edema.  Skin:    General: Skin is warm.  Neurological:     Mental Status: He is alert. Mental status is at baseline.  Psychiatric:        Mood and Affect: Mood normal.         Behavior: Behavior normal.     (all labs ordered are listed, but only abnormal results are displayed) Labs Reviewed  COMPREHENSIVE METABOLIC PANEL WITH GFR - Abnormal; Notable for the following components:      Result Value   Potassium 3.2 (*)    Glucose, Bld 100 (*)    Total Bilirubin 1.4 (*)    All other components within normal limits  CBC WITH DIFFERENTIAL/PLATELET - Abnormal; Notable for the following components:   WBC 15.6 (*)    Neutro Abs 10.0 (*)    Monocytes Absolute  1.7 (*)    All other components within normal limits  CBG MONITORING, ED - Abnormal; Notable for the following components:   Glucose-Capillary 114 (*)    All other components within normal limits  TROPONIN T, HIGH SENSITIVITY - Abnormal; Notable for the following components:   Troponin T High Sensitivity 22 (*)    All other components within normal limits  RESP PANEL BY RT-PCR (RSV, FLU A&B, COVID)  RVPGX2  LIPASE, BLOOD  URINALYSIS, ROUTINE W REFLEX MICROSCOPIC  TROPONIN T, HIGH SENSITIVITY    EKG: None  Radiology: CT ABDOMEN PELVIS W CONTRAST Result Date: 11/20/2023 CLINICAL DATA:  Nausea, vomiting, weakness, history of colon cancer EXAM: CT ABDOMEN AND PELVIS WITH CONTRAST TECHNIQUE: Multidetector CT imaging of the abdomen and pelvis was performed using the standard protocol following bolus administration of intravenous contrast. RADIATION DOSE REDUCTION: This exam was performed according to the departmental dose-optimization program which includes automated exposure control, adjustment of the mA and/or kV according to patient size and/or use of iterative reconstruction technique. CONTRAST:  OMNIPAQUE  IOHEXOL  300 MG/ML  SOLN COMPARISON:  October 16, 2023 FINDINGS: Lower chest: 1.7 cm peripheral nodular opacity at the right lower lobe, new/increased from prior. Hepatobiliary: No definite suspicious liver lesions. Some peripheral focal hypodensities along the falciform ligament likely representing focal  fatty infiltration, stable from multiple prior exams. Status post cholecystectomy. No biliary ductal dilation. Pancreas: Unremarkable. Spleen: Unremarkable. Adrenals/Urinary Tract: Adrenal glands are unremarkable. Status post left nephrectomy. Right parapelvic renal cyst. No suspicious renal masses. No hydronephrosis or nephrolithiasis. Unchanged mild bladder wall thickening. Stomach/Bowel: Sequelae of prior right colectomy. No evidence of bowel obstruction. Scattered colonic diverticulosis with no definite evidence for diverticulitis. Vascular/Lymphatic: Normal caliber aorta. No lymphadenopathy by size criteria. Reproductive: Unremarkable. Other: Multiple peritoneal implants along the anterior abdominal wall again identified, overall similar to prior and measuring up to 1.6 cm (2:48). No free air or free fluid. Musculoskeletal: Unchanged chronic L1 compression fracture. No acute osseous findings. Degenerative changes in the spine. IMPRESSION: 1. New/increasing size of peripheral right lower lobe nodular opacity. Dedicated chest imaging may be helpful to evaluate for additional possible pulmonary metastases as clinically indicated. 2.  Overall similar size of multiple peritoneal nodules. 3.  Unchanged mild chronic bladder wall thickening. Electronically Signed   By: Michaeline Blanch M.D.   On: 11/20/2023 15:46   DG Chest Portable 1 View Result Date: 11/20/2023 EXAM: 1 VIEW(S) XRAY OF THE CHEST 11/20/2023 02:25:43 PM COMPARISON: 10/16/2023 CLINICAL HISTORY: cough. Table formatting from the original note was not included.; Pt caox4 reporting he was told to come to ED from cancer center d/t sudden onset of diaphoresis, pt c/o weakness when not laying down. FINDINGS: LUNGS AND PLEURA: Fiducial marker in right lower lobe. Mild right basilar airspace opacity with associated fiducial marker. No pulmonary edema. No pleural effusion. No pneumothorax. HEART AND MEDIASTINUM: Atherosclerotic calcifications. No acute abnormality  of the cardiac and mediastinal silhouettes. BONES AND SOFT TISSUES: Screw fixation of left humeral head, incompletely imaged. IMPRESSION: 1. No acute findings. 2. Right basilar pulmonary opacity adjacent to fudicial marker.  Unchanged. 3. Atherosclerotic vascular calcifications. Electronically signed by: Waddell Calk MD 11/20/2023 02:46 PM EDT RP Workstation: [id number]     Procedures   Medications Ordered in the ED  ondansetron  (ZOFRAN ) injection 4 mg (4 mg Intravenous Not Given 11/20/23 1515)  lactated ringers  bolus 1,000 mL (1,000 mLs Intravenous New Bag/Given 11/20/23 1514)  iohexol  (OMNIPAQUE ) 300 MG/ML solution 100 mL (100 mLs Intravenous Contrast Given 11/20/23  1526)                                    Medical Decision Making Amount and/or Complexity of Data Reviewed Labs: ordered. Radiology: ordered.  Risk Prescription drug management.   73 year old male history of stage IV colon cancer, CKD, CHF, and atrial fibrillation on Xarelto  presents to the emergency department generalized weakness.   Initial Ddx:  Gastroenteritis, hypovolemia, bowel obstruction, ileus, URI, pneumonia  MDM/Course:  Patient presents to the emergency department with nausea and vomiting and diarrhea.  Also has been having subjective fevers and chills at home.  Had an episode of what I suspect is hypotension and diaphoresis at the cancer center prior to arrival.  Is still sweating some.  No chest pain or shortness of breath currently.  Suspect that the patient may have a viral illness.  However, with his history of colon cancer we will obtain an CT scan to rule out any sort of obstruction that could have formed especially since he is passing less gas today.  Will also obtain EKG and troponin and blood work to evaluate for MI and chest x-ray and COVID and flu for infection.  Upon re-evaluation remained stable.  Signed out to the oncoming physician (Dr Ruthe) awaiting lab work and imaging.  This patient  presents to the ED for concern of complaints listed in HPI, this involves an extensive number of treatment options, and is a complaint that carries with it a high risk of complications and morbidity. Disposition including potential need for admission considered.   Dispo: Pending remainder of workup  Additional history obtained from daughter Records reviewed Outpatient Clinic Notes I have reviewed the patients home medications and made adjustments as needed Social Determinants of health:  Geriatric  Portions of this note were generated with Scientist, clinical (histocompatibility and immunogenetics). Dictation errors may occur despite best attempts at proofreading.     Final diagnoses:  Nausea and vomiting, unspecified vomiting type  Lightheadedness  History of colon cancer    ED Discharge Orders     None          Yolande Lamar BROCKS, MD 11/20/23 [phone]

## 2023-11-21 ENCOUNTER — Encounter: Payer: Self-pay | Admitting: Oncology

## 2023-11-21 ENCOUNTER — Encounter: Payer: Self-pay | Admitting: *Deleted

## 2023-11-21 DIAGNOSIS — R112 Nausea with vomiting, unspecified: Secondary | ICD-10-CM | POA: Diagnosis not present

## 2023-11-21 NOTE — Progress Notes (Signed)
 PATIENT NAVIGATOR PROGRESS NOTE  Name: Brendan Butler Date: 11/21/2023 MRN: [id number]  DOB: 1950/04/25   Reason for visit:  Education  Comments:  Please see education note    Time spent counseling/coordinating care: > 60 minutes

## 2023-11-21 NOTE — Progress Notes (Signed)
 Patient and his daughter attended patient ed session and during review of material pt stated that he did not feel well and would like to go to ER for evaluation. Daughter transported pt to ER for evaluation. Will follow up with phone call to discuss further treatment

## 2023-11-22 ENCOUNTER — Other Ambulatory Visit (HOSPITAL_BASED_OUTPATIENT_CLINIC_OR_DEPARTMENT_OTHER): Payer: Self-pay

## 2023-11-25 ENCOUNTER — Telehealth: Payer: Self-pay | Admitting: Nurse Practitioner

## 2023-11-25 MED ORDER — SODIUM CHLORIDE 0.9 % IV SOLN
2400.0000 mg/m2 | INTRAVENOUS | Status: AC
  Filled 2023-11-25: qty 100

## 2023-11-25 MED ORDER — FLUOROURACIL CHEMO INJECTION 2.5 GM/50ML
400.0000 mg/m2 | Freq: Once | INTRAVENOUS | Status: AC
  Filled 2023-11-25: qty 18

## 2023-11-25 NOTE — Telephone Encounter (Signed)
 PT daughter called; PT has an appt with his PCP and wants to get an opinion with PCP before coming in for txt appts. Will call back to reschedule depending on appt with PCP.

## 2023-11-26 ENCOUNTER — Ambulatory Visit

## 2023-11-26 ENCOUNTER — Inpatient Hospital Stay

## 2023-11-26 ENCOUNTER — Other Ambulatory Visit

## 2023-11-26 ENCOUNTER — Inpatient Hospital Stay: Admitting: Oncology

## 2023-11-26 ENCOUNTER — Ambulatory Visit: Admitting: Nurse Practitioner

## 2023-11-27 DIAGNOSIS — C189 Malignant neoplasm of colon, unspecified: Secondary | ICD-10-CM | POA: Diagnosis not present

## 2023-12-02 DIAGNOSIS — J189 Pneumonia, unspecified organism: Secondary | ICD-10-CM | POA: Diagnosis not present

## 2023-12-02 DIAGNOSIS — C7800 Secondary malignant neoplasm of unspecified lung: Secondary | ICD-10-CM | POA: Diagnosis not present

## 2023-12-02 DIAGNOSIS — C189 Malignant neoplasm of colon, unspecified: Secondary | ICD-10-CM | POA: Diagnosis not present

## 2023-12-03 ENCOUNTER — Encounter: Payer: Self-pay | Admitting: *Deleted

## 2023-12-03 NOTE — Progress Notes (Signed)
 Called and spoke with Brendan Butler regarding his wishes. He stated that he wants to speak with his daughters tonight and call back with a decision regarding treatment after his appt with Dr Husain yesterday. He remains undecided about pursuing treatment. Gave him contact number to call tomorrow, he verbalized that he would call back tomorrow

## 2023-12-04 ENCOUNTER — Encounter: Payer: Self-pay | Admitting: *Deleted

## 2023-12-04 ENCOUNTER — Inpatient Hospital Stay: Attending: Oncology

## 2023-12-04 NOTE — Progress Notes (Signed)
 Completed teaching on FOLFIRI/Avastin and discussed in detail Port placement and infusystem portable pump demonstration. Also enrolled pt in transportation assistance.  We discussed hydration, nutrition and support services. Answered all questions and pt will call with decision on whether to proceed with treatment

## 2023-12-04 NOTE — Progress Notes (Unsigned)
 Completed teaching on FOLFIRI/Avastin and discussed in detail Port placement and infusystem portable pump demonstration. Also enrolled pt in transportation assistance.  We discussed hydration, nutrition and support services. Answered all questions and pt will call with decision on whether to proceed with treatment

## 2023-12-09 ENCOUNTER — Encounter: Payer: Self-pay | Admitting: *Deleted

## 2023-12-09 NOTE — Progress Notes (Signed)
 PATIENT NAVIGATOR PROGRESS NOTE  Name: Brendan Butler Date: 12/09/2023 MRN: [id number]  DOB: 1950/09/01   Reason for visit:  F/U after patient ed session  Comments:  After coming to patient education session and discussion with family, [name] has decided not pursue treatment and would like comfort care per discussion with daughter Hospital doctor.  Dr Autumn informed of decision and he will be called by scheduler to arrange phone visit on 12/10/23 to further discuss his decision and appropriate referrals    Time spent counseling/coordinating care: 45-60 minutes

## 2023-12-10 ENCOUNTER — Inpatient Hospital Stay: Admitting: Oncology

## 2023-12-10 DIAGNOSIS — C189 Malignant neoplasm of colon, unspecified: Secondary | ICD-10-CM

## 2023-12-10 MED ORDER — ONDANSETRON HCL 8 MG PO TABS: 8.0000 mg | ORAL_TABLET | Freq: Three times a day (TID) | ORAL | 1 refills | Status: DC | PRN

## 2023-12-10 MED ORDER — PROCHLORPERAZINE MALEATE 10 MG PO TABS: 10.0000 mg | ORAL_TABLET | Freq: Four times a day (QID) | ORAL | 1 refills | Status: DC | PRN

## 2023-12-10 NOTE — Progress Notes (Deleted)
 [facility]  ONCOLOGY CLINIC PROGRESS NOTE   Patient Care Team: Ransom Other, MD as PCP - General (Internal Medicine) Court Dorn PARAS, MD as PCP - Cardiology (Cardiology)  PATIENT NAME: Brendan Butler   MR#: [id number] DOB: 12-Feb-1951  Date of visit: 12/10/2023   ASSESSMENT & PLAN:   Brendan Butler is a 72 y.o. gentleman with a past medical history of hypertension, dyslipidemia, CAD, diastolic CHF, depression with anxiety, CKD stage III, A-fib on Xarelto , was referred to our clinic for history of colon cancer status post right hemicolectomy in June 2024 (stage II).  He did not establish with oncology clinic at that time despite multiple appointments.  Now with CT scan in August 2025 concerning for omental metastatic disease. [employer] 360 CDx showed K-ras G12D mutation, BRAF G466A mutations.  MSI stable.  No problem-specific Assessment & Plan notes found for this encounter.    Assessment and Plan Assessment & Plan      I reviewed lab results and outside records for this visit and discussed relevant results with the patient. Diagnosis, plan of care and treatment options were also discussed in detail with the patient. Opportunity provided to ask questions and answers provided to his apparent satisfaction. Provided instructions to call our clinic with any problems, questions or concerns prior to return visit. I recommended to continue follow-up with PCP and sub-specialists. He verbalized understanding and agreed with the plan.   NCCN guidelines have been consulted in the planning of this patient's care.  I spent a total of {CHL ONC TIME VISIT - DTPQU:[id number]} during this encounter with the patient including review of chart and various tests results, discussions about plan of care and coordination of care plan.   Chinita Patten, MD  12/10/2023 11:22 AM  [facility] - A DEPT OF [facility] [id number] [place] [address] Dept: [phone] Dept Fax: [phone]    CHIEF COMPLAINT/ REASON FOR VISIT:   ***  Current Treatment:  ***  INTERVAL HISTORY:    Discussed the use of AI scribe software for clinical note transcription with the patient, who gave verbal consent to proceed.  History of Present Illness      ***  I have reviewed the past medical history, past surgical history, social history and family history with the patient and they are unchanged from previous note.  HISTORY OF PRESENT ILLNESS:   ONCOLOGY HISTORY:   ***  Oncology History  Stage IV adenocarcinoma of colon (HCC)  09/27/2022 Initial Diagnosis   Stage IV adenocarcinoma of colon (HCC)   11/12/2023 Cancer Staging   Staging form: Colon and Rectum, AJCC 8th Edition - Pathologic: Stage IVC (pT3, pN0, cM1c) - Signed by Patten Chinita, MD on 11/12/2023 Total positive nodes: 0   11/25/2023 -  Chemotherapy   Patient is on Treatment Plan : COLORECTAL FOLFIRI + Bevacizumab q14d         REVIEW OF SYSTEMS:   Review of Systems - Oncology  All other pertinent systems were reviewed with the patient and are negative.  ALLERGIES: He is allergic to levitra [vardenafil], asa [aspirin ], and lipitor  [atorvastatin ].  MEDICATIONS:  Current Outpatient Medications  Medication Sig Dispense Refill   amiodarone  (PACERONE ) 100 MG tablet Take 1 tablet (100 mg total) by mouth daily. 90 tablet 3   buPROPion  (WELLBUTRIN  XL) 150 MG 24 hr tablet Take 3 tablets (450 mg total) by mouth daily.     Cholecalciferol (  VITAMIN D-3 PO) Take 1 capsule by mouth daily.     dexamethasone  (DECADRON ) 4 MG tablet Take 2 tablets (8 mg total) by mouth daily. Start the day after chemotherapy for 2 days. Take with food. 8 tablet 5   doxycycline  (VIBRAMYCIN ) 100 MG capsule Take 1 capsule (100 mg total) by mouth 2 (two) times daily. 20 capsule 0   empagliflozin  (JARDIANCE ) 10 MG TABS tablet Take 1 tablet (10 mg total) by  mouth daily. 90 tablet 3   fluticasone  (FLONASE ) 50 MCG/ACT nasal spray Place 2 sprays into both nostrils daily.     furosemide  (LASIX ) 40 MG tablet Take 1 tablet (40 mg total) by mouth daily as needed for edema or fluid. (Patient not taking: Reported on 11/12/2023)     lactose free nutrition (BOOST PLUS) LIQD Take 237 mLs by mouth 2 (two) times daily between meals. (Patient taking differently: Take 237 mLs by mouth 2 (two) times daily between meals. Takes when does not feel like eating) 237 mL 0   lidocaine -prilocaine  (EMLA ) cream Apply to affected area once. 30 g 3   loperamide  (IMODIUM ) 2 MG capsule Take 2 tablets by mouth with first loose stool, then 1 tablet with each additional loose stool as needed. Do not exceed 8 tabs in a 24-hour period 60 capsule 3   methocarbamol  (ROBAXIN ) 500 MG tablet TAKE 1 TABLET BY MOUTH TWICE A DAY AS NEEDED FOR 30 DAYS; Duration: 30     Multiple Vitamin (MULTIVITAMIN WITH MINERALS) TABS tablet Take 1 tablet by mouth daily. (Patient not taking: Reported on 11/12/2023) 130 tablet 0   nitroGLYCERIN  (NITROSTAT ) 0.4 MG SL tablet Place 0.4 mg under the tongue every 5 (five) minutes as needed for chest pain. (Patient not taking: Reported on 11/12/2023)     ondansetron  (ZOFRAN ) 8 MG tablet Take 1 tablet (8 mg total) by mouth every 8 (eight) hours as needed for nausea, vomiting or refractory nausea / vomiting. Start on the third day after chemotherapy. 30 tablet 1   ondansetron  (ZOFRAN -ODT) 4 MG disintegrating tablet Take 1 tablet (4 mg total) by mouth every 8 (eight) hours as needed. 20 tablet 0   PARoxetine  (PAXIL ) 40 MG tablet Take 40 mg by mouth daily.     potassium chloride  SA (KLOR-CON  M) 20 MEQ tablet Take 2 tablets (40 mEq total) by mouth 2 (two) times daily for 5 days. (Patient not taking: Reported on 11/12/2023) 20 tablet 0   prasugrel  (EFFIENT ) 10 MG TABS tablet Take 10 mg by mouth daily.     pregabalin  (LYRICA ) 150 MG capsule Take 1 capsule (150 mg total) by mouth 2  (two) times daily.     prochlorperazine  (COMPAZINE ) 10 MG tablet Take 1 tablet (10 mg total) by mouth every 6 (six) hours as needed for nausea or vomiting. 30 tablet 1   QUEtiapine  (SEROQUEL ) 25 MG tablet Take 1 tablet (25 mg total) by mouth at bedtime. 30 tablet 0   Rivaroxaban  (XARELTO ) 15 MG TABS tablet Take 1 tablet (15 mg total) by mouth daily. 30 tablet 0   rosuvastatin  (CRESTOR ) 40 MG tablet Take 1 tablet (40 mg total) by mouth daily. 90 tablet 3   [Paused] sacubitril -valsartan  (ENTRESTO ) 24-26 MG TAKE 1 TABLET TWICE DAILY 180 tablet 0   No current facility-administered medications for this visit.   Facility-Administered Medications Ordered in Other Visits  Medication Dose Route Frequency Provider Last Rate Last Admin   fluorouracil (ADRUCIL) chemo injection 900 mg  400 mg/m2 (Treatment Plan Recorded) Intravenous Once  Hartford Maulden, MD         VITALS:   There were no vitals taken for this visit.  Wt Readings from Last 3 Encounters:  11/12/23 215 lb 1.6 oz (97.6 kg)  11/01/23 212 lb (96.2 kg)  10/16/23 200 lb (90.7 kg)    There is no height or weight on file to calculate BMI.  ***    PHYSICAL EXAM:   Physical Exam   ***  LABORATORY DATA:   I have reviewed the data as listed.  No results found for any visits on 12/10/23.    RADIOGRAPHIC STUDIES:  I have personally reviewed the radiological images as listed and agree with the findings in the report.  CT Angio Chest PE W and/or Wo Contrast Result Date: 11/20/2023 CLINICAL DATA:  Pulmonary embolus suspected with high probability. History of stage IV colon cancer. Generalized weakness. EXAM: CT ANGIOGRAPHY CHEST WITH CONTRAST TECHNIQUE: Multidetector CT imaging of the chest was performed using the standard protocol during bolus administration of intravenous contrast. Multiplanar CT image reconstructions and MIPs were obtained to evaluate the vascular anatomy. RADIATION DOSE REDUCTION: This exam was performed  according to the departmental dose-optimization program which includes automated exposure control, adjustment of the mA and/or kV according to patient size and/or use of iterative reconstruction technique. CONTRAST:  52mL OMNIPAQUE  IOHEXOL  350 MG/ML SOLN COMPARISON:  Chest radiograph 11/20/2023. PET CT 11/06/2022. CT chest 08/22/2022 FINDINGS: Cardiovascular: Technically adequate study with good opacification of the central and segmental pulmonary arteries. Mild motion artifact. No focal filling defects. No evidence of significant pulmonary embolus. Normal heart size. No pericardial effusions. Calcification of the aorta and coronary arteries. No aortic aneurysm. Mediastinum/Nodes: Thyroid  gland is unremarkable. Esophagus is decompressed. Diffuse mediastinal and hilar lymphadenopathy. Lymphadenopathy extends into the supraclavicular region. Largest subcarinal lymph nodes measure about 2.1 cm short axis dimension. Lymphadenopathy has progressed since the previous studies. Lungs/Pleura: Emphysematous changes and scattered fibrosis in the lungs. Mild subpleural infiltrates bilaterally with areas of nodular consolidation in the right lung. There are new pulmonary nodules demonstrated throughout the right lung. Changes may represent multifocal pneumonia but the appearance is worrisome for developing metastatic disease. No pleural effusion or pneumothorax. Upper Abdomen: Surgical absence of the gallbladder. No focal liver lesions are appreciated. Surgical absence of the left kidney. Duodenal diverticula. Musculoskeletal: Degenerative changes in the spine. Old compression deformity of L1, unchanged. No focal bone lesions are demonstrated. Review of the MIP images confirms the above findings. IMPRESSION: 1. No evidence of significant pulmonary embolus. 2. Patchy nodular infiltrative changes throughout the right lung possibly representing pneumonia or developing metastatic disease. 3. New hilar and mediastinal lymphadenopathy  suspicious for developing metastatic disease. 4. Emphysematous changes in the lungs. 5. Aortic atherosclerosis. Electronically Signed   By: Elsie Gravely M.D.   On: 11/20/2023 18:33   CT ABDOMEN PELVIS W CONTRAST Result Date: 11/20/2023 CLINICAL DATA:  Nausea, vomiting, weakness, history of colon cancer EXAM: CT ABDOMEN AND PELVIS WITH CONTRAST TECHNIQUE: Multidetector CT imaging of the abdomen and pelvis was performed using the standard protocol following bolus administration of intravenous contrast. RADIATION DOSE REDUCTION: This exam was performed according to the departmental dose-optimization program which includes automated exposure control, adjustment of the mA and/or kV according to patient size and/or use of iterative reconstruction technique. CONTRAST:  OMNIPAQUE  IOHEXOL  300 MG/ML  SOLN COMPARISON:  October 16, 2023 FINDINGS: Lower chest: 1.7 cm peripheral nodular opacity at the right lower lobe, new/increased from prior. Hepatobiliary: No definite suspicious liver lesions. Some  peripheral focal hypodensities along the falciform ligament likely representing focal fatty infiltration, stable from multiple prior exams. Status post cholecystectomy. No biliary ductal dilation. Pancreas: Unremarkable. Spleen: Unremarkable. Adrenals/Urinary Tract: Adrenal glands are unremarkable. Status post left nephrectomy. Right parapelvic renal cyst. No suspicious renal masses. No hydronephrosis or nephrolithiasis. Unchanged mild bladder wall thickening. Stomach/Bowel: Sequelae of prior right colectomy. No evidence of bowel obstruction. Scattered colonic diverticulosis with no definite evidence for diverticulitis. Vascular/Lymphatic: Normal caliber aorta. No lymphadenopathy by size criteria. Reproductive: Unremarkable. Other: Multiple peritoneal implants along the anterior abdominal wall again identified, overall similar to prior and measuring up to 1.6 cm (2:48). No free air or free fluid. Musculoskeletal:  Unchanged chronic L1 compression fracture. No acute osseous findings. Degenerative changes in the spine. IMPRESSION: 1. New/increasing size of peripheral right lower lobe nodular opacity. Dedicated chest imaging may be helpful to evaluate for additional possible pulmonary metastases as clinically indicated. 2.  Overall similar size of multiple peritoneal nodules. 3.  Unchanged mild chronic bladder wall thickening. Electronically Signed   By: Michaeline Blanch M.D.   On: 11/20/2023 15:46   DG Chest Portable 1 View Result Date: 11/20/2023 EXAM: 1 VIEW(S) XRAY OF THE CHEST 11/20/2023 02:25:43 PM COMPARISON: 10/16/2023 CLINICAL HISTORY: cough. Table formatting from the original note was not included.; Pt caox4 reporting he was told to come to ED from cancer center d/t sudden onset of diaphoresis, pt c/o weakness when not laying down. FINDINGS: LUNGS AND PLEURA: Fiducial marker in right lower lobe. Mild right basilar airspace opacity with associated fiducial marker. No pulmonary edema. No pleural effusion. No pneumothorax. HEART AND MEDIASTINUM: Atherosclerotic calcifications. No acute abnormality of the cardiac and mediastinal silhouettes. BONES AND SOFT TISSUES: Screw fixation of left humeral head, incompletely imaged. IMPRESSION: 1. No acute findings. 2. Right basilar pulmonary opacity adjacent to fudicial marker.  Unchanged. 3. Atherosclerotic vascular calcifications. Electronically signed by: Waddell Calk MD 11/20/2023 02:46 PM EDT RP Workstation: [id number]    ***  CODE STATUS:  Code Status History     Date Active Date Inactive Code Status Order ID Comments User Context   07/25/2023 2036 07/26/2023 2056 Full Code [id number]  Hilma Rankins, MD ED   01/12/2023 2154 01/15/2023 2141 Full Code [id number]  Charlton Evalene RAMAN, MD ED   10/09/2022 1554 10/12/2022 2138 Full Code [id number]  Claudene Maximino LABOR, MD ED   09/27/2022 1658 09/30/2022 2040 Full Code [id number]  Barbarann Nest, MD ED   09/11/2022 0055 09/13/2022 1836 Full  Code [id number]  Charlton Evalene RAMAN, MD ED   08/17/2022 1333 08/28/2022 2137 Full Code [id number]  Seena Marsa NOVAK, MD ED   07/30/2022 1808 07/31/2022 2352 Full Code [id number]  Cindy Garnette POUR, MD ED   05/20/2022 2225 05/29/2022 1659 Full Code [id number]  Dena Charleston, MD ED   05/06/2022 2144 05/11/2022 0023 Full Code [id number]  Laveda Roosevelt, MD ED   12/25/2021 1007 12/28/2021 2333 Full Code [id number]  Barbarann Nest, MD ED   01/16/2021 2024 01/17/2021 2157 Full Code [id number]  Ricky Alfrieda DASEN, DO ED   01/27/2016 1907 01/28/2016 1618 Full Code [id number]  Alfornia Madison, MD Inpatient   04/22/2013 1039 04/23/2013 1717 Full Code [id number]  Burnard Debby LABOR, MD Inpatient   04/21/2013 1533 04/22/2013 1039 Full Code [id number]  Maxie Herlene POUR, PA-C ED   01/05/2013 1508 01/08/2013 1617 Full Code [id number]  Wonda Sharper, MD Inpatient   12/30/2012 0017 01/01/2013 1613 Full Code [id number]  Lillie Herbert, MD Inpatient    Questions for Most Recent Historical  Code Status (Order [id number])     Question Answer   By: Consent: discussion documented in EHR            No orders of the defined types were placed in this encounter.    Future Appointments  Date Time Provider Department Center  01/06/2024  9:45 AM Court Dorn PARAS, MD [facility]      This document was completed utilizing speech recognition software. Grammatical errors, random word insertions, pronoun errors, and incomplete sentences are an occasional consequence of this system due to software limitations, ambient noise, and hardware issues. Any formal questions or concerns about the content, text or information contained within the body of this dictation should be directly addressed to the provider for clarification.

## 2023-12-11 ENCOUNTER — Other Ambulatory Visit

## 2023-12-12 ENCOUNTER — Other Ambulatory Visit: Payer: Self-pay | Admitting: Cardiovascular Disease

## 2023-12-31 ENCOUNTER — Other Ambulatory Visit: Payer: Self-pay

## 2023-12-31 ENCOUNTER — Telehealth: Payer: Self-pay

## 2023-12-31 ENCOUNTER — Encounter: Payer: Self-pay | Admitting: Oncology

## 2023-12-31 ENCOUNTER — Other Ambulatory Visit: Payer: Self-pay | Admitting: Oncology

## 2023-12-31 DIAGNOSIS — C182 Malignant neoplasm of ascending colon: Secondary | ICD-10-CM

## 2023-12-31 DIAGNOSIS — C189 Malignant neoplasm of colon, unspecified: Secondary | ICD-10-CM

## 2023-12-31 MED ORDER — OXYCODONE HCL 10 MG PO TABS: 10.0000 mg | ORAL_TABLET | Freq: Four times a day (QID) | ORAL | 0 refills | Status: DC | PRN

## 2023-12-31 NOTE — Telephone Encounter (Signed)
 Returned patient's daughter's Catering Manager) call regarding her father's abdominal pain of 8/10 that started last evening. Dr. Autumn aware and prescribed a one-time rx of Oxycodone  10mg .   Attempted to call patient and left a voicemail.   Amber expressed interested in Hospice and would like to have hospice appointment set up with her for her father as she will bring his to the appointment. A Hospice Consult was placed as patient is not interested in receiving treatment at this time but needs some symptom management and more.

## 2023-12-31 NOTE — Progress Notes (Signed)
 [facility]  HEMATOLOGY-ONCOLOGY ELECTRONIC VISIT PROGRESS NOTE  PATIENT NAME: Brendan Butler   MR#: [id number] DOB: [birth year]  DATE OF SERVICE: 12/10/2023  Patient Care Team: Ransom Other, MD as PCP - General (Internal Medicine) Court Dorn PARAS, MD as PCP - Cardiology (Cardiology)  I connected with the patient via telephone conference and verified that I am speaking with the correct person using two identifiers. The patient's location is at home and I am providing care from the [facility].  I discussed the limitations, risks, security and privacy concerns of performing an evaluation and management service by e-visits and the availability of in person appointments. I also discussed with the patient that there may be a patient responsible charge related to this service. The patient expressed understanding and agreed to proceed.   ASSESSMENT & PLAN:   Brendan Butler is a 73 y.o. gentleman with a past medical history of hypertension, dyslipidemia, CAD, diastolic CHF, depression with anxiety, CKD stage III, A-fib on Xarelto , was referred to our clinic for history of colon cancer status post right hemicolectomy in June 2024 (stage II).  He did not establish with oncology clinic at that time despite multiple appointments.  Now with CT scan in August 2025 concerning for omental metastatic disease.    Stage IV adenocarcinoma of the colon Progression of metastatic cancer with multiple peritoneal nodules. He has declined chemotherapy treatment. The cancer is expected to worsen without treatment, potentially affecting quality of life and increasing symptom burden.  Cancer-related nausea and vomiting Persistent nausea likely due to cancer progression and possible intestinal involvement. Current antiemetic regimen is not effective. Nausea could be due to cancer causing intestinal blockage or chemical release from cancer cells. Without chemotherapy, nausea symptoms may  persist despite medication. - Send prescriptions for ondansetron  and Compazine  to [facility]. - Instruct to start with ondansetron  and alternate with Compazine  if nausea persists after two hours.  Cancer-related anorexia and weight loss Significant anorexia and weight loss associated with cancer progression.  Patient does not want any medication or intervention for this at this time.  Goals of Care Discussion about the potential need for hospice care due to declining condition and symptom management challenges. He is aware of hospice services but currently declines hospice care, preferring to manage symptoms independently. He desires help with symptom management, particularly nausea, but not with other aspects of care. - Encourage discussion with family, particularly his daughter, about future care needs and hospice services. - Advise to consider hospice care in the future to avoid frequent emergency room visits.   I discussed the assessment and treatment plan with the patient. The patient was provided an opportunity to ask questions and all were answered. The patient agreed with the plan and demonstrated an understanding of the instructions. The patient was advised to call back or seek an in-person evaluation if the symptoms worsen or if the condition fails to improve as anticipated.    I spent 22 minutes over the phone with the patient reviewing test results, discuss management and coordination/planning of care.  Chinita Patten, MD 12/31/2023 2:46 PM [facility] A DEPT OF [facility] [address] [place] [address] Dept: [phone] Dept Fax: [phone]   INTERVAL HISTORY:  Please see above for problem oriented charting.  The purpose of today's discussion is to explain recent lab results and to formulate plan of care.  Discussed the use of AI scribe software  for clinical note transcription with the  patient, who gave verbal consent to proceed.  History of Present Illness He has experienced a significant decrease in appetite, forcing himself to eat despite lacking desire, leading to weight loss. He maintains regular bowel movements, at least once daily, but feels his intake is insufficient. He has noticed seeing a little black stool when wiping, though not consistently.  He experiences nausea and takes ondansetron  as needed, one tablet by mouth twice a day, but finds it ineffective. He recalls being prescribed Compazine  and Zofran  previously, with a few pills remaining, but they do not provide relief.  He has a history of cancer and has previously decided against chemotherapy. He is aware of hospice care, having had family members who received it, but currently declines this option, seeking help only with symptom management, particularly nausea.   ONCOLOGY HISTORY:   CT chest, abdomen and pelvis on 05/20/2022 showed acute, minimally displaced right posterior 11th and 12th rib fractures, 8th and 9th rib fractures.  No acute intra-abdominal or pelvic traumatic injury or other abnormalities were noted at that time.   He was later found to have heme positive stool, iron deficiency anemia.  Underwent EGD and colonoscopy on 08/20/2022 (Dr. Rosalie).  EGD showed evidence of gastritis and a duodenal diverticulum.  No other major abnormalities.  Colonoscopy showed what appeared to be a malignant tumor at the hepatic flexure.  3 small polyps were noted in the sigmoid colon and descending colon.  Diverticulosis was noted.  Biopsies from the hepatic flexure mass showed invasive, well to moderately differentiated adenocarcinoma.  MMR intact.   He underwent right hemicolectomy on 08/23/2022.  Pathology showed invasive moderately differentiated adenocarcinoma, 2.5 cm, involving the ascending colon.  Carcinoma invaded into the pericolonic soft tissue.  Resection margins were negative for carcinoma.  There was no  evidence of lymphovascular or perineural invasion.  21 lymph nodes were removed and were negative for malignancy.  Separate tubular adenoma without high-grade dysplasia was noted. pT3,pN0,Mx.  MSI stable.   CT abdomen and pelvis on 09/10/2022 showed postsurgical changes.  Colonic diverticulosis.  Prior left nephrectomy.  No other evidence of disease.   Patient was referred to oncology clinic and had appointments scheduled with Dr. Cloretta but he did not show up for 3 appointments in August to September 2024.   He was hospitalized in May 2025 with complaints of abdominal pain.  CT of abdomen and pelvis did not show any acute findings at that time.  He was treated for viral gastroenteritis and discharged home.   He later presented to the ED on 10/16/2023 with complaints of nausea and shortness of breath.  CT abdomen and pelvis showed omental metastatic disease and he was referred to us  again for further evaluation.   Stage IVc disease with omental metastatic disease.   We discussed that all treatment options are palliative in nature and not curative in intent.  Patient verbalized understanding.  Recommended systemic chemotherapy with FOLFIRI plus Avastin.    Patient does not want to pursue any cancer directed therapy at this time.  Oncology History  Stage IV adenocarcinoma of colon (HCC)  09/27/2022 Initial Diagnosis   Stage IV adenocarcinoma of colon (HCC)   11/12/2023 Cancer Staging   Staging form: Colon and Rectum, AJCC 8th Edition - Pathologic: Stage IVC (pT3, pN0, cM1c) - Signed by Autumn Millman, MD on 11/12/2023 Total positive nodes: 0   11/25/2023 -  Chemotherapy   Patient is on Treatment Plan : COLORECTAL FOLFIRI +  Bevacizumab q14d       REVIEW OF SYSTEMS:    Review of Systems - Oncology  All other pertinent systems were reviewed with the patient and are negative.  I have reviewed the past medical history, past surgical history, social history and family history with the patient  and they are unchanged from previous note.  ALLERGIES:  He is allergic to levitra [vardenafil], asa [aspirin ], and lipitor  [atorvastatin ].  MEDICATIONS:  Current Outpatient Medications  Medication Sig Dispense Refill   amiodarone  (PACERONE ) 100 MG tablet Take 1 tablet (100 mg total) by mouth daily. 90 tablet 3   buPROPion  (WELLBUTRIN  XL) 150 MG 24 hr tablet Take 3 tablets (450 mg total) by mouth daily.     Cholecalciferol (VITAMIN D-3 PO) Take 1 capsule by mouth daily.     dexamethasone  (DECADRON ) 4 MG tablet Take 2 tablets (8 mg total) by mouth daily. Start the day after chemotherapy for 2 days. Take with food. 8 tablet 5   doxycycline  (VIBRAMYCIN ) 100 MG capsule Take 1 capsule (100 mg total) by mouth 2 (two) times daily. 20 capsule 0   empagliflozin  (JARDIANCE ) 10 MG TABS tablet Take 1 tablet (10 mg total) by mouth daily. 90 tablet 3   fluticasone  (FLONASE ) 50 MCG/ACT nasal spray Place 2 sprays into both nostrils daily.     furosemide  (LASIX ) 40 MG tablet Take 1 tablet (40 mg total) by mouth daily as needed for edema or fluid. (Patient not taking: Reported on 11/12/2023)     lactose free nutrition (BOOST PLUS) LIQD Take 237 mLs by mouth 2 (two) times daily between meals. (Patient taking differently: Take 237 mLs by mouth 2 (two) times daily between meals. Takes when does not feel like eating) 237 mL 0   lidocaine -prilocaine  (EMLA ) cream Apply to affected area once. 30 g 3   loperamide  (IMODIUM ) 2 MG capsule Take 2 tablets by mouth with first loose stool, then 1 tablet with each additional loose stool as needed. Do not exceed 8 tabs in a 24-hour period 60 capsule 3   methocarbamol  (ROBAXIN ) 500 MG tablet TAKE 1 TABLET BY MOUTH TWICE A DAY AS NEEDED FOR 30 DAYS; Duration: 30     Multiple Vitamin (MULTIVITAMIN WITH MINERALS) TABS tablet Take 1 tablet by mouth daily. (Patient not taking: Reported on 11/12/2023) 130 tablet 0   nitroGLYCERIN  (NITROSTAT ) 0.4 MG SL tablet Place 0.4 mg under the tongue  every 5 (five) minutes as needed for chest pain. (Patient not taking: Reported on 11/12/2023)     ondansetron  (ZOFRAN ) 8 MG tablet Take 1 tablet (8 mg total) by mouth every 8 (eight) hours as needed for nausea, vomiting or refractory nausea / vomiting. Start on the third day after chemotherapy. 30 tablet 1   ondansetron  (ZOFRAN -ODT) 4 MG disintegrating tablet Take 1 tablet (4 mg total) by mouth every 8 (eight) hours as needed. 20 tablet 0   PARoxetine  (PAXIL ) 40 MG tablet Take 40 mg by mouth daily.     potassium chloride  SA (KLOR-CON  M) 20 MEQ tablet Take 2 tablets (40 mEq total) by mouth 2 (two) times daily for 5 days. (Patient not taking: Reported on 11/12/2023) 20 tablet 0   prasugrel  (EFFIENT ) 10 MG TABS tablet Take 10 mg by mouth daily.     pregabalin  (LYRICA ) 150 MG capsule Take 1 capsule (150 mg total) by mouth 2 (two) times daily.     prochlorperazine  (COMPAZINE ) 10 MG tablet Take 1 tablet (10 mg total) by mouth every 6 (six) hours  as needed for nausea or vomiting. 30 tablet 1   QUEtiapine  (SEROQUEL ) 25 MG tablet Take 1 tablet (25 mg total) by mouth at bedtime. 30 tablet 0   Rivaroxaban  (XARELTO ) 15 MG TABS tablet Take 1 tablet (15 mg total) by mouth daily. 30 tablet 0   rosuvastatin  (CRESTOR ) 40 MG tablet Take 1 tablet (40 mg total) by mouth daily. 90 tablet 3   [Paused] sacubitril -valsartan  (ENTRESTO ) 24-26 MG TAKE 1 TABLET TWICE DAILY 180 tablet 0   No current facility-administered medications for this visit.   Facility-Administered Medications Ordered in Other Visits  Medication Dose Route Frequency Provider Last Rate Last Admin   fluorouracil (ADRUCIL) chemo injection 900 mg  400 mg/m2 (Treatment Plan Recorded) Intravenous Once Kinnie Kaupp, MD        PHYSICAL EXAMINATION:  Not performed today as it was a phone only visit  LABORATORY DATA:   I have reviewed the data as listed.  Recent Results (from the past 2160 hours)  CBG monitoring, ED     Status: Abnormal   Collection  Time: 10/16/23  4:03 PM  Result Value Ref Range   Glucose-Capillary 102 (H) 70 - 99 mg/dL    Comment: Glucose reference range applies only to samples taken after fasting for at least 8 hours.  CBC with Differential     Status: Abnormal   Collection Time: 10/16/23  4:09 PM  Result Value Ref Range   WBC 16.5 (H) 4.0 - 10.5 K/uL   RBC 5.65 4.22 - 5.81 MIL/uL   Hemoglobin 16.6 13.0 - 17.0 g/dL   HCT 51.7 60.9 - 47.9 %   MCV 85.3 80.0 - 100.0 fL   MCH 29.4 26.0 - 34.0 pg   MCHC 34.4 30.0 - 36.0 g/dL   RDW 87.7 88.4 - 84.4 %   Platelets 335 150 - 400 K/uL   nRBC 0.0 0.0 - 0.2 %   Neutrophils Relative % 66 %   Neutro Abs 10.9 (H) 1.7 - 7.7 K/uL   Lymphocytes Relative 19 %   Lymphs Abs 3.2 0.7 - 4.0 K/uL   Monocytes Relative 12 %   Monocytes Absolute 2.0 (H) 0.1 - 1.0 K/uL   Eosinophils Relative 2 %   Eosinophils Absolute 0.3 0.0 - 0.5 K/uL   Basophils Relative 0 %   Basophils Absolute 0.1 0.0 - 0.1 K/uL   Immature Granulocytes 1 %   Abs Immature Granulocytes 0.08 (H) 0.00 - 0.07 K/uL    Comment: Performed at [facility], [address] [place], [place] [address]  Comprehensive metabolic panel     Status: Abnormal   Collection Time: 10/16/23  4:09 PM  Result Value Ref Range   Sodium 137 135 - 145 mmol/L   Potassium 2.9 (L) 3.5 - 5.1 mmol/L   Chloride 102 98 - 111 mmol/L   CO2 18 (L) 22 - 32 mmol/L   Glucose, Bld 101 (H) 70 - 99 mg/dL    Comment: Glucose reference range applies only to samples taken after fasting for at least 8 hours.   BUN 19 8 - 23 mg/dL   Creatinine, Ser 8.53 (H) 0.61 - 1.24 mg/dL   Calcium  9.0 8.9 - 10.3 mg/dL   Total Protein 7.2 6.5 - 8.1 g/dL   Albumin  3.9 3.5 - 5.0 g/dL   AST 26 15 - 41 U/L   ALT 21 0 - 44 U/L   Alkaline Phosphatase 83 38 - 126 U/L   Total Bilirubin 2.6 (H) 0.0 - 1.2  mg/dL   GFR, Estimated 51 (L) >60 mL/min    Comment: (NOTE) Calculated using the CKD-EPI Creatinine Equation (2021)    Anion gap 17 (H) 5 - 15    Comment:  Performed at [facility], [address] [place], [place] [address]  Lipase, blood     Status: None   Collection Time: 10/16/23  4:09 PM  Result Value Ref Range   Lipase 38 11 - 51 U/L    Comment: Performed at [facility], [address], [place], [place] [address]  Troponin I (High Sensitivity)     Status: Abnormal   Collection Time: 10/16/23  4:09 PM  Result Value Ref Range   Troponin I (High Sensitivity) 43 (H) <18 ng/L    Comment: (NOTE) Elevated high sensitivity troponin I (hsTnI) values and significant  changes across serial measurements may suggest ACS but many other  chronic and acute conditions are known to elevate hsTnI results.  Refer to the Links section for chest pain algorithms and additional  guidance. Performed at [facility], [address] [place], [place] [address]   Troponin I (High Sensitivity)     Status: Abnormal   Collection Time: 10/16/23  8:23 PM  Result Value Ref Range   Troponin I (High Sensitivity) 46 (H) <18 ng/L    Comment: (NOTE) Elevated high sensitivity troponin I (hsTnI) values and significant  changes across serial measurements may suggest ACS but many other  chronic and acute conditions are known to elevate hsTnI results.  Refer to the Links section for chest pain algorithms and additional  guidance. Performed at [facility], [address], [place], [place] [address]   Magnesium      Status: None   Collection Time: 10/16/23  8:23 PM  Result Value Ref Range   Magnesium  2.2 1.7 - 2.4 mg/dL    Comment: Performed at [facility], [address] [place], [place] [address]  Troponin I (High Sensitivity)     Status: Abnormal   Collection Time: 10/17/23 12:10 AM  Result Value Ref Range   Troponin I (High Sensitivity) 46 (H) <18 ng/L    Comment: (NOTE) Elevated high sensitivity troponin I (hsTnI) values and significant  changes across serial measurements may suggest ACS but many other  chronic and acute  conditions are known to elevate hsTnI results.  Refer to the Links section for chest pain algorithms and additional  guidance. Performed at [facility], [address], [place], [place] [address]   Protime-INR     Status: None   Collection Time: 10/17/23 12:10 AM  Result Value Ref Range   Prothrombin Time 13.7 11.4 - 15.2 seconds   INR 1.0 0.8 - 1.2    Comment: (NOTE) INR goal varies based on device and disease states. Performed at [facility], [address] [place], [place] [address]   Brain natriuretic peptide     Status: None   Collection Time: 10/17/23 12:10 AM  Result Value Ref Range   B Natriuretic Peptide 87.2 0.0 - 100.0 pg/mL    Comment: Performed at [facility], [address], [place], [place] [address]  I-Stat CG4 Lactic Acid     Status: Abnormal   Collection Time: 10/17/23 12:17 AM  Result Value Ref Range   Lactic Acid, Venous 2.1 (HH) 0.5 - 1.9 mmol/L   Comment NOTIFIED PHYSICIAN   POC occult blood, ED     Status: None  Collection Time: 10/17/23  2:26 AM  Result Value Ref Range   Fecal Occult Bld NEGATIVE NEGATIVE  I-Stat CG4 Lactic Acid     Status: None   Collection Time: 10/17/23  2:29 AM  Result Value Ref Range   Lactic Acid, Venous 1.5 0.5 - 1.9 mmol/L  CEA (Access)     Status: Abnormal   Collection Time: 11/20/23 11:33 AM  Result Value Ref Range   CEA ([facility]) 8.03 (H) 0.00 - 5.00 ng/mL    Comment: (NOTE) This test was performed using [employer] Coulter's paramagnetic chemiluminescent immunoassay. Values obtained from different assay methods cannot be used interchangeably. Please note that up to 8% of patients who smoke may see values 5.1-10.0 ng/ml and 1% of patients who smoke may see CEA levels >10.0 ng/ml. Performed at [facility], [address] [place], [place] [address]   Ferritin     Status: Abnormal   Collection Time: 11/20/23 11:33 AM  Result Value Ref Range   Ferritin 564 (H) 24 - 336 ng/mL     Comment: Performed at [facility], [address] [place], [place] [address]  Iron and TIBC     Status: None   Collection Time: 11/20/23 11:33 AM  Result Value Ref Range   Iron 60 45 - 182 ug/dL   TIBC 691 749 - 549 ug/dL   Saturation Ratios 20 17.9 - 39.5 %   UIBC 248 ug/dL    Comment: Performed at [facility], [address] [place], [place] [address]  Lactate dehydrogenase     Status: Abnormal   Collection Time: 11/20/23 11:33 AM  Result Value Ref Range   LDH 226 (H) 98 - 192 U/L    Comment: Performed at [facility], [address], [place], [place] [address]  CMP (Cancer Center only)     Status: Abnormal   Collection Time: 11/20/23 11:33 AM  Result Value Ref Range   Sodium 142 135 - 145 mmol/L   Potassium 3.0 (L) 3.5 - 5.1 mmol/L   Chloride 102 98 - 111 mmol/L   CO2 23 22 - 32 mmol/L   Glucose, Bld 119 (H) 70 - 99 mg/dL    Comment: Glucose reference range applies only to samples taken after fasting for at least 8 hours.   BUN 12 8 - 23 mg/dL   Creatinine 8.74 (H) 9.38 - 1.24 mg/dL   Calcium  9.7 8.9 - 10.3 mg/dL   Total Protein 7.7 6.5 - 8.1 g/dL   Albumin  4.5 3.5 - 5.0 g/dL   AST 18 15 - 41 U/L   ALT 9 0 - 44 U/L   Alkaline Phosphatase 109 38 - 126 U/L   Total Bilirubin 1.6 (H) 0.0 - 1.2 mg/dL   GFR, Estimated >39 >39 mL/min    Comment: (NOTE) Calculated using the CKD-EPI Creatinine Equation (2021)    Anion gap 17 (H) 5 - 15    Comment: Performed at [facility], [address] [place], [place] [address]  CBC with Differential (Cancer Center Only)     Status: Abnormal   Collection Time: 11/20/23 11:33 AM  Result Value Ref Range   WBC Count 17.2 (H) 4.0 - 10.5 K/uL    Comment: REPEATED TO VERIFY   RBC 5.73 4.22 - 5.81 MIL/uL   Hemoglobin 16.7 13.0 - 17.0 g/dL   HCT 50.4 60.9 - 47.9 %   MCV 86.4 80.0 - 100.0 fL   MCH 29.1 26.0 - 34.0 pg  MCHC 33.7 30.0 - 36.0 g/dL   RDW 87.0 88.4 - 84.4 %    Platelet Count 290 150 - 400 K/uL   nRBC 0.0 0.0 - 0.2 %   Neutrophils Relative % 62 %   Neutro Abs 10.4 (H) 1.7 - 7.7 K/uL   Lymphocytes Relative 23 %   Lymphs Abs 3.9 0.7 - 4.0 K/uL   Monocytes Relative 11 %   Monocytes Absolute 1.9 (H) 0.1 - 1.0 K/uL   Eosinophils Relative 3 %   Eosinophils Absolute 0.5 0.0 - 0.5 K/uL   Basophils Relative 1 %   Basophils Absolute 0.1 0.0 - 0.1 K/uL   Immature Granulocytes 0 %   Abs Immature Granulocytes 0.06 0.00 - 0.07 K/uL    Comment: Performed at [facility], [address] [place], [place] [address]  CBG monitoring, ED     Status: Abnormal   Collection Time: 11/20/23  1:41 PM  Result Value Ref Range   Glucose-Capillary 114 (H) 70 - 99 mg/dL    Comment: Glucose reference range applies only to samples taken after fasting for at least 8 hours.  Resp panel by RT-PCR (RSV, Flu A&B, Covid) Anterior Nasal Swab     Status: None   Collection Time: 11/20/23  2:38 PM   Specimen: Anterior Nasal Swab  Result Value Ref Range   SARS Coronavirus 2 by RT PCR NEGATIVE NEGATIVE    Comment: (NOTE) SARS-CoV-2 target nucleic acids are NOT DETECTED.  The SARS-CoV-2 RNA is generally detectable in upper respiratory specimens during the acute phase of infection. The lowest concentration of SARS-CoV-2 viral copies this assay can detect is 138 copies/mL. A negative result does not preclude SARS-Cov-2 infection and should not be used as the sole basis for treatment or other patient management decisions. A negative result may occur with  improper specimen collection/handling, submission of specimen other than nasopharyngeal swab, presence of viral mutation(s) within the areas targeted by this assay, and inadequate number of viral copies(<138 copies/mL). A negative result must be combined with clinical observations, patient history, and epidemiological information. The expected result is Negative.  Fact Sheet for Patients:   bloggercourse.com  Fact Sheet for Healthcare Providers:  seriousbroker.it  This test is no t yet approved or cleared by the [place]  FDA and  has been authorized for detection and/or diagnosis of SARS-CoV-2 by FDA under an Emergency Use Authorization (EUA). This EUA will remain  in effect (meaning this test can be used) for the duration of the COVID-19 declaration under Section 564(b)(1) of the Act, 21 U.S.C.section 360bbb-3(b)(1), unless the authorization is terminated  or revoked sooner.       Influenza A by PCR NEGATIVE NEGATIVE   Influenza B by PCR NEGATIVE NEGATIVE    Comment: (NOTE) The Xpert Xpress SARS-CoV-2/FLU/RSV plus assay is intended as an aid in the diagnosis of influenza from Nasopharyngeal swab specimens and should not be used as a sole basis for treatment. Nasal washings and aspirates are unacceptable for Xpert Xpress SARS-CoV-2/FLU/RSV testing.  Fact Sheet for Patients: bloggercourse.com  Fact Sheet for Healthcare Providers: seriousbroker.it  This test is not yet approved or cleared by the [place]  FDA and has been authorized for detection and/or diagnosis of SARS-CoV-2 by FDA under an Emergency Use Authorization (EUA). This EUA will remain in effect (meaning this test can be used) for the duration of the COVID-19 declaration under Section 564(b)(1) of the Act, 21 U.S.C. section 360bbb-3(b)(1), unless the authorization is terminated or revoked.  Resp Syncytial Virus by PCR NEGATIVE NEGATIVE    Comment: (NOTE) Fact Sheet for Patients: bloggercourse.com  Fact Sheet for Healthcare Providers: seriousbroker.it  This test is not yet approved or cleared by the [place]  FDA and has been authorized for detection and/or diagnosis of SARS-CoV-2 by FDA under an Emergency Use Authorization (EUA).  This EUA will remain in effect (meaning this test can be used) for the duration of the COVID-19 declaration under Section 564(b)(1) of the Act, 21 U.S.C. section 360bbb-3(b)(1), unless the authorization is terminated or revoked.  Performed at [facility], [address], [place], [place] [address]   Comprehensive metabolic panel     Status: Abnormal   Collection Time: 11/20/23  3:08 PM  Result Value Ref Range   Sodium 142 135 - 145 mmol/L   Potassium 3.2 (L) 3.5 - 5.1 mmol/L   Chloride 102 98 - 111 mmol/L   CO2 26 22 - 32 mmol/L   Glucose, Bld 100 (H) 70 - 99 mg/dL    Comment: Glucose reference range applies only to samples taken after fasting for at least 8 hours.   BUN 12 8 - 23 mg/dL   Creatinine, Ser 8.76 0.61 - 1.24 mg/dL   Calcium  9.5 8.9 - 10.3 mg/dL   Total Protein 7.2 6.5 - 8.1 g/dL   Albumin  4.4 3.5 - 5.0 g/dL   AST 16 15 - 41 U/L   ALT 9 0 - 44 U/L   Alkaline Phosphatase 100 38 - 126 U/L   Total Bilirubin 1.4 (H) 0.0 - 1.2 mg/dL   GFR, Estimated >39 >39 mL/min    Comment: (NOTE) Calculated using the CKD-EPI Creatinine Equation (2021)    Anion gap 14 5 - 15    Comment: Performed at [facility], [address] [place], [place] [address]  Lipase, blood     Status: None   Collection Time: 11/20/23  3:08 PM  Result Value Ref Range   Lipase 39 11 - 51 U/L    Comment: Performed at [facility], [address] [place], [place] [address]  CBC with Diff     Status: Abnormal   Collection Time: 11/20/23  3:08 PM  Result Value Ref Range   WBC 15.6 (H) 4.0 - 10.5 K/uL    Comment: REPEATED TO VERIFY WHITE COUNT CONFIRMED ON SMEAR    RBC 5.44 4.22 - 5.81 MIL/uL   Hemoglobin 15.8 13.0 - 17.0 g/dL   HCT 53.0 60.9 - 47.9 %   MCV 86.2 80.0 - 100.0 fL   MCH 29.0 26.0 - 34.0 pg   MCHC 33.7 30.0 - 36.0 g/dL   RDW 87.0 88.4 - 84.4 %   Platelets 243 150 - 400 K/uL   nRBC 0.0 0.0 - 0.2 %   Neutrophils Relative % 64  %   Neutro Abs 10.0 (H) 1.7 - 7.7 K/uL   Lymphocytes Relative 22 %   Lymphs Abs 3.4 0.7 - 4.0 K/uL   Monocytes Relative 11 %   Monocytes Absolute 1.7 (H) 0.1 - 1.0 K/uL   Eosinophils Relative 3 %   Eosinophils Absolute 0.4 0.0 - 0.5 K/uL   Basophils Relative 0 %   Basophils Absolute 0.1 0.0 - 0.1 K/uL   Immature Granulocytes 0 %   Abs Immature Granulocytes 0.06 0.00 - 0.07 K/uL    Comment: Performed at [facility], [address], [place], [place] [address]  Troponin T, High Sensitivity     Status: Abnormal  Collection Time: 11/20/23  3:08 PM  Result Value Ref Range   Troponin T High Sensitivity 22 (H) 0 - 19 ng/L    Comment: (NOTE) Biotin concentrations > 1000 ng/mL falsely decrease TnT results.  Serial cardiac troponin measurements are suggested.  Refer to the Links section for chest pain algorithms and additional  guidance. Performed at [facility], [address] [place], [place] [address]   Urinalysis, Routine w reflex microscopic -Urine, Clean Catch     Status: Abnormal   Collection Time: 11/20/23  4:22 PM  Result Value Ref Range   Color, Urine YELLOW YELLOW   APPearance CLEAR CLEAR   Specific Gravity, Urine 1.040 (H) 1.005 - 1.030   pH 6.5 5.0 - 8.0   Glucose, UA >1,000 (A) NEGATIVE mg/dL   Hgb urine dipstick NEGATIVE NEGATIVE   Bilirubin Urine NEGATIVE NEGATIVE   Ketones, ur NEGATIVE NEGATIVE mg/dL   Protein, ur TRACE (A) NEGATIVE mg/dL   Nitrite NEGATIVE NEGATIVE   Leukocytes,Ua NEGATIVE NEGATIVE   RBC / HPF 0-5 0 - 5 RBC/hpf   WBC, UA 0-5 0 - 5 WBC/hpf   Bacteria, UA NONE SEEN NONE SEEN   Squamous Epithelial / HPF 0-5 0 - 5 /HPF    Comment: Performed at [facility], [address] [place], [place] [address]  Troponin T, High Sensitivity     Status: Abnormal   Collection Time: 11/20/23  4:41 PM  Result Value Ref Range   Troponin T High Sensitivity 20 (H) 0 - 19 ng/L    Comment: Marked hemolysis   (NOTE) Biotin concentrations > 1000 ng/mL falsely decrease TnT results.  Serial cardiac troponin measurements are suggested.  Refer to the Links section for chest pain algorithms and additional  guidance. Performed at [facility], [address], [place], [place] [address]      RADIOGRAPHIC STUDIES:  CT Angio Chest PE W and/or Wo Contrast CLINICAL DATA:  Pulmonary embolus suspected with high probability. History of stage IV colon cancer. Generalized weakness.  EXAM: CT ANGIOGRAPHY CHEST WITH CONTRAST  TECHNIQUE: Multidetector CT imaging of the chest was performed using the standard protocol during bolus administration of intravenous contrast. Multiplanar CT image reconstructions and MIPs were obtained to evaluate the vascular anatomy.  RADIATION DOSE REDUCTION: This exam was performed according to the departmental dose-optimization program which includes automated exposure control, adjustment of the mA and/or kV according to patient size and/or use of iterative reconstruction technique.  CONTRAST:  52mL OMNIPAQUE  IOHEXOL  350 MG/ML SOLN  COMPARISON:  Chest radiograph 11/20/2023. PET CT 11/06/2022. CT chest 08/22/2022  FINDINGS: Cardiovascular: Technically adequate study with good opacification of the central and segmental pulmonary arteries. Mild motion artifact. No focal filling defects. No evidence of significant pulmonary embolus. Normal heart size. No pericardial effusions. Calcification of the aorta and coronary arteries. No aortic aneurysm.  Mediastinum/Nodes: Thyroid  gland is unremarkable. Esophagus is decompressed. Diffuse mediastinal and hilar lymphadenopathy. Lymphadenopathy extends into the supraclavicular region. Largest subcarinal lymph nodes measure about 2.1 cm short axis dimension. Lymphadenopathy has progressed since the previous studies.  Lungs/Pleura: Emphysematous changes and scattered fibrosis in the lungs. Mild subpleural  infiltrates bilaterally with areas of nodular consolidation in the right lung. There are new pulmonary nodules demonstrated throughout the right lung. Changes may represent multifocal pneumonia but the appearance is worrisome for developing metastatic disease. No pleural effusion or pneumothorax.  Upper Abdomen: Surgical absence of the gallbladder. No focal liver lesions are appreciated. Surgical absence of the left kidney. Duodenal  diverticula.  Musculoskeletal: Degenerative changes in the spine. Old compression deformity of L1, unchanged. No focal bone lesions are demonstrated.  Review of the MIP images confirms the above findings.  IMPRESSION: 1. No evidence of significant pulmonary embolus. 2. Patchy nodular infiltrative changes throughout the right lung possibly representing pneumonia or developing metastatic disease. 3. New hilar and mediastinal lymphadenopathy suspicious for developing metastatic disease. 4. Emphysematous changes in the lungs. 5. Aortic atherosclerosis.  Electronically Signed   By: Elsie Gravely M.D.   On: 11/20/2023 18:33 CT ABDOMEN PELVIS W CONTRAST CLINICAL DATA:  Nausea, vomiting, weakness, history of colon cancer  EXAM: CT ABDOMEN AND PELVIS WITH CONTRAST  TECHNIQUE: Multidetector CT imaging of the abdomen and pelvis was performed using the standard protocol following bolus administration of intravenous contrast.  RADIATION DOSE REDUCTION: This exam was performed according to the departmental dose-optimization program which includes automated exposure control, adjustment of the mA and/or kV according to patient size and/or use of iterative reconstruction technique.  CONTRAST:  OMNIPAQUE  IOHEXOL  300 MG/ML  SOLN  COMPARISON:  October 16, 2023  FINDINGS: Lower chest: 1.7 cm peripheral nodular opacity at the right lower lobe, new/increased from prior.  Hepatobiliary: No definite suspicious liver lesions. Some peripheral focal  hypodensities along the falciform ligament likely representing focal fatty infiltration, stable from multiple prior exams. Status post cholecystectomy. No biliary ductal dilation.  Pancreas: Unremarkable.  Spleen: Unremarkable.  Adrenals/Urinary Tract: Adrenal glands are unremarkable. Status post left nephrectomy. Right parapelvic renal cyst. No suspicious renal masses. No hydronephrosis or nephrolithiasis. Unchanged mild bladder wall thickening.  Stomach/Bowel: Sequelae of prior right colectomy. No evidence of bowel obstruction. Scattered colonic diverticulosis with no definite evidence for diverticulitis.  Vascular/Lymphatic: Normal caliber aorta. No lymphadenopathy by size criteria.  Reproductive: Unremarkable.  Other: Multiple peritoneal implants along the anterior abdominal wall again identified, overall similar to prior and measuring up to 1.6 cm (2:48). No free air or free fluid.  Musculoskeletal: Unchanged chronic L1 compression fracture. No acute osseous findings. Degenerative changes in the spine.  IMPRESSION: 1. New/increasing size of peripheral right lower lobe nodular opacity. Dedicated chest imaging may be helpful to evaluate for additional possible pulmonary metastases as clinically indicated.  2.  Overall similar size of multiple peritoneal nodules.  3.  Unchanged mild chronic bladder wall thickening.  Electronically Signed   By: Michaeline Blanch M.D.   On: 11/20/2023 15:46 DG Chest Portable 1 View EXAM: 1 VIEW(S) XRAY OF THE CHEST 11/20/2023 02:25:43 PM  COMPARISON: 10/16/2023  CLINICAL HISTORY: cough. Table formatting from the original note was not included.; Pt caox4 reporting he was told to come to ED from cancer center d/t sudden onset of diaphoresis, pt c/o weakness when not laying down.  FINDINGS:  LUNGS AND PLEURA: Fiducial marker in right lower lobe. Mild right basilar airspace opacity with associated fiducial marker. No pulmonary edema.  No pleural effusion. No pneumothorax.  HEART AND MEDIASTINUM: Atherosclerotic calcifications. No acute abnormality of the cardiac and mediastinal silhouettes.  BONES AND SOFT TISSUES: Screw fixation of left humeral head, incompletely imaged.  IMPRESSION: 1. No acute findings. 2. Right basilar pulmonary opacity adjacent to fudicial marker.  Unchanged. 3. Atherosclerotic vascular calcifications.  Electronically signed by: Waddell Calk MD 11/20/2023 02:46 PM EDT RP Workstation: [id number]    No orders of the defined types were placed in this encounter.    Future Appointments  Date Time Provider Department Center  01/06/2024  9:45 AM Court Dorn PARAS, MD [facility]    This  document was completed utilizing speech recognition software. Grammatical errors, random word insertions, pronoun errors, and incomplete sentences are an occasional consequence of this system due to software limitations, ambient noise, and hardware issues. Any formal questions or concerns about the content, text or information contained within the body of this dictation should be directly addressed to the provider for clarification.

## 2024-01-01 ENCOUNTER — Telehealth: Payer: Self-pay

## 2024-01-01 NOTE — Telephone Encounter (Signed)
 Reached out to patient's daughter, Jeoffrey, to explain how hospice services work. Patient does have an initial Hospice visit planned for today. Amber said she was unable to attend but [facility] contact information was provided. Patient is aware to hospice appointment per Ambers report and agrees to the visit.

## 2024-01-06 ENCOUNTER — Ambulatory Visit: Attending: Cardiovascular Disease | Admitting: Cardiovascular Disease

## 2024-01-08 NOTE — Progress Notes (Signed)
 RVP sent to test for URI

## 2024-01-27 DEATH — deceased
